# Patient Record
Sex: Male | Born: 1945
Health system: Southern US, Community
[De-identification: ages and names within clinical notes are randomized; demographics above are authoritative.]

## PROBLEM LIST (undated history)

## (undated) DIAGNOSIS — E785 Hyperlipidemia, unspecified: Secondary | ICD-10-CM

## (undated) DIAGNOSIS — E119 Type 2 diabetes mellitus without complications: Secondary | ICD-10-CM

## (undated) DIAGNOSIS — J432 Centrilobular emphysema: Secondary | ICD-10-CM

## (undated) DIAGNOSIS — H8110 Benign paroxysmal vertigo, unspecified ear: Secondary | ICD-10-CM

## (undated) DIAGNOSIS — Z794 Long term (current) use of insulin: Secondary | ICD-10-CM

## (undated) DIAGNOSIS — I10 Essential (primary) hypertension: Secondary | ICD-10-CM

## (undated) DIAGNOSIS — N451 Epididymitis: Secondary | ICD-10-CM

## (undated) DIAGNOSIS — I639 Cerebral infarction, unspecified: Secondary | ICD-10-CM

## (undated) DIAGNOSIS — F17200 Nicotine dependence, unspecified, uncomplicated: Secondary | ICD-10-CM

## (undated) HISTORY — PX: VASECTOMY: SHX75

## (undated) HISTORY — DX: Long term (current) use of insulin: Z79.4

## (undated) HISTORY — DX: Type 2 diabetes mellitus without complications: E11.9

## (undated) HISTORY — DX: Centrilobular emphysema: J43.2

## (undated) HISTORY — DX: Nicotine dependence, unspecified, uncomplicated: F17.200

## (undated) HISTORY — DX: Epididymitis: N45.1

## (undated) HISTORY — DX: Hyperlipidemia, unspecified: E78.5

## (undated) HISTORY — PX: PILONIDAL CYST EXCISION: SHX744

## (undated) HISTORY — DX: Benign paroxysmal vertigo, unspecified ear: H81.10

## (undated) HISTORY — DX: Cerebral infarction, unspecified: I63.9

---

## 2009-10-18 ENCOUNTER — Encounter: Payer: Self-pay | Admitting: Family Medicine

## 2009-11-09 DIAGNOSIS — I639 Cerebral infarction, unspecified: Secondary | ICD-10-CM

## 2009-11-09 HISTORY — DX: Cerebral infarction, unspecified: I63.9

## 2009-12-08 ENCOUNTER — Encounter: Payer: Self-pay | Admitting: Family Medicine

## 2010-01-31 ENCOUNTER — Ambulatory Visit: Payer: Self-pay | Admitting: Family Medicine

## 2010-01-31 DIAGNOSIS — E785 Hyperlipidemia, unspecified: Secondary | ICD-10-CM

## 2010-01-31 DIAGNOSIS — E119 Type 2 diabetes mellitus without complications: Secondary | ICD-10-CM | POA: Insufficient documentation

## 2010-01-31 DIAGNOSIS — E118 Type 2 diabetes mellitus with unspecified complications: Secondary | ICD-10-CM | POA: Insufficient documentation

## 2010-01-31 DIAGNOSIS — E1169 Type 2 diabetes mellitus with other specified complication: Secondary | ICD-10-CM | POA: Insufficient documentation

## 2010-01-31 DIAGNOSIS — I10 Essential (primary) hypertension: Secondary | ICD-10-CM | POA: Insufficient documentation

## 2010-01-31 DIAGNOSIS — F17200 Nicotine dependence, unspecified, uncomplicated: Secondary | ICD-10-CM | POA: Insufficient documentation

## 2010-01-31 LAB — CONVERTED CEMR LAB: Hgb A1c MFr Bld: 9.1 %

## 2010-02-01 ENCOUNTER — Telehealth: Payer: Self-pay | Admitting: Family Medicine

## 2010-02-28 ENCOUNTER — Ambulatory Visit: Payer: Self-pay | Admitting: Family Medicine

## 2010-02-28 DIAGNOSIS — Z8679 Personal history of other diseases of the circulatory system: Secondary | ICD-10-CM | POA: Insufficient documentation

## 2010-03-22 ENCOUNTER — Ambulatory Visit: Payer: Self-pay | Admitting: Family Medicine

## 2010-03-22 DIAGNOSIS — M67919 Unspecified disorder of synovium and tendon, unspecified shoulder: Secondary | ICD-10-CM | POA: Insufficient documentation

## 2010-03-22 DIAGNOSIS — M719 Bursopathy, unspecified: Secondary | ICD-10-CM

## 2010-04-15 ENCOUNTER — Telehealth: Payer: Self-pay | Admitting: Family Medicine

## 2010-04-18 ENCOUNTER — Encounter
Admission: RE | Admit: 2010-04-18 | Discharge: 2010-04-18 | Payer: Self-pay | Source: Home / Self Care | Admitting: Family Medicine

## 2010-04-18 ENCOUNTER — Ambulatory Visit: Payer: Self-pay | Admitting: Family Medicine

## 2010-04-18 DIAGNOSIS — R05 Cough: Secondary | ICD-10-CM

## 2010-05-20 ENCOUNTER — Ambulatory Visit
Admission: RE | Admit: 2010-05-20 | Discharge: 2010-05-20 | Payer: Self-pay | Source: Home / Self Care | Attending: Family Medicine | Admitting: Family Medicine

## 2010-05-20 DIAGNOSIS — H8110 Benign paroxysmal vertigo, unspecified ear: Secondary | ICD-10-CM | POA: Insufficient documentation

## 2010-05-20 HISTORY — DX: Benign paroxysmal vertigo, unspecified ear: H81.10

## 2010-05-20 LAB — HM DIABETES FOOT EXAM

## 2010-05-31 ENCOUNTER — Encounter: Payer: Self-pay | Admitting: Family Medicine

## 2010-06-04 ENCOUNTER — Ambulatory Visit
Admission: RE | Admit: 2010-06-04 | Discharge: 2010-06-04 | Payer: Self-pay | Source: Home / Self Care | Attending: Family Medicine | Admitting: Family Medicine

## 2010-06-05 ENCOUNTER — Telehealth: Payer: Self-pay | Admitting: Family Medicine

## 2010-06-13 NOTE — Progress Notes (Signed)
Summary: Med List Brought by Patient  Med List Brought by Patient   Imported By: Lanelle Bal 02/11/2010 09:57:27  _____________________________________________________________________  External Attachment:    Type:   Image     Comment:   External Document

## 2010-06-13 NOTE — Progress Notes (Signed)
Summary: Aggrenox  Phone Note Call from Patient Call back at Home Phone [phone]   Caller: Patient Call For: Nani Gasser MD Summary of Call: Aggrenox was not generic form so was gonna cost him 200 dollars. Wanted to know if there was anything else cheaper for a blood thinner. Is only on ASA 325 mg once a day. Please advise. [facility] in [place] Initial call taken by: Kathlene November LPN,  April 15, 2010 3:24 PM  Follow-up for Phone Call        Lets try runnign plavix through again ans see if any cheaper. If too expensive then will just have to stick with the ASA or go onto the drugs website adn see if qualify for help scine no insurance.  Follow-up by: Nani Gasser MD,  April 15, 2010 5:33 PM  Additional Follow-up for Phone Call Additional follow up Details #1::        Pt notified of instructions. Has tried the Plavix and it is too expensive as well. Will stick with the ASA and will check into the web site for assistance Additional Follow-up by: Kathlene November LPN,  April 16, 2010 11:32 AM    New/Updated Medications: PLAVIX 75 MG TABS (CLOPIDOGREL BISULFATE) Take 1 tablet by mouth once a day Prescriptions: PLAVIX 75 MG TABS (CLOPIDOGREL BISULFATE) Take 1 tablet by mouth once a day  #30 x 0   Entered and Authorized by:   Nani Gasser MD   Signed by:   Nani Gasser MD on 04/15/2010   Method used:   Electronically to        [facility] [phone]* (retail)       [address] [place], [place]  [address]       Ph: [phone]       Fax: [phone]   RxID:   [id number]

## 2010-06-13 NOTE — Assessment & Plan Note (Signed)
Summary: 1 MONTH HTN, DM, stroke   Vital Signs:  Patient profile:   65 year old male Height:      73 inches Weight:      220 pounds Pulse rate:   81 / minute BP sitting:   118 / 77  (right arm) Cuff size:   regular  Vitals Entered By: Avon Gully CMA, (AAMA) (February 28, 2010 11:18 AM) CC: f/u BP   CC:  f/u BP.  History of Present Illness: Here to f/u BP. ED has gotten worse in the last 6 months. Dec libido. Says wants to know what could be causing this. Says he is around less stimulating things and thinks that could be contributing.   FEels his affect is more flat since the stroke. More easily fatigued.  Not sure if depressed or not.  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   Taking his metformin two times a day and had added the glucotrol. His sugars are running 120-140 fasting in the AM.  Dong well on teh gipizide with no SE. Dnies any lows.  NO recent infections.   Saw ? Neurology for his stroke.  They had discusssion about reducing his risk of stroke.  Also he cannot afford the plavix so I had given him samples of Aggrenox at the last visit.  He is out of them.  He is hopig to find something generic in addition to his aspirin therapy.   Current Medications (verified): 1)  Metformin Hcl 1000 Mg Tabs (Metformin Hcl) .... One Tablet Two Times A Day 2)  Simvastatin 40 Mg Tabs (Simvastatin) .... One Tablet By Mouth Once A Day 3)  Lisinopril-Hydrochlorothiazide 20-12.5 Mg Tabs (Lisinopril-Hydrochlorothiazide) .... Take 1 Tablet By Mouth Once A Day 4)  Aspirin 325 Mg Tabs (Aspirin) .... Take 1 Tablet By Mouth Once A Day 5)  Glipizide 10 Mg Tabs (Glipizide) .... Take 1 Tablet By Mouth Once A Day  Allergies (verified): No Known Drug Allergies  Comments:  Nurse/Medical Assistant: The patient's medications and allergies were reviewed with the patient and were updated in the Medication and Allergy Lists. Avon Gully CMA, Duncan Dull) (February 28, 2010 11:18 AM)  Physical Exam  General:   Well-developed,well-nourished,in no acute distress; alert,appropriate and cooperative throughout examination   Impression & Recommendations:  Problem # 1:  HYPERTENSION, MILD (ICD-401.1) Much improved today. At goal.  His updated medication list for this problem includes:    Lisinopril-hydrochlorothiazide 20-12.5 Mg Tabs (Lisinopril-hydrochlorothiazide) .[name]... Take 1 tablet by mouth once a day  BP today: 118/77 Prior BP: 144/75 (01/31/2010)  Prior 10 Yr Risk Heart Disease: Not enough information (01/31/2010)  Problem # 2:  DIABETES MELLITUS (ICD-250.00) Dsicussed that his more recent numbers look gret. F/U in 2 months and hopefully A1C will look great. He says he has been much more consistant with taking his meds.  His updated medication list for this problem includes:    Metformin Hcl 1000 Mg Tabs (Metformin hcl) ..... One tablet two times a day    Lisinopril-hydrochlorothiazide 20-12.5 Mg Tabs (Lisinopril-hydrochlorothiazide) .[name]... Take 1 tablet by mouth once a day    Aspirin 325 Mg Tabs (Aspirin) .[name]... Take 1 tablet by mouth once a day    Glipizide 10 Mg Tabs (Glipizide) .[name]... Take 1 tablet by mouth once a day  Reviewed HgBA1c results: 9.1 (01/31/2010)  Problem # 3:  CEREBROVASCULAR ACCIDENT, HX OF (ICD-V12.50) Will send over generic aggrenox and see if affordable or not. Pt will let me now. Did give him more samples  for the next 2 weeks as well.   Complete Medication List: 1)  Metformin Hcl 1000 Mg Tabs (Metformin hcl) .... One tablet two times a day 2)  Simvastatin 40 Mg Tabs (Simvastatin) .... One tablet by mouth once a day 3)  Lisinopril-hydrochlorothiazide 20-12.5 Mg Tabs (Lisinopril-hydrochlorothiazide) .... Take 1 tablet by mouth once a day 4)  Aspirin 325 Mg Tabs (Aspirin) .... Take 1 tablet by mouth once a day 5)  Glipizide 10 Mg Tabs (Glipizide) .... Take 1 tablet by mouth once a day 6)  Aggrenox 25-200 Mg Xr12h-cap (Aspirin-dipyridamole) .... Take 1 tablet by mouth two  times a day  (generic please)  Patient Instructions: 1)  Will send over rx for the generic Aggrenox -blood thinner. Call if too expensive.  2)  Please schedule a follow-up appointment in 2-3 months for the diabetes.  Prescriptions: AGGRENOX 25-200 MG XR12H-CAP (ASPIRIN-DIPYRIDAMOLE) Take 1 tablet by mouth two times a day  (Generic please)  #60 x 3   Entered and Authorized by:   Nani Gasser MD   Signed by:   Nani Gasser MD on 02/28/2010   Method used:   Electronically to        [facility] [phone]* (retail)       [address] [place], [place]  [address]       Ph: [phone]       Fax: [phone]   RxID:   [id number]    Orders Added: 1)  Est. Patient Level III [24401]

## 2010-06-13 NOTE — Assessment & Plan Note (Signed)
Summary: Coughing   Vital Signs:  Patient profile:   65 year old male Height:      73 inches Weight:      229 pounds O2 Sat:      98 % Temp:     98.4 degrees F oral Pulse rate:   79 / minute BP sitting:   166 / 86  (right arm) Cuff size:   regular  Vitals Entered By: Avon Gully CMA, Duncan Dull) (April 18, 2010 1:56 PM) CC: cough x 3 weeks   CC:  cough x 3 weeks.  History of Present Illness: Cough x 3 weeks. Last night was the worst. No fever.  Has a tickle and productive.  No SOB.  Cough is very forceful and feels lightheaded. No ear pain. Mild sinus congestion. No cough or cold medication. +smoker. FElt SOB when layed down on his right side. NO alleivaitn sxs. Not really keeping him awake at night. Says gets bronchitis every 2-3 years. Has had PNA twice.   Current Medications (verified): 1)  Metformin Hcl 1000 Mg Tabs (Metformin Hcl) .... One Tablet Two Times A Day 2)  Simvastatin 40 Mg Tabs (Simvastatin) .... One Tablet By Mouth Once A Day 3)  Lisinopril-Hydrochlorothiazide 20-12.5 Mg Tabs (Lisinopril-Hydrochlorothiazide) .... Take 1 Tablet By Mouth Once A Day 4)  Aspirin 325 Mg Tabs (Aspirin) .... Take 1 Tablet By Mouth Once A Day 5)  Glipizide 10 Mg Tabs (Glipizide) .... Take 1 Tablet By Mouth Once A Day 6)  Plavix 75 Mg Tabs (Clopidogrel Bisulfate) .... Take 1 Tablet By Mouth Once A Day  Allergies (verified): No Known Drug Allergies  Comments:  Nurse/Medical Assistant: The patient's medications and allergies were reviewed with the patient and were updated in the Medication and Allergy Lists. Avon Gully CMA, Duncan Dull) (April 18, 2010 1:57 PM)  Social History: Reviewed history from 01/31/2010 and no changes required. Single.  Lives alone.  Current Smoker Alcohol use-no Drug use-no Regular exercise-no 1 cafffeinated drink per day.   Physical Exam  General:  Well-developed,well-nourished,in no acute distress; alert,appropriate and cooperative throughout  examination Head:  Normocephalic and atraumatic without obvious abnormalities. No apparent alopecia or balding. Eyes:  No corneal or conjunctival inflammation noted. EOMI. Perrla.  Ears:  External ear exam shows no significant lesions or deformities.  Right canal was blocked with cerumen.   Nose:  External nasal examination shows no deformity or inflammation. Nasal mucosa are pink and moist without lesions or exudates. Mouth:  Oral mucosa and oropharynx without lesions or exudates.  Teeth in good repair. Neck:  No deformities, masses, or tenderness noted. Chest Wall:  No deformities, masses, tenderness or gynecomastia noted. Lungs:  Normal respiratory effort, chest expands symmetrically. Left is clear. Right with rhonchi diffusely .No wheezes. Heart:  Normal rate and regular rhythm. S1 and S2 normal without gallop, murmur, click, rub or other extra sounds. Skin:  no rashes.   Cervical Nodes:  No lymphadenopathy noted Psych:  Cognition and judgment appear intact. Alert and cooperative with normal attention span and concentration. No apparent delusions, illusions, hallucinations   Impression & Recommendations:  Problem # 1:  COUGH (ICD-786.2) I am concerned about possible PNA. Will send for CXR and further evaluation. Pulse ox was normal today. Will call him back with the results. Recommend stop smoking.  Orders: T-Chest x-ray, 2 views (71020)  Complete Medication List: 1)  Metformin Hcl 1000 Mg Tabs (Metformin hcl) .... One tablet two times a day 2)  Simvastatin 40 Mg Tabs (  Simvastatin) .... One tablet by mouth once a day 3)  Lisinopril-hydrochlorothiazide 20-12.5 Mg Tabs (Lisinopril-hydrochlorothiazide) .... Take 1 tablet by mouth once a day 4)  Aspirin 325 Mg Tabs (Aspirin) .... Take 1 tablet by mouth once a day 5)  Glipizide 10 Mg Tabs (Glipizide) .... Take 1 tablet by mouth once a day 6)  Plavix 75 Mg Tabs (Clopidogrel bisulfate) .... Take 1 tablet by mouth once a day   Orders  Added: 1)  T-Chest x-ray, 2 views [71020] 2)  Est. Patient Level IV [84132]

## 2010-06-13 NOTE — Progress Notes (Signed)
Summary: meds  ---- Converted from flag ---- ---- 01/31/2010 9:08 PM, Nani Gasser MD wrote: Call pt: A1C was really high. Make sure taking metformin two times a day and sent over 2nd pill to help get sugars down to under 130 fasting.  It is on the $4 list at [facility]. ------------------------------  02/01/10 left message for pt to call back in re  above instructions.acm 9:389/23/11 pt notified.pt wants to know if you ment to give him #90 of the simvastatin and does he take this along with the aggernox? McCrimmon CMA, (AAMA), Sue Lush   September 23, 201112:30 PM Yes, the 90 tabs will last 3 months and saves him money over teh 3 month period.  Yes, take with the aggrenox Linford Arnold MD, Santina Evans.  McCrimmon CMA, Duncan Dull), Sue Lush February 01, 2010 pt notified.acm

## 2010-06-13 NOTE — Assessment & Plan Note (Signed)
Summary: 2 WK F/UP   Vital Signs:  Patient profile:   65 year old male Height:      73 inches Weight:      226 pounds Pulse rate:   92 / minute BP sitting:   119 / 76  (right arm) Cuff size:   regular  Vitals Entered By: Avon Gully CMA, Duncan Dull) (June 04, 2010 1:15 PM) CC: f/u vertigo,not much improvment   Primary Care Provider:  Nani Gasser MD  CC:  f/u vertigo and not much improvment.  History of Present Illness: Plans on restarting the plavix as is able to get it through the drug company. Wants to know if should take the ASA with  Plavix.   Went to the foot center in [place] to deteremine the degree nerve damage.  Waiting for the results.  IT will be about 3 weeks.    Says sugar this am in the 130s but admits still eating some sweets at night.   He would also like to discusse smoking cessation aain. He thinks he would like to start chantix.   still having vertigo moslty at night. Says he can re-created it when turns his head to a certain position.  Says thinks the meclzine may be making him feel worse.   Diabetes Management History:      The patient is a 64 years old male who comes in for evaluation of Type 2 Diabetes Mellitus.  He is checking home blood sugars.    Current Medications (verified): 1)  Metformin Hcl 1000 Mg Tabs (Metformin Hcl) .... One Tablet Two Times A Day 2)  Simvastatin 40 Mg Tabs (Simvastatin) .... One Tablet By Mouth Once A Day 3)  Lisinopril-Hydrochlorothiazide 20-12.5 Mg Tabs (Lisinopril-Hydrochlorothiazide) .... Take 1 Tablet By Mouth Once A Day 4)  Aspirin 325 Mg Tabs (Aspirin) .... Take 1 Tablet By Mouth Once A Day 5)  Glipizide Xl 10 Mg Xr24h-Tab (Glipizide) .... Take 1 Tablet By Mouth Once A Day 6)  Meclizine Hcl 25 Mg Tabs (Meclizine Hcl) .... Take 1 Tablet By Mouth Two Times A Day As Needed Vertigo  Allergies (verified): No Known Drug Allergies  Comments:  Nurse/Medical Assistant: The patient's medications and allergies were  reviewed with the patient and were updated in the Medication and Allergy Lists. Avon Gully CMA, Duncan Dull) (June 04, 2010 1:16 PM)  Social History: Single.  Lives alone. Exercises for 30 min daily.  Current Smoker Alcohol use-no Drug use-no Regular exercise-no 1 cafffeinated drink per day.   Physical Exam  General:  Well-developed,well-nourished,in no acute distress; alert,appropriate and cooperative throughout examination Lungs:  Normal respiratory effort, chest expands symmetrically. Lungs are clear to auscultation, no crackles or wheezes. Heart:  Normal rate and regular rhythm. S1 and S2 normal without gallop, murmur, click, rub or other extra sounds.   Impression & Recommendations:  Problem # 1:  CEREBROVASCULAR ACCIDENT, HX OF (ICD-V12.50) Wants to know if needs to take the ASA with the plavix. Will have to check with his neurologist.    Problem # 2:  TOBACCO ABUSE (ICD-305.1)  Discussed that he would like to start the chantix as well. Discussed SE. Call if any concerns. Recommend call the drug company to see if any help with the price of the medication.   His updated medication list for this problem includes:    Chantix Starting Month Pak 0.5 Mg X 11 & 1 Mg X 42 Tabs (Varenicline tartrate) .[name]... Take as directed with food and water.  Problem # 3:  DIABETES MELLITUS (ICD-250.00) Not due for next A1C but i snow following with podiatry.   His updated medication list for this problem includes:    Metformin Hcl 1000 Mg Tabs (Metformin hcl) ..... One tablet two times a day    Lisinopril-hydrochlorothiazide 20-12.5 Mg Tabs (Lisinopril-hydrochlorothiazide) .[name]... Take 1 tablet by mouth once a day    Aspirin 325 Mg Tabs (Aspirin) .[name]... Take 1 tablet by mouth once a day    Glipizide Xl 10 Mg Xr24h-tab (Glipizide) .[name]... Take 1 tablet by mouth once a day  Problem # 4:  BENIGN POSITIONAL VERTIGO (ICD-386.11) Not improving. Has been taking the meclizine adn feels it is not helping.  I recommeneded sending him for PT for further treatment.  He does not have insurance so can't do this for now. Thought I did recommend stop the meclizine since his sxs are primarly at nigth  His updated medication list for this problem includes:    Meclizine Hcl 25 Mg Tabs (Meclizine hcl) .[name]... Take 1 tablet by mouth two times a day as needed vertigo  Problem # 5:  ROTATOR CUFF SYNDROME, RIGHT (ICD-726.10)  Complete Medication List: 1)  Metformin Hcl 1000 Mg Tabs (Metformin hcl) .... One tablet two times a day 2)  Simvastatin 40 Mg Tabs (Simvastatin) .... One tablet by mouth once a day 3)  Lisinopril-hydrochlorothiazide 20-12.5 Mg Tabs (Lisinopril-hydrochlorothiazide) .... Take 1 tablet by mouth once a day 4)  Aspirin 325 Mg Tabs (Aspirin) .... Take 1 tablet by mouth once a day 5)  Glipizide Xl 10 Mg Xr24h-tab (Glipizide) .... Take 1 tablet by mouth once a day 6)  Meclizine Hcl 25 Mg Tabs (Meclizine hcl) .... Take 1 tablet by mouth two times a day as needed vertigo 7)  Chantix Starting Month Pak 0.5 Mg X 11 & 1 Mg X 42 Tabs (Varenicline tartrate) .... Take as directed with food and water.  Patient Instructions: 1)  Take the Chantix two times a day with food and water.  2)  [email or web address] 3)  Follow up around 08/19/2010 Prescriptions: CHANTIX STARTING MONTH PAK 0.5 MG X 11 & 1 MG X 42 TABS (VARENICLINE TARTRATE) Take as directed with food and water.  #1 pack x 0   Entered and Authorized by:   Nani Gasser MD   Signed by:   Nani Gasser MD on 06/04/2010   Method used:   Electronically to        [facility] [phone]* (retail)       [address] [place], [place]  [address]       Ph: [phone]       Fax: [phone]   RxID:   [id number]    Orders Added: 1)  Est. Patient Level III [27253]

## 2010-06-13 NOTE — Assessment & Plan Note (Signed)
Summary: Rt shoulder pain   Vital Signs:  Patient profile:   65 year old male Height:      73 inches Weight:      224 pounds Pulse rate:   66 / minute BP sitting:   138 / 84  (right arm) Cuff size:   regular  Vitals Entered By: Avon Gully CMA, Duncan Dull) (March 22, 2010 11:44 AM) CC: rt shoulder pain x 6-8 months   CC:  rt shoulder pain x 6-8 months.  History of Present Illness: rt shoulder pain x 6-8 months.  Unable to reach back and raise his arm.  Pain for 6-8 mo.  Has been using an OTC rub and helps some.  Pain is mostly over teh outer shoulder. Can reach behind him.  No pain at rest.  No oral meds for it.  No old injuries to that shoulder.  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  Had an plain Xray that was normal about two years ago.  At that time he was not having as much limited range of motion.  Current Medications (verified): 1)  Metformin Hcl 1000 Mg Tabs (Metformin Hcl) .... One Tablet Two Times A Day 2)  Simvastatin 40 Mg Tabs (Simvastatin) .... One Tablet By Mouth Once A Day 3)  Lisinopril-Hydrochlorothiazide 20-12.5 Mg Tabs (Lisinopril-Hydrochlorothiazide) .... Take 1 Tablet By Mouth Once A Day 4)  Aspirin 325 Mg Tabs (Aspirin) .... Take 1 Tablet By Mouth Once A Day 5)  Glipizide 10 Mg Tabs (Glipizide) .... Take 1 Tablet By Mouth Once A Day 6)  Aggrenox 25-200 Mg Xr12h-Cap (Aspirin-Dipyridamole) .... Take 1 Tablet By Mouth Two Times A Day  (Generic Please)  Allergies (verified): No Known Drug Allergies  Comments:  Nurse/Medical Assistant: The patient's medications and allergies were reviewed with the patient and were updated in the Medication and Allergy Lists. Avon Gully CMA, Duncan Dull) (March 22, 2010 11:47 AM)  Past History:  Past Medical History: Last updated: 01/31/2010 Stroke 11/2009- put on plavix but can't afford it.    Physical Exam  General:  Well-developed,well-nourished,in no acute distress; alert,appropriate and cooperative throughout  examination Msk:  right shoulder with limited range of motion and he is just able to touch his low back and has extension to about 90 degrees.  It is painful to for him to reach across the shoulder.  Left shoulder with completely normal range of motion and strength.  He does have decreased strength in the right shoulder.  He also has a positive empty can test on the right but I am unsure if this may be secondary to his chronic weakness status post stroke.  No significant swelling or erythema of the shoulder joint.  He is mildly tender over the lateral border of the  acromion   Impression & Recommendations:  Problem # 1:  ROTATOR CUFF SYNDROME, RIGHT (ICD-726.10) Assessment New likely rotator cuff syndrome based on his exam today.  Discussed conservative treatment with NSAIDs and home physical therapy.  He does not have insurance to formal physical therapy is not possible at this time.  Follow-up in 3 weeks.  If not improving consider ororthopedic referral for further evaluation. avoid heavy lifting with the shoulder.  Problem # 2:  DIABETES MELLITUS (ICD-250.00) he did have some questions today about his blood sugars.  His fastings in the morning have been running between the 130 71 40s which is still slightly above goal.  Stressed the importance of healthy diet and regular exercise and some weight loss.  He does feel he is getting low before lunch by about midmorning.  He can increase his carb intake in the morning or even a small piece of fruit. His updated medication list for this problem includes:    Metformin Hcl 1000 Mg Tabs (Metformin hcl) ..... One tablet two times a day    Lisinopril-hydrochlorothiazide 20-12.5 Mg Tabs (Lisinopril-hydrochlorothiazide) .[name]... Take 1 tablet by mouth once a day    Aspirin 325 Mg Tabs (Aspirin) .[name]... Take 1 tablet by mouth once a day    Glipizide 10 Mg Tabs (Glipizide) .[name]... Take 1 tablet by mouth once a day  Complete Medication List: 1)  Metformin Hcl 1000 Mg  Tabs (Metformin hcl) .... One tablet two times a day 2)  Simvastatin 40 Mg Tabs (Simvastatin) .... One tablet by mouth once a day 3)  Lisinopril-hydrochlorothiazide 20-12.5 Mg Tabs (Lisinopril-hydrochlorothiazide) .... Take 1 tablet by mouth once a day 4)  Aspirin 325 Mg Tabs (Aspirin) .... Take 1 tablet by mouth once a day 5)  Glipizide 10 Mg Tabs (Glipizide) .... Take 1 tablet by mouth once a day 6)  Aggrenox 25-200 Mg Xr12h-cap (Aspirin-dipyridamole) .... Take 1 tablet by mouth two times a day  (generic please)   Patient Instructions: 1)  Ibuprofen 600mg  by mouth three times a day for one week, then as needed. Make sure take with with foods and water.  Stop immediately if any stomach irritation 2)  Try the home physical therapy.  Follow up in about 3 weeks.    Orders Added: 1)  Est. Patient Level IV [16109]

## 2010-06-13 NOTE — Assessment & Plan Note (Signed)
Summary: DM, Vertigo   Vital Signs:  Patient profile:   65 year old male Height:      73 inches Weight:      227 pounds Pulse rate:   83 / minute BP sitting:   139 / 83  (right arm) Cuff size:   regular  Vitals Entered By: Avon Gully CMA, Duncan Dull) (May 20, 2010 9:25 AM) CC: f/u diabetes CBG Result 228   Primary Care Provider:  Nani Gasser MD  CC:  f/u diabetes.  History of Present Illness: Says he feels dizzy today. He says had 3 bites of a biscuit this AM.  Says ate 3 bites of a bisuits on the way here.  When woke up head was spinning. Worse than has every had it.  Would turn head and it would seem like he was still spinningl.  Slight feeling of nausea.  Yesterday was his birthday and did have ice cream cake. No URI in the last week. No ear pain or pressure.    Diabetes Management History:      The patient is a 65 years old male who comes in for evaluation of DM Type 2.  He states understanding of dietary principles but he is not following the appropriate diet.  Sensory loss is noted.  Self foot exams are being performed.  He is checking home blood sugars.        Frequency of hypoglycemic symptoms are reported to be occasionally.  No hyperglycemic symptoms are reported.  Other comments include: NO regular exericse. .        There are no symptoms to suggest diabetic complications.  Since his last visit, no infections have occurred.  No changes have been made to his treatment plan since last visit.    Current Medications (verified): 1)  Metformin Hcl 1000 Mg Tabs (Metformin Hcl) .... One Tablet Two Times A Day 2)  Simvastatin 40 Mg Tabs (Simvastatin) .... One Tablet By Mouth Once A Day 3)  Lisinopril-Hydrochlorothiazide 20-12.5 Mg Tabs (Lisinopril-Hydrochlorothiazide) .... Take 1 Tablet By Mouth Once A Day 4)  Aspirin 325 Mg Tabs (Aspirin) .... Take 1 Tablet By Mouth Once A Day 5)  Glipizide 10 Mg Tabs (Glipizide) .... Take 1 Tablet By Mouth Once A Day 6)  Proair Hfa  108 (90 Base) Mcg/act Aers (Albuterol Sulfate) .... 2 Puffs Inhaled Every 4-6 Hours As Needed Shortness of Breath.  Allergies (verified): No Known Drug Allergies  Comments:  Nurse/Medical Assistant: The patient's medications and allergies were reviewed with the patient and were updated in the Medication and Allergy Lists. Avon Gully CMA, Duncan Dull) (May 20, 2010 9:26 AM)  Social History: Reviewed history from 01/31/2010 and no changes required. Single.  Lives alone.  Current Smoker Alcohol use-no Drug use-no Regular exercise-no 1 cafffeinated drink per day.   Physical Exam  General:  Well-developed,well-nourished,in no acute distress; alert,appropriate and cooperative throughout examination Head:  Normocephalic and atraumatic without obvious abnormalities. No apparent alopecia or balding. Eyes:  No corneal or conjunctival inflammation noted. EOMI. Perrla.  Ears:  External ear exam shows no significant lesions or deformities.  Otoscopic examination reveals clear canals, tympanic membranes are intact bilaterally without bulging, retraction, inflammation or discharge. Hearing is grossly normal bilaterally. Nose:  External nasal examination shows no deformity or inflammation. Nasal mucosa are pink and moist without lesions or exudates. Mouth:  Oral mucosa and oropharynx without lesions or exudates.  Teeth in good repair. Neck:  No deformities, masses, or tenderness noted. Lungs:  Normal  respiratory effort, chest expands symmetrically. Lungs are clear to auscultation, no crackles or wheezes. Heart:  Normal rate and regular rhythm. S1 and S2 normal without gallop, murmur, click, rub or other extra sounds. Neurologic:  alert & oriented X3, cranial nerves II-XII intact, gait normal, and DTRs symmetrical and normal.  + Diks-Halpike to the left.    Diabetes Management Exam:    Foot Exam (with socks and/or shoes not present):       Sensory-Monofilament:          Left foot: normal           Right foot: normal   Impression & Recommendations:  Problem # 1:  DIABETES MELLITUS (ICD-250.00)  Improved but not yet at goal. We discussed starting insulin or adjusting his diet. He says he knows he could really do a much better job on his diet.  Also says when takes the glipizide will get a low in about 4 hours later so has been trying to change the time he takes the dose.   Pt agrees to really work on his eatting habits.  F/U in one month.. Will chagne the gliipizide to the ER so hopefully prvent lows. Needs to bring in meter with him. Due for microalbumin.  Due for eye exam but doesn't have insurance.  His updated medication list for this problem includes:    Metformin Hcl 1000 Mg Tabs (Metformin hcl) ..... One tablet two times a day    Lisinopril-hydrochlorothiazide 20-12.5 Mg Tabs (Lisinopril-hydrochlorothiazide) .[name]... Take 1 tablet by mouth once a day    Aspirin 325 Mg Tabs (Aspirin) .[name]... Take 1 tablet by mouth once a day    Glipizide Xl 10 Mg Xr24h-tab (Glipizide) .[name]... Take 1 tablet by mouth once a day  Orders: Fingerstick (36416) Hemoglobin A1C (83036) Glucose, (CBG) (19147)  Problem # 2:  BENIGN POSITIONAL VERTIGO (ICD-386.11) Assessment: New Discussed dx and given H.O. Reviewed exercises. Given rx for meclizine for sxs relief. If not improving in 2-3 weeks then needs f/u.  His updated medication list for this problem includes:    Meclizine Hcl 25 Mg Tabs (Meclizine hcl) .[name]... Take 1 tablet by mouth two times a day as needed vertigo  Complete Medication List: 1)  Metformin Hcl 1000 Mg Tabs (Metformin hcl) .... One tablet two times a day 2)  Simvastatin 40 Mg Tabs (Simvastatin) .... One tablet by mouth once a day 3)  Lisinopril-hydrochlorothiazide 20-12.5 Mg Tabs (Lisinopril-hydrochlorothiazide) .... Take 1 tablet by mouth once a day 4)  Aspirin 325 Mg Tabs (Aspirin) .... Take 1 tablet by mouth once a day 5)  Glipizide Xl 10 Mg Xr24h-tab (Glipizide) .... Take 1 tablet  by mouth once a day 6)  Proair Hfa 108 (90 Base) Mcg/act Aers (Albuterol sulfate) .... 2 puffs inhaled every 4-6 hours as needed shortness of breath. 7)  Meclizine Hcl 25 Mg Tabs (Meclizine hcl) .... Take 1 tablet by mouth two times a day as needed vertigo   Patient Instructions: 1)  Please schedule a follow-up appointment in 1 months with glucometer.  2)  I changed your glipizide to the extended release. 3)  Start your exercises today for your vertigo.  4)  Follow up in 2 weeks if you are not better.   Prescriptions: GLIPIZIDE XL 10 MG XR24H-TAB (GLIPIZIDE) Take 1 tablet by mouth once a day  #30 x 1   Entered and Authorized by:   Nani Gasser MD   Signed by:   Nani Gasser MD on 05/20/2010  Method used:   Electronically to        [facility] [phone]* (retail)       [address] [place], [place]  [address]       Ph: [phone]       Fax: [phone]   RxID:   [id number] MECLIZINE HCL 25 MG TABS (MECLIZINE HCL) Take 1 tablet by mouth two times a day as needed vertigo  #40 x 0   Entered and Authorized by:   Nani Gasser MD   Signed by:   Nani Gasser MD on 05/20/2010   Method used:   Electronically to        [facility] [phone]* (retail)       [address] [place], [place]  [address]       Ph: [phone]       Fax: [phone]   RxID:   [id number]    Orders Added: 1)  Fingerstick [36416] 2)  Hemoglobin A1C [83036] 3)  Glucose, (CBG) [82962] 4)  Est. Patient Level IV [10932]    Laboratory Results   Blood Tests   Date/Time Received: 05/20/09 Date/Time Reported: 05/20/09  HGBA1C: 7.5%   (Normal Range: Non-Diabetic - 3-6%   Control Diabetic - 6-8%) CBG Random:: 228mg /dL         Contraindications/Deferment of Procedures/Staging:    Test/Procedure: FLU VAX    Reason for deferment: patient declined

## 2010-06-13 NOTE — Assessment & Plan Note (Signed)
Summary: NOV: HTN, lipids, DM   Vital Signs:  Patient profile:   65 year old male Height:      73 inches Weight:      224 pounds BMI:     29.66 Pulse rate:   80 / minute BP sitting:   144 / 75  (right arm) Cuff size:   regular  Vitals Entered By: Avon Gully CMA, Duncan Dull) (January 31, 2010 2:03 PM) CC: NP est care--needs med refilled, Hypertension Management   CC:  NP est care--needs med refilled and Hypertension Management.  History of Present Illness: Dx with DM 8 years ago.  Then in the [employer] trial. Was able to get off his meds for about a year. Then about a year ago had elevated sugars. Also dx wiht HTN about a year ago. Had  a stroke in july.  Right leg is weaker by about 50%, right arm strength dec by 10-20%. Still gets some numbness in that right arm. Had been on daily ASA therapy when he had the stroke.  They added plavix but he is unable to afford it.  Last A1C in teh 7 range in July. Admits he almost never remembers his second dose of metformin and admits he does't eat well exercise.  Says he has never had an A1C below 7.0. No recent CP or SOB.   Right shoulder pain for months. Had a normal  xray. Painful to reach back or reach above his head. Pain is centered over the lateral shoulder.    Diabetes Management History:      The patient is a 65 years old male who comes in for evaluation of DM Type 2.  He states understanding of dietary principles and is following his diet appropriately.        Hypoglycemic symptoms are not occurring.  Frequency of hyperglycemic symptoms are reported to be occasionally.  Other comments include: Sugars 15-190s. Says often forgets his evening dose of teh metformin. .        There are no symptoms to suggest diabetic complications.  No changes have been made to his treatment plan since last visit.    Hypertension History:      He denies headache, chest pain, palpitations, dyspnea with exertion, orthopnea, PND, peripheral edema, visual symptoms,  neurologic problems, syncope, and side effects from treatment.  Monday Bp wasin the 170s. .        Positive major cardiovascular risk factors include male age 43 years old or older, diabetes, hyperlipidemia, hypertension, and current tobacco user.     Habits & Providers  Alcohol-Tobacco-Diet     Alcohol drinks/day: 0     Tobacco Status: current     Cigarette Packs/Day: 1.0     Pack years: 45 years  Exercise-Depression-Behavior     STD Risk: never     Drug Use: no  Current Medications (verified): 1)  Metformin Hcl 1000 Mg Tabs (Metformin Hcl) .... One Tablet Two Times A Day 2)  Simvastatin 40 Mg Tabs (Simvastatin) .... One Tablet By Mouth Once A Day 3)  Lisinopril 10 Mg Tabs (Lisinopril) .... One Tablet By Mouth Once Aday  Allergies (verified): No Known Drug Allergies  Comments:  Nurse/Medical Assistant: The patient's medications and allergies were reviewed with the patient and were updated in the Medication and Allergy Lists. Avon Gully CMA, Duncan Dull) (January 31, 2010 2:08 PM)  Past History:  Past Medical History: Stroke 11/2009- put on plavix but can't afford it.    Past Surgical History: Pilonidal cyst  [id number]  Family History: DM  Social History: Single.  Lives alone.  Current Smoker Alcohol use-no Drug use-no Regular exercise-no 1 cafffeinated drink per day.  Smoking Status:  current Packs/Day:  1.0 STD Risk:  never Drug Use:  no  Review of Systems       No fever/sweats/weakness, unexplained weight loss/gain.  No vison changes.  No difficulty hearing/ringing in ears, hay fever/allergies.  No chest pain/discomfort, palpitations.  No Br lump/nipple discharge.  No cough/wheeze.  No blood in BM, nausea/vomiting/diarrhea.  No nighttime urination, leaking urine, unusual vaginal bleeding, discharge (penis or vagina).  No muscle/joint pain. No rash, change in mole.  No HA, memory loss.  No anxiety, sleep d/o, depression.  No easy bruising/bleeding, unexplained  lump   Physical Exam  General:  Well-developed,well-nourished,in no acute distress; alert,appropriate and cooperative throughout examination Head:  Normocephalic and atraumatic without obvious abnormalities. No apparent alopecia or balding. Neck:  No deformities, masses, or tenderness noted. No TM.  Lungs:  Normal respiratory effort, chest expands symmetrically. Lungs are clear to auscultation, no crackles or wheezes. Heart:  Normal rate and regular rhythm. S1 and S2 normal without gallop, murmur, click, rub or other extra sounds. No carotid bruits .  Pulses:  Radial 2+  Skin:  color normal.   Cervical Nodes:  No lymphadenopathy noted Psych:  Cognition and judgment appear intact. Alert and cooperative with normal attention span and concentration. No apparent delusions, illusions, hallucinations   Impression & Recommendations:  Problem # 1:  HYPERTENSION, MILD (ICD-401.1) Change to lisinopril with HCT.  Recheck in 1 month. Call if any concerns.  His updated medication list for this problem includes:    Lisinopril-hydrochlorothiazide 20-12.5 Mg Tabs (Lisinopril-hydrochlorothiazide) .[name]... Take 1 tablet by mouth once a day  BP today: 144/75  10 Yr Risk Heart Disease: Not enough information  Problem # 2:  HYPERLIPIDEMIA (ICD-272.4) Says had labs done in June. Will check his papers.   His updated medication list for this problem includes:    Simvastatin 40 Mg Tabs (Simvastatin) ..... One tablet by mouth once a day  Problem # 3:  DIABETES MELLITUS (ICD-250.00)  Not well controlled clearly.  Rec change to metformin XL but he really wanted to try adn make an effort to take his two times a day dose. Will add glipizide becuase of cost issues.  Also strongly enc weight loss, exercise, and healthy consistant diet. Continue daily ASA Will get labs to see what he is due for.  Declined flu vac secondary to no insurance.  His updated medication list for this problem includes:    Metformin Hcl  1000 Mg Tabs (Metformin hcl) ..... One tablet two times a day    Lisinopril-hydrochlorothiazide 20-12.5 Mg Tabs (Lisinopril-hydrochlorothiazide) .[name]... Take 1 tablet by mouth once a day    Aspirin 325 Mg Tabs (Aspirin) .[name]... Take 1 tablet by mouth once a day    Glipizide 10 Mg Tabs (Glipizide) .[name]... Take 1 tablet by mouth once a day  Orders: Fingerstick (36416) Hgb A1C (47829FA)  Reviewed HgBA1c results: 9.1 (01/31/2010)  Problem # 4:  TOBACCO ABUSE (ICD-305.1) Contemplative stage.   Complete Medication List: 1)  Metformin Hcl 1000 Mg Tabs (Metformin hcl) .... One tablet two times a day 2)  Simvastatin 40 Mg Tabs (Simvastatin) .... One tablet by mouth once a day 3)  Lisinopril-hydrochlorothiazide 20-12.5 Mg Tabs (Lisinopril-hydrochlorothiazide) .... Take 1 tablet by mouth once a day 4)  Aspirin 325 Mg Tabs (Aspirin) .... Take 1 tablet by mouth once  a day 5)  Glipizide 10 Mg Tabs (Glipizide) .... Take 1 tablet by mouth once a day  Hypertension Assessment/Plan:      The patient's hypertensive risk group is category C: Target organ damage and/or diabetes.  Today's blood pressure is 144/75.    Patient Instructions: 1)  Take your metformin two times a day and make sure getting regular exercise daily. 2)  Followup in one month 3)  I encourage you to get the flu shot 4)  I changed your BP medication. See below.  5)  Take the Aggrenox samples. oNe a day. Hold your Aspirin as it already has aspirin in it.  Prescriptions: GLIPIZIDE 10 MG TABS (GLIPIZIDE) Take 1 tablet by mouth once a day  #30 x 2   Entered and Authorized by:   Nani Gasser MD   Signed by:   Nani Gasser MD on 02/01/2010   Method used:   Electronically to        [facility] [phone]* (retail)       [address] [place], [place]  [address]       Ph: [phone]       Fax: [phone]   RxID:   [id number] LISINOPRIL-HYDROCHLOROTHIAZIDE 20-12.5 MG TABS (LISINOPRIL-HYDROCHLOROTHIAZIDE) Take 1 tablet  by mouth once a day  #30 x 1   Entered and Authorized by:   Nani Gasser MD   Signed by:   Nani Gasser MD on 01/31/2010   Method used:   Electronically to        [facility] [phone]* (retail)       [address] [place], [place]  [address]       Ph: [phone]       Fax: [phone]   RxID:   [id number] SIMVASTATIN 40 MG TABS (SIMVASTATIN) one tablet by mouth once a day  #90 x 3   Entered and Authorized by:   Nani Gasser MD   Signed by:   Nani Gasser MD on 01/31/2010   Method used:   Electronically to        [facility] [phone]* (retail)       [address] [place], [place]  [address]       Ph: [phone]       Fax: [phone]   RxID:   [id number] METFORMIN HCL 1000 MG TABS (METFORMIN HCL) one tablet two times a day  #60 x 3   Entered and Authorized by:   Nani Gasser MD   Signed by:   Nani Gasser MD on 01/31/2010   Method used:   Electronically to        [facility] [phone]* (retail)       [address] [place], [place]  [address]       Ph: [phone]       Fax: [phone]   RxID:   [id number]    Laboratory Results   Blood Tests   Date/Time Received: 01/31/10 Date/Time Reported: 01/31/10  HGBA1C: 9.1%   (Normal Range: Non-Diabetic - 3-6%   Control Diabetic - 6-8%)         Immunization History:  Pneumovax Immunization History:    Pneumovax:  historical (03/15/2009)  Influenza Immunization History:    Influenza:  historical (04/14/2009)

## 2010-06-19 NOTE — Progress Notes (Signed)
Summary: Plavix  ---- Converted from flag ---- ---- 06/04/2010 3:48 PM, Nani Gasser MD wrote: Call his neurologis and see if needs ASA + Plavix. He is gong to be able to finally get the plavix throught the drug company ------------------------------  06/05/10 9:54 acm called pt to ask who his neuro was an d he will call me back with this info  10:36 AM June 07, 2010 McCrimmon CMA, Duncan Dull), Sue Lush called and left a message and asked that they call back  [phone] Brantley Persons neurologist that this pt sees  9:02 AM June 11, 2010 McCrimmon CMA, Duncan Dull), Sue Lush called and discharge physician was Hassan Buckler, MD, Marylu Lund the nurse will call me back  10:36 AM June 11, 2010 McCrimmon CMA, Duncan Dull), Sue Lush per Dr. Hassan Buckler pt does not need to be on asa, just plavix   January 31, 201212:36 PM [facility]. Call pt and let him know stop the ASA and just take the plavix. Linford Arnold MD, Keiandre Cygan  1:36 PM June 11, 2010 McCrimmon CMA, Duncan Dull), Sue Lush pt notified

## 2010-06-27 NOTE — Letter (Signed)
Summary: [facility]  [facility]   Imported By: Maryln Gottron 06/19/2010 14:40:47  _____________________________________________________________________  External Attachment:    Type:   Image     Comment:   External Document

## 2010-07-16 ENCOUNTER — Encounter: Payer: Self-pay | Admitting: Family Medicine

## 2010-07-30 ENCOUNTER — Telehealth: Payer: Self-pay | Admitting: Family Medicine

## 2010-08-05 ENCOUNTER — Other Ambulatory Visit: Payer: Self-pay | Admitting: Family Medicine

## 2010-08-08 ENCOUNTER — Other Ambulatory Visit: Payer: Self-pay | Admitting: *Deleted

## 2010-08-08 DIAGNOSIS — E119 Type 2 diabetes mellitus without complications: Secondary | ICD-10-CM

## 2010-08-08 MED ORDER — GLUCOSE BLOOD VI STRP: ORAL_STRIP | Status: DC

## 2010-08-19 ENCOUNTER — Ambulatory Visit: Payer: Self-pay | Admitting: Family Medicine

## 2010-08-20 ENCOUNTER — Encounter: Payer: Self-pay | Admitting: Family Medicine

## 2010-08-20 ENCOUNTER — Ambulatory Visit (INDEPENDENT_AMBULATORY_CARE_PROVIDER_SITE_OTHER): Payer: Self-pay | Admitting: Family Medicine

## 2010-08-20 ENCOUNTER — Telehealth: Payer: Self-pay | Admitting: Family Medicine

## 2010-08-20 DIAGNOSIS — E119 Type 2 diabetes mellitus without complications: Secondary | ICD-10-CM

## 2010-08-20 LAB — POCT UA - MICROALBUMIN

## 2010-08-20 NOTE — Assessment & Plan Note (Addendum)
Not much improvemen in A1Ct.  Discussed he really needs to start injection either insulin byetta, etc. He really doesn't want to do this.  Asked him to f/u in 3 months.  Microalbumin was neg today.   Lab Results  Component Value Date   HGBA1C 7.4 08/20/2010

## 2010-08-20 NOTE — Telephone Encounter (Signed)
Continue use work on weight loss and regular exercise and your diet.

## 2010-08-20 NOTE — Patient Instructions (Addendum)
  Work on weight loss, exercise, and diet Fasting sugar in the morning before breakfast.

## 2010-08-20 NOTE — Progress Notes (Signed)
  Subjective:    Patient ID: Jon [name], male    DOB: Sep 17, 1945, 65 y.o.   MRN: [id number]  HPI Says he still needs to work on his diet.  Still eating sweets. He has not been checking his sugars fasting. Since her last time.  Has numbness in both legs.  Saw podiatry who recommended a neurology referral.  He says he can't afford this at this time. He did bring a copy of the muscle bx report with him to scan in .   S/o residual weakness from his stroke.  He is still smoking.    Review of Systems     Objective:   Physical Exam  Constitutional: He appears well-developed and well-nourished.  HENT:  Head: Normocephalic.  Cardiovascular: Normal rate, regular rhythm and normal heart sounds.        No carotid bruits.   Pulmonary/Chest: Effort normal and breath sounds normal.  Musculoskeletal: He exhibits no edema.          Assessment & Plan:

## 2010-08-21 NOTE — Telephone Encounter (Signed)
Left message on vm

## 2010-09-04 ENCOUNTER — Telehealth: Payer: Self-pay | Admitting: Family Medicine

## 2010-09-04 LAB — COMPLETE METABOLIC PANEL WITH GFR
AST: 14 U/L (ref 0–37)
Albumin: 4.5 g/dL (ref 3.5–5.2)
BUN: 14 mg/dL (ref 6–23)
CO2: 27 mEq/L (ref 19–32)
Calcium: 8.6 mg/dL (ref 8.4–10.5)
Creat: 1.33 mg/dL (ref 0.40–1.50)
GFR, Est African American: >60 mL/min (ref >60)
GFR, Est Non African American: 54 mL/min — ABNORMAL LOW (ref >60)
Glucose, Bld: 128 mg/dL — ABNORMAL HIGH (ref 70–99)
Potassium: 4.7 mEq/L (ref 3.5–5.3)
Sodium: 139 mEq/L (ref 135–145)

## 2010-09-04 LAB — LIPID PANEL
HDL: 34 mg/dL — ABNORMAL LOW (ref >39)
Total CHOL/HDL Ratio: 4.3 Ratio
Triglycerides: 293 mg/dL — ABNORMAL HIGH (ref <150)

## 2010-09-04 NOTE — Telephone Encounter (Signed)
LDL looks great but the triglycerides are still elevated. If he can take fish oil like him to start taking 3 for official times a day if he cannot please let me know and we can add any other prescription medication to help lower this.

## 2010-09-04 NOTE — Telephone Encounter (Signed)
Pt.notified

## 2010-09-11 ENCOUNTER — Other Ambulatory Visit: Payer: Self-pay | Admitting: *Deleted

## 2010-09-11 ENCOUNTER — Telehealth: Payer: Self-pay | Admitting: Family Medicine

## 2010-09-11 MED ORDER — GLIPIZIDE 10 MG PO TABS: 10.0000 mg | ORAL_TABLET | ORAL | Status: DC

## 2010-09-11 NOTE — Telephone Encounter (Signed)
Pt called wanting to know what can he take OTC for pain?  Is Tylenol okay?  Needs for arthritis hurting him really bad.  Please advise. Plan:  Routed to provider. Jon Newcomer, LPN Domingo Dimes

## 2010-09-12 ENCOUNTER — Other Ambulatory Visit: Payer: Self-pay | Admitting: Family Medicine

## 2010-09-12 MED ORDER — GLIPIZIDE 10 MG PO TABS: 10.0000 mg | ORAL_TABLET | Freq: Every day | ORAL | Status: DC

## 2010-09-12 NOTE — Telephone Encounter (Signed)
Yes, can take Tyelnol arthritis for pain relief of joints.

## 2010-09-12 NOTE — Telephone Encounter (Signed)
Pt notified to try OTC Tylenol arthritis for his arthritic pain.  Wanted to know if glucosamine was okay, and I told him it is also for the joints but more on the vitamin side, but that it should be okay too. PLAN:  Pt informed, voiced understanding. Jon Newcomer, LPN Domingo Dimes

## 2010-11-16 ENCOUNTER — Other Ambulatory Visit: Payer: Self-pay | Admitting: Family Medicine

## 2010-11-17 NOTE — Telephone Encounter (Signed)
Pt needs an appt since due for 3 mth follow up diabetes NOW

## 2010-11-26 ENCOUNTER — Encounter: Payer: Self-pay | Admitting: Family Medicine

## 2010-11-26 ENCOUNTER — Ambulatory Visit (INDEPENDENT_AMBULATORY_CARE_PROVIDER_SITE_OTHER): Payer: Self-pay | Admitting: Family Medicine

## 2010-11-26 VITALS — BP 100/66 | HR 79 | Ht 74.0 in | Wt 221.0 lb

## 2010-11-26 DIAGNOSIS — I10 Essential (primary) hypertension: Secondary | ICD-10-CM

## 2010-11-26 DIAGNOSIS — E119 Type 2 diabetes mellitus without complications: Secondary | ICD-10-CM

## 2010-11-26 DIAGNOSIS — Z1211 Encounter for screening for malignant neoplasm of colon: Secondary | ICD-10-CM

## 2010-11-26 LAB — POCT UA - MICROALBUMIN
Albumin/Creatinine Ratio, Urine, POC: <30
Microalbumin Ur, POC: 10 mg/dL

## 2010-11-26 LAB — POCT GLYCOSYLATED HEMOGLOBIN (HGB A1C): Hemoglobin A1C: 6.5

## 2010-11-26 MED ORDER — LISINOPRIL 10 MG PO TABS: 10.0000 mg | ORAL_TABLET | Freq: Every day | ORAL | Status: DC

## 2010-11-26 NOTE — Progress Notes (Signed)
  Subjective:    Patient ID: Jon [name], male    DOB: 11-21-1945, 65 y.o.   MRN: [id number]  Hypertension This is a chronic problem. The current episode started more than 1 year ago. The problem is controlled. Pertinent negatives include no chest pain or shortness of breath. (Fatigue, dizzy ) There are no associated agents to hypertension. Risk factors for coronary artery disease include no known risk factors. Past treatments include ACE inhibitors and diuretics. The current treatment provides moderate improvement. There are no compliance problems.   Diabetes He presents for his follow-up diabetic visit. He has type 2 diabetes mellitus. His disease course has been improving. Associated symptoms include weakness. Pertinent negatives for diabetes include no chest pain. There are no hypoglycemic complications. Symptoms are stable. There are no diabetic complications. Current diabetic treatment includes oral agent (monotherapy). He is compliant with treatment all of the time. His weight is stable. His breakfast blood glucose range is generally 110-130 mg/dl.   he says he still occasionally eats too many sweets but says overall his pain control and is a little better. He has been too fatigued and has not got any real exercise lately.    Review of Systems  Respiratory: Negative for shortness of breath.   Cardiovascular: Negative for chest pain.  Neurological: Positive for weakness.       Objective:   Physical Exam  Constitutional: He is oriented to person, place, and time. He appears well-developed and well-nourished.  HENT:  Head: Normocephalic.  Cardiovascular: Normal rate, regular rhythm and normal heart sounds.   Pulmonary/Chest: Effort normal and breath sounds normal.  Neurological: He is alert and oriented to person, place, and time.  Skin: Skin is warm.  Psychiatric: He has a normal mood and affect. His behavior is normal.          Assessment & Plan:  Fatigue-I think this could  be coming from his low blood pressure. We will decrease his medications and I want him to followup in one month for repeat blood pressure check. If it is still at that point and we can check blood work to rule out other causes. Next  Colon cancer screening-discussed that he really needs a colonoscopy. He has never had one. He does have some assistance at [facility] with his finances so we will try to schedule at [facility].

## 2010-11-26 NOTE — Assessment & Plan Note (Addendum)
A1C looks fantastic today. Continue the metformin.  I think placed on a better job with his diet. I would still like to get him exercising more regularly. Followup in 3 months

## 2010-11-26 NOTE — Patient Instructions (Addendum)
We will get your scheduled for a colonoscopy at [facility].  Follow up in 1 month to repeat your blood pressure.

## 2010-11-26 NOTE — Assessment & Plan Note (Signed)
Blood pressure is actually low today so I'm going to decrease his medication. Will recheck in one month.

## 2010-12-02 ENCOUNTER — Ambulatory Visit (INDEPENDENT_AMBULATORY_CARE_PROVIDER_SITE_OTHER): Payer: Self-pay | Admitting: Family Medicine

## 2010-12-02 ENCOUNTER — Telehealth: Payer: Self-pay | Admitting: *Deleted

## 2010-12-02 VITALS — BP 110/69 | HR 67

## 2010-12-02 DIAGNOSIS — I10 Essential (primary) hypertension: Secondary | ICD-10-CM

## 2010-12-02 NOTE — Telephone Encounter (Signed)
Pt notified of Dr. Vernie Ammons

## 2010-12-02 NOTE — Progress Notes (Signed)
  Subjective:    Patient ID: Jon [name], male    DOB: [birth year], 65 y.o.   MRN: [id number]  HPI  Pt denies chest pain, SOB, dizziness, or heart palpitations.  Taking meds as directed w/o problems.  Denies medication side effects.  5 min spent with pt.    Review of Systems     Objective:   Physical Exam        Assessment & Plan:  HTN- BP looks great today. Continue current regimen. I think the decrease on the med is working well. Keep followup for diabetes

## 2010-12-27 ENCOUNTER — Encounter: Payer: Self-pay | Admitting: Family Medicine

## 2010-12-27 ENCOUNTER — Ambulatory Visit (INDEPENDENT_AMBULATORY_CARE_PROVIDER_SITE_OTHER): Payer: Self-pay | Admitting: Family Medicine

## 2010-12-27 VITALS — BP 123/71 | HR 71 | Wt 225.0 lb

## 2010-12-27 DIAGNOSIS — R21 Rash and other nonspecific skin eruption: Secondary | ICD-10-CM

## 2010-12-27 DIAGNOSIS — Z872 Personal history of diseases of the skin and subcutaneous tissue: Secondary | ICD-10-CM

## 2010-12-27 DIAGNOSIS — I10 Essential (primary) hypertension: Secondary | ICD-10-CM

## 2010-12-27 MED ORDER — NYSTATIN 100000 UNIT/GM EX OINT: TOPICAL_OINTMENT | Freq: Two times a day (BID) | CUTANEOUS | Status: DC

## 2010-12-27 NOTE — Progress Notes (Signed)
  Subjective:    Patient ID: Jon [name], male    DOB: December 31, 1945, 65 y.o.   MRN: [id number]  HPI He is having pain over the top part of the products along the crease. No drainage that he is noticed. It is very tender it has been getting worse this week. It started about a week ago. No fevers or chills. Hx of pilonidal cyst about 50 years ago.  In fact he had surgery for excision in 1960, 61, 62, 63.  He did well for 50 years until this past week. He has not been using any topical treatments.  No alleviating symptoms. Touching it or sitting back on something makes it worse  HTN- 05 and has hypertension. He has been taking his medications regularly. He brought his own machine and brought in to compare to ours. His blood pressures have been running him in the 150s at home. In fact here today is running in the 150s compared to a blood pressure check of 123/71.  Review of Systems     Objective:   Physical Exam  Constitutional: He appears well-developed and well-nourished.  HENT:  Head: Normocephalic and atraumatic.  Cardiovascular: Normal rate, regular rhythm and normal heart sounds.   Pulmonary/Chest: Effort normal and breath sounds normal.  Musculoskeletal: He exhibits no edema.  Skin:       Along the upper part of the buttock crease he has erythematous well-demarcated rash with some ulcerated areas towards the center. I am unable to identify any actual pleural or cyst opening. No active drainage. There is significant induration. It is tender to touch.          Assessment & Plan:  Rash- I recommend treating with topical nystatin ointment at this point in time. This also will work as a barrier ointment. I will make a referral to Gen. surgery for next weeks to examine the area to make sure it's not a recurrence of his pilonidal cyst. If the pain and rash is completely resolved by next week then we can cancel the appointment.

## 2010-12-27 NOTE — Assessment & Plan Note (Signed)
His blood pressure looks fantastic today. Unfortunately his new cuff that he bought is not working well. I recommend that since he still has received a ticket back to the pharmacy and get his money back and she is a different machine. He is welcome to come in any time and check his machine against others to make sure it is accurate. We will continue his current regimen and I will see him back in  6 months.

## 2010-12-27 NOTE — Patient Instructions (Signed)
Blood pressure looks great today Apply the nystatin cream and we will schedule you with the surgeon for next week.  If it completely resolves then we can cancel the appointment.   Can schedule nurse visit to bring in new BP cuff.  Your BP looks great today.

## 2011-01-01 ENCOUNTER — Other Ambulatory Visit: Payer: Self-pay | Admitting: Family Medicine

## 2011-01-07 ENCOUNTER — Other Ambulatory Visit: Payer: Self-pay | Admitting: Family Medicine

## 2011-01-07 ENCOUNTER — Ambulatory Visit (INDEPENDENT_AMBULATORY_CARE_PROVIDER_SITE_OTHER): Payer: Self-pay | Admitting: Surgery

## 2011-01-07 MED ORDER — CLOPIDOGREL BISULFATE 75 MG PO TABS: 75.0000 mg | ORAL_TABLET | Freq: Every day | ORAL | Status: DC

## 2011-01-07 NOTE — Telephone Encounter (Signed)
Pt called in and said we forgot to give him a script for his plavix 75 mg.  Plan:  Chart file reviewed.  The order states once.  Verified with Dr. Linford Arnold that this is suppose to read plavix 75 mg PO daily.  Verified and okay to send for daily use.  Pt informed that the script was sent. Jon Newcomer, LPN [facility]

## 2011-02-03 ENCOUNTER — Ambulatory Visit (INDEPENDENT_AMBULATORY_CARE_PROVIDER_SITE_OTHER): Payer: Self-pay | Admitting: Family Medicine

## 2011-02-03 DIAGNOSIS — I10 Essential (primary) hypertension: Secondary | ICD-10-CM

## 2011-02-03 NOTE — Progress Notes (Signed)
  Subjective:    Patient ID: Jon [name], male    DOB: 1945/11/28, 65 y.o.   MRN: [id number] Pt here for nurse BP check. No complaints of chest pain, dizziness. Pt taking new meds as directed.  HPI    Review of Systems     Objective:   Physical Exam        Assessment & Plan:  HTN- BP looks great. meds working well. Keep her regular followup appointment.

## 2011-02-25 NOTE — Telephone Encounter (Signed)
Error- jr

## 2011-03-06 ENCOUNTER — Ambulatory Visit (INDEPENDENT_AMBULATORY_CARE_PROVIDER_SITE_OTHER): Payer: Self-pay | Admitting: Family Medicine

## 2011-03-06 ENCOUNTER — Encounter: Payer: Self-pay | Admitting: Family Medicine

## 2011-03-06 DIAGNOSIS — R209 Unspecified disturbances of skin sensation: Secondary | ICD-10-CM

## 2011-03-06 DIAGNOSIS — E119 Type 2 diabetes mellitus without complications: Secondary | ICD-10-CM

## 2011-03-06 DIAGNOSIS — L821 Other seborrheic keratosis: Secondary | ICD-10-CM

## 2011-03-06 DIAGNOSIS — Z23 Encounter for immunization: Secondary | ICD-10-CM

## 2011-03-06 DIAGNOSIS — R200 Anesthesia of skin: Secondary | ICD-10-CM

## 2011-03-06 LAB — POCT GLYCOSYLATED HEMOGLOBIN (HGB A1C): Hemoglobin A1C: 6.8

## 2011-03-06 NOTE — Progress Notes (Signed)
  Subjective:    Patient ID: Jon [name], male    DOB: [birth year], 65 y.o.   MRN: [id number]  Diabetes He presents for his follow-up diabetic visit. He has type 2 diabetes mellitus. His disease course has been stable. (Says occ drops low if skips eating ) Associated symptoms include foot paresthesias. Pertinent negatives for diabetes include no chest pain, no foot ulcerations, no polydipsia, no polyphagia and no polyuria. Symptoms are stable. Current diabetic treatments: He has not been eating well.  He is compliant with treatment all of the time. His weight is stable. He has not had a previous visit with a dietician. (Hasn't checked his sugar in 2 weeks. ) An ACE inhibitor/angiotensin II receptor blocker is being taken. He does not see a podiatrist.  Mole on the upper back that burns occassionally.Has been there for years. Started burning bout 6 mo ago. He also has a lesion in his right facial cheek. It is Prindle and says he often makes it while shaving his beard.  Review of Systems  Cardiovascular: Negative for chest pain.  Genitourinary: Negative for polyuria.  Hematological: Negative for polydipsia and polyphagia.       Objective:   Physical Exam  Constitutional: He is oriented to person, place, and time. He appears well-developed and well-nourished.  HENT:  Head: Normocephalic and atraumatic.  Cardiovascular: Normal rate, regular rhythm, normal heart sounds and intact distal pulses.   Pulmonary/Chest: Effort normal and breath sounds normal.  Musculoskeletal:       Left great toe with normal range of motion strength is 5 over 5. Capillary refill is 3 seconds. There are no lesions or breaks in skin or rashes.  Neurological: He is alert and oriented to person, place, and time.  Skin: Skin is warm and dry.       He has a symmetrical benign-appearing flesh-colored mole on his upper back near his neck. It's approximately 0.3 cm in size. There is no evidence of irritation around the lesion.  He also has what appears to be in a regular Fragoso slightly raised seborrheic keratosis on the right facial cheek. No irritation or bleeding.  Psychiatric: He has a normal mood and affect. His behavior is normal.          Assessment & Plan:  DM - A1C was 6.8. He is at goal. I still encouraged him to work on his diet which he admits is not well controlled right now. We will continue his current regimen of medication. I did encourage him to at least check his sugar is not feeling well.   He plans to reschedule his colonscopy.   Left great toe numbness-I refer him to podiatry for further evaluation. This could be early neuropathy possibly related to diabetes, but I like to have him further evaluated to make sure that there is not something more mature skeletal or anatomical that is causing some nerve pain or pressure in the great toe.  Seborrheic keratosis-cryotherapy was performed. Patient tolerated well. The lesion recurs and consider further biopsy. I also gave them reassurance about the flesh-colored papular lesion on his upper back. I suspect that since his naris color it likely get irritated which causes the stinging type sensation. If this persists or gets worse or changes in size then I would recommend biopsy for further evaluation.

## 2011-03-06 NOTE — Patient Instructions (Signed)
Keep up the good work, but continue to really work on [employer].

## 2011-03-21 ENCOUNTER — Emergency Department
Admission: EM | Admit: 2011-03-21 | Discharge: 2011-03-21 | Disposition: A | Discharge status: Home / Self Care | Payer: Self-pay | Source: Home / Self Care | Attending: Family Medicine | Admitting: Family Medicine

## 2011-03-21 ENCOUNTER — Encounter: Payer: Self-pay | Admitting: *Deleted

## 2011-03-21 DIAGNOSIS — H9209 Otalgia, unspecified ear: Secondary | ICD-10-CM

## 2011-03-21 DIAGNOSIS — J029 Acute pharyngitis, unspecified: Secondary | ICD-10-CM

## 2011-03-21 DIAGNOSIS — H9202 Otalgia, left ear: Secondary | ICD-10-CM

## 2011-03-21 HISTORY — DX: Essential (primary) hypertension: I10

## 2011-03-21 LAB — POCT CBC W AUTO DIFF (K'VILLE URGENT CARE)

## 2011-03-21 LAB — POCT RAPID STREP A (OFFICE): Rapid Strep A Screen: NEGATIVE

## 2011-03-21 MED ORDER — AMOXICILLIN 875 MG PO TABS: 875.0000 mg | ORAL_TABLET | Freq: Two times a day (BID) | ORAL | Status: AC

## 2011-03-21 NOTE — ED Notes (Signed)
Patient c/o left ear pain since Monday. The pain has moved around to the left side of his neck/throat.

## 2011-03-21 NOTE — ED Provider Notes (Signed)
History     CSN: [id number] Arrival date & time: 03/21/2011  2:18 PM   First MD Initiated Contact with Patient 03/21/11 1418      Chief Complaint  Patient presents with  . Otalgia      HPI Comments: Patient notes that he developed soreness in his left nose, without swelling, that migrated to his left cheek and ear.  No nasal congestion or cough  Patient is a 65 y.o. male presenting with ear pain and pharyngitis. The history is provided by the patient.  Otalgia This is a new problem. The current episode started more than 2 days ago. There is pain in the left ear. The problem occurs constantly. The problem has been gradually worsening. There has been no fever. The pain is at a severity of 8/10. Associated symptoms include sore throat and neck pain. Pertinent negatives include no ear discharge, no hearing loss, no rhinorrhea, no cough and no rash.  Sore Throat This is a new problem. The current episode started 3 to 5 hours ago. The problem occurs constantly. The problem has been gradually worsening. The symptoms are aggravated by swallowing. The symptoms are relieved by nothing.    Past Medical History  Diagnosis Date  . Stroke 7/11    put on plavix but can't afford it  . Epididymitis   . Diabetes mellitus   . Hypertension     Past Surgical History  Procedure Date  . Pilonidal cyst excision 1960, 61, 62, 63  . Vasectomy     Family History  Problem Relation Age of Onset  . Diabetes Other     family hx of    History  Substance Use Topics  . Smoking status: Current Everyday Smoker    Types: Cigarettes  . Smokeless tobacco: Not on file  . Alcohol Use: No      Review of Systems  Constitutional: Negative.   HENT: Positive for ear pain, sore throat and neck pain. Negative for hearing loss, congestion, rhinorrhea, mouth sores, trouble swallowing, sinus pressure and ear discharge.   Eyes: Negative.   Respiratory: Negative.  Negative for cough.   Cardiovascular: Negative.    Gastrointestinal: Negative.   Genitourinary: Negative.   Musculoskeletal: Negative for arthralgias.  Skin: Negative for rash.  Neurological: Negative for dizziness.    Allergies  Review of patient's allergies indicates no known allergies.  Home Medications   Current Outpatient Rx  Name Route Sig Dispense Refill  . CLOPIDOGREL BISULFATE 75 MG PO TABS Oral Take 1 tablet (75 mg total) by mouth daily. 30 tablet 2  . GLIPIZIDE 10 MG PO TABS Oral Take 1 tablet (10 mg total) by mouth daily. 30 tablet 2  . GLUCOSE BLOOD VI STRP  Use as directed bid to check blood sugar 100 each 3  . LISINOPRIL 10 MG PO TABS Oral Take 1 tablet (10 mg total) by mouth daily. 30 tablet 3  . METFORMIN HCL 1000 MG PO TABS  TAKE ONE TABLET BY MOUTH TWICE DAILY 60 tablet 3  . NYSTATIN 100000 UNIT/GM EX OINT Topical Apply topically 2 (two) times daily. 30 g 0  . SIMVASTATIN 40 MG PO TABS Oral Take 40 mg by mouth daily.        BP 129/73  Pulse 84  Temp(Src) 98.6 F (37 C) (Oral)  Resp 16  Ht 6\' 2"  (1.88 m)  Wt 227 lb (102.967 kg)  BMI 29.15 kg/m2  SpO2 98%  Physical Exam  Constitutional: He appears well-developed and well-nourished. No  distress.  HENT:  Head: Normocephalic and atraumatic.  Right Ear: Tympanic membrane and external ear normal. No drainage or tenderness. No middle ear effusion.  Left Ear: Tympanic membrane normal. There is tenderness.  Mouth/Throat: Oropharynx is clear and moist.       Right canal has cerumen present, not completely blocking the canal. Left auditory meatus is tender with insertion of speculum, but no discharge present.  Cerumen is present, but able to visualize tympanic membrane   Eyes: Conjunctivae are normal. Pupils are equal, round, and reactive to light. Right eye exhibits no discharge. Left eye exhibits no discharge.  Neck: Neck supple.       Tender anterior cervical nodes  Cardiovascular: Normal heart sounds.   Pulmonary/Chest: Breath sounds normal.    Lymphadenopathy:    He has cervical adenopathy.  Skin: Skin is warm and dry.    ED Course  Procedures     Labs Reviewed  POCT RAPID STREP A (OFFICE):  Negative  POCT CBC W AUTO DIFF ([facility]):  CBC:  WBC 9.5 LY 24.7; MO 4.4; GR 70.9; Hgb 15.8       1. Acute pharyngitis   2. Otalgia of left ear       MDM  Suspect early viral URI.  Left ear pain may also represent TMJ inflammation Throat culture pending. Treat symptomatically for now:  Increase fluid intake, begin expectorant, topical decongestant.  Add cough suppressant at bedtime if cough develops. May take Tylenol for pain.  If not improving 5 days begin amoxicillin (given Rx to hold).  Followup with PCP if not improving 7 to 10 days.         Donna Christen, MD 03/21/11 [phone]

## 2011-03-31 ENCOUNTER — Other Ambulatory Visit: Payer: Self-pay | Admitting: Family Medicine

## 2011-05-19 ENCOUNTER — Other Ambulatory Visit: Payer: Self-pay | Admitting: Family Medicine

## 2011-06-03 ENCOUNTER — Other Ambulatory Visit: Payer: Self-pay | Admitting: *Deleted

## 2011-06-03 ENCOUNTER — Other Ambulatory Visit: Payer: Self-pay | Admitting: Family Medicine

## 2011-06-03 MED ORDER — SIMVASTATIN 40 MG PO TABS: 40.0000 mg | ORAL_TABLET | Freq: Every day | ORAL | Status: DC

## 2011-06-04 ENCOUNTER — Other Ambulatory Visit: Payer: Self-pay | Admitting: Family Medicine

## 2011-06-05 ENCOUNTER — Ambulatory Visit (INDEPENDENT_AMBULATORY_CARE_PROVIDER_SITE_OTHER): Payer: Medicare Other | Admitting: Family Medicine

## 2011-06-05 ENCOUNTER — Encounter: Payer: Self-pay | Admitting: Family Medicine

## 2011-06-05 DIAGNOSIS — E1142 Type 2 diabetes mellitus with diabetic polyneuropathy: Secondary | ICD-10-CM | POA: Insufficient documentation

## 2011-06-05 DIAGNOSIS — Z1211 Encounter for screening for malignant neoplasm of colon: Secondary | ICD-10-CM

## 2011-06-05 DIAGNOSIS — E1149 Type 2 diabetes mellitus with other diabetic neurological complication: Secondary | ICD-10-CM

## 2011-06-05 DIAGNOSIS — I10 Essential (primary) hypertension: Secondary | ICD-10-CM

## 2011-06-05 DIAGNOSIS — E119 Type 2 diabetes mellitus without complications: Secondary | ICD-10-CM

## 2011-06-05 DIAGNOSIS — G609 Hereditary and idiopathic neuropathy, unspecified: Secondary | ICD-10-CM

## 2011-06-05 LAB — POCT GLYCOSYLATED HEMOGLOBIN (HGB A1C): Hemoglobin A1C: 7.7

## 2011-06-05 MED ORDER — LISINOPRIL 20 MG PO TABS: 20.0000 mg | ORAL_TABLET | Freq: Every day | ORAL | Status: DC

## 2011-06-05 MED ORDER — AMBULATORY NON FORMULARY MEDICATION: Status: DC

## 2011-06-05 NOTE — Patient Instructions (Addendum)
Diabetes and Exercise Regular exercise is important and can help:    Control blood glucose (sugar).     Decrease blood pressure.     Control blood lipids (cholesterol, triglycerides).     Improve overall health.  BENEFITS FROM EXERCISE  Improved fitness.     Improved flexibility.     Improved endurance.     Increased bone density.     Weight control.     Increased muscle strength.     Decreased body fat.     Improvement of the body's use of insulin, a hormone.     Increased insulin sensitivity.     Reduction of insulin needs.     Reduced stress and tension.     Helps you feel better.  People with diabetes who add exercise to their lifestyle gain additional benefits, including:  Weight loss.     Reduced appetite.     Improvement of the body's use of blood glucose.     Decreased risk factors for heart disease:     Lowering of cholesterol and triglycerides.     Raising the level of good cholesterol (high-density lipoproteins, HDL).     Lowering blood sugar.     Decreased blood pressure.  TYPE 1 DIABETES AND EXERCISE  Exercise will usually lower your blood glucose.     If blood glucose is greater than 240 mg/dl, check urine ketones. If ketones are present, do not exercise.     Location of the insulin injection sites may need to be adjusted with exercise. Avoid injecting insulin into areas of the body that will be exercised. For example, avoid injecting insulin into:     The arms when playing tennis.     The legs when jogging. For more information, discuss this with your caregiver.     Keep a record of:     Food intake.     Type and amount of exercise.     Expected peak times of insulin action.     Blood glucose levels.  Do this before, during, and after exercise. Review your records with your caregiver. This will help you to develop guidelines for adjusting food intake and insulin amounts.   TYPE 2 DIABETES AND EXERCISE  Regular physical  activity can help control blood glucose.     Exercise is important because it may:     Increase the body's sensitivity to insulin.     Improve blood glucose control.     Exercise reduces the risk of heart disease. It decreases serum cholesterol and triglycerides. It also lowers blood pressure.     Those who take insulin or oral hypoglycemic agents should watch for signs of hypoglycemia. These signs include dizziness, shaking, sweating, chills, and confusion.     Body water is lost during exercise. It must be replaced. This will help to avoid loss of body fluids (dehydration) or heat stroke.  Be sure to talk to your caregiver before starting an exercise program to make sure it is safe for you. Remember, any activity is better than none.   Document Released: 07/19/2003 Document Revised: 01/08/2011 Document Reviewed: 11/02/2008 [employer] Patient Information 2012 [employer].1.5 Gram Low Sodium Diet A 1.5 gram sodium diet restricts the amount of sodium in the diet to no more than 1.5 g or 1500 mg daily. The American Heart Association recommends Americans over the age of 33 to consume no more than 1500 mg of sodium each day to reduce the risk of developing high blood pressure.  Research also shows that limiting sodium may reduce heart attack and stroke risk. Many foods contain sodium for flavor and sometimes as a preservative. When the amount of sodium in a diet needs to be low, it is important to know what to look for when choosing foods and drinks. The following includes some information and guidelines to help make it easier for you to adapt to a low sodium diet. QUICK TIPS  Do not add salt to food.     Avoid convenience items and fast food.     Choose unsalted snack foods.     Buy lower sodium products, often labeled as "lower sodium" or "no salt added."     Check food labels to learn how much sodium is in 1 serving.     When eating at a restaurant, ask that your food be prepared with  less salt or none, if possible.  READING FOOD LABELS FOR SODIUM INFORMATION The nutrition facts label is a good place to find how much sodium is in foods. Look for products with no more than 400 mg of sodium per serving. Remember that 1.5 g = 1500 mg. The food label may also list foods as:  Sodium-free: Less than 5 mg in a serving.     Very low sodium: 35 mg or less in a serving.     Low-sodium: 140 mg or less in a serving.     Light in sodium: 50% less sodium in a serving. For example, if a food that usually has 300 mg of sodium is changed to become light in sodium, it will have 150 mg of sodium.     Reduced sodium: 25% less sodium in a serving. For example, if a food that usually has 400 mg of sodium is changed to reduced sodium, it will have 300 mg of sodium.  CHOOSING FOODS Grains  Avoid: Salted crackers and snack items. Some cereals, including instant hot cereals. Bread stuffing and biscuit mixes. Seasoned rice or pasta mixes.     Choose: Unsalted snack items. Low-sodium cereals, oats, puffed wheat and rice, shredded wheat. English muffins and bread. Pasta.  Meats  Avoid: Salted, canned, smoked, spiced, pickled meats, including fish and poultry. Bacon, ham, sausage, cold cuts, hot dogs, anchovies.     Choose: Low-sodium canned tuna and salmon. Fresh or frozen meat, poultry, and fish.  Dairy  Avoid: Processed cheese and spreads. Cottage cheese. Buttermilk and condensed milk. Regular cheese.     Choose: Milk. Low-sodium cottage cheese. Yogurt. Sour cream. Low-sodium cheese.  Fruits and Vegetables  Avoid: Regular canned vegetables. Regular canned tomato sauce and paste. Frozen vegetables in sauces. Olives. Rosita Fire. Relishes. Sauerkraut.     Choose: Low-sodium canned vegetables. Low-sodium tomato sauce and paste. Frozen or fresh vegetables. Fresh and frozen fruit.  Condiments  Avoid: Canned and packaged gravies. Worcestershire sauce. Tartar sauce. Barbecue sauce. Soy sauce.  Steak sauce. Ketchup. Onion, garlic, and table salt. Meat flavorings and tenderizers.     Choose: Fresh and dried herbs and spices. Low-sodium varieties of mustard and ketchup. Lemon juice. Tabasco sauce. Horseradish.  SAMPLE 1.5 GRAM SODIUM MEAL PLAN Breakfast / Sodium (mg)  1 cup low-fat milk / 143 mg     1 whole-wheat English muffin / 240 mg     1 tbs heart-healthy margarine / 153 mg     1 hard-boiled egg / 139 mg     1 small orange / 0 mg  Lunch / Sodium (mg)  1 cup raw carrots /  76 mg     2 tbs no salt added peanut butter / 5 mg     2 slices whole-wheat bread / 270 mg     1 tbs jelly / 6 mg      cup red grapes / 2 mg  Dinner / Sodium (mg)  1 cup whole-wheat pasta / 2 mg     1 cup low-sodium tomato sauce / 73 mg     3 oz lean ground beef / 57 mg     1 small side salad (1 cup raw spinach leaves,  cup cucumber,  cup yellow bell pepper) with 1 tsp olive oil and 1 tsp red wine vinegar / 25 mg  Snack / Sodium (mg)  1 container low-fat vanilla yogurt / 107 mg     3 graham cracker squares / 127 mg  Nutrient Analysis  Calories: 1745     Protein: 75 g     Carbohydrate: 237 g     Fat: 57 g     Sodium: 1425 mg  Document Released: 04/28/2005 Document Revised: 01/08/2011 Document Reviewed: 07/30/2009 [facility] Patient Information 2012 [place], [place].

## 2011-06-05 NOTE — Progress Notes (Signed)
  Subjective:    Patient ID: Jon [name], male    DOB: 10/30/1945, 66 y.o.   MRN: [id number]  Diabetes He presents for his follow-up diabetic visit. He has type 2 diabetes mellitus. His disease course has been worsening. There are no hypoglycemic associated symptoms. Associated symptoms include foot paresthesias. Pertinent negatives for diabetes include no chest pain, no foot ulcerations, no visual change and no weight loss. Symptoms are stable. His weight is stable. He has not had a previous visit with a dietician. He rarely participates in exercise. His home blood glucose trend is decreasing steadily. An ACE inhibitor/angiotensin II receptor blocker is being taken. He sees a podiatrist.  HTN- no CP or SOB.   He also asked for handicap placard. He is also applying for Social Security disability.  Review of Systems  Constitutional: Negative for weight loss.  Cardiovascular: Negative for chest pain.       Objective:   Physical Exam  Constitutional: He is oriented to person, place, and time. He appears well-developed and well-nourished.  HENT:  Head: Normocephalic and atraumatic.  Cardiovascular: Normal rate, regular rhythm and normal heart sounds.   Pulmonary/Chest: Effort normal and breath sounds normal.  Neurological: He is alert and oriented to person, place, and time.  Skin: Skin is warm and dry.  Psychiatric: He has a normal mood and affect. His behavior is normal.          Assessment & Plan:  DM- Encourage more exercise.  A1C is up. He admits has not been eating well over the [place]. Would like to work on diet and exercise and keep his meds the same. F/u in 3 months.    Peripharal Neuropathy - declinded handicap placard. He needs more exercise.   Tob abuse. Still smoking.   HTN - Not at goal.  Inc lisinopril to 20mg  daily.  F/U in 6 weeks.   Discussed the need for colon screening colonoscopy. We'll schedule him previously but he never went. We will try again.  Rx  f\or shingles vaccine for  him to take to the pharmacy.

## 2011-06-06 ENCOUNTER — Ambulatory Visit: Payer: Self-pay | Admitting: Family Medicine

## 2011-06-10 ENCOUNTER — Telehealth: Payer: Self-pay | Admitting: Family Medicine

## 2011-06-10 NOTE — Telephone Encounter (Signed)
Please call the [facility] in [place] to get. He had I nerve biopsy done on one of his legs. Patient was asking for a copy which I do remember seeing but unfortunately I cannot find into the system. This would've probably been over the summer or even last spring.

## 2011-06-12 NOTE — Telephone Encounter (Signed)
Spoke with medical records and they are faxing copy of nerve biopsy and mailing a copy to pt's home address.

## 2011-06-17 ENCOUNTER — Telehealth: Payer: Self-pay | Admitting: Physician Assistant

## 2011-06-17 ENCOUNTER — Ambulatory Visit (INDEPENDENT_AMBULATORY_CARE_PROVIDER_SITE_OTHER): Payer: Medicare Other | Admitting: Physician Assistant

## 2011-06-17 DIAGNOSIS — I10 Essential (primary) hypertension: Secondary | ICD-10-CM

## 2011-06-17 MED ORDER — LISINOPRIL 20 MG PO TABS: 40.0000 mg | ORAL_TABLET | Freq: Every day | ORAL | Status: DC

## 2011-06-17 NOTE — Progress Notes (Signed)
  Subjective:    Patient ID: Jon [name], male    DOB: 01/21/1946, 66 y.o.   MRN: [id number] BP check HPI    Review of Systems     Objective:   Physical Exam        Assessment & Plan:  HTN- Patient still not at goal. Increase Lisinopril to 40mg . F/U with nurse visit in 4 weeks to recheck blood pressure.

## 2011-06-17 NOTE — Telephone Encounter (Signed)
Call patient and let him know to increase his lisinopril to 40mg  daily. I sent new prescription to pharmacy. His blood pressure is still high and we need to recheck blood pressure in 4 weeks with nurse visit.

## 2011-06-18 NOTE — Telephone Encounter (Signed)
Pt informed

## 2011-06-20 ENCOUNTER — Telehealth: Payer: Self-pay | Admitting: *Deleted

## 2011-06-20 ENCOUNTER — Encounter: Payer: Self-pay | Admitting: Gastroenterology

## 2011-06-20 MED ORDER — LISINOPRIL 40 MG PO TABS: 40.0000 mg | ORAL_TABLET | Freq: Every day | ORAL | Status: DC

## 2011-06-20 NOTE — Telephone Encounter (Signed)
I will send over new rx for 51m daily.

## 2011-06-20 NOTE — Telephone Encounter (Signed)
Pt came in the office and states he was on 10mg  of lisinopril and was supposed to increase dose back up to 20 mg qd ( which is what he was previously on). Pt wants to know if the last rx rx'ed was supposed to be for just 20 mg qd or 20 mg bid?

## 2011-06-24 LAB — BASIC METABOLIC PANEL
Chloride: 106 mEq/L (ref 96–112)
Potassium: 5 mEq/L (ref 3.5–5.3)
Sodium: 140 mEq/L (ref 135–145)

## 2011-06-24 LAB — TRIGLYCERIDES: Triglycerides: 224 mg/dL — ABNORMAL HIGH (ref <150)

## 2011-06-30 ENCOUNTER — Telehealth: Payer: Self-pay | Admitting: *Deleted

## 2011-07-14 ENCOUNTER — Other Ambulatory Visit: Payer: Self-pay | Admitting: Family Medicine

## 2011-07-14 ENCOUNTER — Telehealth: Payer: Self-pay | Admitting: *Deleted

## 2011-07-14 NOTE — Telephone Encounter (Signed)
Referral placed for ENT

## 2011-07-14 NOTE — Telephone Encounter (Signed)
Pt would like to know if you can refer him to an ENT for Left ear pain.

## 2011-07-22 ENCOUNTER — Other Ambulatory Visit: Payer: Self-pay | Admitting: Family Medicine

## 2011-07-22 ENCOUNTER — Encounter: Payer: Self-pay | Admitting: Gastroenterology

## 2011-07-22 ENCOUNTER — Ambulatory Visit (INDEPENDENT_AMBULATORY_CARE_PROVIDER_SITE_OTHER): Payer: Medicare Other | Admitting: Gastroenterology

## 2011-07-22 VITALS — BP 140/80 | HR 68 | Ht 74.0 in | Wt 230.0 lb

## 2011-07-22 DIAGNOSIS — Z1211 Encounter for screening for malignant neoplasm of colon: Secondary | ICD-10-CM

## 2011-07-22 DIAGNOSIS — Z125 Encounter for screening for malignant neoplasm of prostate: Secondary | ICD-10-CM | POA: Insufficient documentation

## 2011-07-22 MED ORDER — PEG-KCL-NACL-NASULF-NA ASC-C 100 G PO SOLR: 1.0000 | Freq: Once | ORAL | Status: DC

## 2011-07-22 NOTE — Progress Notes (Signed)
History of Present Illness:  Jon [name] is a pleasant 66 year old white male referred at the request of Dr. Glade Lloyd for colorectal cancer screening. He has no GI complaints except for mild constipation. He has no history of melena or hematochezia. He takes Plavix because of a history of a CVA.    Past Medical History  Diagnosis Date  . Stroke 7/11  . Epididymitis   . Diabetes mellitus   . Hypertension   . Hyperlipemia    Past Surgical History  Procedure Date  . Pilonidal cyst excision 1960, 61, 62, 63  . Vasectomy    family history includes Diabetes in his other.  There is no history of Colon cancer. Current Outpatient Prescriptions  Medication Sig Dispense Refill  . AMBULATORY NON FORMULARY MEDICATION Medication Name: zostavax IM x 1  1 vial  0  . clopidogrel (PLAVIX) 75 MG tablet Take 1 tablet (75 mg total) by mouth daily.  30 tablet  2  . fish oil-omega-3 fatty acids 1000 MG capsule Take 4 g by mouth daily.      [name] glipiZIDE (GLUCOTROL) 10 MG tablet TAKE ONE TABLET BY MOUTH EVERY DAY  90 tablet  0  . Glucosamine-Chondroitin (GLUCOSAMINE CHONDR COMPLEX PO) Take 1 tablet by mouth daily.      [name] glucose blood (ACCU-CHEK AVIVA) test strip Use as directed bid to check blood sugar  100 each  3  . lisinopril (PRINIVIL,ZESTRIL) 40 MG tablet Take 1 tablet (40 mg total) by mouth daily.  30 tablet  3  . metFORMIN (GLUCOPHAGE) 1000 MG tablet TAKE ONE TABLET BY MOUTH TWICE DAILY  60 tablet  1  . simvastatin (ZOCOR) 40 MG tablet Take 1 tablet (40 mg total) by mouth at bedtime.  90 tablet  0   Allergies as of 07/22/2011  . (No Known Allergies)    reports that he has been smoking Cigarettes.  He has never used smokeless tobacco. He reports that he does not drink alcohol or use illicit drugs.     Review of Systems: He has mild weakness in his right arm and leg related to his stroke Pertinent positive and negative review of systems were noted in the above HPI section. All other review of systems  were otherwise negative.  Vital signs were reviewed in today's medical record Physical Exam: General: Well developed , well nourished, no acute distress Head: Normocephalic and atraumatic Eyes:  sclerae anicteric, EOMI Ears: Normal auditory acuity Mouth: No deformity or lesions Neck: Supple, no masses or thyromegaly Lungs: Clear throughout to auscultation Heart: Regular rate and rhythm; no murmurs, rubs or bruits Abdomen: Soft, non tender and non distended. No masses, hepatosplenomegaly or hernias noted. Normal Bowel sounds Rectal:deferred Musculoskeletal: Symmetrical with no gross deformities  Skin: No lesions on visible extremities Pulses:  Normal pulses noted Extremities: No clubbing, cyanosis, edema or deformities noted Neurological: Alert oriented x 4, grossly nonfocal Cervical Nodes:  No significant cervical adenopathy Inguinal Nodes: No significant inguinal adenopathy Psychological:  Alert and cooperative. Normal mood and affect

## 2011-07-22 NOTE — Patient Instructions (Addendum)
You have been given a separate informational sheet regarding your tobacco use, the importance of quitting and local resources to help you quit. Colonoscopy A colonoscopy is an exam to evaluate your entire colon. In this exam, your colon is cleansed. A long fiberoptic tube is inserted through your rectum and into your colon. The fiberoptic scope (endoscope) is a long bundle of enclosed and very flexible fibers. These fibers transmit light to the area examined and send images from that area to your caregiver. Discomfort is usually minimal. You may be given a drug to help you sleep (sedative) during or prior to the procedure. This exam helps to detect lumps (tumors), polyps, inflammation, and areas of bleeding. Your caregiver may also take a small piece of tissue (biopsy) that will be examined under a microscope. LET YOUR CAREGIVER KNOW ABOUT:   Allergies to food or medicine.   Medicines taken, including vitamins, herbs, eyedrops, over-the-counter medicines, and creams.   Use of steroids (by mouth or creams).   Previous problems with anesthetics or numbing medicines.   History of bleeding problems or blood clots.   Previous surgery.   Other health problems, including diabetes and kidney problems.   Possibility of pregnancy, if this applies.  BEFORE THE PROCEDURE   A clear liquid diet may be required for 2 days before the exam.   Ask your caregiver about changing or stopping your regular medications.   Liquid injections (enemas) or laxatives may be required.   A large amount of electrolyte solution may be given to you to drink over a short period of time. This solution is used to clean out your colon.   You should be present 60 minutes prior to your procedure or as directed by your caregiver.  AFTER THE PROCEDURE   If you received a sedative or pain relieving medication, you will need to arrange for someone to drive you home.   Occasionally, there is a little blood passed with the  first bowel movement. Do not be concerned.  FINDING OUT THE RESULTS OF YOUR TEST Not all test results are available during your visit. If your test results are not back during the visit, make an appointment with your caregiver to find out the results. Do not assume everything is normal if you have not heard from your caregiver or the medical facility. It is important for you to follow up on all of your test results. HOME CARE INSTRUCTIONS   It is not unusual to pass moderate amounts of gas and experience mild abdominal cramping following the procedure. This is due to air being used to inflate your colon during the exam. Walking or a warm pack on your belly (abdomen) may help.   You may resume all normal meals and activities after sedatives and medicines have worn off.   Only take over-the-counter or prescription medicines for pain, discomfort, or fever as directed by your caregiver. Do not use aspirin or blood thinners if a biopsy was taken. Consult your caregiver for medicine usage if biopsies were taken.  SEEK IMMEDIATE MEDICAL CARE IF:   You have a fever.   You pass large blood clots or fill a toilet with blood following the procedure. This may also occur 10 to 14 days following the procedure. This is more likely if a biopsy was taken.   You develop abdominal pain that keeps getting worse and cannot be relieved with medicine.  Document Released: 04/25/2000 Document Revised: 04/17/2011 Document Reviewed: 12/09/2007 [facility] Patient Information 2012 [place], [place].

## 2011-07-22 NOTE — Assessment & Plan Note (Addendum)
Patient will be scheduled for colonoscopy. Plavix will be held in advance of the procedure (Plavix has previously been held by his dentist)

## 2011-07-28 ENCOUNTER — Other Ambulatory Visit: Payer: Self-pay | Admitting: Family Medicine

## 2011-07-28 ENCOUNTER — Encounter: Payer: Medicare Other | Admitting: Gastroenterology

## 2011-07-30 ENCOUNTER — Ambulatory Visit (AMBULATORY_SURGERY_CENTER): Payer: Medicare Other | Admitting: Gastroenterology

## 2011-07-30 ENCOUNTER — Encounter: Payer: Self-pay | Admitting: Gastroenterology

## 2011-07-30 VITALS — BP 155/89 | HR 78 | Temp 97.8°F | Resp 17 | Ht 74.0 in | Wt 230.0 lb

## 2011-07-30 DIAGNOSIS — D126 Benign neoplasm of colon, unspecified: Secondary | ICD-10-CM

## 2011-07-30 DIAGNOSIS — Z1211 Encounter for screening for malignant neoplasm of colon: Secondary | ICD-10-CM

## 2011-07-30 DIAGNOSIS — K648 Other hemorrhoids: Secondary | ICD-10-CM

## 2011-07-30 MED ORDER — SODIUM CHLORIDE 0.9 % IV SOLN: 500.0000 mL | INTRAVENOUS | Status: DC

## 2011-07-30 NOTE — Patient Instructions (Addendum)
Resume plavix in 3 days   YOU HAD AN ENDOSCOPIC PROCEDURE TODAY AT THE [facility]: Refer to the procedure report that was given to you for any specific questions about what was found during the examination.  If the procedure report does not answer your questions, please call your gastroenterologist to clarify.  If you requested that your care partner not be given the details of your procedure findings, then the procedure report has been included in a sealed envelope for you to review at your convenience later.  YOU SHOULD EXPECT: Some feelings of bloating in the abdomen. Passage of more gas than usual.  Walking can help get rid of the air that was put into your GI tract during the procedure and reduce the bloating. If you had a lower endoscopy (such as a colonoscopy or flexible sigmoidoscopy) you may notice spotting of blood in your stool or on the toilet paper. If you underwent a bowel prep for your procedure, then you may not have a normal bowel movement for a few days.  DIET: Your first meal following the procedure should be a light meal and then it is ok to progress to your normal diet.  A half-sandwich or bowl of soup is an example of a good first meal.  Heavy or fried foods are harder to digest and may make you feel nauseous or bloated.  Likewise meals heavy in dairy and vegetables can cause extra gas to form and this can also increase the bloating.  Drink plenty of fluids but you should avoid alcoholic beverages for 24 hours.  ACTIVITY: Your care partner should take you home directly after the procedure.  You should plan to take it easy, moving slowly for the rest of the day.  You can resume normal activity the day after the procedure however you should NOT DRIVE or use heavy machinery for 24 hours (because of the sedation medicines used during the test).    SYMPTOMS TO REPORT IMMEDIATELY: A gastroenterologist can be reached at any hour.  During normal business hours, 8:30 AM to 5:00  PM Monday through Friday, call [phone].  After hours and on weekends, please call the GI answering service at [phone] who will take a message and have the physician on call contact you.   Following lower endoscopy (colonoscopy or flexible sigmoidoscopy):  Excessive amounts of blood in the stool  Significant tenderness or worsening of abdominal pains  Swelling of the abdomen that is new, acute  Fever of 100F or higher    FOLLOW UP: If any biopsies were taken you will be contacted by phone or by letter within the next 1-3 weeks.  Call your gastroenterologist if you have not heard about the biopsies in 3 weeks.  Our staff will call the home number listed on your records the next business day following your procedure to check on you and address any questions or concerns that you may have at that time regarding the information given to you following your procedure. This is a courtesy call and so if there is no answer at the home number and we have not heard from you through the emergency physician on call, we will assume that you have returned to your regular daily activities without incident.  SIGNATURES/CONFIDENTIALITY: You and/or your care partner have signed paperwork which will be entered into your electronic medical record.  These signatures attest to the fact that that the information above on your After Visit Summary has been reviewed and is  understood.  Full responsibility of the confidentiality of this discharge information lies with you and/or your care-partner.    INFORMATION ON POLYPS , HEMORRHOIDS,& HIGH FIBER DIET GIVEN TO YOU TODAY

## 2011-07-30 NOTE — Progress Notes (Signed)
Patient did not experience any of the following events: a burn prior to discharge; a fall within the facility; wrong site/side/patient/procedure/implant event; or a hospital transfer or hospital admission upon discharge from the facility. (G8907) Patient did not have preoperative order for IV antibiotic SSI prophylaxis. (G8918)  

## 2011-07-30 NOTE — Op Note (Signed)
[facility] [address] [place], [place]  [address]  COLONOSCOPY PROCEDURE REPORT  PATIENT:  Jon [name], Jon [name]  MR#:  [id number] BIRTHDATE:  Sep 01, 1945, 66 yrs. old  GENDER:  male ENDOSCOPIST:  Barbette Hair. Arlyce Dice, MD REF. BY:  Nani Gasser, M.D. PROCEDURE DATE:  07/30/2011 PROCEDURE:  Colonoscopy with snare polypectomy ASA CLASS:  Class II INDICATIONS:  Routine Risk Screening MEDICATIONS:   MAC sedation, administered by CRNA propofol 300mg IV  DESCRIPTION OF PROCEDURE:   After the risks benefits and alternatives of the procedure were thoroughly explained, informed consent was obtained.  Digital rectal exam was performed and revealed no abnormalities.   The [id number] endoscope was introduced through the anus and advanced to the cecum, which was identified by both the appendix and ileocecal valve, without limitations.  The quality of the prep was good, using MoviPrep. The instrument was then slowly withdrawn as the colon was fully examined. <<PROCEDUREIMAGES>>  FINDINGS:  A sessile polyp was found in the ascending colon. It was 3 mm in size. Polyp was snared without cautery. Retrieval was successful (see image4). snare polyp  A sessile polyp was found in the descending colon. It was 3 mm in size. Polyp was snared without cautery. Retrieval was successful (see image5). snare polyp  Internal Hemorrhoids were found (see image6).  This was otherwise a normal examination of the colon (see image2). Retroflexed views in the rectum revealed no abnormalities.    The time to cecum =  1) 8.75  minutes. The scope was then withdrawn in 1) 9.50  minutes from the cecum and the procedure completed. COMPLICATIONS:  None ENDOSCOPIC IMPRESSION: 1) 3 mm sessile polyp in the ascending colon 2) 3 mm sessile polyp in the descending colon 3) Internal hemorrhoids 4) Otherwise normal examination RECOMMENDATIONS: 1) If the polyp(s) removed today are proven to be  adenomatous (pre-cancerous) polyps, you will need a repeat colonoscopy in 5 years. Otherwise you should continue to follow colorectal cancer screening guidelines for "routine risk" patients with colonoscopy in 10 years. You will receive a letter within 1-2 weeks with the results of your biopsy as well as final recommendations. Please call my office if you have not received a letter after 3 weeks. 2) resume plavix in 3 days REPEAT EXAM: You will receive a letter from Dr. Arlyce Dice in 1-2 weeks, after reviewing the final pathology, with followup recommendations.  ______________________________ Barbette Hair Arlyce Dice, MD  CC:  n. eSIGNED:   Barbette Hair. Bhavin Monjaraz at 07/30/2011 11:05 AM  Tressie Ellis, [phone]

## 2011-07-31 ENCOUNTER — Telehealth: Payer: Self-pay | Admitting: *Deleted

## 2011-07-31 NOTE — Telephone Encounter (Signed)
  Follow up Call-  Call back number 07/30/2011  Post procedure Call Back phone  # [phone]  Permission to leave phone message Yes     Patient questions:  Do you have a fever, pain , or abdominal swelling? no Pain Score  0 *  Have you tolerated food without any problems? yes  Have you been able to return to your normal activities? yes  Do you have any questions about your discharge instructions: Diet   no Medications  no Follow up visit  no  Do you have questions or concerns about your Care? no  Actions: * If pain score is 4 or above: No action needed, pain <4.

## 2011-08-04 ENCOUNTER — Encounter: Payer: Self-pay | Admitting: Gastroenterology

## 2011-09-02 ENCOUNTER — Encounter: Payer: Self-pay | Admitting: Family Medicine

## 2011-09-02 ENCOUNTER — Ambulatory Visit (INDEPENDENT_AMBULATORY_CARE_PROVIDER_SITE_OTHER): Payer: Medicare Other | Admitting: Family Medicine

## 2011-09-02 DIAGNOSIS — E119 Type 2 diabetes mellitus without complications: Secondary | ICD-10-CM

## 2011-09-02 DIAGNOSIS — Z1331 Encounter for screening for depression: Secondary | ICD-10-CM

## 2011-09-02 DIAGNOSIS — I10 Essential (primary) hypertension: Secondary | ICD-10-CM

## 2011-09-02 DIAGNOSIS — Z9181 History of falling: Secondary | ICD-10-CM

## 2011-09-02 LAB — POCT GLYCOSYLATED HEMOGLOBIN (HGB A1C): Hemoglobin A1C: 7.2

## 2011-09-02 MED ORDER — AMBULATORY NON FORMULARY MEDICATION: Status: DC

## 2011-09-02 NOTE — Patient Instructions (Addendum)
Please schedule biopsy of lesion on upper back.  Home Safety and Preventing Falls Falls are a leading cause of injury and while they affect all age groups, falls have greater short-term and long-term impact on older age groups. However, falls should not be a part of life or aging. It is possible for individuals and their families to use preventive measures to significantly decrease the likelihood that anyone, especially an older adult, will fall. There are many simple measures which can make your home safer with respect to preventing falls. The following actions can help reduce falls among all members of your family and are especially important as you age, when your balance, lower limb strength, coordination, and eyesight may be declining. The use of preventive measures will help to reduce you and your family's risk of falls and serious medical consequences. OUTDOORS  Repair cracks and edges of walkways and driveways.   Remove high doorway thresholds and trim shrubbery on the main path into your home.   Ensure there is good outside lighting at main entrances and along main walkways.   Clear walkways of tools, rocks, debris, and clutter.   Check that handrails are not broken and are securely fastened. Both sides of steps should have handrails.   In the garage, be attentive to and clean up grease or oil spills on the cement. This can make the surface extremely slippery.   In winter, have leaves, snow, and ice cleared regularly.   Use sand or salt on walkways during winter months.  BATHROOM  Install grab bars by the toilet and in the tub and shower.   Use non-skid mats or decals in the tub or shower.   If unable to easily stand unsupported while showering, place a plastic non slip stool in the shower to sit on when needed.   Install night lights.   Keep floors dry and clean up all water on the floor immediately.   Remove soap buildup in tub or shower on a regular basis.   Secure bath  mats with non-slip, double-sided rug tape.   Remove tripping hazards from the floors.  BEDROOMS  Install night lights.   Do not use oversized bedding.   Make sure a bedside light is easy to reach.   Keep a telephone by your bedside.   Make sure that you can get in and out of your bed easily.   Have a firm chair, with side arms, to use for getting dressed.   Remove clutter from around closets.   Store clothing, bed coverings, and other household items where you can reach them comfortably.   Remove tripping hazards from the floor.  LIVING AREAS AND STAIRWAYS  Turn on lights to avoid having to walk through dark areas.   Keep lighting uniform in each room. Place brighter lightbulbs in darker areas, including stairways.   Replace lightbulbs that burn out in stairways immediately.   Arrange furniture to provide for clear pathways.   Keep furniture in the same place.   Eliminate or tape down electrical cables in high traffic areas.   Place handrails on both sides of stairways. Use handrails when going up or down stairs.   Most falls occur on the top or bottom 3 steps.   Fix any loose handrails. Make sure handrails on both sides of the stairways are as long as the stairs.   Remove all walkway obstacles.   Coil or tape electrical cords off to the side of walking areas and out of the  way. If using many extension cords, have an electrician put in a new wall outlet to reduce or eliminate them.   Make sure spills are cleaned up quickly and allow time for drying before walking on freshly cleaned floors.   Firmly attach carpet with non-skid or two-sided tape.   Keep frequently used items within easy reach.   Remove tripping hazards such as throw rugs and clutter in walkways. Never leave objects on stairs.   Get rid of throw rugs elsewhere if possible.   Eliminate uneven floor surfaces.   Make sure couches and chairs are easy to get into and out of.   Check carpeting to  make sure it is firmly attached along stairs.   Make repairs to worn or loose carpet promptly.   Select a carpet pattern that does not visually hide the edge of steps.   Avoid placing throw rugs or scatter rugs at the top or bottom of stairways, or properly secure with carpet tape to prevent slippage.   Have an electrician put in a light switch at the top and bottom of the stairs.   Get light switches that glow.   Avoid the following practices: hurrying, inattention, obscured vision, carrying large loads, and wearing slip-on shoes.   Be aware of all pets.  KITCHEN  Place items that are used frequently, such as dishes and food, within easy reach.   Keep handles on pots and pans toward the center of the stove. Use back burners when possible.   Make sure spills are cleaned up quickly and allow time for drying.   Avoid walking on wet floors.   Avoid hot utensils and knives.   Position shelves so they are not too high or low.   Place commonly used objects within easy reach.   If necessary, use a sturdy step stool with a grab bar when reaching.   Make sure electrical cables are out of the way.   Do not use floor polish or wax that makes floors slippery.  OTHER HOME FALL PREVENTION STRATEGIES  Wear low heel or rubber sole shoes that are supportive and fit well.   Wear closed toe shoes.   Know and watch for side effects of medications. Have your caregiver or pharmacist look at all your medicines, even over-the-counter medicines. Some medicines can make you sleepy or dizzy.   Exercise regularly. Exercise makes you stronger and improves your balance and coordination.   Limit use of alcohol.   Use eyeglasses if necessary and keep them clean. Have your vision checked every year.   Organize your household in a manner that minimizes the need to walk distances when hurried, or go up and down stairs unnecessarily. For example, have a phone placed on at least each floor of your home.  If possible, have a phone beside each sitting or lying area where you spend the most time at home. Keep emergency numbers posted at all phones.   Use non-skid floor wax.   When using a ladder, make sure:   The base is firm.   All ladder feet are on level ground.   The ladder is angled against the wall properly.   When climbing a ladder, face the ladder and hold the ladder rungs firmly.   If reaching, always keep your hips and body weight centered between the rails.   When using a stepladder, make sure it is fully opened and both spreaders are firmly locked.   Do not climb a closed stepladder.  Avoid climbing beyond the second step from the top of a stepladder and the 4th rung from the top of an extension ladder.   Learn and use mobility aids as needed.   Change positions slowly. Arise slowly from sitting and lying positions. Sit on the edge of your bed before getting to your feet.   If you have a history of falls, ask someone to add color or contrast paint or tape to grab bars and handrails in your home.   If you have a history of falls, ask someone to place contrasting color strips on first and last steps.   Install an electrical emergency response system if you need one, and know how to use it.   If you have a medical or other condition that causes you to have limited physical strength, it is important that you reach out to family and friends for occasional help.  FOR CHILDREN:  If young children are in the home, use safety gates. At the top of stairs use screw-mounted gates; use pressure-mounted gates for the bottom of the stairs and doorways between rooms.   Young children should be taught to descend stairs on their stomachs, feet first, and later using the handrail.   Keep drawers fully closed to prevent them from being climbed on or pulled out entirely.   Move chairs, cribs, beds and other furniture away from windows.   Consider installing window guards on windows  ground floor and up, unless they are emergency fire exits. Make sure they have easy release mechanisms.   Consider installing special locks that only allow the window to be opened to a certain height.   Never rely on window screens to prevent falls.   Never leave babies alone on changing tables, beds or sofas. Use a changing table that has a restraining strap.   When a child can pull to a standing position, the crib mattress should be adjusted to its lowest position. There should be at least 26 inches between the top rails of the crib drop side and the mattress. Toys, bumper pads, and other objects that can be used as steps to climb out should be removed from the crib.   On bunk beds never allow a child under age 30 to sleep on the top bunk. For older children, if the upper bunk is not against a wall, use guard rails on both sides. No matter how old a child is, keep the guard rails in place on the top bunk since children roll during sleep. Do not permit horseplay on bunks.   Grass and soil surfaces beneath backyard playground equipment should be replaced with hardwood chips, shredded wood mulch, sand, pea gravel, rubber, crushed stone, or another safer material at depths of at least 9 to 12 inches.   When riding bikes or using skates, skateboards, skis, or snowboards, require children to wear helmets. Look for those that have stickers stating that they meet or exceed safety standards.   Vertical posts or pickets in deck, balcony, and stairway railings should be no more than 3 1/2 inches apart if a young baby will have access to the area. The space between horizontal rails or bars, and between the floor and the first horizontal rail or bar, should be no more than 3 1/2 inches.  Document Released: 04/18/2002 Document Revised: 04/17/2011 Document Reviewed: 02/15/2009 [facility] Patient Information 2012 [place], [place].

## 2011-09-02 NOTE — Progress Notes (Addendum)
  Subjective:    Patient ID: Jon [name], male    DOB: [birth year], 66 y.o.   MRN: [id number]  Diabetes He presents for his follow-up diabetic visit. He has type 2 diabetes mellitus. His disease course has been stable. There are no hypoglycemic associated symptoms. Pertinent negatives for diabetes include no blurred vision, no foot paresthesias, no foot ulcerations, no polydipsia, no polyphagia and no polyuria. There are no hypoglycemic complications. Symptoms are stable. He is compliant with treatment all of the time. He is following a generally healthy diet. He rarely participates in exercise. His home blood glucose trend is decreasing steadily. An ACE inhibitor/angiotensin II receptor blocker is being taken.  No CPr or SOB.     Review of Systems  Eyes: Negative for blurred vision.  Genitourinary: Negative for polyuria.  Hematological: Negative for polydipsia and polyphagia.       Objective:   Physical Exam  Constitutional: He is oriented to person, place, and time. He appears well-developed and well-nourished.  HENT:  Head: Normocephalic and atraumatic.  Cardiovascular: Normal rate, regular rhythm and normal heart sounds.   Pulmonary/Chest: Effort normal and breath sounds normal.  Musculoskeletal: He exhibits no edema.  Neurological: He is alert and oriented to person, place, and time.  Skin: Skin is warm and dry.  Psychiatric: He has a normal mood and affect. His behavior is normal.          Assessment & Plan:  DM- Uncontrolled but much improved form last time. He is now taking a B12 complex. F/U in 3 months. Continue current regimen. He would like 90 day supply for his prescriptions when they are due for refills. Foot Exam performed today. Lab Results  Component Value Date   HGBA1C 7.2 09/02/2011    HTN - Well controlled.  At goal. Continue current regimen. Continue work on diet and exercise.  He is still having pain in 2 moles on his upper back. I encouraged him to  schedule a biopsy of these 2 lesions.  Shingles vaccine due. He says he lost his prescription I gave him in January so preprinted today and encouraged him to try to get here locally.  Depression screening-PHQ non-score is 0. Negative for depression.  Fall assessment wall-score of 6 which is moderate. I will mail him a handout on how to reduce fall risk in the home.

## 2011-09-04 ENCOUNTER — Other Ambulatory Visit: Payer: Self-pay | Admitting: Family Medicine

## 2011-09-08 ENCOUNTER — Ambulatory Visit (INDEPENDENT_AMBULATORY_CARE_PROVIDER_SITE_OTHER): Payer: Medicare Other | Admitting: Family Medicine

## 2011-09-08 ENCOUNTER — Encounter: Payer: Self-pay | Admitting: Family Medicine

## 2011-09-08 ENCOUNTER — Other Ambulatory Visit: Payer: Self-pay | Admitting: Family Medicine

## 2011-09-08 VITALS — BP 157/84 | HR 80 | Ht 74.0 in | Wt 229.0 lb

## 2011-09-08 DIAGNOSIS — D229 Melanocytic nevi, unspecified: Secondary | ICD-10-CM

## 2011-09-08 DIAGNOSIS — D235 Other benign neoplasm of skin of trunk: Secondary | ICD-10-CM

## 2011-09-08 DIAGNOSIS — D225 Melanocytic nevi of trunk: Secondary | ICD-10-CM

## 2011-09-08 NOTE — Progress Notes (Signed)
  Subjective:    Patient ID: Jon [name], male    DOB: December 20, 1945, 66 y.o.   MRN: [id number]  HPI Here for removal of 2 skin lesions on his upper back. He says that they've been there for at least a year probably longer he has had significant pain and tenderness over this 2 lesions for the last year. He says to the point where it really bothers him and he will occasionally take something for it. He denies any bleeding or discharge a 50 really cannot see them very well because they're on his back. No prior history of skin cancer.   Review of Systems     Objective:   Physical Exam He has one lesion on his left upper back that appears to be a skin colored probably benign nevus. He does protrusion to the point where it certainly could easily become irritated. The lesion on his right upper back appears to be more consistent with a seborrheic keratosis but need to rule out a squamous cell.      Assessment & Plan:  Atypical lesion with pain - Bx performed of both painful lesion. Will call with bx report in about a week. He can remove the Band-Aids in the morning take a shower and then apply Vaseline and repeat Band-Aids for the next couple days to promote healing.  Shave Biopsy Procedure Note  Pre-operative Diagnosis: Suspicious lesion x 2.    Post-operative Diagnosis: same  Locations:right and left upper back  Indications: painful  Anesthesia: Lidocaine 1% with epinephrine without added sodium bicarbonate  Procedure Details  History of allergy to iodine: no  Patient informed of the risks (including bleeding and infection) and benefits of the  procedure and Verbal informed consent obtained.  The lesion and surrounding area were given a sterile prep using betadyne and draped in the usual sterile fashion. A scalpel was used to shave an area of skin approximately 0.6 cm by 0.7cm off the upper left back. Then shaved a 0.7 x 0.7 hyperkeratotic lesion off the right upper back.  Hemostasis  achieved with alumuninum chloride. Antibiotic ointment and a sterile dressing applied.  The specimens were sent for pathologic examination. The patient tolerated the procedure well.  EBL: 0.5 ml  Findings: Will await path report.   Condition: Stable  Complications: none.  Plan: 1. Instructed to keep the wound dry and covered for 24-48h and clean thereafter. 2. Warning signs of infection were reviewed.   3. Recommended that the patient use tylenol as needed for pain.  4. Return  prn.

## 2011-10-17 ENCOUNTER — Other Ambulatory Visit: Payer: Self-pay | Admitting: Family Medicine

## 2011-11-08 ENCOUNTER — Other Ambulatory Visit: Payer: Self-pay | Admitting: Family Medicine

## 2011-11-26 ENCOUNTER — Other Ambulatory Visit: Payer: Self-pay | Admitting: Family Medicine

## 2011-12-01 ENCOUNTER — Encounter: Payer: Self-pay | Admitting: Family Medicine

## 2011-12-01 ENCOUNTER — Ambulatory Visit (INDEPENDENT_AMBULATORY_CARE_PROVIDER_SITE_OTHER): Payer: Medicare Other | Admitting: Family Medicine

## 2011-12-01 VITALS — BP 106/64 | HR 80 | Ht 74.0 in | Wt 223.0 lb

## 2011-12-01 DIAGNOSIS — E781 Pure hyperglyceridemia: Secondary | ICD-10-CM

## 2011-12-01 DIAGNOSIS — F17200 Nicotine dependence, unspecified, uncomplicated: Secondary | ICD-10-CM

## 2011-12-01 DIAGNOSIS — Z720 Tobacco use: Secondary | ICD-10-CM

## 2011-12-01 DIAGNOSIS — E119 Type 2 diabetes mellitus without complications: Secondary | ICD-10-CM

## 2011-12-01 DIAGNOSIS — I10 Essential (primary) hypertension: Secondary | ICD-10-CM

## 2011-12-01 DIAGNOSIS — N200 Calculus of kidney: Secondary | ICD-10-CM

## 2011-12-01 LAB — POCT URINALYSIS DIPSTICK
Glucose, UA: NEGATIVE
Nitrite, UA: NEGATIVE
Protein, UA: NEGATIVE
Spec Grav, UA: 1.025
Urobilinogen, UA: 0.2

## 2011-12-01 LAB — POCT GLYCOSYLATED HEMOGLOBIN (HGB A1C): Hemoglobin A1C: 7.4

## 2011-12-01 LAB — POCT UA - MICROALBUMIN: Microalbumin Ur, POC: 10 mg/dL

## 2011-12-01 MED ORDER — LISINOPRIL 20 MG PO TABS: 20.0000 mg | ORAL_TABLET | Freq: Every day | ORAL | Status: DC

## 2011-12-01 NOTE — Patient Instructions (Signed)
Cut the lisinopril in half.   Sent a new rx to the pharmacy for the lisinopril 20mg  daily.

## 2011-12-01 NOTE — Progress Notes (Signed)
  Subjective:    Patient ID: Jon [name], male    DOB: 02/23/1946, 66 y.o.   MRN: [id number]  HPI  HAs been to ED twice since I last saw him 3 months ago for kidney stones. Given Phenergan, flomax, oxycodone.  Not sure if passed it but he is feeling much better.  He is no longer drinking Mtn Dew.  He brought in his discharge sheet with him  DM- Has been having low sugars to 104-124 in the last month.  Says feel he really hasn't done anything different but they are much better. He recently joined silver sneakers. No major dietary changes.  HTN- Says feels sluggish and tired and says he feels his BP is too low.  No chest pain or short of breath. He said he taking his medication consistently.  Review of Systems     Objective:   Physical Exam  Constitutional: He is oriented to person, place, and time. He appears well-developed and well-nourished.  HENT:  Head: Normocephalic and atraumatic.  Cardiovascular: Normal rate, regular rhythm and normal heart sounds.   Pulmonary/Chest: Effort normal and breath sounds normal.  Neurological: He is alert and oriented to person, place, and time.  Skin: Skin is warm and dry.  Psychiatric: He has a normal mood and affect. His behavior is normal.          Assessment & Plan:  DM - A1C is up from last time.  He really needs to be started on long acting insulin at bedtime. He Says he doesn't understand since his sugars have actually beeen much better. He wants to bring in his machine to see if off or not.  Normally I would add long-acting insulin at bedtime since he is Re: taking metformin and glipizide and says that he does take them consistently. He has recently joined silver sneakers and says he will try going daily and try to continue to work on his diet. Thus we will hold off on starting insulin at this time and see if over the next 3 months he can make an impact on his diet and exercise. If his A1c is over 7 at his followup then we will start Lantus  or Levemir. Lab Results  Component Value Date   HGBA1C 7.4 12/01/2011    HTN - Will dec lisinopril to 20mg  daliy since he is feeling symptomatic with his blood pressure.  Recheck BP at f/u in 3 months.   Kidney stones - doing well. No longer painful.  UA done today, negative for blood. Call if he has another episode. If he does then recommend referral to urology for further evaluation.  Tobacco abuse-he is still smoking and not ready to quit.  Hypertriglyceridemia-he is up to 4 facial daily and wants to go ahead and recheck his triglycerides.

## 2011-12-03 ENCOUNTER — Ambulatory Visit (INDEPENDENT_AMBULATORY_CARE_PROVIDER_SITE_OTHER): Payer: Medicare Other

## 2011-12-03 ENCOUNTER — Other Ambulatory Visit: Payer: Self-pay | Admitting: Family Medicine

## 2011-12-03 DIAGNOSIS — J438 Other emphysema: Secondary | ICD-10-CM

## 2011-12-03 DIAGNOSIS — F17200 Nicotine dependence, unspecified, uncomplicated: Secondary | ICD-10-CM

## 2011-12-03 DIAGNOSIS — Z720 Tobacco use: Secondary | ICD-10-CM

## 2011-12-04 LAB — COMPLETE METABOLIC PANEL WITH GFR
ALT: 21 U/L (ref 0–53)
AST: 19 U/L (ref 0–37)
Albumin: 4.5 g/dL (ref 3.5–5.2)
Alkaline Phosphatase: 57 U/L (ref 39–117)
Calcium: 9.4 mg/dL (ref 8.4–10.5)
Chloride: 107 mEq/L (ref 96–112)
Potassium: 5.3 mEq/L (ref 3.5–5.3)
Sodium: 141 mEq/L (ref 135–145)
Total Protein: 6.8 g/dL (ref 6.0–8.3)

## 2011-12-04 LAB — LIPID PANEL
Cholesterol: 128 mg/dL (ref 0–200)
LDL Cholesterol: 51 mg/dL (ref 0–99)
Total CHOL/HDL Ratio: 3.3 Ratio
VLDL: 38 mg/dL (ref 0–40)

## 2011-12-10 ENCOUNTER — Other Ambulatory Visit: Payer: Self-pay | Admitting: Family Medicine

## 2011-12-10 ENCOUNTER — Ambulatory Visit (INDEPENDENT_AMBULATORY_CARE_PROVIDER_SITE_OTHER): Payer: Medicare Other | Admitting: Family Medicine

## 2011-12-10 ENCOUNTER — Other Ambulatory Visit: Payer: Self-pay | Admitting: *Deleted

## 2011-12-10 VITALS — BP 118/72 | HR 82

## 2011-12-10 DIAGNOSIS — I10 Essential (primary) hypertension: Secondary | ICD-10-CM

## 2011-12-10 DIAGNOSIS — E119 Type 2 diabetes mellitus without complications: Secondary | ICD-10-CM

## 2011-12-10 LAB — GLUCOSE, POCT (MANUAL RESULT ENTRY): POC Glucose: 172 mg/dl — AB (ref 70–99)

## 2011-12-10 MED ORDER — METFORMIN HCL 1000 MG PO TABS: 1000.0000 mg | ORAL_TABLET | Freq: Two times a day (BID) | ORAL | Status: DC

## 2011-12-10 NOTE — Progress Notes (Signed)
  Subjective:    Patient ID: Jon [name], male    DOB: 09-19-1945, 66 y.o.   MRN: [id number]  HPI  Pt is here to compare his glucometer to our machine. His machine read 192,162 and then 168 here in the office. Review of Systems     Objective:   Physical Exam        Assessment & Plan:  HTN - Well controlled today. Continue current regimen.  Keep f/U appt with MD.    DM- REcommend put new battery in machine. If still not accurate then will be happy to give new rx for new machine.    Nani Gasser, MD

## 2011-12-16 ENCOUNTER — Other Ambulatory Visit: Payer: Self-pay | Admitting: Family Medicine

## 2011-12-17 ENCOUNTER — Other Ambulatory Visit: Payer: Self-pay | Admitting: *Deleted

## 2011-12-17 MED ORDER — SIMVASTATIN 40 MG PO TABS: 40.0000 mg | ORAL_TABLET | Freq: Every day | ORAL | Status: DC

## 2011-12-19 ENCOUNTER — Ambulatory Visit: Payer: Medicare Other | Admitting: Family Medicine

## 2011-12-23 ENCOUNTER — Ambulatory Visit (INDEPENDENT_AMBULATORY_CARE_PROVIDER_SITE_OTHER): Payer: Medicare Other | Admitting: Family Medicine

## 2011-12-23 ENCOUNTER — Encounter: Payer: Self-pay | Admitting: Family Medicine

## 2011-12-23 VITALS — BP 124/75 | HR 98 | Ht 74.0 in | Wt 222.0 lb

## 2011-12-23 DIAGNOSIS — F17200 Nicotine dependence, unspecified, uncomplicated: Secondary | ICD-10-CM

## 2011-12-23 DIAGNOSIS — R9389 Abnormal findings on diagnostic imaging of other specified body structures: Secondary | ICD-10-CM

## 2011-12-23 DIAGNOSIS — R05 Cough: Secondary | ICD-10-CM

## 2011-12-23 DIAGNOSIS — R918 Other nonspecific abnormal finding of lung field: Secondary | ICD-10-CM

## 2011-12-23 DIAGNOSIS — Z008 Encounter for other general examination: Secondary | ICD-10-CM

## 2011-12-23 DIAGNOSIS — Z0189 Encounter for other specified special examinations: Secondary | ICD-10-CM

## 2011-12-23 MED ORDER — ALBUTEROL SULFATE (2.5 MG/3ML) 0.083% IN NEBU
2.5000 mg | INHALATION_SOLUTION | Freq: Once | RESPIRATORY_TRACT | Status: AC
  Administered 2011-12-23: 2.5 mg via RESPIRATORY_TRACT

## 2011-12-23 NOTE — Patient Instructions (Signed)
Insomnia Insomnia is frequent trouble falling and/or staying asleep. Insomnia can be a long term problem or a short term problem. Both are common. Insomnia can be a short term problem when the wakefulness is related to a certain stress or worry. Long term insomnia is often related to ongoing stress during waking hours and/or poor sleeping habits. Overtime, sleep deprivation itself can make the problem worse. Every little thing feels more severe because you are overtired and your ability to cope is decreased. CAUSES    Stress, anxiety, and depression.   Poor sleeping habits.   Distractions such as TV in the bedroom.   Naps close to bedtime.   Engaging in emotionally charged conversations before bed.   Technical reading before sleep.   Alcohol and other sedatives. They may make the problem worse. They can hurt normal sleep patterns and normal dream activity.   Stimulants such as caffeine for several hours prior to bedtime.   Pain syndromes and shortness of breath can cause insomnia.   Exercise late at night.   Changing time zones may cause sleeping problems (jet lag).  It is sometimes helpful to have someone observe your sleeping patterns. They should look for periods of not breathing during the night (sleep apnea). They should also look to see how long those periods last. If you live alone or observers are uncertain, you can also be observed at a sleep clinic where your sleep patterns will be professionally monitored. Sleep apnea requires a checkup and treatment. Give your caregivers your medical history. Give your caregivers observations your family has made about your sleep.   SYMPTOMS    Not feeling rested in the morning.   Anxiety and restlessness at bedtime.   Difficulty falling and staying asleep.  TREATMENT    Your caregiver may prescribe treatment for an underlying medical disorders. Your caregiver can give advice or help if you are using alcohol or other drugs for  self-medication. Treatment of underlying problems will usually eliminate insomnia problems.   Medications can be prescribed for short time use. They are generally not recommended for lengthy use.   Over-the-counter sleep medicines are not recommended for lengthy use. They can be habit forming.   You can promote easier sleeping by making lifestyle changes such as:   Using relaxation techniques that help with breathing and reduce muscle tension.   Exercising earlier in the day.   Changing your diet and the time of your last meal. No night time snacks.   Establish a regular time to go to bed.   Counseling can help with stressful problems and worry.   Soothing music and white noise may be helpful if there are background noises you cannot remove.   Stop tedious detailed work at least one hour before bedtime.  HOME CARE INSTRUCTIONS    Keep a diary. Inform your caregiver about your progress. This includes any medication side effects. See your caregiver regularly. Take note of:   Times when you are asleep.   Times when you are awake during the night.   The quality of your sleep.   How you feel the next day.  This information will help your caregiver care for you.  Get out of bed if you are still awake after 15 minutes. Read or do some quiet activity. Keep the lights down. Wait until you feel sleepy and go back to bed.   Keep regular sleeping and waking hours. Avoid naps.   Exercise regularly.   Avoid distractions at bedtime. Distractions  include watching television or engaging in any intense or detailed activity like attempting to balance the household checkbook.   Develop a bedtime ritual. Keep a familiar routine of bathing, brushing your teeth, climbing into bed at the same time each night, listening to soothing music. Routines increase the success of falling to sleep faster.   Use relaxation techniques. This can be using breathing and muscle tension release routines. It can  also include visualizing peaceful scenes. You can also help control troubling or intruding thoughts by keeping your mind occupied with boring or repetitive thoughts like the old concept of counting sheep. You can make it more creative like imagining planting one beautiful flower after another in your backyard garden.   During your day, work to eliminate stress. When this is not possible use some of the previous suggestions to help reduce the anxiety that accompanies stressful situations.  MAKE SURE YOU:    Understand these instructions.   Will watch your condition.   Will get help right away if you are not doing well or get worse.  Document Released: 04/25/2000 Document Revised: 04/17/2011 Document Reviewed: 05/26/2007 [facility] Patient Information 2012 [place], [place].You Can Quit Smoking If you are ready to quit smoking or are thinking about it, congratulations! You have chosen to help yourself be healthier and live longer! There are lots of different ways to quit smoking. Nicotine gum, nicotine patches, a nicotine inhaler, or nicotine nasal spray can help with physical craving. Hypnosis, support groups, and medicines help break the habit of smoking. TIPS TO GET OFF AND STAY OFF CIGARETTES  Learn to predict your moods. Do not let a bad situation be your excuse to have a cigarette. Some situations in your life might tempt you to have a cigarette.   Ask friends and co-workers not to smoke around you.   Make your home smoke-free.   Never have "just one" cigarette. It leads to wanting another and another. Remind yourself of your decision to quit.   On a card, make a list of your reasons for not smoking. Read it at least the same number of times a day as you have a cigarette. Tell yourself everyday, "I do not want to smoke. I choose not to smoke."   Ask someone at home or work to help you with your plan to quit smoking.   Have something planned after you eat or have a cup of coffee. Take a  walk or get other exercise to perk you up. This will help to keep you from overeating.   Try a relaxation exercise to calm you down and decrease your stress. Remember, you may be tense and nervous the first two weeks after you quit. This will pass.   Find new activities to keep your hands busy. Play with a pen, coin, or rubber band. Doodle or draw things on paper.   Brush your teeth right after eating. This will help cut down the craving for the taste of tobacco after meals. You can try mouthwash too.   Try gum, breath mints, or diet candy to keep something in your mouth.  IF YOU SMOKE AND WANT TO QUIT:  Do not stock up on cigarettes. Never buy a carton. Wait until one pack is finished before you buy another.   Never carry cigarettes with you at work or at home.   Keep cigarettes as far away from you as possible. Leave them with someone else.   Never carry matches or a lighter with you.  Ask yourself, "Do I need this cigarette or is this just a reflex?"   Bet with someone that you can quit. Put cigarette money in a piggy bank every morning. If you smoke, you give up the money. If you do not smoke, by the end of the week, you keep the money.   Keep trying. It takes 21 days to change a habit!   Talk to your doctor about using medicines to help you quit. These include nicotine replacement gum, lozenges, or skin patches.  Document Released: 02/22/2009 Document Revised: 04/17/2011 Document Reviewed: 02/22/2009 [facility] Patient Information 2012 [place], [place].

## 2011-12-23 NOTE — Progress Notes (Signed)
  Subjective:    Patient ID: Jon [name], male    DOB: 27-Oct-1945, 66 y.o.   MRN: [id number]  HPI Smoking 1-2 ppd for about 50 years.  Here for spirometry today. Restarted the Chantix.  CXR in [place] that showed some emphatous changes.  He is not ready to quit. Will occ have a coughing fit when he reclines at night. Otherwise doesn't bother him. Denies any SOB.    Insomnia - have difficulty falling asleep bc the mind racing.  Tried some OTC benadryl.  No caffiene after lunch.    Review of Systems     Objective:   Physical Exam  Constitutional: He is oriented to person, place, and time. He appears well-developed and well-nourished.  Neurological: He is oriented to person, place, and time.  Skin: Skin is warm and dry.  Psychiatric: He has a normal mood and affect. His behavior is normal.          Assessment & Plan:  Tob Abuse - Discussed options. I reviewed with him his chest x-ray results. Reviewed spirometry results. He actually has good fucntion considering his heavy smoking history. We discussed the need for smoking cessation. He is clearly not ready to quit but he did restart small Chantix. He admits he doesn't feel sick or motivated to quit at this time because he knows he has good lung function. Encouraged him to think about it and handout given. Repeat spirometry in one year.  Insominia - he will continue with the Benadryl as needed. He wants to hold off on any prescription-type medications. We discussed working on good sleep hygiene. Handout given. Avoid caffeine after lunch time.

## 2012-01-20 ENCOUNTER — Other Ambulatory Visit: Payer: Self-pay | Admitting: Family Medicine

## 2012-03-01 ENCOUNTER — Ambulatory Visit: Payer: Medicare Other | Admitting: Family Medicine

## 2012-03-05 ENCOUNTER — Encounter: Payer: Self-pay | Admitting: Family Medicine

## 2012-03-05 ENCOUNTER — Ambulatory Visit (INDEPENDENT_AMBULATORY_CARE_PROVIDER_SITE_OTHER): Payer: Medicare Other | Admitting: Family Medicine

## 2012-03-05 VITALS — BP 128/74 | HR 74 | Ht 73.0 in | Wt 229.0 lb

## 2012-03-05 DIAGNOSIS — I10 Essential (primary) hypertension: Secondary | ICD-10-CM

## 2012-03-05 DIAGNOSIS — F4321 Adjustment disorder with depressed mood: Secondary | ICD-10-CM

## 2012-03-05 DIAGNOSIS — E119 Type 2 diabetes mellitus without complications: Secondary | ICD-10-CM

## 2012-03-05 DIAGNOSIS — Z23 Encounter for immunization: Secondary | ICD-10-CM

## 2012-03-05 LAB — POCT GLYCOSYLATED HEMOGLOBIN (HGB A1C): Hemoglobin A1C: 6.5

## 2012-03-05 MED ORDER — GLIPIZIDE 5 MG PO TABS: 5.0000 mg | ORAL_TABLET | Freq: Two times a day (BID) | ORAL | Status: DC

## 2012-03-05 NOTE — Progress Notes (Signed)
  Subjective:    Patient ID: Jon [name], male    DOB: 11/29/1945, 66 y.o.   MRN: [id number]  HPI DM- His mother died 8 weeks ago.  Thus, he has been eating donuts.  Says has been moving his meds aorund some.  Says waking up a few times at night with hypoglycemic events.  Has been as low as 48.  Has only happened in the last 30-60 days. Has been on metformin for 10 years. Has been on glipizide for 4 years.  No changes to his regimen. He did try taking his evening medications before his meal and after his evening meal to see if it made a difference but was unable to see if there is actual pattern to this. He says he's had about 5 or 6 hypoglycemic events in the last 2 months.  HTN - Well controllled. No chest pain or short of breath. Taking medications regularly without any side effects or problems.   Review of Systems     Objective:   Physical Exam  Constitutional: He is oriented to person, place, and time. He appears well-developed and well-nourished.  HENT:  Head: Normocephalic and atraumatic.  Cardiovascular: Normal rate, regular rhythm and normal heart sounds.   Pulmonary/Chest: Effort normal and breath sounds normal.  Neurological: He is alert and oriented to person, place, and time.  Skin: Skin is warm and dry.  Psychiatric: He has a normal mood and affect. His behavior is normal.          Assessment & Plan:  DM- Discussed getting all the sugary foods out of his diet and will cut his glipizide in half. I think a couple things to be having. He still does make insulin so he could be having a delayed and abnormal response to the higher sugar intake recently. This may be triggering some actual hypoglycemic events when her sugars are secured down. Also he has been on glipizide for several years but it is suspicious for causing his hypoglycemic events. We will back off on his dose to 5 mg twice a day. Call me if any problems in the interim.F/u in 3 month.    HTN - Well controlled.   Taking her regimen. Followup in 3 months.  Acute grief reaction-he feels overall he is doing well and is moving through the grief of losing his mother well.

## 2012-03-08 ENCOUNTER — Other Ambulatory Visit: Payer: Self-pay | Admitting: Family Medicine

## 2012-03-11 ENCOUNTER — Other Ambulatory Visit: Payer: Self-pay | Admitting: Family Medicine

## 2012-05-11 ENCOUNTER — Other Ambulatory Visit: Payer: Self-pay | Admitting: Family Medicine

## 2012-06-03 ENCOUNTER — Encounter: Payer: Self-pay | Admitting: Family Medicine

## 2012-06-03 ENCOUNTER — Ambulatory Visit (INDEPENDENT_AMBULATORY_CARE_PROVIDER_SITE_OTHER): Payer: Medicare Other | Admitting: Family Medicine

## 2012-06-03 VITALS — BP 118/70 | HR 81 | Resp 18 | Wt 229.0 lb

## 2012-06-03 DIAGNOSIS — E785 Hyperlipidemia, unspecified: Secondary | ICD-10-CM

## 2012-06-03 DIAGNOSIS — I10 Essential (primary) hypertension: Secondary | ICD-10-CM

## 2012-06-03 DIAGNOSIS — E119 Type 2 diabetes mellitus without complications: Secondary | ICD-10-CM

## 2012-06-03 DIAGNOSIS — F17200 Nicotine dependence, unspecified, uncomplicated: Secondary | ICD-10-CM

## 2012-06-03 MED ORDER — GLIPIZIDE 10 MG PO TABS: ORAL_TABLET | ORAL | Status: DC

## 2012-06-03 NOTE — Progress Notes (Signed)
  Subjective:    Patient ID: Jon [name], male    DOB: 06-02-1945, 67 y.o.   MRN: [id number]  HPI DM- no longer having lows at night.  Says has been eating as well as he should. He says over the holidays he several foods that he knows he should not have. No cuts or sores that aren't healing well. Fasting sugars are running 140-160s.  His last A1c was at goal but he says he expects it to be over 7 today. We recently cut back on his glipizide because of the hypoglycemic events overnight. He is now taking 5 mg twice a day. He is also on metformin 1000 mg twice a day. He is not on insulin.  HTN-  Pt denies chest pain, SOB, dizziness, or heart palpitations.  Taking meds as directed w/o problems.  Denies medication side effects.   Tobacco abuse-he continues to smoke. He says he is not interested in quitting. We did spirometry on him earlier this year and amazingly his numbers were fairly normal. The he did have some signs of COPD on his chest x-ray done earlier this year.   Review of Systems     Objective:   Physical Exam  Constitutional: He is oriented to person, place, and time. He appears well-developed and well-nourished.  HENT:  Head: Normocephalic and atraumatic.  Cardiovascular: Normal rate, regular rhythm and normal heart sounds.   Pulmonary/Chest: Effort normal and breath sounds normal.  Neurological: He is alert and oriented to person, place, and time.  Skin: Skin is warm and dry.  Psychiatric: He has a normal mood and affect. His behavior is normal.          Assessment & Plan:  DM- Uncontrolled.  Get back onto diet and exercise and program.  Discussed options of going back up on his glipizide to 10 mg twice a day. He was very hesitant to do this because he was actually having hypoglycemic events in the middle of the night when he was on that dose. We also discussed starting a different agent such as Onglyza or Januvia. This would help bring the sugars down, and allow him to stay  on a low dose of glipizide. He says he wants to try increasing his glipizide to 10 mg in the morning and keep it at 5 mg at night and really concentrate on diet and exercise. Though he admits he really doesn't exercise regularly. F/U in 3 months.  Consider Kombiglyze if his A1c is not calming down or if his fasting sugars in the morning are not coming under 130 with the slight increased dose of his glipizide.[name]   HTN- Well controlled. Continue current regimen.  Tob abuse - he says he is not interested in quitting smoking.

## 2012-06-03 NOTE — Patient Instructions (Addendum)
Please try to get your eye exam.  Increase glipizide to 10mg  in the AM and 5mg  in the PM.

## 2012-06-09 ENCOUNTER — Other Ambulatory Visit: Payer: Self-pay | Admitting: Family Medicine

## 2012-06-15 LAB — COMPLETE METABOLIC PANEL WITH GFR
ALT: 21 U/L (ref 0–53)
AST: 16 U/L (ref 0–37)
Albumin: 4.6 g/dL (ref 3.5–5.2)
Calcium: 9.2 mg/dL (ref 8.4–10.5)
Chloride: 104 mEq/L (ref 96–112)
Potassium: 4.5 mEq/L (ref 3.5–5.3)

## 2012-06-15 LAB — LIPID PANEL
LDL Cholesterol: 55 mg/dL (ref 0–99)
VLDL: 41 mg/dL — ABNORMAL HIGH (ref 0–40)

## 2012-07-08 ENCOUNTER — Other Ambulatory Visit: Payer: Self-pay | Admitting: Family Medicine

## 2012-08-20 ENCOUNTER — Other Ambulatory Visit: Payer: Self-pay | Admitting: Family Medicine

## 2012-09-03 ENCOUNTER — Encounter: Payer: Self-pay | Admitting: Family Medicine

## 2012-09-03 ENCOUNTER — Ambulatory Visit (INDEPENDENT_AMBULATORY_CARE_PROVIDER_SITE_OTHER): Payer: Medicare Other | Admitting: Family Medicine

## 2012-09-03 VITALS — BP 118/67 | HR 76 | Ht 73.0 in | Wt 227.0 lb

## 2012-09-03 DIAGNOSIS — E119 Type 2 diabetes mellitus without complications: Secondary | ICD-10-CM

## 2012-09-03 DIAGNOSIS — M720 Palmar fascial fibromatosis [Dupuytren]: Secondary | ICD-10-CM

## 2012-09-03 DIAGNOSIS — I10 Essential (primary) hypertension: Secondary | ICD-10-CM

## 2012-09-03 LAB — POCT GLYCOSYLATED HEMOGLOBIN (HGB A1C): Hemoglobin A1C: 7.3

## 2012-09-03 NOTE — Progress Notes (Signed)
  Subjective:    Patient ID: Jon [name], male    DOB: April 19, 1946, 67 y.o.   MRN: [id number]  HPI DM-Home sugars running 150s. No wounds that aren't healing well. Taking meds regularly. Not active.   HTN- Pt denies chest pain, SOB, dizziness, or heart palpitations.  Taking meds as directed w/o problems.  Denies medication side effects.     Has lumps on the palm of the hands.  Says not tender or painful. No triggering of fginers.   Review of Systems     Objective:   Physical Exam  Constitutional: He is oriented to person, place, and time. He appears well-developed and well-nourished.  HENT:  Head: Normocephalic and atraumatic.  Neck: Neck supple. No thyromegaly present.  Cardiovascular: Normal rate, regular rhythm and normal heart sounds.   No carotid bruits.  Pulmonary/Chest: Effort normal and breath sounds normal.  Musculoskeletal:  He does have some nodules on the flexor tendons in his home especially on his left hand. They're nontender. Non-erythematous. He has no contractures of his fingers. No triggering with hand motion. Good hand grip.  Lymphadenopathy:    He has no cervical adenopathy.  Neurological: He is alert and oriented to person, place, and time.  Skin: Skin is warm and dry.  Psychiatric: He has a normal mood and affect. His behavior is normal.          Assessment & Plan:  DM- Improved but not at goal.  Work on [employer] and diet.  Discussed starting insulin but he declined. Says will start on exercise. Neuropathy is about the same.  On statin, ACE, and plavis.  F/U in 3 months.   HTN - Well controlled. Continue current regimen. Followup in 3 months.  Dupteryns contracture-right now he mostly has the nodules does does not have any contracture yet. He has no triggering of the finger pain or discomfort. Given a handout about her diagnoses and reviewed this with him. If he gets to the point where he is unable to lay his hand flat on the counter then encouraged him  to let us know and we can send him for further evaluation by a hand surgeon for possible treatment options.

## 2012-09-03 NOTE — Patient Instructions (Signed)
Dupuytren's Contracture Dupuytren's contracture affects the fingers and the palm of the hand. This condition usually develops slowly. It may take many years to develop. The pinky finger and the ring finger are most often affected. These fingers start to curve inward, like a claw. At some point, the fingers cannot go straight anymore. This can make it hard to do things like:  Put on gloves.  Shake hands.  Grab something off a shelf. The condition usually does not cause pain and is not dangerous. The condition gets its name from the doctor who came up with an operation to fix the problem. His name was Baron Guillaume Dupuytren. Contracture means pulling inward. CAUSES  Dupuytren's contracture does not start with the fingers. It starts in the palm of the hand, under the skin. The tissue under the skin is called fascia. The fascia covers the cords (tendons) that control how the fingers move. In Dupuytren's contracture the fascia tissue becomes thick and then pulls on the cords. That causes the fingers to curl. The condition can affect both hands and any fingers, but it usually strikes one hand worse than the other. The fingers farthest from the thumb are most often the ones that curl. The cause is not clear. Some experts believe it results from an autoimmune reaction. That means the body's immune system (which fights off disease) attacks itself by mistake. What experts do know is that certain conditions and behaviors (called risk factors) make the chance of having this condition more likely. They include:  Age. Most people who have the condition are older than 50.  Sex. It affects men more often than women.  Family history. The condition tends to run in families from countries in [place].  Certain behaviors. People who smoke and drink alcohol are more apt to develop the problem.  Some other medical conditions. Having diabetes makes Dupuytren's contracture more likely. So does  having a condition that involves a seizure (when the brain's function is interrupted). SYMPTOMS  Signs of this condition take time to develop. Sometimes this takes weeks or months. More often, it takes several years.   Early symptoms:  Skin on the palm of the hand becomes thick. This is usually the first sign.  The skin may look dimpled or puckered.  Lumps (nodules) show up on the palm. There may be one or more lumps. They are not painful.  Later symptoms:  Thick cords of tissue form in the palm of the hand.  The pinky and ring fingers start to curl up into the palm.  The fingers cannot be straightened into their normal position. DIAGNOSIS  A physical examination is the main way that a healthcare provider can tell if you have Dupuytren's contracture. Other tests usually are not needed. The caregiver will probably:  Look at your hands. Feel your hands. This is to check for thickening and nodules.  Measure finger motion. This tells how much your fingers have contracted (pulled in).  Do a tabletop test. You will be asked to try to put your hand flat on a table, palm down. TREATMENT  There is no cure for Dupuytren's contracture. But there are ways to treat the symptoms. Options include:  Watching and waiting. The condition develops slowly. Often it does not create problems for a long time. Sometimes the skin gets thick and nodules form, but the fingers never curl. So, in some cases it is best to just watch the condition carefully and wait to see what happens.    Shots (injections). Different substances may be injected, including:  Steroids. These drugs block swelling. These shots should make the condition less uncomfortable. Steroids may also slow down the condition. Shots are given into the nodules. The effect only lasts awhile. More shots may have to be given.  Enzymes. These are proteins. They weaken the thick tissue. After an injection, the caregiver usually stretches the  fingers.  Needling. A needle is pushed through the skin and into the thick tissue. This is done in several spots. The goal is to break up the thickened tissue. Or to weaken it.  Surgery. This may be suggested if you cannot grasp objects. Or, if you can no longer put your hand in your pocket.  A cut (incision) is made in the palm of the hand. The thick tissue is removed.  Sometimes the thick tissue is attached to the skin. Then, the skin must be removed, too. It is replaced with a piece of skin from another place on your body. That is called a skin graft.  Occupational or hand therapy is almost always needed after surgery. This involves special exercises to get back the use of your hand and fingers. After a skin graft, several months of therapy may be needed.  Sometimes the condition comes back, even after surgery.  Other methods. You can do some things on your own. They include:  Stretching the fingers backwards. Do this often.  Warming the hand and massaging it. Again, do this often.  Using tools with padded grips. This should make things easier.  Wearing heavy gloves while working. This protects the hands. PROGNOSIS  Dupuytren's contracture usually develops slowly. There is no cure. But, the symptoms can be treated. Sometimes they come back after treatment, but not always. It is important to remember that this is a functional problem and not a life-threatening condition. Document Released: 02/23/2009 Document Revised: 07/21/2011 Document Reviewed: 02/23/2009 [facility] Patient Information 2013 [place], [place].

## 2012-09-08 ENCOUNTER — Other Ambulatory Visit: Payer: Self-pay | Admitting: Family Medicine

## 2012-09-11 ENCOUNTER — Other Ambulatory Visit: Payer: Self-pay | Admitting: Family Medicine

## 2012-10-09 ENCOUNTER — Other Ambulatory Visit: Payer: Self-pay | Admitting: Family Medicine

## 2012-10-11 ENCOUNTER — Ambulatory Visit (INDEPENDENT_AMBULATORY_CARE_PROVIDER_SITE_OTHER): Payer: Medicare Other | Admitting: Family Medicine

## 2012-10-11 ENCOUNTER — Encounter: Payer: Self-pay | Admitting: Family Medicine

## 2012-10-11 VITALS — BP 116/68 | HR 82

## 2012-10-11 DIAGNOSIS — D229 Melanocytic nevi, unspecified: Secondary | ICD-10-CM

## 2012-10-11 DIAGNOSIS — D239 Other benign neoplasm of skin, unspecified: Secondary | ICD-10-CM

## 2012-10-11 NOTE — Progress Notes (Signed)
  Subjective:    Patient ID: Jon [name], male    DOB: 09-24-1945, 67 y.o.   MRN: [id number]  HPI Lesion on the chest - he noticed initially a couple months ago. He says it slowly became more dark in color and was about the size of a pencil eraser. He says sometime about a week ago he noticed that it was gone and there was a scab in its place. He does not remember injuring it or traumatizing it. He says it has been somewhat itchy since then. He has a friend who was recently diagnosed with melanoma around the same age and wanted him to come and get checked out.   Review of Systems     Objective:   Physical Exam  Small approximately 2 x 3 mm dry crusted blood on the upper chest, central area. I did try to remove some of the dry blood with a alcohol swab. Unable to appreciate a distinct lesion underneath at this point in time. No surrounding erythema streaking or drainage from the wound.      Assessment & Plan:  Atypical lesion on the chest-right now I do see a scab on exam and so it's unclear if this is a worrisome lesion or not. I offered to do a shave biopsy today versus giving it 2 weeks to let the scab heal and see if the lesion recurs. If it recurs and he may need a biopsy. Or this could be a simple seborrheic keratosis which could be easily treated with cryotherapy. Gave reassurance. Call if any problems concerns or if the wound seems to become infected.

## 2012-10-21 ENCOUNTER — Other Ambulatory Visit: Payer: Self-pay | Admitting: *Deleted

## 2012-10-21 MED ORDER — GLUCOSE BLOOD VI STRP: ORAL_STRIP | Status: DC

## 2012-11-09 ENCOUNTER — Other Ambulatory Visit: Payer: Self-pay | Admitting: Family Medicine

## 2012-11-11 ENCOUNTER — Other Ambulatory Visit: Payer: Self-pay | Admitting: Family Medicine

## 2012-11-18 ENCOUNTER — Other Ambulatory Visit: Payer: Self-pay

## 2012-12-03 ENCOUNTER — Encounter: Payer: Self-pay | Admitting: Family Medicine

## 2012-12-03 ENCOUNTER — Ambulatory Visit (INDEPENDENT_AMBULATORY_CARE_PROVIDER_SITE_OTHER): Payer: Medicare Other | Admitting: Family Medicine

## 2012-12-03 VITALS — BP 120/68 | HR 77 | Ht 73.0 in | Wt 219.0 lb

## 2012-12-03 DIAGNOSIS — Z23 Encounter for immunization: Secondary | ICD-10-CM

## 2012-12-03 DIAGNOSIS — E119 Type 2 diabetes mellitus without complications: Secondary | ICD-10-CM

## 2012-12-03 DIAGNOSIS — I10 Essential (primary) hypertension: Secondary | ICD-10-CM

## 2012-12-03 LAB — BASIC METABOLIC PANEL WITH GFR
Calcium: 9.3 mg/dL (ref 8.4–10.5)
GFR, Est African American: 62 mL/min
Glucose, Bld: 85 mg/dL (ref 70–99)
Potassium: 5.1 mEq/L (ref 3.5–5.3)
Sodium: 138 mEq/L (ref 135–145)

## 2012-12-03 LAB — POCT GLYCOSYLATED HEMOGLOBIN (HGB A1C): Hemoglobin A1C: 7.3

## 2012-12-03 NOTE — Progress Notes (Signed)
  Subjective:    Patient ID: Jon [name], male    DOB: 09-04-1945, 67 y.o.   MRN: [id number]  HPI DM- sugar have been well controlled. No hypoglycemic events. No wounds that are not healing well.  Hypertension- Pt denies chest pain, SOB, dizziness, or heart palpitations.  Taking meds as directed w/o problems.  Denies medication side effects.     Review of Systems     Objective:   Physical Exam  Constitutional: He is oriented to person, place, and time. He appears well-developed and well-nourished.  HENT:  Head: Normocephalic and atraumatic.  Cardiovascular: Normal rate, regular rhythm and normal heart sounds.   Pulmonary/Chest: Effort normal and breath sounds normal.  Neurological: He is alert and oriented to person, place, and time.  Skin: Skin is warm and dry.  Psychiatric: He has a normal mood and affect. His behavior is normal.          Assessment & Plan:  DM- fair control.  Urine micro performed today. Foot exam performed today. On an ACE inhibitor, statin. Also on Plavix.Says he knows he need to work on his diet. Discussed possibly increasing his glipizide today but he wants to try to work on his diet over the next 3 months. He still over 7 when I see him back then we will adjust his glipizide. Lab Results  Component Value Date   HGBA1C 7.3 12/03/2012   HTN - well controlled. Continue current regimen. Followup in 3 months. Due for BMP today to check kidney function and creatinine. Next  Pneumonia vaccine updated today.

## 2012-12-04 LAB — MICROALBUMIN / CREATININE URINE RATIO
Microalb Creat Ratio: 3.5 mg/g (ref 0.0–30.0)
Microalb, Ur: 0.87 mg/dL (ref 0.00–1.89)

## 2012-12-10 ENCOUNTER — Other Ambulatory Visit: Payer: Self-pay | Admitting: Family Medicine

## 2012-12-17 ENCOUNTER — Other Ambulatory Visit: Payer: Self-pay | Admitting: Family Medicine

## 2013-01-13 ENCOUNTER — Other Ambulatory Visit: Payer: Self-pay | Admitting: Family Medicine

## 2013-02-11 ENCOUNTER — Other Ambulatory Visit: Payer: Self-pay | Admitting: Family Medicine

## 2013-03-07 ENCOUNTER — Ambulatory Visit: Payer: Medicare Other | Admitting: Family Medicine

## 2013-03-09 ENCOUNTER — Ambulatory Visit (INDEPENDENT_AMBULATORY_CARE_PROVIDER_SITE_OTHER): Payer: Medicare Other | Admitting: Family Medicine

## 2013-03-09 ENCOUNTER — Encounter: Payer: Self-pay | Admitting: Family Medicine

## 2013-03-09 VITALS — BP 122/72 | HR 72 | Wt 221.0 lb

## 2013-03-09 DIAGNOSIS — I10 Essential (primary) hypertension: Secondary | ICD-10-CM

## 2013-03-09 DIAGNOSIS — Z720 Tobacco use: Secondary | ICD-10-CM

## 2013-03-09 DIAGNOSIS — E119 Type 2 diabetes mellitus without complications: Secondary | ICD-10-CM

## 2013-03-09 DIAGNOSIS — F17200 Nicotine dependence, unspecified, uncomplicated: Secondary | ICD-10-CM

## 2013-03-09 DIAGNOSIS — Z8679 Personal history of other diseases of the circulatory system: Secondary | ICD-10-CM

## 2013-03-09 DIAGNOSIS — Z23 Encounter for immunization: Secondary | ICD-10-CM

## 2013-03-09 NOTE — Progress Notes (Signed)
Subjective:    Patient ID: Jon [name], male    DOB: [birth year], 67 y.o.   MRN: [id number]  HPI DM- On glipizide and metformin.  On satin and plavix. Has diabetic neuropathy as well. Home numbers sugars under 150.  Average is 115. 1-2 times a month gets to go to bathroom and has an episode of diarrhea and both times sugars were low in the 60s.    HTN-  Pt denies chest pain, SOB, dizziness, or heart palpitations.  Taking meds as directed w/o problems.  Denies medication side effects.    Hx of stroke - no new sxs.  Taking his plavix daily.   tob abuse - still smoking and not willing to quit.   Review of Systems     BP 122/72  Pulse 72  Wt 221 lb (100.245 kg)  BMI 29.16 kg/m2    No Known Allergies  Past Medical History  Diagnosis Date  . Stroke 7/11  . Epididymitis   . Diabetes mellitus   . Hypertension   . Hyperlipemia     Past Surgical History  Procedure Laterality Date  . Pilonidal cyst excision  1960, 61, 62, 63  . Vasectomy      History   Social History  . Marital Status: Divorced    Spouse Name: N/A    Number of Children: 2  . Years of Education: N/A   Occupational History  . Retired    Social History Main Topics  . Smoking status: Current Every Day Smoker    Types: Cigarettes  . Smokeless tobacco: Never Used     Comment: Counseling sheet given in exam room 07-2011   . Alcohol Use: No  . Drug Use: No  . Sexual Activity: Not on file   Other Topics Concern  . Not on file   Social History Narrative   Daily caffeine     Family History  Problem Relation Age of Onset  . Diabetes Other     family hx of  . Colon cancer Neg Hx     Outpatient Encounter Prescriptions as of 03/09/2013  Medication Sig Dispense Refill  . AMBULATORY NON FORMULARY MEDICATION Medication Name: zostavax IM x 1  1 vial  0  . clopidogrel (PLAVIX) 75 MG tablet TAKE ONE TABLET BY MOUTH ONCE DAILY  30 tablet  3  . fish oil-omega-3 fatty acids 1000 MG capsule Take 4 g by mouth  daily.      [name] glipiZIDE (GLUCOTROL) 5 MG tablet TAKE ONE TABLET BY MOUTH TWICE DAILY BEFORE A MEAL.  60 tablet  3  . Glucosamine-Chondroitin (GLUCOSAMINE CHONDR COMPLEX PO) Take 1 tablet by mouth daily.      [name] glucose blood (ACCU-CHEK AVIVA PLUS) test strip USE AS DIRECTED TWICE DAILY TO CHECK BLOOD SUGAR  100 each  2  . lisinopril (PRINIVIL,ZESTRIL) 20 MG tablet TAKE ONE TABLET BY MOUTH EVERY DAY  90 tablet  0  . metFORMIN (GLUCOPHAGE) 1000 MG tablet TAKE ONE TABLET BY MOUTH TWICE DAILY WITH MEALS  180 tablet  3  . simvastatin (ZOCOR) 40 MG tablet TAKE ONE TABLET BY MOUTH ONCE DAILY AT BEDTIME  90 tablet  1   No facility-administered encounter medications on file as of 03/09/2013.       Objective:   Physical Exam  Constitutional: He is oriented to person, place, and time. He appears well-developed and well-nourished.  HENT:  Head: Normocephalic and atraumatic.  Cardiovascular: Normal rate, regular rhythm and normal heart sounds.  No carotid bruits  Pulmonary/Chest: Effort normal and breath sounds normal.  Neurological: He is alert and oriented to person, place, and time.  Skin: Skin is warm and dry.  Psychiatric: He has a normal mood and affect. His behavior is normal.          Assessment & Plan:  DM- Well controlled.  A1C is 6.5.  F/U in 3 months.  On ACE, statin.    HTN- well controlled. F/U in 3 months.  Lab Results  Component Value Date   CHOL 130 06/15/2012   HDL 34* 06/15/2012   LDLCALC 55 06/15/2012   TRIG 207* 06/15/2012   CHOLHDL 3.8 06/15/2012    Tob abuse - encouraged cessation.  He is not ready to quit.   Hx of CVA  - doing well. No recurrent.  On plavix.

## 2013-05-02 ENCOUNTER — Other Ambulatory Visit: Payer: Self-pay | Admitting: Family Medicine

## 2013-05-13 ENCOUNTER — Telehealth: Payer: Self-pay | Admitting: *Deleted

## 2013-05-13 ENCOUNTER — Other Ambulatory Visit: Payer: Self-pay | Admitting: Family Medicine

## 2013-05-13 ENCOUNTER — Other Ambulatory Visit: Payer: Self-pay | Admitting: *Deleted

## 2013-05-13 DIAGNOSIS — E1149 Type 2 diabetes mellitus with other diabetic neurological complication: Secondary | ICD-10-CM

## 2013-05-13 MED ORDER — CLOPIDOGREL BISULFATE 75 MG PO TABS: ORAL_TABLET | ORAL | Status: DC

## 2013-05-13 NOTE — Telephone Encounter (Signed)
Pt called and asked that a referral be placed for him to see a podiatrist for neuropathy . Referral placed.Jon [name] [place]

## 2013-05-17 ENCOUNTER — Other Ambulatory Visit: Payer: Self-pay | Admitting: Family Medicine

## 2013-05-27 ENCOUNTER — Encounter: Payer: Self-pay | Admitting: Family Medicine

## 2013-05-27 ENCOUNTER — Ambulatory Visit (INDEPENDENT_AMBULATORY_CARE_PROVIDER_SITE_OTHER): Payer: Medicare Other | Admitting: Family Medicine

## 2013-05-27 VITALS — BP 110/66 | HR 83 | Temp 98.4°F | Ht 73.0 in | Wt 225.0 lb

## 2013-05-27 DIAGNOSIS — J218 Acute bronchiolitis due to other specified organisms: Secondary | ICD-10-CM

## 2013-05-27 MED ORDER — PREDNISONE 20 MG PO TABS: 40.0000 mg | ORAL_TABLET | Freq: Every day | ORAL | Status: DC

## 2013-05-27 MED ORDER — AZITHROMYCIN 250 MG PO TABS: ORAL_TABLET | ORAL | Status: DC

## 2013-05-27 NOTE — Progress Notes (Signed)
   Subjective:    Patient ID: Jon [name], male    DOB: [birth year], 68 y.o.   MRN: [id number]  HPI Dry cough x4 days. Worse at night.no nasal congestion. No fever, chills, or sweats. Says usually happens every 2-3 years. One year it went into PNA really quickly.  No bodyaches.  Some wheezing.  If lays on the right side hears the wheeze, better if lays on his left. No sore throat or ear pain. No GI symptoms. Heavy smoker.   Review of Systems     Objective:   Physical Exam  Constitutional: He is oriented to person, place, and time. He appears well-developed and well-nourished.  HENT:  Head: Normocephalic and atraumatic.  Right Ear: External ear normal.  Left Ear: External ear normal.  Nose: Nose normal.  Mouth/Throat: Oropharynx is clear and moist.  TMs and canals are clear.   Eyes: Conjunctivae and EOM are normal. Pupils are equal, round, and reactive to light.  Neck: Neck supple. No thyromegaly present.  Cardiovascular: Normal rate and normal heart sounds.   Pulmonary/Chest: Effort normal. He has wheezes.  Diffuse wheezing and rhonchi.  Lymphadenopathy:    He has no cervical adenopathy.  Neurological: He is alert and oriented to person, place, and time.  Skin: Skin is warm and dry.  Psychiatric: He has a normal mood and affect. His behavior is normal.          Assessment & Plan:  Bronchiolitis-he really does not have any mucus production or any other upper respiratory symptoms. He says he gets this every 3-4 years. He is a smoker. Twice it has turned into pneumonia and required hospitalization. He is very fearful of this. He does have significant rhonchi and wheezing diffusely on exam today. I will give him an albuterol treatment. He does not have known COPD as he has had fairly normal spirometry in the past but he is a very long-term heavy smoker. We will treat with oral steroids and albuterol. I did go ahead and give him prescription for azithromycin but asked him not to fill  it unless he spikes a fever over the weekend or suddenly gets worse. He was agreeable to that care plan.. Followup in one to 1- 2 weeks if not improving. His pulse ox is 95% today, as he normally runs around 98-99%. He actually felt significant relief after the albuterol neb treatment.

## 2013-06-03 ENCOUNTER — Encounter: Payer: Self-pay | Admitting: Physician Assistant

## 2013-06-03 ENCOUNTER — Ambulatory Visit (INDEPENDENT_AMBULATORY_CARE_PROVIDER_SITE_OTHER): Payer: Medicare Other | Admitting: Physician Assistant

## 2013-06-03 VITALS — BP 116/66 | HR 80 | Temp 97.8°F

## 2013-06-03 DIAGNOSIS — T25029A Burn of unspecified degree of unspecified foot, initial encounter: Secondary | ICD-10-CM

## 2013-06-03 DIAGNOSIS — T300 Burn of unspecified body region, unspecified degree: Secondary | ICD-10-CM

## 2013-06-03 NOTE — Progress Notes (Signed)
   Subjective:    Patient ID: Jon [name], male    DOB: 06/20/1945, 68 y.o.   MRN: [id number]  HPI Patient is a 68 year old white male who presents to the clinic with a burn on his right foot. Patient was taking hot water out of the microwave about 5 days ago and spilled a small around on his right foot. It did blister up and has now started to scab over. He did not place anything on the burn but didn't keep it clean. He has not noticed any worsening redness or purulent drainage. He is concerned because he is a diabetic and there is some minimal pain.no fever, chills.     Review of Systems     Objective:   Physical Exam  Skin:  Small 55mm by 22mm scabbed over dry wound that appears to be healing nicely. There is a 2cm by 3 cm area of erythema. Does not per pt appear to be enlarging. Is not warm to touch. Small blisters are still present around well healed scab.           Assessment & Plan:  Burn, diabetic, neuropathy-reassured patient that wound appears to be healing nicely. Discussed with patient signs of infection. There was some surrounding erythema that I suspect is a secondary burn to 2 tiny blisters in the center. There is no purulent drainage and I do not think infected today. Patient encouraged to watch daily until completely healed. Any signs of worsening symptoms occurred please call office. Continue to keep wound clean.

## 2013-06-06 ENCOUNTER — Other Ambulatory Visit: Payer: Self-pay | Admitting: Family Medicine

## 2013-06-09 ENCOUNTER — Ambulatory Visit (INDEPENDENT_AMBULATORY_CARE_PROVIDER_SITE_OTHER): Payer: Medicare Other | Admitting: Family Medicine

## 2013-06-09 ENCOUNTER — Encounter: Payer: Self-pay | Admitting: Family Medicine

## 2013-06-09 VITALS — BP 124/66 | HR 78 | Wt 225.0 lb

## 2013-06-09 DIAGNOSIS — I10 Essential (primary) hypertension: Secondary | ICD-10-CM

## 2013-06-09 DIAGNOSIS — E1142 Type 2 diabetes mellitus with diabetic polyneuropathy: Secondary | ICD-10-CM

## 2013-06-09 DIAGNOSIS — E119 Type 2 diabetes mellitus without complications: Secondary | ICD-10-CM

## 2013-06-09 DIAGNOSIS — E1149 Type 2 diabetes mellitus with other diabetic neurological complication: Secondary | ICD-10-CM

## 2013-06-09 DIAGNOSIS — F17200 Nicotine dependence, unspecified, uncomplicated: Secondary | ICD-10-CM

## 2013-06-09 LAB — POCT GLYCOSYLATED HEMOGLOBIN (HGB A1C): Hemoglobin A1C: 7.5

## 2013-06-09 NOTE — Progress Notes (Signed)
   Subjective:    Patient ID: Jon [name], male    DOB: January 19, 1946, 68 y.o.   MRN: [id number]  HPI Diabetes - no hypoglycemic events. No wounds or sores that are not healing well. No increased thirst or urination. Checking glucose at home. Taking medications as prescribed without any side effects.  Hypertension- Pt denies chest pain, SOB, dizziness, or heart palpitations.  Taking meds as directed w/o problems.  Denies medication side effects.    Peripheral neuropathy - doing well on gabapentin. Only had to take one tab at night to get to a therapeutic doe. No SE of medication.    Review of Systems     Objective:   Physical Exam  Constitutional: He is oriented to person, place, and time. He appears well-developed and well-nourished.  HENT:  Head: Normocephalic and atraumatic.  Cardiovascular: Normal rate, regular rhythm and normal heart sounds.   Pulmonary/Chest: Effort normal and breath sounds normal.  Neurological: He is alert and oriented to person, place, and time.  Skin: Skin is warm and dry.  Psychiatric: He has a normal mood and affect. His behavior is normal.          Assessment & Plan:  Diabetes with peripheral neuropathy-uncontrolled. His A1c is up over the holidays. He says he plans on getting back on track. Thus we will hold off on adjusting his regimen today and give him 3 months to work on getting his A1c down under 7.0. Eye exam etc. is up-to-date. He is due for CMP and lipid panel. Will go when he is fasting. Followup in 3 months. Lab Results  Component Value Date   HGBA1C 7.5 06/09/2013    Hypertension-well-controlled. Continue current regimen.  Peripheral neuropathy - doing well with single dose of gabapentin at bedtime. Fortunately a small dose has been therapeutic. Continue current regimen. He says he has plenty of refills. This was initially prescribed by the podiatrist I believe.

## 2013-06-09 NOTE — Patient Instructions (Signed)
Smoking Hazards  Smoking cigarettes is extremely bad for your health. Tobacco smoke has over 200 known poisons in it. It contains the poisonous gases nitrogen oxide and carbon monoxide. There are over 60 chemicals in tobacco smoke that cause cancer. Some of the chemicals found in cigarette smoke include:   · Cyanide.    · Benzene.    · Formaldehyde.    · Methanol (wood alcohol).    · Acetylene (fuel used in welding torches).    · Ammonia.    Even smoking lightly shortens your life expectancy by several years. You can greatly reduce the risk of medical problems for you and your family by stopping now. Smoking is the most preventable cause of death and disease in our society. Within days of quitting smoking, your circulation improves, you decrease the risk of having a heart attack, and your lung capacity improves. There may be some increased phlegm in the first few days after quitting, and it may take months for your lungs to clear up completely. Quitting for 10 years reduces your risk of developing lung cancer to almost that of a nonsmoker.   WHAT ARE THE RISKS OF SMOKING?  Cigarette smokers have an increased risk of many serious medical problems, including:  · Lung cancer.    · Lung disease (such as pneumonia, bronchitis, and emphysema).    · Heart attack and chest pain due to the heart not getting enough oxygen (angina).    · Heart disease and peripheral blood vessel disease.    · Hypertension.    · Stroke.    · Oral cancer (cancer of the lip, mouth, or voice box).    · Bladder cancer.    · Pancreatic cancer.    · Cervical cancer.    · Pregnancy complications, including premature birth.    · Stillbirths and smaller newborn babies, birth defects, and genetic damage to sperm.    · Early menopause.    · Lower estrogen level for women.    · Infertility.    · Facial wrinkles.    · Blindness.    · Increased risk of broken bones (fractures).    · Senile dementia.    · Stomach ulcers and internal bleeding.    · Delayed  wound healing and increased risk of complications during surgery.  Because of secondhand smoke exposure, children of smokers have an increased risk of the following:   · Sudden infant death syndrome (SIDS).    · Respiratory infections.    · Lung cancer.    · Heart disease.    · Ear infections.    WHY IS SMOKING ADDICTIVE?  Nicotine is the chemical agent in tobacco that is capable of causing addiction or dependence. When you smoke and inhale, nicotine is absorbed rapidly into the bloodstream through your lungs. Both inhaled and noninhaled nicotine may be addictive.   WHAT ARE THE BENEFITS OF QUITTING?   There are many health benefits to quitting smoking. Some are:   · The likelihood of developing cancer and heart disease decreases. Health improvements are seen almost immediately.    · Blood pressure, pulse rate, and breathing patterns start returning to normal soon after quitting.    · People who quit may see an improvement in their overall quality of life.    HOW DO YOU QUIT SMOKING?  Smoking is an addiction with both physical and psychological effects, and longtime habits can be hard to change. Your health care provider can recommend:  · Programs and community resources, which may include group support, education, or therapy.  · Replacement products, such as patches, gum, and nasal sprays. Use these products only as directed. Do not replace cigarette smoking with 

## 2013-06-16 LAB — LIPID PANEL
Cholesterol: 140 mg/dL (ref 0–200)
HDL: 38 mg/dL — ABNORMAL LOW (ref >39)
LDL CALC: 62 mg/dL (ref 0–99)
Total CHOL/HDL Ratio: 3.7 Ratio
Triglycerides: 199 mg/dL — ABNORMAL HIGH (ref <150)
VLDL: 40 mg/dL (ref 0–40)

## 2013-06-16 LAB — COMPLETE METABOLIC PANEL WITH GFR
ALBUMIN: 4.3 g/dL (ref 3.5–5.2)
ALT: 15 U/L (ref 0–53)
AST: 14 U/L (ref 0–37)
Alkaline Phosphatase: 60 U/L (ref 39–117)
BUN: 13 mg/dL (ref 6–23)
CALCIUM: 9.8 mg/dL (ref 8.4–10.5)
CHLORIDE: 104 meq/L (ref 96–112)
CO2: 27 meq/L (ref 19–32)
Creat: 1.14 mg/dL (ref 0.50–1.35)
GFR, EST AFRICAN AMERICAN: 77 mL/min
GFR, EST NON AFRICAN AMERICAN: 66 mL/min
GLUCOSE: 160 mg/dL — AB (ref 70–99)
POTASSIUM: 5.2 meq/L (ref 3.5–5.3)
SODIUM: 139 meq/L (ref 135–145)
TOTAL PROTEIN: 6.8 g/dL (ref 6.0–8.3)
Total Bilirubin: 0.4 mg/dL (ref 0.2–1.2)

## 2013-07-08 ENCOUNTER — Other Ambulatory Visit: Payer: Self-pay | Admitting: Family Medicine

## 2013-07-16 ENCOUNTER — Other Ambulatory Visit: Payer: Self-pay | Admitting: Family Medicine

## 2013-08-16 ENCOUNTER — Other Ambulatory Visit: Payer: Self-pay | Admitting: Family Medicine

## 2013-09-06 ENCOUNTER — Ambulatory Visit (INDEPENDENT_AMBULATORY_CARE_PROVIDER_SITE_OTHER): Payer: Medicare Other | Admitting: Family Medicine

## 2013-09-06 ENCOUNTER — Encounter: Payer: Self-pay | Admitting: Family Medicine

## 2013-09-06 VITALS — BP 134/78 | HR 76 | Wt 227.0 lb

## 2013-09-06 DIAGNOSIS — E119 Type 2 diabetes mellitus without complications: Secondary | ICD-10-CM

## 2013-09-06 DIAGNOSIS — R197 Diarrhea, unspecified: Secondary | ICD-10-CM

## 2013-09-06 DIAGNOSIS — I10 Essential (primary) hypertension: Secondary | ICD-10-CM

## 2013-09-06 LAB — POCT GLYCOSYLATED HEMOGLOBIN (HGB A1C): HEMOGLOBIN A1C: 7.4

## 2013-09-06 NOTE — Progress Notes (Signed)
   Subjective:    Patient ID: Jon [name], male    DOB: [birth year], 68 y.o.   MRN: [id number]  HPI DM- 6 months ago was waking up with bad dreams and was havig diarrhea in the middle of the night.  Also take super B complex and DHA. Moved around his medications and his dreams are better adn now having diarrhea during the daytime.  Stools are urgence and forceful.  Has been on metformin for 10 years but this only started 6 months ago.  Last colonoscopy was normal in 2013.  He is checking sugar occasionally. No CP, SOB, or palpitations.   Hyperlipidemia - tolerating statin well.  No side effects.   Review of Systems     Objective:   Physical Exam  Constitutional: He is oriented to person, place, and time. He appears well-developed and well-nourished.  HENT:  Head: Normocephalic and atraumatic.  Cardiovascular: Normal rate, regular rhythm and normal heart sounds.   Pulmonary/Chest: Effort normal and breath sounds normal.  Neurological: He is alert and oriented to person, place, and time.  Skin: Skin is warm and dry.  Psychiatric: He has a normal mood and affect. His behavior is normal.          Assessment & Plan:  DM- Uncontrolled.  Will hold metformin for now and see if causing diarrhea.  Will tx with Invokana 100mg  once daily samples for one week in its place.  On ACE, statin and plavix.    Hyperlipidemia - well controlled. Continue statin. Make sure taking dose at bedtime.    Diarrhea - could be from the metformin.  Will hold for 2 weeks and give samples of invokana.

## 2013-09-06 NOTE — Patient Instructions (Signed)
Start Invokana 100mg  daily for 2 weeks. Stop your metformin during this time. Let me know if the diarrhea is better.

## 2013-09-12 ENCOUNTER — Other Ambulatory Visit: Payer: Self-pay | Admitting: Family Medicine

## 2013-09-19 ENCOUNTER — Telehealth: Payer: Self-pay | Admitting: *Deleted

## 2013-09-19 MED ORDER — CANAGLIFLOZIN 100 MG PO TABS: 100.0000 mg | ORAL_TABLET | Freq: Every day | ORAL | Status: DC

## 2013-09-19 NOTE — Telephone Encounter (Signed)
Rx sent to pharmacy   

## 2013-09-19 NOTE — Telephone Encounter (Signed)
Pt called and lvm asking that rx be sent to his pharmacy for [place] he is pleased with the results.Jon [name]

## 2013-09-20 NOTE — Telephone Encounter (Signed)
Pt informed.  Zakaria Sedor, LPN  

## 2013-09-26 ENCOUNTER — Encounter: Payer: Self-pay | Admitting: Family Medicine

## 2013-09-26 ENCOUNTER — Ambulatory Visit (INDEPENDENT_AMBULATORY_CARE_PROVIDER_SITE_OTHER): Payer: Medicare Other | Admitting: Family Medicine

## 2013-09-26 VITALS — BP 119/67 | HR 77 | Wt 221.0 lb

## 2013-09-26 DIAGNOSIS — L570 Actinic keratosis: Secondary | ICD-10-CM

## 2013-09-26 DIAGNOSIS — E1165 Type 2 diabetes mellitus with hyperglycemia: Principal | ICD-10-CM

## 2013-09-26 DIAGNOSIS — IMO0001 Reserved for inherently not codable concepts without codable children: Secondary | ICD-10-CM

## 2013-09-26 NOTE — Progress Notes (Signed)
   Subjective:    Patient ID: Jon [name], male    DOB: 17-Jan-1946, 68 y.o.   MRN: [id number]  HPI Bump on forehead x 3 week. Iniitally clear x 2 week, but now it is red x 1 week. Has been rubbing it some. Not painful, no drainage. Also has a new lesion.    DM- Says sugars are running about 20 points higher then when on the metformin. He also is now expensing constipation and he is no longer on the metformin. When he was taking 1000 mg twice a day he was actually having stool incontinence and frequent diarrhea. Now on him though, he is actually having urinary frequency almost every hour. He started about a week ago.   Review of Systems     Objective:   Physical Exam  Constitutional: He appears well-developed and well-nourished.  Skin: Skin is warm and dry.  He has a pinpoint scab on his forehead was about 1 mm surrounding erythema. On the top of his scalp he has a thickened scaly lesion consistent with an actinic keratosis.  Psychiatric: He has a normal mood and affect. His behavior is normal.          Assessment & Plan:  Actinic keratosis - dicussed precancerous lesion. Recommended cryotherapy for treatment. The lesion on his scalp is most consistent with an actinic keratosis. The lesion on his forehead is unclear. He has picked at it and now has a scab is difficult to say this was also an actinic keratosis or something else. I recommended cryotherapy for both lesions.  DM- gave reassurance edema, can definitely cause urinary frequency. Encouraged him to give it about 2 weeks to settle out. He still having frequency at that time then consider evaluating for urinary tract infection. I would also like him to restart his metformin, half a tab, which is equivalent to 500 mg twice a day. This may help with his stool little bit more and help bring down his blood sugars but without causing stool incontinence.

## 2013-10-08 ENCOUNTER — Other Ambulatory Visit: Payer: Self-pay | Admitting: Family Medicine

## 2013-10-13 ENCOUNTER — Other Ambulatory Visit: Payer: Self-pay | Admitting: Family Medicine

## 2013-10-13 ENCOUNTER — Encounter: Payer: Self-pay | Admitting: Family Medicine

## 2013-11-14 ENCOUNTER — Ambulatory Visit: Payer: Medicare Other | Admitting: Family Medicine

## 2013-11-15 ENCOUNTER — Other Ambulatory Visit: Payer: Self-pay

## 2013-11-15 ENCOUNTER — Other Ambulatory Visit: Payer: Self-pay | Admitting: Family Medicine

## 2013-11-16 ENCOUNTER — Other Ambulatory Visit: Payer: Self-pay | Admitting: Family Medicine

## 2013-12-08 ENCOUNTER — Ambulatory Visit: Payer: Medicare Other | Admitting: Family Medicine

## 2013-12-17 ENCOUNTER — Other Ambulatory Visit: Payer: Self-pay | Admitting: Family Medicine

## 2013-12-27 ENCOUNTER — Encounter: Payer: Self-pay | Admitting: Family Medicine

## 2013-12-27 ENCOUNTER — Ambulatory Visit (INDEPENDENT_AMBULATORY_CARE_PROVIDER_SITE_OTHER): Payer: Medicare Other | Admitting: Family Medicine

## 2013-12-27 VITALS — BP 95/59 | HR 70 | Wt 215.0 lb

## 2013-12-27 DIAGNOSIS — Z23 Encounter for immunization: Secondary | ICD-10-CM

## 2013-12-27 DIAGNOSIS — S0300XA Dislocation of jaw, unspecified side, initial encounter: Secondary | ICD-10-CM

## 2013-12-27 DIAGNOSIS — E119 Type 2 diabetes mellitus without complications: Secondary | ICD-10-CM

## 2013-12-27 DIAGNOSIS — R070 Pain in throat: Secondary | ICD-10-CM

## 2013-12-27 DIAGNOSIS — I10 Essential (primary) hypertension: Secondary | ICD-10-CM

## 2013-12-27 DIAGNOSIS — R634 Abnormal weight loss: Secondary | ICD-10-CM

## 2013-12-27 LAB — POCT UA - MICROALBUMIN
Creatinine, POC: 200 mg/dL
Microalbumin Ur, POC: 10 mg/L

## 2013-12-27 LAB — POCT GLYCOSYLATED HEMOGLOBIN (HGB A1C): Hemoglobin A1C: 7.1

## 2013-12-27 MED ORDER — LISINOPRIL 10 MG PO TABS: ORAL_TABLET | ORAL | Status: DC

## 2013-12-27 NOTE — Progress Notes (Signed)
   Subjective:    Patient ID: Jon [name], male    DOB: Jun 11, 1945, 68 y.o.   MRN: [id number]  Diabetes   Diabetes - no hypoglycemic events. No wounds or sores that are not healing well. No increased thirst or urination. Checking glucose at home. Taking medications as prescribed without any side effects. Dropped a skid on his left great toe. Home sugars runnin 140-160s.  On glipizide.    Hypertension- Pt denies chest pain, SOB, dizziness, or heart palpitations.  Taking meds as directed w/o problems.  Denies medication side effects.  Has been feeling lightheaded when he stands up.   Loss of appetitie ove the last month or so and has lost about 6 lbs.    C/O of left ear pain on and off. Stays is intrmittant sharp stabbing pain and breif.  No pain with chewing. No popping, cracking of the jaw.  He actually saw an ENT at one time and they said that his ear itself was normal it was coming from somewhere else. Recently he's actually been having left-sided lower neck pain. It is actually very similar sharp stabbing sensation. He has been a heavy smoker for a long time. He denies any dysphasia or recent changes in voice. No fevers chills or sweats or swelling of the neck.   Review of Systems     Objective:   Physical Exam  Constitutional: He is oriented to person, place, and time. He appears well-developed and well-nourished.  HENT:  Head: Normocephalic and atraumatic.  Right Ear: External ear normal.  Left Ear: External ear normal.  Tender over the left TMJ. TMs and canals are clear bilat.   Eyes: Conjunctivae are normal. Pupils are equal, round, and reactive to light.  Neck: Neck supple. No thyromegaly present.  Cardiovascular: Normal rate, regular rhythm and normal heart sounds.   Pulmonary/Chest: Effort normal and breath sounds normal.  Lymphadenopathy:    He has no cervical adenopathy.  Neurological: He is alert and oriented to person, place, and time.  Skin: Skin is warm and dry.   Psychiatric: He has a normal mood and affect. His behavior is normal.          Assessment & Plan:  Abnormal weight loss- likely from the Invokana  Left ear pain-suspect most likely from TMJ. His symptoms are not exactly classic as is not getting pain and discomfort with chewing or jaw motion but he is tender over the TMJ joint. Recommend anti-inflammatories, heating pad and gentle stretches and avoid over chewing.  Left-sided neck pain-I. would like to refer him to ENT for further evaluation. Really think he needs to be evaluated and have his throat examined vocal cords examined because of his heavy smoking history.  Flu shot given.

## 2013-12-27 NOTE — Assessment & Plan Note (Signed)
Decrease lisinopril ot 10 mg. F/U in 3 mo

## 2013-12-27 NOTE — Assessment & Plan Note (Signed)
Improving. A1C is down to 7.1.  He has lost weight.  Foot exam performed today.  Has a soe on great toe with abrasion.  On ACE and statin. F/U in 3 mo.

## 2013-12-28 LAB — BASIC METABOLIC PANEL WITH GFR
BUN: 17 mg/dL (ref 6–23)
CO2: 26 mEq/L (ref 19–32)
Calcium: 9.4 mg/dL (ref 8.4–10.5)
Chloride: 103 mEq/L (ref 96–112)
Creat: 1.41 mg/dL — ABNORMAL HIGH (ref 0.50–1.35)
GFR, EST AFRICAN AMERICAN: 59 mL/min — AB
GFR, Est Non African American: 51 mL/min — ABNORMAL LOW
GLUCOSE: 93 mg/dL (ref 70–99)
POTASSIUM: 5 meq/L (ref 3.5–5.3)
SODIUM: 138 meq/L (ref 135–145)

## 2013-12-28 LAB — TSH: TSH: 3.953 u[IU]/mL (ref 0.350–4.500)

## 2014-01-11 ENCOUNTER — Telehealth: Payer: Self-pay | Admitting: *Deleted

## 2014-01-11 ENCOUNTER — Ambulatory Visit: Payer: Medicare Other

## 2014-01-11 NOTE — Telephone Encounter (Signed)
Ok to decrease lisinopril to 5mg  daily

## 2014-01-11 NOTE — Telephone Encounter (Signed)
Pt's BP is 106/62 p 73

## 2014-01-12 ENCOUNTER — Other Ambulatory Visit: Payer: Self-pay | Admitting: Family Medicine

## 2014-01-17 ENCOUNTER — Other Ambulatory Visit: Payer: Self-pay | Admitting: Family Medicine

## 2014-01-17 NOTE — Telephone Encounter (Signed)
Pt informed.Jon [name]  

## 2014-01-20 ENCOUNTER — Other Ambulatory Visit: Payer: Self-pay | Admitting: Family Medicine

## 2014-01-27 ENCOUNTER — Encounter: Payer: Self-pay | Admitting: Family Medicine

## 2014-01-27 ENCOUNTER — Ambulatory Visit (INDEPENDENT_AMBULATORY_CARE_PROVIDER_SITE_OTHER): Payer: Medicare Other | Admitting: Family Medicine

## 2014-01-27 ENCOUNTER — Other Ambulatory Visit: Payer: Self-pay | Admitting: Family Medicine

## 2014-01-27 VITALS — BP 108/68 | HR 73 | Temp 98.2°F | Wt 211.0 lb

## 2014-01-27 DIAGNOSIS — I10 Essential (primary) hypertension: Secondary | ICD-10-CM

## 2014-01-27 DIAGNOSIS — J438 Other emphysema: Secondary | ICD-10-CM

## 2014-01-27 DIAGNOSIS — E119 Type 2 diabetes mellitus without complications: Secondary | ICD-10-CM

## 2014-01-27 MED ORDER — LISINOPRIL 5 MG PO TABS: 5.0000 mg | ORAL_TABLET | Freq: Every day | ORAL | Status: DC

## 2014-01-27 MED ORDER — DOXYCYCLINE HYCLATE 100 MG PO TABS: ORAL_TABLET | ORAL | Status: AC

## 2014-01-27 MED ORDER — METHYLPREDNISOLONE ACETATE 80 MG/ML IJ SUSP
80.0000 mg | Freq: Once | INTRAMUSCULAR | Status: AC
  Administered 2014-01-27: 80 mg via INTRAMUSCULAR

## 2014-01-27 NOTE — Progress Notes (Signed)
CC: Jon [name] is a 68 y.o. male is here for cough x 1 week   Subjective: HPI:  Patient claims productive cough that has been present for the past week with subjective comfortable wheezing that is present all hours of the day but worse in the evening. Accompanied by shortness of breath with walking, green sputum but no blood in the sputum. Accompanied by subjective fevers without chills or night sweats.  Symptoms have been present for the last week and worsening on an almost daily basis. No interventions as of yet.  Denies chest pain, orthopnea, peripheral edema, sinus pressure, nasal congestion, ear pain, nor subjective postnasal drip.  During our encounter he got side tracked and wanted to know more about the physiology regarding high blood pressure and why individuals need for blood pressure medication decreases as an individual loses weight. Over the past month he did his decreasing blood pressures even that he continue to take 10 mg of lisinopril a daily basis. He began to get lightheaded when his systolic blood pressure would be below 110 however this has resolved over the past week ever since dropping his lisinopril to only 5 mg daily. Since then blood pressures have remained in the normotensive range without lightheadedness, chest pain, nor motor or sensory disturbances.  He would also like to know more about the physiology regarding weight loss and how this helps better control blood sugar.   Review Of Systems Outlined In HPI  Past Medical History  Diagnosis Date  . Stroke 7/11  . Epididymitis   . Diabetes mellitus   . Hypertension   . Hyperlipemia     Past Surgical History  Procedure Laterality Date  . Pilonidal cyst excision  1960, 61, 62, 63  . Vasectomy     Family History  Problem Relation Age of Onset  . Diabetes Other     family hx of  . Colon cancer Neg Hx     History   Social History  . Marital Status: Divorced    Spouse Name: N/A    Number of Children: 2   . Years of Education: N/A   Occupational History  . Retired    Social History Main Topics  . Smoking status: Current Every Day Smoker    Types: Cigarettes  . Smokeless tobacco: Never Used     Comment: Counseling sheet given in exam room 07-2011   . Alcohol Use: No  . Drug Use: No  . Sexual Activity: Not on file   Other Topics Concern  . Not on file   Social History Narrative   Daily caffeine      Objective: BP 108/68  Pulse 73  Temp(Src) 98.2 F (36.8 C) (Oral)  Wt 211 lb (95.709 kg)  SpO2 95%  General: Alert and Oriented, No Acute Distress HEENT: Pupils equal, round, reactive to light. Conjunctivae clear.  External ears unremarkable, canals clear with intact TMs with appropriate landmarks.  Middle ear appears open without effusion. Pink inferior turbinates.  Moist mucous membranes, pharynx without inflammation nor lesions.  Neck supple without palpable lymphadenopathy nor abnormal masses. Lungs: Comfortable work of breathing with moderate rhonchi and mild wheezing in all lung fields without signs of consolidation. Frequent coughing. Cardiac: Regular rate and rhythm. Normal S1/S2.  No murmurs, rubs, nor gallops.   Extremities: No peripheral edema.  Strong peripheral pulses.  Mental Status: No depression, anxiety, nor agitation. Skin: Warm and dry.  Assessment & Plan: Jon [name] was seen today for cough x 1 week.  Diagnoses and associated orders for this visit:  Other emphysema - doxycycline (VIBRA-TABS) 100 MG tablet; One by mouth twice a day for ten days. - methylPREDNISolone acetate (DEPO-MEDROL) injection 80 mg; Inject 1 mL (80 mg total) into the muscle once.  HYPERTENSION, MILD  DIABETES MELLITUS    He has an x-ray from 2013 showing emphysematous changes with a history and exam today quite consistent with what sounds a COPD exacerbation. There is no interested inhalers however he is agreeable to doxycycline and a Depo-Medrol injection since our hope is to minimize  blood sugar spikes that would more associated with prednisone taper/burst.  Time was taken to answer all his questions regarding the physiology of hypertension and blood sugar with respect to weight loss and why he loses weight he may be able to reduce both antihypertensives and anti-hyperglycemics.  40 minutes spent face-to-face during visit today of which at least 50% was counseling or coordinating care regarding: 1. Other emphysema   2. HYPERTENSION, MILD   3. DIABETES MELLITUS       Return if symptoms worsen or fail to improve.

## 2014-02-02 ENCOUNTER — Other Ambulatory Visit: Payer: Self-pay | Admitting: *Deleted

## 2014-02-02 MED ORDER — LISINOPRIL 5 MG PO TABS: 5.0000 mg | ORAL_TABLET | Freq: Every day | ORAL | Status: DC

## 2014-02-06 ENCOUNTER — Telehealth: Payer: Self-pay | Admitting: Family Medicine

## 2014-02-06 ENCOUNTER — Ambulatory Visit: Payer: Medicare Other | Admitting: Family Medicine

## 2014-02-06 DIAGNOSIS — Z0289 Encounter for other administrative examinations: Secondary | ICD-10-CM

## 2014-02-06 NOTE — Telephone Encounter (Signed)
Patient has been deemed a "no-show" for today's scheduled appointment.  Based on chief complaint and my chart review:  -Follow-up advised.  Contacting patient and urged to re-schedule visit.  If necessary this was communicated to the patient via phone or mail on:

## 2014-02-07 NOTE — Telephone Encounter (Signed)
I vm for Jon [name] to call to reschedule his appt.

## 2014-02-14 ENCOUNTER — Telehealth: Payer: Self-pay | Admitting: Family Medicine

## 2014-02-14 NOTE — Telephone Encounter (Signed)
Re: No show appt f/u weight loss & MRI I called Jon [name] to schedule missed appt and the need for him to reschedule based on Dr's chart review. He informed me that his weight has stabilized and his CT scan was negative. No issues at this time but is willing to come in if you still  need him to.

## 2014-02-14 NOTE — Telephone Encounter (Signed)
Re: No show on 9/28        F/u wt loss and MRI  I called Jon [name] about his missed appt on 9/28. He said his wieght has stabilized and his CT scan wan

## 2014-02-15 NOTE — Telephone Encounter (Signed)
We can hold off until his regular appointment time.

## 2014-02-21 ENCOUNTER — Other Ambulatory Visit: Payer: Self-pay | Admitting: Family Medicine

## 2014-03-21 ENCOUNTER — Other Ambulatory Visit: Payer: Self-pay | Admitting: *Deleted

## 2014-03-21 MED ORDER — CLOPIDOGREL BISULFATE 75 MG PO TABS: ORAL_TABLET | ORAL | Status: DC

## 2014-03-31 ENCOUNTER — Encounter: Payer: Self-pay | Admitting: Family Medicine

## 2014-03-31 ENCOUNTER — Ambulatory Visit (INDEPENDENT_AMBULATORY_CARE_PROVIDER_SITE_OTHER): Payer: Medicare Other | Admitting: Family Medicine

## 2014-03-31 VITALS — BP 103/62 | HR 83 | Temp 97.8°F | Ht 73.0 in | Wt 212.0 lb

## 2014-03-31 DIAGNOSIS — R630 Anorexia: Secondary | ICD-10-CM

## 2014-03-31 DIAGNOSIS — E119 Type 2 diabetes mellitus without complications: Secondary | ICD-10-CM

## 2014-03-31 MED ORDER — CANAGLIFLOZIN-METFORMIN HCL 50-500 MG PO TABS: 1.0000 | ORAL_TABLET | Freq: Two times a day (BID) | ORAL | Status: DC

## 2014-03-31 MED ORDER — LISINOPRIL 5 MG PO TABS: 5.0000 mg | ORAL_TABLET | Freq: Every day | ORAL | Status: DC

## 2014-03-31 NOTE — Progress Notes (Signed)
   Subjective:    Patient ID: Jon [name], male    DOB: [birth year], 69 y.o.   MRN: [id number]  HPI Hypertension- Pt denies chest pain, SOB, dizziness, or heart palpitations.  Taking meds as directed w/o problems.  Denies medication side effects.  His blood pressure was low at the last office visit. We had sent a new prescription for lisinopril 5 mg but he unfortunately did not get the prescription. It was sent to a different more marked than what he is currently using so he is still taking 10 mg daily. Has lost 15 lbs in the the last 7 months. Has had a dec appetite.  He says the had CT of neck wth ENT and ruled out throat cancer.    Diabetes - no hypoglycemic events. No wounds or sores that are not healing well. No increased thirst or urination. Checking glucose at home. Sugars running unde 120 fasting.   Taking medications as prescribed without any side effects. Note, he is also taking half of his metformin. He says when he started the Invokana with it he was having an increase in diarrhea so he cut his metformin in half and is taking half a tab twice a day.  Review of Systems     Objective:   Physical Exam  Constitutional: He is oriented to person, place, and time. He appears well-developed and well-nourished.  HENT:  Head: Normocephalic and atraumatic.  Cardiovascular: Normal rate, regular rhythm and normal heart sounds.   Pulmonary/Chest: Effort normal and breath sounds normal.  Neurological: He is alert and oriented to person, place, and time.  Skin: Skin is warm and dry.  Psychiatric: He has a normal mood and affect. His behavior is normal.          Assessment & Plan:  HTN - BP still on lower side. Likely secondary to weight loss. We'll decrease lisinopril down to 5 mg. Discussed with him that he will still need to stay on a low-dose ACE inhibitor for kidney protection just because of his diabetes even though he may or may not need it for blood pressure especially after his  weight loss.    DM-will combine the metfromin and invokana.  F/U in 3 months. A1C is down to 7.1%.  He is really making great progress and it sounds like his home blood sugars look fantastic. He may still have some elevated postmeal sugars that is causing his A1c stay elevated so we may need to consider another agent such as an injectable SGL-2  Decreased appetite-unclear etiology at this point. He is not really having any stomach pain discomfort, dysphagia or problems eating or nausea after eating. He says he just doesn't want to eat a lot of food. If anything this has helped him lose weight which is great.

## 2014-04-04 ENCOUNTER — Other Ambulatory Visit: Payer: Self-pay | Admitting: *Deleted

## 2014-04-04 MED ORDER — GLIPIZIDE 5 MG PO TABS: 5.0000 mg | ORAL_TABLET | Freq: Two times a day (BID) | ORAL | Status: DC

## 2014-04-20 ENCOUNTER — Other Ambulatory Visit: Payer: Self-pay | Admitting: *Deleted

## 2014-04-20 MED ORDER — SIMVASTATIN 40 MG PO TABS: 40.0000 mg | ORAL_TABLET | Freq: Every day | ORAL | Status: DC

## 2014-04-25 ENCOUNTER — Other Ambulatory Visit: Payer: Self-pay | Admitting: Family Medicine

## 2014-04-27 ENCOUNTER — Telehealth: Payer: Self-pay | Admitting: *Deleted

## 2014-04-27 NOTE — Telephone Encounter (Signed)
lvm informing pt of recommendations. .Dong Nimmons [place]  

## 2014-04-27 NOTE — Telephone Encounter (Signed)
Pt called and stated that he spoke with the pharmacist about some sxs that he has been experiencing over the past 6 mos he has developed and night terrors. The pharmacist looked over his meds and informed him that it could be caused by the gabapentin that he's taking (given by podiatrist) could this cause his night terrors seeing and thinking that things are in his room and it happens in the 1st hour to hour and one-half of sleep. He has no other social issues that he feels that may be causing this please advise.Audelia Hives [place]

## 2014-04-27 NOTE — Telephone Encounter (Signed)
Ok I would recommend weaning the dose off and see if the night terrors are better or not.

## 2014-07-04 ENCOUNTER — Encounter: Payer: Self-pay | Admitting: Family Medicine

## 2014-07-04 ENCOUNTER — Ambulatory Visit (INDEPENDENT_AMBULATORY_CARE_PROVIDER_SITE_OTHER): Payer: Medicare Other | Admitting: Family Medicine

## 2014-07-04 VITALS — BP 122/75 | HR 76 | Wt 222.0 lb

## 2014-07-04 DIAGNOSIS — I10 Essential (primary) hypertension: Secondary | ICD-10-CM | POA: Diagnosis not present

## 2014-07-04 DIAGNOSIS — G629 Polyneuropathy, unspecified: Secondary | ICD-10-CM

## 2014-07-04 DIAGNOSIS — E1342 Other specified diabetes mellitus with diabetic polyneuropathy: Secondary | ICD-10-CM | POA: Diagnosis not present

## 2014-07-04 DIAGNOSIS — F515 Nightmare disorder: Secondary | ICD-10-CM

## 2014-07-04 DIAGNOSIS — E119 Type 2 diabetes mellitus without complications: Secondary | ICD-10-CM

## 2014-07-04 DIAGNOSIS — E1142 Type 2 diabetes mellitus with diabetic polyneuropathy: Secondary | ICD-10-CM

## 2014-07-04 LAB — POCT GLYCOSYLATED HEMOGLOBIN (HGB A1C)

## 2014-07-04 NOTE — Progress Notes (Signed)
   Subjective:    Patient ID: Jon [name], male    DOB: 1945/09/23, 69 y.o.   MRN: [id number]  HPI Diabetes - no hypoglycemic events. No wounds or sores that are not healing well. No increased thirst or urination. Checking glucose at home. Taking medications as prescribed without any side effects. Sugar was 146 this AM.  Average is around the mid 120.   Hypertension- Pt denies chest pain, SOB, dizziness, or heart palpitations.  Taking meds as directed w/o problems.  Denies medication side effects.    Getting off the gabapentin did refuce his nightmares by about 80%.  Has been off for over a month now.  Says went onlone and did some research and now thinks about PTSD. He is not interested in any therapy.     Review of Systems     Objective:   Physical Exam  Constitutional: He is oriented to person, place, and time. He appears well-developed and well-nourished.  HENT:  Head: Normocephalic and atraumatic.  Cardiovascular: Normal rate, regular rhythm and normal heart sounds.   Pulmonary/Chest: Effort normal and breath sounds normal.  Neurological: He is alert and oriented to person, place, and time.  Skin: Skin is warm and dry.  Psychiatric: He has a normal mood and affect. His behavior is normal.          Assessment & Plan:  DM- well controlled. A1C is down to 6.8.   Continue current regimen. Due for CMP and lipids.  F/U in 3 months.   HTN - well controlled. Continue current regimen.    Peripheral neuropathy - he is opting not to start a new medication at this time.   Nightmares - improved off of gabapentin.

## 2014-07-14 DIAGNOSIS — E119 Type 2 diabetes mellitus without complications: Secondary | ICD-10-CM | POA: Diagnosis not present

## 2014-07-15 LAB — COMPLETE METABOLIC PANEL WITH GFR
ALT: 20 U/L (ref 0–53)
AST: 15 U/L (ref 0–37)
Albumin: 4.2 g/dL (ref 3.5–5.2)
Alkaline Phosphatase: 74 U/L (ref 39–117)
BUN: 9 mg/dL (ref 6–23)
CALCIUM: 8.9 mg/dL (ref 8.4–10.5)
CO2: 26 mEq/L (ref 19–32)
CREATININE: 1.25 mg/dL (ref 0.50–1.35)
Chloride: 108 mEq/L (ref 96–112)
GFR, EST AFRICAN AMERICAN: 68 mL/min
GFR, EST NON AFRICAN AMERICAN: 59 mL/min — AB
GLUCOSE: 156 mg/dL — AB (ref 70–99)
Potassium: 5 mEq/L (ref 3.5–5.3)
SODIUM: 143 meq/L (ref 135–145)
Total Bilirubin: 0.4 mg/dL (ref 0.2–1.2)
Total Protein: 6.7 g/dL (ref 6.0–8.3)

## 2014-07-15 LAB — LIPID PANEL
Cholesterol: 146 mg/dL (ref 0–200)
HDL: 39 mg/dL — AB (ref >=40)
LDL Cholesterol: 70 mg/dL (ref 0–99)
Total CHOL/HDL Ratio: 3.7 Ratio
Triglycerides: 184 mg/dL — ABNORMAL HIGH (ref <150)
VLDL: 37 mg/dL (ref 0–40)

## 2014-07-24 ENCOUNTER — Other Ambulatory Visit: Payer: Self-pay | Admitting: Family Medicine

## 2014-08-05 ENCOUNTER — Other Ambulatory Visit: Payer: Self-pay | Admitting: Family Medicine

## 2014-09-04 ENCOUNTER — Other Ambulatory Visit: Payer: Self-pay | Admitting: Family Medicine

## 2014-09-26 ENCOUNTER — Ambulatory Visit (INDEPENDENT_AMBULATORY_CARE_PROVIDER_SITE_OTHER): Payer: Medicare Other | Admitting: Family Medicine

## 2014-09-26 ENCOUNTER — Encounter: Payer: Self-pay | Admitting: Family Medicine

## 2014-09-26 VITALS — BP 125/74 | HR 82 | Ht 73.0 in | Wt 219.0 lb

## 2014-09-26 DIAGNOSIS — Z720 Tobacco use: Secondary | ICD-10-CM

## 2014-09-26 DIAGNOSIS — Z23 Encounter for immunization: Secondary | ICD-10-CM

## 2014-09-26 DIAGNOSIS — M545 Low back pain, unspecified: Secondary | ICD-10-CM

## 2014-09-26 DIAGNOSIS — Z8679 Personal history of other diseases of the circulatory system: Secondary | ICD-10-CM

## 2014-09-26 DIAGNOSIS — E119 Type 2 diabetes mellitus without complications: Secondary | ICD-10-CM | POA: Diagnosis not present

## 2014-09-26 DIAGNOSIS — I10 Essential (primary) hypertension: Secondary | ICD-10-CM | POA: Diagnosis not present

## 2014-09-26 DIAGNOSIS — F17200 Nicotine dependence, unspecified, uncomplicated: Secondary | ICD-10-CM

## 2014-09-26 DIAGNOSIS — Z8673 Personal history of transient ischemic attack (TIA), and cerebral infarction without residual deficits: Secondary | ICD-10-CM

## 2014-09-26 LAB — POCT GLYCOSYLATED HEMOGLOBIN (HGB A1C): HEMOGLOBIN A1C: 8.1

## 2014-09-26 NOTE — Progress Notes (Signed)
   Subjective:    Patient ID: Jon [name], male    DOB: 06-22-1945, 69 y.o.   MRN: [id number]  HPI Diabetes - no hypoglycemic events. No wounds or sores that are not healing well. No increased thirst or urination. Checking glucose at home. Home sugars are running between 130 06/30/1966. He admits he's been eating a lot more sweets than usual. He does not exercise. Des any hypoglycemic events. He is taking medications as prescribed without any side effects.  Tob abuse- not ready to quit.    Hypertension- Pt denies chest pain, SOB, dizziness, or heart palpitations.  Taking meds as directed w/o problems.  Denies medication side effects.    Fell playing softball when he started running. Always hurts over the right lateral low back.  Occ radiates to the side.  Happened a month ago and has been getting better gradually.    Review of Systems     Objective:   Physical Exam  Constitutional: He is oriented to person, place, and time. He appears well-developed and well-nourished.  HENT:  Head: Normocephalic and atraumatic.  Neck: Neck supple. No thyromegaly present.  Cardiovascular: Normal rate, regular rhythm and normal heart sounds.   Pulmonary/Chest: Effort normal and breath sounds normal.  Musculoskeletal:  Points to area near the right paraspinous muscle near lumbar spine.  Able to get on and off the exam table.   Lymphadenopathy:    He has no cervical adenopathy.  Neurological: He is alert and oriented to person, place, and time.  Skin: Skin is warm and dry.  Psychiatric: He has a normal mood and affect. His behavior is normal.          Assessment & Plan:  DM- Uncontrolled.  A1C is 8.1 today. he is on a statin and ACE inhibitor. Last A1C was 6.8.  Discussed adjusting his medication. He declined.  Says he really wants to work on his diet.    HTN- well controlled.  Continue current regimen.   Tob abuse - Declined to quit.   Low back pain - Given home stretches for low back pain.  He is already some better.    Hx of stroke - discussed the importance of being on a statin.   Prenvar 13

## 2014-09-27 ENCOUNTER — Other Ambulatory Visit: Payer: Self-pay | Admitting: Family Medicine

## 2014-10-04 ENCOUNTER — Other Ambulatory Visit: Payer: Self-pay | Admitting: Family Medicine

## 2014-10-31 ENCOUNTER — Other Ambulatory Visit: Payer: Self-pay | Admitting: Family Medicine

## 2014-12-19 ENCOUNTER — Encounter: Payer: Self-pay | Admitting: Family Medicine

## 2014-12-19 ENCOUNTER — Ambulatory Visit (INDEPENDENT_AMBULATORY_CARE_PROVIDER_SITE_OTHER): Payer: Medicare Other | Admitting: Family Medicine

## 2014-12-19 VITALS — BP 108/79 | HR 86 | Ht 73.0 in | Wt 216.0 lb

## 2014-12-19 DIAGNOSIS — I10 Essential (primary) hypertension: Secondary | ICD-10-CM | POA: Diagnosis not present

## 2014-12-19 DIAGNOSIS — I959 Hypotension, unspecified: Secondary | ICD-10-CM | POA: Diagnosis not present

## 2014-12-19 DIAGNOSIS — E119 Type 2 diabetes mellitus without complications: Secondary | ICD-10-CM | POA: Diagnosis not present

## 2014-12-19 LAB — POCT GLYCOSYLATED HEMOGLOBIN (HGB A1C)

## 2014-12-19 NOTE — Progress Notes (Signed)
Subjective:    Patient ID: Jon [name], male    DOB: August 02, 1945, 69 y.o.   MRN: [id number]  HPI Diabetes - no hypoglycemic events. No wounds or sores that are not healing well. No increased thirst or urination. Checking glucose at home. Taking medications as prescribed without any side effects. Last A1c was elevated at 8.1. He declined any changes to his medications that time and really wanted to work on his diet and exercise. No recent infections.  AM sugars in the 120-150s.   Hypertension- Pt denies chest pain, SOB, dizziness, or heart palpitations.  Taking meds as directed w/o problems.  Denies medication side effects.  He has lost about 3 lbs.   Went to play golf about 3 weeks ago when it was 97 degree.  In the game started to get really lightheaded and felt like when  He would stand up he was going to pass out. He also had a migraine with aura for the first time in years that was triggered while he was playing golf that day.  Review of Systems  BP 108/79 mmHg  Pulse 86  Ht 6\' 1"  (1.854 m)  Wt 216 lb (97.977 kg)  BMI 28.50 kg/m2  SpO2 98%    Allergies  Allergen Reactions  . Gabapentin Other (See Comments)    Nightmares.     Past Medical History  Diagnosis Date  . Stroke 7/11  . Epididymitis   . Diabetes mellitus   . Hypertension   . Hyperlipemia     Past Surgical History  Procedure Laterality Date  . Pilonidal cyst excision  1960, 61, 62, 63  . Vasectomy      History   Social History  . Marital Status: Divorced    Spouse Name: N/A  . Number of Children: 2  . Years of Education: N/A   Occupational History  . Retired    Social History Main Topics  . Smoking status: Current Every Day Smoker    Types: Cigarettes  . Smokeless tobacco: Never Used     Comment: Counseling sheet given in exam room 07-2011   . Alcohol Use: No  . Drug Use: No  . Sexual Activity: Not on file   Other Topics Concern  . Not on file   Social History Narrative   Daily caffeine      Family History  Problem Relation Age of Onset  . Diabetes Other     family hx of  . Colon cancer Neg Hx     Outpatient Encounter Prescriptions as of 12/19/2014  Medication Sig  . ACCU-CHEK AVIVA PLUS test strip USE AS DIRECTED TWICE DAILY TO  CHECK BLOOD  SUGAR  . clopidogrel (PLAVIX) 75 MG tablet TAKE ONE TABLET BY MOUTH ONCE DAILY  . fish oil-omega-3 fatty acids 1000 MG capsule Take 4 g by mouth daily.  [name] glipiZIDE (GLUCOTROL) 5 MG tablet TAKE ONE TABLET BY MOUTH TWICE DAILY WITH A MEAL  . INVOKAMET 50-500 MG TABS TAKE ONE TABLET BY MOUTH TWICE DAILY  . lisinopril (PRINIVIL,ZESTRIL) 5 MG tablet TAKE ONE TABLET BY MOUTH ONCE DAILY  . simvastatin (ZOCOR) 40 MG tablet TAKE ONE TABLET BY MOUTH AT BEDTIME   No facility-administered encounter medications on file as of 12/19/2014.          Objective:   Physical Exam  Constitutional: He is oriented to person, place, and time. He appears well-developed and well-nourished.  HENT:  Head: Normocephalic and atraumatic.  Cardiovascular: Normal rate, regular rhythm and  normal heart sounds.   Pulmonary/Chest: Effort normal and breath sounds normal.  Neurological: He is alert and oriented to person, place, and time.  Skin: Skin is warm and dry.  Psychiatric: He has a normal mood and affect. His behavior is normal.        Assessment & Plan:  DM - Uncontrolled. Unfortunately, our fingerstick machine would not read his hemoglobin A1c. We attempted to repeat. Will send to lab for venipuncture for confirmation. We'll call with results once available and will adjust medications at that time. On statin.    Reminded to due eye exam.   HTN - Bp low today and sounds like he may have had some episodes of hypotension.  Will stop the lisinopril for now. His eyes had a negative microalbumin. Will monitor.   Dizzy episodes.  Most likely due to low blood pressure. I'll go and have his stop his lisinopril for now and keep his follow-up appointment in 3  months. Also consider could be secondary to mild dehydration.

## 2014-12-20 LAB — HEMOGLOBIN A1C
Hgb A1c MFr Bld: 8.2 % — ABNORMAL HIGH (ref <5.7)
Mean Plasma Glucose: 189 mg/dL — ABNORMAL HIGH (ref <117)

## 2014-12-23 ENCOUNTER — Other Ambulatory Visit: Payer: Self-pay | Admitting: Family Medicine

## 2015-01-04 ENCOUNTER — Ambulatory Visit: Payer: Medicare Other | Admitting: Family Medicine

## 2015-01-05 ENCOUNTER — Other Ambulatory Visit: Payer: Self-pay | Admitting: Family Medicine

## 2015-01-17 DIAGNOSIS — E119 Type 2 diabetes mellitus without complications: Secondary | ICD-10-CM | POA: Diagnosis not present

## 2015-01-17 DIAGNOSIS — H524 Presbyopia: Secondary | ICD-10-CM | POA: Diagnosis not present

## 2015-01-17 LAB — HM DIABETES EYE EXAM

## 2015-01-19 ENCOUNTER — Encounter: Payer: Self-pay | Admitting: Osteopathic Medicine

## 2015-01-19 ENCOUNTER — Ambulatory Visit (INDEPENDENT_AMBULATORY_CARE_PROVIDER_SITE_OTHER): Payer: Medicare Other | Admitting: Osteopathic Medicine

## 2015-01-19 ENCOUNTER — Ambulatory Visit: Payer: Medicare Other | Admitting: Family Medicine

## 2015-01-19 ENCOUNTER — Ambulatory Visit (INDEPENDENT_AMBULATORY_CARE_PROVIDER_SITE_OTHER): Payer: Medicare Other

## 2015-01-19 VITALS — BP 126/77 | HR 72 | Ht 73.0 in | Wt 219.0 lb

## 2015-01-19 DIAGNOSIS — R0781 Pleurodynia: Secondary | ICD-10-CM

## 2015-01-19 DIAGNOSIS — S29011A Strain of muscle and tendon of front wall of thorax, initial encounter: Secondary | ICD-10-CM

## 2015-01-19 DIAGNOSIS — R079 Chest pain, unspecified: Secondary | ICD-10-CM

## 2015-01-19 NOTE — Progress Notes (Signed)
HPI: Jon [name] is a 69 y.o. male who presents to [facility]  today for chief complaint of: No chief complaint on file.  . Location: L side under ribs . Severity: moderate . Duration: 4 days ago, getting worse . Context: hx pleurisy in 1978 . Modifying factors: worse with twisting and turning, doesn't hurt unless he moves the wrong way . Assoc signs/symptoms: no fever/chills, no coughing   Past medical, social and family history reviewed: Past Medical History  Diagnosis Date  . Stroke 7/11  . Epididymitis   . Diabetes mellitus   . Hypertension   . Hyperlipemia    Past Surgical History  Procedure Laterality Date  . Pilonidal cyst excision  1960, 61, 62, 63  . Vasectomy     Social History  Substance Use Topics  . Smoking status: Current Every Day Smoker    Types: Cigarettes  . Smokeless tobacco: Never Used     Comment: Counseling sheet given in exam room 07-2011   . Alcohol Use: No   Family History  Problem Relation Age of Onset  . Diabetes Other     family hx of  . Colon cancer Neg Hx     Current Outpatient Prescriptions  Medication Sig Dispense Refill  . ACCU-CHEK AVIVA PLUS test strip USE AS DIRECTED TWICE DAILY TO  CHECK BLOOD  SUGAR 100 each 1  . clopidogrel (PLAVIX) 75 MG tablet TAKE ONE TABLET BY MOUTH ONCE DAILY 90 tablet 0  . fish oil-omega-3 fatty acids 1000 MG capsule Take 4 g by mouth daily.    [name] glipiZIDE (GLUCOTROL) 5 MG tablet TAKE ONE TABLET BY MOUTH TWICE DAILY WITH MEALS 60 tablet 2  . INVOKAMET 50-500 MG TABS TAKE ONE TABLET BY MOUTH TWICE DAILY 60 tablet 2  . simvastatin (ZOCOR) 40 MG tablet TAKE ONE TABLET BY MOUTH AT BEDTIME 90 tablet 0   No current facility-administered medications for this visit.   Allergies  Allergen Reactions  . Gabapentin Other (See Comments)    Nightmares.       Review of Systems: CONSTITUTIONAL: Neg fever/chills, no unintentional weight changesCARDIAC: No chest  pain/pressure/palpitations, no orthopnea RESPIRATORY: No cough/shortness of breath/wheeze, pain with deep breaths under L breast see HPI GASTROINTESTINAL: No nausea/vomiting/abdominal pain/blood in stool/diarrhea/constipation MUSCULOSKELETAL: (+)rib pain under L breast, see HPI SKIN: No rash/wounds/concerning lesions   Exam:  BP 126/77 mmHg  Pulse 72  Ht 6\' 1"  (1.854 m)  Wt 219 lb (99.338 kg)  BMI 28.90 kg/m2  SpO2 98% Constitutional: VSS, see above. General Appearance: alert, well-developed, well-nourished, NAD Respiratory: Normal respiratory effort. No dullness/hyper-resonance to percussion. Breath sounds normal, no wheeze/rhonchi/rales. Cardiovascular: S1/S2 normal, no murmur/rub/gallop auscultated. RRR. No carotid bruit or JVD. No abdominal aortic bruit. Pedal pulse II/IV bilaterally DP and PT. No lower extremity edema. Gastrointestinal: Nontender, no masses.  Msk: (+)TTP rib L approx Rib 7/8, just under and lateral to nipple, also hurts here with dep inspiration  No results found for this or any previous visit (from the past 72 hour(s)).    ASSESSMENT/PLAN:  Muscle strain of chest wall, initial encounter  Pleural pain - Plan: DG Chest 2 View    Chest x-ray personally reviewed with the patient, I see no concern for pneumonia, skeletal abnormality, obvious mass. Possibly some hilar lymph nodes, with vascular/airway markings, cardiac size normal, await radiology over read.  During patient's symptoms and physical exam I doubt that this is not pleuritic problem, more suspicious for musculoskeletal chest wall  pain between ribs 7 and 8. Unable to perform any OMT due to patient's extreme tenderness, educated can take low-dose NSAIDs due to diminished kidney function, can take Tylenol. Patient declines any other medical management for me, was more concerned making sure this wasn't a pneumonia or something else serious. RTC if no better 1 week. ER precuations reviewed re: chest pain/SOB.

## 2015-01-24 ENCOUNTER — Other Ambulatory Visit: Payer: Self-pay | Admitting: Family Medicine

## 2015-01-27 ENCOUNTER — Other Ambulatory Visit: Payer: Self-pay | Admitting: Family Medicine

## 2015-01-29 ENCOUNTER — Telehealth: Payer: Self-pay

## 2015-01-29 ENCOUNTER — Ambulatory Visit: Payer: Medicare Other | Admitting: Family Medicine

## 2015-01-29 NOTE — Telephone Encounter (Signed)
Pleurisy is inflammation of the lungs and not an infection. Antibiotic won't "cure" it. And it can take several weeks ot get better.  Usually treatment is an antiinflammatory and cough suppressant if needed.  But if feels chest pain is from something else then may need to be seen.

## 2015-01-29 NOTE — Telephone Encounter (Signed)
Jon [name] called and reports still having chest soreness. He believes it to be pleurisy and would like an antibiotic called in. He was seen by Dr Sheppard Coil more than a week ago and had a chest xray. I scheduled him for 1:30 today but he rather not keep this appointment.

## 2015-01-29 NOTE — Telephone Encounter (Signed)
Patient advised of recommendations.  

## 2015-03-01 ENCOUNTER — Ambulatory Visit (INDEPENDENT_AMBULATORY_CARE_PROVIDER_SITE_OTHER): Payer: Medicare Other | Admitting: Family Medicine

## 2015-03-01 VITALS — BP 148/91 | HR 68

## 2015-03-01 DIAGNOSIS — I10 Essential (primary) hypertension: Secondary | ICD-10-CM

## 2015-03-01 MED ORDER — LISINOPRIL 10 MG PO TABS: 10.0000 mg | ORAL_TABLET | Freq: Every day | ORAL | Status: DC

## 2015-03-01 NOTE — Progress Notes (Signed)
Patient walked into clinic today stating he took his blood pressure at home and it was "very high." I had patient added onto the nurse visit schedule for a blood pressure check. Pt brought him home monitor, a wrist blood pressure cuff, with him to show me some of the readings he has been getting. They are as follows:  170/90 pulse 68 157/88 pulse 64 155/86 pulse 62 143/84 pulse 60  We took his blood pressure in office today, his value was 148/91 pulse 69. To test a theory, we used his home cuff to see what reading we would get. His home cuff measured his blood pressure at 155/84 pulse 69. Patient reports he used to be on Lisinopril 5mg  and was taken off it over the summer because his blood pressure "got too low." He reports for the last three days he has been taking the Lisinopril 5mg  tablets in the am from the old Rx he had at home. Pt reports he took this Rx today prior to coming into the office, about 0700. Pt reports no headache, blurred vision, dizziness, or any other abnormal changes. Does have an upcoming appt with PCP in a couple weeks, advised this would need to be addressed in more detail at that visit. Will route to PCP to see if he should continue taking the Lisinopril 5mg  tablets, if so he will need a new Rx sent to [facility] on [address] in [place].

## 2015-03-01 NOTE — Progress Notes (Signed)
   Subjective:    Patient ID: Jon [name], male    DOB: 1946/01/12, 69 y.o.   MRN: [id number]  HPI    Review of Systems     Objective:   Physical Exam        Assessment & Plan:   uncontrolled hypertension- yes, continue the lisinopril. In fact I sent a new perception to the pharmacy for 10 mg dose. At the follow-up in one month with me for office visit for blood pressure. Beatrice Lecher, MD

## 2015-03-14 ENCOUNTER — Other Ambulatory Visit: Payer: Self-pay | Admitting: *Deleted

## 2015-03-14 MED ORDER — CLOPIDOGREL BISULFATE 75 MG PO TABS: 75.0000 mg | ORAL_TABLET | Freq: Every day | ORAL | Status: DC

## 2015-03-14 MED ORDER — SIMVASTATIN 40 MG PO TABS: 40.0000 mg | ORAL_TABLET | Freq: Every day | ORAL | Status: DC

## 2015-03-14 MED ORDER — CANAGLIFLOZIN-METFORMIN HCL 50-500 MG PO TABS: 1.0000 | ORAL_TABLET | Freq: Two times a day (BID) | ORAL | Status: DC

## 2015-03-14 MED ORDER — GLUCOSE BLOOD VI STRP: ORAL_STRIP | Status: DC

## 2015-03-14 MED ORDER — GLIPIZIDE 5 MG PO TABS: 5.0000 mg | ORAL_TABLET | Freq: Two times a day (BID) | ORAL | Status: DC

## 2015-03-21 ENCOUNTER — Ambulatory Visit (INDEPENDENT_AMBULATORY_CARE_PROVIDER_SITE_OTHER): Payer: Medicare Other | Admitting: Family Medicine

## 2015-03-21 ENCOUNTER — Encounter: Payer: Self-pay | Admitting: Family Medicine

## 2015-03-21 VITALS — BP 138/73 | HR 70 | Ht 73.0 in | Wt 223.5 lb

## 2015-03-21 DIAGNOSIS — E119 Type 2 diabetes mellitus without complications: Secondary | ICD-10-CM | POA: Diagnosis not present

## 2015-03-21 DIAGNOSIS — Z1159 Encounter for screening for other viral diseases: Secondary | ICD-10-CM | POA: Diagnosis not present

## 2015-03-21 DIAGNOSIS — I10 Essential (primary) hypertension: Secondary | ICD-10-CM | POA: Diagnosis not present

## 2015-03-21 LAB — POCT UA - MICROALBUMIN
Albumin/Creatinine Ratio, Urine, POC: <30
Creatinine, POC: 50 mg/dL
Microalbumin Ur, POC: 10 mg/L

## 2015-03-21 LAB — CBC WITH DIFFERENTIAL/PLATELET
Basophils Absolute: 0.1 10*3/uL (ref 0.0–0.1)
Basophils Relative: 1 % (ref 0–1)
EOS PCT: 3 % (ref 0–5)
Eosinophils Absolute: 0.2 10*3/uL (ref 0.0–0.7)
HEMATOCRIT: 46.5 % (ref 39.0–52.0)
Hemoglobin: 16.3 g/dL (ref 13.0–17.0)
LYMPHS ABS: 2.3 10*3/uL (ref 0.7–4.0)
LYMPHS PCT: 30 % (ref 12–46)
MCH: 33.1 pg (ref 26.0–34.0)
MCHC: 35.1 g/dL (ref 30.0–36.0)
MCV: 94.5 fL (ref 78.0–100.0)
MONO ABS: 0.5 10*3/uL (ref 0.1–1.0)
MONOS PCT: 6 % (ref 3–12)
MPV: 10.8 fL (ref 8.6–12.4)
Neutro Abs: 4.6 10*3/uL (ref 1.7–7.7)
Neutrophils Relative %: 60 % (ref 43–77)
Platelets: 182 10*3/uL (ref 150–400)
RBC: 4.92 MIL/uL (ref 4.22–5.81)
RDW: 12.6 % (ref 11.5–15.5)
WBC: 7.6 10*3/uL (ref 4.0–10.5)

## 2015-03-21 LAB — BASIC METABOLIC PANEL
BUN: 11 mg/dL (ref 7–25)
CHLORIDE: 105 mmol/L (ref 98–110)
CO2: 25 mmol/L (ref 20–31)
CREATININE: 1.26 mg/dL — AB (ref 0.70–1.25)
Calcium: 9.5 mg/dL (ref 8.6–10.3)
GLUCOSE: 166 mg/dL — AB (ref 65–99)
POTASSIUM: 4.7 mmol/L (ref 3.5–5.3)
Sodium: 139 mmol/L (ref 135–146)

## 2015-03-21 LAB — POCT GLYCOSYLATED HEMOGLOBIN (HGB A1C)

## 2015-03-21 MED ORDER — CANAGLIFLOZIN-METFORMIN HCL 150-500 MG PO TABS: 1.0000 | ORAL_TABLET | Freq: Two times a day (BID) | ORAL | Status: DC

## 2015-03-21 NOTE — Progress Notes (Signed)
   Subjective:    Patient ID: Jon [name], male    DOB: March 17, 1946, 69 y.o.   MRN: [id number]  HPI Diabetes - no hypoglycemic events. No wounds or sores that are not healing well. No increased thirst or urination. Checking glucose at home. Taking medications as prescribed without any side effects.  Admits   Hypertension- Pt denies chest pain, SOB, dizziness, or heart palpitations.  Taking meds as directed w/o problems.  Denies medication side effects. Brought in home cuff to compare.  He bougth a new one.    Review of Systems     Objective:   Physical Exam  Constitutional: He is oriented to person, place, and time. He appears well-developed and well-nourished.  HENT:  Head: Normocephalic and atraumatic.  Cardiovascular: Normal rate, regular rhythm and normal heart sounds.   Pulmonary/Chest: Effort normal and breath sounds normal.  Neurological: He is alert and oriented to person, place, and time.  Skin: Skin is warm and dry.  Psychiatric: He has a normal mood and affect. His behavior is normal.          Assessment & Plan:  DM-  Due for foot exam, urine micro today, flu vaccine given. Reminded to get his eye exam. Continue to work on diet and exercise.   HTN - well controlled.  Continue current regimen.

## 2015-03-22 ENCOUNTER — Encounter: Payer: Self-pay | Admitting: Family Medicine

## 2015-03-22 DIAGNOSIS — N183 Chronic kidney disease, stage 3 unspecified: Secondary | ICD-10-CM | POA: Insufficient documentation

## 2015-03-22 DIAGNOSIS — E1122 Type 2 diabetes mellitus with diabetic chronic kidney disease: Secondary | ICD-10-CM | POA: Insufficient documentation

## 2015-03-22 LAB — HEMOGLOBIN A1C
Hgb A1c MFr Bld: 8.3 % — ABNORMAL HIGH (ref <5.7)
Mean Plasma Glucose: 192 mg/dL — ABNORMAL HIGH (ref <117)

## 2015-03-22 MED ORDER — AMBULATORY NON FORMULARY MEDICATION: Status: DC

## 2015-03-22 NOTE — Addendum Note (Signed)
Addended by: Teddy Spike on: 03/22/2015 09:40 AM   Modules accepted: Orders

## 2015-03-29 ENCOUNTER — Other Ambulatory Visit: Payer: Self-pay | Admitting: *Deleted

## 2015-03-29 ENCOUNTER — Other Ambulatory Visit: Payer: Self-pay | Admitting: Family Medicine

## 2015-03-29 DIAGNOSIS — E119 Type 2 diabetes mellitus without complications: Secondary | ICD-10-CM

## 2015-03-29 MED ORDER — GLUCOSE BLOOD VI STRP: ORAL_STRIP | Status: DC

## 2015-04-04 ENCOUNTER — Other Ambulatory Visit: Payer: Self-pay

## 2015-04-04 DIAGNOSIS — E119 Type 2 diabetes mellitus without complications: Secondary | ICD-10-CM

## 2015-04-04 MED ORDER — GLUCOSE BLOOD VI STRP: ORAL_STRIP | Status: DC

## 2015-04-04 MED ORDER — AMBULATORY NON FORMULARY MEDICATION: Status: DC

## 2015-04-10 ENCOUNTER — Other Ambulatory Visit: Payer: Self-pay

## 2015-04-10 DIAGNOSIS — E119 Type 2 diabetes mellitus without complications: Secondary | ICD-10-CM

## 2015-04-10 MED ORDER — LISINOPRIL 10 MG PO TABS: 10.0000 mg | ORAL_TABLET | Freq: Every day | ORAL | Status: DC

## 2015-04-10 MED ORDER — AMBULATORY NON FORMULARY MEDICATION: Status: DC

## 2015-04-12 ENCOUNTER — Other Ambulatory Visit: Payer: Self-pay

## 2015-04-12 MED ORDER — ACCU-CHEK AVIVA PLUS W/DEVICE KIT: 1.0000 | PACK | Freq: Every day | Status: DC

## 2015-04-16 ENCOUNTER — Telehealth: Payer: Self-pay

## 2015-04-16 MED ORDER — CANAGLIFLOZIN-METFORMIN HCL 50-500 MG PO TABS: 1.0000 | ORAL_TABLET | Freq: Two times a day (BID) | ORAL | Status: DC

## 2015-04-16 MED ORDER — CLOPIDOGREL BISULFATE 75 MG PO TABS: 75.0000 mg | ORAL_TABLET | Freq: Every day | ORAL | Status: DC

## 2015-04-16 NOTE — Telephone Encounter (Signed)
Patient walked-in request to have all his medications changed to [facility] instead of [facility]. Patient request to have Clopidogrel Plavix and Invokamet both sent to mail order next time it is time for a refill-vew

## 2015-04-16 NOTE — Telephone Encounter (Signed)
Medication sent.

## 2015-04-17 ENCOUNTER — Other Ambulatory Visit: Payer: Self-pay | Admitting: Family Medicine

## 2015-06-27 ENCOUNTER — Encounter: Payer: Self-pay | Admitting: Family Medicine

## 2015-06-27 ENCOUNTER — Ambulatory Visit (INDEPENDENT_AMBULATORY_CARE_PROVIDER_SITE_OTHER): Payer: Medicare Other | Admitting: Family Medicine

## 2015-06-27 VITALS — BP 133/81 | HR 75 | Ht 73.0 in | Wt 222.0 lb

## 2015-06-27 DIAGNOSIS — E119 Type 2 diabetes mellitus without complications: Secondary | ICD-10-CM

## 2015-06-27 DIAGNOSIS — Z125 Encounter for screening for malignant neoplasm of prostate: Secondary | ICD-10-CM

## 2015-06-27 DIAGNOSIS — F17200 Nicotine dependence, unspecified, uncomplicated: Secondary | ICD-10-CM

## 2015-06-27 DIAGNOSIS — R05 Cough: Secondary | ICD-10-CM | POA: Diagnosis not present

## 2015-06-27 DIAGNOSIS — Z1159 Encounter for screening for other viral diseases: Secondary | ICD-10-CM

## 2015-06-27 DIAGNOSIS — I10 Essential (primary) hypertension: Secondary | ICD-10-CM | POA: Diagnosis not present

## 2015-06-27 DIAGNOSIS — R059 Cough, unspecified: Secondary | ICD-10-CM

## 2015-06-27 LAB — POCT GLYCOSYLATED HEMOGLOBIN (HGB A1C): HEMOGLOBIN A1C: 7.7

## 2015-06-27 MED ORDER — CANAGLIFLOZIN-METFORMIN HCL 150-500 MG PO TABS: 1.0000 | ORAL_TABLET | Freq: Two times a day (BID) | ORAL | Status: DC

## 2015-06-27 NOTE — Progress Notes (Signed)
   Subjective:    Patient ID: Jon [name], male    DOB: November 16, 1945, 70 y.o.   MRN: [id number]  HPI Diabetes - no hypoglycemic events. No wounds or sores that are not healing well. No increased thirst or urination. Checking glucose at home. Taking medications as prescribed without any side effects.  Cough and inc SOB x  3 months.  Feels like his lungs are more "sensitive" .  Feels like he is coughing moe.    Hypertension- Pt denies chest pain, SOB, dizziness, or heart palpitations.  Taking meds as directed w/o problems.  Denies medication side effects.     Review of Systems     Objective:   Physical Exam  Constitutional: He is oriented to person, place, and time. He appears well-developed and well-nourished.  HENT:  Head: Normocephalic and atraumatic.  Cardiovascular: Normal rate, regular rhythm and normal heart sounds.   Pulmonary/Chest: Effort normal and breath sounds normal.  Neurological: He is alert and oriented to person, place, and time.  Skin: Skin is warm and dry.  Psychiatric: He has a normal mood and affect. His behavior is normal.          Assessment & Plan:  DM- Improved with A1C is 7.7.  Will increase the invokamet to the 150/500.    Cough - chest is clear. CXR is sept showed some pleural thickening. He may have some restriction.  Recommend spirometry     HTN - well controlled.  Continue current regimen.    tob abuse - encouraged smoking cessation.

## 2015-07-23 ENCOUNTER — Other Ambulatory Visit: Payer: Medicare Other

## 2015-08-09 ENCOUNTER — Other Ambulatory Visit: Payer: Medicare Other

## 2015-09-05 ENCOUNTER — Other Ambulatory Visit: Payer: Self-pay | Admitting: Family Medicine

## 2015-09-17 ENCOUNTER — Other Ambulatory Visit: Payer: Self-pay | Admitting: Family Medicine

## 2015-10-31 ENCOUNTER — Ambulatory Visit (INDEPENDENT_AMBULATORY_CARE_PROVIDER_SITE_OTHER): Payer: Medicare Other | Admitting: Family Medicine

## 2015-10-31 ENCOUNTER — Ambulatory Visit (INDEPENDENT_AMBULATORY_CARE_PROVIDER_SITE_OTHER): Payer: Medicare Other

## 2015-10-31 ENCOUNTER — Encounter: Payer: Self-pay | Admitting: Family Medicine

## 2015-10-31 VITALS — BP 130/73 | HR 78 | Wt 219.0 lb

## 2015-10-31 DIAGNOSIS — M4184 Other forms of scoliosis, thoracic region: Secondary | ICD-10-CM | POA: Diagnosis not present

## 2015-10-31 DIAGNOSIS — J209 Acute bronchitis, unspecified: Secondary | ICD-10-CM

## 2015-10-31 DIAGNOSIS — R05 Cough: Secondary | ICD-10-CM | POA: Diagnosis not present

## 2015-10-31 MED ORDER — PREDNISONE 10 MG PO TABS
30.0000 mg | ORAL_TABLET | Freq: Every day | ORAL | Status: DC
Start: 2015-10-31 — End: 2015-12-19

## 2015-10-31 MED ORDER — ALBUTEROL SULFATE HFA 108 (90 BASE) MCG/ACT IN AERS: 2.0000 | INHALATION_SPRAY | Freq: Four times a day (QID) | RESPIRATORY_TRACT | Status: DC | PRN

## 2015-10-31 MED ORDER — BENZONATATE 200 MG PO CAPS: 200.0000 mg | ORAL_CAPSULE | Freq: Three times a day (TID) | ORAL | Status: DC | PRN

## 2015-10-31 MED ORDER — AZITHROMYCIN 250 MG PO TABS: 250.0000 mg | ORAL_TABLET | Freq: Every day | ORAL | Status: DC

## 2015-10-31 NOTE — Progress Notes (Signed)
Quick Note:  Xray does not show pneumonia. It does show some evidence of bronchitis. ______

## 2015-10-31 NOTE — Progress Notes (Signed)
     Jon [name] is a 70 y.o. male who presents to [facility] today for cough congestion runny nose. Patient does note wheezing and notes the cough is productive. He notes occasional shortness of breath. He has not really tried any medications yet. No fevers or chills vomiting diarrhea or chest pain. Symptoms are consistent with previous episodes of bronchitis. Patient is a current smoker.   Past Medical History  Diagnosis Date  . Stroke (HCC) 7/11  . Epididymitis   . Diabetes mellitus   . Hypertension   . Hyperlipemia    Past Surgical History  Procedure Laterality Date  . Pilonidal cyst excision  1960, 61, 62, 63  . Vasectomy     Social History  Substance Use Topics  . Smoking status: Current Every Day Smoker    Types: Cigarettes  . Smokeless tobacco: Never Used     Comment: Counseling sheet given in exam room 07-2011   . Alcohol Use: No   family history includes Diabetes in his other. There is no history of Colon cancer.  ROS as above:  Medications: Current Outpatient Prescriptions  Medication Sig Dispense Refill  . Blood Glucose Monitoring Suppl (ACCU-CHEK AVIVA PLUS) w/Device KIT Use daily 1 kit 0  . Canagliflozin-Metformin HCl (INVOKAMET) 150-500 MG TABS Take 1 tablet by mouth 2 (two) times daily. 180 tablet 1  . clopidogrel (PLAVIX) 75 MG tablet Take 1 tablet by mouth  daily 90 tablet 2  . fish oil-omega-3 fatty acids 1000 MG capsule Take 4 g by mouth daily.    . glipiZIDE (GLUCOTROL) 5 MG tablet TAKE ONE TABLET BY MOUTH TWICE DAILY WITH MEALS 60 tablet 0  . glucose blood (ACCU-CHEK AVIVA PLUS) test strip Test blood sugar twice daily Dx code: E11.9 Dx: DM type 2 100 each 11  . lisinopril (PRINIVIL,ZESTRIL) 10 MG tablet Take 1 tablet (10 mg total) by mouth daily. 90 tablet 3  . simvastatin (ZOCOR) 40 MG tablet Take 1 tablet (40 mg total) by mouth  at bedtime. 90 tablet 2  . albuterol (PROVENTIL HFA;VENTOLIN HFA) 108 (90 Base) MCG/ACT inhaler Inhale 2 puffs into the lungs every 6 (six) hours as needed for wheezing or shortness of breath. 1 Inhaler 0  . azithromycin (ZITHROMAX) 250 MG tablet Take 1 tablet (250 mg total) by mouth daily. Take first 2 tablets together, then 1 every day until finished. 6 tablet 0  . benzonatate (TESSALON) 200 MG capsule Take 1 capsule (200 mg total) by mouth 3 (three) times daily as needed for cough. 45 capsule 0  . predniSONE (DELTASONE) 10 MG tablet Take 3 tablets (30 mg total) by mouth daily with breakfast. 15 tablet 0   No current facility-administered medications for this visit.   Allergies  Allergen Reactions  . Gabapentin Other (See Comments)    Nightmares.      Exam:  BP 130/73 mmHg  Pulse 78  Wt 219 lb (99.338 kg)  SpO2 99% Gen: Well NAD Nontoxic appearing HEENT: EOMI,  MMM normal posterior pharynx Lungs: Normal work of breathing. Slight wheezing present bilaterally Heart: RRR no MRG Abd: NABS, Soft. Nondistended, Nontender Exts: Brisk capillary refill, warm and well perfused.   No results found for this or any previous visit (from the past 24 hour(s)). No results found.    Assessment and Plan: 70 y.o. male with bronchitis possible early COPD exacerbation. Treat with prednisone albuterol Tessalon Perles and azithromycin antibiotics. Chest x-ray pending. Follow-up with   PCP recommended smoking cessation.  Discussed warning signs or symptoms. Please see discharge instructions. Patient expresses understanding.    

## 2015-10-31 NOTE — Patient Instructions (Signed)
Thank you for coming in today. Get xray today.  Take the medicine.  Follow up with Dr Jerilynn Mages soon.  Call or go to the emergency room if you get worse, have trouble breathing, have chest pains, or palpitations.   Acute Bronchitis Bronchitis is inflammation of the airways that extend from the windpipe into the lungs (bronchi). The inflammation often causes mucus to develop. This leads to a cough, which is the most common symptom of bronchitis.  In acute bronchitis, the condition usually develops suddenly and goes away over time, usually in a couple weeks. Smoking, allergies, and asthma can make bronchitis worse. Repeated episodes of bronchitis may cause further lung problems.  CAUSES Acute bronchitis is most often caused by the same virus that causes a cold. The virus can spread from person to person (contagious) through coughing, sneezing, and touching contaminated objects. SIGNS AND SYMPTOMS   Cough.   Fever.   Coughing up mucus.   Body aches.   Chest congestion.   Chills.   Shortness of breath.   Sore throat.  DIAGNOSIS  Acute bronchitis is usually diagnosed through a physical exam. Your health care provider will also ask you questions about your medical history. Tests, such as chest X-rays, are sometimes done to rule out other conditions.  TREATMENT  Acute bronchitis usually goes away in a couple weeks. Oftentimes, no medical treatment is necessary. Medicines are sometimes given for relief of fever or cough. Antibiotic medicines are usually not needed but may be prescribed in certain situations. In some cases, an inhaler may be recommended to help reduce shortness of breath and control the cough. A cool mist vaporizer may also be used to help thin bronchial secretions and make it easier to clear the chest.  HOME CARE INSTRUCTIONS  Get plenty of rest.   Drink enough fluids to keep your urine clear or pale yellow (unless you have a medical condition that requires fluid  restriction). Increasing fluids may help thin your respiratory secretions (sputum) and reduce chest congestion, and it will prevent dehydration.   Take medicines only as directed by your health care provider.  If you were prescribed an antibiotic medicine, finish it all even if you start to feel better.  Avoid smoking and secondhand smoke. Exposure to cigarette smoke or irritating chemicals will make bronchitis worse. If you are a smoker, consider using nicotine gum or skin patches to help control withdrawal symptoms. Quitting smoking will help your lungs heal faster.   Reduce the chances of another bout of acute bronchitis by washing your hands frequently, avoiding people with cold symptoms, and trying not to touch your hands to your mouth, nose, or eyes.   Keep all follow-up visits as directed by your health care provider.  SEEK MEDICAL CARE IF: Your symptoms do not improve after 1 week of treatment.  SEEK IMMEDIATE MEDICAL CARE IF:  You develop an increased fever or chills.   You have chest pain.   You have severe shortness of breath.  You have bloody sputum.   You develop dehydration.  You faint or repeatedly feel like you are going to pass out.  You develop repeated vomiting.  You develop a severe headache. MAKE SURE YOU:   Understand these instructions.  Will watch your condition.  Will get help right away if you are not doing well or get worse.   This information is not intended to replace advice given to you by your health care provider. Make sure you discuss any questions you have  with your health care provider.   Document Released: 06/05/2004 Document Revised: 05/19/2014 Document Reviewed: 10/19/2012 Elsevier Interactive Patient Education [employer].

## 2015-10-31 NOTE — Addendum Note (Signed)
Addended by: Teddy Spike on: 10/31/2015 12:34 PM   Modules accepted: Orders

## 2015-11-12 ENCOUNTER — Ambulatory Visit: Payer: Medicare Other | Admitting: Family Medicine

## 2015-11-26 ENCOUNTER — Ambulatory Visit: Payer: Medicare Other | Admitting: Family Medicine

## 2015-11-28 ENCOUNTER — Other Ambulatory Visit: Payer: Self-pay

## 2015-11-28 MED ORDER — GLIPIZIDE 5 MG PO TABS: 5.0000 mg | ORAL_TABLET | Freq: Two times a day (BID) | ORAL | Status: DC

## 2015-11-28 MED ORDER — CANAGLIFLOZIN-METFORMIN HCL 150-500 MG PO TABS: 1.0000 | ORAL_TABLET | Freq: Two times a day (BID) | ORAL | Status: DC

## 2015-12-12 ENCOUNTER — Other Ambulatory Visit: Payer: Self-pay | Admitting: Family Medicine

## 2015-12-19 ENCOUNTER — Encounter: Payer: Self-pay | Admitting: Family Medicine

## 2015-12-19 ENCOUNTER — Ambulatory Visit (INDEPENDENT_AMBULATORY_CARE_PROVIDER_SITE_OTHER): Payer: Medicare Other | Admitting: Family Medicine

## 2015-12-19 VITALS — BP 95/65 | HR 78 | Resp 13 | Ht 73.0 in | Wt 215.0 lb

## 2015-12-19 DIAGNOSIS — I10 Essential (primary) hypertension: Secondary | ICD-10-CM

## 2015-12-19 DIAGNOSIS — E1122 Type 2 diabetes mellitus with diabetic chronic kidney disease: Secondary | ICD-10-CM | POA: Diagnosis not present

## 2015-12-19 DIAGNOSIS — E119 Type 2 diabetes mellitus without complications: Secondary | ICD-10-CM | POA: Diagnosis not present

## 2015-12-19 DIAGNOSIS — E1142 Type 2 diabetes mellitus with diabetic polyneuropathy: Secondary | ICD-10-CM

## 2015-12-19 DIAGNOSIS — E785 Hyperlipidemia, unspecified: Secondary | ICD-10-CM

## 2015-12-19 DIAGNOSIS — Z125 Encounter for screening for malignant neoplasm of prostate: Secondary | ICD-10-CM

## 2015-12-19 DIAGNOSIS — N183 Chronic kidney disease, stage 3 (moderate): Secondary | ICD-10-CM

## 2015-12-19 DIAGNOSIS — Z1159 Encounter for screening for other viral diseases: Secondary | ICD-10-CM

## 2015-12-19 LAB — POCT GLYCOSYLATED HEMOGLOBIN (HGB A1C): HEMOGLOBIN A1C: 7.7

## 2015-12-19 MED ORDER — LISINOPRIL 2.5 MG PO TABS
2.5000 mg | ORAL_TABLET | Freq: Every day | ORAL | 1 refills | Status: DC
Start: 2015-12-19 — End: 2016-04-22

## 2015-12-19 NOTE — Progress Notes (Signed)
Subjective:    CC: DM, HTN  HPI: Diabetes - no hypoglycemic events. No wounds or sores that are not healing well. No increased thirst or urination. Checking glucose at home.Sugars in the morning have been running 110 235. He has not seen any highs or lows. Taking medications as prescribed without any side effects.  Hypertension- Pt denies chest pain, SOB, dizziness, or heart palpitations.  Taking meds as directed w/o problems.  Denies medication side effects.  He reports BP has been running low so taknig his lisinopril every other day.   CKD 3-  Do to recheck renal function. He didn't go for labs last time. He urinates frequently but says this concerning the,.   Past medical history, Surgical history, Family history not pertinant except as noted below, Social history, Allergies, and medications have been entered into the medical record, reviewed, and corrections made.   Review of Systems: No fevers, chills, night sweats, weight loss, chest pain, or shortness of breath.   Objective:    General: Well Developed, well nourished, and in no acute distress.  Neuro: Alert and oriented x3, extra-ocular muscles intact, sensation grossly intact.  HEENT: Normocephalic, atraumatic  Skin: Warm and dry, no rashes. Cardiac: Regular rate and rhythm, no murmurs rubs or gallops, no lower extremity edema.  Respiratory: Clear to auscultation bilaterally. Not using accessory muscles, speaking in full sentences.   Impression and Recommendations:    HTN- Well controlled. Continue current regimen. Follow up in 6 mo.    DM- Uncontrolled. A1C is stable at 7.7. He is actually really shocked by this. He really thought his numbers were given a be much better. He says his fastings in the morning have been running between 110 135. He says he hasn't seen any numbers in the 200s like he was previously. He's been tolerating the increase on the Invokamet well without any side effects or problems. Taking his glipizide  regularly. He has [place] but hasn't been going to [employer] like he was before. Urged him to continue to work on diet and exercise to get this back under control. Due to recheck lipids.  CKD 3- Due to recheck renal function   Hyperlipidemia-due to recheck lipids.  Check PSA.  Due Hep C screening.

## 2015-12-25 DIAGNOSIS — Z125 Encounter for screening for malignant neoplasm of prostate: Secondary | ICD-10-CM | POA: Diagnosis not present

## 2015-12-25 DIAGNOSIS — I10 Essential (primary) hypertension: Secondary | ICD-10-CM | POA: Diagnosis not present

## 2015-12-25 DIAGNOSIS — N183 Chronic kidney disease, stage 3 (moderate): Secondary | ICD-10-CM | POA: Diagnosis not present

## 2015-12-25 DIAGNOSIS — E119 Type 2 diabetes mellitus without complications: Secondary | ICD-10-CM | POA: Diagnosis not present

## 2015-12-25 DIAGNOSIS — E1122 Type 2 diabetes mellitus with diabetic chronic kidney disease: Secondary | ICD-10-CM | POA: Diagnosis not present

## 2015-12-25 DIAGNOSIS — E1142 Type 2 diabetes mellitus with diabetic polyneuropathy: Secondary | ICD-10-CM | POA: Diagnosis not present

## 2015-12-26 LAB — COMPLETE METABOLIC PANEL WITH GFR
ALT: 17 U/L (ref 9–46)
AST: 17 U/L (ref 10–35)
Albumin: 4.4 g/dL (ref 3.6–5.1)
Alkaline Phosphatase: 63 U/L (ref 40–115)
BILIRUBIN TOTAL: 0.4 mg/dL (ref 0.2–1.2)
BUN: 14 mg/dL (ref 7–25)
CALCIUM: 9.2 mg/dL (ref 8.6–10.3)
CHLORIDE: 105 mmol/L (ref 98–110)
CO2: 22 mmol/L (ref 20–31)
CREATININE: 1.34 mg/dL — AB (ref 0.70–1.25)
GFR, EST AFRICAN AMERICAN: 62 mL/min (ref >=60)
GFR, Est Non African American: 54 mL/min — ABNORMAL LOW (ref >=60)
Glucose, Bld: 133 mg/dL — ABNORMAL HIGH (ref 65–99)
Potassium: 5 mmol/L (ref 3.5–5.3)
Sodium: 139 mmol/L (ref 135–146)
TOTAL PROTEIN: 6.9 g/dL (ref 6.1–8.1)

## 2015-12-26 LAB — LIPID PANEL
CHOLESTEROL: 145 mg/dL (ref 125–200)
HDL: 41 mg/dL (ref >=40)
LDL CALC: 65 mg/dL (ref <130)
TRIGLYCERIDES: 197 mg/dL — AB (ref <150)
Total CHOL/HDL Ratio: 3.5 Ratio (ref <=5.0)
VLDL: 39 mg/dL — AB (ref <30)

## 2015-12-26 LAB — HEMOGLOBIN A1C
Hgb A1c MFr Bld: 7.4 % — ABNORMAL HIGH (ref <5.7)
Mean Plasma Glucose: 166 mg/dL

## 2015-12-26 LAB — PSA: PSA: 0.2 ng/mL (ref <=4.0)

## 2015-12-26 LAB — HEPATITIS C ANTIBODY: HCV Ab: NEGATIVE

## 2015-12-26 MED ORDER — CANAGLIFLOZIN-METFORMIN HCL 150-1000 MG PO TABS: 1.0000 | ORAL_TABLET | Freq: Two times a day (BID) | ORAL | 6 refills | Status: DC

## 2015-12-26 NOTE — Addendum Note (Signed)
Addended by: Teddy Spike on: 12/26/2015 08:03 AM   Modules accepted: Orders

## 2016-01-12 ENCOUNTER — Other Ambulatory Visit: Payer: Self-pay | Admitting: Family Medicine

## 2016-01-16 ENCOUNTER — Other Ambulatory Visit: Payer: Self-pay | Admitting: *Deleted

## 2016-03-20 ENCOUNTER — Ambulatory Visit: Payer: Medicare Other | Admitting: Family Medicine

## 2016-03-26 ENCOUNTER — Ambulatory Visit (INDEPENDENT_AMBULATORY_CARE_PROVIDER_SITE_OTHER): Payer: Medicare Other | Admitting: Family Medicine

## 2016-03-26 ENCOUNTER — Encounter: Payer: Self-pay | Admitting: Family Medicine

## 2016-03-26 VITALS — BP 117/82 | HR 78 | Ht 73.0 in | Wt 219.0 lb

## 2016-03-26 DIAGNOSIS — E119 Type 2 diabetes mellitus without complications: Secondary | ICD-10-CM | POA: Diagnosis not present

## 2016-03-26 DIAGNOSIS — I10 Essential (primary) hypertension: Secondary | ICD-10-CM | POA: Diagnosis not present

## 2016-03-26 DIAGNOSIS — Z23 Encounter for immunization: Secondary | ICD-10-CM

## 2016-03-26 LAB — POCT UA - MICROALBUMIN
Albumin/Creatinine Ratio, Urine, POC: <30
CREATININE, POC: 50 mg/dL
Microalbumin Ur, POC: 10 mg/L

## 2016-03-26 LAB — POCT GLYCOSYLATED HEMOGLOBIN (HGB A1C): Hemoglobin A1C: 8

## 2016-03-26 MED ORDER — DAPAGLIFLOZIN PRO-METFORMIN ER 10-1000 MG PO TB24: 1.0000 | ORAL_TABLET | Freq: Every day | ORAL | 0 refills | Status: DC

## 2016-03-26 NOTE — Patient Instructions (Signed)
When you run out of your Invokamet, you can start Xigduo XR.  Call us in January.

## 2016-03-26 NOTE — Progress Notes (Signed)
Subjective:    CC: DM  HPI: Diabetes - no hypoglycemic events. No wounds or sores that are not healing well. No increased thirst or urination. Checking glucose at home. Taking medications as prescribed without any side effects. Unfortunately, he is in the donut hole and is not going to be able to purchase his Invega met. He says he has about 10 days worth of his old lower dose of in [place] met. He also has some questions up a TV commercials that he is seen about the wrist of Invokana.  Hypertension- Pt denies chest pain, SOB, dizziness, or heart palpitations.  Taking meds as directed w/o problems.  Denies medication side effects.      Past medical history, Surgical history, Family history not pertinant except as noted below, Social history, Allergies, and medications have been entered into the medical record, reviewed, and corrections made.   Review of Systems: No fevers, chills, night sweats, weight loss, chest pain, or shortness of breath.   Objective:    General: Well Developed, well nourished, and in no acute distress.  Neuro: Alert and oriented x3, extra-ocular muscles intact, sensation grossly intact.  HEENT: Normocephalic, atraumatic  Skin: Warm and dry, no rashes. Cardiac: Regular rate and rhythm, no murmurs rubs or gallops, no lower extremity edema.  Respiratory: Clear to auscultation bilaterally. Not using accessory muscles, speaking in full sentences.   Impression and Recommendations:   DM- Uncontrolled. His A1c is up from previous. It's 8.0 today. We discussed starting insulin. He said absolutely not. He says he will work on his diet and exercise which is what he said when I saw him last time. I did give him a coupon to get Xigduo for 30 day free trial which should hopefully get him close to January and then we can see what's on his formulary. We did discuss the increased risk for amputation with Invokana. Certainly that may affect the drug twice in January. He is at high risk for  peripheral vascular disease as he is a current smoker and poorly controlled diabetic.  On statin and ACE.   HTN - Well controlled. Continue current regimen. Follow up in  3-4 months.

## 2016-04-22 ENCOUNTER — Other Ambulatory Visit: Payer: Self-pay | Admitting: Family Medicine

## 2016-04-28 ENCOUNTER — Telehealth: Payer: Self-pay | Admitting: Family Medicine

## 2016-04-28 NOTE — Telephone Encounter (Signed)
We should be able to get samples in in 2 days.

## 2016-04-28 NOTE — Telephone Encounter (Signed)
Pt states he was changed from the Invokamet to Xigduo XR due to being in the donut hole. He has used up the free month Rx for Merleen Nicely and questions what he should do until January. Questions if he should be on Metformin until then. Will route.

## 2016-04-28 NOTE — Telephone Encounter (Signed)
Samples have to be mailed and Pt only has 1 tablet left. Pt questions if he should get an Rx for Metfomin to tie him over in the meantime. Will route.   Local pharmacy: [facility] on [address].

## 2016-04-28 NOTE — Telephone Encounter (Signed)
Spoke with Xigduo Rep, she will come by office today for PCP signature to order samples.

## 2016-04-28 NOTE — Telephone Encounter (Signed)
Lets call and see if there is any way we can get him a couple weeks worth to get him through until January 1 of the Xigduo XR.

## 2016-04-28 NOTE — Telephone Encounter (Signed)
Pt advised,verbalized understanding. 

## 2016-05-06 NOTE — Telephone Encounter (Signed)
I called to see if the Rep knows when we should receive the Xigduo samples. I left a message for a return call.

## 2016-05-07 MED ORDER — METFORMIN HCL 1000 MG PO TABS: 1000.0000 mg | ORAL_TABLET | Freq: Two times a day (BID) | ORAL | 0 refills | Status: DC

## 2016-05-07 NOTE — Telephone Encounter (Signed)
Samples arrived. Patient advised. Samples left up front for pick up.

## 2016-05-07 NOTE — Addendum Note (Signed)
Addended by: Narda Rutherford on: 05/07/2016 01:04 PM   Modules accepted: Orders

## 2016-05-07 NOTE — Telephone Encounter (Signed)
Jon [name] called the Rep again this morning and left a message. There has been no return call. Can patient go back on Metformin until we can get him the Xigduo? His blood sugars have been around 200. Please advise.

## 2016-05-07 NOTE — Telephone Encounter (Signed)
Medication sent.  Patient aware  

## 2016-05-07 NOTE — Telephone Encounter (Signed)
Go back to 1000 mg twice a day

## 2016-05-12 ENCOUNTER — Other Ambulatory Visit: Payer: Self-pay | Admitting: Family Medicine

## 2016-05-20 ENCOUNTER — Telehealth: Payer: Self-pay | Admitting: *Deleted

## 2016-05-20 DIAGNOSIS — E1142 Type 2 diabetes mellitus with diabetic polyneuropathy: Secondary | ICD-10-CM

## 2016-05-20 NOTE — Telephone Encounter (Signed)
Pt lvm asking for podiatry referral. Would like to be seen in [place].Jon [name]

## 2016-06-03 ENCOUNTER — Ambulatory Visit (INDEPENDENT_AMBULATORY_CARE_PROVIDER_SITE_OTHER): Payer: Medicare Other | Admitting: Sports Medicine

## 2016-06-03 ENCOUNTER — Encounter: Payer: Self-pay | Admitting: Gastroenterology

## 2016-06-03 ENCOUNTER — Encounter: Payer: Self-pay | Admitting: Sports Medicine

## 2016-06-03 ENCOUNTER — Telehealth: Payer: Self-pay | Admitting: *Deleted

## 2016-06-03 DIAGNOSIS — E1142 Type 2 diabetes mellitus with diabetic polyneuropathy: Secondary | ICD-10-CM

## 2016-06-03 DIAGNOSIS — M79671 Pain in right foot: Secondary | ICD-10-CM

## 2016-06-03 DIAGNOSIS — M79672 Pain in left foot: Secondary | ICD-10-CM

## 2016-06-03 DIAGNOSIS — G629 Polyneuropathy, unspecified: Secondary | ICD-10-CM

## 2016-06-03 NOTE — Telephone Encounter (Addendum)
-----   Message from Landis Martins, [place] sent at 06/03/2016  8:55 AM EST ----- Regarding: Neurology consult: Diabetic neuropathy Consult to neurology patient has been on Gabapentin and had epidermal nerve fiber testing in 2012 with clinical worsening symptoms Thanks Dr Cannon Kettle. Faxed referral, clinicals and demographics to [facility].

## 2016-06-03 NOTE — Progress Notes (Signed)
Subjective: Jon [name] is a 71 y.o. male patient with history of diabetes who presents to office today complaining of worsening L>R burning and sharp pain that wakes him up at night. Patient denies any new changes in medication or new problems. Reports in 2012 had nerve biopsy with Dr. Geroge Baseman and was started on Gabapentin but could not tolerate it because of excessive nightmares. Reports that symptoms have been worsening over the last 10 years and was diagnosed with diabetes 15 years ago. Patient denies any other symptoms.  Patient Active Problem List   Diagnosis Date Noted  . CKD stage 3 due to type 2 diabetes mellitus ([place]) 03/22/2015  . Screening for prostate cancer 07/22/2011  . Diabetic peripheral neuropathy ([place]) 06/05/2011  . BENIGN POSITIONAL VERTIGO 05/20/2010  . ROTATOR CUFF SYNDROME, RIGHT 03/22/2010  . History of cardiovascular disorder 02/28/2010  . DM (diabetes mellitus) ([place]) 01/31/2010  . Hyperlipidemia 01/31/2010  . TOBACCO ABUSE 01/31/2010  . HYPERTENSION, MILD 01/31/2010   Current Outpatient Prescriptions on File Prior to Visit  Medication Sig Dispense Refill  . Blood Glucose Monitoring Suppl (ACCU-CHEK AVIVA PLUS) w/Device KIT Use daily 1 kit 0  . Canagliflozin-Metformin HCl [id number] MG TABS Take 1 tablet by mouth 2 (two) times daily. 60 tablet 6  . clopidogrel (PLAVIX) 75 MG tablet Take 1 tablet by mouth  daily 90 tablet 2  . Dapagliflozin-Metformin HCl ER (XIGDUO XR) 02-999 MG TB24 Take 1 tablet by mouth daily. 30 tablet 0  . fish oil-omega-3 fatty acids 1000 MG capsule Take 4 g by mouth daily.    [name] glipiZIDE (GLUCOTROL) 5 MG tablet TAKE 1 TABLET BY MOUTH TWO  TIMES DAILY WITH MEALS 180 tablet 2  . glucose blood (ACCU-CHEK AVIVA PLUS) test strip Test blood sugar twice daily Dx code: E11.9 Dx: DM type 2 100 each 11  . lisinopril (PRINIVIL,ZESTRIL) 2.5 MG tablet TAKE 1 TABLET BY MOUTH  DAILY 90 tablet 1  . metFORMIN (GLUCOPHAGE) 1000 MG tablet Take 1 tablet  (1,000 mg total) by mouth 2 (two) times daily with a meal. 60 tablet 0  . simvastatin (ZOCOR) 40 MG tablet Take 1 tablet by mouth at  bedtime 90 tablet 1   No current facility-administered medications on file prior to visit.    Allergies  Allergen Reactions  . Gabapentin Other (See Comments)    Nightmares.     Recent Results (from the past 2160 hour(s))  POCT glycosylated hemoglobin (Hb A1C)     Status: None   Collection Time: 03/26/16  8:56 AM  Result Value Ref Range   Hemoglobin A1C 8.0   POCT UA - Microalbumin     Status: None   Collection Time: 03/26/16  9:18 AM  Result Value Ref Range   Microalbumin Ur, POC 10 mg/L   Creatinine, POC 50 mg/dL   Albumin/Creatinine Ratio, Urine, POC <30     Comment: normal    Objective: General: Patient is awake, alert, and oriented x 3 and in no acute distress.  Integument: Skin is warm, dry and supple bilateral. Nails are short, thick, mildly discolored, consistent with onychomycosis, 1-5 bilateral. No signs of infection. No open lesions or preulcerative lesions present bilateral. Remaining integument unremarkable.  Vasculature:  Dorsalis Pedis pulse 1/4 bilateral. Posterior Tibial pulse  1/4 bilateral. Capillary fill time <3 sec 1-5 bilateral. Positive hair growth to the level of the digits.Temperature gradient within normal limits. + varicosities present bilateral. No edema present bilateral.   Neurology: The patient has INCRREASED sensation  measured with a 5.07/10g Semmes Weinstein Monofilament at all pedal sites bilateral . Vibratory sensation diminished bilateral with tuning fork. No Babinski sign present bilateral.   Musculoskeletal:  Asymptomatic hammertoe pedal deformities noted bilateral. Muscular strength 5/5 in all lower extremity muscular groups bilateral without pain on range of motion . No tenderness with calf compression bilateral. Pes cavus foot type and prominent metatarsals bilateral.  Assessment and Plan: Problem List Items  Addressed This Visit    None    Visit Diagnoses    Diabetic polyneuropathy associated with type 2 diabetes mellitus (HCC)    -  Primary   Foot pain, bilateral         -Examined patient. -Discussed and educated patient on diabetic foot care, especially with  regards to the vascular, neurological and musculoskeletal systems.  -Stressed the importance of good glycemic control and the detriment of not  controlling glucose levels in relation to the foot. -Neurology consult placed for worsening neuropathy with medication intolerance  -Answered all patient questions -Patient to return after neurology consult -Patient advised to call the office if any problems or questions arise in the meantime.  Landis Martins, DPM

## 2016-06-03 NOTE — Patient Instructions (Signed)

## 2016-06-09 ENCOUNTER — Telehealth: Payer: Self-pay

## 2016-06-09 NOTE — Telephone Encounter (Signed)
Kai called and is wanting a prescription for DM and a prescription to help quit smoking. A copy of the formulary for Medicare [employer] is in your basket.

## 2016-06-10 MED ORDER — EMPAGLIFLOZIN-METFORMIN HCL 12.5-1000 MG PO TABS: 1.0000 | ORAL_TABLET | Freq: Two times a day (BID) | ORAL | 3 refills | Status: DC

## 2016-06-10 NOTE — Telephone Encounter (Signed)
Call patient: I reviewed the formulary. They will pay for Synjardy which is similar to the Invokamet that that he was on previously. And they will pay for the glipizide. So she he should just be taking the glipizide and the Synjardy. The Synjardy will Artie have the metformin in it.  Beatrice Lecher, MD

## 2016-06-11 MED ORDER — EMPAGLIFLOZIN-METFORMIN HCL 12.5-1000 MG PO TABS: 1.0000 | ORAL_TABLET | Freq: Two times a day (BID) | ORAL | 1 refills | Status: DC

## 2016-06-11 NOTE — Telephone Encounter (Signed)
synjardy was $300. Total cost is over $1000. (The medication is a tier 3 med.) Patient cannot afford med

## 2016-06-11 NOTE — Telephone Encounter (Signed)
Patient advised.

## 2016-06-11 NOTE — Telephone Encounter (Signed)
Then he will have to go on insulin.

## 2016-06-12 NOTE — Telephone Encounter (Signed)
Pt informed. He stated that he will NOT do insulin.will discuss medication options at next appt.Audelia Hives [place]

## 2016-06-15 ENCOUNTER — Other Ambulatory Visit: Payer: Self-pay | Admitting: Family Medicine

## 2016-06-16 NOTE — Addendum Note (Signed)
Addended by: Narda Rutherford on: 06/16/2016 04:24 PM   Modules accepted: Orders

## 2016-06-17 ENCOUNTER — Ambulatory Visit (INDEPENDENT_AMBULATORY_CARE_PROVIDER_SITE_OTHER): Payer: Medicare Other | Admitting: Family Medicine

## 2016-06-17 ENCOUNTER — Other Ambulatory Visit: Payer: Self-pay | Admitting: Family Medicine

## 2016-06-17 VITALS — BP 115/61 | HR 86 | Wt 221.0 lb

## 2016-06-17 DIAGNOSIS — E118 Type 2 diabetes mellitus with unspecified complications: Secondary | ICD-10-CM | POA: Diagnosis not present

## 2016-06-17 MED ORDER — INSULIN DETEMIR 100 UNIT/ML FLEXPEN: 10.0000 [IU] | PEN_INJECTOR | Freq: Every day | SUBCUTANEOUS | 5 refills | Status: DC

## 2016-06-17 NOTE — Progress Notes (Signed)
Agree with below. It sounds like he has a deductible.  Beatrice Lecher, MD

## 2016-06-17 NOTE — Progress Notes (Signed)
   Subjective:    Patient ID: Jon [name], male    DOB: 1945-06-30, 71 y.o.   MRN: [id number]  Patient here for instructions on insulin pen. Due to cost of last medication he has been switched to insulin.    Diabetes       Review of Systems  Constitutional: Negative.   HENT: Negative.   Respiratory: Negative.   Cardiovascular: Negative.        Objective:   Physical Exam        Assessment & Plan:  Diabetes, patient instructed on how to use insulin pin. He verbalized understanding. The cost of the Levamir is $300 for 75 day supply. I advise patient to call insurance company to find out if he has a deductible oh his prescription insurance. This could be the cause of the high cost. Otherwise patient wants to switch back to Metformin.

## 2016-06-18 ENCOUNTER — Other Ambulatory Visit: Payer: Self-pay

## 2016-06-18 MED ORDER — INSULIN DETEMIR 100 UNIT/ML FLEXPEN: 10.0000 [IU] | PEN_INJECTOR | Freq: Every day | SUBCUTANEOUS | 2 refills | Status: DC

## 2016-06-24 ENCOUNTER — Ambulatory Visit: Payer: Medicare Other | Admitting: Family Medicine

## 2016-06-30 ENCOUNTER — Encounter: Payer: Self-pay | Admitting: Family Medicine

## 2016-06-30 ENCOUNTER — Ambulatory Visit (INDEPENDENT_AMBULATORY_CARE_PROVIDER_SITE_OTHER): Payer: Medicare Other | Admitting: Family Medicine

## 2016-06-30 VITALS — BP 126/70 | HR 70 | Ht 73.0 in | Wt 223.0 lb

## 2016-06-30 DIAGNOSIS — I10 Essential (primary) hypertension: Secondary | ICD-10-CM | POA: Diagnosis not present

## 2016-06-30 DIAGNOSIS — E119 Type 2 diabetes mellitus without complications: Secondary | ICD-10-CM | POA: Diagnosis not present

## 2016-06-30 LAB — POCT GLYCOSYLATED HEMOGLOBIN (HGB A1C): HEMOGLOBIN A1C: 8.9

## 2016-06-30 MED ORDER — METFORMIN HCL ER 500 MG PO TB24: 500.0000 mg | ORAL_TABLET | Freq: Every day | ORAL | 1 refills | Status: DC

## 2016-06-30 NOTE — Patient Instructions (Addendum)
Increase  Toujeo to 24 units for 3 days.  Ok to increase to 26 units if still need to go up. Call me with your sugars in one week.   Check sugar in the morning fasting and then 2 hours after your lunch meal.

## 2016-06-30 NOTE — Progress Notes (Signed)
Subjective:    CC: DM, HTN  HPI:  Diabetes - no hypoglycemic events. No wounds or sores that are not healing well. No increased thirst or urination. Checking glucose at home. Says his sugars are all over the "place".   Taking medications as prescribed without any side effects.He is up to 20 units. He currently has Toujeo because we have given him 2 sample pens that his prescription is for Levemir. He just frustrated that his sugars are running anywhere from about the 150s up to 400. His insurance is too expensive for the newer drugs.  Lab Results  Component Value Date   HGBA1C 8.0 03/26/2016     Hypertension- Pt denies chest pain, SOB, dizziness, or heart palpitations.  Taking meds as directed w/o problems.  Denies medication side effects.     Past medical history, Surgical history, Family history not pertinant except as noted below, Social history, Allergies, and medications have been entered into the medical record, reviewed, and corrections made.   Review of Systems: No fevers, chills, night sweats, weight loss, chest pain, or shortness of breath.   Objective:    General: Well Developed, well nourished, and in no acute distress.  Neuro: Alert and oriented x3, extra-ocular muscles intact, sensation grossly intact.  HEENT: Normocephalic, atraumatic  Skin: Warm and dry, no rashes. Cardiac: Regular rate and rhythm, no murmurs rubs or gallops, no lower extremity edema.  Respiratory: Clear to auscultation bilaterally. Not using accessory muscles, speaking in full sentences.   Impression and Recommendations:    DM- Uncontrolled. A1C up to 8.9. Increase Toujeo to 24 units. After 3 days can go up to 26 units if needed. I encouraged him check his blood sugar first thing in the morning fasting and then 2 hours after his lunch meal of the day to just get an idea where he's running in the middle of the day. As explained to him that when her sugar hits 400 that completely dietary. That's not a  lack of the medication working. Since he was unable to get the [place] I'm going to go ahead and put him back on 500 XR of metformin. He tends to get diarrhea on higher strength. Continue the glipizide. And continue the Toujeo.  HTN - Well controlled. Continue current regimen. Follow up in  3-4 months.

## 2016-07-09 ENCOUNTER — Telehealth: Payer: Self-pay

## 2016-07-09 NOTE — Telephone Encounter (Signed)
Jon [name] states the Levemir may be $280 for a 90 day supply. He is waiting for a call back from [facility]. He wanted to know if there is something cheaper if needed. I'm not sure what it would cost with his insurance for [employer]. I tried to call [facility] but was left on hold for 15 + minutes. Please advise.

## 2016-07-10 NOTE — Telephone Encounter (Signed)
OK. Can send refill if needed for Levemir.

## 2016-07-10 NOTE — Telephone Encounter (Signed)
[name] called back and states the problem was that he has to pay out a deductible first. After the first payment the cost will drop to $160 a month. He will stay with the Levemir.

## 2016-07-14 ENCOUNTER — Other Ambulatory Visit: Payer: Self-pay | Admitting: Family Medicine

## 2016-07-14 ENCOUNTER — Ambulatory Visit: Payer: Medicare Other | Admitting: Family Medicine

## 2016-07-18 ENCOUNTER — Other Ambulatory Visit: Payer: Self-pay | Admitting: Family Medicine

## 2016-07-18 DIAGNOSIS — N183 Chronic kidney disease, stage 3 (moderate): Principal | ICD-10-CM

## 2016-07-18 DIAGNOSIS — E1122 Type 2 diabetes mellitus with diabetic chronic kidney disease: Secondary | ICD-10-CM

## 2016-07-23 ENCOUNTER — Telehealth: Payer: Self-pay

## 2016-07-23 ENCOUNTER — Other Ambulatory Visit: Payer: Self-pay | Admitting: *Deleted

## 2016-07-23 DIAGNOSIS — E119 Type 2 diabetes mellitus without complications: Secondary | ICD-10-CM

## 2016-07-23 MED ORDER — INSULIN PEN NEEDLE 31G X 8 MM MISC: 99 refills | Status: DC

## 2016-07-23 MED ORDER — PEN NEEDLES 32G X 4 MM MISC: 1.0000 | Freq: Every day | 6 refills | Status: DC

## 2016-07-23 NOTE — Telephone Encounter (Signed)
Sent ultra-fine pen needles to [facility].

## 2016-07-23 NOTE — Progress Notes (Signed)
Pen n

## 2016-07-29 ENCOUNTER — Telehealth: Payer: Self-pay

## 2016-07-29 NOTE — Telephone Encounter (Signed)
[facility] called for clarification on pen needles.  Spoke with Val Riles, CMA and she clarified that 32g 44mm was ordered.  Pharmacy notified.

## 2016-07-30 ENCOUNTER — Encounter: Payer: Self-pay | Admitting: Neurology

## 2016-07-30 ENCOUNTER — Ambulatory Visit (INDEPENDENT_AMBULATORY_CARE_PROVIDER_SITE_OTHER): Payer: Medicare Other | Admitting: Neurology

## 2016-07-30 VITALS — BP 112/82 | HR 77 | Ht 73.0 in | Wt 227.7 lb

## 2016-07-30 DIAGNOSIS — E1142 Type 2 diabetes mellitus with diabetic polyneuropathy: Secondary | ICD-10-CM | POA: Diagnosis not present

## 2016-07-30 DIAGNOSIS — I69341 Monoplegia of lower limb following cerebral infarction affecting right dominant side: Secondary | ICD-10-CM | POA: Insufficient documentation

## 2016-07-30 MED ORDER — LIDOCAINE 5 % EX OINT: 1.0000 "application " | TOPICAL_OINTMENT | CUTANEOUS | 0 refills | Status: DC | PRN

## 2016-07-30 NOTE — Patient Instructions (Addendum)
Start lidocaine ointment to your left foot, if it is too expense you can try over-the-counter lidocaine ointment (such as Aspercream or Salonpas)

## 2016-07-30 NOTE — Progress Notes (Signed)
[facility] Clinic Note - Initial Visit   Date: 07/30/16  Jon [name] MRN: [id number] DOB: 12/12/1945   Dear Dr. Stover:  Thank you for your kind referral of Jon [name] for consultation of diabetic neuropathy. Although his history is well known to you, please allow us to reiterate it for the purpose of our medical record. The patient was accompanied to the clinic by self.    History of Present Illness: Jon [name] is a 71 y.o. right-handed Caucasian male with diabetes mellitus, hypertension, hyperlipidemia, tobacco use, and stroke (left putamen 2011 with residual left leg weakness) presenting for evaluation of neuropathy.    Starting around 2008, he began having numbness of the feet.  Symptoms have been slowly worsening over the years such as now he has episodic and sporadic stabbing pain in the feet and left lateral foot burning and soreness.  His stabbing pain can occur once every 1-2 months and last only a few seconds, but can be severe.  There are no exacerbating or alleviating factors.  He has tried gabapentin in the past, but stopped this due to nightmares.  He endorses imbalance and leg weakness from his old stroke.  He was diabetic 5 years prior to onset of neuropathy.  He had a stroke in 2011 manifesting with right leg and foot weakness. MRI brain showed left posterior putamen, centrum semiovale.  HbA1c 9.1,  Echo did not show PFO.   He has residual weakness of the right leg.  He did not go for physical therapy which was recommended.  He was startedon plavix 75mg which he continues.  He has been a long time smoker and does not wish to quit.    Out-side paper records, electronic medical record, and images have been reviewed where available and summarized as:  Skin biopsy 05/31/2010:  Severely decreased intra-epidermal fiber density.  MRI brain 12/08/2009: 1. Small acute infarct in the deep white matter of the left posterior putamen,  centrum semiovale/periventricular white matter, which corresponds to hypoattenuation on yesterday's CT head. 2. Scattered T2 signal abnormality consistent with gliosis secondary to small vessel ischemia in the periventricular white matter..  Past Medical History:  Diagnosis Date  . Diabetes mellitus   . Epididymitis   . Hyperlipemia   . Hypertension   . Stroke (HCC) 7/11    Past Surgical History:  Procedure Laterality Date  . PILONIDAL CYST EXCISION  1960, 61, 62, 63  . VASECTOMY       Medications:  Outpatient Encounter Prescriptions as of 07/30/2016  Medication Sig  . Blood Glucose Monitoring Suppl (ACCU-CHEK AVIVA PLUS) w/Device KIT Check fasting blood sugar daily and before meals. DM Diagnosis  . clopidogrel (PLAVIX) 75 MG tablet TAKE 1 TABLET BY MOUTH  DAILY  . fish oil-omega-3 fatty acids 1000 MG capsule Take 4 g by mouth daily.  . glipiZIDE (GLUCOTROL) 5 MG tablet TAKE 1 TABLET BY MOUTH TWO  TIMES DAILY WITH MEALS  . glucose blood (ACCU-CHEK AVIVA PLUS) test strip Test blood sugar twice daily Dx code: E11.9 Dx: DM type 2  . Insulin Detemir (LEVEMIR FLEXTOUCH) 100 UNIT/ML Pen Inject 10-20 Units into the skin daily at 10 pm.  . Insulin Pen Needle (PEN NEEDLES) 32G X 4 MM MISC Inject 1 each into the skin daily. DX:E11.9  . lisinopril (PRINIVIL,ZESTRIL) 2.5 MG tablet TAKE 1 TABLET BY MOUTH  DAILY  . metFORMIN (GLUCOPHAGE XR) 500 MG 24 hr tablet Take 1 tablet (500 mg total) by mouth daily   with breakfast.  . simvastatin (ZOCOR) 40 MG tablet TAKE 1 TABLET BY MOUTH AT  BEDTIME   No facility-administered encounter medications on file as of 07/30/2016.      Allergies:  Allergies  Allergen Reactions  . Gabapentin Other (See Comments)    Nightmares.     Family History: Family History  Problem Relation Age of Onset  . Diabetes Other     family hx of  . Colon cancer Neg Hx     Social History: Social History  Substance Use Topics  . Smoking status: Current Every Day Smoker     Types: Cigarettes  . Smokeless tobacco: Never Used     Comment: Counseling sheet given in exam room 07-2011   . Alcohol use No   Social History   Social History Narrative   Daily caffeine     Review of Systems:  CONSTITUTIONAL: No fevers, chills, night sweats, or weight loss.   EYES: No visual changes or eye pain ENT: No hearing changes.  No history of nose bleeds.   RESPIRATORY: No cough, wheezing and shortness of breath.   CARDIOVASCULAR: Negative for chest pain, and palpitations.   GI: Negative for abdominal discomfort, blood in stools or black stools.  No recent change in bowel habits.   GU:  No history of incontinence.   MUSCLOSKELETAL: +history of joint pain or swelling.  No myalgias.   SKIN: Negative for lesions, rash, and itching.   HEMATOLOGY/ONCOLOGY: Negative for prolonged bleeding, bruising easily, and swollen nodes.  No history of cancer.   ENDOCRINE: Negative for cold or heat intolerance, polydipsia or goiter.   PSYCH:  No depression or anxiety symptoms.   NEURO: As Above.   Vital Signs:  BP 112/82 (BP Location: Left Arm, Patient Position: Sitting, Cuff Size: Large)   Pulse 77   Ht 6' 1" (1.854 m)   Wt 227 lb 11.2 oz (103.3 kg)   SpO2 98%   BMI 30.04 kg/m    General Medical Exam:   General:  Well appearing, comfortable.   Eyes/ENT: see cranial nerve examination.   Neck: No masses appreciated.  Full range of motion without tenderness.  No carotid bruits. Respiratory:  Clear to auscultation, good air entry bilaterally.   Cardiac:  Regular rate and rhythm, no murmur.   Extremities:  No deformities, edema, or skin discoloration.  Skin:  No rashes or lesions.  Neurological Exam: MENTAL STATUS including orientation to time, place, person, recent and remote memory, attention span and concentration, language, and fund of knowledge is normal.  Speech is not dysarthric.  CRANIAL NERVES: II:  No visual field defects.  Unremarkable fundi.   III-IV-VI: Pupils  equal round and reactive to light.  Normal conjugate, extra-ocular eye movements in all directions of gaze.  No nystagmus.  No ptosis.   V:  Normal facial sensation.     VII:  Normal facial symmetry and movements.  VIII:  Normal hearing and vestibular function.   IX-X:  Normal palatal movement.   XI:  Normal shoulder shrug and head rotation.   XII:  Normal tongue strength and range of motion, no deviation or fasciculation.  MOTOR:  No atrophy, fasciculations or abnormal movements.  No pronator drift.  Tone is normal.    Right Upper Extremity:    Left Upper Extremity:    Deltoid  5/5   Deltoid  5/5   Biceps  5/5   Biceps  5/5   Triceps  5/5   Triceps  5/5     Wrist extensors  5/5   Wrist extensors  5/5   Wrist flexors  5/5   Wrist flexors  5/5   Finger extensors  5/5   Finger extensors  5/5   Finger flexors  5/5   Finger flexors  5/5   Dorsal interossei  5/5   Dorsal interossei  5/5   Abductor pollicis  5/5   Abductor pollicis  5/5   Tone (Ashworth scale)  0  Tone (Ashworth scale)  0   Right Lower Extremity:    Left Lower Extremity:    Hip flexors  4+/5   Hip flexors  5/5   Hip extensors  5/5   Hip extensors  5/5   Knee flexors  5/5   Knee flexors  5/5   Knee extensors  5/5   Knee extensors  5/5   Dorsiflexors  5/5   Dorsiflexors  5/5   Plantarflexors  5/5   Plantarflexors  5/5   Toe extensors  5/5   Toe extensors  5/5   Toe flexors  5/5   Toe flexors  5/5   Tone (Ashworth scale)  0  Tone (Ashworth scale)  0   MSRs:  Right                                                                 Left brachioradialis 2+  brachioradialis 2+  biceps 2+  biceps 2+  triceps 2+  triceps 2+  patellar 3+  Patellar 2+  ankle jerk 0  ankle jerk 0  Hoffman no  Hoffman no  plantar response down  plantar response down   SENSORY:  Pin prick, temperature, and light touch reduced over the dorsum of the feet and worse distally.  Vibration is absent at the great toe bilaterally. Romberg's sign is  positive.   COORDINATION/GAIT: Normal finger-to- nose-finger.  Intact rapid alternating movements bilaterally. Gait narrow based and stable.  He is unable to perform tandem gait.  Stressed gait intact.   IMPRESSION: 1.  Diabetic polyneuropathy affecting the feet.  His neurological examination shows a distal predominant small and large fiber peripheral neuropathy. I had extensive discussion with the patient regarding the pathogenesis, etiology, management, and natural course of neuropathy. Neuropathy tends to be slowly progressive, especially if the underlying etiology, in this case, diabetes, is not well-managed.  He admits that he could be better about managing his diabetes, which I stressed.  Fortunately at this time, he has mostly numbness involving the feet which cannot be improved by pain medication.  For his sporadic shooting pain over the lateral left foot, he can try lidocaine ointment.  If his painful paresthesias become more frequent, we can start him on a daily oral medication.  He had many questions which I answered to the best of my ability.   2.  Right leg weakness due to old left putamen stroke (2011).  He is on plavix 32m, statin therapy, and BP management which is well-controlled.  I advised him to stop smoking, but he does not seem interested at this time.  Return to clinic as needed  The duration of this appointment visit was 60 minutes of face-to-face time with the patient.  Greater than 50% of this time was spent in counseling, explanation of diagnosis, planning of  further management, and coordination of care.   Thank you for allowing me to participate in patient's care.  If I can answer any additional questions, I would be pleased to do so.    Sincerely,    Juno Bozard K. Posey Pronto, DO

## 2016-09-05 ENCOUNTER — Other Ambulatory Visit: Payer: Self-pay | Admitting: Family Medicine

## 2016-09-10 ENCOUNTER — Other Ambulatory Visit: Payer: Self-pay | Admitting: Family Medicine

## 2016-10-16 ENCOUNTER — Encounter: Payer: Self-pay | Admitting: Family Medicine

## 2016-10-16 ENCOUNTER — Ambulatory Visit (INDEPENDENT_AMBULATORY_CARE_PROVIDER_SITE_OTHER): Payer: Medicare Other | Admitting: Family Medicine

## 2016-10-16 VITALS — BP 125/76 | HR 76 | Ht 73.0 in | Wt 226.0 lb

## 2016-10-16 DIAGNOSIS — E119 Type 2 diabetes mellitus without complications: Secondary | ICD-10-CM

## 2016-10-16 DIAGNOSIS — R21 Rash and other nonspecific skin eruption: Secondary | ICD-10-CM

## 2016-10-16 DIAGNOSIS — K130 Diseases of lips: Secondary | ICD-10-CM | POA: Diagnosis not present

## 2016-10-16 DIAGNOSIS — L821 Other seborrheic keratosis: Secondary | ICD-10-CM

## 2016-10-16 DIAGNOSIS — I10 Essential (primary) hypertension: Secondary | ICD-10-CM

## 2016-10-16 LAB — POCT GLYCOSYLATED HEMOGLOBIN (HGB A1C): HEMOGLOBIN A1C: 8.3

## 2016-10-16 MED ORDER — INSULIN DETEMIR 100 UNIT/ML FLEXPEN: 35.0000 [IU] | PEN_INJECTOR | Freq: Every day | SUBCUTANEOUS | 3 refills | Status: DC

## 2016-10-16 MED ORDER — NYSTATIN 100000 UNIT/GM EX CREA: 1.0000 "application " | TOPICAL_CREAM | Freq: Two times a day (BID) | CUTANEOUS | 1 refills | Status: DC

## 2016-10-16 NOTE — Progress Notes (Signed)
Subjective:    CC: DM  HPI: Diabetes - no hypoglycemic events. No wounds or sores that are not healing well. No increased thirst or urination. Checking glucose at home. Taking medications as prescribed without any side effects.He admits he is still really struggling with his diet. He says when he does a good job and watching what he eats and cut out sweets his blood sugars are usually under 130 fasting and when he doesn't they typically run anywhere between 150s to 180s.  RAsh - She comes in today complaining of a rash under both axilla and in the groin area. He says sometimes it actually feels moist to touch and it's burns at times. He's concerned that it could be a medication reaction. He also thought it could be as deodorant so he quit wearing deodorant about 2 weeks ago as well just to see if that would help but it did not.He also has some cracking on the sides of his mouth with a little bit of a rash there as well.  Hypertension- Pt denies chest pain, SOB, dizziness, or heart palpitations.  Taking meds as directed w/o problems.  Denies medication side effects.    He also has a couple lesions on his scalp he wants me to look at today. We have frozen some actinic keratoses previously and he just wanted to make sure they were not a case.  BP 125/76   Pulse 76   Ht '6\' 1"'$  (1.854 m)   Wt 226 lb (102.5 kg)   BMI 29.82 kg/m     Allergies  Allergen Reactions  . Gabapentin Other (See Comments)    Nightmares.     Past Medical History:  Diagnosis Date  . Diabetes mellitus   . Epididymitis   . Hyperlipemia   . Hypertension   . Stroke [facility]) 7/11    Past Surgical History:  Procedure Laterality Date  . PILONIDAL CYST EXCISION  1960, 61, 62, 63  . VASECTOMY      Social History   Social History  . Marital status: Divorced    Spouse name: N/A  . Number of children: 2  . Years of education: N/A   Occupational History  . Retired    Social History Main Topics  . Smoking status:  Current Every Day Smoker    Types: Cigarettes  . Smokeless tobacco: Never Used     Comment: Counseling sheet given in exam room 07-2011   . Alcohol use No  . Drug use: No  . Sexual activity: Not on file   Other Topics Concern  . Not on file   Social History Narrative   Daily caffeine     Family History  Problem Relation Age of Onset  . Diabetes Other        family hx of  . Colon cancer Neg Hx     Outpatient Encounter Prescriptions as of 10/16/2016  Medication Sig  . ACCU-CHEK AVIVA PLUS test strip USE ONE STRIP TO CHECK  BLOOD SUGAR TWICE DAILY AS  DIRECTED  . Blood Glucose Monitoring Suppl (ACCU-CHEK AVIVA PLUS) w/Device KIT Check fasting blood sugar daily and before meals. DM Diagnosis  . clopidogrel (PLAVIX) 75 MG tablet TAKE 1 TABLET BY MOUTH  DAILY  . fish oil-omega-3 fatty acids 1000 MG capsule Take 4 g by mouth daily.  [name] glipiZIDE (GLUCOTROL) 5 MG tablet TAKE 1 TABLET BY MOUTH TWO  TIMES DAILY WITH MEALS  . glucose blood (ACCU-CHEK AVIVA PLUS) test strip Test blood sugar twice  daily Dx code: E11.9 Dx: DM type 2  . Insulin Pen Needle (PEN NEEDLES) 32G X 4 MM MISC Inject 1 each into the skin daily. DX:E11.9  . LEVEMIR FLEXTOUCH 100 UNIT/ML Pen INJECT 10-20 UNITS INTO THE SKIN DAILY AT 10 PM.  . lidocaine (XYLOCAINE) 5 % ointment Apply 1 application topically as needed. Apply to foot twice daily as needed.  [name] lisinopril (PRINIVIL,ZESTRIL) 2.5 MG tablet TAKE 1 TABLET BY MOUTH  DAILY  . metFORMIN (GLUCOPHAGE XR) 500 MG 24 hr tablet Take 1 tablet (500 mg total) by mouth daily with breakfast.  . simvastatin (ZOCOR) 40 MG tablet TAKE 1 TABLET BY MOUTH AT  BEDTIME  . nystatin cream (MYCOSTATIN) Apply 1 application topically 2 (two) times daily.   No facility-administered encounter medications on file as of 10/16/2016.         Objective:    General: Well Developed, well nourished, and in no acute distress.  Neuro: Alert and oriented x3, extra-ocular muscles intact, sensation  grossly intact.  HEENT: Normocephalic, atraumatic  Skin: Warm and dry, no rashes.On his scalp he has a couple of Cockerill papular seborrheic keratoses. Under the axillae he has a small area extending right along the under arm crease of erythema. No break in the skin or bleeding. Similar lesions in the groin creases .  He also has some erythema cracking and scaling at the corners of his lips bilaterally. Cardiac: Regular rate and rhythm, no murmurs rubs or gallops, no lower extremity edema.  Respiratory: Clear to auscultation bilaterally. Not using accessory muscles, speaking in full sentences.   Impression and Recommendations:   DM- Uncontrolled. 11 A1c of 8.3 today but that is an improvement from 8.9 previously. He is now up to 35 units on his Lantus. He has been on that dose for about 2 days. Continue with that current regimen and continue to watch diet. He can certainly continue to increase the insulin a couple of units every few days if needed. We'll need to send a new prescription to update this and he doesn't run out of his insulin. Follow-up in 3 months.  Rash - tinea cruris versus erythrasma versus yeast infection. Will treat with topical nystatin. He is to call me back after 2 weeks if the does not have resolution of his symptoms.  HTN - Well controlled. Continue current regimen. Follow up in  6 months.   Seborrheic keratoses.- They've reassurance that these are benign. They do not require treatment but certainly we could do some cryotherapy if he prefers.  Angular cheilitis = treat with topical nystatin.

## 2016-10-21 ENCOUNTER — Other Ambulatory Visit: Payer: Self-pay | Admitting: Family Medicine

## 2016-11-26 ENCOUNTER — Other Ambulatory Visit: Payer: Self-pay | Admitting: Family Medicine

## 2016-11-27 ENCOUNTER — Telehealth: Payer: Self-pay

## 2016-11-27 NOTE — Telephone Encounter (Signed)
Pt states that the cream you recently gave him for rash got rid of all areas but 3 days after he stopped treatment rashes are back. Please advise.

## 2016-11-28 MED ORDER — FLUCONAZOLE 150 MG PO TABS: 150.0000 mg | ORAL_TABLET | ORAL | 0 refills | Status: DC

## 2016-11-28 MED ORDER — NYSTATIN 100000 UNIT/GM EX CREA: 1.0000 "application " | TOPICAL_CREAM | Freq: Two times a day (BID) | CUTANEOUS | 1 refills | Status: DC

## 2016-11-28 NOTE — Telephone Encounter (Signed)
Pt states he did use medication consistently for 2 weeks and it still came back. Please advise.

## 2016-11-28 NOTE — Telephone Encounter (Signed)
I apologize. I think that was a miscommunication on my part. He needed to use the cream for a full 2 weeks. That helps get rid of any extra spores on the skin in because if we do not completely clear that up then it will come back pretty rapidly. So use consistently for 2 full weeks before stopping.

## 2016-11-28 NOTE — Telephone Encounter (Signed)
Pt.notified

## 2016-11-28 NOTE — Telephone Encounter (Signed)
Okay, will send of her oral Diflucan. I'll take 1 tablet every other day for a total of 3 tabs. He is to skip his simvastatin while taking this medication. Then he can restart the simvastatin safely. I want him to restart the cream as well and use for 3 weeks on top of the oral medication to see if we can get this cleared up more long-term.

## 2017-01-18 ENCOUNTER — Other Ambulatory Visit: Payer: Self-pay | Admitting: Family Medicine

## 2017-01-26 NOTE — Progress Notes (Signed)
Subjective:    CC: DM, Rash   HPI:  Hypertension- Pt denies chest pain, SOB, dizziness, or heart palpitations.  Taking meds as directed w/o problems.  Denies medication side effects.    Diabetes - no hypoglycemic events. No wounds or sores that are not healing well. No increased thirst or urination. Checking glucose at home. Taking medications as prescribed without any side effects. Metformin caused diarrhea after he restarted it.   Groin Rash - I saw him for a groin rash about 3 months ago.  We tried treatment with nystatin.  He did get improvement but the rash came back immediately.  I then sent over diflucan, oral in July.  He is doing better.   ED - He heard an add on the radio for generic ildenafil. He said he tried Viagra several yers ago and it worked well. He is interested in retrying it and wanow if it would be safe wis current meons.  Past medical history, Surgical history, Family history not pertinant except as noted below, Social history, Allergies, and medications have been entered into the medical record, reviewed, and corrections made.   Review of Systems: No fevers, chills, night sweats, weight loss, chest pain, or shortness of breath.   Objective:    General: Well Developed, well nourished, and in no acute distress.  Neuro: Alert and oriented x3, extra-ocular muscles intact, sensation grossly intact.  HEENT: Normocephalic, atraumatic  Skin: Warm and dry, no rashes. Cardiac: Regular rate and rhythm, no murmurs rubs or gallops, no lower extremity edema.  Respiratory: Clear to auscultation bilaterally. Not using accessory muscles, speaking in full sentences.   Impression and Recommendations:    HTN - Well controlled. Continue current regimen. Follow up in  4 months.    DM - Uncontrolled. Discussed options. Will start by increasingLevemir to 50 units. He will also work on trying to cut back on sugary foods such as cakes and candies. He will can 3 days and let mew what his  bdoing and we can adjust the insulin again from there. He would also be a good candidate for something like Trulicity if his insurance will cover it.. Follow up in  4 months.   Rash, groin -  Improved.    CKD 3 - due to recheck renal function  ED - will try the generic sildenafil.  Warned about S.E. Of drug.

## 2017-01-27 ENCOUNTER — Encounter: Payer: Self-pay | Admitting: Family Medicine

## 2017-01-27 ENCOUNTER — Ambulatory Visit (INDEPENDENT_AMBULATORY_CARE_PROVIDER_SITE_OTHER): Payer: Medicare Other | Admitting: Family Medicine

## 2017-01-27 VITALS — BP 113/68 | HR 72 | Ht 73.0 in | Wt 223.0 lb

## 2017-01-27 DIAGNOSIS — N183 Chronic kidney disease, stage 3 unspecified: Secondary | ICD-10-CM

## 2017-01-27 DIAGNOSIS — E1122 Type 2 diabetes mellitus with diabetic chronic kidney disease: Secondary | ICD-10-CM

## 2017-01-27 DIAGNOSIS — I10 Essential (primary) hypertension: Secondary | ICD-10-CM | POA: Diagnosis not present

## 2017-01-27 DIAGNOSIS — N529 Male erectile dysfunction, unspecified: Secondary | ICD-10-CM | POA: Diagnosis not present

## 2017-01-27 DIAGNOSIS — E119 Type 2 diabetes mellitus without complications: Secondary | ICD-10-CM | POA: Diagnosis not present

## 2017-01-27 DIAGNOSIS — R21 Rash and other nonspecific skin eruption: Secondary | ICD-10-CM | POA: Diagnosis not present

## 2017-01-27 DIAGNOSIS — Z23 Encounter for immunization: Secondary | ICD-10-CM

## 2017-01-27 LAB — POCT UA - MICROALBUMIN
Albumin/Creatinine Ratio, Urine, POC: <30
Creatinine, POC: 200 mg/dL
MICROALBUMIN (UR) POC: 30 mg/L

## 2017-01-27 LAB — POCT GLYCOSYLATED HEMOGLOBIN (HGB A1C): HEMOGLOBIN A1C: 9.7

## 2017-01-27 MED ORDER — SILDENAFIL CITRATE 20 MG PO TABS: 40.0000 mg | ORAL_TABLET | Freq: Three times a day (TID) | ORAL | 3 refills | Status: DC

## 2017-01-27 NOTE — Patient Instructions (Signed)
Increase to 50 units and check for 3 days. Call back with those numbers and let me know.

## 2017-01-30 DIAGNOSIS — I10 Essential (primary) hypertension: Secondary | ICD-10-CM | POA: Diagnosis not present

## 2017-01-30 DIAGNOSIS — E119 Type 2 diabetes mellitus without complications: Secondary | ICD-10-CM | POA: Diagnosis not present

## 2017-01-30 LAB — COMPLETE METABOLIC PANEL WITH GFR
AG Ratio: 1.5 (calc) (ref 1.0–2.5)
ALBUMIN MSPROF: 4 g/dL (ref 3.6–5.1)
ALKALINE PHOSPHATASE (APISO): 75 U/L (ref 40–115)
ALT: 19 U/L (ref 9–46)
AST: 14 U/L (ref 10–35)
BUN / CREAT RATIO: 8 (calc) (ref 6–22)
BUN: 11 mg/dL (ref 7–25)
CALCIUM: 8.8 mg/dL (ref 8.6–10.3)
CO2: 26 mmol/L (ref 20–32)
CREATININE: 1.31 mg/dL — AB (ref 0.70–1.18)
Chloride: 108 mmol/L (ref 98–110)
GFR, EST NON AFRICAN AMERICAN: 55 mL/min/{1.73_m2} — AB (ref >=60)
GFR, Est African American: 63 mL/min/{1.73_m2} (ref >=60)
GLOBULIN: 2.6 g/dL (ref 1.9–3.7)
GLUCOSE: 161 mg/dL — AB (ref 65–99)
Potassium: 4.7 mmol/L (ref 3.5–5.3)
Sodium: 140 mmol/L (ref 135–146)
TOTAL PROTEIN: 6.6 g/dL (ref 6.1–8.1)
Total Bilirubin: 0.5 mg/dL (ref 0.2–1.2)

## 2017-01-30 LAB — LIPID PANEL W/REFLEX DIRECT LDL
CHOL/HDL RATIO: 4.2 (calc) (ref <5.0)
CHOLESTEROL: 154 mg/dL (ref <200)
HDL: 37 mg/dL — ABNORMAL LOW (ref >40)
LDL CHOLESTEROL (CALC): 92 mg/dL
NON-HDL CHOLESTEROL (CALC): 117 mg/dL (ref <130)
Triglycerides: 150 mg/dL — ABNORMAL HIGH (ref <150)

## 2017-02-01 NOTE — Progress Notes (Signed)
All labs are normal. 

## 2017-02-03 ENCOUNTER — Other Ambulatory Visit: Payer: Self-pay | Admitting: Family Medicine

## 2017-02-24 ENCOUNTER — Telehealth: Payer: Self-pay | Admitting: Family Medicine

## 2017-02-24 DIAGNOSIS — R21 Rash and other nonspecific skin eruption: Secondary | ICD-10-CM

## 2017-02-24 NOTE — Telephone Encounter (Signed)
I would recommend we refer him to Dermatology at this point.  See what he thinks about that

## 2017-02-24 NOTE — Telephone Encounter (Signed)
Pt states his groin rash has returned. He has used nystatin cream twice (once for 2 weeks, then again for 3 weeks), he also had taken oral diflucan. Questions if he should get another Rx for a topical cream, or if he should try something new. Will route.

## 2017-02-24 NOTE — Telephone Encounter (Signed)
Pt would like to go ahead with referral, he prefers [place] location. Will place.

## 2017-03-03 ENCOUNTER — Other Ambulatory Visit: Payer: Self-pay | Admitting: Family Medicine

## 2017-03-19 DIAGNOSIS — L249 Irritant contact dermatitis, unspecified cause: Secondary | ICD-10-CM | POA: Diagnosis not present

## 2017-03-19 DIAGNOSIS — L304 Erythema intertrigo: Secondary | ICD-10-CM | POA: Diagnosis not present

## 2017-04-20 ENCOUNTER — Other Ambulatory Visit: Payer: Self-pay | Admitting: Family Medicine

## 2017-04-20 ENCOUNTER — Other Ambulatory Visit: Payer: Self-pay | Admitting: *Deleted

## 2017-04-30 ENCOUNTER — Telehealth: Payer: Self-pay | Admitting: Family Medicine

## 2017-04-30 ENCOUNTER — Encounter: Payer: Self-pay | Admitting: Family Medicine

## 2017-04-30 ENCOUNTER — Other Ambulatory Visit: Payer: Self-pay | Admitting: Family Medicine

## 2017-04-30 ENCOUNTER — Ambulatory Visit (INDEPENDENT_AMBULATORY_CARE_PROVIDER_SITE_OTHER): Payer: Medicare Other | Admitting: Family Medicine

## 2017-04-30 VITALS — BP 113/76 | HR 76 | Wt 228.0 lb

## 2017-04-30 DIAGNOSIS — L84 Corns and callosities: Secondary | ICD-10-CM | POA: Diagnosis not present

## 2017-04-30 DIAGNOSIS — I10 Essential (primary) hypertension: Secondary | ICD-10-CM | POA: Diagnosis not present

## 2017-04-30 DIAGNOSIS — D119 Benign neoplasm of major salivary gland, unspecified: Secondary | ICD-10-CM

## 2017-04-30 DIAGNOSIS — R221 Localized swelling, mass and lump, neck: Secondary | ICD-10-CM | POA: Diagnosis not present

## 2017-04-30 DIAGNOSIS — E119 Type 2 diabetes mellitus without complications: Secondary | ICD-10-CM

## 2017-04-30 LAB — POCT GLYCOSYLATED HEMOGLOBIN (HGB A1C): Hemoglobin A1C: 8.8

## 2017-04-30 MED ORDER — DAPAGLIFLOZIN PRO-METFORMIN ER 10-500 MG PO TB24: 1.0000 | ORAL_TABLET | Freq: Every day | ORAL | 3 refills | Status: DC

## 2017-04-30 MED ORDER — LORAZEPAM 0.5 MG PO TABS: 0.5000 mg | ORAL_TABLET | Freq: Two times a day (BID) | ORAL | 1 refills | Status: DC | PRN

## 2017-04-30 MED ORDER — EMPAGLIFLOZIN-METFORMIN HCL 12.5-500 MG PO TABS: 1.0000 | ORAL_TABLET | Freq: Two times a day (BID) | ORAL | 3 refills | Status: DC

## 2017-04-30 NOTE — Progress Notes (Signed)
Labs ordered for CT

## 2017-04-30 NOTE — Progress Notes (Signed)
Subjective:    CC: DM HPI:  Hypertension- Pt denies chest pain, SOB, dizziness, or heart palpitations.  Taking meds as directed w/o problems.  Denies medication side effects.    Diabetes - no hypoglycemic events. No wounds or sores that are not healing well. No increased thirst or urination. Checking glucose at home. Taking medications as prescribed without any side effects.  He also on me to look at a spot on the bottom of his left foot on the ball of his foot near his great toe.  He says is been there for a while but says it has been getting sore a little lately.  He also wanted to find out when he had his Warthin's tumor last evaluated.  He has a large tumor on the right side of his neck which was evaluated back in 2015 by ENT.  They did a CT and then a needle biopsy for confirmation.  He is a heavy smoker.  He is concerned because it has actually gotten larger over time.  Not expensing any significant soreness or tenderness or irritation but says a friend of his was diagnosed with a similar type tumor and recently found out that his was malignant.  Past medical history, Surgical history, Family history not pertinant except as noted below, Social history, Allergies, and medications have been entered into the medical record, reviewed, and corrections made.   Review of Systems: No fevers, chills, night sweats, weight loss, chest pain, or shortness of breath.   Objective:    General: Well Developed, well nourished, and in no acute distress.  Neuro: Alert and oriented x3, extra-ocular muscles intact, sensation grossly intact.  HEENT: Normocephalic, atraumatic  Skin: Warm and dry, no rashes. Cardiac: Regular rate and rhythm, no murmurs rubs or gallops, no lower extremity edema.  Respiratory: Clear to auscultation bilaterally. Not using accessory muscles, speaking in full sentences.   Impression and Recommendations:    HTN - Well controlled. Continue current regimen. Follow up in  3-4  months.    DM - will try changing to [facility] Jan 1 as seem it is covered on his plan.  This will be in place of the glipizide since I feel like the glipizide is no longer working.  His needs for insulin are continuing to increase.  We also had a long discussion about carbohydrates and how they can increase blood sugar just as much as sugary foods.  He says his blood sugar at home this morning was around 199 but he also ate a large plate of spaghetti for dinner last night.  Left foot callous - explained cuased by friction.  We discussed the importance of making sure that those shoes are fitting well and that it is not too tight or too loose with a food foot is moving and causing friction.  Also encouraged him to use a pumice stone to help remove the dead skin while in the shower.  warthins tumor - gland getting largers -plan for CT of the neck for further evaluation.  After that we should have will be removed forward with a needle guided biopsy.

## 2017-04-30 NOTE — Telephone Encounter (Signed)
Please call pharmacy, I accidnelty sent 30 lorazepam instead of 3. Please see if they can correct that.

## 2017-04-30 NOTE — Patient Instructions (Addendum)
We will order a CT and then a needle aspiration on the gland on your neck.   Finish up your glipizide and after January 1 please try to fill the prescription for this Synjardy.  This will take its place. Continue to watch how much pasta, rice and bread products you are eating.  In general watch her quantity on the carbs.  Increase protein and vegetables.

## 2017-04-30 NOTE — Telephone Encounter (Signed)
Pt needs anxiety meds to take prior to CT scan due to claustrophobia.

## 2017-05-01 DIAGNOSIS — I10 Essential (primary) hypertension: Secondary | ICD-10-CM | POA: Diagnosis not present

## 2017-05-01 LAB — COMPLETE METABOLIC PANEL WITH GFR
AG RATIO: 1.4 (calc) (ref 1.0–2.5)
ALBUMIN MSPROF: 4 g/dL (ref 3.6–5.1)
ALKALINE PHOSPHATASE (APISO): 73 U/L (ref 40–115)
ALT: 18 U/L (ref 9–46)
AST: 14 U/L (ref 10–35)
BUN / CREAT RATIO: 9 (calc) (ref 6–22)
BUN: 11 mg/dL (ref 7–25)
CALCIUM: 8.7 mg/dL (ref 8.6–10.3)
CO2: 23 mmol/L (ref 20–32)
CREATININE: 1.27 mg/dL — AB (ref 0.70–1.18)
Chloride: 103 mmol/L (ref 98–110)
GFR, EST NON AFRICAN AMERICAN: 57 mL/min/{1.73_m2} — AB (ref >=60)
GFR, Est African American: 66 mL/min/{1.73_m2} (ref >=60)
GLOBULIN: 2.8 g/dL (ref 1.9–3.7)
Glucose, Bld: 283 mg/dL — ABNORMAL HIGH (ref 65–99)
Potassium: 4.6 mmol/L (ref 3.5–5.3)
SODIUM: 137 mmol/L (ref 135–146)
Total Bilirubin: 0.3 mg/dL (ref 0.2–1.2)
Total Protein: 6.8 g/dL (ref 6.1–8.1)

## 2017-05-01 MED ORDER — LORAZEPAM 0.5 MG PO TABS: 0.5000 mg | ORAL_TABLET | Freq: Once | ORAL | 0 refills | Status: AC

## 2017-05-01 NOTE — Telephone Encounter (Signed)
Pharmacy deleted the Rx for Lorazepam. Requesting new Rx with directions for imaging protocol. Will route.

## 2017-05-01 NOTE — Telephone Encounter (Signed)
New rx sent

## 2017-05-04 ENCOUNTER — Ambulatory Visit (HOSPITAL_BASED_OUTPATIENT_CLINIC_OR_DEPARTMENT_OTHER)
Admission: RE | Admit: 2017-05-04 | Discharge: 2017-05-04 | Disposition: A | Discharge status: Home / Self Care | Payer: Medicare Other | Source: Ambulatory Visit | Attending: Family Medicine | Admitting: Family Medicine

## 2017-05-04 DIAGNOSIS — R221 Localized swelling, mass and lump, neck: Secondary | ICD-10-CM | POA: Diagnosis not present

## 2017-05-04 DIAGNOSIS — I708 Atherosclerosis of other arteries: Secondary | ICD-10-CM | POA: Insufficient documentation

## 2017-05-04 DIAGNOSIS — D119 Benign neoplasm of major salivary gland, unspecified: Secondary | ICD-10-CM | POA: Diagnosis not present

## 2017-05-04 MED ORDER — IOPAMIDOL (ISOVUE-300) INJECTION 61%
100.0000 mL | Freq: Once | INTRAVENOUS | Status: AC | PRN
  Administered 2017-05-04: 80 mL via INTRAVENOUS

## 2017-05-06 ENCOUNTER — Telehealth: Payer: Self-pay | Admitting: *Deleted

## 2017-05-06 DIAGNOSIS — D119 Benign neoplasm of major salivary gland, unspecified: Secondary | ICD-10-CM

## 2017-05-06 NOTE — Telephone Encounter (Signed)
ENT referral placed per pt request.

## 2017-05-07 DIAGNOSIS — I10 Essential (primary) hypertension: Secondary | ICD-10-CM | POA: Diagnosis not present

## 2017-05-07 DIAGNOSIS — Z7901 Long term (current) use of anticoagulants: Secondary | ICD-10-CM | POA: Diagnosis not present

## 2017-05-07 DIAGNOSIS — K119 Disease of salivary gland, unspecified: Secondary | ICD-10-CM | POA: Diagnosis not present

## 2017-05-07 DIAGNOSIS — K118 Other diseases of salivary glands: Secondary | ICD-10-CM | POA: Insufficient documentation

## 2017-05-07 DIAGNOSIS — E119 Type 2 diabetes mellitus without complications: Secondary | ICD-10-CM | POA: Diagnosis not present

## 2017-05-07 DIAGNOSIS — D110 Benign neoplasm of parotid gland: Secondary | ICD-10-CM | POA: Diagnosis not present

## 2017-05-21 ENCOUNTER — Ambulatory Visit (INDEPENDENT_AMBULATORY_CARE_PROVIDER_SITE_OTHER): Payer: Medicare Other | Admitting: Family Medicine

## 2017-05-21 ENCOUNTER — Encounter: Payer: Self-pay | Admitting: Family Medicine

## 2017-05-21 VITALS — BP 106/66 | HR 74 | Ht 73.0 in | Wt 224.0 lb

## 2017-05-21 DIAGNOSIS — D119 Benign neoplasm of major salivary gland, unspecified: Secondary | ICD-10-CM | POA: Diagnosis not present

## 2017-05-21 DIAGNOSIS — E119 Type 2 diabetes mellitus without complications: Secondary | ICD-10-CM

## 2017-05-21 DIAGNOSIS — Z01818 Encounter for other preprocedural examination: Secondary | ICD-10-CM

## 2017-05-21 NOTE — Patient Instructions (Signed)
Cut your insulin in half the night before surgery.   Hold your Synjardy the day of surgery.  Hold your Plavix for 5 days prior to surgery.   Hold any fish oil products or Aleve or Ibuprofen 7 days prior to surgery.

## 2017-05-21 NOTE — Progress Notes (Signed)
   Subjective:    Patient ID: Jon [name], male    DOB: 23-Oct-1945, 72 y.o.   MRN: [id number]  HPI 72 year old male is here today to discuss that he is having a parotidectomy performed on January 22 at [facility].  He has a very large Warthin's tumor which is getting larger.  In fact he says is continue to grow over the last 3 weeks even after his biopsy.  The biopsy was negative for any type of tumor cells.  He had questions about his Plavix as well as his insulin and medications.  He is currently on Synjardy for diabetes.  Unfortunately when he first started it caused some watery yellow diarrhea that seems to be intermittent.    DM- up to 60 units on his insulin.  Sugar in the last couple of days running under 140 which is improved  See note about the Synjardy above   Review of Systems     Objective:   Physical Exam  Constitutional: He is oriented to person, place, and time. He appears well-developed and well-nourished.  HENT:  Head: Normocephalic and atraumatic.  Large firm palpable mass right anterior cervical region  Smaller lesion but similar on the left anterior cervical region   Eyes: Conjunctivae and EOM are normal.  Cardiovascular: Normal rate.  Pulmonary/Chest: Effort normal.  Neurological: He is alert and oriented to person, place, and time.  Skin: Skin is dry. No pallor.  Psychiatric: He has a normal mood and affect. His behavior is normal.  Vitals reviewed.         Assessment & Plan:  Warthin's tumor - recommend he hold his Plavix for 5 days versus 7 days.  He will give half his dose of insulin the night before surgery and he will skip his Synjardy completely the day of surgery. Hold all NSAIDs for 7 days.  Pre-op scheduled next week    DM- cut insulin in half the night before surgery Hold Synjardy day of surgery

## 2017-05-27 DIAGNOSIS — Q667 Congenital pes cavus: Secondary | ICD-10-CM | POA: Diagnosis not present

## 2017-05-27 DIAGNOSIS — D37030 Neoplasm of uncertain behavior of the parotid salivary glands: Secondary | ICD-10-CM | POA: Diagnosis not present

## 2017-05-27 DIAGNOSIS — E1140 Type 2 diabetes mellitus with diabetic neuropathy, unspecified: Secondary | ICD-10-CM | POA: Diagnosis not present

## 2017-05-27 DIAGNOSIS — L84 Corns and callosities: Secondary | ICD-10-CM | POA: Diagnosis not present

## 2017-05-27 DIAGNOSIS — M7732 Calcaneal spur, left foot: Secondary | ICD-10-CM | POA: Diagnosis not present

## 2017-05-28 ENCOUNTER — Other Ambulatory Visit: Payer: Self-pay | Admitting: Family Medicine

## 2017-05-28 DIAGNOSIS — K119 Disease of salivary gland, unspecified: Secondary | ICD-10-CM | POA: Diagnosis not present

## 2017-05-28 DIAGNOSIS — Z01818 Encounter for other preprocedural examination: Secondary | ICD-10-CM | POA: Diagnosis not present

## 2017-05-28 DIAGNOSIS — E1140 Type 2 diabetes mellitus with diabetic neuropathy, unspecified: Secondary | ICD-10-CM | POA: Diagnosis not present

## 2017-05-28 DIAGNOSIS — M79672 Pain in left foot: Secondary | ICD-10-CM | POA: Diagnosis not present

## 2017-06-02 DIAGNOSIS — K119 Disease of salivary gland, unspecified: Secondary | ICD-10-CM | POA: Diagnosis not present

## 2017-06-02 DIAGNOSIS — E1122 Type 2 diabetes mellitus with diabetic chronic kidney disease: Secondary | ICD-10-CM | POA: Diagnosis not present

## 2017-06-02 DIAGNOSIS — D119 Benign neoplasm of major salivary gland, unspecified: Secondary | ICD-10-CM | POA: Diagnosis not present

## 2017-06-02 DIAGNOSIS — Z7901 Long term (current) use of anticoagulants: Secondary | ICD-10-CM | POA: Diagnosis not present

## 2017-06-02 DIAGNOSIS — E1140 Type 2 diabetes mellitus with diabetic neuropathy, unspecified: Secondary | ICD-10-CM | POA: Diagnosis not present

## 2017-06-02 DIAGNOSIS — I129 Hypertensive chronic kidney disease with stage 1 through stage 4 chronic kidney disease, or unspecified chronic kidney disease: Secondary | ICD-10-CM | POA: Diagnosis not present

## 2017-06-02 DIAGNOSIS — E1142 Type 2 diabetes mellitus with diabetic polyneuropathy: Secondary | ICD-10-CM | POA: Diagnosis not present

## 2017-06-02 DIAGNOSIS — Z9049 Acquired absence of other specified parts of digestive tract: Secondary | ICD-10-CM | POA: Insufficient documentation

## 2017-06-02 DIAGNOSIS — Z79899 Other long term (current) drug therapy: Secondary | ICD-10-CM | POA: Diagnosis not present

## 2017-06-02 DIAGNOSIS — D110 Benign neoplasm of parotid gland: Secondary | ICD-10-CM | POA: Diagnosis not present

## 2017-06-02 DIAGNOSIS — N189 Chronic kidney disease, unspecified: Secondary | ICD-10-CM | POA: Diagnosis not present

## 2017-06-02 DIAGNOSIS — I69951 Hemiplegia and hemiparesis following unspecified cerebrovascular disease affecting right dominant side: Secondary | ICD-10-CM | POA: Diagnosis not present

## 2017-06-02 DIAGNOSIS — Z794 Long term (current) use of insulin: Secondary | ICD-10-CM | POA: Diagnosis not present

## 2017-06-02 DIAGNOSIS — E785 Hyperlipidemia, unspecified: Secondary | ICD-10-CM | POA: Diagnosis not present

## 2017-06-02 DIAGNOSIS — I69351 Hemiplegia and hemiparesis following cerebral infarction affecting right dominant side: Secondary | ICD-10-CM | POA: Diagnosis not present

## 2017-06-02 DIAGNOSIS — N179 Acute kidney failure, unspecified: Secondary | ICD-10-CM | POA: Diagnosis not present

## 2017-06-03 DIAGNOSIS — N189 Chronic kidney disease, unspecified: Secondary | ICD-10-CM | POA: Diagnosis not present

## 2017-06-03 DIAGNOSIS — I129 Hypertensive chronic kidney disease with stage 1 through stage 4 chronic kidney disease, or unspecified chronic kidney disease: Secondary | ICD-10-CM | POA: Diagnosis not present

## 2017-06-03 DIAGNOSIS — E1122 Type 2 diabetes mellitus with diabetic chronic kidney disease: Secondary | ICD-10-CM | POA: Diagnosis not present

## 2017-06-03 DIAGNOSIS — Z7901 Long term (current) use of anticoagulants: Secondary | ICD-10-CM | POA: Diagnosis not present

## 2017-06-03 DIAGNOSIS — I69951 Hemiplegia and hemiparesis following unspecified cerebrovascular disease affecting right dominant side: Secondary | ICD-10-CM | POA: Diagnosis not present

## 2017-06-03 DIAGNOSIS — E1142 Type 2 diabetes mellitus with diabetic polyneuropathy: Secondary | ICD-10-CM | POA: Diagnosis not present

## 2017-06-03 DIAGNOSIS — D110 Benign neoplasm of parotid gland: Secondary | ICD-10-CM | POA: Diagnosis not present

## 2017-06-03 DIAGNOSIS — Z79899 Other long term (current) drug therapy: Secondary | ICD-10-CM | POA: Diagnosis not present

## 2017-06-03 DIAGNOSIS — E785 Hyperlipidemia, unspecified: Secondary | ICD-10-CM | POA: Diagnosis not present

## 2017-06-03 DIAGNOSIS — I69341 Monoplegia of lower limb following cerebral infarction affecting right dominant side: Secondary | ICD-10-CM | POA: Diagnosis not present

## 2017-06-03 DIAGNOSIS — Z794 Long term (current) use of insulin: Secondary | ICD-10-CM | POA: Diagnosis not present

## 2017-06-16 ENCOUNTER — Other Ambulatory Visit: Payer: Self-pay

## 2017-06-16 MED ORDER — EMPAGLIFLOZIN-METFORMIN HCL 12.5-500 MG PO TABS: 1.0000 | ORAL_TABLET | Freq: Two times a day (BID) | ORAL | 1 refills | Status: DC

## 2017-06-17 DIAGNOSIS — G8929 Other chronic pain: Secondary | ICD-10-CM | POA: Diagnosis not present

## 2017-06-17 DIAGNOSIS — M79672 Pain in left foot: Secondary | ICD-10-CM | POA: Diagnosis not present

## 2017-06-17 DIAGNOSIS — E1140 Type 2 diabetes mellitus with diabetic neuropathy, unspecified: Secondary | ICD-10-CM | POA: Diagnosis not present

## 2017-06-17 DIAGNOSIS — Z794 Long term (current) use of insulin: Secondary | ICD-10-CM | POA: Diagnosis not present

## 2017-07-16 ENCOUNTER — Other Ambulatory Visit: Payer: Self-pay | Admitting: Family Medicine

## 2017-07-30 ENCOUNTER — Ambulatory Visit: Payer: Medicare Other | Admitting: Family Medicine

## 2017-08-03 ENCOUNTER — Ambulatory Visit (INDEPENDENT_AMBULATORY_CARE_PROVIDER_SITE_OTHER): Payer: Medicare Other | Admitting: Family Medicine

## 2017-08-03 ENCOUNTER — Encounter: Payer: Self-pay | Admitting: Family Medicine

## 2017-08-03 VITALS — BP 110/71 | HR 81 | Ht 73.0 in | Wt 213.0 lb

## 2017-08-03 DIAGNOSIS — R0602 Shortness of breath: Secondary | ICD-10-CM

## 2017-08-03 DIAGNOSIS — R918 Other nonspecific abnormal finding of lung field: Secondary | ICD-10-CM | POA: Diagnosis not present

## 2017-08-03 DIAGNOSIS — I10 Essential (primary) hypertension: Secondary | ICD-10-CM | POA: Diagnosis not present

## 2017-08-03 DIAGNOSIS — E119 Type 2 diabetes mellitus without complications: Secondary | ICD-10-CM

## 2017-08-03 LAB — POCT GLYCOSYLATED HEMOGLOBIN (HGB A1C): HEMOGLOBIN A1C: 7.4

## 2017-08-03 NOTE — Progress Notes (Signed)
Subjective:    CC: DM, HTN  HPI:  Diabetes - no hypoglycemic events. No wounds or sores that are not healing well. No increased thirst or urination. Checking glucose at home. Taking medications as prescribed without any side effects.   Is been trying to eat better overall but admits he still "cheats".  He did end up decreasing his Levemir from 50 units down to 30 units and has done well with that.  He says most of his blood sugars have been running between 120 -135.  Hypertension- Pt denies chest pain, SOB, dizziness, or heart palpitations.  Taking meds as directed w/o problems.  Denies medication side effects.     He did end up having his parotidectomy in January by Dr. Meredith Leeds.  He says it was a 7-hour surgery.  He still has some residual numbness on the right side of his face but otherwise the surgery went well.  He says the recovery was extremely tough.  He had a Warthin's tumor removed.  Also reports that he occasionally feels short of breath.  He says he notices it more when he stands up or when he is urinating but has not really noticed it with activity or exercise such as golfing.  No change in cough or increased expectoration.  Past medical history, Surgical history, Family history not pertinant except as noted below, Social history, Allergies, and medications have been entered into the medical record, reviewed, and corrections made.   Review of Systems: No fevers, chills, night sweats, weight loss, chest pain, or shortness of breath.   Objective:    General: Well Developed, well nourished, and in no acute distress.  Neuro: Alert and oriented x3, extra-ocular muscles intact, sensation grossly intact.  HEENT: Normocephalic, atraumatic  Skin: Warm and dry, no rashes. Cardiac: Regular rate and rhythm, no murmurs rubs or gallops, no lower extremity edema.  Respiratory: Clear to auscultation bilaterally. Not using accessory muscles, speaking in full sentences.   Impression and  Recommendations:    DM -much improved.  Hemoglobin A1c down significantly from previous.  Now we just need to get the A1c into the 6 range.  We will have him increase his Lantus to 34 units for 2 weeks and if at that point his blood glucoses are not consistently running between 101 125 and increase by 4 more units.  His Iva Boop has been doing really well and he is been happy with this regimen.  HTN - Well controlled. Continue current regimen. Follow up in  6 months.   Status post removal of parotid tumor-he is doing much better overall but still has some persistent numbness.  He is just monitoring the tumor on the left side of his neck for now as it has not changed size recently.  Short of breath-the description of the shortness of breath and when it occurs is very unusual.  It does not sound typical for COPD exacerbation.  Based on his age and risk factors and tobacco use I do think he will be a great candidate for CT cancer lung screening.  Will place referral order.

## 2017-08-03 NOTE — Patient Instructions (Signed)
Increase Levemir to 34 units for 2 weeks. If sugars still not between 100-125 then increase by 4 more units.

## 2017-08-19 ENCOUNTER — Telehealth: Payer: Self-pay | Admitting: Acute Care

## 2017-08-19 DIAGNOSIS — F17210 Nicotine dependence, cigarettes, uncomplicated: Secondary | ICD-10-CM

## 2017-08-19 DIAGNOSIS — Z122 Encounter for screening for malignant neoplasm of respiratory organs: Secondary | ICD-10-CM

## 2017-08-21 NOTE — Telephone Encounter (Signed)
Spoke with pt and scheduled SDMV 09/04/17 2:00 CT ordered Nothing further needed

## 2017-09-04 ENCOUNTER — Telehealth: Payer: Self-pay | Admitting: Acute Care

## 2017-09-04 ENCOUNTER — Inpatient Hospital Stay: Admission: RE | Admit: 2017-09-04 | Payer: Medicare Other | Source: Ambulatory Visit

## 2017-09-04 ENCOUNTER — Encounter: Payer: Medicare Other | Admitting: Acute Care

## 2017-09-04 NOTE — Telephone Encounter (Signed)
Will Route to Judson Roch so she is aware.

## 2017-09-04 NOTE — Telephone Encounter (Signed)
Thanks so much. We will reschedule

## 2017-09-18 ENCOUNTER — Ambulatory Visit (INDEPENDENT_AMBULATORY_CARE_PROVIDER_SITE_OTHER): Payer: Medicare Other | Admitting: Physician Assistant

## 2017-09-18 ENCOUNTER — Encounter: Payer: Self-pay | Admitting: Physician Assistant

## 2017-09-18 VITALS — BP 100/68 | HR 73 | Temp 98.1°F | Wt 210.0 lb

## 2017-09-18 DIAGNOSIS — R0982 Postnasal drip: Secondary | ICD-10-CM | POA: Diagnosis not present

## 2017-09-18 MED ORDER — CETIRIZINE HCL 10 MG PO TABS: 10.0000 mg | ORAL_TABLET | Freq: Every day | ORAL | 3 refills | Status: DC

## 2017-09-18 MED ORDER — FLUTICASONE PROPIONATE 50 MCG/ACT NA SUSP: 1.0000 | Freq: Every day | NASAL | 3 refills | Status: DC

## 2017-09-18 NOTE — Progress Notes (Signed)
HPI:                                                                Jon [name] is a 72 y.o. male who presents to [place] today for sore throat  Sore Throat   This is a new problem. The current episode started in the past 7 days. The problem has been unchanged. Neither (worse at night) side of throat is experiencing more pain than the other. There has been no fever. The pain is moderate ("sore"). Associated symptoms include coughing (PND) and trouble swallowing. Pertinent negatives include no congestion or vomiting. Associated symptoms comments: +rhinorrhea. He has tried nothing for the symptoms.  Pain worse at night/lying flat, improves during the day.    No flowsheet data found.    Past Medical History:  Diagnosis Date  . Diabetes mellitus   . Epididymitis   . Hyperlipemia   . Hypertension   . Stroke [facility]) 7/11   Past Surgical History:  Procedure Laterality Date  . PILONIDAL CYST EXCISION  1960, 61, 62, 63  . VASECTOMY     Social History   Tobacco Use  . Smoking status: Current Every Day Smoker    Types: Cigarettes  . Smokeless tobacco: Never Used  . Tobacco comment: Counseling sheet given in exam room 07-2011   Substance Use Topics  . Alcohol use: No   family history includes Diabetes in his other.    ROS: negative except as noted in the HPI  Medications: Current Outpatient Medications  Medication Sig Dispense Refill  . Blood Glucose Monitoring Suppl (ACCU-CHEK AVIVA PLUS) w/Device KIT Check fasting blood sugar daily and before meals. DM Diagnosis 1 kit prn  . clopidogrel (PLAVIX) 75 MG tablet TAKE 1 TABLET BY MOUTH  DAILY 90 tablet 1  . Empagliflozin-Metformin HCl (SYNJARDY) 12.5-500 MG TABS Take 1 tablet by mouth 2 (two) times daily. Ok to fill after January 1st. 180 tablet 1  . fish oil-omega-3 fatty acids 1000 MG capsule Take 4 g by mouth daily.    [name] glucose blood (ACCU-CHEK AVIVA PLUS) test strip  USE ONE STRIP TO CHECK BLOOD SUGAR TWICE DAILY AS DIRECTED. Dx: E11.9 200 each 6  . Insulin Pen Needle (PEN NEEDLES) 32G X 4 MM MISC Inject 1 each into the skin daily. DX:E11.9 200 each 6  . LEVEMIR FLEXTOUCH 100 UNIT/ML Pen INJECT 10-20 UNITS INTO THE SKIN DAILY AT 10 PM. 30 mL 3  . lisinopril (PRINIVIL,ZESTRIL) 2.5 MG tablet TAKE 1 TABLET BY MOUTH  DAILY 90 tablet 1  . sildenafil (REVATIO) 20 MG tablet Take 2-5 tablets (40-100 mg total) by mouth 3 (three) times daily. 80 tablet 3  . simvastatin (ZOCOR) 40 MG tablet TAKE 1 TABLET BY MOUTH AT  BEDTIME 90 tablet 3   No current facility-administered medications for this visit.    Allergies  Allergen Reactions  . Gabapentin Other (See Comments)    Nightmares.   . Metformin And Related Diarrhea       Objective:  BP 100/68   Pulse 73   Temp 98.1 F (36.7 C) (Oral)   Wt 210 lb (95.3 kg)   SpO2 99%   BMI 27.71 kg/m  Gen:  alert, not ill-appearing, no distress,  appropriate for age 45: head normocephalic without obvious abnormality, conjunctiva and cornea clear, left TM obstructed by cerumen, right TM partially visible and clear, nasal mucosa edematous, no sinus tenderness, oropharynx with clear post-nasal secretions and mild erythema, no edema or exudates, uvula midline, neck supple, no cervical adenopathy, trachea midline Pulm: Normal work of breathing, normal phonation, diminished expiratory phase CV: Normal rate, regular rhythm, s1 and s2 distinct, no murmurs, clicks or rubs  Neuro: alert and oriented x 3, no tremor MSK: extremities atraumatic, normal gait and station Skin: intact, no rashes on exposed skin, no jaundice, no cyanosis Psych: well-groomed, cooperative, good eye contact, euthymic mood, affect mood-congruent, speech is articulate, and thought processes clear and goal-directed    No results found for this or any previous visit (from the past 72 hour(s)). No results found.    Assessment and Plan: 72 y.o. male with    Post-nasal drip - Plan: fluticasone (FLONASE) 50 MCG/ACT nasal spray, cetirizine (ZYRTEC) 10 MG tablet - counseled on daily nasal toileting, especially before bedtime - antihistamine and intranasal steroid   Patient education and anticipatory guidance given Patient agrees with treatment plan Follow-up as needed if symptoms worsen or fail to improve  Darlyne Russian PA-C

## 2017-09-18 NOTE — Patient Instructions (Addendum)
For sore throat: - most likely due to post-nasal secretions - start antihistamine at bedtime (generic Allegra or Zyrtec) - irrigate your nasal passages with a netti pot or nasal saline rinse ([employer] makes one). Make sure you are using sterile/bottled water rather than tap water - after irrigating, use your nasal steroid spray (fluticasone)  Other measures that may help with throat pain: - Tylenol 1000mg  every 8 hours as needed for throat pain. Alternate with Ibuprofen 600mg  every 6 hours - Cepacol throat lozenges - Warm salt water gargles   Postnasal Drip Postnasal drip is the feeling of mucus going down the back of your throat. Mucus is a slimy substance that moistens and cleans your nose and throat, as well as the air pockets in face bones near your forehead and cheeks (sinuses). Small amounts of mucus pass from your nose and sinuses down the back of your throat all the time. This is normal. When you produce too much mucus or the mucus gets too thick, you can feel it. Some common causes of postnasal drip include:  Having more mucus because of: ? A cold or the flu. ? Allergies. ? Cold air. ? Certain medicines.  Having more mucus that is thicker because of: ? A sinus or nasal infection. ? Dry air. ? A food allergy.  Follow these instructions at home: Relieving discomfort  Gargle with a salt-water mixture 3-4 times a day or as needed. To make a salt-water mixture, completely dissolve -1 tsp of salt in 1 cup of warm water.  If the air in your home is dry, use a humidifier to add moisture to the air.  Use a saline spray or container (neti pot) to flush out the nose (nasal irrigation). These methods can help clear away mucus and keep the nasal passages moist. General instructions  Take over-the-counter and prescription medicines only as told by your health care provider.  Follow instructions from your health care provider about eating or drinking restrictions. You may need to avoid  caffeine.  Avoid things that you know you are allergic to (allergens), like dust, mold, pollen, pets, or certain foods.  Drink enough fluid to keep your urine pale yellow.  Keep all follow-up visits as told by your health care provider. This is important. Contact a health care provider if:  You have a fever.  You have a sore throat.  You have difficulty swallowing.  You have headache.  You have sinus pain.  You have a cough that does not go away.  The mucus from your nose becomes thick and is green or yellow in color.  You have cold or flu symptoms that last more than 10 days. Summary  Postnasal drip is the feeling of mucus going down the back of your throat.  If your health care provider approves, use nasal irrigation or a nasal spray 2?4 times a day.  Avoid things that you know you are allergic to (allergens), like dust, mold, pollen, pets, or certain foods. This information is not intended to replace advice given to you by your health care provider. Make sure you discuss any questions you have with your health care provider. Document Released: 08/11/2016 Document Revised: 08/11/2016 Document Reviewed: 08/11/2016 Elsevier Interactive Patient Education  [employer].

## 2017-09-21 ENCOUNTER — Ambulatory Visit (INDEPENDENT_AMBULATORY_CARE_PROVIDER_SITE_OTHER): Payer: Medicare Other | Admitting: Acute Care

## 2017-09-21 ENCOUNTER — Encounter: Payer: Self-pay | Admitting: Acute Care

## 2017-09-21 ENCOUNTER — Ambulatory Visit (INDEPENDENT_AMBULATORY_CARE_PROVIDER_SITE_OTHER)
Admission: RE | Admit: 2017-09-21 | Discharge: 2017-09-21 | Disposition: A | Discharge status: Home / Self Care | Payer: Medicare Other | Source: Ambulatory Visit | Attending: Acute Care | Admitting: Acute Care

## 2017-09-21 DIAGNOSIS — F17210 Nicotine dependence, cigarettes, uncomplicated: Secondary | ICD-10-CM

## 2017-09-21 DIAGNOSIS — Z122 Encounter for screening for malignant neoplasm of respiratory organs: Secondary | ICD-10-CM

## 2017-09-21 NOTE — Progress Notes (Signed)
Shared Decision Making Visit Lung Cancer Screening Program [phone])   Eligibility:  Age 72 y.o.  Pack Years Smoking History Calculation 114 pack year smoking history (# packs/per year x # years smoked)  Recent History of coughing up blood  no  Unexplained weight loss? no ( >Than 15 pounds within the last 6 months )  Prior History Lung / other cancer no (Diagnosis within the last 5 years already requiring surveillance chest CT Scans).  Smoking Status Current Smoker  Former Smokers: Years since quit:NA  Quit Date: NA  Visit Components:  Discussion included one or more decision making aids. yes  Discussion included risk/benefits of screening. yes  Discussion included potential follow up diagnostic testing for abnormal scans. yes  Discussion included meaning and risk of over diagnosis. yes  Discussion included meaning and risk of False Positives. yes  Discussion included meaning of total radiation exposure. yes  Counseling Included:  Importance of adherence to annual lung cancer LDCT screening. yes  Impact of comorbidities on ability to participate in the program. yes  Ability and willingness to under diagnostic treatment. yes  Smoking Cessation Counseling:  Current Smokers:   Discussed importance of smoking cessation. yes  Information about tobacco cessation classes and interventions provided to patient. yes  Patient provided with "ticket" for LDCT Scan. yes  Symptomatic Patient. no  Counseling  Diagnosis Code: Tobacco Use Z72.0  Asymptomatic Patient yes  Counseling (Intermediate counseling: > three minutes counseling) U2353  Former Smokers:   Discussed the importance of maintaining cigarette abstinence. yes  Diagnosis Code: Personal History of Nicotine Dependence. I14.431  Information about tobacco cessation classes and interventions provided to patient. Yes  Patient provided with "ticket" for LDCT Scan. yes  Written Order for Lung Cancer  Screening with LDCT placed in Epic. Yes (CT Chest Lung Cancer Screening Low Dose W/O CM) [id number] Z12.2-Screening of respiratory organs Z87.891-Personal history of nicotine dependence\  I have spent 25 minutes of face to face time with Jon [name] discussing the risks and benefits of lung cancer screening. We viewed a power point together that explained in detail the above noted topics. We paused at intervals to allow for questions to be asked and answered to ensure understanding.We discussed that the single most powerful action that he can take to decrease his risk of developing lung cancer is to quit smoking. We discussed whether or not he is ready to commit to setting a quit date. He is currently not ready to set a quit date.We discussed options for tools to aid in quitting smoking including nicotine replacement therapy, non-nicotine medications, support groups, Quit Smart classes, and behavior modification. We discussed that often times setting smaller, more achievable goals, such as eliminating 1 cigarette a day for a week and then 2 cigarettes a day for a week can be helpful in slowly decreasing the number of cigarettes smoked. This allows for a sense of accomplishment as well as providing a clinical benefit. I gave him the " Be Stronger Than Your Excuses" card with contact information for community resources, classes, free nicotine replacement therapy, and access to mobile apps, text messaging, and on-line smoking cessation help. I have also given him my card and contact information in the event he needs to contact me. We discussed the time and location of the scan, and that either Doroteo Glassman RN or I will call with the results within 24-48 hours of receiving them. I have offered him  a copy of the power point we viewed  as a  resource in the event they need reinforcement of the concepts we discussed today in the office. The patient verbalized understanding of all of  the above and had no further questions  upon leaving the office. They have my contact information in the event they have any further questions.  I spent 4 minutes counseling on smoking cessation and the health risks of continued tobacco abuse.  I explained to the patient that there has been a high incidence of coronary artery disease noted on these exams. I explained that this is a non-gated exam therefore degree or severity cannot be determined. This patient is currently on statin therapy. I have asked the patient to follow-up with their PCP regarding any incidental finding of coronary artery disease and management with diet or medication as their PCP  feels is clinically indicated. The patient verbalized understanding of the above and had no further questions upon completion of the visit.      Jon Spatz, NP 09/21/2017 3:28 PM

## 2017-09-28 ENCOUNTER — Telehealth: Payer: Self-pay | Admitting: Acute Care

## 2017-09-28 DIAGNOSIS — F17210 Nicotine dependence, cigarettes, uncomplicated: Principal | ICD-10-CM

## 2017-09-28 DIAGNOSIS — Z122 Encounter for screening for malignant neoplasm of respiratory organs: Secondary | ICD-10-CM

## 2017-09-30 NOTE — Telephone Encounter (Signed)
Pt informed of CT results per Sarah Groce, NP.  PT verbalized understanding.  Copy sent to PCP.  Order placed for 1 yr f/u CT.  

## 2017-10-21 ENCOUNTER — Other Ambulatory Visit: Payer: Self-pay | Admitting: Family Medicine

## 2017-10-21 DIAGNOSIS — E119 Type 2 diabetes mellitus without complications: Secondary | ICD-10-CM

## 2017-11-03 ENCOUNTER — Encounter: Payer: Self-pay | Admitting: Family Medicine

## 2017-11-03 ENCOUNTER — Ambulatory Visit (INDEPENDENT_AMBULATORY_CARE_PROVIDER_SITE_OTHER): Payer: Medicare Other | Admitting: Family Medicine

## 2017-11-03 VITALS — BP 130/76 | HR 77 | Ht 73.0 in | Wt 216.0 lb

## 2017-11-03 DIAGNOSIS — E1122 Type 2 diabetes mellitus with diabetic chronic kidney disease: Secondary | ICD-10-CM

## 2017-11-03 DIAGNOSIS — N183 Chronic kidney disease, stage 3 (moderate): Secondary | ICD-10-CM | POA: Diagnosis not present

## 2017-11-03 DIAGNOSIS — R0789 Other chest pain: Secondary | ICD-10-CM | POA: Diagnosis not present

## 2017-11-03 DIAGNOSIS — E1142 Type 2 diabetes mellitus with diabetic polyneuropathy: Secondary | ICD-10-CM | POA: Diagnosis not present

## 2017-11-03 DIAGNOSIS — E119 Type 2 diabetes mellitus without complications: Secondary | ICD-10-CM | POA: Diagnosis not present

## 2017-11-03 DIAGNOSIS — J432 Centrilobular emphysema: Secondary | ICD-10-CM | POA: Diagnosis not present

## 2017-11-03 DIAGNOSIS — I10 Essential (primary) hypertension: Secondary | ICD-10-CM

## 2017-11-03 DIAGNOSIS — R002 Palpitations: Secondary | ICD-10-CM | POA: Diagnosis not present

## 2017-11-03 LAB — POCT GLYCOSYLATED HEMOGLOBIN (HGB A1C): HEMOGLOBIN A1C: 8.3 % — AB (ref 4.0–5.6)

## 2017-11-03 LAB — POCT UA - MICROALBUMIN
Albumin/Creatinine Ratio, Urine, POC: <30
CREATININE, POC: 100 mg/dL
Microalbumin Ur, POC: 10 mg/L

## 2017-11-03 LAB — BASIC METABOLIC PANEL WITH GFR
BUN/Creatinine Ratio: 9 (calc) (ref 6–22)
BUN: 13 mg/dL (ref 7–25)
CALCIUM: 9.2 mg/dL (ref 8.6–10.3)
CO2: 26 mmol/L (ref 20–32)
CREATININE: 1.47 mg/dL — AB (ref 0.70–1.18)
Chloride: 102 mmol/L (ref 98–110)
GFR, EST NON AFRICAN AMERICAN: 47 mL/min/{1.73_m2} — AB (ref >=60)
GFR, Est African American: 55 mL/min/{1.73_m2} — ABNORMAL LOW (ref >=60)
Glucose, Bld: 224 mg/dL — ABNORMAL HIGH (ref 65–99)
POTASSIUM: 4.7 mmol/L (ref 3.5–5.3)
Sodium: 136 mmol/L (ref 135–146)

## 2017-11-03 MED ORDER — GLIPIZIDE 5 MG PO TABS: 5.0000 mg | ORAL_TABLET | Freq: Two times a day (BID) | ORAL | 3 refills | Status: DC

## 2017-11-03 NOTE — Patient Instructions (Signed)
Increase Levemir to 36 units daily.

## 2017-11-03 NOTE — Progress Notes (Signed)
Subjective:    CC:   HPI:  Diabetes - no hypoglycemic events. No wounds or sores that are not healing well. No increased thirst or urination. Checking glucose at home. Taking medications as prescribed without any side effects. We upped Levemir to 34 untis, but he is only taking 30 units.  pt reports that he has not been taking the synjardy x1 mo this is not covered by is insurance and costs him $400, also he has been taking 30 units of Levemir and he stated that this is also expensive and costs him $300. he takes 1,000 mg of metformin BID  Hypertension- Pt denies chest pain, SOB, dizziness, or heart palpitations.  Taking meds as directed w/o problems.  Denies medication side effects.    F/U neuropathy - Stable. No recent changes.   CKD 3 - No recent changes.      COPD-he did want to go over his results that he had for the CT lung cancer screening.  It did note some mild centrilobular emphysematous changes.   He is also concerned today because he is been having some intermittent chest discomfort.  He says at night often times if he is watching TV or trying to rest he will feel like his heart just suddenly flutters and then he will feel a little bit of a discomfort with she says it is not a pain.  The whole episode just last a second or 2.  But it seems to be happening frequently.  It really does not bother him during the day.  He is able to walk or go up a flight of stairs without feeling any chest discomfort.  He does consume caffeine.  Past medical history, Surgical history, Family history not pertinant except as noted below, Social history, Allergies, and medications have been entered into the medical record, reviewed, and corrections made.   Review of Systems: No fevers, chills, night sweats, weight loss, chest pain, or shortness of breath.   Objective:    General: Well Developed, well nourished, and in no acute distress.  Neuro: Alert and oriented x3, extra-ocular muscles intact,  sensation grossly intact.  HEENT: Normocephalic, atraumatic  Skin: Warm and dry, no rashes. Cardiac: Regular rate and rhythm, no murmurs rubs or gallops, no lower extremity edema.  Respiratory: Clear to auscultation bilaterally. Not using accessory muscles, speaking in full sentences.   Impression and Recommendations:    DM - Uncontrolled. Continue current regimen. Follow up in  3 months. Continue statin.  Increase Levemir to 36 units daily.   HTN -Well controlled. Continue current regimen. Follow up in  4 months.    Periph neuropathy - Stable.    CKD 3 - due to recheck renal function.    Atypical chest pain with flutters-sensation is extremely brief and usually happens at rest which is less concerning for the possibility of cardiac disease but he has been a longtime smoker as well as a diabetic and was noted to have some coronary artery atherosclerosis on his CT lung cancer screening.  We will schedule for exercise treadmill.  He said he lasted 1 about 30 years ago.-  COPD-I went over the CT lung cancer screening with him line by line.  It did indicate some mild centrilobular emphysematous changes so we will update his chart.

## 2017-11-04 ENCOUNTER — Other Ambulatory Visit: Payer: Self-pay | Admitting: *Deleted

## 2017-11-04 DIAGNOSIS — R7989 Other specified abnormal findings of blood chemistry: Secondary | ICD-10-CM

## 2017-11-17 ENCOUNTER — Telehealth (HOSPITAL_COMMUNITY): Payer: Self-pay

## 2017-11-17 NOTE — Telephone Encounter (Signed)
Encounter complete. 

## 2017-11-18 DIAGNOSIS — I2510 Atherosclerotic heart disease of native coronary artery without angina pectoris: Secondary | ICD-10-CM | POA: Insufficient documentation

## 2017-11-18 DIAGNOSIS — I25119 Atherosclerotic heart disease of native coronary artery with unspecified angina pectoris: Secondary | ICD-10-CM | POA: Insufficient documentation

## 2017-11-18 HISTORY — DX: Atherosclerotic heart disease of native coronary artery with unspecified angina pectoris: I25.119

## 2017-11-19 ENCOUNTER — Ambulatory Visit (HOSPITAL_COMMUNITY)
Admission: RE | Admit: 2017-11-19 | Discharge: 2017-11-19 | Disposition: A | Discharge status: Home / Self Care | Payer: Medicare Other | Source: Ambulatory Visit | Attending: Cardiology | Admitting: Cardiology

## 2017-11-19 DIAGNOSIS — R002 Palpitations: Secondary | ICD-10-CM | POA: Insufficient documentation

## 2017-11-19 DIAGNOSIS — R0789 Other chest pain: Secondary | ICD-10-CM | POA: Diagnosis not present

## 2017-11-19 LAB — EXERCISE TOLERANCE TEST
CHL CUP MPHR: 149 {beats}/min
CHL CUP RESTING HR STRESS: 71 {beats}/min
CSEPED: 6 min
Estimated workload: 7 METS
Exercise duration (sec): 1 s
Peak HR: 116 {beats}/min
Percent HR: 77 %
RPE: 19

## 2017-11-23 ENCOUNTER — Telehealth: Payer: Self-pay

## 2017-11-23 MED ORDER — EMPAGLIFLOZIN-METFORMIN HCL 12.5-500 MG PO TABS: 1.0000 | ORAL_TABLET | Freq: Two times a day (BID) | ORAL | 1 refills | Status: DC

## 2017-11-23 NOTE — Telephone Encounter (Signed)
New prescription sent to mail order.

## 2017-11-23 NOTE — Telephone Encounter (Signed)
Pt called stating that he had recently gone in the donut hole and had to change to metformin from [place].  Pt states he now has the couple hundred dollars for [facility] and wants to restart that and get a 90 day supply from [employer] of taking the plain Metformin.  Iva Boop has been discontinued from pt's med list.   If you are OK with pt switching back, can you please send RX to mail order?  Thanks!!

## 2017-11-23 NOTE — Addendum Note (Signed)
Addended by: Beatrice Lecher D on: 11/23/2017 12:21 PM   Modules accepted: Orders

## 2017-11-25 ENCOUNTER — Telehealth: Payer: Self-pay

## 2017-11-25 NOTE — Telephone Encounter (Signed)
OK, gotcha.  OK to hold the glipizide.

## 2017-11-25 NOTE — Telephone Encounter (Signed)
Yes, please stay on both.  If he is having any lows or hypoglycemic events and please let me know.

## 2017-11-25 NOTE — Telephone Encounter (Signed)
Jon [name] called and left a message asking should he be on both the Synjardy and glipizide. Please advise.

## 2017-11-25 NOTE — Telephone Encounter (Signed)
Pt advised to hold glipizide. No further questions.Jon [name], Jon [name], CMA

## 2017-11-25 NOTE — Telephone Encounter (Signed)
Pt states the last time he was on [place], he had to be taken off the glipizide due to low sugars. He was put back on glipizide when he went into the donut hole and had to get the metformin. Now that he is getting the synjardy again, he worries to take them together. Routing for clarification.

## 2017-12-02 ENCOUNTER — Encounter: Payer: Self-pay | Admitting: *Deleted

## 2017-12-02 ENCOUNTER — Encounter: Payer: Self-pay | Admitting: Cardiology

## 2017-12-02 ENCOUNTER — Ambulatory Visit (INDEPENDENT_AMBULATORY_CARE_PROVIDER_SITE_OTHER): Payer: Medicare Other | Admitting: Cardiology

## 2017-12-02 VITALS — BP 98/70 | HR 86 | Ht 73.0 in | Wt 210.0 lb

## 2017-12-02 DIAGNOSIS — R0609 Other forms of dyspnea: Secondary | ICD-10-CM | POA: Insufficient documentation

## 2017-12-02 DIAGNOSIS — F17210 Nicotine dependence, cigarettes, uncomplicated: Secondary | ICD-10-CM

## 2017-12-02 DIAGNOSIS — R0602 Shortness of breath: Secondary | ICD-10-CM

## 2017-12-02 DIAGNOSIS — Z8679 Personal history of other diseases of the circulatory system: Secondary | ICD-10-CM

## 2017-12-02 DIAGNOSIS — Z8673 Personal history of transient ischemic attack (TIA), and cerebral infarction without residual deficits: Secondary | ICD-10-CM | POA: Insufficient documentation

## 2017-12-02 DIAGNOSIS — E088 Diabetes mellitus due to underlying condition with unspecified complications: Secondary | ICD-10-CM | POA: Diagnosis not present

## 2017-12-02 DIAGNOSIS — I7090 Unspecified atherosclerosis: Secondary | ICD-10-CM | POA: Diagnosis not present

## 2017-12-02 DIAGNOSIS — R079 Chest pain, unspecified: Secondary | ICD-10-CM | POA: Diagnosis not present

## 2017-12-02 DIAGNOSIS — E785 Hyperlipidemia, unspecified: Secondary | ICD-10-CM

## 2017-12-02 NOTE — Progress Notes (Signed)
Cardiology Office Note:    Date:  12/02/2017   ID:  [name], DOB 1945/10/09, MRN [id number]  PCP:  [name], MD  Cardiologist:  [name], MD   Referring MD: [name], *    ASSESSMENT:    1. Atherosclerotic vascular disease   2. History of hypertension   3. Diabetes mellitus due to underlying condition with complication, without long-term current use of insulin ([place])   4. Continuous dependence on cigarette smoking   5. History of stroke   6. Shortness of breath on exertion    PLAN:    In order of problems listed above:  1. Secondary prevention stressed with the patient.  Importance of compliance with diet and medications stressed and he vocalized understanding. 2. I spent 5 minutes with the patient discussing solely about smoking. Smoking cessation was counseled. I suggested to the patient also different medications and pharmacological interventions. Patient is keen to try stopping on its own at this time. He will get back to me if he needs any further assistance in this matter. 3. His blood pressure is low and therefore in spite of the fact that he is a diabetic I will stop his lisinopril.  He is having issues with hypotension especially when he changes posture and I fear that he may have a syncope from these episodes.  Benefits risks explained and he vocalized understanding. 4. I will assess his lipids for any more aggressive risk stratification for coronary artery disease. 5. In view of his inconclusive stress test he will undergo Lexiscan sestamibi. 6. Patient will be seen in follow-up appointment in 6 months or earlier if the patient has any concerns    Medication Adjustments/Labs and Tests Ordered: Current medicines are reviewed at length with the patient today.  Concerns regarding medicines are outlined above.  No orders of the defined types were placed in this encounter.  No orders of the defined types were placed in this  encounter.    History of Present Illness:    Jon [name] is a 72 y.o. male who is being seen today for the evaluation of abnormal plain treadmill stress test at the request of [name], *.  Patient is a pleasant 72 year old male.  He has past medical history of atherosclerotic vascular disease.  He has atherosclerosis in his aorta based on CT scan report.  He has history of essential hypertension but his blood pressure runs low.  History of diabetes mellitus, dyslipidemia and is an active smoker.  The patient mentions to me that he has been noticing lightheadedness especially when he changes posture.  He notices these issues more in the summer months.  He leads a fairly sedentary lifestyle.  He does not exercise on a regular basis.  His plain treadmill stress test was inconclusive partly because he has weakness in his lower extremity from a stroke and therefore he sent for an evaluation for a more precise evaluation of his coronary artery status.  At the time of my evaluation, the patient is alert awake oriented and in no distress.  Patient denies any chest pain but has some shortness of breath on exertion.  Past Medical History:  Diagnosis Date  . Diabetes mellitus   . Epididymitis   . Hyperlipemia   . Hypertension   . Stroke [facility]) 7/11    Past Surgical History:  Procedure Laterality Date  . PILONIDAL CYST EXCISION  1960, 61, 62, 63  . VASECTOMY  Current Medications: Current Meds  Medication Sig  . Blood Glucose Monitoring Suppl (ACCU-CHEK AVIVA PLUS) w/Device KIT Check fasting blood sugar daily and before meals. DM Diagnosis  . clopidogrel (PLAVIX) 75 MG tablet TAKE 1 TABLET BY MOUTH  DAILY  . fish oil-omega-3 fatty acids 1000 MG capsule Take 4 g by mouth daily.  [name] glipiZIDE (GLUCOTROL) 5 MG tablet Take 1 tablet (5 mg total) by mouth 2 (two) times daily before a meal.  . glucose blood (ACCU-CHEK AVIVA PLUS) test strip USE ONE STRIP TO CHECK BLOOD SUGAR TWICE  DAILY AS DIRECTED. Dx: E11.9  . Insulin Pen Needle (B-D ULTRAFINE III SHORT PEN) 31G X 8 MM MISC INJECT INTO SKIN DAILY.  [name] LEVEMIR FLEXTOUCH 100 UNIT/ML Pen INJECT 10-20 UNITS INTO THE SKIN DAILY AT 10 PM.  . lisinopril (PRINIVIL,ZESTRIL) 2.5 MG tablet TAKE 1 TABLET BY MOUTH  DAILY  . metFORMIN (GLUCOPHAGE-XR) 500 MG 24 hr tablet Take 1,000 mg by mouth 2 (two) times daily with a meal.  . sildenafil (REVATIO) 20 MG tablet Take 2-5 tablets (40-100 mg total) by mouth 3 (three) times daily.  . simvastatin (ZOCOR) 40 MG tablet TAKE 1 TABLET BY MOUTH AT  BEDTIME     Allergies:   Gabapentin and Metformin and related   Social History   Socioeconomic History  . Marital status: Divorced    Spouse name: Not on file  . Number of children: 2  . Years of education: Not on file  . Highest education level: Not on file  Occupational History  . Occupation: Retired  Scientific laboratory technician  . Financial resource strain: Not on file  . Food insecurity:    Worry: Not on file    Inability: Not on file  . Transportation needs:    Medical: Not on file    Non-medical: Not on file  Tobacco Use  . Smoking status: Current Every Day Smoker    Packs/day: 2.00    Years: 58.00    Pack years: 116.00    Types: Cigarettes  . Smokeless tobacco: Never Used  . Tobacco comment: Counseling sheet given in exam room 07-2011   Substance and Sexual Activity  . Alcohol use: No  . Drug use: No  . Sexual activity: Not on file  Lifestyle  . Physical activity:    Days per week: Not on file    Minutes per session: Not on file  . Stress: Not on file  Relationships  . Social connections:    Talks on phone: Not on file    Gets together: Not on file    Attends religious service: Not on file    Active member of club or organization: Not on file    Attends meetings of clubs or organizations: Not on file    Relationship status: Not on file  Other Topics Concern  . Not on file  Social History Narrative   Daily caffeine       Family History: The patient's family history includes Diabetes in his other. There is no history of Colon cancer.  ROS:   Please see the history of present illness.    All other systems reviewed and are negative.  EKGs/Labs/Other Studies Reviewed:    The following studies were reviewed today: EKG reveals sinus rhythm and nonspecific ST-T changes.   Recent Labs: 05/01/2017: ALT 18 11/03/2017: BUN 13; Creat 1.47; Potassium 4.7; Sodium 136  Recent Lipid Panel    Component Value Date/Time   CHOL 154 01/30/2017 0843   TRIG  150 (H) 01/30/2017 0843   HDL 37 (L) 01/30/2017 0843   CHOLHDL 4.2 01/30/2017 0843   VLDL 39 (H) 12/25/2015 1119   LDLCALC 92 01/30/2017 0843    Physical Exam:    VS:  BP 98/70 (BP Location: Left Arm, Patient Position: Sitting, Cuff Size: Normal)   Pulse 86   Ht '6\' 1"'$  (1.854 m)   Wt 210 lb (95.3 kg)   SpO2 97%   BMI 27.71 kg/m     Wt Readings from Last 3 Encounters:  12/02/17 210 lb (95.3 kg)  11/03/17 216 lb (98 kg)  09/18/17 210 lb (95.3 kg)     GEN: Patient is in no acute distress HEENT: Normal NECK: No JVD; No carotid bruits LYMPHATICS: No lymphadenopathy CARDIAC: S1 S2 regular, 2/6 systolic murmur at the apex. RESPIRATORY:  Clear to auscultation without rales, wheezing or rhonchi  ABDOMEN: Soft, non-tender, non-distended MUSCULOSKELETAL:  No edema; No deformity  SKIN: Warm and dry NEUROLOGIC:  Alert and oriented x 3 PSYCHIATRIC:  Normal affect    Signed, [name], MD  12/02/2017 10:57 AM    [place]

## 2017-12-02 NOTE — Patient Instructions (Signed)
Medication Instructions:  Your physician has recommended you make the following change in your medication:   STOP: Lisinopril   Labwork: Your physician recommends that you return for lab work when you have lexiscan: BMP, hepatic function panel, Lipid panel. Please fast before lab work.  Testing/Procedures: You had an ekg today.  Your physician has requested that you have a lexiscan myoview. For further information please visit [email or web address]. Please follow instruction sheet, as given.    Follow-Up: Your physician wants you to follow-up in: 6 months. You will receive a reminder letter in the mail two months in advance. If you don't receive a letter, please call our office to schedule the follow-up appointment.   Any Other Special Instructions Will Be Listed Below (If Applicable).     If you need a refill on your cardiac medications before your next appointment, please call your pharmacy.   Cardiac Nuclear Scan A cardiac nuclear scan is a test that measures blood flow to the heart when a person is resting and when he or she is exercising. The test looks for problems such as:  Not enough blood reaching a portion of the heart.  The heart muscle not working normally.  You may need this test if:  You have heart disease.  You have had abnormal lab results.  You have had heart surgery or angioplasty.  You have chest pain.  You have shortness of breath.  In this test, a radioactive dye (tracer) is injected into your bloodstream. After the tracer has traveled to your heart, an imaging device is used to measure how much of the tracer is absorbed by or distributed to various areas of your heart. This procedure is usually done at a hospital and takes 2-4 hours. Tell a health care provider about:  Any allergies you have.  All medicines you are taking, including vitamins, herbs, eye drops, creams, and over-the-counter medicines.  Any problems you or family members have had  with the use of anesthetic medicines.  Any blood disorders you have.  Any surgeries you have had.  Any medical conditions you have.  Whether you are pregnant or may be pregnant. What are the risks? Generally, this is a safe procedure. However, problems may occur, including:  Serious chest pain and heart attack. This is only a risk if the stress portion of the test is done.  Rapid heartbeat.  Sensation of warmth in your chest. This usually passes quickly.  What happens before the procedure?  Ask your health care provider about changing or stopping your regular medicines. This is especially important if you are taking diabetes medicines or blood thinners.  Remove your jewelry on the day of the procedure. What happens during the procedure?  An IV tube will be inserted into one of your veins.  Your health care provider will inject a small amount of radioactive tracer through the tube.  You will wait for 20-40 minutes while the tracer travels through your bloodstream.  Your heart activity will be monitored with an electrocardiogram (ECG).  You will lie down on an exam table.  Images of your heart will be taken for about 15-20 minutes.  You may be asked to exercise on a treadmill or stationary bike. While you exercise, your heart's activity will be monitored with an ECG, and your blood pressure will be checked. If you are unable to exercise, you may be given a medicine to increase blood flow to parts of your heart.  When blood flow to your heart has  peaked, a tracer will again be injected through the IV tube.  After 20-40 minutes, you will get back on the exam table and have more images taken of your heart.  When the procedure is over, your IV tube will be removed. The procedure may vary among health care providers and hospitals. Depending on the type of tracer used, scans may need to be repeated 3-4 hours later. What happens after the procedure?  Unless your health care  provider tells you otherwise, you may return to your normal schedule, including diet, activities, and medicines.  Unless your health care provider tells you otherwise, you may increase your fluid intake. This will help flush the contrast dye from your body. Drink enough fluid to keep your urine clear or pale yellow.  It is up to you to get your test results. Ask your health care provider, or the department that is doing the test, when your results will be ready. Summary  A cardiac nuclear scan measures the blood flow to the heart when a person is resting and when he or she is exercising.  You may need this test if you are at risk for heart disease.  Tell your health care provider if you are pregnant.  Unless your health care provider tells you otherwise, increase your fluid intake. This will help flush the contrast dye from your body. Drink enough fluid to keep your urine clear or pale yellow. This information is not intended to replace advice given to you by your health care provider. Make sure you discuss any questions you have with your health care provider. Document Released: 05/23/2004 Document Revised: 04/30/2016 Document Reviewed: 04/06/2013 Elsevier Interactive Patient Education  2017 [employer].

## 2017-12-03 ENCOUNTER — Telehealth (HOSPITAL_COMMUNITY): Payer: Self-pay | Admitting: *Deleted

## 2017-12-03 NOTE — Telephone Encounter (Signed)
Patient given detailed instructions per Myocardial Perfusion Study Information Sheet for the test on 12/08/17. Patient notified to arrive 15 minutes early and that it is imperative to arrive on time for appointment to keep from having the test rescheduled.  If you need to cancel or reschedule your appointment, please call the office within 24 hours of your appointment. . Patient verbalized understanding. Jon [name]

## 2017-12-08 ENCOUNTER — Ambulatory Visit (HOSPITAL_COMMUNITY): Payer: Medicare Other | Attending: Cardiology

## 2017-12-08 VITALS — Ht 73.0 in | Wt 210.0 lb

## 2017-12-08 DIAGNOSIS — R0602 Shortness of breath: Secondary | ICD-10-CM

## 2017-12-08 DIAGNOSIS — R079 Chest pain, unspecified: Secondary | ICD-10-CM | POA: Insufficient documentation

## 2017-12-08 LAB — MYOCARDIAL PERFUSION IMAGING
LHR: 0.3
LV dias vol: 72 mL (ref 62–150)
LVSYSVOL: 20 mL
NUC STRESS TID: 1.03
Peak HR: 125 {beats}/min
Rest HR: 81 {beats}/min
SDS: 2
SRS: 5
SSS: 7

## 2017-12-08 MED ORDER — TECHNETIUM TC 99M TETROFOSMIN IV KIT
10.4000 | PACK | Freq: Once | INTRAVENOUS | Status: AC | PRN
  Administered 2017-12-08: 10.4 via INTRAVENOUS
  Filled 2017-12-08: qty 11

## 2017-12-08 MED ORDER — TECHNETIUM TC 99M TETROFOSMIN IV KIT
32.4000 | PACK | Freq: Once | INTRAVENOUS | Status: AC | PRN
  Administered 2017-12-08: 32.4 via INTRAVENOUS
  Filled 2017-12-08: qty 33

## 2017-12-08 MED ORDER — REGADENOSON 0.4 MG/5ML IV SOLN
0.4000 mg | Freq: Once | INTRAVENOUS | Status: AC
  Administered 2017-12-08: 0.4 mg via INTRAVENOUS

## 2017-12-09 ENCOUNTER — Encounter: Payer: Self-pay | Admitting: *Deleted

## 2017-12-29 ENCOUNTER — Telehealth: Payer: Self-pay | Admitting: *Deleted

## 2017-12-29 NOTE — Telephone Encounter (Signed)
Left message per DPR reminding patient of overdue lab work that needs to be drawn. Advised him to fast beforehand and to come to our [facility] office one day this week to get this done. Informed him to contact our office with further questions or concerns.

## 2017-12-29 NOTE — Telephone Encounter (Signed)
Pt is aware needs to come in this week for fasting blood work. Pt stated would come in one day this week and verified address.

## 2018-01-07 ENCOUNTER — Other Ambulatory Visit: Payer: Self-pay | Admitting: Family Medicine

## 2018-01-07 MED ORDER — METFORMIN HCL ER 500 MG PO TB24: 1000.0000 mg | ORAL_TABLET | Freq: Two times a day (BID) | ORAL | 1 refills | Status: DC

## 2018-01-13 ENCOUNTER — Telehealth: Payer: Self-pay

## 2018-01-13 MED ORDER — METFORMIN HCL 1000 MG PO TABS: 1000.0000 mg | ORAL_TABLET | Freq: Two times a day (BID) | ORAL | 1 refills | Status: DC

## 2018-01-13 NOTE — Telephone Encounter (Signed)
Patient states he switches from [place] to metformin altercating because he can't afford the Synjardy through out the year. He will take Synjardy 12.5mg  / 1000 mg BID for 90 day then Metformin 1000 mg bid for 90 days.

## 2018-01-14 NOTE — Telephone Encounter (Signed)
Okay, that is perfectly fine.  I sent a notification back to the pharmacy to fill both medications.  I think they were confused as well so I asked them to fill both medicines.  He can let us know if he has any problems getting the medication.

## 2018-01-14 NOTE — Telephone Encounter (Signed)
[facility] advised. 

## 2018-02-04 ENCOUNTER — Other Ambulatory Visit: Payer: Self-pay | Admitting: Family Medicine

## 2018-02-04 ENCOUNTER — Ambulatory Visit: Payer: Medicare Other | Admitting: Family Medicine

## 2018-02-08 NOTE — Progress Notes (Deleted)
Subjective:   Columbus Ice is a 72 y.o. male who presents for an Initial Medicare Annual Wellness Visit.  Review of Systems  No ROS.  Medicare Wellness Visit. Additional risk factors are reflected in the social history.     Sleep patterns  Home Safety/Smoke Alarms: Feels safe in home. Smoke alarms in place.  Living environment;   Male:   CCS-    PSA-  Lab Results  Component Value Date   PSA 0.2 12/25/2015      Objective:    There were no vitals filed for this visit. There is no height or weight on file to calculate BMI.  Advanced Directives 09/26/2013 09/06/2013  Does Patient Have a Medical Advance Directive? Patient would not like information;Patient does not have advance directive Patient does not have advance directive    Current Medications (verified) Outpatient Encounter Medications as of 02/16/2018  Medication Sig  . Blood Glucose Monitoring Suppl (ACCU-CHEK AVIVA PLUS) w/Device KIT Check fasting blood sugar daily and before meals. DM Diagnosis  . clopidogrel (PLAVIX) 75 MG tablet TAKE 1 TABLET BY MOUTH  DAILY  . fish oil-omega-3 fatty acids 1000 MG capsule Take 4 g by mouth daily.  [name] glipiZIDE (GLUCOTROL) 5 MG tablet Take 1 tablet (5 mg total) by mouth 2 (two) times daily before a meal.  . glucose blood (ACCU-CHEK AVIVA PLUS) test strip USE ONE STRIP TO CHECK BLOOD SUGAR TWICE DAILY AS DIRECTED. Dx: E11.9  . Insulin Pen Needle (B-D ULTRAFINE III SHORT PEN) 31G X 8 MM MISC INJECT INTO SKIN DAILY.  [name] LEVEMIR FLEXTOUCH 100 UNIT/ML Pen INJECT 10-20 UNITS INTO THE SKIN DAILY AT 10 PM.  . lisinopril (PRINIVIL,ZESTRIL) 2.5 MG tablet TAKE 1 TABLET BY MOUTH  DAILY  . metFORMIN (GLUCOPHAGE) 1000 MG tablet Take 1 tablet (1,000 mg total) by mouth 2 (two) times daily with a meal.  . sildenafil (REVATIO) 20 MG tablet Take 2-5 tablets (40-100 mg total) by mouth 3 (three) times daily.  . simvastatin (ZOCOR) 40 MG tablet TAKE 1 TABLET BY MOUTH AT  BEDTIME   No  facility-administered encounter medications on file as of 02/16/2018.     Allergies (verified) Gabapentin and Metformin and related   History: Past Medical History:  Diagnosis Date  . Diabetes mellitus   . Epididymitis   . Hyperlipemia   . Hypertension   . Stroke [facility]) 7/11   Past Surgical History:  Procedure Laterality Date  . PILONIDAL CYST EXCISION  1960, 61, 62, 64  . VASECTOMY     Family History  Problem Relation Age of Onset  . Diabetes Other        family hx of  . Colon cancer Neg Hx    Social History   Socioeconomic History  . Marital status: Divorced    Spouse name: Not on file  . Number of children: 2  . Years of education: Not on file  . Highest education level: Not on file  Occupational History  . Occupation: Retired  Scientific laboratory technician  . Financial resource strain: Not on file  . Food insecurity:    Worry: Not on file    Inability: Not on file  . Transportation needs:    Medical: Not on file    Non-medical: Not on file  Tobacco Use  . Smoking status: Current Every Day Smoker    Packs/day: 2.00    Years: 58.00    Pack years: 116.00    Types: Cigarettes  . Smokeless tobacco: Never Used  .  Tobacco comment: Counseling sheet given in exam room 07-2011   Substance and Sexual Activity  . Alcohol use: No  . Drug use: No  . Sexual activity: Not on file  Lifestyle  . Physical activity:    Days per week: Not on file    Minutes per session: Not on file  . Stress: Not on file  Relationships  . Social connections:    Talks on phone: Not on file    Gets together: Not on file    Attends religious service: Not on file    Active member of club or organization: Not on file    Attends meetings of clubs or organizations: Not on file    Relationship status: Not on file  Other Topics Concern  . Not on file  Social History Narrative   Daily caffeine    Tobacco Counseling Ready to quit: Not Answered Counseling given: Not Answered Comment: Counseling sheet  given in exam room 07-2011    Clinical Intake:                       Activities of Daily Living No flowsheet data found.   Immunizations and Health Maintenance Immunization History  Administered Date(s) Administered  . Influenza Split 03/06/2011, 03/05/2012  . Influenza Whole 04/14/2009  . Influenza, High Dose Seasonal PF 03/26/2016, 01/27/2017  . Influenza,inj,Quad PF,6+ Mos 03/09/2013, 12/27/2013, 03/21/2015  . Pneumococcal Conjugate-13 09/26/2014  . Pneumococcal Polysaccharide-23 03/15/2009, 12/03/2012  . Tdap 05/12/2008   Health Maintenance Due  Topic Date Due  . OPHTHALMOLOGY EXAM  01/17/2016  . COLONOSCOPY  07/29/2016  . INFLUENZA VACCINE  12/10/2017    Patient Care Team: [name], MD as PCP - General  Indicate any recent Medical Services you may have received from other than [facility] providers in the past year (date may be approximate).    Assessment:   This is a routine wellness examination for [facility]. Physical assessment deferred to PCP.   Hearing/Vision screen No exam data present  Dietary issues and exercise activities discussed:   Diet Breakfast: Lunch:  Dinner:       Goals   None    Depression Screen PHQ 2/9 Scores 01/27/2017 06/30/2016 12/19/2015 03/31/2014  PHQ - 2 Score 0 0 0 0    Fall Risk Fall Risk  01/27/2017 06/30/2016 12/19/2015 03/31/2014 03/09/2013  Falls in the past year? _0     Is the patient's home free of loose throw rugs in walkways, pet beds, electrical cords, etc?   {Blank single:19197::"yes","no"}      Grab bars in the bathroom? {Blank single:19197::"yes","no"}      Handrails on the stairs?   {Blank single:19197::"yes","no"}      Adequate lighting?   {Blank single:19197::"yes","no"}  Cognitive Function:        Screening Tests Health Maintenance  Topic Date Due  . OPHTHALMOLOGY EXAM  01/17/2016  . COLONOSCOPY  07/29/2016  . INFLUENZA VACCINE  12/10/2017  . HEMOGLOBIN A1C  05/05/2018  .  TETANUS/TDAP  05/12/2018  . FOOT EXAM  08/04/2018  . Hepatitis C Screening  Completed  . PNA vac Low Risk Adult  Completed      Plan:   ***  I have personally reviewed and noted the following in the patient's chart:   . Medical and social history . Use of alcohol, tobacco or illicit drugs  . Current medications and supplements . Functional ability and status . Nutritional status . Physical activity . Advanced directives .  List of other physicians . Hospitalizations, surgeries, and ER visits in previous 12 months . Vitals . Screenings to include cognitive, depression, and falls . Referrals and appointments  In addition, I have reviewed and discussed with patient certain preventive protocols, quality metrics, and best practice recommendations. A written personalized care plan for preventive services as well as general preventive health recommendations were provided to patient.     Joanne Chars, LPN   3/38/3291

## 2018-02-09 ENCOUNTER — Encounter: Payer: Self-pay | Admitting: Family Medicine

## 2018-02-09 ENCOUNTER — Ambulatory Visit (INDEPENDENT_AMBULATORY_CARE_PROVIDER_SITE_OTHER): Payer: Medicare Other | Admitting: Family Medicine

## 2018-02-09 VITALS — BP 130/79 | HR 72 | Ht 73.0 in | Wt 212.0 lb

## 2018-02-09 DIAGNOSIS — E1122 Type 2 diabetes mellitus with diabetic chronic kidney disease: Secondary | ICD-10-CM

## 2018-02-09 DIAGNOSIS — Z125 Encounter for screening for malignant neoplasm of prostate: Secondary | ICD-10-CM | POA: Diagnosis not present

## 2018-02-09 DIAGNOSIS — Z23 Encounter for immunization: Secondary | ICD-10-CM

## 2018-02-09 DIAGNOSIS — N183 Chronic kidney disease, stage 3 (moderate): Secondary | ICD-10-CM

## 2018-02-09 DIAGNOSIS — E119 Type 2 diabetes mellitus without complications: Secondary | ICD-10-CM | POA: Diagnosis not present

## 2018-02-09 DIAGNOSIS — I10 Essential (primary) hypertension: Secondary | ICD-10-CM | POA: Diagnosis not present

## 2018-02-09 DIAGNOSIS — R0602 Shortness of breath: Secondary | ICD-10-CM

## 2018-02-09 DIAGNOSIS — J432 Centrilobular emphysema: Secondary | ICD-10-CM

## 2018-02-09 LAB — POCT GLYCOSYLATED HEMOGLOBIN (HGB A1C): Hemoglobin A1C: 6.9 % — AB (ref 4.0–5.6)

## 2018-02-09 NOTE — Progress Notes (Addendum)
Subjective:    CC: DM, BP  HPI:  Diabetes - no hypoglycemic events. No wounds or sores that are not healing well. No increased thirst or urination. Checking glucose at home. Taking medications as prescribed without any side effects.  He has been alternating the Synjardy and the metformin.  He will take the metformin for 90 days and then take the Synjardy for 90 days because of insurance he has been doing this.  He is also been slowly weaning his insulin down and he is now down to 20 units.  Hypertension- Pt denies chest pain, SOB, dizziness, or heart palpitations.  Taking meds as directed w/o problems.  Denies medication side effects.  Cardiology actually stopped his ACE inhibitor because he was having some hypotensive episodes.  Says he is actually felt much better since being off the lisinopril.  He was having multiple episodes while out golfing where he would get lightheaded and almost feel like he would pass out.  That seems to have resolved.  CKD 3-no recent changes.  Due to recheck check renal function.  Off of ACE inhibitor now.  Centrilobular emphysema-no recent flares or exacerbations.  Is not currently on any maintenance medication.  Not have an up-to-date spirometry test on him.  He did have a negative annual CT lung cancer screening in May of this year. He has noticed he is more SOB walking up hills and when he is carrying something heavy.    He has a couple fo lesion on his scalp.    Past medical history, Surgical history, Family history not pertinant except as noted below, Social history, Allergies, and medications have been entered into the medical record, reviewed, and corrections made.   Review of Systems: No fevers, chills, night sweats, weight loss, chest pain, or shortness of breath.   Objective:    General: Well Developed, well nourished, and in no acute distress.  Neuro: Alert and oriented x3, extra-ocular muscles intact, sensation grossly intact.  HEENT: Normocephalic,  atraumatic  Skin: Warm and dry, no rashes. He has a few thickened light Maney superficial lesion on the scalp.  Cardiac: Regular rate and rhythm, no murmurs rubs or gallops, no lower extremity edema.  Respiratory: Clear to auscultation bilaterally. Not using accessory muscles, speaking in full sentences.   Impression and Recommendations:    HTN -controlled.  Continue current regimen.  DM -6.9 which is much improved from previous.  We discussed alternating the Synjardy and metformin every other day versus doing one for 90 days and then switching.  Continue with 20 units of Levemir.  He is on a statin.  CKD 3 -recheck renal function.  Currently off of ACE inhibitor due to hypotension.  Centrilobular emphysema/SOB-we will schedule for spirometry for further evaluation and to better quantify what stage of COPD he is actually in.  He will try to schedule that in the next couple of weeks..  SEborrheic keratoses - gave reassurance. Can be frozen.  Due for PSA.

## 2018-02-16 ENCOUNTER — Ambulatory Visit: Payer: Medicare Other

## 2018-02-17 NOTE — Progress Notes (Deleted)
Subjective:   Mukund Weinreb is a 72 y.o. male who presents for an Initial Medicare Annual Wellness Visit.  Review of Systems  No ROS.  Medicare Wellness Visit. Additional risk factors are reflected in the social history.    Sleep patterns: .   Home Safety/Smoke Alarms: Feels safe in home. Smoke alarms in place.  Living environment;[name]    Male:   CCS-     PSA-  Lab Results  Component Value Date   PSA 0.2 12/25/2015      Objective:    There were no vitals filed for this visit. There is no height or weight on file to calculate BMI.  Advanced Directives 09/26/2013 09/06/2013  Does Patient Have a Medical Advance Directive? Patient would not like information;Patient does not have advance directive Patient does not have advance directive    Current Medications (verified) Outpatient Encounter Medications as of 02/23/2018  Medication Sig  . Blood Glucose Monitoring Suppl (ACCU-CHEK AVIVA PLUS) w/Device KIT Check fasting blood sugar daily and before meals. DM Diagnosis  . clopidogrel (PLAVIX) 75 MG tablet TAKE 1 TABLET BY MOUTH  DAILY  . Empagliflozin-metFORMIN HCl 12.5-500 MG TABS Take 1 tablet by mouth 2 (two) times daily.  . fish oil-omega-3 fatty acids 1000 MG capsule Take 4 g by mouth daily.  [name] glucose blood (ACCU-CHEK AVIVA PLUS) test strip USE ONE STRIP TO CHECK BLOOD SUGAR TWICE DAILY AS DIRECTED. Dx: E11.9  . Insulin Pen Needle (B-D ULTRAFINE III SHORT PEN) 31G X 8 MM MISC INJECT INTO SKIN DAILY.  [name] ketoconazole (NIZORAL) 2 % cream APPLY CREAM TOPICALLY TO AFFECTED AREA ONCE DAILY FOR 30 DAYS  . LEVEMIR FLEXTOUCH 100 UNIT/ML Pen INJECT 10-20 UNITS INTO THE SKIN DAILY AT 10 PM.  . metFORMIN (GLUCOPHAGE) 1000 MG tablet Take 1 tablet (1,000 mg total) by mouth 2 (two) times daily with a meal.  . sildenafil (REVATIO) 20 MG tablet Take 2-5 tablets (40-100 mg total) by mouth 3 (three) times daily.  . simvastatin (ZOCOR) 40 MG tablet TAKE 1 TABLET BY MOUTH AT  BEDTIME   No  facility-administered encounter medications on file as of 02/23/2018.     Allergies (verified) Gabapentin and Metformin and related   History: Past Medical History:  Diagnosis Date  . Diabetes mellitus   . Epididymitis   . Hyperlipemia   . Hypertension   . Stroke [facility]) 7/11   Past Surgical History:  Procedure Laterality Date  . PILONIDAL CYST EXCISION  1960, 61, 62, 63  . VASECTOMY     Family History  Problem Relation Age of Onset  . Diabetes Other        family hx of  . Colon cancer Neg Hx    Social History   Socioeconomic History  . Marital status: Divorced    Spouse name: Not on file  . Number of children: 2  . Years of education: Not on file  . Highest education level: Not on file  Occupational History  . Occupation: Retired  Scientific laboratory technician  . Financial resource strain: Not on file  . Food insecurity:    Worry: Not on file    Inability: Not on file  . Transportation needs:    Medical: Not on file    Non-medical: Not on file  Tobacco Use  . Smoking status: Current Every Day Smoker    Packs/day: 2.00    Years: 58.00    Pack years: 116.00    Types: Cigarettes  . Smokeless tobacco: Never  Used  . Tobacco comment: Counseling sheet given in exam room 07-2011   Substance and Sexual Activity  . Alcohol use: No  . Drug use: No  . Sexual activity: Not on file  Lifestyle  . Physical activity:    Days per week: Not on file    Minutes per session: Not on file  . Stress: Not on file  Relationships  . Social connections:    Talks on phone: Not on file    Gets together: Not on file    Attends religious service: Not on file    Active member of club or organization: Not on file    Attends meetings of clubs or organizations: Not on file    Relationship status: Not on file  Other Topics Concern  . Not on file  Social History Narrative   Daily caffeine    Tobacco Counseling Ready to quit: Not Answered Counseling given: Not Answered Comment: Counseling sheet  given in exam room 07-2011    Clinical Intake:                       Activities of Daily Living No flowsheet data found.   Immunizations and Health Maintenance Immunization History  Administered Date(s) Administered  . Influenza Split 03/06/2011, 03/05/2012  . Influenza Whole 04/14/2009  . Influenza, High Dose Seasonal PF 03/26/2016, 01/27/2017, 02/09/2018  . Influenza,inj,Quad PF,6+ Mos 03/09/2013, 12/27/2013, 03/21/2015  . Pneumococcal Conjugate-13 09/26/2014  . Pneumococcal Polysaccharide-23 03/15/2009, 12/03/2012  . Tdap 05/12/2008   There are no preventive care reminders to display for this patient.  Patient Care Team: [name], MD as PCP - General  Indicate any recent Medical Services you may have received from other than [facility] providers in the past year (date may be approximate).    Assessment:   This is a routine wellness examination for [facility]. Physical assessment deferred to PCP.   Hearing/Vision screen No exam data present  Dietary issues and exercise activities discussed:   Diet  Breakfast: Lunch:  Dinner:           Goals   None    Depression Screen PHQ 2/9 Scores 02/09/2018 01/27/2017 06/30/2016 12/19/2015  PHQ - 2 Score 0 0 0 0    Fall Risk Fall Risk  02/09/2018 01/27/2017 06/30/2016 12/19/2015 03/31/2014  Falls in the past year? No No No No No    Is the patient's home free of loose throw rugs in walkways, pet beds, electrical cords, etc?   {Blank single:19197::"yes","no"}      Grab bars in the bathroom? {Blank single:19197::"yes","no"}      Handrails on the stairs?   {Blank single:19197::"yes","no"}      Adequate lighting?   {Blank single:19197::"yes","no"}    Cognitive Function:        Screening Tests Health Maintenance  Topic Date Due  . OPHTHALMOLOGY EXAM  08/11/2018 (Originally 01/17/2016)  . COLONOSCOPY  02/10/2019 (Originally 07/29/2016)  . TETANUS/TDAP  05/12/2018  . FOOT EXAM  08/04/2018  . HEMOGLOBIN A1C   08/11/2018  . URINE MICROALBUMIN  11/04/2018  . INFLUENZA VACCINE  Completed  . Hepatitis C Screening  Completed  . PNA vac Low Risk Adult  Completed       Plan:   ***  I have personally reviewed and noted the following in the patient's chart:   . Medical and social history . Use of alcohol, tobacco or illicit drugs  . Current medications and supplements . Functional ability and status . Nutritional  status . Physical activity . Advanced directives . List of other physicians . Hospitalizations, surgeries, and ER visits in previous 12 months . Vitals . Screenings to include cognitive, depression, and falls . Referrals and appointments  In addition, I have reviewed and discussed with patient certain preventive protocols, quality metrics, and best practice recommendations. A written personalized care plan for preventive services as well as general preventive health recommendations were provided to patient.     Joanne Chars, LPN   19/09/9745

## 2018-02-18 ENCOUNTER — Other Ambulatory Visit: Payer: Medicare Other

## 2018-02-23 ENCOUNTER — Ambulatory Visit: Payer: Medicare Other

## 2018-04-12 ENCOUNTER — Other Ambulatory Visit: Payer: Self-pay

## 2018-04-12 NOTE — Patient Outreach (Signed)
[facility]) Care Management  04/12/2018  Jon [name] [birth year] [id number]   Medication Adherence call to [name] spoke with patient he is due on Lisinopril 2.5 mg, [name] explain  he is no longer taking this medication the doctor took him off three month ago. [name] is showing past due under [place].   [facility] Management Direct Dial [phone]  Fax [phone] [email or web address]

## 2018-05-18 ENCOUNTER — Ambulatory Visit (INDEPENDENT_AMBULATORY_CARE_PROVIDER_SITE_OTHER): Payer: Medicare Other | Admitting: Family Medicine

## 2018-05-18 ENCOUNTER — Encounter: Payer: Self-pay | Admitting: Family Medicine

## 2018-05-18 VITALS — BP 135/76 | HR 78 | Ht 73.0 in | Wt 213.1 lb

## 2018-05-18 DIAGNOSIS — N183 Chronic kidney disease, stage 3 unspecified: Secondary | ICD-10-CM

## 2018-05-18 DIAGNOSIS — J432 Centrilobular emphysema: Secondary | ICD-10-CM

## 2018-05-18 DIAGNOSIS — Z125 Encounter for screening for malignant neoplasm of prostate: Secondary | ICD-10-CM

## 2018-05-18 DIAGNOSIS — E119 Type 2 diabetes mellitus without complications: Secondary | ICD-10-CM

## 2018-05-18 DIAGNOSIS — E1122 Type 2 diabetes mellitus with diabetic chronic kidney disease: Secondary | ICD-10-CM | POA: Diagnosis not present

## 2018-05-18 DIAGNOSIS — L84 Corns and callosities: Secondary | ICD-10-CM | POA: Diagnosis not present

## 2018-05-18 DIAGNOSIS — Z23 Encounter for immunization: Secondary | ICD-10-CM

## 2018-05-18 LAB — POCT GLYCOSYLATED HEMOGLOBIN (HGB A1C): Hemoglobin A1C: 7.6 % — AB (ref 4.0–5.6)

## 2018-05-18 LAB — POCT UA - MICROALBUMIN
Albumin/Creatinine Ratio, Urine, POC: <30
Creatinine, POC: 100 mg/dL
MICROALBUMIN (UR) POC: 10 mg/L

## 2018-05-18 MED ORDER — AMBULATORY NON FORMULARY MEDICATION: 0 refills | Status: DC

## 2018-05-18 NOTE — Progress Notes (Signed)
Subjective:    CC:   HPI:  Diabetes - no hypoglycemic events. No wounds or sores that are not healing well. No increased thirst or urination. Checking glucose at home. Taking medications as prescribed without any side effects.  F/U COPD -stable.  No recent flares or exacerbations.  He was seen back in June for acute bronchitis.  F/U CKD 3 -no recent changes.  Due to recheck renal function.  He has been having some pain over the left foot.  On the outer ball of the foot he has a chronic callused area.  In fact he actually saw podiatry for once.  But says it continues to bother him.  He does try to moisturize it.  Past medical history, Surgical history, Family history not pertinant except as noted below, Social history, Allergies, and medications have been entered into the medical record, reviewed, and corrections made.   Review of Systems: No fevers, chills, night sweats, weight loss, chest pain, or shortness of breath.   Objective:    General: Well Developed, well nourished, and in no acute distress.  Neuro: Alert and oriented x3, extra-ocular muscles intact, sensation grossly intact.  HEENT: Normocephalic, atraumatic  Skin: Warm and dry, no rashes. Cardiac: Regular rate and rhythm, no murmurs rubs or gallops, no lower extremity edema.  Respiratory: Clear to auscultation bilaterally. Not using accessory muscles, speaking in full sentences.   Impression and Recommendations:    DM -A1c uncontrolled today up to 7.6 but he is also been off of his insulin and Synjardy for well over a month at this point so not surprising.  Now that it is a new year he will start back his medications but unfortunately this will put him into his Medicare gap very quickly.  Follow-up in 3 to 4 months.  CKD 3 -stable.  Due to recheck renal function.  Emphysema -stable.  No recent flares.  Callous on the left foot.  -We discussed using some type of foot file or pumice stone in addition to moisturizing.   Recommend a trial of Cereve lotion which is a keratolytic.  In addition we discussed using metatarsal pads to try to change the mechanics of the foot to take pressure off of the area.  Explained to him that without taking the friction away from that area to of his foot when he walks it will never completely go away.  If this is not helpful then consider return back to podiatry for further treatment and possible customized insoles.  Given metatarsal pads, size large and showed him how to place them in his shoes properly.

## 2018-05-18 NOTE — Patient Instructions (Signed)
Can try Cerave lotion for you callous on your foot.

## 2018-05-19 ENCOUNTER — Telehealth: Payer: Self-pay

## 2018-05-19 LAB — LIPID PANEL
Cholesterol: 159 mg/dL (ref <200)
HDL: 45 mg/dL (ref >40)
LDL Cholesterol (Calc): 85 mg/dL (calc)
Non-HDL Cholesterol (Calc): 114 mg/dL (calc) (ref <130)
Total CHOL/HDL Ratio: 3.5 (calc) (ref <5.0)
Triglycerides: 191 mg/dL — ABNORMAL HIGH (ref <150)

## 2018-05-19 LAB — COMPLETE METABOLIC PANEL WITH GFR
AG Ratio: 2 (calc) (ref 1.0–2.5)
ALT: 18 U/L (ref 9–46)
AST: 13 U/L (ref 10–35)
Albumin: 4.5 g/dL (ref 3.6–5.1)
Alkaline phosphatase (APISO): 70 U/L (ref 40–115)
BUN/Creatinine Ratio: 9 (calc) (ref 6–22)
BUN: 11 mg/dL (ref 7–25)
CO2: 26 mmol/L (ref 20–32)
Calcium: 9.4 mg/dL (ref 8.6–10.3)
Chloride: 105 mmol/L (ref 98–110)
Creat: 1.19 mg/dL — ABNORMAL HIGH (ref 0.70–1.18)
GFR, Est African American: 70 mL/min/{1.73_m2} (ref >=60)
GFR, Est Non African American: 61 mL/min/{1.73_m2} (ref >=60)
Globulin: 2.2 g/dL (calc) (ref 1.9–3.7)
Glucose, Bld: 164 mg/dL — ABNORMAL HIGH (ref 65–99)
Potassium: 5 mmol/L (ref 3.5–5.3)
Sodium: 138 mmol/L (ref 135–146)
Total Bilirubin: 0.4 mg/dL (ref 0.2–1.2)
Total Protein: 6.7 g/dL (ref 6.1–8.1)

## 2018-05-19 LAB — PSA: PSA: 0.2 ng/mL (ref <=4.0)

## 2018-05-19 NOTE — Telephone Encounter (Signed)
Jon [name] called and states he has been having a lot of nightmares and would like to take Trazosine to help with the nightmares. Please advise.

## 2018-05-20 MED ORDER — TRAZODONE HCL 50 MG PO TABS: 25.0000 mg | ORAL_TABLET | Freq: Every evening | ORAL | 3 refills | Status: DC | PRN

## 2018-05-20 NOTE — Telephone Encounter (Signed)
Patient advised.

## 2018-05-20 NOTE — Telephone Encounter (Signed)
Okay, new prescription sent to [facility] so that he can try it.  Start with half a tab and then if needs to go up to a whole tab that is fine.  Okay to increase to 2 but do not take more than that without calling us.  If he decides it is helpful and needs it sent to his mail order than he can let us know.

## 2018-05-28 ENCOUNTER — Other Ambulatory Visit: Payer: Self-pay

## 2018-05-28 MED ORDER — METFORMIN HCL 1000 MG PO TABS: 1000.0000 mg | ORAL_TABLET | Freq: Two times a day (BID) | ORAL | 1 refills | Status: DC

## 2018-06-14 DIAGNOSIS — E119 Type 2 diabetes mellitus without complications: Secondary | ICD-10-CM | POA: Diagnosis not present

## 2018-06-14 DIAGNOSIS — H5203 Hypermetropia, bilateral: Secondary | ICD-10-CM | POA: Diagnosis not present

## 2018-06-19 ENCOUNTER — Other Ambulatory Visit: Payer: Self-pay | Admitting: Family Medicine

## 2018-06-24 DIAGNOSIS — E119 Type 2 diabetes mellitus without complications: Secondary | ICD-10-CM | POA: Diagnosis not present

## 2018-06-24 DIAGNOSIS — M779 Enthesopathy, unspecified: Secondary | ICD-10-CM | POA: Diagnosis not present

## 2018-06-24 DIAGNOSIS — Q6689 Other  specified congenital deformities of feet: Secondary | ICD-10-CM | POA: Diagnosis not present

## 2018-06-30 ENCOUNTER — Ambulatory Visit (INDEPENDENT_AMBULATORY_CARE_PROVIDER_SITE_OTHER): Payer: Medicare Other | Admitting: Family Medicine

## 2018-06-30 ENCOUNTER — Encounter: Payer: Self-pay | Admitting: Family Medicine

## 2018-06-30 VITALS — BP 137/80 | HR 77 | Ht 73.0 in | Wt 217.0 lb

## 2018-06-30 DIAGNOSIS — N644 Mastodynia: Secondary | ICD-10-CM

## 2018-06-30 DIAGNOSIS — E1142 Type 2 diabetes mellitus with diabetic polyneuropathy: Secondary | ICD-10-CM

## 2018-06-30 NOTE — Progress Notes (Signed)
Acute Office Visit  Subjective:    Patient ID: Jon [name], male    DOB: 26-Mar-1946, 73 y.o.   MRN: [id number]  Chief Complaint  Patient presents with  . sore nipple    R nipple he reports that this has been this way for about 6 mos denies pain, discharge, or swelling, he said that the area is just sore to touch.     HPI Patient is in today for R nipple he reports that this has been this way for about 6 - 12 mos denies pain, discharge, or swelling, he said that the area is just sore to touch.  No mass or lesion.   O/W not bothersome. No heat. No fever.  No med changes recently. No major change in acitivity level.    Past Medical History:  Diagnosis Date  . BENIGN POSITIONAL VERTIGO 05/20/2010   Qualifier: Diagnosis of  By: Madilyn Fireman MD, Barnetta Chapel    . Diabetes mellitus   . Epididymitis   . Hyperlipemia   . Hypertension   . Stroke [facility]) 7/11    Past Surgical History:  Procedure Laterality Date  . PILONIDAL CYST EXCISION  1960, 61, 62, 63  . VASECTOMY      Family History  Problem Relation Age of Onset  . Diabetes Other        family hx of  . Colon cancer Neg Hx     Social History   Socioeconomic History  . Marital status: Divorced    Spouse name: Not on file  . Number of children: 2  . Years of education: Not on file  . Highest education level: Not on file  Occupational History  . Occupation: Retired  Scientific laboratory technician  . Financial resource strain: Not on file  . Food insecurity:    Worry: Not on file    Inability: Not on file  . Transportation needs:    Medical: Not on file    Non-medical: Not on file  Tobacco Use  . Smoking status: Current Every Day Smoker    Packs/day: 2.00    Years: 58.00    Pack years: 116.00    Types: Cigarettes  . Smokeless tobacco: Never Used  . Tobacco comment: Counseling sheet given in exam room 07-2011   Substance and Sexual Activity  . Alcohol use: No  . Drug use: No  . Sexual activity: Not on file  Lifestyle  . Physical  activity:    Days per week: Not on file    Minutes per session: Not on file  . Stress: Not on file  Relationships  . Social connections:    Talks on phone: Not on file    Gets together: Not on file    Attends religious service: Not on file    Active member of club or organization: Not on file    Attends meetings of clubs or organizations: Not on file    Relationship status: Not on file  . Intimate partner violence:    Fear of current or ex partner: Not on file    Emotionally abused: Not on file    Physically abused: Not on file    Forced sexual activity: Not on file  Other Topics Concern  . Not on file  Social History Narrative   Daily caffeine     Outpatient Medications Prior to Visit  Medication Sig Dispense Refill  . AMBULATORY NON FORMULARY MEDICATION Medication Name: Tdap x 1IM 1 vial 0  . Blood Glucose Monitoring Suppl (ACCU-CHEK  AVIVA PLUS) w/Device KIT Check fasting blood sugar daily and before meals. DM Diagnosis 1 kit prn  . clopidogrel (PLAVIX) 75 MG tablet TAKE 1 TABLET BY MOUTH  DAILY 90 tablet 1  . Empagliflozin-metFORMIN HCl 12.5-500 MG TABS Take 1 tablet by mouth 2 (two) times daily.    . fish oil-omega-3 fatty acids 1000 MG capsule Take 4 g by mouth daily.    [name] glucose blood (ACCU-CHEK AVIVA PLUS) test strip USE ONE STRIP TO CHECK BLOOD SUGAR TWICE DAILY AS DIRECTED. Dx: E11.9 200 each 6  . Insulin Pen Needle (B-D ULTRAFINE III SHORT PEN) 31G X 8 MM MISC INJECT INTO SKIN DAILY. 100 each 1  . ketoconazole (NIZORAL) 2 % cream APPLY CREAM TOPICALLY TO AFFECTED AREA ONCE DAILY FOR 30 DAYS  2  . LEVEMIR FLEXTOUCH 100 UNIT/ML Pen INJECT 10-20 UNITS INTO THE SKIN DAILY AT 10 PM. 30 mL 3  . lisinopril (PRINIVIL,ZESTRIL) 2.5 MG tablet Take 1 tablet by mouth daily.    . metFORMIN (GLUCOPHAGE) 1000 MG tablet Take 1 tablet (1,000 mg total) by mouth 2 (two) times daily with a meal. 180 tablet 1  . simvastatin (ZOCOR) 40 MG tablet TAKE 1 TABLET BY MOUTH AT  BEDTIME 90 tablet 3   . traZODone (DESYREL) 50 MG tablet Take 0.5-1 tablets (25-50 mg total) by mouth at bedtime as needed for sleep. 30 tablet 3   No facility-administered medications prior to visit.     Allergies  Allergen Reactions  . Gabapentin Other (See Comments)    Nightmares.   . Metformin And Related Diarrhea    ROS     Objective:    Physical Exam  Constitutional: He is oriented to person, place, and time. He appears well-developed and well-nourished.  HENT:  Head: Normocephalic and atraumatic.  Eyes: Conjunctivae and EOM are normal.  Cardiovascular: Normal rate.  Pulmonary/Chest: Effort normal.  Neurological: He is alert and oriented to person, place, and time.  Skin: Skin is dry. No pallor.  Nontender chest wall. No mas or lesion under the niplle or in the breast area.  No axillary lymphadenopathy.  No redness, rash or scaling of the nipple. No discharge.    Psychiatric: He has a normal mood and affect. His behavior is normal.  Vitals reviewed.   BP 137/80   Pulse 77   Ht '6\' 1"'$  (1.854 m)   Wt 217 lb (98.4 kg)   SpO2 99%   BMI 28.63 kg/m  Wt Readings from Last 3 Encounters:  06/30/18 217 lb (98.4 kg)  05/18/18 213 lb 1.6 oz (96.7 kg)  02/09/18 212 lb (96.2 kg)    There are no preventive care reminders to display for this patient.  There are no preventive care reminders to display for this patient.   Lab Results  Component Value Date   TSH 3.953 12/27/2013   Lab Results  Component Value Date   WBC 7.6 03/21/2015   HGB 16.3 03/21/2015   HCT 46.5 03/21/2015   MCV 94.5 03/21/2015   PLT 182 03/21/2015   Lab Results  Component Value Date   NA 138 05/18/2018   K 5.0 05/18/2018   CO2 26 05/18/2018   GLUCOSE 164 (H) 05/18/2018   BUN 11 05/18/2018   CREATININE 1.19 (H) 05/18/2018   BILITOT 0.4 05/18/2018   ALKPHOS 63 12/25/2015   AST 13 05/18/2018   ALT 18 05/18/2018   PROT 6.7 05/18/2018   ALBUMIN 4.4 12/25/2015   CALCIUM 9.4 05/18/2018   Lab Results  Component Value Date   CHOL 159 05/18/2018   Lab Results  Component Value Date   HDL 45 05/18/2018   Lab Results  Component Value Date   LDLCALC 85 05/18/2018   Lab Results  Component Value Date   TRIG 191 (H) 05/18/2018   Lab Results  Component Value Date   CHOLHDL 3.5 05/18/2018   Lab Results  Component Value Date   HGBA1C 7.6 (A) 05/18/2018       Assessment & Plan:   Problem List Items Addressed This Visit      Endocrine   Diabetic peripheral neuropathy (HCC)   Relevant Medications   lisinopril (PRINIVIL,ZESTRIL) 2.5 MG tablet   Other Relevant Orders   TSH   CBC    Other Visit Diagnoses    Nipple pain    -  Primary   Relevant Orders   TSH   CBC   US BREAST COMPLETE UNI RIGHT INC AXILLA   MM Digital Diagnostic Unilat R     Nipple pain-unclear etiology it is just the right nipple.  It is been persistent daily though for months which is a little unusual.  I do not palpate any mass or discrete lesions but I do think further work-up with ultrasound and imaging is warranted because it has been persistent.  There is no sign of redness irritation rash or chafing.  No sign of infection such as mastitis.  Will check a CBC though.  We will also check a TSH.   No orders of the defined types were placed in this encounter.    Beatrice Lecher, MD

## 2018-07-01 LAB — CBC
HCT: 43.2 % (ref 38.5–50.0)
Hemoglobin: 15.2 g/dL (ref 13.2–17.1)
MCH: 33.3 pg — ABNORMAL HIGH (ref 27.0–33.0)
MCHC: 35.2 g/dL (ref 32.0–36.0)
MCV: 94.7 fL (ref 80.0–100.0)
MPV: 11.3 fL (ref 7.5–12.5)
Platelets: 183 10*3/uL (ref 140–400)
RBC: 4.56 10*6/uL (ref 4.20–5.80)
RDW: 11.7 % (ref 11.0–15.0)
WBC: 5.9 10*3/uL (ref 3.8–10.8)

## 2018-07-01 LAB — TSH: TSH: 4.28 mIU/L (ref 0.40–4.50)

## 2018-07-05 ENCOUNTER — Telehealth: Payer: Self-pay

## 2018-07-05 DIAGNOSIS — N644 Mastodynia: Secondary | ICD-10-CM

## 2018-07-05 NOTE — Telephone Encounter (Signed)
Jon [name] called and asked about the mammogram. I called [place] and they directed me to scheduling. The scheduling department stated the order has to be changed. Changed the order. Gave patient the appointment time for [facility] Thursday the 27 th at 10:45.

## 2018-07-08 ENCOUNTER — Ambulatory Visit
Admission: RE | Admit: 2018-07-08 | Discharge: 2018-07-08 | Disposition: A | Discharge status: Home / Self Care | Payer: Medicare Other | Source: Ambulatory Visit | Attending: Family Medicine | Admitting: Family Medicine

## 2018-07-08 DIAGNOSIS — N644 Mastodynia: Secondary | ICD-10-CM | POA: Insufficient documentation

## 2018-07-13 ENCOUNTER — Ambulatory Visit
Admission: RE | Admit: 2018-07-13 | Discharge: 2018-07-13 | Disposition: A | Discharge status: Home / Self Care | Payer: Medicare Other | Source: Ambulatory Visit | Attending: Family Medicine | Admitting: Family Medicine

## 2018-07-13 ENCOUNTER — Ambulatory Visit: Admission: RE | Admit: 2018-07-13 | Payer: Medicare Other | Source: Ambulatory Visit

## 2018-07-13 DIAGNOSIS — N644 Mastodynia: Secondary | ICD-10-CM | POA: Diagnosis not present

## 2018-07-13 DIAGNOSIS — R928 Other abnormal and inconclusive findings on diagnostic imaging of breast: Secondary | ICD-10-CM | POA: Diagnosis not present

## 2018-07-15 ENCOUNTER — Encounter: Payer: Self-pay | Admitting: Family Medicine

## 2018-07-15 ENCOUNTER — Ambulatory Visit (INDEPENDENT_AMBULATORY_CARE_PROVIDER_SITE_OTHER): Payer: Medicare Other | Admitting: Family Medicine

## 2018-07-15 VITALS — BP 118/72 | HR 64 | Ht 73.0 in | Wt 215.0 lb

## 2018-07-15 DIAGNOSIS — Z7689 Persons encountering health services in other specified circumstances: Secondary | ICD-10-CM

## 2018-07-15 DIAGNOSIS — I679 Cerebrovascular disease, unspecified: Secondary | ICD-10-CM

## 2018-07-15 DIAGNOSIS — I7090 Unspecified atherosclerosis: Secondary | ICD-10-CM | POA: Diagnosis not present

## 2018-07-15 DIAGNOSIS — Z8679 Personal history of other diseases of the circulatory system: Secondary | ICD-10-CM

## 2018-07-15 DIAGNOSIS — E119 Type 2 diabetes mellitus without complications: Secondary | ICD-10-CM | POA: Diagnosis not present

## 2018-07-15 DIAGNOSIS — J432 Centrilobular emphysema: Secondary | ICD-10-CM | POA: Diagnosis not present

## 2018-07-15 DIAGNOSIS — F17200 Nicotine dependence, unspecified, uncomplicated: Secondary | ICD-10-CM

## 2018-07-15 DIAGNOSIS — E782 Mixed hyperlipidemia: Secondary | ICD-10-CM

## 2018-07-15 DIAGNOSIS — R911 Solitary pulmonary nodule: Secondary | ICD-10-CM

## 2018-07-15 DIAGNOSIS — I10 Essential (primary) hypertension: Secondary | ICD-10-CM

## 2018-07-15 HISTORY — DX: Cerebrovascular disease, unspecified: I67.9

## 2018-07-15 MED ORDER — INSULIN PEN NEEDLE 31G X 8 MM MISC: 3 refills | Status: DC

## 2018-07-15 MED ORDER — CLOPIDOGREL BISULFATE 75 MG PO TABS: 75.0000 mg | ORAL_TABLET | Freq: Every day | ORAL | 1 refills | Status: DC

## 2018-07-15 NOTE — Progress Notes (Signed)
Date:  07/15/2018   Name:  Jon [name]   DOB:  [birth year]   MRN:  [id number]   Chief Complaint: Establish Care  Patient is a 73 year old male who presents for a comprehensive physical exam. The patient reports the following problems: diabetes.htn,hyperlipid/cerevasc . Health maintenance has been reviewed up to date.   Review of Systems  Constitutional: Negative for chills and fever.  HENT: Negative for drooling, ear discharge, ear pain and sore throat.   Respiratory: Negative for cough, shortness of breath and wheezing.   Cardiovascular: Negative for chest pain, palpitations and leg swelling.  Gastrointestinal: Negative for abdominal pain, blood in stool, constipation, diarrhea and nausea.  Endocrine: Negative for polydipsia.  Genitourinary: Negative for dysuria, frequency, hematuria and urgency.  Musculoskeletal: Negative for back pain, myalgias and neck pain.  Skin: Negative for rash.  Allergic/Immunologic: Negative for environmental allergies.  Neurological: Negative for dizziness and headaches.  Hematological: Does not bruise/bleed easily.  Psychiatric/Behavioral: Negative for suicidal ideas. The patient is not nervous/anxious.     Patient Active Problem List   Diagnosis Date Noted  . History of hypertension 12/02/2017  . Atherosclerotic vascular disease 12/02/2017  . Continuous dependence on cigarette smoking 12/02/2017  . History of stroke 12/02/2017  . Shortness of breath on exertion 12/02/2017  . Centrilobular emphysema ([place]) 11/03/2017  . 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 ([place]) 07/30/2016  . CKD stage 3 due to type 2 diabetes mellitus ([place]) 03/22/2015  . Screening for prostate cancer 07/22/2011  . Diabetic peripheral neuropathy ([place]) 06/05/2011  . ROTATOR CUFF SYNDROME, RIGHT 03/22/2010  . History of cardiovascular disorder 02/28/2010  . DM (diabetes mellitus) ([place]) 01/31/2010  . Hyperlipidemia  01/31/2010  . TOBACCO ABUSE 01/31/2010    Allergies  Allergen Reactions  . Gabapentin Other (See Comments)    Nightmares.     Past Surgical History:  Procedure Laterality Date  . PILONIDAL CYST EXCISION  1960, 61, 62, 63  . VASECTOMY      Social History   Tobacco Use  . Smoking status: Current Every Day Smoker    Packs/day: 2.00    Years: 58.00    Pack years: 116.00    Types: Cigarettes  . Smokeless tobacco: Never Used  . Tobacco comment: Counseling sheet given in exam room 07-2011   Substance Use Topics  . Alcohol use: No  . Drug use: No     Medication list has been reviewed and updated.  Current Meds  Medication Sig  . Blood Glucose Monitoring Suppl (ACCU-CHEK AVIVA PLUS) w/Device KIT Check fasting blood sugar daily and before meals. DM Diagnosis  . clopidogrel (PLAVIX) 75 MG tablet TAKE 1 TABLET BY MOUTH  DAILY (Patient taking differently: cardiology)  . fish oil-omega-3 fatty acids 1000 MG capsule Take 4 g by mouth daily.  [name] glucose blood (ACCU-CHEK AVIVA PLUS) test strip USE ONE STRIP TO CHECK BLOOD SUGAR TWICE DAILY AS DIRECTED. Dx: E11.9  . Insulin Pen Needle (B-D ULTRAFINE III SHORT PEN) 31G X 8 MM MISC INJECT INTO SKIN DAILY.  [name] ketoconazole (NIZORAL) 2 % cream APPLY CREAM TOPICALLY TO AFFECTED AREA ONCE DAILY FOR 30 DAYS  . LEVEMIR FLEXTOUCH 100 UNIT/ML Pen INJECT 10-20 UNITS INTO THE SKIN DAILY AT 10 PM. (Patient taking differently: Inject 26 Units into the skin at bedtime. )  . lisinopril (PRINIVIL,ZESTRIL) 2.5 MG tablet Take 1 tablet by mouth daily.  . metFORMIN (GLUCOPHAGE) 1000 MG tablet Take 1 tablet (1,000 mg  total) by mouth 2 (two) times daily with a meal. (Patient taking differently: Take 1,000 mg by mouth daily with breakfast. )  . simvastatin (ZOCOR) 40 MG tablet TAKE 1 TABLET BY MOUTH AT  BEDTIME  . traZODone (DESYREL) 50 MG tablet Take 0.5-1 tablets (25-50 mg total) by mouth at bedtime as needed for sleep.    PHQ 2/9 Scores 07/15/2018 05/18/2018  02/09/2018 01/27/2017  PHQ - 2 Score 0 0 0 0  PHQ- 9 Score 0 - - -    Physical Exam Vitals signs and nursing note reviewed.  HENT:     Head: Normocephalic.     Right Ear: External ear normal.     Left Ear: External ear normal.     Nose: Nose normal.  Eyes:     General: No scleral icterus.       Right eye: No discharge.        Left eye: No discharge.     Conjunctiva/sclera: Conjunctivae normal.     Pupils: Pupils are equal, round, and reactive to light.  Neck:     Musculoskeletal: Normal range of motion and neck supple.     Thyroid: No thyromegaly.     Vascular: No JVD.     Trachea: No tracheal deviation.  Cardiovascular:     Rate and Rhythm: Normal rate and regular rhythm.     Heart sounds: Normal heart sounds. No murmur. No friction rub. No gallop.   Pulmonary:     Effort: No respiratory distress.     Breath sounds: Normal breath sounds. No wheezing or rales.  Abdominal:     General: Bowel sounds are normal.     Palpations: Abdomen is soft. There is no mass.     Tenderness: There is no abdominal tenderness. There is no guarding or rebound.  Musculoskeletal: Normal range of motion.        General: No tenderness.  Lymphadenopathy:     Cervical: No cervical adenopathy.  Skin:    General: Skin is warm.     Findings: No rash.  Neurological:     Mental Status: He is alert and oriented to person, place, and time.     Cranial Nerves: No cranial nerve deficit.     Deep Tendon Reflexes: Reflexes are normal and symmetric.     BP 118/72   Pulse 64   Ht _0  (1.854 m)   Wt 215 lb (97.5 kg)   BMI 28.37 kg/m   Assessment and Plan: 1. Establishing care with new doctor, encounter for Patient is a interesting new patient with multiple medical concerns.  Patient has had Dr. Charise Carwin in the past and notes are well documented on review as well as recent imaging and labs as well.  Also reviewed his last DEXA scan which was on remarkable except for some three-vessel coronary artery  disease that was noted on CT scan of the chest.  2. Diabetes mellitus without complication ([place]) Patient has a history of diabetes for which he takes Levemir and metformin.  We will refill his pen needles at this time - Insulin Pen Needle (B-D ULTRAFINE III SHORT PEN) 31G X 8 MM MISC; INJECT INTO SKIN DAILY.  Dispense: 100 each; Refill: 3  3. Cerebrovascular disease Patient has had a history of a CVA for which he was put on Plavix in the past by neurologist.  4. Atherosclerotic vascular disease Incidental atherosclerotic vascular disease was noted on CT of the chest for which he takes Plavix 75 mg once a  day.  5. Centrilobular emphysema ([place]) Noted on CT scan of the chest that patient has centrilobular emphysema.  Caution smoking is encouraged patient is unlikely to do so.  6. Mixed hyperlipidemia Patient has a history of mixed hyperlipidemia for which he takes omega-3 and simvastatin 40 mg once a day.  7. TOBACCO ABUSE Patient has been advised of the health risks of smoking and counseled concerning cessation of tobacco products. I spent over 3 minutes for discussion and to answer questions.  8. HYPERTENSION, MILD On review of the notes patient was noted to have resolution of his high blood pressure for which he is currently in his sodium intake.  He is on lisinopril for action of kidneys from diabetic nephropathy which I assume is probably helping his blood pressure as well  9. Pulmonary nodule Incidentally was noted a pulmonary nodule on CT of the chest this was done in 5/19 with a repeat of this to be scheduled in May of this year.  10. History of cardiovascular disorder Three-vessel coronary artery disease was noted on CT patient is currently taking Plavix 75 mg daily.

## 2018-08-11 ENCOUNTER — Other Ambulatory Visit: Payer: Self-pay | Admitting: *Deleted

## 2018-08-11 MED ORDER — TRAZODONE HCL 50 MG PO TABS: 25.0000 mg | ORAL_TABLET | Freq: Every evening | ORAL | 1 refills | Status: DC | PRN

## 2018-08-17 ENCOUNTER — Ambulatory Visit: Payer: Medicare Other | Admitting: Family Medicine

## 2018-08-24 ENCOUNTER — Other Ambulatory Visit: Payer: Self-pay

## 2018-08-24 MED ORDER — LISINOPRIL 2.5 MG PO TABS: 2.5000 mg | ORAL_TABLET | Freq: Every day | ORAL | 1 refills | Status: DC

## 2018-10-01 ENCOUNTER — Encounter: Payer: Self-pay | Admitting: Family Medicine

## 2018-10-01 ENCOUNTER — Other Ambulatory Visit: Payer: Medicare Other

## 2018-10-01 ENCOUNTER — Ambulatory Visit (INDEPENDENT_AMBULATORY_CARE_PROVIDER_SITE_OTHER): Payer: Medicare Other | Admitting: Family Medicine

## 2018-10-01 VITALS — BP 95/48 | HR 47 | Ht 73.0 in | Wt 216.0 lb

## 2018-10-01 DIAGNOSIS — J432 Centrilobular emphysema: Secondary | ICD-10-CM

## 2018-10-01 DIAGNOSIS — I952 Hypotension due to drugs: Secondary | ICD-10-CM | POA: Diagnosis not present

## 2018-10-01 DIAGNOSIS — G4700 Insomnia, unspecified: Secondary | ICD-10-CM

## 2018-10-01 DIAGNOSIS — N183 Chronic kidney disease, stage 3 (moderate): Secondary | ICD-10-CM

## 2018-10-01 DIAGNOSIS — E1122 Type 2 diabetes mellitus with diabetic chronic kidney disease: Secondary | ICD-10-CM

## 2018-10-01 DIAGNOSIS — E119 Type 2 diabetes mellitus without complications: Secondary | ICD-10-CM | POA: Diagnosis not present

## 2018-10-01 LAB — POCT GLYCOSYLATED HEMOGLOBIN (HGB A1C): Hemoglobin A1C: 7.2 % — AB (ref 4.0–5.6)

## 2018-10-01 LAB — GLUCOSE, POCT (MANUAL RESULT ENTRY): POC Glucose: 145 mg/dl — AB (ref 70–99)

## 2018-10-01 NOTE — Progress Notes (Signed)
Established Patient Office Visit  Subjective:  Patient ID: Jon [name], male    DOB: 1946-02-21  Age: 73 y.o. MRN: [id number]  CC:  Chief Complaint  Patient presents with  . Diabetes    HPI Jon [name] presents for   Diabetes - no hypoglycemic events. No wounds or sores that are not healing well. No increased thirst or urination. Checking glucose at home. Taking medications as prescribed without any side effects. 26 units of Levemir.  2075m of metformin though gives him diarrhea.    Also wanted to talk about his blood pressure today.  A year or 2 ago we actually put him on a blood pressure medication and his blood pressure actually dropped a little too low.  He was seen the cardiologist at the time of systolic pressure was in the 90s and he was told to discontinue it.  About 6 to 8 months ago we decided to put him on a real low dose of 2.5 mg of lisinopril more for renal protection because of his diabetes.  He says he was doing well until about a few weeks ago.  He said he started to notice that he was feeling lightheaded at different times.  In particular last weekend without coughing he says he got to about the eighth hole and with bending over to pick up the golf ball he felt like he was actually going to pass out.  Everything looked black on the periphery and colors looked slightly brighter.  He said it lasted for about 3 seconds and then he felt better.  So on Monday he says he actually stopped his blood pressure pill.  He denies any recent stressors but says over the last several months he has been extremely sedentary.  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  He says he only drinks about 36 ounces of fluid a day.  1 to give me an update and let me know that since starting the trazodone for sleep his nightmares have completely resolved so he is been very  happy with that and denies any excess sedation on the medication.  COPD-he has been stable with no recent flares or exacerbations.  He wanted to know if it would be safe to start walking for exercise.   Past Medical History:  Diagnosis Date  . BENIGN POSITIONAL VERTIGO 05/20/2010   Qualifier: Diagnosis of  By: MMadilyn FiremanMD, CBarnetta Chapel   . Diabetes mellitus   . Epididymitis   . Hyperlipemia   . Hypertension   . Stroke ([facility] 7/11    Past Surgical History:  Procedure Laterality Date  . PILONIDAL CYST EXCISION  1960, 61, 62, 63  . VASECTOMY      Family History  Problem Relation Age of Onset  . Diabetes Other        family hx of  . Colon cancer Neg Hx     Social History   Socioeconomic History  . Marital status: Divorced    Spouse name: Not on file  . Number of children: 2  . Years of education: Not on file  . Highest education level: Not on file  Occupational History  . Occupation: Retired  SScientific laboratory technician . Financial resource strain: Not on file  . Food insecurity:    Worry: Not on file    Inability: Not on file  .  Transportation needs:    Medical: Not on file    Non-medical: Not on file  Tobacco Use  . Smoking status: Current Every Day Smoker    Packs/day: 2.00    Years: 58.00    Pack years: 116.00    Types: Cigarettes  . Smokeless tobacco: Never Used  . Tobacco comment: Counseling sheet given in exam room 07-2011   Substance and Sexual Activity  . Alcohol use: No  . Drug use: No  . Sexual activity: Not on file  Lifestyle  . Physical activity:    Days per week: Not on file    Minutes per session: Not on file  . Stress: Not on file  Relationships  . Social connections:    Talks on phone: Not on file    Gets together: Not on file    Attends religious service: Not on file    Active member of club or organization: Not on file    Attends meetings of clubs or organizations: Not on file    Relationship status: Not on file  . Intimate partner violence:    Fear  of current or ex partner: Not on file    Emotionally abused: Not on file    Physically abused: Not on file    Forced sexual activity: Not on file  Other Topics Concern  . Not on file  Social History Narrative   Daily caffeine     Outpatient Medications Prior to Visit  Medication Sig Dispense Refill  . Blood Glucose Monitoring Suppl (ACCU-CHEK AVIVA PLUS) w/Device KIT Check fasting blood sugar daily and before meals. DM Diagnosis 1 kit prn  . clopidogrel (PLAVIX) 75 MG tablet Take 1 tablet (75 mg total) by mouth daily. 90 tablet 1  . fish oil-omega-3 fatty acids 1000 MG capsule Take 4 g by mouth daily.    [name] glucose blood (ACCU-CHEK AVIVA PLUS) test strip USE ONE STRIP TO CHECK BLOOD SUGAR TWICE DAILY AS DIRECTED. Dx: E11.9 200 each 6  . Insulin Pen Needle (B-D ULTRAFINE III SHORT PEN) 31G X 8 MM MISC INJECT INTO SKIN DAILY. 100 each 3  . ketoconazole (NIZORAL) 2 % cream APPLY CREAM TOPICALLY TO AFFECTED AREA ONCE DAILY FOR 30 DAYS  2  . LEVEMIR FLEXTOUCH 100 UNIT/ML Pen INJECT 10-20 UNITS INTO THE SKIN DAILY AT 10 PM. (Patient taking differently: Inject 26 Units into the skin at bedtime. ) 30 mL 3  . metFORMIN (GLUCOPHAGE) 1000 MG tablet Take 1 tablet (1,000 mg total) by mouth 2 (two) times daily with a meal. 180 tablet 1  . simvastatin (ZOCOR) 40 MG tablet TAKE 1 TABLET BY MOUTH AT  BEDTIME 90 tablet 3  . traZODone (DESYREL) 50 MG tablet Take 0.5-1 tablets (25-50 mg total) by mouth at bedtime as needed for sleep. 90 tablet 1  . lisinopril (PRINIVIL,ZESTRIL) 2.5 MG tablet Take 1 tablet (2.5 mg total) by mouth daily. 90 tablet 1   No facility-administered medications prior to visit.     Allergies  Allergen Reactions  . Gabapentin Other (See Comments)    Nightmares.   . Lisinopril Other (See Comments)    Hypotension    ROS Review of Systems    Objective:    Physical Exam  Constitutional: He is oriented to person, place, and time. He appears well-developed and well-nourished.   HENT:  Head: Normocephalic and atraumatic.  Neck: Neck supple. No thyromegaly present.  Cardiovascular: Normal rate, regular rhythm and normal heart sounds.  Carotid bruits.  Pulmonary/Chest: Effort  normal and breath sounds normal.  Lymphadenopathy:    He has no cervical adenopathy.  Neurological: He is alert and oriented to person, place, and time.  Skin: Skin is warm and dry.  Psychiatric: He has a normal mood and affect. His behavior is normal.    BP (!) 95/48   Pulse (!) 47   Ht _0  (1.854 m)   Wt 216 lb (98 kg)   SpO2 97%   BMI 28.50 kg/m  Wt Readings from Last 3 Encounters:  10/01/18 216 lb (98 kg)  07/15/18 215 lb (97.5 kg)  06/30/18 217 lb (98.4 kg)     There are no preventive care reminders to display for this patient.  There are no preventive care reminders to display for this patient.  Lab Results  Component Value Date   TSH 4.28 06/30/2018   Lab Results  Component Value Date   WBC 5.9 06/30/2018   HGB 15.2 06/30/2018   HCT 43.2 06/30/2018   MCV 94.7 06/30/2018   PLT 183 06/30/2018   Lab Results  Component Value Date   NA 138 05/18/2018   K 5.0 05/18/2018   CO2 26 05/18/2018   GLUCOSE 164 (H) 05/18/2018   BUN 11 05/18/2018   CREATININE 1.19 (H) 05/18/2018   BILITOT 0.4 05/18/2018   ALKPHOS 63 12/25/2015   AST 13 05/18/2018   ALT 18 05/18/2018   PROT 6.7 05/18/2018   ALBUMIN 4.4 12/25/2015   CALCIUM 9.4 05/18/2018   Lab Results  Component Value Date   CHOL 159 05/18/2018   Lab Results  Component Value Date   HDL 45 05/18/2018   Lab Results  Component Value Date   LDLCALC 85 05/18/2018   Lab Results  Component Value Date   TRIG 191 (H) 05/18/2018   Lab Results  Component Value Date   CHOLHDL 3.5 05/18/2018   Lab Results  Component Value Date   HGBA1C 7.2 (A) 10/01/2018      Assessment & Plan:   Problem List Items Addressed This Visit      Respiratory   Centrilobular emphysema ([place])    I encouraged him to get out  and start walking daily and gradually work on increasing the distance that he is able to walk.  He just wanted to make sure that it was safe to do so.        Endocrine   DM (diabetes mellitus) ([place]) - Primary    Foot exam performed today. He has been sedentary.  A1c has actually improved even though he is been very sedentary and admits his diet has not been great.  It is down to 7.2 which is fantastic.  I think if he were to start exercising again which it sounds like he has been at least trying to get out golf over the last week and gets moving then it should improve significantly we can hopefully get that A1c back under 7.0.  Encouraged him to get out and walk daily and work on gradually increasing his stamina.      Relevant Orders   POCT glycosylated hemoglobin (Hb A1C) (Completed)   POCT glucose (manual entry) (Completed)   CKD stage 3 due to type 2 diabetes mellitus ([place])    Recheck renal function.  He will go to the lab when he is able.        Other   Insomnia    Other Visit Diagnoses    Hypotension due to drugs  Hypertension secondary to lisinopril.  Discontinue medication.  We discussed that the cons outweigh the benefits at this point and so it is better for him to just be off of it.  Encouraged him to increase his fluid intake to about 60 ounces per day.  Insomnia-continue with trazodone for now.  It did improve his nightmares.  No orders of the defined types were placed in this encounter.   Follow-up: Return in about 3 months (around 01/01/2019) for spirometry and diabetes .    Beatrice Lecher, MD

## 2018-10-01 NOTE — Assessment & Plan Note (Addendum)
Foot exam performed today. He has been sedentary.  A1c has actually improved even though he is been very sedentary and admits his diet has not been great.  It is down to 7.2 which is fantastic.  I think if he were to start exercising again which it sounds like he has been at least trying to get out golf over the last week and gets moving then it should improve significantly we can hopefully get that A1c back under 7.0.  Encouraged him to get out and walk daily and work on gradually increasing his stamina.

## 2018-10-01 NOTE — Assessment & Plan Note (Signed)
I encouraged him to get out and start walking daily and gradually work on increasing the distance that he is able to walk.  He just wanted to make sure that it was safe to do so.

## 2018-10-01 NOTE — Assessment & Plan Note (Signed)
Recheck renal function.  He will go to the lab when he is able.

## 2018-10-14 ENCOUNTER — Telehealth: Payer: Self-pay

## 2018-10-14 NOTE — Telephone Encounter (Signed)
Jon [name] called and states he has had two episodes of dizziness while playing golf. His blood pressure during this time was 72/52. After he gets home his blood pressure goes back to normal. 128/88, 125/87. He states he hasn't really increased his fluid intake by much. The only change in medications was the recent prescription for trazodone at bedtime. I have scheduled him an appointment for next Tuesday to see Dr Madilyn Fireman.

## 2018-10-14 NOTE — Telephone Encounter (Signed)
Okay.  Having really push his fluids over the next week.  We will also have him stop the trazodone to make sure that that is not causing it.  If he needs me to call in something different for sleep then just let me know.

## 2018-10-15 NOTE — Telephone Encounter (Signed)
Patient advised.

## 2018-10-19 ENCOUNTER — Ambulatory Visit: Payer: Medicare Other | Admitting: Family Medicine

## 2018-10-22 ENCOUNTER — Ambulatory Visit: Payer: Medicare Other | Admitting: Family Medicine

## 2018-10-22 NOTE — Progress Notes (Deleted)
Established Patient Office Visit  Subjective:  Patient ID: Jon [name], male    DOB: 1945/07/31  Age: 73 y.o. MRN: [id number]  CC: No chief complaint on file.   HPI Jon [name] presents for pressure fluctuations. Last time I saw him we stopped his lisinopril completely bc of hypotension.    Past Medical History:  Diagnosis Date  . BENIGN POSITIONAL VERTIGO 05/20/2010   Qualifier: Diagnosis of  By: Madilyn Fireman MD, Barnetta Chapel    . Diabetes mellitus   . Epididymitis   . Hyperlipemia   . Hypertension   . Stroke [facility]) 7/11    Past Surgical History:  Procedure Laterality Date  . PILONIDAL CYST EXCISION  1960, 61, 62, 63  . VASECTOMY      Family History  Problem Relation Age of Onset  . Diabetes Other        family hx of  . Colon cancer Neg Hx     Social History   Socioeconomic History  . Marital status: Divorced    Spouse name: Not on file  . Number of children: 2  . Years of education: Not on file  . Highest education level: Not on file  Occupational History  . Occupation: Retired  Scientific laboratory technician  . Financial resource strain: Not on file  . Food insecurity    Worry: Not on file    Inability: Not on file  . Transportation needs    Medical: Not on file    Non-medical: Not on file  Tobacco Use  . Smoking status: Current Every Day Smoker    Packs/day: 2.00    Years: 58.00    Pack years: 116.00    Types: Cigarettes  . Smokeless tobacco: Never Used  . Tobacco comment: Counseling sheet given in exam room 07-2011   Substance and Sexual Activity  . Alcohol use: No  . Drug use: No  . Sexual activity: Not on file  Lifestyle  . Physical activity    Days per week: Not on file    Minutes per session: Not on file  . Stress: Not on file  Relationships  . Social Herbalist on phone: Not on file    Gets together: Not on file    Attends religious service: Not on file    Active member of club or organization: Not on file    Attends meetings of  clubs or organizations: Not on file    Relationship status: Not on file  . Intimate partner violence    Fear of current or ex partner: Not on file    Emotionally abused: Not on file    Physically abused: Not on file    Forced sexual activity: Not on file  Other Topics Concern  . Not on file  Social History Narrative   Daily caffeine     Outpatient Medications Prior to Visit  Medication Sig Dispense Refill  . Blood Glucose Monitoring Suppl (ACCU-CHEK AVIVA PLUS) w/Device KIT Check fasting blood sugar daily and before meals. DM Diagnosis 1 kit prn  . clopidogrel (PLAVIX) 75 MG tablet Take 1 tablet (75 mg total) by mouth daily. 90 tablet 1  . fish oil-omega-3 fatty acids 1000 MG capsule Take 4 g by mouth daily.    [name] glucose blood (ACCU-CHEK AVIVA PLUS) test strip USE ONE STRIP TO CHECK BLOOD SUGAR TWICE DAILY AS DIRECTED. Dx: E11.9 200 each 6  . Insulin Pen Needle (B-D ULTRAFINE III SHORT PEN) 31G X 8 MM MISC INJECT  INTO SKIN DAILY. 100 each 3  . ketoconazole (NIZORAL) 2 % cream APPLY CREAM TOPICALLY TO AFFECTED AREA ONCE DAILY FOR 30 DAYS  2  . LEVEMIR FLEXTOUCH 100 UNIT/ML Pen INJECT 10-20 UNITS INTO THE SKIN DAILY AT 10 PM. (Patient taking differently: Inject 26 Units into the skin at bedtime. ) 30 mL 3  . metFORMIN (GLUCOPHAGE) 1000 MG tablet Take 1 tablet (1,000 mg total) by mouth 2 (two) times daily with a meal. 180 tablet 1  . simvastatin (ZOCOR) 40 MG tablet TAKE 1 TABLET BY MOUTH AT  BEDTIME 90 tablet 3  . traZODone (DESYREL) 50 MG tablet Take 0.5-1 tablets (25-50 mg total) by mouth at bedtime as needed for sleep. 90 tablet 1   No facility-administered medications prior to visit.     Allergies  Allergen Reactions  . Gabapentin Other (See Comments)    Nightmares.   . Lisinopril Other (See Comments)    Hypotension    ROS Review of Systems    Objective:    Physical Exam  There were no vitals taken for this visit. Wt Readings from Last 3 Encounters:  10/01/18 216 lb  (98 kg)  07/15/18 215 lb (97.5 kg)  06/30/18 217 lb (98.4 kg)     There are no preventive care reminders to display for this patient.  There are no preventive care reminders to display for this patient.  Lab Results  Component Value Date   TSH 4.28 06/30/2018   Lab Results  Component Value Date   WBC 5.9 06/30/2018   HGB 15.2 06/30/2018   HCT 43.2 06/30/2018   MCV 94.7 06/30/2018   PLT 183 06/30/2018   Lab Results  Component Value Date   NA 138 05/18/2018   K 5.0 05/18/2018   CO2 26 05/18/2018   GLUCOSE 164 (H) 05/18/2018   BUN 11 05/18/2018   CREATININE 1.19 (H) 05/18/2018   BILITOT 0.4 05/18/2018   ALKPHOS 63 12/25/2015   AST 13 05/18/2018   ALT 18 05/18/2018   PROT 6.7 05/18/2018   ALBUMIN 4.4 12/25/2015   CALCIUM 9.4 05/18/2018   Lab Results  Component Value Date   CHOL 159 05/18/2018   Lab Results  Component Value Date   HDL 45 05/18/2018   Lab Results  Component Value Date   LDLCALC 85 05/18/2018   Lab Results  Component Value Date   TRIG 191 (H) 05/18/2018   Lab Results  Component Value Date   CHOLHDL 3.5 05/18/2018   Lab Results  Component Value Date   HGBA1C 7.2 (A) 10/01/2018      Assessment & Plan:   Problem List Items Addressed This Visit    None      No orders of the defined types were placed in this encounter.   Follow-up: No follow-ups on file.    Beatrice Lecher, MD

## 2018-10-27 ENCOUNTER — Other Ambulatory Visit: Payer: Self-pay | Admitting: *Deleted

## 2018-10-27 MED ORDER — ACCU-CHEK AVIVA PLUS VI STRP: ORAL_STRIP | 6 refills | Status: DC

## 2018-10-27 MED ORDER — LEVEMIR FLEXTOUCH 100 UNIT/ML ~~LOC~~ SOPN: PEN_INJECTOR | SUBCUTANEOUS | 3 refills | Status: DC

## 2018-11-02 ENCOUNTER — Other Ambulatory Visit: Payer: Self-pay | Admitting: *Deleted

## 2018-11-02 DIAGNOSIS — F17210 Nicotine dependence, cigarettes, uncomplicated: Secondary | ICD-10-CM

## 2018-11-02 DIAGNOSIS — Z122 Encounter for screening for malignant neoplasm of respiratory organs: Secondary | ICD-10-CM

## 2018-11-02 DIAGNOSIS — Z87891 Personal history of nicotine dependence: Secondary | ICD-10-CM

## 2018-11-10 ENCOUNTER — Ambulatory Visit: Payer: Medicare Other | Admitting: Family Medicine

## 2018-11-13 IMAGING — CT CT NECK W/ CM
3 of 4 series · 13 of 33 positions shown, 16 images · IV contrast (iopamidol)
Comparison: Report from neck CT at [place] 01/30/2014

CLINICAL DATA: History of Warthin's tumore in the left salivary
glands. Swelling now on the right for 3-6 months.

EXAM:
CT NECK WITH CONTRAST
TECHNIQUE: Multidetector CT imaging of the neck was performed using the
standard protocol following the bolus administration of intravenous
contrast.
CONTRAST:  80mL GAXUUK-0CC IOPAMIDOL (GAXUUK-0CC) INJECTION 61%

[Series 5: sag neck · sagittal · 0.57mm/px · 5 of 110 slices shown, 6 images]
[im 37/110  bone]
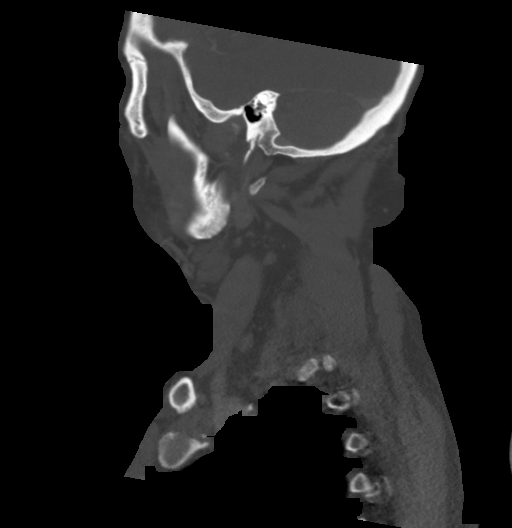
[im 46/110  bone]
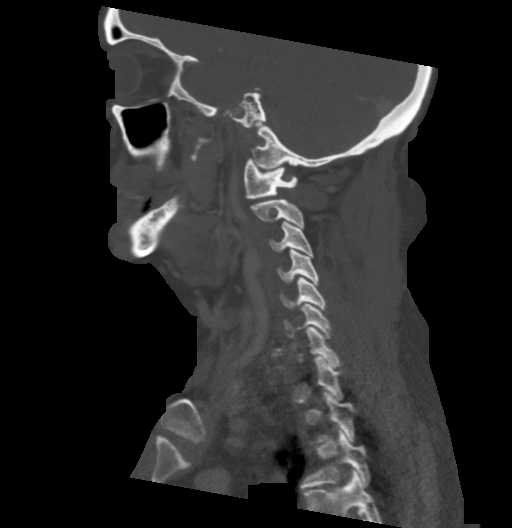
[im 55/110  soft-tissue]
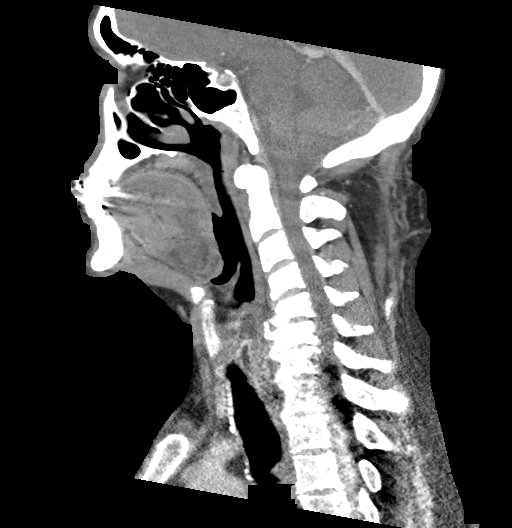
[im 55/110  bone]
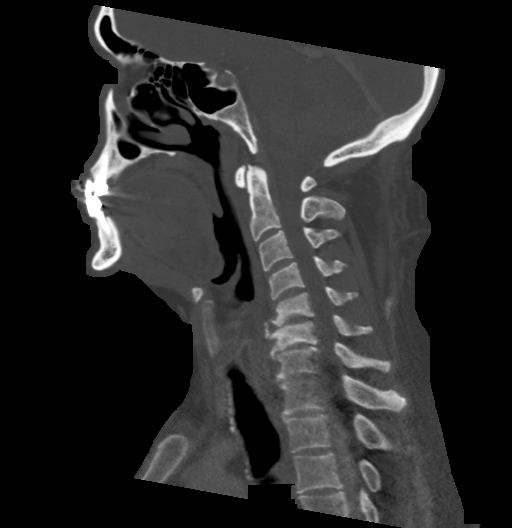
[im 64/110  bone]
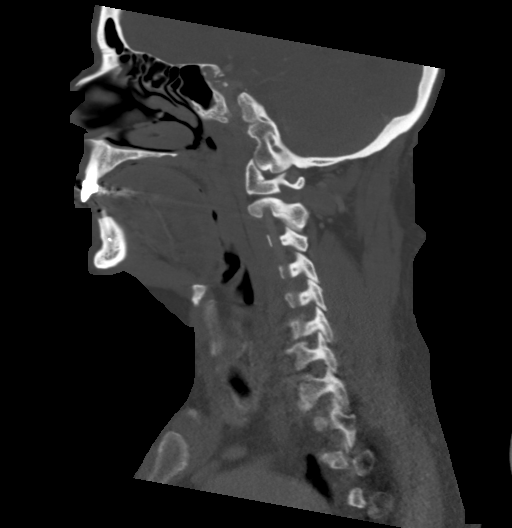
[im 73/110  bone]
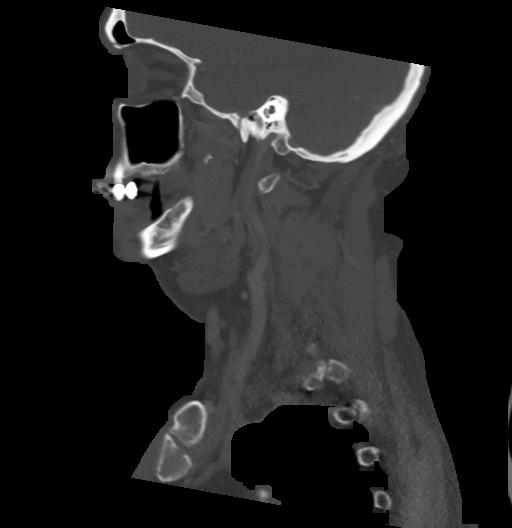

[Series 6: cor neck · coronal · 0.43mm/px · 3 of 153 slices shown]
[im 41/153  bone]
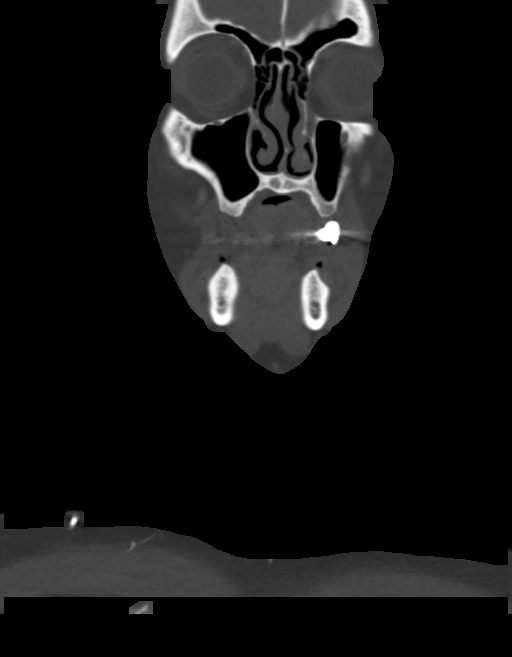
[im 65/153  bone]
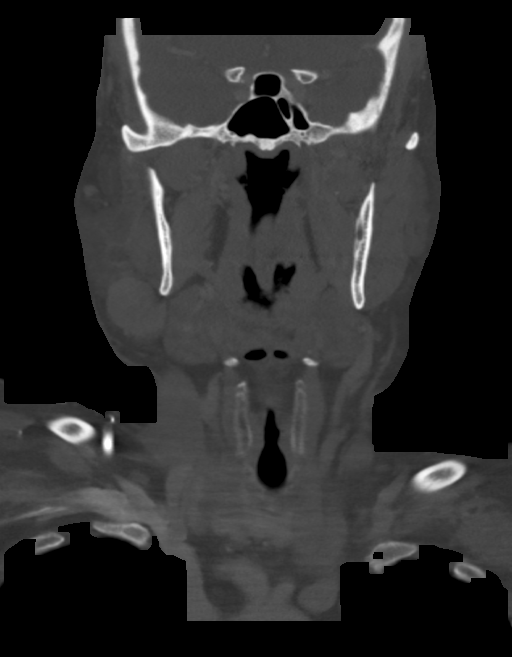
[im 88/153  bone]
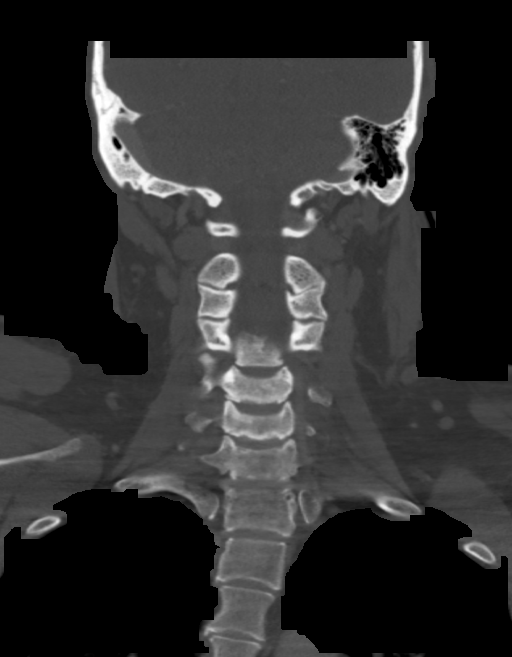

[Series 7: orthogonal ax · axial · 0.43mm/px · z∈[-265,-80]mm · 5 of 142 slices shown, 7 images]
[im 24/142  soft-tissue]
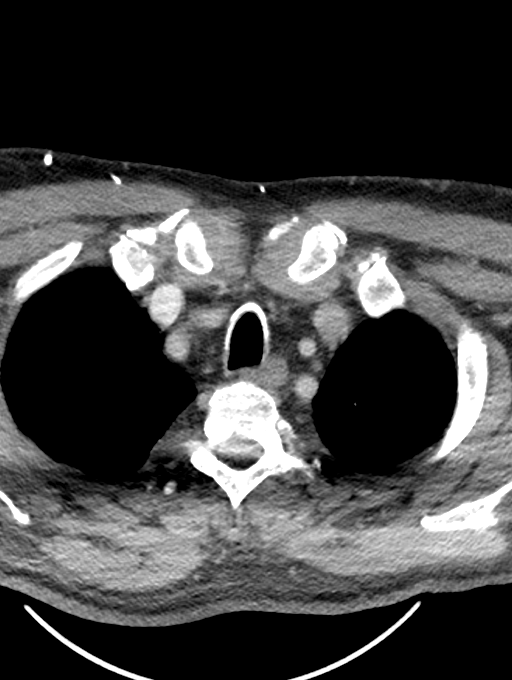
[im 24/142  bone]
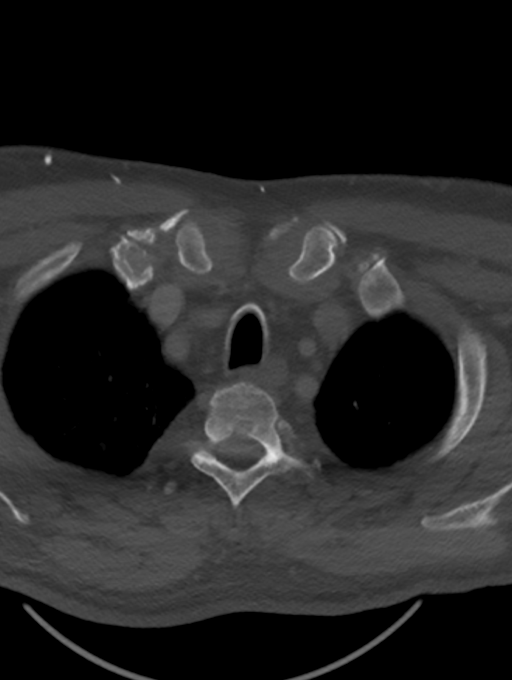
[im 48/142  bone]
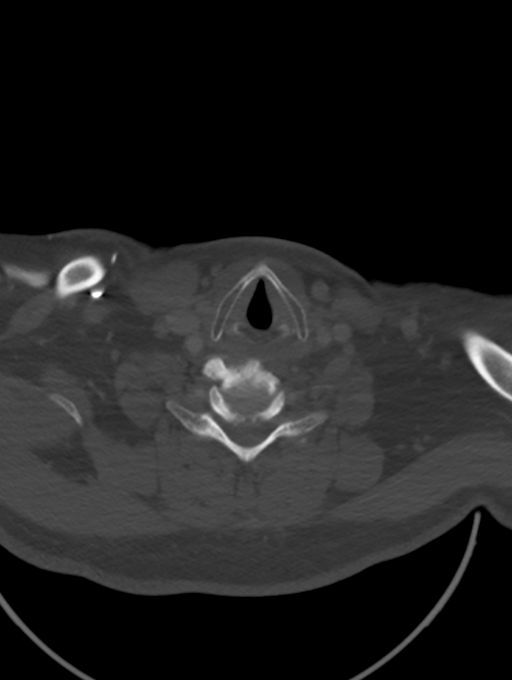
[im 71/142  bone]
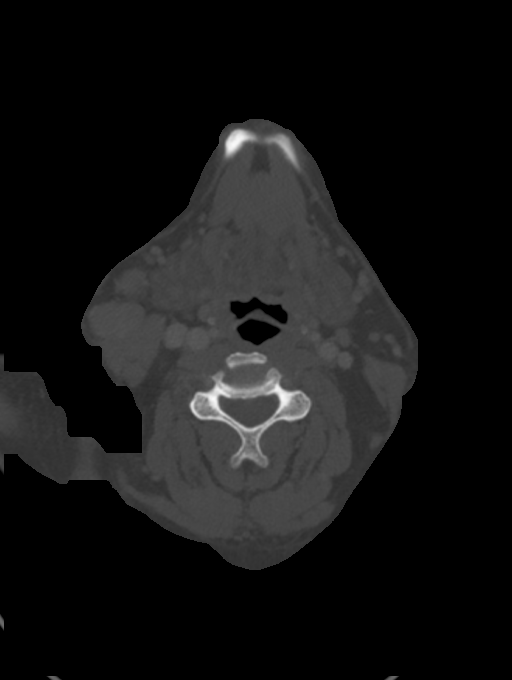
[im 95/142  bone]
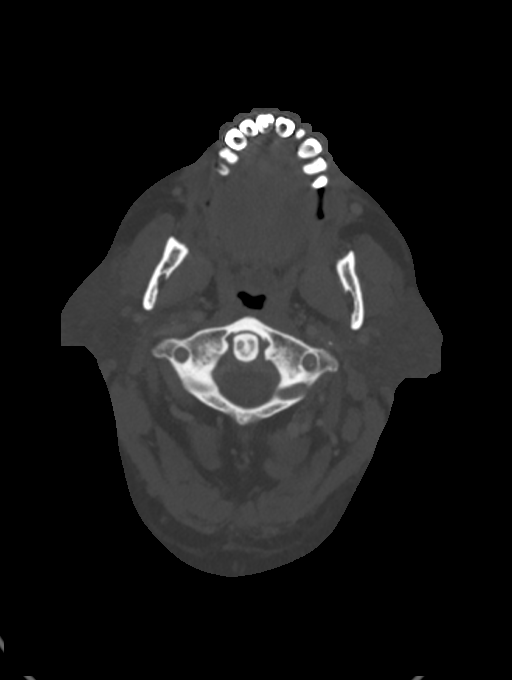
[im 118/142  soft-tissue]
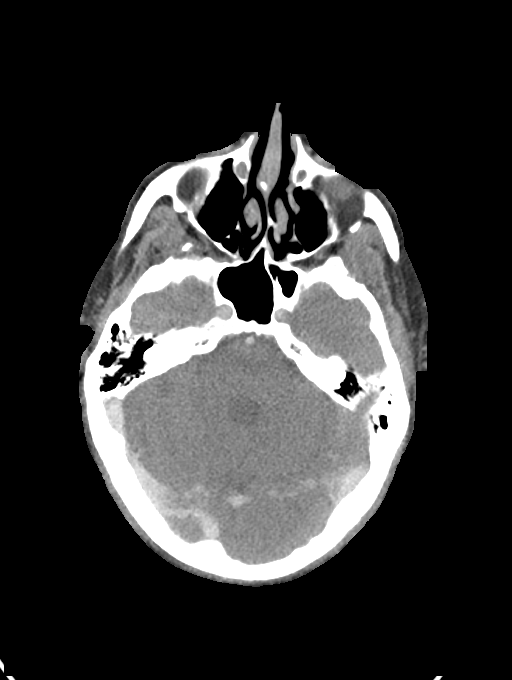
[im 118/142  bone]
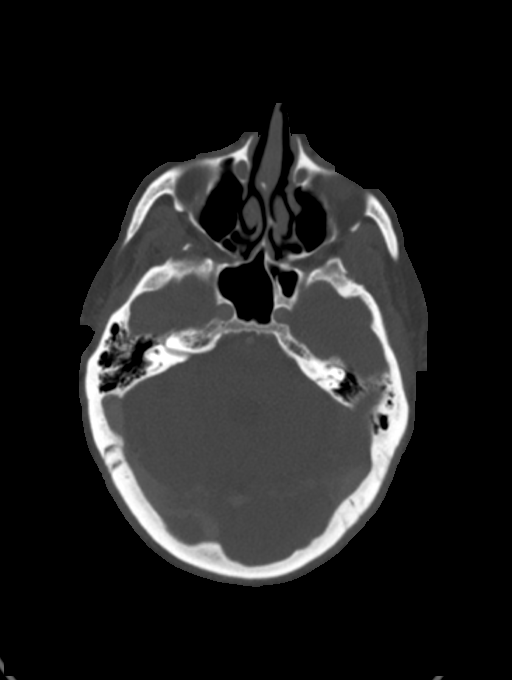

[13 of 33 positions shown; findings below may reference images not displayed]

FINDINGS: Pharynx and larynx: No evidence of mass or inflammation.

Salivary glands: 2 solid and fairly homogeneously enhancing masses
in the right parotid tail. The more anterior measures up to 2.7 cm
(transverse) and the more posterior measures up to 3.8 cm
(craniocaudal). These both measure larger than previously reported.
There is a similar morphology mass in the left parotid tail
measuring up to 2.7 cm in length and 2.1 cm in transverse span, also
larger. All of these masses project beyond the margins of the
parotid tails, but reportedly their epicenter was intraparotid when
smaller.

Thyroid: Negative

Lymph nodes: Negative

Vascular: Atherosclerotic calcification. Notable noncalcified plaque
in the proximal left subclavian with 
50% stenosis.

Limited intracranial: Negative

Visualized orbits: Negative

Mastoids and visualized paranasal sinuses: Clear

Skeleton: No acute or aggressive finding. Cervical spine
degeneration.

Upper chest: No acute finding. Mucous incidentally noted in the
trachea.
IMPRESSION: 1. Neoplasms at the bilateral parotid tail, 2 on the right and 1 on
the left. These have enlarged when correlated with neck CT report
01/30/2014 at [REDACTED], the largest measuring 3.8 cm on the
right. Reportedly there has been prior biopsy confirming Warthin's
tumor in one of these. All 3 masses have a similar appearance.
2. Atherosclerosis with 
50% stenosis of the proximal left
subclavian.

## 2018-12-03 ENCOUNTER — Telehealth: Payer: Self-pay | Admitting: Acute Care

## 2018-12-03 NOTE — Telephone Encounter (Signed)
Called [facility] and spoke with [place]. Per Glenard Haring, due to pt having a lung screen CT performed a year ago, the order needs to be for a follow up lung cancer screen CT.  Glenard Haring also stated that when it is signed by SG, it cannot say verbal cosign as it has to be signed by SG instead of saying the verbal cosign.   Pt is scheduled to have the CT performed. Sarah, please advise exactly how the order needs to be done so it can be taken care of. Thanks!

## 2018-12-06 ENCOUNTER — Ambulatory Visit: Admission: RE | Admit: 2018-12-06 | Payer: Medicare Other | Source: Ambulatory Visit

## 2018-12-06 NOTE — Telephone Encounter (Signed)
Glenard Haring called this morning and said to disreguard former messages the order was correctt and was signed.Jon [name]

## 2018-12-06 NOTE — Telephone Encounter (Signed)
Called and spoke with Glenard Haring from [place] at [facility] to see if all had been taken care of with the order. Per Glenard Haring, the order was actually correct so nothing needed to be changed with it. Nothing further needed.

## 2018-12-06 NOTE — Telephone Encounter (Signed)
Jon [name] is calling back about order. Pt appointment is @ 9 am. CB [phone]

## 2018-12-28 ENCOUNTER — Other Ambulatory Visit: Payer: Self-pay | Admitting: *Deleted

## 2018-12-28 MED ORDER — CLOPIDOGREL BISULFATE 75 MG PO TABS: 75.0000 mg | ORAL_TABLET | Freq: Every day | ORAL | 1 refills | Status: DC

## 2019-01-03 ENCOUNTER — Other Ambulatory Visit: Payer: Self-pay | Admitting: Family Medicine

## 2019-01-04 ENCOUNTER — Other Ambulatory Visit: Payer: Self-pay

## 2019-01-04 NOTE — Patient Outreach (Signed)
[facility]) Care Management  01/04/2019  Jon [name] [birth year] [id number]   Medication Adherence call to Jon [name] HIPPA Compliant Voice message left with a call back number. Jon [name] is showing past due on Lisinopril 2.5 mg under [place].   [place] Management Direct Dial [phone]  Fax [phone] [name]

## 2019-02-03 ENCOUNTER — Ambulatory Visit: Payer: Medicare Other | Admitting: Family Medicine

## 2019-02-09 ENCOUNTER — Ambulatory Visit (INDEPENDENT_AMBULATORY_CARE_PROVIDER_SITE_OTHER): Payer: Medicare Other | Admitting: Family Medicine

## 2019-02-09 ENCOUNTER — Encounter: Payer: Self-pay | Admitting: Family Medicine

## 2019-02-09 ENCOUNTER — Other Ambulatory Visit: Payer: Self-pay

## 2019-02-09 VITALS — BP 120/70 | HR 73 | Temp 98.3°F | Ht 73.0 in | Wt 215.0 lb

## 2019-02-09 DIAGNOSIS — Z23 Encounter for immunization: Secondary | ICD-10-CM | POA: Diagnosis not present

## 2019-02-09 DIAGNOSIS — E119 Type 2 diabetes mellitus without complications: Secondary | ICD-10-CM | POA: Diagnosis not present

## 2019-02-09 DIAGNOSIS — L820 Inflamed seborrheic keratosis: Secondary | ICD-10-CM | POA: Diagnosis not present

## 2019-02-09 DIAGNOSIS — N183 Chronic kidney disease, stage 3 unspecified: Secondary | ICD-10-CM

## 2019-02-09 DIAGNOSIS — F17210 Nicotine dependence, cigarettes, uncomplicated: Secondary | ICD-10-CM

## 2019-02-09 DIAGNOSIS — E1122 Type 2 diabetes mellitus with diabetic chronic kidney disease: Secondary | ICD-10-CM | POA: Diagnosis not present

## 2019-02-09 LAB — POCT GLYCOSYLATED HEMOGLOBIN (HGB A1C): Hemoglobin A1C: 7.5 % — AB (ref 4.0–5.6)

## 2019-02-09 NOTE — Assessment & Plan Note (Signed)
A1c is up a little bit from previous it is now 7.5 today but still at goal based on his age she is not having any low blood sugars and is over all doing well.  We did discuss the possibility of decreasing his metformin to see if that might help with the intermittent loose stools but he declined and says he will just stick with what he is doing follow-up in 4 months.

## 2019-02-09 NOTE — Progress Notes (Signed)
Pt is taking 30 U of levemir.  Maryruth Eve, Lahoma Crocker, CMA

## 2019-02-09 NOTE — Progress Notes (Signed)
Established Patient Office Visit  Subjective:  Patient ID: Jon [name], male    DOB: 01/12/1946  Age: 72 y.o. MRN: [id number]  CC:  Chief Complaint  Patient presents with  . Diabetes    HPI Jon [name] presents for   Diabetes - no hypoglycemic events. No wounds or sores that are not healing well. No increased thirst or urination. Checking glucose at home. Taking medications as prescribed without any side effects.  He says about once every 3 or 4 weeks he will have urgent diarrhea he is pretty sure it is from the metformin.  He had called back in the summer probably in about June he was having low blood pressures.  At that time we only had him on about 2.5 mg of lisinopril for renal protection as he has no prior history of hypertension though he does have a history of coronary artery disease.  He nearly passed out at the golf [place] a couple times he was out multiple days a week and 90+ degree heat.  We discussed hydrating better and holding his lisinopril which he did for about a total of 4 to 5 months.  He says his blood pressures have looked great over the last month and so he decided to restart his lisinopril today.  He will let us know if he starts having any problems.  He also has several skin lesions on the top of his head where he has hair loss that are bothering him.  He says sometimes they rub on his And actually get irritated and bleed.  Also has what he thinks might be a scab on the inner part of his right ear that he would like me to look at today.   Past Medical History:  Diagnosis Date  . BENIGN POSITIONAL VERTIGO 05/20/2010   Qualifier: Diagnosis of  By: Madilyn Fireman MD, Barnetta Chapel    . Diabetes mellitus   . Epididymitis   . Hyperlipemia   . Hypertension   . Stroke [facility]) 7/11    Past Surgical History:  Procedure Laterality Date  . PILONIDAL CYST EXCISION  1960, 61, 62, 63  . VASECTOMY      Family History  Problem Relation Age of Onset  . Diabetes Other         family hx of  . Colon cancer Neg Hx     Social History   Socioeconomic History  . Marital status: Divorced    Spouse name: Not on file  . Number of children: 2  . Years of education: Not on file  . Highest education level: Not on file  Occupational History  . Occupation: Retired  Scientific laboratory technician  . Financial resource strain: Not on file  . Food insecurity    Worry: Not on file    Inability: Not on file  . Transportation needs    Medical: Not on file    Non-medical: Not on file  Tobacco Use  . Smoking status: Current Every Day Smoker    Packs/day: 2.00    Years: 58.00    Pack years: 116.00    Types: Cigarettes  . Smokeless tobacco: Never Used  . Tobacco comment: Counseling sheet given in exam room 07-2011   Substance and Sexual Activity  . Alcohol use: No  . Drug use: No  . Sexual activity: Not on file  Lifestyle  . Physical activity    Days per week: Not on file    Minutes per session: Not on file  . Stress:  Not on file  Relationships  . Social Herbalist on phone: Not on file    Gets together: Not on file    Attends religious service: Not on file    Active member of club or organization: Not on file    Attends meetings of clubs or organizations: Not on file    Relationship status: Not on file  . Intimate partner violence    Fear of current or ex partner: Not on file    Emotionally abused: Not on file    Physically abused: Not on file    Forced sexual activity: Not on file  Other Topics Concern  . Not on file  Social History Narrative   Daily caffeine     Outpatient Medications Prior to Visit  Medication Sig Dispense Refill  . Blood Glucose Monitoring Suppl (ACCU-CHEK AVIVA PLUS) w/Device KIT Check fasting blood sugar daily and before meals. DM Diagnosis 1 kit prn  . clopidogrel (PLAVIX) 75 MG tablet Take 1 tablet (75 mg total) by mouth daily. 90 tablet 1  . fish oil-omega-3 fatty acids 1000 MG capsule Take 4 g by mouth daily.    [name] glucose  blood (ACCU-CHEK AVIVA PLUS) test strip USE ONE STRIP TO CHECK BLOOD SUGAR TWICE DAILY AS DIRECTED. Dx: E11.9 200 each 6  . Insulin Detemir (LEVEMIR FLEXTOUCH) 100 UNIT/ML Pen INJECT 10-20 UNITS INTO THE SKIN DAILY AT 10 PM. 30 mL 3  . Insulin Pen Needle (B-D ULTRAFINE III SHORT PEN) 31G X 8 MM MISC INJECT INTO SKIN DAILY. 100 each 3  . ketoconazole (NIZORAL) 2 % cream APPLY CREAM TOPICALLY TO AFFECTED AREA ONCE DAILY FOR 30 DAYS  2  . lisinopril (ZESTRIL) 2.5 MG tablet Take 1 tablet by mouth daily.    [name] lisinopril (ZESTRIL) 2.5 MG tablet Take 2.5 mg by mouth daily.    . metFORMIN (GLUCOPHAGE) 1000 MG tablet TAKE 1 TABLET BY MOUTH TWICE DAILY WITH MEALS 180 tablet 0  . simvastatin (ZOCOR) 40 MG tablet TAKE 1 TABLET BY MOUTH AT  BEDTIME 90 tablet 3  . traZODone (DESYREL) 50 MG tablet Take 0.5-1 tablets (25-50 mg total) by mouth at bedtime as needed for sleep. 90 tablet 1   No facility-administered medications prior to visit.     Allergies  Allergen Reactions  . Gabapentin Other (See Comments)    Nightmares.   . Lisinopril Other (See Comments)    Hypotension    ROS Review of Systems    Objective:    Physical Exam  Constitutional: He is oriented to person, place, and time. He appears well-developed and well-nourished.  HENT:  Head: Normocephalic and atraumatic.  Cardiovascular: Normal rate, regular rhythm and normal heart sounds.  Pulmonary/Chest: Effort normal and breath sounds normal.  Neurological: He is alert and oriented to person, place, and time.  Skin: Skin is warm and dry.  On his scalp he has several seborrheic keratoses.  None are actively bleeding today.  Though he did show me the inside of his ball Where it had bled previously.  He also has a slightly thickened keratotic lesion in the right ear that also looks most consistent with a seborrheic keratosis.  Psychiatric: He has a normal mood and affect. His behavior is normal.    BP 120/70   Pulse 73   Temp 98.3 F  (36.8 C)   Ht '6\' 1"'$  (1.854 m)   Wt 215 lb (97.5 kg)   SpO2 100%   BMI 28.37 kg/m  Wt Readings from Last 3 Encounters:  02/09/19 215 lb (97.5 kg)  10/01/18 216 lb (98 kg)  07/15/18 215 lb (97.5 kg)     Health Maintenance Due  Topic Date Due  . INFLUENZA VACCINE  12/11/2018    There are no preventive care reminders to display for this patient.  Lab Results  Component Value Date   TSH 4.28 06/30/2018   Lab Results  Component Value Date   WBC 5.9 06/30/2018   HGB 15.2 06/30/2018   HCT 43.2 06/30/2018   MCV 94.7 06/30/2018   PLT 183 06/30/2018   Lab Results  Component Value Date   NA 138 05/18/2018   K 5.0 05/18/2018   CO2 26 05/18/2018   GLUCOSE 164 (H) 05/18/2018   BUN 11 05/18/2018   CREATININE 1.19 (H) 05/18/2018   BILITOT 0.4 05/18/2018   ALKPHOS 63 12/25/2015   AST 13 05/18/2018   ALT 18 05/18/2018   PROT 6.7 05/18/2018   ALBUMIN 4.4 12/25/2015   CALCIUM 9.4 05/18/2018   Lab Results  Component Value Date   CHOL 159 05/18/2018   Lab Results  Component Value Date   HDL 45 05/18/2018   Lab Results  Component Value Date   LDLCALC 85 05/18/2018   Lab Results  Component Value Date   TRIG 191 (H) 05/18/2018   Lab Results  Component Value Date   CHOLHDL 3.5 05/18/2018   Lab Results  Component Value Date   HGBA1C 7.5 (A) 02/09/2019      Assessment & Plan:   Problem List Items Addressed This Visit      Endocrine   DM (diabetes mellitus) ([place]) - Primary    A1c is up a little bit from previous it is now 7.5 today but still at goal based on his age she is not having any low blood sugars and is over all doing well.  We did discuss the possibility of decreasing his metformin to see if that might help with the intermittent loose stools but he declined and says he will just stick with what he is doing follow-up in 4 months.      Relevant Medications   lisinopril (ZESTRIL) 2.5 MG tablet   lisinopril (ZESTRIL) 2.5 MG tablet   Other Relevant Orders    POCT glycosylated hemoglobin (Hb A1C) (Completed)   BASIC METABOLIC PANEL WITH GFR   CKD stage 3 due to type 2 diabetes mellitus ([place])    Due to recheck renal function today.  I try to keep track of it every 6 months.  He is restarting his 2.5 mg of lisinopril daily for renal protection.      Relevant Medications   lisinopril (ZESTRIL) 2.5 MG tablet   lisinopril (ZESTRIL) 2.5 MG tablet     Other   Continuous dependence on cigarette smoking    Continues to smoke and is not ready to quit.       Other Visit Diagnoses    Seborrheic keratoses, inflamed       Need for immunization against influenza       Relevant Orders   Flu Vaccine QUAD High Dose(Fluad) (Completed)     Seborrheic keratoses-discussed treatment with cryotherapy.  He has had lesions frozen before and was okay with moving forward with more definitive treatment.  Cryotherapy Procedure Note  Pre-operative Diagnosis: Keratoses, inflamed  Post-operative Diagnosis: same  Locations: top of head and right ear.   Indications: inflammation/bleeding  Anesthesia: not required    Procedure Details  Patient informed of  risks (permanent scarring, infection, light or dark discoloration, bleeding, infection, weakness, numbness and recurrence of the lesion) and benefits of the procedure and verbal informed consent obtained.  The areas are treated with liquid nitrogen therapy, frozen until ice ball extended 1-2 mm beyond lesion, allowed to thaw, and treated again. The patient tolerated procedure well.  The patient was instructed on post-op care, warned that there may be blister formation, redness and pain. Recommend OTC analgesia as needed for pain.  Condition: Stable  Complications: none.  Plan: 1. Instructed to keep the area dry and covered for 24-48h and clean thereafter. 2. Warning signs of infection were reviewed.   3. Recommended that the patient use OTC acetaminophen as needed for pain.  4. Return PRN.    No  orders of the defined types were placed in this encounter.   Follow-up: Return in about 3 months (around 05/11/2019) for Diabetes follow-up.    Beatrice Lecher, MD

## 2019-02-09 NOTE — Assessment & Plan Note (Signed)
Continues to smoke and is not ready to quit.

## 2019-02-09 NOTE — Assessment & Plan Note (Addendum)
Due to recheck renal function today.  I try to keep track of it every 6 months.  He is restarting his 2.5 mg of lisinopril daily for renal protection.

## 2019-02-10 LAB — BASIC METABOLIC PANEL WITH GFR
BUN/Creatinine Ratio: 10 (calc) (ref 6–22)
BUN: 14 mg/dL (ref 7–25)
CO2: 27 mmol/L (ref 20–32)
Calcium: 9.8 mg/dL (ref 8.6–10.3)
Chloride: 105 mmol/L (ref 98–110)
Creat: 1.35 mg/dL — ABNORMAL HIGH (ref 0.70–1.18)
GFR, Est African American: 60 mL/min/{1.73_m2} (ref >=60)
GFR, Est Non African American: 52 mL/min/{1.73_m2} — ABNORMAL LOW (ref >=60)
Glucose, Bld: 197 mg/dL — ABNORMAL HIGH (ref 65–99)
Potassium: 5 mmol/L (ref 3.5–5.3)
Sodium: 140 mmol/L (ref 135–146)

## 2019-02-28 ENCOUNTER — Other Ambulatory Visit: Payer: Self-pay | Admitting: Family Medicine

## 2019-03-24 ENCOUNTER — Encounter: Payer: Self-pay | Admitting: Family Medicine

## 2019-03-24 NOTE — Progress Notes (Signed)
error 

## 2019-04-20 MED ORDER — KETOCONAZOLE 2 % EX CREA: TOPICAL_CREAM | CUTANEOUS | 2 refills | Status: DC

## 2019-04-20 NOTE — Telephone Encounter (Signed)
Patient reports he has seen the PCP recently and is in need of a refill of his Ketoconazole cream. It has been sent to the pharmacy. No other questions at this time.

## 2019-04-20 NOTE — Addendum Note (Signed)
Addended by: Dessie Coma on: 04/20/2019 01:53 PM   Modules accepted: Orders

## 2019-04-29 ENCOUNTER — Other Ambulatory Visit: Payer: Self-pay | Admitting: Family Medicine

## 2019-05-03 ENCOUNTER — Other Ambulatory Visit: Payer: Self-pay | Admitting: Family Medicine

## 2019-05-18 ENCOUNTER — Other Ambulatory Visit: Payer: Self-pay | Admitting: Family Medicine

## 2019-06-13 ENCOUNTER — Ambulatory Visit: Payer: Medicare Other | Admitting: Family Medicine

## 2019-06-26 ENCOUNTER — Other Ambulatory Visit: Payer: Self-pay | Admitting: Family Medicine

## 2019-06-27 NOTE — Telephone Encounter (Signed)
Must make appointment 

## 2019-07-27 ENCOUNTER — Other Ambulatory Visit: Payer: Self-pay | Admitting: *Deleted

## 2019-07-27 ENCOUNTER — Other Ambulatory Visit: Payer: Self-pay | Admitting: Family Medicine

## 2019-07-27 DIAGNOSIS — E119 Type 2 diabetes mellitus without complications: Secondary | ICD-10-CM

## 2019-07-27 MED ORDER — BD PEN NEEDLE SHORT U/F 31G X 8 MM MISC: 3 refills | Status: DC

## 2019-07-28 ENCOUNTER — Other Ambulatory Visit: Payer: Self-pay | Admitting: *Deleted

## 2019-07-28 MED ORDER — METFORMIN HCL 1000 MG PO TABS: 1000.0000 mg | ORAL_TABLET | Freq: Two times a day (BID) | ORAL | 1 refills | Status: DC

## 2019-08-19 ENCOUNTER — Other Ambulatory Visit: Payer: Self-pay

## 2019-08-19 MED ORDER — SIMVASTATIN 40 MG PO TABS: 40.0000 mg | ORAL_TABLET | Freq: Every day | ORAL | 0 refills | Status: DC

## 2019-09-02 ENCOUNTER — Ambulatory Visit: Payer: Medicare Other | Attending: Internal Medicine

## 2019-09-02 DIAGNOSIS — Z23 Encounter for immunization: Secondary | ICD-10-CM

## 2019-09-02 NOTE — Progress Notes (Signed)
   Covid-19 Vaccination Clinic  Name:  Jon [name]    MRN: [id number] DOB: 11-13-1945  09/02/2019  [name] was observed post Covid-19 immunization for 15 minutes without incident. He was provided with Vaccine Information Sheet and instruction to access the V-Safe system.   [name] was instructed to call 911 with any severe reactions post vaccine: [name] Difficulty breathing  . Swelling of face and throat  . A fast heartbeat  . A bad rash all over body  . Dizziness and weakness   Immunizations Administered    Name Date Dose VIS Date Route   Pfizer COVID-19 Vaccine 09/02/2019 11:02 AM 0.3 mL 07/06/2018 Intramuscular   Manufacturer: [employer], [employer]   Lot: [id number]   Reeder: [id number]

## 2019-09-07 ENCOUNTER — Ambulatory Visit (INDEPENDENT_AMBULATORY_CARE_PROVIDER_SITE_OTHER): Payer: Medicare Other | Admitting: Family Medicine

## 2019-09-07 ENCOUNTER — Encounter: Payer: Self-pay | Admitting: Family Medicine

## 2019-09-07 ENCOUNTER — Telehealth: Payer: Self-pay | Admitting: Family Medicine

## 2019-09-07 ENCOUNTER — Other Ambulatory Visit: Payer: Self-pay

## 2019-09-07 VITALS — BP 92/51 | HR 42 | Ht 73.0 in | Wt 215.0 lb

## 2019-09-07 DIAGNOSIS — E785 Hyperlipidemia, unspecified: Secondary | ICD-10-CM

## 2019-09-07 DIAGNOSIS — E1122 Type 2 diabetes mellitus with diabetic chronic kidney disease: Secondary | ICD-10-CM | POA: Diagnosis not present

## 2019-09-07 DIAGNOSIS — N183 Chronic kidney disease, stage 3 unspecified: Secondary | ICD-10-CM | POA: Diagnosis not present

## 2019-09-07 DIAGNOSIS — R0602 Shortness of breath: Secondary | ICD-10-CM

## 2019-09-07 DIAGNOSIS — R55 Syncope and collapse: Secondary | ICD-10-CM

## 2019-09-07 DIAGNOSIS — Z125 Encounter for screening for malignant neoplasm of prostate: Secondary | ICD-10-CM | POA: Diagnosis not present

## 2019-09-07 DIAGNOSIS — E119 Type 2 diabetes mellitus without complications: Secondary | ICD-10-CM

## 2019-09-07 DIAGNOSIS — R42 Dizziness and giddiness: Secondary | ICD-10-CM

## 2019-09-07 DIAGNOSIS — G4700 Insomnia, unspecified: Secondary | ICD-10-CM

## 2019-09-07 DIAGNOSIS — E1142 Type 2 diabetes mellitus with diabetic polyneuropathy: Secondary | ICD-10-CM

## 2019-09-07 LAB — POCT GLYCOSYLATED HEMOGLOBIN (HGB A1C): Hemoglobin A1C: 7.7 % — AB (ref 4.0–5.6)

## 2019-09-07 MED ORDER — SYNJARDY 12.5-500 MG PO TABS: 1.0000 | ORAL_TABLET | Freq: Two times a day (BID) | ORAL | 1 refills | Status: DC

## 2019-09-07 MED ORDER — SIMVASTATIN 40 MG PO TABS: 40.0000 mg | ORAL_TABLET | Freq: Every day | ORAL | 3 refills | Status: DC

## 2019-09-07 NOTE — Telephone Encounter (Signed)
Please call patient and let let him know that I looked over his cardiac work-up from 2019.  The only thing that I did not see that I think we should address is an echocardiogram which is an ultrasound of the heart to make sure that the aortic valve is not stiffer narrow which could be affecting blood flow particularly when he changes position.  If he is okay with this I like to get it set up at our [facility] location at his convenience.

## 2019-09-07 NOTE — Assessment & Plan Note (Signed)
He reports that he is actually doing really well with his trazodone he would like to continue it in fact he says he gets about 10 hours of sleep on it which seems a little bit excessive but he says he goes to bed and is able to fall asleep without difficulty and he wakes up really well rested.  He says this made a huge difference in his chronic nightmares that he was experiencing.  He also feels like it keeps him from dwelling on the past

## 2019-09-07 NOTE — Progress Notes (Signed)
Established Patient Office Visit  Subjective:  Patient ID: Jon [name], male    DOB: 1946-01-23  Age: 74 y.o. MRN: [id number]  CC:  Chief Complaint  Patient presents with  . Follow-up  . Hypertension  . Diabetes    HPI Jon [name] presents for f/u - has been 8 month since last OV.   Diabetes - no hypoglycemic events. No wounds or sores that are not healing well. No increased thirst or urination. Checking glucose at home. Taking medications as prescribed without any side effects.  Reports that he recently cut his long-acting insulin down because he noticed that his blood sugars were running in the 1 10-1 20 range.  He is now only giving 20 units of Levemir  BPs have been low.  See previous note from September.  He been having intermittent spells of feeling like he was getting blackout and when he would check his blood pressure afterwards it would be quite low.  In fact he says it happened again when he went golfing yesterday.  It usually only happens when he is out golfing for several hours and has not had anything to eat though he usually says he does drink at least a 12-16 out diet soda while he is golfing.  And usually only if it is really sunny outside he says if it is cloudier shady it does not seem to happen it never happens during the day when he is just at home or running errands.  He does note that it happens more when he is bending over to put the golf ball down and then when he tries to stand up he will have to go really slowly or he will feel like his got a black out or pass out.  We had significantly decreased his blood pressure medication down to lisinopril 2.5 which she says he actually discontinued since I last saw him we have tried to keep just a small dose for renal protection because of his diabetes  Past Medical History:  Diagnosis Date  . BENIGN POSITIONAL VERTIGO 05/20/2010   Qualifier: Diagnosis of  By: Madilyn Fireman MD, Barnetta Chapel    . Diabetes mellitus   .  Epididymitis   . Hyperlipemia   . Hypertension   . Stroke [facility]) 7/11    Past Surgical History:  Procedure Laterality Date  . PILONIDAL CYST EXCISION  1960, 61, 62, 63  . VASECTOMY      Family History  Problem Relation Age of Onset  . Diabetes Other        family hx of  . Colon cancer Neg Hx     Social History   Socioeconomic History  . Marital status: Divorced    Spouse name: Not on file  . Number of children: 2  . Years of education: Not on file  . Highest education level: Not on file  Occupational History  . Occupation: Retired  Tobacco Use  . Smoking status: Current Every Day Smoker    Packs/day: 2.00    Years: 58.00    Pack years: 116.00    Types: Cigarettes  . Smokeless tobacco: Never Used  . Tobacco comment: Counseling sheet given in exam room 07-2011   Substance and Sexual Activity  . Alcohol use: No  . Drug use: No  . Sexual activity: Not on file  Other Topics Concern  . Not on file  Social History Narrative   Daily caffeine    Social Determinants of Health   Financial Resource Strain:   .  Difficulty of Paying Living Expenses:   Food Insecurity:   . Worried About Charity fundraiser in the Last Year:   . Arboriculturist in the Last Year:   Transportation Needs:   . Film/video editor (Medical):   [name] Lack of Transportation (Non-Medical):   Physical Activity:   . Days of Exercise per Week:   . Minutes of Exercise per Session:   Stress:   . Feeling of Stress :   Social Connections:   . Frequency of Communication with Friends and Family:   . Frequency of Social Gatherings with Friends and Family:   . Attends Religious Services:   . Active Member of Clubs or Organizations:   . Attends Archivist Meetings:   [name] Marital Status:   Intimate Partner Violence:   . Fear of Current or Ex-Partner:   . Emotionally Abused:   [name] Physically Abused:   . Sexually Abused:     Outpatient Medications Prior to Visit  Medication Sig Dispense Refill   . Blood Glucose Monitoring Suppl (ACCU-CHEK AVIVA PLUS) w/Device KIT Check fasting blood sugar daily and before meals. DM Diagnosis 1 kit prn  . clopidogrel (PLAVIX) 75 MG tablet TAKE 1 TABLET BY MOUTH  DAILY 90 tablet 3  . fish oil-omega-3 fatty acids 1000 MG capsule Take 4 g by mouth daily.    [name] glucose blood (ACCU-CHEK AVIVA PLUS) test strip USE ONE STRIP TO CHECK BLOOD SUGAR TWICE DAILY AS DIRECTED. Dx: E11.9 200 each 6  . Insulin Detemir (LEVEMIR FLEXTOUCH) 100 UNIT/ML Pen INJECT 10-20 UNITS INTO THE SKIN DAILY AT 10 PM. 30 mL 3  . Insulin Pen Needle (B-D ULTRAFINE III SHORT PEN) 31G X 8 MM MISC INJECT INTO SKIN DAILY. 100 each 3  . traZODone (DESYREL) 50 MG tablet TAKE 1/2 TO 1 TABLET BY  MOUTH AT BEDTIME AS NEEDED  FOR SLEEP 90 tablet 3  . ketoconazole (NIZORAL) 2 % cream APPLY CREAM TOPICALLY TO AFFECTED AREA ONCE DAILY FOR 30 DAYS 15 g 2  . lisinopril (ZESTRIL) 2.5 MG tablet TAKE 1 TABLET BY MOUTH  DAILY 90 tablet 3  . metFORMIN (GLUCOPHAGE) 1000 MG tablet Take 1 tablet (1,000 mg total) by mouth 2 (two) times daily with a meal. 180 tablet 1  . simvastatin (ZOCOR) 40 MG tablet Take 1 tablet (40 mg total) by mouth at bedtime. MUST MAKE APPOINTMENT 30 tablet 0   No facility-administered medications prior to visit.    Allergies  Allergen Reactions  . Gabapentin Other (See Comments)    Nightmares.   . Lisinopril Other (See Comments)    Hypotension    ROS Review of Systems    Objective:    Physical Exam  Constitutional: He is oriented to person, place, and time. He appears well-developed and well-nourished.  HENT:  Head: Normocephalic and atraumatic.  Eyes: Conjunctivae are normal.  Cardiovascular: Normal rate, regular rhythm and normal heart sounds.  Pulmonary/Chest: Effort normal and breath sounds normal.  Neurological: He is alert and oriented to person, place, and time.  Skin: Skin is warm and dry.  Psychiatric: He has a normal mood and affect. His behavior is normal.     BP (!) 92/51   Pulse (!) 42   Ht _0  (1.854 m)   Wt 215 lb (97.5 kg)   SpO2 97%   BMI 28.37 kg/m  Wt Readings from Last 3 Encounters:  09/07/19 215 lb (97.5 kg)  02/09/19 215 lb (97.5 kg)  10/01/18  216 lb (98 kg)     There are no preventive care reminders to display for this patient.  There are no preventive care reminders to display for this patient.  Lab Results  Component Value Date   TSH 4.28 06/30/2018   Lab Results  Component Value Date   WBC 5.9 06/30/2018   HGB 15.2 06/30/2018   HCT 43.2 06/30/2018   MCV 94.7 06/30/2018   PLT 183 06/30/2018   Lab Results  Component Value Date   NA 140 02/09/2019   K 5.0 02/09/2019   CO2 27 02/09/2019   GLUCOSE 197 (H) 02/09/2019   BUN 14 02/09/2019   CREATININE 1.35 (H) 02/09/2019   BILITOT 0.4 05/18/2018   ALKPHOS 63 12/25/2015   AST 13 05/18/2018   ALT 18 05/18/2018   PROT 6.7 05/18/2018   ALBUMIN 4.4 12/25/2015   CALCIUM 9.8 02/09/2019   Lab Results  Component Value Date   CHOL 159 05/18/2018   Lab Results  Component Value Date   HDL 45 05/18/2018   Lab Results  Component Value Date   LDLCALC 85 05/18/2018   Lab Results  Component Value Date   TRIG 191 (H) 05/18/2018   Lab Results  Component Value Date   CHOLHDL 3.5 05/18/2018   Lab Results  Component Value Date   HGBA1C 7.7 (A) 09/07/2019      Assessment & Plan:   Problem List Items Addressed This Visit      Endocrine   DM (diabetes mellitus) ([place]) - Primary    He would really like to get back on Synjardy he was on it for about a year and a half and eventually we stopped it because of insurance.  We will go ahead and restart Synjardy and sent to his mail order.  Hopefully we can decrease his insulin.  We also discussed with him checking his blood glucose level when he is out golfing it sounds like he normally eats breakfast around 9 and then goes golfing around noon to about 4:00 in the afternoon.  He does drink a diet soda but does not  actually eat during that time and does not eat again until dinnertime.  I do wonder if he could be having an element of hypoglycemia though it does seem to be more related to low blood pressure though he has completely discontinued his lisinopril at this point.      Relevant Medications   simvastatin (ZOCOR) 40 MG tablet   Empagliflozin-metFORMIN HCl (SYNJARDY) 12.5-500 MG TABS   Other Relevant Orders   POCT glycosylated hemoglobin (Hb A1C) (Completed)   COMPLETE METABOLIC PANEL WITH GFR   Lipid panel   TSH   PSA   CBC   Diabetic peripheral neuropathy (HCC)   Relevant Medications   simvastatin (ZOCOR) 40 MG tablet   Empagliflozin-metFORMIN HCl (SYNJARDY) 12.5-500 MG TABS   Other Relevant Orders   COMPLETE METABOLIC PANEL WITH GFR   Lipid panel   TSH   PSA   CBC   CKD stage 3 due to type 2 diabetes mellitus (HCC)   Relevant Medications   simvastatin (ZOCOR) 40 MG tablet   Empagliflozin-metFORMIN HCl (SYNJARDY) 12.5-500 MG TABS   Other Relevant Orders   COMPLETE METABOLIC PANEL WITH GFR   Lipid panel   TSH   PSA   CBC     Other   Shortness of breath on exertion    He continues to smoke 2 packs/day and is not planning on quitting.  He does not  require oxygen.  And feels like he is able to do the activities that he is interested in doing daily.      Screening for prostate cancer   Relevant Orders   COMPLETE METABOLIC PANEL WITH GFR   Lipid panel   TSH   PSA   CBC   Insomnia    He reports that he is actually doing really well with his trazodone he would like to continue it in fact he says he gets about 10 hours of sleep on it which seems a little bit excessive but he says he goes to bed and is able to fall asleep without difficulty and he wakes up really well rested.  He says this made a huge difference in his chronic nightmares that he was experiencing.  He also feels like it keeps him from dwelling on the past      Hyperlipidemia   Relevant Medications   simvastatin  (ZOCOR) 40 MG tablet   Other Relevant Orders   COMPLETE METABOLIC PANEL WITH GFR   Lipid panel   TSH   PSA   CBC    Other Visit Diagnoses    Postural dizziness with near syncope       Relevant Medications   simvastatin (ZOCOR) 40 MG tablet   Other Relevant Orders   ECHOCARDIOGRAM COMPLETE      Hypotension-see note above about checking blood sugar when this happens again avoid excessive bending and standing quickly.  Making sure you are staying hydrated.  And now off of the ACE inhibitor.  Consider referral to cardiology for further evaluation.  Consider echocardiogram to rule out significant aortic stenosis.  Meds ordered this encounter  Medications  . simvastatin (ZOCOR) 40 MG tablet    Sig: Take 1 tablet (40 mg total) by mouth at bedtime.    Dispense:  90 tablet    Refill:  3  . Empagliflozin-metFORMIN HCl (SYNJARDY) 12.5-500 MG TABS    Sig: Take 1 tablet by mouth 2 (two) times daily.    Dispense:  180 tablet    Refill:  1    Follow-up: Return in about 3 months (around 12/07/2019) for Diabetes follow-up.    Beatrice Lecher, MD

## 2019-09-07 NOTE — Assessment & Plan Note (Signed)
He would really like to get back on Synjardy he was on it for about a year and a half and eventually we stopped it because of insurance.  We will go ahead and restart Synjardy and sent to his mail order.  Hopefully we can decrease his insulin.  We also discussed with him checking his blood glucose level when he is out golfing it sounds like he normally eats breakfast around 9 and then goes golfing around noon to about 4:00 in the afternoon.  He does drink a diet soda but does not actually eat during that time and does not eat again until dinnertime.  I do wonder if he could be having an element of hypoglycemia though it does seem to be more related to low blood pressure though he has completely discontinued his lisinopril at this point.

## 2019-09-07 NOTE — Assessment & Plan Note (Signed)
He continues to smoke 2 packs/day and is not planning on quitting.  He does not require oxygen.  And feels like he is able to do the activities that he is interested in doing daily.

## 2019-09-07 NOTE — Telephone Encounter (Signed)
Pt advised and is ok w/having the Echo of his heart done.

## 2019-09-10 LAB — COMPLETE METABOLIC PANEL WITH GFR
AG Ratio: 1.7 (calc) (ref 1.0–2.5)
ALT: 22 U/L (ref 9–46)
AST: 16 U/L (ref 10–35)
Albumin: 4.5 g/dL (ref 3.6–5.1)
Alkaline phosphatase (APISO): 52 U/L (ref 35–144)
BUN/Creatinine Ratio: 12 (calc) (ref 6–22)
BUN: 17 mg/dL (ref 7–25)
CO2: 28 mmol/L (ref 20–32)
Calcium: 9.9 mg/dL (ref 8.6–10.3)
Chloride: 106 mmol/L (ref 98–110)
Creat: 1.37 mg/dL — ABNORMAL HIGH (ref 0.70–1.18)
GFR, Est African American: 59 mL/min/{1.73_m2} — ABNORMAL LOW (ref >=60)
GFR, Est Non African American: 51 mL/min/{1.73_m2} — ABNORMAL LOW (ref >=60)
Globulin: 2.6 g/dL (calc) (ref 1.9–3.7)
Glucose, Bld: 154 mg/dL — ABNORMAL HIGH (ref 65–99)
Potassium: 4.9 mmol/L (ref 3.5–5.3)
Sodium: 140 mmol/L (ref 135–146)
Total Bilirubin: 0.3 mg/dL (ref 0.2–1.2)
Total Protein: 7.1 g/dL (ref 6.1–8.1)

## 2019-09-10 LAB — CBC
HCT: 41.8 % (ref 38.5–50.0)
Hemoglobin: 14.5 g/dL (ref 13.2–17.1)
MCH: 33.2 pg — ABNORMAL HIGH (ref 27.0–33.0)
MCHC: 34.7 g/dL (ref 32.0–36.0)
MCV: 95.7 fL (ref 80.0–100.0)
MPV: 11.3 fL (ref 7.5–12.5)
Platelets: 168 10*3/uL (ref 140–400)
RBC: 4.37 10*6/uL (ref 4.20–5.80)
RDW: 11.4 % (ref 11.0–15.0)
WBC: 6.7 10*3/uL (ref 3.8–10.8)

## 2019-09-10 LAB — LIPID PANEL
Cholesterol: 166 mg/dL (ref <200)
HDL: 46 mg/dL (ref >=40)
LDL Cholesterol (Calc): 93 mg/dL (calc)
Non-HDL Cholesterol (Calc): 120 mg/dL (calc) (ref <130)
Total CHOL/HDL Ratio: 3.6 (calc) (ref <5.0)
Triglycerides: 175 mg/dL — ABNORMAL HIGH (ref <150)

## 2019-09-10 LAB — PSA: PSA: 0.2 ng/mL (ref <=4.0)

## 2019-09-10 LAB — T4, FREE: Free T4: 1.1 ng/dL (ref 0.8–1.8)

## 2019-09-10 LAB — TSH: TSH: 7.07 mIU/L — ABNORMAL HIGH (ref 0.40–4.50)

## 2019-09-14 ENCOUNTER — Other Ambulatory Visit: Payer: Self-pay

## 2019-09-14 ENCOUNTER — Ambulatory Visit (HOSPITAL_BASED_OUTPATIENT_CLINIC_OR_DEPARTMENT_OTHER)
Admission: RE | Admit: 2019-09-14 | Discharge: 2019-09-14 | Disposition: A | Discharge status: Home / Self Care | Payer: Medicare Other | Source: Ambulatory Visit | Attending: Family Medicine | Admitting: Family Medicine

## 2019-09-14 DIAGNOSIS — R55 Syncope and collapse: Secondary | ICD-10-CM | POA: Diagnosis not present

## 2019-09-14 DIAGNOSIS — R42 Dizziness and giddiness: Secondary | ICD-10-CM | POA: Diagnosis not present

## 2019-09-14 NOTE — Progress Notes (Signed)
  Echocardiogram 2D Echocardiogram has been performed.  Jon [name] 09/14/2019, 9:47 AM

## 2019-09-20 ENCOUNTER — Other Ambulatory Visit: Payer: Self-pay | Admitting: *Deleted

## 2019-09-20 MED ORDER — KETOCONAZOLE 2 % EX CREA: TOPICAL_CREAM | CUTANEOUS | 2 refills | Status: DC

## 2019-09-20 MED ORDER — SIMVASTATIN 40 MG PO TABS: 40.0000 mg | ORAL_TABLET | Freq: Every day | ORAL | 3 refills | Status: DC

## 2019-09-27 ENCOUNTER — Ambulatory Visit: Payer: Medicare Other | Attending: Internal Medicine

## 2019-09-27 DIAGNOSIS — Z23 Encounter for immunization: Secondary | ICD-10-CM

## 2019-09-27 NOTE — Progress Notes (Signed)
   Covid-19 Vaccination Clinic  Name:  Jon [name]    MRN: [id number] DOB: Sep 18, 1945  09/27/2019  [name] was observed post Covid-19 immunization for 15 minutes without incident. He was provided with Vaccine Information Sheet and instruction to access the V-Safe system.   [name] was instructed to call 911 with any severe reactions post vaccine: [name] Difficulty breathing  . Swelling of face and throat  . A fast heartbeat  . A bad rash all over body  . Dizziness and weakness   Immunizations Administered    Name Date Dose VIS Date Route   Pfizer COVID-19 Vaccine 09/27/2019  8:31 AM 0.3 mL 07/06/2018 Intramuscular   Manufacturer: [place]   Lot: [id number]   [place]: [id number]

## 2019-10-14 DIAGNOSIS — R42 Dizziness and giddiness: Secondary | ICD-10-CM | POA: Diagnosis not present

## 2019-10-17 ENCOUNTER — Other Ambulatory Visit: Payer: Self-pay | Admitting: Otolaryngology

## 2019-10-17 DIAGNOSIS — R42 Dizziness and giddiness: Secondary | ICD-10-CM

## 2019-10-20 ENCOUNTER — Telehealth: Payer: Self-pay | Admitting: Family Medicine

## 2019-10-20 NOTE — Telephone Encounter (Signed)
Patient wanted to know if he could be worked in sooner than the first available 40 min appointment. Please advise.

## 2019-10-20 NOTE — Telephone Encounter (Signed)
Ok to put in  At 11:30 appt. Just move the same day acute.

## 2019-10-21 NOTE — Telephone Encounter (Signed)
Appointment has been made. No further questions at this time.  

## 2019-10-24 ENCOUNTER — Ambulatory Visit (INDEPENDENT_AMBULATORY_CARE_PROVIDER_SITE_OTHER): Payer: Medicare Other | Admitting: Family Medicine

## 2019-10-24 ENCOUNTER — Encounter: Payer: Self-pay | Admitting: Family Medicine

## 2019-10-24 ENCOUNTER — Ambulatory Visit
Admission: RE | Admit: 2019-10-24 | Discharge: 2019-10-24 | Disposition: A | Discharge status: Home / Self Care | Payer: Medicare Other | Source: Ambulatory Visit | Attending: Otolaryngology | Admitting: Otolaryngology

## 2019-10-24 ENCOUNTER — Other Ambulatory Visit: Payer: Self-pay

## 2019-10-24 VITALS — BP 115/60 | HR 86 | Ht 73.0 in | Wt 215.0 lb

## 2019-10-24 DIAGNOSIS — I6523 Occlusion and stenosis of bilateral carotid arteries: Secondary | ICD-10-CM | POA: Diagnosis not present

## 2019-10-24 DIAGNOSIS — L0501 Pilonidal cyst with abscess: Secondary | ICD-10-CM

## 2019-10-24 DIAGNOSIS — L723 Sebaceous cyst: Secondary | ICD-10-CM

## 2019-10-24 DIAGNOSIS — R42 Dizziness and giddiness: Secondary | ICD-10-CM | POA: Diagnosis not present

## 2019-10-24 DIAGNOSIS — E118 Type 2 diabetes mellitus with unspecified complications: Secondary | ICD-10-CM | POA: Diagnosis not present

## 2019-10-24 MED ORDER — DOXYCYCLINE HYCLATE 100 MG PO TABS: 100.0000 mg | ORAL_TABLET | Freq: Two times a day (BID) | ORAL | 0 refills | Status: DC

## 2019-10-24 NOTE — Progress Notes (Signed)
Acute Office Visit  Subjective:    Patient ID: Jon [name], male    DOB: 08-Jun-1945, 74 y.o.   MRN: [id number]  Chief Complaint  Patient presents with  . Cyst    HPI Patient is in today for lesion on his lower abdomen and one at the top of buttock crease.  Has been flared for about 3 weeks.    Says he was diagnosed with a pilonidal cyst when he was about 74 years old.  It required surgical excision with allowing it to heal by secondary intention with packing.  2 years later he said he had a repeat surgery because of recurrence.  He says he has not had any issues until recently when along the buttock crack a little bit to the right side he noticed a little bit of swelling and tenderness.  He says it actually has gotten a little bit better but again has been going on for about 3 weeks.  Says also around that time he was diagnosed with a carbuncle in the right lower abdomen over the pubis symphysis area.  He says it was about the size of the end of his thumb and had a large whitehead on it.  He is been doing some hot compresses and since then it actually has gone down he thinks it is about half the size that it was previously he says it never drained.  He also wanted let me know that he did stop his Synjardy since stopping the [place] he feels like some of the dizziness has actually improved.  He still feels like he is urinating frequently but maybe not quite as often.  He has been taking just the plain Metformin.  He says his blood sugars have actually been running consistently under 120 off of the Synjardy.  Past Medical History:  Diagnosis Date  . BENIGN POSITIONAL VERTIGO 05/20/2010   Qualifier: Diagnosis of  By: Madilyn Fireman MD, Barnetta Chapel    . Diabetes mellitus   . Epididymitis   . Hyperlipemia   . Hypertension   . Stroke [facility]) 7/11    Past Surgical History:  Procedure Laterality Date  . PILONIDAL CYST EXCISION  1960, 61, 62, 63  . VASECTOMY      Family History  Problem  Relation Age of Onset  . Diabetes Other        family hx of  . Colon cancer Neg Hx     Social History   Socioeconomic History  . Marital status: Divorced    Spouse name: Not on file  . Number of children: 2  . Years of education: Not on file  . Highest education level: Not on file  Occupational History  . Occupation: Retired  Tobacco Use  . Smoking status: Current Every Day Smoker    Packs/day: 2.00    Years: 58.00    Pack years: 116.00    Types: Cigarettes  . Smokeless tobacco: Never Used  . Tobacco comment: Counseling sheet given in exam room 07-2011   Substance and Sexual Activity  . Alcohol use: No  . Drug use: No  . Sexual activity: Not on file  Other Topics Concern  . Not on file  Social History Narrative   Daily caffeine    Social Determinants of Health   Financial Resource Strain:   . Difficulty of Paying Living Expenses:   Food Insecurity:   . Worried About Charity fundraiser in the Last Year:   . Arboriculturist in  the Last Year:   Transportation Needs:   . Film/video editor (Medical):   [name] Lack of Transportation (Non-Medical):   Physical Activity:   . Days of Exercise per Week:   . Minutes of Exercise per Session:   Stress:   . Feeling of Stress :   Social Connections:   . Frequency of Communication with Friends and Family:   . Frequency of Social Gatherings with Friends and Family:   . Attends Religious Services:   . Active Member of Clubs or Organizations:   . Attends Archivist Meetings:   [name] Marital Status:   Intimate Partner Violence:   . Fear of Current or Ex-Partner:   . Emotionally Abused:   [name] Physically Abused:   . Sexually Abused:     Outpatient Medications Prior to Visit  Medication Sig Dispense Refill  . Blood Glucose Monitoring Suppl (ACCU-CHEK AVIVA PLUS) w/Device KIT Check fasting blood sugar daily and before meals. DM Diagnosis 1 kit prn  . clopidogrel (PLAVIX) 75 MG tablet TAKE 1 TABLET BY MOUTH  DAILY 90 tablet  3  . fish oil-omega-3 fatty acids 1000 MG capsule Take 4 g by mouth daily.    [name] glucose blood (ACCU-CHEK AVIVA PLUS) test strip USE ONE STRIP TO CHECK BLOOD SUGAR TWICE DAILY AS DIRECTED. Dx: E11.9 200 each 6  . Insulin Detemir (LEVEMIR FLEXTOUCH) 100 UNIT/ML Pen INJECT 10-20 UNITS INTO THE SKIN DAILY AT 10 PM. 30 mL 3  . Insulin Pen Needle (B-D ULTRAFINE III SHORT PEN) 31G X 8 MM MISC INJECT INTO SKIN DAILY. 100 each 3  . ketoconazole (NIZORAL) 2 % cream APPLY CREAM TOPICALLY TO AFFECTED AREA ONCE DAILY FOR 30 DAYS 15 g 2  . metFORMIN (GLUMETZA) 1000 MG (MOD) 24 hr tablet Take 1,000 mg by mouth daily with breakfast.    . simvastatin (ZOCOR) 40 MG tablet Take 1 tablet (40 mg total) by mouth at bedtime. 90 tablet 3  . traZODone (DESYREL) 50 MG tablet TAKE 1/2 TO 1 TABLET BY  MOUTH AT BEDTIME AS NEEDED  FOR SLEEP 90 tablet 3  . Empagliflozin-metFORMIN HCl (SYNJARDY) 12.5-500 MG TABS Take 1 tablet by mouth 2 (two) times daily. 180 tablet 1   No facility-administered medications prior to visit.    Allergies  Allergen Reactions  . Gabapentin Other (See Comments)    Nightmares.   . Lisinopril Other (See Comments)    Hypotension    Review of Systems     Objective:    Physical Exam Vitals reviewed.  Constitutional:      Appearance: He is well-developed.  HENT:     Head: Normocephalic and atraumatic.  Eyes:     Conjunctiva/sclera: Conjunctivae normal.  Cardiovascular:     Rate and Rhythm: Normal rate.  Pulmonary:     Effort: Pulmonary effort is normal.  Skin:    General: Skin is dry.     Coloration: Skin is not pale.     Comments:  On the right lower groin near the pubis symphysis there is an approximately 1-1/2 to 2 cm lesion underneath the skin there is a little bit of skin peeling on the surface but no open wound or drainage no significant erythremia.  It is tender on exam.   Lesion on the right buttock crease with some mild erythema and induration. No pustule or active  drainage.   Scar well healed.    Neurological:     Mental Status: He is alert and oriented to person, place,  and time.  Psychiatric:        Behavior: Behavior normal.     BP 115/60   Pulse 86   Ht '6\' 1"'$  (1.854 m)   Wt 215 lb (97.5 kg)   SpO2 96%   BMI 28.37 kg/m  Wt Readings from Last 3 Encounters:  10/24/19 215 lb (97.5 kg)  09/07/19 215 lb (97.5 kg)  02/09/19 215 lb (97.5 kg)    Health Maintenance Due  Topic Date Due  . URINE MICROALBUMIN  05/19/2019  . FOOT EXAM  10/01/2019    There are no preventive care reminders to display for this patient.   Lab Results  Component Value Date   TSH 7.07 (H) 09/07/2019   Lab Results  Component Value Date   WBC 6.7 09/07/2019   HGB 14.5 09/07/2019   HCT 41.8 09/07/2019   MCV 95.7 09/07/2019   PLT 168 09/07/2019   Lab Results  Component Value Date   NA 140 09/07/2019   K 4.9 09/07/2019   CO2 28 09/07/2019   GLUCOSE 154 (H) 09/07/2019   BUN 17 09/07/2019   CREATININE 1.37 (H) 09/07/2019   BILITOT 0.3 09/07/2019   ALKPHOS 63 12/25/2015   AST 16 09/07/2019   ALT 22 09/07/2019   PROT 7.1 09/07/2019   ALBUMIN 4.4 12/25/2015   CALCIUM 9.9 09/07/2019   Lab Results  Component Value Date   CHOL 166 09/07/2019   Lab Results  Component Value Date   HDL 46 09/07/2019   Lab Results  Component Value Date   LDLCALC 93 09/07/2019   Lab Results  Component Value Date   TRIG 175 (H) 09/07/2019   Lab Results  Component Value Date   CHOLHDL 3.6 09/07/2019   Lab Results  Component Value Date   HGBA1C 7.7 (A) 09/07/2019       Assessment & Plan:   Problem List Items Addressed This Visit      Endocrine   Controlled diabetes mellitus type 2 with complications ([place])    So far doing well on just plain Metformin.  Discontinued Synjardy I do think that the flows in could have been dropping his blood pressure and contributing it does work as an eye diuretic and we discussed that today.      Relevant Medications    metFORMIN (GLUMETZA) 1000 MG (MOD) 24 hr tablet    Other Visit Diagnoses    Inflamed sebaceous cyst    -  Primary   Pilonidal cyst with abscess         Inflamed sebaceous cyst-it sounds like it is actually starting to resolve on its own.  We will go ahead and put him on some doxycycline if it does not resolve completely we discussed that it would need to be excised to get completely rid of the lesion.  Pilonidal cyst with abscess-he has had 2 surgeries as a teenager.  Certainly this could be recurrence.  Again we will go ahead and place him on doxycycline if it does not completely resolve then will need consultation with a general surgeon for further evaluation and treatment.  Meds ordered this encounter  Medications  . doxycycline (VIBRA-TABS) 100 MG tablet    Sig: Take 1 tablet (100 mg total) by mouth 2 (two) times daily.    Dispense:  20 tablet    Refill:  0     Beatrice Lecher, MD

## 2019-10-24 NOTE — Assessment & Plan Note (Signed)
So far doing well on just plain Metformin.  Discontinued Synjardy I do think that the flows in could have been dropping his blood pressure and contributing it does work as an eye diuretic and we discussed that today.

## 2019-10-26 ENCOUNTER — Ambulatory Visit: Payer: Medicare Other | Admitting: Family Medicine

## 2019-10-26 ENCOUNTER — Telehealth: Payer: Self-pay | Admitting: Family Medicine

## 2019-10-26 NOTE — Telephone Encounter (Signed)
Please call pt: [place] results of the carotid arteries show an abnormality of the left vertebral artery.  It may indicate a narrowing of a artery in your upper chest called the subclavian artery. They recommend a CT angio of the chest to evaluate this further.

## 2019-10-27 ENCOUNTER — Other Ambulatory Visit: Payer: Self-pay | Admitting: Family Medicine

## 2019-10-27 NOTE — Telephone Encounter (Signed)
Jon [name] states he is going to go to the Neurologist on July 12 th. He states he will follow back up with Dr Madilyn Fireman after this appointment.

## 2019-10-28 DIAGNOSIS — E119 Type 2 diabetes mellitus without complications: Secondary | ICD-10-CM | POA: Diagnosis not present

## 2019-10-28 NOTE — Telephone Encounter (Signed)
OK 

## 2019-10-31 LAB — HM DIABETES EYE EXAM

## 2019-11-03 ENCOUNTER — Other Ambulatory Visit: Payer: Self-pay | Admitting: Family Medicine

## 2019-11-09 ENCOUNTER — Encounter: Payer: Self-pay | Admitting: Family Medicine

## 2019-11-16 ENCOUNTER — Telehealth: Payer: Self-pay

## 2019-11-16 NOTE — Telephone Encounter (Signed)
Jon [name] states he has been fighting low blood pressure (70/50) and dizziness for a while. He read that Trazodone could cause low blood pressure. Friday he stopped taking the Trazodone 50 mg for 3 days. The following days his blood pressure was 120/80. His daughter's friend said he should not stop taking the Trazodone without stepping down. So he took 2 Trazodone 25 mg for two nights. His blood pressure following was 90/60.  1st - He wanted to know if he can stop taking the Trazodone without stepping down.  2nd - Can he replace it with OTC Unisom?

## 2019-11-17 NOTE — Telephone Encounter (Signed)
Patient advised.

## 2019-11-17 NOTE — Telephone Encounter (Signed)
Okay to go ahead and just stop the trazodone since he is taking a fairly low dose.  And yes okay to use over-the-counter Unisom.

## 2019-11-21 ENCOUNTER — Ambulatory Visit (INDEPENDENT_AMBULATORY_CARE_PROVIDER_SITE_OTHER): Payer: Medicare Other | Admitting: Vascular Surgery

## 2019-11-21 ENCOUNTER — Encounter (INDEPENDENT_AMBULATORY_CARE_PROVIDER_SITE_OTHER): Payer: Self-pay | Admitting: Vascular Surgery

## 2019-11-21 ENCOUNTER — Other Ambulatory Visit: Payer: Self-pay

## 2019-11-21 VITALS — BP 92/63 | HR 86 | Resp 16 | Ht 73.0 in | Wt 212.0 lb

## 2019-11-21 DIAGNOSIS — E118 Type 2 diabetes mellitus with unspecified complications: Secondary | ICD-10-CM | POA: Diagnosis not present

## 2019-11-21 DIAGNOSIS — E782 Mixed hyperlipidemia: Secondary | ICD-10-CM | POA: Diagnosis not present

## 2019-11-21 DIAGNOSIS — J432 Centrilobular emphysema: Secondary | ICD-10-CM

## 2019-11-21 DIAGNOSIS — I7090 Unspecified atherosclerosis: Secondary | ICD-10-CM | POA: Diagnosis not present

## 2019-11-21 NOTE — Progress Notes (Signed)
MRN : [id number]  Jon [name] is a 74 y.o. (Oct 05, 1945) male who presents with chief complaint of  Chief Complaint  Patient presents with  . New Patient (Initial Visit)    ref [place] left subclavian artery stenosis  .  History of Present Illness:   The patient is seen for evaluation of carotid stenosis in association with >20 mmHg difference in SBP of the arms.   He denies vertebral basilar symptoms and he denies left arm pain or left arm claudication symptoms.  The patient denies amaurosis fugax. There is no recent history of TIA symptoms or focal motor deficits. There is no prior documented CVA.  There is no history of migraine headaches. There is no history of seizures.  The patient is taking enteric-coated aspirin 81 mg daily.  The patient has a history of coronary artery disease, no recent episodes of angina or shortness of breath. The patient denies PAD or claudication symptoms. There is a history of hyperlipidemia which is being treated with a statin.   Duplex ultrasound of the carotid arteries on 10/24/2019 shows mild to moderate carotid plaque bilaterally in association with reversal of flow in the left vertebral that is consistent with a hemodynamically significant stenosis of the left subclavian.   Current Meds  Medication Sig  . Blood Glucose Monitoring Suppl (ACCU-CHEK AVIVA PLUS) w/Device KIT Check fasting blood sugar daily and before meals. DM Diagnosis  . clopidogrel (PLAVIX) 75 MG tablet TAKE 1 TABLET BY MOUTH  DAILY  . fish oil-omega-3 fatty acids 1000 MG capsule Take 4 g by mouth daily.  [name] glucose blood (ACCU-CHEK AVIVA PLUS) test strip USE ONE STRIP TO CHECK  BLOOD SUGAR TWICE DAILY AS  DIRECTED  . Insulin Pen Needle (B-D ULTRAFINE III SHORT PEN) 31G X 8 MM MISC INJECT INTO SKIN DAILY.  [name] ketoconazole (NIZORAL) 2 % cream APPLY CREAM TOPICALLY TO AFFECTED AREA ONCE DAILY FOR 30 DAYS  . LEVEMIR FLEXTOUCH 100 UNIT/ML FlexPen INJECT 10-20 UNITS INTO THE  SKIN DAILY AT 10 PM.  . metFORMIN (GLUMETZA) 1000 MG (MOD) 24 hr tablet Take 1,000 mg by mouth daily with breakfast.  . simvastatin (ZOCOR) 40 MG tablet Take 1 tablet (40 mg total) by mouth at bedtime.  . traZODone (DESYREL) 50 MG tablet TAKE 1/2 TO 1 TABLET BY  MOUTH AT BEDTIME AS NEEDED  FOR SLEEP    Past Medical History:  Diagnosis Date  . BENIGN POSITIONAL VERTIGO 05/20/2010   Qualifier: Diagnosis of  By: Madilyn Fireman MD, Barnetta Chapel    . Diabetes mellitus   . Epididymitis   . Hyperlipemia   . Hypertension   . Stroke [facility]) 7/11    Past Surgical History:  Procedure Laterality Date  . PILONIDAL CYST EXCISION  1960, 61, 62, 63  . VASECTOMY      Social History Social History   Tobacco Use  . Smoking status: Current Every Day Smoker    Packs/day: 2.00    Years: 58.00    Pack years: 116.00    Types: Cigarettes  . Smokeless tobacco: Never Used  . Tobacco comment: Counseling sheet given in exam room 07-2011   Substance Use Topics  . Alcohol use: No  . Drug use: No    Family History Family History  Problem Relation Age of Onset  . Diabetes Other        family hx of  . Colon cancer Neg Hx   No family history of bleeding/clotting disorders, porphyria or autoimmune disease  Allergies  Allergen Reactions  . Gabapentin Other (See Comments)    Nightmares.   . Lisinopril Other (See Comments)    Hypotension     REVIEW OF SYSTEMS (Negative unless checked)  Constitutional: _0 Weight loss  _1 Fever  _2 Chills Cardiac: _3 Chest pain   _4 Chest pressure   _5 Palpitations   _6 Shortness of breath when laying flat   _7 Shortness of breath with exertion. Vascular:  _8 Pain in legs with walking   _9 Pain in legs at rest  _10 History of DVT   _11 Phlebitis   _12 Swelling in legs   _13 Varicose veins   _14 Non-healing ulcers Pulmonary:   _15 Uses home oxygen   _16 Productive cough   _17 Hemoptysis   _18 Wheeze  _19 COPD   _20 Asthma Neurologic:  _21 Dizziness   _22 Seizures   _23 History of stroke   _24 History of TIA   _25 Aphasia   _26 Vissual changes   _27 Weakness or numbness in arm   _28 Weakness or numbness in leg Musculoskeletal:   _29 Joint swelling   _30 Joint pain   _31 Low back pain Hematologic:  _32 Easy bruising  _33 Easy bleeding   _34 Hypercoagulable state   _35 Anemic Gastrointestinal:  _36 Diarrhea   _37 Vomiting  _38 Gastroesophageal reflux/heartburn   _39 Difficulty swallowing. Genitourinary:  _40 Chronic kidney disease   _41 Difficult urination  _42 Frequent urination   _43 Blood in urine Skin:  _44 Rashes   _45 Ulcers  Psychological:  _46 History of anxiety   _47  History of major depression.  Physical Examination  Vitals:   11/21/19 1312 11/21/19 1313  BP: (!) 145/80 92/63  Pulse: 86   Resp: 16   Weight: 212 lb (96.2 kg)   Height: _48  (1.854 m)    Body mass index is 27.97 kg/m. Gen: WD/WN, NAD Head: [place]/AT, No temporalis wasting.  Ear/Nose/Throat: Hearing grossly intact, nares w/o erythema or drainage, poor dentition Eyes: PER, EOMI, sclera nonicteric.  Neck: Supple, no masses.  No bruit or JVD.  Pulmonary:  Good air movement, clear to auscultation bilaterally, no use of accessory muscles.  Cardiac: RRR, normal S1, S2, no Murmurs. Vascular: left carotid bruit noted Vessel Right Left  Radial Palpable Not Palpable  Ulnar Palpable Not Palpable  Brachial Palpable Trace Palpable  Carotid Palpable Palpable  Gastrointestinal: soft, non-distended. No guarding/no peritoneal signs.  Musculoskeletal: M/S 5/5 throughout.  No deformity or atrophy.  Neurologic: CN 2-12 intact. Pain and light touch intact in extremities.  Symmetrical.  Speech is fluent. Motor exam as listed above. Psychiatric: Judgment intact, Mood & affect appropriate for pt's clinical situation. Dermatologic: No rashes or ulcers noted.  No changes consistent with cellulitis.  CBC Lab Results  Component Value Date   WBC 6.7 09/07/2019   HGB 14.5 09/07/2019   HCT 41.8 09/07/2019   MCV 95.7 09/07/2019   PLT 168 09/07/2019    BMET    Component Value  Date/Time   NA 140 09/07/2019 0033   K 4.9 09/07/2019 0033   CL 106 09/07/2019 0033   CO2 28 09/07/2019 0033   GLUCOSE 154 (H) 09/07/2019 0033   BUN 17 09/07/2019 0033   CREATININE 1.37 (H) 09/07/2019 0033   CALCIUM 9.9 09/07/2019 0033   GFRNONAA 51 (L) 09/07/2019 0033   GFRAA 59 (L) 09/07/2019 0033   CrCl cannot be calculated (Patient's most recent lab result is older than the maximum 21 days allowed.).  COAG No results found for: INR, PROTIME  Radiology US Carotid Bilateral  Result Date: 10/24/2019 CLINICAL DATA:  74 year old male with a history of dizziness EXAM: BILATERAL CAROTID DUPLEX ULTRASOUND TECHNIQUE: Pearline Cables scale imaging, color Doppler and duplex ultrasound  were performed of bilateral carotid and vertebral arteries in the neck. COMPARISON:  None. FINDINGS: Criteria: Quantification of carotid stenosis is based on velocity parameters that correlate the residual internal carotid diameter with NASCET-based stenosis levels, using the diameter of the distal internal carotid lumen as the denominator for stenosis measurement. The following velocity measurements were obtained: RIGHT ICA:  Systolic 76 cm/sec, Diastolic 23 cm/sec CCA:  77 cm/sec SYSTOLIC ICA/CCA RATIO:  1.0 ECA:  63 cm/sec LEFT ICA:  Systolic 63 cm/sec, Diastolic 15 cm/sec CCA:  99 cm/sec SYSTOLIC ICA/CCA RATIO:  0.6 ECA:  124 cm/sec Right Brachial SBP: 121 Left Brachial SBP: 109 RIGHT CAROTID ARTERY: No significant calcified disease of the right common carotid artery. Intermediate waveform maintained. Heterogeneous plaque without significant calcifications at the right carotid bifurcation. Low resistance waveform of the right ICA. No significant tortuosity. RIGHT VERTEBRAL ARTERY: Antegrade flow with low resistance waveform. LEFT CAROTID ARTERY: No significant calcified disease of the left common carotid artery. Intermediate waveform maintained. Heterogeneous plaque at the left carotid bifurcation without significant  calcifications. Low resistance waveform of the left ICA. LEFT VERTEBRAL ARTERY: Reversal of the left vertebral waveform. IMPRESSION: Color duplex indicates minimal heterogeneous plaque, with no hemodynamically significant stenosis by duplex criteria in the extracranial cerebrovascular circulation. Reversal of the left vertebral artery, suggesting a proximal left subclavian artery stenosis. Office based repeat bilateral upper extremity blood pressure assessment may be useful, as there is a borderline significant differential measured on today's study. Alternatively, CT angiogram of the chest to include the aortic arch may be considered if further workup is indicated. Signed, Dulcy Fanny. Dellia Nims, RPVI [facility] Radiology Electronically Signed   By: Corrie Mckusick D.O.   On: 10/24/2019 12:43     Assessment/Plan 1. Atherosclerotic vascular disease Recommend:  I do not find evidence of life style limiting vascular disease. The patient specifically denies life style limitation of the left arm.  Previous noninvasive studies including duplex ultrasound of the carotid arteries do not identify critical vascular problems.  The patient should continue walking and begin a more formal exercise program. The patient should continue his antiplatelet therapy and aggressive treatment of the lipid abnormalities.  Patient will follow-up with me on a PRN basis   2. Controlled type 2 diabetes mellitus with complication, without long-term current use of insulin (HCC) Continue hypoglycemic medications as already ordered, these medications have been reviewed and there are no changes at this time.  Hgb A1C to be monitored as already arranged by primary service   3. Mixed hyperlipidemia Continue statin as ordered and reviewed, no changes at this time   4. Centrilobular emphysema (HCC) Continue pulmonary medications and aerosols as already ordered, these medications  have been reviewed and there are no changes at this time.     [name], MD  11/21/2019 1:22 PM

## 2019-11-22 ENCOUNTER — Other Ambulatory Visit: Payer: Self-pay | Admitting: *Deleted

## 2019-11-23 ENCOUNTER — Ambulatory Visit: Payer: Medicare Other | Admitting: Podiatry

## 2019-11-23 ENCOUNTER — Other Ambulatory Visit: Payer: Self-pay

## 2019-11-23 MED ORDER — METFORMIN HCL 1000 MG PO TABS
1000.0000 mg | ORAL_TABLET | Freq: Two times a day (BID) | ORAL | 1 refills | Status: DC
Start: 2019-11-23 — End: 2020-03-07

## 2019-11-27 ENCOUNTER — Encounter (INDEPENDENT_AMBULATORY_CARE_PROVIDER_SITE_OTHER): Payer: Self-pay | Admitting: Vascular Surgery

## 2019-12-07 ENCOUNTER — Ambulatory Visit (INDEPENDENT_AMBULATORY_CARE_PROVIDER_SITE_OTHER): Payer: Medicare Other | Admitting: Family Medicine

## 2019-12-07 ENCOUNTER — Other Ambulatory Visit: Payer: Self-pay

## 2019-12-07 VITALS — BP 95/48 | HR 73 | Ht 73.0 in | Wt 212.0 lb

## 2019-12-07 DIAGNOSIS — R7989 Other specified abnormal findings of blood chemistry: Secondary | ICD-10-CM | POA: Diagnosis not present

## 2019-12-07 DIAGNOSIS — I771 Stricture of artery: Secondary | ICD-10-CM | POA: Insufficient documentation

## 2019-12-07 DIAGNOSIS — E782 Mixed hyperlipidemia: Secondary | ICD-10-CM | POA: Diagnosis not present

## 2019-12-07 DIAGNOSIS — E118 Type 2 diabetes mellitus with unspecified complications: Secondary | ICD-10-CM | POA: Diagnosis not present

## 2019-12-07 LAB — POCT GLYCOSYLATED HEMOGLOBIN (HGB A1C): Hemoglobin A1C: 7.6 % — AB (ref 4.0–5.6)

## 2019-12-07 LAB — POCT UA - MICROALBUMIN
Albumin/Creatinine Ratio, Urine, POC: <30
Creatinine, POC: 50 mg/dL
Microalbumin Ur, POC: 10 mg/L

## 2019-12-07 MED ORDER — LEVEMIR FLEXTOUCH 100 UNIT/ML ~~LOC~~ SOPN: 40.0000 [IU] | PEN_INJECTOR | Freq: Every day | SUBCUTANEOUS | 1 refills | Status: DC

## 2019-12-07 MED ORDER — ATORVASTATIN CALCIUM 40 MG PO TABS: 40.0000 mg | ORAL_TABLET | Freq: Every day | ORAL | 3 refills | Status: DC

## 2019-12-07 NOTE — Assessment & Plan Note (Signed)
Will maximize  Control of his LDL. Goal < 70. Will change from simvastatin to atorvastation.  rEcheck lipids and liver in 6-8 weeks.

## 2019-12-07 NOTE — Assessment & Plan Note (Signed)
A1c similar to what it was in April no significant improvement.  7.6.  He actually did just go up on his Levemir to 40 units about 4 days ago.  So far he says blood sugars are doing a little better.  We will send over new prescription to pharmacy will stick with that dose for now.  Continue to work on healthy choices and regular exercise.

## 2019-12-07 NOTE — Progress Notes (Signed)
Established Patient Office Visit  Subjective:  Patient ID: Jon [name], male    DOB: [birth year]  Age: 74 y.o. MRN: [id number]  CC:  Chief Complaint  Patient presents with   Diabetes   Hypertension    HPI Jon [name] presents for diabetes.  Since stopping his Iva Boop he had felt like his dizziness had improved.  There was some concern that the diuretic effect may actually have been causing some hypotension and dehydration.  He is just been taking plain Metformin for his diabetes.  We ordered an echocardiogram on him in May with no worrisome findings.  EF was 60 to 65%.  He then went to see his ENT who recommended that he get a carotid Doppler as the ENT had ruled out positional vertigo.  Carotid Doppler performed on June 14 showed minimal heterogeneous plaque with no significant stenosis.  There was also noted reversal of the left vertebral artery suggesting proximal left subclavian artery stenosis.  They recommended bilateral upper extremity blood pressures and/or CT angio.  He was then referred to vascular surgery, Dr. Payton Mccallum at [facility] vein [facility] vascular.  He was seen on July 12.  Per the note did not recommend any specific intervention at this time.  Diagnosis consistent with subclavian artery stenosis.  They recommended more regular exercise as well as taking 81 mg aspirin and Plavix.  They also recommended maximizing control of his lipids.  He is not having claudication symptoms in his arms but continues to have recurrent dizzy spells.  Recent labs showed an abnormal TSH.  I had wanted to recheck those again this time.  Also recently saw him in mid June for an inflamed sebaceous cyst and pilonidal cyst with possible early abscess.  We treated with doxycycline and discussed referral to a general surgeon if it did not improve.  He did not want to have surgery again if it could be avoided at all.  US Carotid Bilateral (Accession [id number]) (Order [id number]) Imaging Date:  10/24/2019 Department: [employer] Released By: Jodene Nam D Authorizing: Carloyn Manner, MD  Exam Status  Status  Final [99]  PACS Intelerad Image Link  Show images for US Carotid Bilateral Study Result  Narrative & Impression  CLINICAL DATA:  74 year old male with a history of dizziness  EXAM: BILATERAL CAROTID DUPLEX ULTRASOUND  TECHNIQUE: Pearline Cables scale imaging, color Doppler and duplex ultrasound were performed of bilateral carotid and vertebral arteries in the neck.  COMPARISON:  None.  FINDINGS: Criteria: Quantification of carotid stenosis is based on velocity parameters that correlate the residual internal carotid diameter with NASCET-based stenosis levels, using the diameter of the distal internal carotid lumen as the denominator for stenosis measurement.  The following velocity measurements were obtained:  RIGHT  ICA:  Systolic 76 cm/sec, Diastolic 23 cm/sec  CCA:  77 cm/sec  SYSTOLIC ICA/CCA RATIO:  1.0  ECA:  63 cm/sec  LEFT  ICA:  Systolic 63 cm/sec, Diastolic 15 cm/sec  CCA:  99 cm/sec  SYSTOLIC ICA/CCA RATIO:  0.6  ECA:  124 cm/sec  Right Brachial SBP: 121  Left Brachial SBP: 109  RIGHT CAROTID ARTERY: No significant calcified disease of the right common carotid artery. Intermediate waveform maintained. Heterogeneous plaque without significant calcifications at the right carotid bifurcation. Low resistance waveform of the right ICA. No significant tortuosity.  RIGHT VERTEBRAL ARTERY: Antegrade flow with low resistance waveform.  LEFT CAROTID ARTERY: No significant calcified disease of the left common carotid artery. Intermediate waveform maintained.  Heterogeneous plaque at the left carotid bifurcation without significant calcifications. Low resistance waveform of the left ICA.  LEFT VERTEBRAL ARTERY: Reversal of the left vertebral waveform.  IMPRESSION: Color duplex  indicates minimal heterogeneous plaque, with no hemodynamically significant stenosis by duplex criteria in the extracranial cerebrovascular circulation.  Reversal of the left vertebral artery, suggesting a proximal left subclavian artery stenosis. Office based repeat bilateral upper extremity blood pressure assessment may be useful, as there is a borderline significant differential measured on today's study. Alternatively, CT angiogram of the chest to include the aortic arch may be considered if further workup is indicated.  Signed,  Dulcy Fanny. Dellia Nims, RPVI  [facility]  [facility]   Electronically Signed   By: Corrie Mckusick D.O.   On: 10/24/2019 12:43     Past Medical History:  Diagnosis Date   BENIGN POSITIONAL VERTIGO 05/20/2010   Qualifier: Diagnosis of  By: Madilyn Fireman MD, Teah Votaw     Diabetes mellitus    Epididymitis    Hyperlipemia    Hypertension    Stroke [facility]) 7/11    Past Surgical History:  Procedure Laterality Date   PILONIDAL CYST EXCISION  1960, 61, 62, 85   VASECTOMY      Family History  Problem Relation Age of Onset   Diabetes Other        family hx of   Colon cancer Neg Hx     Social History   Socioeconomic History   Marital status: Divorced    Spouse name: Not on file   Number of children: 2   Years of education: Not on file   Highest education level: Not on file  Occupational History   Occupation: Retired  Tobacco Use   Smoking status: Current Every Day Smoker    Packs/day: 2.00    Years: 58.00    Pack years: 116.00    Types: Cigarettes   Smokeless tobacco: Never Used   Tobacco comment: Counseling sheet given in exam room 07-2011   Substance and Sexual Activity   Alcohol use: No   Drug use: No   Sexual activity: Not on file  Other Topics Concern   Not on file  Social History Narrative   Daily caffeine    Social Determinants of Health   Financial  Resource Strain:    Difficulty of Paying Living Expenses:   Food Insecurity:    Worried About Charity fundraiser in the Last Year:    Arboriculturist in the Last Year:   Transportation Needs:    Film/video editor (Medical):    Lack of Transportation (Non-Medical):   Physical Activity:    Days of Exercise per Week:    Minutes of Exercise per Session:   Stress:    Feeling of Stress :   Social Connections:    Frequency of Communication with Friends and Family:    Frequency of Social Gatherings with Friends and Family:    Attends Religious Services:    Active Member of Clubs or Organizations:    Attends Music therapist:    Marital Status:   Intimate Partner Violence:    Fear of Current or Ex-Partner:    Emotionally Abused:    Physically Abused:    Sexually Abused:     Outpatient Medications Prior to Visit  Medication Sig Dispense Refill   Blood Glucose Monitoring Suppl (ACCU-CHEK AVIVA PLUS) w/Device KIT Check fasting blood sugar daily and before meals. DM Diagnosis 1 kit  prn   clopidogrel (PLAVIX) 75 MG tablet TAKE 1 TABLET BY MOUTH  DAILY 90 tablet 3   fish oil-omega-3 fatty acids 1000 MG capsule Take 4 g by mouth daily.     glucose blood (ACCU-CHEK AVIVA PLUS) test strip USE ONE STRIP TO CHECK  BLOOD SUGAR TWICE DAILY AS  DIRECTED 200 strip 3   Insulin Pen Needle (B-D ULTRAFINE III SHORT PEN) 31G X 8 MM MISC INJECT INTO SKIN DAILY. 100 each 3   ketoconazole (NIZORAL) 2 % cream APPLY CREAM TOPICALLY TO AFFECTED AREA ONCE DAILY FOR 30 DAYS 15 g 2   metFORMIN (GLUCOPHAGE) 1000 MG tablet Take 1 tablet (1,000 mg total) by mouth 2 (two) times daily with a meal. 180 tablet 1   simvastatin (ZOCOR) 40 MG tablet Take 1 tablet (40 mg total) by mouth at bedtime. 90 tablet 3   traZODone (DESYREL) 50 MG tablet TAKE 1/2 TO 1 TABLET BY  MOUTH AT BEDTIME AS NEEDED  FOR SLEEP 90 tablet 3   LEVEMIR FLEXTOUCH 100 UNIT/ML FlexPen INJECT 10-20 UNITS INTO  THE SKIN DAILY AT 10 PM. 30 mL 3   No facility-administered medications prior to visit.    Allergies  Allergen Reactions   Gabapentin Other (See Comments)    Nightmares.    Lisinopril Other (See Comments)    Hypotension    ROS Review of Systems    Objective:    Physical Exam Vitals reviewed.  Constitutional:      Appearance: He is well-developed.  HENT:     Head: Normocephalic and atraumatic.  Eyes:     Conjunctiva/sclera: Conjunctivae normal.  Cardiovascular:     Rate and Rhythm: Normal rate.  Pulmonary:     Effort: Pulmonary effort is normal.  Skin:    General: Skin is dry.     Coloration: Skin is not pale.  Neurological:     Mental Status: He is alert and oriented to person, place, and time.  Psychiatric:        Behavior: Behavior normal.     BP (!) 95/48 Comment: left arm   Pulse 73    Ht _0  (1.854 m)    Wt (!) 212 lb (96.2 kg)    SpO2 100%    BMI 27.97 kg/m  Wt Readings from Last 3 Encounters:  12/07/19 (!) 212 lb (96.2 kg)  11/21/19 212 lb (96.2 kg)  10/24/19 215 lb (97.5 kg)     Health Maintenance Due  Topic Date Due   INFLUENZA VACCINE  12/11/2019    There are no preventive care reminders to display for this patient.  Lab Results  Component Value Date   TSH 7.07 (H) 09/07/2019   Lab Results  Component Value Date   WBC 6.1 12/11/2019   HGB 14.6 12/11/2019   HCT 42.9 12/11/2019   MCV 97.5 12/11/2019   PLT 164 12/11/2019   Lab Results  Component Value Date   NA 138 12/11/2019   K 4.7 12/11/2019   CO2 25 12/11/2019   GLUCOSE 244 (H) 12/11/2019   BUN 17 12/11/2019   CREATININE 1.44 (H) 12/11/2019   BILITOT 0.5 12/11/2019   ALKPHOS 54 12/11/2019   AST 19 12/11/2019   ALT 23 12/11/2019   PROT 7.6 12/11/2019   ALBUMIN 4.5 12/11/2019   CALCIUM 9.3 12/11/2019   ANIONGAP 10 12/11/2019   Lab Results  Component Value Date   CHOL 166 09/07/2019   Lab Results  Component Value Date   HDL 46 09/07/2019  Lab Results   Component Value Date   LDLCALC 93 09/07/2019   Lab Results  Component Value Date   TRIG 175 (H) 09/07/2019   Lab Results  Component Value Date   CHOLHDL 3.6 09/07/2019   Lab Results  Component Value Date   HGBA1C 7.6 (A) 12/07/2019      Assessment & Plan:   Problem List Items Addressed This Visit      Cardiovascular and Mediastinum   Subclavian arterial stenosis ([place])    I am concerned that he still having some significant dizziness that is inhibiting his ability to do things like play golf and at times even drive.  Even though he is not having pain, weakness, or cramping in his left arm.  No claudication symptoms.  Continue Plavix.  They did recommend aspirin though he is not currently taking that.      Relevant Medications   atorvastatin (LIPITOR) 40 MG tablet     Endocrine   Controlled diabetes mellitus type 2 with complications ([place]) - Primary    A1c similar to what it was in April no significant improvement.  7.6.  He actually did just go up on his Levemir to 40 units about 4 days ago.  So far he says blood sugars are doing a little better.  We will send over new prescription to pharmacy will stick with that dose for now.  Continue to work on healthy choices and regular exercise.      Relevant Medications   atorvastatin (LIPITOR) 40 MG tablet   insulin detemir (LEVEMIR FLEXTOUCH) 100 UNIT/ML FlexPen   Other Relevant Orders   POCT glycosylated hemoglobin (Hb A1C) (Completed)   POCT UA - Microalbumin (Completed)     Other   Hyperlipidemia    Will maximize  Control of his LDL. Goal < 70. Will change from simvastatin to atorvastation.  rEcheck lipids and liver in 6-8 weeks.       Relevant Medications   atorvastatin (LIPITOR) 40 MG tablet   Abnormal TSH    Last TSH was elevated with a normal free to T4.  We will repeat labs today if it persists then most consistent with subclinical hypothyroidism will need to be monitored periodically.      Relevant Orders   TSH  + free T4      Meds ordered this encounter  Medications   atorvastatin (LIPITOR) 40 MG tablet    Sig: Take 1 tablet (40 mg total) by mouth daily.    Dispense:  90 tablet    Refill:  3   insulin detemir (LEVEMIR FLEXTOUCH) 100 UNIT/ML FlexPen    Sig: Inject 40-45 Units into the skin daily.    Dispense:  12 pen    Refill:  1    Requesting 1 year supply    Follow-up: No follow-ups on file.    Beatrice Lecher, MD

## 2019-12-07 NOTE — Assessment & Plan Note (Signed)
Last TSH was elevated with a normal free to T4.  We will repeat labs today if it persists then most consistent with subclinical hypothyroidism will need to be monitored periodically.

## 2019-12-07 NOTE — Assessment & Plan Note (Addendum)
I am concerned that he still having some significant dizziness that is inhibiting his ability to do things like play golf and at times even drive.  Even though he is not having pain, weakness, or cramping in his left arm.  No claudication symptoms.  Continue Plavix.  They did recommend aspirin though he is not currently taking that.

## 2019-12-09 ENCOUNTER — Telehealth (INDEPENDENT_AMBULATORY_CARE_PROVIDER_SITE_OTHER): Payer: Self-pay | Admitting: Vascular Surgery

## 2019-12-09 NOTE — Telephone Encounter (Signed)
Called to inform us he had a consult with his PCP after visit with GS 11-21-19 as a new patient. He is wanting to come back to be seen and continue his plan of care that they discussed. This note is for documentation only.

## 2019-12-11 ENCOUNTER — Encounter: Payer: Self-pay | Admitting: Emergency Medicine

## 2019-12-11 ENCOUNTER — Other Ambulatory Visit: Payer: Self-pay

## 2019-12-11 ENCOUNTER — Emergency Department
Admission: EM | Admit: 2019-12-11 | Discharge: 2019-12-11 | Disposition: A | Discharge status: Home / Self Care | Payer: Medicare Other | Attending: Emergency Medicine | Admitting: Emergency Medicine

## 2019-12-11 DIAGNOSIS — E1165 Type 2 diabetes mellitus with hyperglycemia: Secondary | ICD-10-CM | POA: Diagnosis not present

## 2019-12-11 DIAGNOSIS — Z5321 Procedure and treatment not carried out due to patient leaving prior to being seen by health care provider: Secondary | ICD-10-CM | POA: Diagnosis not present

## 2019-12-11 LAB — COMPREHENSIVE METABOLIC PANEL
ALT: 23 U/L (ref 0–44)
AST: 19 U/L (ref 15–41)
Albumin: 4.5 g/dL (ref 3.5–5.0)
Alkaline Phosphatase: 54 U/L (ref 38–126)
Anion gap: 10 (ref 5–15)
BUN: 17 mg/dL (ref 8–23)
CO2: 25 mmol/L (ref 22–32)
Calcium: 9.3 mg/dL (ref 8.9–10.3)
Chloride: 103 mmol/L (ref 98–111)
Creatinine, Ser: 1.44 mg/dL — ABNORMAL HIGH (ref 0.61–1.24)
GFR calc Af Amer: 55 mL/min — ABNORMAL LOW (ref >60)
GFR calc non Af Amer: 48 mL/min — ABNORMAL LOW (ref >60)
Glucose, Bld: 244 mg/dL — ABNORMAL HIGH (ref 70–99)
Potassium: 4.7 mmol/L (ref 3.5–5.1)
Sodium: 138 mmol/L (ref 135–145)
Total Bilirubin: 0.5 mg/dL (ref 0.3–1.2)
Total Protein: 7.6 g/dL (ref 6.5–8.1)

## 2019-12-11 LAB — CBC WITH DIFFERENTIAL/PLATELET
Abs Immature Granulocytes: 0.01 10*3/uL (ref 0.00–0.07)
Basophils Absolute: 0 10*3/uL (ref 0.0–0.1)
Basophils Relative: 1 %
Eosinophils Absolute: 0.2 10*3/uL (ref 0.0–0.5)
Eosinophils Relative: 3 %
HCT: 42.9 % (ref 39.0–52.0)
Hemoglobin: 14.6 g/dL (ref 13.0–17.0)
Immature Granulocytes: 0 %
Lymphocytes Relative: 35 %
Lymphs Abs: 2.1 10*3/uL (ref 0.7–4.0)
MCH: 33.2 pg (ref 26.0–34.0)
MCHC: 34 g/dL (ref 30.0–36.0)
MCV: 97.5 fL (ref 80.0–100.0)
Monocytes Absolute: 0.4 10*3/uL (ref 0.1–1.0)
Monocytes Relative: 7 %
Neutro Abs: 3.3 10*3/uL (ref 1.7–7.7)
Neutrophils Relative %: 54 %
Platelets: 164 10*3/uL (ref 150–400)
RBC: 4.4 MIL/uL (ref 4.22–5.81)
RDW: 11.9 % (ref 11.5–15.5)
WBC: 6.1 10*3/uL (ref 4.0–10.5)
nRBC: 0 % (ref 0.0–0.2)

## 2019-12-11 LAB — MAGNESIUM: Magnesium: 1.8 mg/dL (ref 1.7–2.4)

## 2019-12-11 LAB — GLUCOSE, CAPILLARY
Glucose-Capillary: 223 mg/dL — ABNORMAL HIGH (ref 70–99)
Glucose-Capillary: 167 mg/dL — ABNORMAL HIGH (ref 70–99)

## 2019-12-11 NOTE — ED Notes (Signed)
CBG recheck 167

## 2019-12-11 NOTE — ED Triage Notes (Signed)
Pt presents to ED via POV with c/o hyperglycemia. Pt states is a diabetic and has been for 15 yrs. Pt states this morning his glucometer is reading 314 and was referred to ED by [facility].   In triage pt noted to have intermittent bradycardia with HR ranging from 27-82 while.   Pt states after over 300 CBG reading pt went to [employer] to exercise. Pt states gave himself an extra 60 units, normally takes 40 units of levemir at 1800 everyday.

## 2019-12-13 ENCOUNTER — Telehealth: Payer: Self-pay | Admitting: Family Medicine

## 2019-12-13 NOTE — Telephone Encounter (Signed)
Internal note sent to Dr. Pryor Ochoa.

## 2019-12-16 ENCOUNTER — Other Ambulatory Visit: Payer: Self-pay | Admitting: Family Medicine

## 2019-12-20 ENCOUNTER — Telehealth: Payer: Self-pay

## 2019-12-20 NOTE — Telephone Encounter (Signed)
Okay for patient to increase lisinopril to 5 mg/day. Please asked patient to continue to monitor his blood pressure and report blood pressures greater than 182 systolic or 80 diastolic to the office. If his blood pressures begin to drop below 120/60 please ask him to let us know. If he begins to develop symptoms of dizziness, lightheadedness, or feeling like he is going to pass out please tell him to report the symptoms immediately.

## 2019-12-20 NOTE — Telephone Encounter (Signed)
Jon [name] has had an elevated blood pressure for a few days. He is a 40 minute patient. He is taking Lisinopril 2.5 mg. He wanted to know if he could increase the Lisinopril to 5 mg daily. Denies chest pain or shortness of breath. 160/85. Please advise.

## 2019-12-21 NOTE — Telephone Encounter (Signed)
Patient advised of recommendations.  

## 2019-12-21 NOTE — Telephone Encounter (Signed)
Left message for a return call

## 2019-12-22 DIAGNOSIS — R7989 Other specified abnormal findings of blood chemistry: Secondary | ICD-10-CM | POA: Diagnosis not present

## 2019-12-22 DIAGNOSIS — E039 Hypothyroidism, unspecified: Secondary | ICD-10-CM | POA: Diagnosis not present

## 2019-12-23 ENCOUNTER — Other Ambulatory Visit: Payer: Self-pay | Admitting: *Deleted

## 2019-12-23 DIAGNOSIS — R7989 Other specified abnormal findings of blood chemistry: Secondary | ICD-10-CM

## 2019-12-23 LAB — T4, FREE: Free T4: 1.1 ng/dL (ref 0.8–1.8)

## 2019-12-23 LAB — TSH+FREE T4: TSH W/REFLEX TO FT4: 8.18 mIU/L — ABNORMAL HIGH (ref 0.40–4.50)

## 2019-12-26 ENCOUNTER — Ambulatory Visit (INDEPENDENT_AMBULATORY_CARE_PROVIDER_SITE_OTHER): Payer: Medicare Other | Admitting: Vascular Surgery

## 2020-02-10 ENCOUNTER — Telehealth: Payer: Self-pay

## 2020-02-10 NOTE — Telephone Encounter (Signed)
Patient advised of recommendations.  

## 2020-02-10 NOTE — Telephone Encounter (Signed)
I think would be okay to increase lisinopril to 5 mg daily.  I think he has been taking 2.5.  Suspect his real blood pressure is somewhere in between the 126 in the 163 which would mean about in the 140s.  So I think it be reasonable to increase the lisinopril to 5 mg.

## 2020-02-10 NOTE — Telephone Encounter (Signed)
Donald called and reports elevated blood pressure. Denies chest pains or headaches. Declined a visit today with Emeterio Reeve.   Left arm 126/75 reports blockage and that is why it is always lower the the right.  Right arm 163/83

## 2020-02-12 ENCOUNTER — Other Ambulatory Visit: Payer: Self-pay | Admitting: Family Medicine

## 2020-02-13 ENCOUNTER — Ambulatory Visit: Payer: Medicare Other | Admitting: Family Medicine

## 2020-02-23 ENCOUNTER — Other Ambulatory Visit: Payer: Self-pay | Admitting: Family Medicine

## 2020-02-23 NOTE — Telephone Encounter (Signed)
PT wants to be seen for A1C check. Wanted something sooner then November.   Please advise.

## 2020-02-24 NOTE — Telephone Encounter (Signed)
It has been 90 days for insurance to cover the A1c which would put him right around October 28.  We could certainly put him right at the end of the month and that would be fine but just let him know that.  If he is having problems with his blood sugars, for example that are going too high or too low that I am more than happy to do a virtual visit if we need to troubleshoot that ahead of time and then still keep the appointment for the regular hemoglobin A1c check.

## 2020-02-28 NOTE — Telephone Encounter (Signed)
Called patient about appointment, he states he was not wanting to move appt up for sugars, but more about blood pressures.   States over the last 90 days, he has noticed increase in his blood pressure readings. Recently he has been getting readings of 150s-170s over 52s.   He was advised early October to increase Lisinopril to 5 mg "I think would be okay to increase lisinopril to 5 mg daily.  I think he has been taking 2.5.  Suspect his real blood pressure is somewhere in between the 126 in the 163 which would mean about in the 140s.  So I think it be reasonable to increase the lisinopril to 5 mg."   Verified with patient, he is taking three tablets daily and still having elevated readings.    Acute appointment needed to address?

## 2020-02-28 NOTE — Telephone Encounter (Signed)
Left pt msg to call back to discuss

## 2020-02-28 NOTE — Telephone Encounter (Signed)
It sounds like he is taking a total of 7.5 mg.  Lets go ahead and bump him to the 10 mg dose.  We can send it locally if he would like.  And then lets get him scheduled early next week on my schedule since I am not here half of Thursday and on Friday.  But we can certainly get him in early next week for blood pressure check.

## 2020-02-28 NOTE — Addendum Note (Signed)
Addended by: Beatrice Lecher D on: 02/28/2020 01:01 PM   Modules accepted: Orders

## 2020-02-29 MED ORDER — LISINOPRIL 10 MG PO TABS: 10.0000 mg | ORAL_TABLET | Freq: Every day | ORAL | 0 refills | Status: DC

## 2020-02-29 NOTE — Telephone Encounter (Signed)
Spoke with patient about recommendations.   He is starting the 10 mg dose and will also tract pressures at home. The only day he could make an appointment next week was Wednesday or Friday. Your Wednesday is completely booked- and I didn't schedule him Friday since he already has diabetic appointment with you Monday November 1st.   Is he OK to start new dose and recheck pressures in office with you at his appt on Monday November 1st?

## 2020-02-29 NOTE — Addendum Note (Signed)
Addended by: Towana Badger on: 02/29/2020 10:04 AM   Modules accepted: Orders

## 2020-03-01 NOTE — Telephone Encounter (Signed)
I can see him around 2:00 next Wednesday if he would like or he can come in early around 745.  Either would be fine with me.  Or if he feels comfortable just adjusting the medication and waiting until November 1 and that is perfectly fine.  Either is okay with me.

## 2020-03-01 NOTE — Telephone Encounter (Signed)
Scheduled Wednesday at 2:00

## 2020-03-07 ENCOUNTER — Other Ambulatory Visit: Payer: Self-pay

## 2020-03-07 ENCOUNTER — Other Ambulatory Visit: Payer: Self-pay | Admitting: Family Medicine

## 2020-03-07 ENCOUNTER — Ambulatory Visit (INDEPENDENT_AMBULATORY_CARE_PROVIDER_SITE_OTHER): Payer: Medicare Other | Admitting: Family Medicine

## 2020-03-07 VITALS — BP 151/59 | HR 42 | Ht 73.0 in | Wt 206.5 lb

## 2020-03-07 DIAGNOSIS — Z23 Encounter for immunization: Secondary | ICD-10-CM

## 2020-03-07 DIAGNOSIS — R42 Dizziness and giddiness: Secondary | ICD-10-CM | POA: Diagnosis not present

## 2020-03-07 DIAGNOSIS — I771 Stricture of artery: Secondary | ICD-10-CM | POA: Diagnosis not present

## 2020-03-07 DIAGNOSIS — R55 Syncope and collapse: Secondary | ICD-10-CM

## 2020-03-07 DIAGNOSIS — E118 Type 2 diabetes mellitus with unspecified complications: Secondary | ICD-10-CM

## 2020-03-07 DIAGNOSIS — I10 Essential (primary) hypertension: Secondary | ICD-10-CM

## 2020-03-07 MED ORDER — LEVEMIR FLEXTOUCH 100 UNIT/ML ~~LOC~~ SOPN: 50.0000 [IU] | PEN_INJECTOR | Freq: Every day | SUBCUTANEOUS | 3 refills | Status: DC

## 2020-03-07 MED ORDER — SYNJARDY 12.5-500 MG PO TABS: 1.0000 | ORAL_TABLET | Freq: Two times a day (BID) | ORAL | 1 refills | Status: DC

## 2020-03-07 NOTE — Progress Notes (Signed)
Established Patient Office Visit  Subjective:  Patient ID: Jon [name], male    DOB: [birth year]  Age: 74 y.o. MRN: [id number]  CC:  Chief Complaint  Patient presents with  . Hypertension    HPI Jon [name] presents for a couple of concerns today.  Follow-up dizziness-he did decide to stop his trazodone to see if that could be triggering some of his dizziness he says he is been off of it for about 2 months now and noticed that it seems to be much better.  Though it is also not as hot when [place] is out [place] and so wonders if that could even be contributing as well he wants to know how to tell if it is really the medication or not.  Instead of the trazodone for sleep he is actually been using Benadryl, 50 mg, and says that actually is been working well without any significant sedation or side effects.  Follow-up diabetes-he did stop his Metformin about 5 days ago and restarted his Synjardy.  At one point we had stopped the [place] because of low blood pressures and because we felt like it could be contributing to some of his dizziness as the medication can work somewhat as a diuretic.  Though his GI tract seems to tolerate the [place] much better.  He really does not want to go back on plain Metformin because of that.  He has noticed a pattern that when his blood pressure shoots up his glucose always seems to shoot up at the same time he wonders what could actually be causing that.  Blood pressures were well controlled in fact even low over most of this past year and now all of a sudden they are going up into the 150s 160s and he is even seen blood pressures in the 180s.  He is now taking a total of 10 mg of the lisinopril.  He had the 2.5's at home and was taking a total of 4.  Past Medical History:  Diagnosis Date  . BENIGN POSITIONAL VERTIGO 05/20/2010   Qualifier: Diagnosis of  By: Madilyn Fireman MD, Barnetta Chapel    . Diabetes mellitus   . Epididymitis   . Hyperlipemia    . Hypertension   . Stroke [facility]) 7/11    Past Surgical History:  Procedure Laterality Date  . PILONIDAL CYST EXCISION  1960, 61, 62, 63  . VASECTOMY      Family History  Problem Relation Age of Onset  . Diabetes Other        family hx of  . Colon cancer Neg Hx     Social History   Socioeconomic History  . Marital status: Divorced    Spouse name: Not on file  . Number of children: 2  . Years of education: Not on file  . Highest education level: Not on file  Occupational History  . Occupation: Retired  Tobacco Use  . Smoking status: Current Every Day Smoker    Packs/day: 2.00    Years: 58.00    Pack years: 116.00    Types: Cigarettes  . Smokeless tobacco: Never Used  . Tobacco comment: Counseling sheet given in exam room 07-2011   Substance and Sexual Activity  . Alcohol use: No  . Drug use: No  . Sexual activity: Not on file  Other Topics Concern  . Not on file  Social History Narrative   Daily caffeine    Social Determinants of Health   Financial Resource Strain:   .  Difficulty of Paying Living Expenses: Not on file  Food Insecurity:   . Worried About Charity fundraiser in the Last Year: Not on file  . Ran Out of Food in the Last Year: Not on file  Transportation Needs:   . Lack of Transportation (Medical): Not on file  . Lack of Transportation (Non-Medical): Not on file  Physical Activity:   . Days of Exercise per Week: Not on file  . Minutes of Exercise per Session: Not on file  Stress:   . Feeling of Stress : Not on file  Social Connections:   . Frequency of Communication with Friends and Family: Not on file  . Frequency of Social Gatherings with Friends and Family: Not on file  . Attends Religious Services: Not on file  . Active Member of Clubs or Organizations: Not on file  . Attends Archivist Meetings: Not on file  . Marital Status: Not on file  Intimate Partner Violence:   . Fear of Current or Ex-Partner: Not on file  .  Emotionally Abused: Not on file  . Physically Abused: Not on file  . Sexually Abused: Not on file    Outpatient Medications Prior to Visit  Medication Sig Dispense Refill  . Blood Glucose Monitoring Suppl (ACCU-CHEK AVIVA PLUS) w/Device KIT Check fasting blood sugar daily and before meals. DM Diagnosis 1 kit prn  . fish oil-omega-3 fatty acids 1000 MG capsule Take 4 g by mouth daily.    [name] glucose blood (ACCU-CHEK AVIVA PLUS) test strip USE ONE STRIP TO CHECK  BLOOD SUGAR TWICE DAILY AS  DIRECTED 200 strip 3  . Insulin Pen Needle (B-D ULTRAFINE III SHORT PEN) 31G X 8 MM MISC INJECT INTO SKIN DAILY. 100 each 3  . ketoconazole (NIZORAL) 2 % cream APPLY TO AFFECTED AREA(S)  TOPICALLY ONCE DAILY 45 g 3  . lisinopril (ZESTRIL) 10 MG tablet Take 1 tablet (10 mg total) by mouth daily. 30 tablet 0  . simvastatin (ZOCOR) 40 MG tablet Take 1 tablet (40 mg total) by mouth at bedtime. 90 tablet 3  . atorvastatin (LIPITOR) 40 MG tablet Take 1 tablet (40 mg total) by mouth daily. 90 tablet 3  . clopidogrel (PLAVIX) 75 MG tablet TAKE 1 TABLET BY MOUTH  DAILY 90 tablet 3  . insulin detemir (LEVEMIR FLEXTOUCH) 100 UNIT/ML FlexPen Inject 40-45 Units into the skin daily. 12 pen 1  . metFORMIN (GLUCOPHAGE) 1000 MG tablet Take 1 tablet (1,000 mg total) by mouth 2 (two) times daily with a meal. 180 tablet 1  . traZODone (DESYREL) 50 MG tablet TAKE 1/2 TO 1 TABLET BY  MOUTH AT BEDTIME AS NEEDED  FOR SLEEP 90 tablet 3   No facility-administered medications prior to visit.    Allergies  Allergen Reactions  . Gabapentin Other (See Comments)    Nightmares.   . Lisinopril Other (See Comments)    Hypotension  . Metformin And Related Diarrhea  . Trazodone And Nefazodone Other (See Comments)    dizziness    ROS Review of Systems    Objective:    Physical Exam Constitutional:      Appearance: He is well-developed.  HENT:     Head: Normocephalic and atraumatic.  Cardiovascular:     Rate and Rhythm:  Normal rate and regular rhythm.     Heart sounds: Normal heart sounds.  Pulmonary:     Effort: Pulmonary effort is normal.     Breath sounds: Normal breath sounds.  Skin:  General: Skin is warm and dry.  Neurological:     Mental Status: He is alert and oriented to person, place, and time.  Psychiatric:        Behavior: Behavior normal.     BP (!) 151/59   Pulse (!) 42   Ht $R'6\' 1"'gs$  (1.854 m)   Wt 206 lb 8 oz (93.7 kg)   SpO2 100%   BMI 27.24 kg/m  Wt Readings from Last 3 Encounters:  03/07/20 206 lb 8 oz (93.7 kg)  12/07/19 (!) 212 lb (96.2 kg)  11/21/19 212 lb (96.2 kg)     There are no preventive care reminders to display for this patient.  There are no preventive care reminders to display for this patient.  Lab Results  Component Value Date   TSH 7.07 (H) 09/07/2019   Lab Results  Component Value Date   WBC 6.1 12/11/2019   HGB 14.6 12/11/2019   HCT 42.9 12/11/2019   MCV 97.5 12/11/2019   PLT 164 12/11/2019   Lab Results  Component Value Date   NA 138 12/11/2019   K 4.7 12/11/2019   CO2 25 12/11/2019   GLUCOSE 244 (H) 12/11/2019   BUN 17 12/11/2019   CREATININE 1.44 (H) 12/11/2019   BILITOT 0.5 12/11/2019   ALKPHOS 54 12/11/2019   AST 19 12/11/2019   ALT 23 12/11/2019   PROT 7.6 12/11/2019   ALBUMIN 4.5 12/11/2019   CALCIUM 9.3 12/11/2019   ANIONGAP 10 12/11/2019   Lab Results  Component Value Date   CHOL 166 09/07/2019   Lab Results  Component Value Date   HDL 46 09/07/2019   Lab Results  Component Value Date   LDLCALC 93 09/07/2019   Lab Results  Component Value Date   TRIG 175 (H) 09/07/2019   Lab Results  Component Value Date   CHOLHDL 3.6 09/07/2019   Lab Results  Component Value Date   HGBA1C 7.6 (A) 12/07/2019      Assessment & Plan:   Problem List Items Addressed This Visit      Cardiovascular and Mediastinum   Subclavian arterial stenosis ([place])    In some ways it is difficult to assess and manage his blood  pressure so if we take the average of the right arm in the left arm that his blood pressures would be mostly in the 120s and 130s in which case I think is perfectly fine to continue with just the 10 mg of lisinopril.      Relevant Medications   AMBULATORY NON FORMULARY MEDICATION   Other Relevant Orders   TSH + free T4   COMPLETE METABOLIC PANEL WITH GFR   CBC with Differential/Platelet   Catecholamines, fractionated, Urine, 24 hour   Metanephrines, Urine, 24 hour   Catecholamines, Fractionated, Plasma   Cortisol-am, blood   Metanephrines, plasma   Postural dizziness with near syncope    He does feel better since stopping his trazodone as well as the fall weather hitting and being cooler on the golf course.      Relevant Medications   AMBULATORY NON FORMULARY MEDICATION   Other Relevant Orders   TSH + free T4   COMPLETE METABOLIC PANEL WITH GFR   CBC with Differential/Platelet   Catecholamines, fractionated, Urine, 24 hour   Metanephrines, Urine, 24 hour   Catecholamines, Fractionated, Plasma   Cortisol-am, blood   Metanephrines, plasma   Essential hypertension    Pressures have been more significantly elevated recently so we will do some additional  labs to rule out other potential causes consider renal artery stenosis as well as pheochromocytoma and abnormal cortisol levels.  He also notes that his blood pressure significantly higher when he first wakes up in the morning would also like to do an overnight pulse oximetry for further evaluation since he has been a longtime heavy smoker and does have emphysema.  No known or prior history of sleep apnea.      Relevant Medications   AMBULATORY NON FORMULARY MEDICATION   Other Relevant Orders   TSH + free T4   COMPLETE METABOLIC PANEL WITH GFR   CBC with Differential/Platelet   Catecholamines, fractionated, Urine, 24 hour   Metanephrines, Urine, 24 hour   Catecholamines, Fractionated, Plasma   Cortisol-am, blood   Metanephrines,  plasma     Endocrine   Controlled diabetes mellitus type 2 with complications (HCC)    Off of Metformin as it causes significant GI upset and diarrhea that is to the point of incontinence.  But he does do well on Synjardy so he is back on that for now.  Plan to follow-up in 3 to 4 months.      Relevant Medications   insulin detemir (LEVEMIR FLEXTOUCH) 100 UNIT/ML FlexPen   Empagliflozin-metFORMIN HCl (SYNJARDY) 12.5-500 MG TABS    Other Visit Diagnoses    Need for immunization against influenza    -  Primary   Relevant Orders   Flu Vaccine QUAD High Dose(Fluad) (Completed)      Meds ordered this encounter  Medications  . insulin detemir (LEVEMIR FLEXTOUCH) 100 UNIT/ML FlexPen    Sig: Inject 50 Units into the skin daily.    Dispense:  45 mL    Refill:  3  . Empagliflozin-metFORMIN HCl (SYNJARDY) 12.5-500 MG TABS    Sig: Take 1 tablet by mouth 2 (two) times daily.    Dispense:  180 tablet    Refill:  1  . AMBULATORY NON FORMULARY MEDICATION    Sig: Medication Name: overnight pulse ox. Dx emphysema, Hypertension    Dispense:  1 Units    Refill:  0    Follow-up: No follow-ups on file.    Beatrice Lecher, MD

## 2020-03-07 NOTE — Progress Notes (Signed)
Pt brought in his home BP cuff these were the readings from today:  R Arm: 133/87 p:87 L Arm:  125/83 p:75 His average= 129/82 p:81  Our readings were  R arm:  151/59 p:42 L arm:   100/59 p:44 Average= 125/59  He has been taking Lisinopril 10 mg x 1 week. Prior to this he was on 2.5 mg.   His home BS today was 163 7 day=   218 14 day= 195 30 day= 168 90 day= 158  POC glucose=166 A1c= 7.2%  He stopped taking metformin 1000 BID five (5) days ago. He stated it caused him to have extreme diarrhea. He stated that he had some Synjardy left over and restarted this 5 days ago. He also has increased the Levemir to 50 U since his BS have been running high   He also stopped taking Trazadone due to it causing him to be dizzy. He started taking two (2) of the Benadryl 25 mg and stated that this helps him to sleep.   He asked for refills on the Synjardy and Levemir. Both sent to pt's mail order pharmacy.

## 2020-03-08 ENCOUNTER — Encounter: Payer: Self-pay | Admitting: Family Medicine

## 2020-03-08 DIAGNOSIS — R42 Dizziness and giddiness: Secondary | ICD-10-CM | POA: Insufficient documentation

## 2020-03-08 DIAGNOSIS — R55 Syncope and collapse: Secondary | ICD-10-CM | POA: Insufficient documentation

## 2020-03-08 MED ORDER — AMBULATORY NON FORMULARY MEDICATION: 0 refills | Status: DC

## 2020-03-08 NOTE — Assessment & Plan Note (Signed)
Pressures have been more significantly elevated recently so we will do some additional labs to rule out other potential causes consider renal artery stenosis as well as pheochromocytoma and abnormal cortisol levels.  He also notes that his blood pressure significantly higher when he first wakes up in the morning would also like to do an overnight pulse oximetry for further evaluation since he has been a longtime heavy smoker and does have emphysema.  No known or prior history of sleep apnea.

## 2020-03-08 NOTE — Assessment & Plan Note (Signed)
In some ways it is difficult to assess and manage his blood pressure so if we take the average of the right arm in the left arm that his blood pressures would be mostly in the 120s and 130s in which case I think is perfectly fine to continue with just the 10 mg of lisinopril.

## 2020-03-08 NOTE — Assessment & Plan Note (Signed)
He does feel better since stopping his trazodone as well as the fall weather hitting and being cooler on the golf course.

## 2020-03-08 NOTE — Assessment & Plan Note (Signed)
Off of Metformin as it causes significant GI upset and diarrhea that is to the point of incontinence.  But he does do well on Synjardy so he is back on that for now.  Plan to follow-up in 3 to 4 months.

## 2020-03-12 ENCOUNTER — Ambulatory Visit: Payer: Medicare Other | Admitting: Family Medicine

## 2020-04-11 ENCOUNTER — Other Ambulatory Visit: Payer: Self-pay | Admitting: Family Medicine

## 2020-04-23 ENCOUNTER — Other Ambulatory Visit: Payer: Self-pay

## 2020-04-23 MED ORDER — LISINOPRIL 10 MG PO TABS: 10.0000 mg | ORAL_TABLET | Freq: Every day | ORAL | 0 refills | Status: DC

## 2020-04-24 ENCOUNTER — Other Ambulatory Visit: Payer: Self-pay | Admitting: *Deleted

## 2020-04-27 ENCOUNTER — Telehealth: Payer: Self-pay | Admitting: *Deleted

## 2020-04-27 NOTE — Telephone Encounter (Signed)
Spoke w/Jon [name] with [facility] about the change in the Lisinopril. Advised that pt is now taking the 10 mg and to d/c the 2.5 mg.   Called pt to inform him that this has been taken care of.

## 2020-06-04 ENCOUNTER — Other Ambulatory Visit: Payer: Self-pay

## 2020-06-04 ENCOUNTER — Encounter: Payer: Self-pay | Admitting: Family Medicine

## 2020-06-04 ENCOUNTER — Ambulatory Visit (INDEPENDENT_AMBULATORY_CARE_PROVIDER_SITE_OTHER): Payer: Medicare Other | Admitting: Family Medicine

## 2020-06-04 VITALS — BP 115/63 | HR 72 | Ht 73.0 in | Wt 217.0 lb

## 2020-06-04 DIAGNOSIS — R55 Syncope and collapse: Secondary | ICD-10-CM | POA: Diagnosis not present

## 2020-06-04 DIAGNOSIS — K118 Other diseases of salivary glands: Secondary | ICD-10-CM

## 2020-06-04 DIAGNOSIS — F17210 Nicotine dependence, cigarettes, uncomplicated: Secondary | ICD-10-CM | POA: Diagnosis not present

## 2020-06-04 DIAGNOSIS — I10 Essential (primary) hypertension: Secondary | ICD-10-CM | POA: Diagnosis not present

## 2020-06-04 DIAGNOSIS — E118 Type 2 diabetes mellitus with unspecified complications: Secondary | ICD-10-CM | POA: Diagnosis not present

## 2020-06-04 DIAGNOSIS — R42 Dizziness and giddiness: Secondary | ICD-10-CM | POA: Diagnosis not present

## 2020-06-04 DIAGNOSIS — R350 Frequency of micturition: Secondary | ICD-10-CM | POA: Diagnosis not present

## 2020-06-04 DIAGNOSIS — I771 Stricture of artery: Secondary | ICD-10-CM | POA: Diagnosis not present

## 2020-06-04 LAB — POCT GLYCOSYLATED HEMOGLOBIN (HGB A1C): Hemoglobin A1C: 7.9 % — AB (ref 4.0–5.6)

## 2020-06-04 NOTE — Assessment & Plan Note (Signed)
Really has had resolution of the symptoms since getting good control of his blood pressure not having hypotensive events and getting out of the summertime.  So as we move back into the spring and summer we will have to see if any of his symptoms recur.

## 2020-06-04 NOTE — Assessment & Plan Note (Signed)
Continue to monitor blood pressure carefully.

## 2020-06-04 NOTE — Assessment & Plan Note (Signed)
Not interested in quitting at this point.

## 2020-06-04 NOTE — Progress Notes (Signed)
Established Patient Office Visit  Subjective:  Patient ID: Jon [name], male    DOB: 07/05/1945  Age: 75 y.o. MRN: [id number]  CC:  Chief Complaint  Patient presents with  . Diabetes    HPI Jon [name] presents for 3 month f/u.    Hypertension- Pt denies chest pain, SOB, dizziness, or heart palpitations.  Taking meds as directed w/o problems.  Denies medication side effects.    Diabetes - no hypoglycemic events. No wounds or sores that are not healing well. No increased thirst or urination. Checking glucose at home. Taking medications as prescribed without any side effects. He is currently on Synjardy as well as Levemir 40 units daily.  He is also on an ACE inhibitor and a statin.  Does have a couple of concerns and that he is urinating frequently throughout the day.  Is also been getting some hard lump tissue in his abdomen where he has been giving insulin for quite some time.  He says sometimes the needle actually bend or he cannot seem to inject he will hit the button and it seems like the pressure is too hard and it will not actually inject that he recently switched to the upper outer arm area and says that his blood sugars have actually been much better in fact they are running about 20+ points better than when he was giving it in the abdomen.  Past Medical History:  Diagnosis Date  . BENIGN POSITIONAL VERTIGO 05/20/2010   Qualifier: Diagnosis of  By: Madilyn Fireman MD, Barnetta Chapel    . Diabetes mellitus   . Epididymitis   . Hyperlipemia   . Hypertension   . Stroke [facility]) 7/11    Past Surgical History:  Procedure Laterality Date  . PILONIDAL CYST EXCISION  1960, 61, 62, 63  . VASECTOMY      Family History  Problem Relation Age of Onset  . Diabetes Other        family hx of  . Colon cancer Neg Hx     Social History   Socioeconomic History  . Marital status: Divorced    Spouse name: Not on file  . Number of children: 2  . Years of education: Not on file  .  Highest education level: Not on file  Occupational History  . Occupation: Retired  Tobacco Use  . Smoking status: Current Every Day Smoker    Packs/day: 2.00    Years: 58.00    Pack years: 116.00    Types: Cigarettes  . Smokeless tobacco: Never Used  . Tobacco comment: Counseling sheet given in exam room 07-2011   Substance and Sexual Activity  . Alcohol use: No  . Drug use: No  . Sexual activity: Not on file  Other Topics Concern  . Not on file  Social History Narrative   Daily caffeine    Social Determinants of Health   Financial Resource Strain: Not on file  Food Insecurity: Not on file  Transportation Needs: Not on file  Physical Activity: Not on file  Stress: Not on file  Social Connections: Not on file  Intimate Partner Violence: Not on file    Outpatient Medications Prior to Visit  Medication Sig Dispense Refill  . AMBULATORY NON FORMULARY MEDICATION Medication Name: overnight pulse ox. Dx emphysema, Hypertension 1 Units 0  . atorvastatin (LIPITOR) 40 MG tablet Take 40 mg by mouth daily.    . Blood Glucose Monitoring Suppl (ACCU-CHEK AVIVA PLUS) w/Device KIT Check fasting blood sugar daily and  before meals. DM Diagnosis 1 kit prn  . clopidogrel (PLAVIX) 75 MG tablet TAKE 1 TABLET BY MOUTH  DAILY 90 tablet 3  . Empagliflozin-metFORMIN HCl (SYNJARDY) 12.5-500 MG TABS Take 1 tablet by mouth 2 (two) times daily. 180 tablet 1  . fish oil-omega-3 fatty acids 1000 MG capsule Take 4 g by mouth daily.    [name] glucose blood (ACCU-CHEK AVIVA PLUS) test strip USE ONE STRIP TO CHECK  BLOOD SUGAR TWICE DAILY AS  DIRECTED 200 strip 3  . insulin detemir (LEVEMIR FLEXTOUCH) 100 UNIT/ML FlexPen Inject 50 Units into the skin daily. 45 mL 3  . Insulin Pen Needle (B-D ULTRAFINE III SHORT PEN) 31G X 8 MM MISC INJECT INTO SKIN DAILY. 100 each 3  . ketoconazole (NIZORAL) 2 % cream APPLY TO AFFECTED AREA(S)  TOPICALLY ONCE DAILY 45 g 3  . lisinopril (ZESTRIL) 10 MG tablet Take 1 tablet (10 mg  total) by mouth daily. 90 tablet 0  . simvastatin (ZOCOR) 40 MG tablet Take 1 tablet (40 mg total) by mouth at bedtime. 90 tablet 3  . traZODone (DESYREL) 50 MG tablet      No facility-administered medications prior to visit.    Allergies  Allergen Reactions  . Gabapentin Other (See Comments)    Nightmares.   . Lisinopril Other (See Comments)    Hypotension  . Metformin And Related Diarrhea  . Trazodone And Nefazodone Other (See Comments)    dizziness    ROS Review of Systems    Objective:    Physical Exam  BP 115/63   Pulse 72   Ht 6\' 1"  (1.854 m)   Wt 217 lb (98.4 kg)   SpO2 100%   BMI 28.63 kg/m  Wt Readings from Last 3 Encounters:  06/04/20 217 lb (98.4 kg)  03/07/20 206 lb 8 oz (93.7 kg)  12/07/19 (!) 212 lb (96.2 kg)     Health Maintenance Due  Topic Date Due  . COVID-19 Vaccine (3 - Pfizer risk 4-dose series) 10/25/2019    There are no preventive care reminders to display for this patient.  Lab Results  Component Value Date   TSH 7.07 (H) 09/07/2019   Lab Results  Component Value Date   WBC 6.1 12/11/2019   HGB 14.6 12/11/2019   HCT 42.9 12/11/2019   MCV 97.5 12/11/2019   PLT 164 12/11/2019   Lab Results  Component Value Date   NA 138 12/11/2019   K 4.7 12/11/2019   CO2 25 12/11/2019   GLUCOSE 244 (H) 12/11/2019   BUN 17 12/11/2019   CREATININE 1.44 (H) 12/11/2019   BILITOT 0.5 12/11/2019   ALKPHOS 54 12/11/2019   AST 19 12/11/2019   ALT 23 12/11/2019   PROT 7.6 12/11/2019   ALBUMIN 4.5 12/11/2019   CALCIUM 9.3 12/11/2019   ANIONGAP 10 12/11/2019   Lab Results  Component Value Date   CHOL 166 09/07/2019   Lab Results  Component Value Date   HDL 46 09/07/2019   Lab Results  Component Value Date   LDLCALC 93 09/07/2019   Lab Results  Component Value Date   TRIG 175 (H) 09/07/2019   Lab Results  Component Value Date   CHOLHDL 3.6 09/07/2019   Lab Results  Component Value Date   HGBA1C 7.9 (A) 06/04/2020       Assessment & Plan:   Problem List Items Addressed This Visit      Cardiovascular and Mediastinum   Subclavian arterial stenosis (HCC)    Continue  to monitor blood pressure carefully.      Postural dizziness with near syncope    Really has had resolution of the symptoms since getting good control of his blood pressure not having hypotensive events and getting out of the summertime.  So as we move back into the spring and summer we will have to see if any of his symptoms recur.      Essential hypertension    Pressure looks perfect today.  Continue current regimen.  He has subclavian stenosis so we usually check blood pressures on both sides and take the mean which would look fantastic today.        Endocrine   Controlled diabetes mellitus type 2 with complications ([place]) - Primary    Doing okay overall.  A1c went up slightly to 7.9.  But he is already made some changes he has been giving him insulin in the upper arm area and his blood sugars have actually looked better by almost about 20 points on average.  He also in the last week or 2 was decided to really cut back on some of his sugar intake he still buys cakes and cookies.  But is planning on really cutting back on some of that to see if that makes a difference.      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  He has a small lump on the left side but he does not want to do anything about it at this time.      Continuous dependence on cigarette smoking    Not interested in quitting at this point.       Other Visit Diagnoses    Frequent urination          Frequent urination-I think this is probably secondary to the Synjardy discussed how this medication works and that if he eats more sugary high carb foods and he will urinate more if he eats less of than the urination actually should improve.  He is not having any  dysuria or change in color or odor to the urine so he really does not think it is a UTI.  No orders of the defined types were placed in this encounter.   Follow-up: Return in about 3 months (around 09/02/2020) for Diabetes follow-up.    Beatrice Lecher, MD

## 2020-06-04 NOTE — Progress Notes (Signed)
bp R arm: 135/68 P: 71  He is taking  40 U of Lev[place]

## 2020-06-04 NOTE — Assessment & Plan Note (Signed)
Pressure looks perfect today.  Continue current regimen.  He has subclavian stenosis so we usually check blood pressures on both sides and take the mean which would look fantastic today.

## 2020-06-04 NOTE — Assessment & Plan Note (Signed)
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  He has a small lump on the left side but he does not want to do anything about it at this time.

## 2020-06-04 NOTE — Assessment & Plan Note (Signed)
Doing okay overall.  A1c went up slightly to 7.9.  But he is already made some changes he has been giving him insulin in the upper arm area and his blood sugars have actually looked better by almost about 20 points on average.  He also in the last week or 2 was decided to really cut back on some of his sugar intake he still buys cakes and cookies.  But is planning on really cutting back on some of that to see if that makes a difference.

## 2020-06-17 ENCOUNTER — Other Ambulatory Visit: Payer: Self-pay | Admitting: Family Medicine

## 2020-07-01 ENCOUNTER — Other Ambulatory Visit: Payer: Self-pay | Admitting: Family Medicine

## 2020-07-24 ENCOUNTER — Other Ambulatory Visit: Payer: Self-pay | Admitting: Family Medicine

## 2020-07-25 ENCOUNTER — Ambulatory Visit (INDEPENDENT_AMBULATORY_CARE_PROVIDER_SITE_OTHER): Payer: Medicare Other | Admitting: Family Medicine

## 2020-07-25 DIAGNOSIS — Z0000 Encounter for general adult medical examination without abnormal findings: Secondary | ICD-10-CM | POA: Diagnosis not present

## 2020-07-25 NOTE — Patient Instructions (Addendum)
Colonoscopy, Adult A colonoscopy is a procedure to look at the entire large intestine. This procedure is done using a long, thin, flexible tube that has a camera on the end. You may have a colonoscopy:  As a part of normal colorectal screening.  If you have certain symptoms, such as: ? A low number of red blood cells in your blood (anemia). ? Diarrhea that does not go away. ? Pain in your abdomen. ? Blood in your stool. A colonoscopy can help screen for and diagnose medical problems, including:  Tumors.  Extra tissue that grows where mucus forms (polyps).  Inflammation.  Areas of bleeding. Tell your health care provider about:  Any allergies you have.  All medicines you are taking, including vitamins, herbs, eye drops, creams, and over-the-counter medicines.  Any problems you or family members have had with anesthetic medicines.  Any blood disorders you have.  Any surgeries you have had.  Any medical conditions you have.  Any problems you have had with having bowel movements.  Whether you are pregnant or may be pregnant. What are the risks? Generally, this is a safe procedure. However, problems may occur, including:  Bleeding.  Damage to your intestine.  Allergic reactions to medicines given during the procedure.  Infection. This is rare. What happens before the procedure? Eating and drinking restrictions Follow instructions from your health care provider about eating or drinking restrictions, which may include:  A few days before the procedure: ? Follow a low-fiber diet. ? Avoid nuts, seeds, dried fruit, raw fruits, and vegetables.  1-3 days before the procedure: ? Eat only gelatin dessert or ice pops. ? Drink only clear liquids, such as water, clear juice, clear broth or bouillon, black coffee or tea, or clear soft drinks or sports drinks. ? Avoid liquids that contain red or purple dye.  The day of the procedure: ? Do not eat solid foods. You may  continue to drink clear liquids until up to 2 hours before the procedure. ? Do not eat or drink anything starting 2 hours before the procedure, or within the time period that your health care provider recommends. Bowel prep If you were prescribed a bowel prep to take by mouth (orally) to clean out your colon:  Take it as told by your health care provider. Starting the day before your procedure, you will need to drink a large amount of liquid medicine. The liquid will cause you to have many bowel movements of loose stool until your stool becomes almost clear or light green.  If your skin or the opening between the buttocks (anus) gets irritated from diarrhea, you may relieve the irritation using: ? Wipes with medicine in them, such as adult wet wipes with aloe and vitamin E. ? A product to soothe skin, such as petroleum jelly.  If you vomit while drinking the bowel prep: ? Take a break for up to 60 minutes. ? Begin the bowel prep again. ? Call your health care provider if you keep vomiting or you cannot take the bowel prep without vomiting.  To clean out your colon, you may also be given: ? Laxative medicines. These help you have a bowel movement. ? Instructions for enema use. An enema is liquid medicine injected into your rectum. Medicines Ask your health care provider about:  Changing or stopping your regular medicines or supplements. This is especially important if you are taking iron supplements, diabetes medicines, or blood thinners.  Taking medicines such as aspirin and ibuprofen. These  medicines can thin your blood. Do not take these medicines unless your health care provider tells you to take them.  Taking over-the-counter medicines, vitamins, herbs, and supplements. General instructions  Ask your health care provider what steps will be taken to help prevent infection. These may include washing skin with a germ-killing soap.  Plan to have someone take you home from the hospital  or clinic. What happens during the procedure?  An IV will be inserted into one of your veins.  You may be given one or more of the following: ? A medicine to help you relax (sedative). ? A medicine to numb the area (local anesthetic). ? A medicine to make you fall asleep (general anesthetic). This is rarely needed.  You will lie on your side with your knees bent.  The tube will: ? Have oil or gel put on it (be lubricated). ? Be inserted into your anus. ? Be gently eased through all parts of your large intestine.  Air will be sent into your colon to keep it open. This may cause some pressure or cramping.  Images will be taken with the camera and will appear on a screen.  A small tissue sample may be removed to be looked at under a microscope (biopsy). The tissue may be sent to a lab for testing if any signs of problems are found.  If small polyps are found, they may be removed and checked for cancer cells.  When the procedure is finished, the tube will be removed. The procedure may vary among health care providers and hospitals.   What happens after the procedure?  Your blood pressure, heart rate, breathing rate, and blood oxygen level will be monitored until you leave the hospital or clinic.  You may have a small amount of blood in your stool.  You may pass gas and have mild cramping or bloating in your abdomen. This is caused by the air that was used to open your colon during the exam.  Do not drive for 24 hours after the procedure.  It is up to you to get the results of your procedure. Ask your health care provider, or the department that is doing the procedure, when your results will be ready. Summary  A colonoscopy is a procedure to look at the entire large intestine.  Follow instructions from your health care provider about eating and drinking before the procedure.  If you were prescribed an oral bowel prep to clean out your colon, take it as told by your health care  provider.  During the colonoscopy, a flexible tube with a camera on its end is inserted into the anus and then passed into the other parts of the large intestine. This information is not intended to replace advice given to you by your health care provider. Make sure you discuss any questions you have with your health care provider. Document Revised: 11/19/2018 Document Reviewed: 11/19/2018 Elsevier Patient Education  [place].   Diabetes Mellitus and [place] care is an important part of your health, especially when you have diabetes. Diabetes may cause you to have problems because of poor blood flow (circulation) to your feet and legs, which can cause your skin to:  Become thinner and drier.  Break more easily.  Heal more slowly.  Peel and crack. You may also have nerve damage (neuropathy) in your legs and feet, causing decreased feeling in them. This means that you may not notice minor injuries to your feet that could  lead to more serious problems. Noticing and addressing any potential problems early is the best way to prevent future foot problems. How to care for your feet Foot hygiene  Wash your feet daily with warm water and mild soap. Do not use hot water. Then, pat your feet and the areas between your toes until they are completely dry. Do not soak your feet as this can dry your skin.  Trim your toenails straight across. Do not dig under them or around the cuticle. File the edges of your nails with an emery board or nail file.  Apply a moisturizing lotion or petroleum jelly to the skin on your feet and to dry, brittle toenails. Use lotion that does not contain alcohol and is unscented. Do not apply lotion between your toes.   Shoes and socks  Wear clean socks or stockings every day. Make sure they are not too tight. Do not wear knee-high stockings since they may decrease blood flow to your legs.  Wear shoes that fit properly and have enough cushioning. Always  look in your shoes before you put them on to be sure there are no objects inside.  To break in new shoes, wear them for just a few hours a day. This prevents injuries on your feet. Wounds, scrapes, corns, and calluses  Check your feet daily for blisters, cuts, bruises, sores, and redness. If you cannot see the bottom of your feet, use a mirror or ask someone for help.  Do not cut corns or calluses or try to remove them with medicine.  If you find a minor scrape, cut, or break in the skin on your feet, keep it and the skin around it clean and dry. You may clean these areas with mild soap and water. Do not clean the area with peroxide, alcohol, or iodine.  If you have a wound, scrape, corn, or callus on your foot, look at it several times a day to make sure it is healing and not infected. Check for: ? Redness, swelling, or pain. ? Fluid or blood. ? Warmth. ? Pus or a bad smell.   General tips  Do not cross your legs. This may decrease blood flow to your feet.  Do not use heating pads or hot water bottles on your feet. They may burn your skin. If you have lost feeling in your feet or legs, you may not know this is happening until it is too late.  Protect your feet from hot and cold by wearing shoes, such as at the beach or on hot pavement.  Schedule a complete foot exam at least once a year (annually) or more often if you have foot problems. Report any cuts, sores, or bruises to your health care provider immediately. Where to find more information  American Diabetes Association: www.diabetes.org  Association of Diabetes Care & Education Specialists: www.diabeteseducator.org Contact a health care provider if:  You have a medical condition that increases your risk of infection and you have any cuts, sores, or bruises on your feet.  You have an injury that is not healing.  You have redness on your legs or feet.  You feel burning or tingling in your legs or feet.  You have pain or  cramps in your legs and feet.  Your legs or feet are numb.  Your feet always feel cold.  You have pain around any toenails. Get help right away if:  You have a wound, scrape, corn, or callus on your foot and: ? You  have pain, swelling, or redness that gets worse. ? You have fluid or blood coming from the wound, scrape, corn, or callus. ? Your wound, scrape, corn, or callus feels warm to the touch. ? You have pus or a bad smell coming from the wound, scrape, corn, or callus. ? You have a fever. ? You have a red line going up your leg. Summary  Check your feet every day for blisters, cuts, bruises, sores, and redness.  Apply a moisturizing lotion or petroleum jelly to the skin on your feet and to dry, brittle toenails.  Wear shoes that fit properly and have enough cushioning.  If you have foot problems, report any cuts, sores, or bruises to your health care provider immediately.  Schedule a complete foot exam at least once a year (annually) or more often if you have foot problems. This information is not intended to replace advice given to you by your health care provider. Make sure you discuss any questions you have with your health care provider. Document Revised: 11/17/2019 Document Reviewed: 11/17/2019 Elsevier Patient Education  [place] Maintenance, Male Adopting a healthy lifestyle and getting preventive care are important in promoting health and wellness. Ask your health care provider about:  The right schedule for you to have regular tests and exams.  Things you can do on your own to prevent diseases and keep yourself healthy. What should I know about diet, weight, and exercise? Eat a healthy diet  Eat a diet that includes plenty of vegetables, fruits, low-fat dairy products, and lean protein.  Do not eat a lot of foods that are high in solid fats, added sugars, or sodium.   Maintain a healthy weight Body mass index (BMI) is a measurement that  can be used to identify possible weight problems. It estimates body fat based on height and weight. Your health care provider can help determine your BMI and help you achieve or maintain a healthy weight. Get regular exercise Get regular exercise. This is one of the most important things you can do for your health. Most adults should:  Exercise for at least 150 minutes each week. The exercise should increase your heart rate and make you sweat (moderate-intensity exercise).  Do strengthening exercises at least twice a week. This is in addition to the moderate-intensity exercise.  Spend less time sitting. Even light physical activity can be beneficial. Watch cholesterol and blood lipids Have your blood tested for lipids and cholesterol at 74 years of age, then have this test every 5 years. You may need to have your cholesterol levels checked more often if:  Your lipid or cholesterol levels are high.  You are older than 74 years of age.  You are at high risk for heart disease. What should I know about cancer screening? Many types of cancers can be detected early and may often be prevented. Depending on your health history and family history, you may need to have cancer screening at various ages. This may include screening for:  Colorectal cancer.  Prostate cancer.  Skin cancer.  Lung cancer. What should I know about heart disease, diabetes, and high blood pressure? Blood pressure and heart disease  High blood pressure causes heart disease and increases the risk of stroke. This is more likely to develop in people who have high blood pressure readings, are of African descent, or are overweight.  Talk with your health care provider about your target blood pressure readings.  Have your blood pressure checked: ?  Every 3-5 years if you are 16-51 years of age. ? Every year if you are 83 years old or older.  If you are between the ages of 65 and 46 and are a current or former smoker, ask  your health care provider if you should have a one-time screening for abdominal aortic aneurysm (AAA). Diabetes Have regular diabetes screenings. This checks your fasting blood sugar level. Have the screening done:  Once every three years after age 39 if you are at a normal weight and have a low risk for diabetes.  More often and at a younger age if you are overweight or have a high risk for diabetes. What should I know about preventing infection? Hepatitis B If you have a higher risk for hepatitis B, you should be screened for this virus. Talk with your health care provider to find out if you are at risk for hepatitis B infection. Hepatitis C Blood testing is recommended for:  Everyone born from [birth year] through 1964.  Anyone with known risk factors for hepatitis C. Sexually transmitted infections (STIs)  You should be screened each year for STIs, including gonorrhea and chlamydia, if: ? You are sexually active and are younger than 74 years of age. ? You are older than 74 years of age and your health care provider tells you that you are at risk for this type of infection. ? Your sexual activity has changed since you were last screened, and you are at increased risk for chlamydia or gonorrhea. Ask your health care provider if you are at risk.  Ask your health care provider about whether you are at high risk for HIV. Your health care provider may recommend a prescription medicine to help prevent HIV infection. If you choose to take medicine to prevent HIV, you should first get tested for HIV. You should then be tested every 3 months for as long as you are taking the medicine. Follow these instructions at home: Lifestyle  Do not use any products that contain nicotine or tobacco, such as cigarettes, e-cigarettes, and chewing tobacco. If you need help quitting, ask your health care provider.  Do not use street drugs.  Do not share needles.  Ask your health care provider for help if you need  support or information about quitting drugs. Alcohol use  Do not drink alcohol if your health care provider tells you not to drink.  If you drink alcohol: ? Limit how much you have to 0-2 drinks a day. ? Be aware of how much alcohol is in your drink. In the [place] one drink equals one 12 oz bottle of beer (355 mL), one 5 oz glass of wine (148 mL), or one 1 oz glass of hard liquor (44 mL). General instructions  Schedule regular health, dental, and eye exams.  Stay current with your vaccines.  Tell your health care provider if: ? You often feel depressed. ? You have ever been abused or do not feel safe at home. Summary  Adopting a healthy lifestyle and getting preventive care are important in promoting health and wellness.  Follow your health care provider's instructions about healthy diet, exercising, and getting tested or screened for diseases.  Follow your health care provider's instructions on monitoring your cholesterol and blood pressure. This information is not intended to replace advice given to you by your health care provider. Make sure you discuss any questions you have with your health care provider. Document Revised: 04/21/2018 Document Reviewed: 04/21/2018 Elsevier Patient Education  2021 Elsevier  Inc.   Steps to Quit Smoking Smoking tobacco is the leading cause of preventable death. It can affect almost every organ in the body. Smoking puts you and people around you at risk for many serious, long-lasting (chronic) diseases. Quitting smoking can be hard, but it is one of the best things that you can do for your health. It is never too late to quit. How do I get ready to quit? When you decide to quit smoking, make a plan to help you succeed. Before you quit:  Pick a date to quit. Set a date within the next 2 weeks to give you time to prepare.  Write down the reasons why you are quitting. Keep this list in places where you will see it often.  Tell your family,  friends, and co-workers that you are quitting. Their support is important.  Talk with your doctor about the choices that may help you quit.  Find out if your health insurance will pay for these treatments.  Know the people, places, things, and activities that make you want to smoke (triggers). Avoid them. What first steps can I take to quit smoking?  Throw away all cigarettes at home, at work, and in your car.  Throw away the things that you use when you smoke, such as ashtrays and lighters.  Clean your car. Make sure to empty the ashtray.  Clean your home, including curtains and carpets. What can I do to help me quit smoking? Talk with your doctor about taking medicines and seeing a counselor at the same time. You are more likely to succeed when you do both.  If you are pregnant or breastfeeding, talk with your doctor about counseling or other ways to quit smoking. Do not take medicine to help you quit smoking unless your doctor tells you to do so. To quit smoking: Quit right away  Quit smoking totally, instead of slowly cutting back on how much you smoke over a period of time.  Go to counseling. You are more likely to quit if you go to counseling sessions regularly. Take medicine You may take medicines to help you quit. Some medicines need a prescription, and some you can buy over-the-counter. Some medicines may contain a drug called nicotine to replace the nicotine in cigarettes. Medicines may:  Help you to stop having the desire to smoke (cravings).  Help to stop the problems that come when you stop smoking (withdrawal symptoms). Your doctor may ask you to use:  Nicotine patches, gum, or lozenges.  Nicotine inhalers or sprays.  Non-nicotine medicine that is taken by mouth. Find resources Find resources and other ways to help you quit smoking and remain smoke-free after you quit. These resources are most helpful when you use them often. They include:  Online chats with a  Social worker.  Phone quitlines.  Printed Furniture conservator/restorer.  Support groups or group counseling.  Text messaging programs.  Mobile phone apps. Use apps on your mobile phone or tablet that can help you stick to your quit plan. There are many free apps for mobile phones and tablets as well as websites. Examples include Quit Guide from the [employer] and smokefree.gov   What things can I do to make it easier to quit?  Talk to your family and friends. Ask them to support and encourage you.  Call a phone quitline (1-800-QUIT-NOW), reach out to support groups, or work with a Social worker.  Ask people who smoke to not smoke around you.  Avoid places that make  you want to smoke, such as: ? Bars. ? Parties. ? Smoke-break areas at work.  Spend time with people who do not smoke.  Lower the stress in your life. Stress can make you want to smoke. Try these things to help your stress: ? Getting regular exercise. ? Doing deep-breathing exercises. ? Doing yoga. ? Meditating. ? Doing a body scan. To do this, close your eyes, focus on one area of your body at a time from head to toe. Notice which parts of your body are tense. Try to relax the muscles in those areas.   How will I feel when I quit smoking? Day 1 to 3 weeks Within the first 24 hours, you may start to have some problems that come from quitting tobacco. These problems are very bad 2-3 days after you quit, but they do not often last for more than 2-3 weeks. You may get these symptoms:  Mood swings.  Feeling restless, nervous, angry, or annoyed.  Trouble concentrating.  Dizziness.  Strong desire for high-sugar foods and nicotine.  Weight gain.  Trouble pooping (constipation).  Feeling like you may vomit (nausea).  Coughing or a sore throat.  Changes in how the medicines that you take for other issues work in your body.  Depression.  Trouble sleeping (insomnia). Week 3 and afterward After the first 2-3 weeks of quitting, you  may start to notice more positive results, such as:  Better sense of smell and taste.  Less coughing and sore throat.  Slower heart rate.  Lower blood pressure.  Clearer skin.  Better breathing.  Fewer sick days. Quitting smoking can be hard. Do not give up if you fail the first time. Some people need to try a few times before they succeed. Do your best to stick to your quit plan, and talk with your doctor if you have any questions or concerns. Summary  Smoking tobacco is the leading cause of preventable death. Quitting smoking can be hard, but it is one of the best things that you can do for your health.  When you decide to quit smoking, make a plan to help you succeed.  Quit smoking right away, not slowly over a period of time.  When you start quitting, seek help from your doctor, family, or friends. This information is not intended to replace advice given to you by your health care provider. Make sure you discuss any questions you have with your health care provider. Document Revised: 01/21/2019 Document Reviewed: 07/17/2018 Elsevier Patient Education  2021 [place] Maintenance Summary and Written Plan of Care  Jon [name] ,  Thank you for allowing me to perform your Medicare Annual Wellness Visit and for your ongoing commitment to your health.   Health Maintenance & Immunization History Health Maintenance  Topic Date Due  . COLONOSCOPY (Pts 45-63yrs Insurance coverage will need to be confirmed)  09/06/2020 (Originally 07/29/2016)  . COVID-19 Vaccine (4 - Booster for [place] series) 10/08/2020  . OPHTHALMOLOGY EXAM  10/30/2020  . HEMOGLOBIN A1C  12/02/2020  . FOOT EXAM  12/06/2020  . TETANUS/TDAP  05/21/2028  . INFLUENZA VACCINE  Completed  . Hepatitis C Screening  Completed  . PNA vac Low Risk Adult  Completed  . HPV VACCINES  Aged Out   Immunization History  Administered Date(s) Administered  . Fluad Quad(high Dose  65+) 02/09/2019, 03/07/2020  . Influenza Split 03/06/2011, 03/05/2012  . Influenza Whole 04/14/2009  . Influenza, High Dose Seasonal PF  03/26/2016, 01/27/2017, 02/09/2018  . Influenza,inj,Quad PF,6+ Mos 03/09/2013, 12/27/2013, 03/21/2015  . PFIZER(Purple Top)SARS-COV-2 Vaccination 09/02/2019, 09/27/2019, 04/10/2020  . Pneumococcal Conjugate-13 09/26/2014  . Pneumococcal Polysaccharide-23 03/15/2009, 12/03/2012  . Tdap 05/21/2018    These are the patient goals that we discussed: Goals Addressed              This Visit's Progress   .  Patient Stated (pt-stated)        07/25/2020 AWV Goal: Diabetes Management  . Patient will maintain an A1C level below 7 . Patient will not develop any diabetic foot complications . Patient will not experience any hypoglycemic episodes over the next 3 months . Patient will notify our office of any CBG readings outside of the provider recommended range by calling [phone] . Patient will adhere to provider recommendations for diabetes management  Patient Self Management Activities . take all medications as prescribed and report any negative side effects . monitor and record blood sugar readings as directed . adhere to a low carbohydrate diet that incorporates lean proteins, vegetables, whole grains, low glycemic fruits . check feet daily noting any sores, cracks, injuries, or callous formations . see PCP or podiatrist if he notices any changes in his legs, feet, or toenails . Patient will visit PCP and have an A1C level checked every 3 to 6 months as directed  . have a yearly eye exam to monitor for vascular changes associated with diabetes and will request that the report be sent to his pcp.  . consult with his PCP regarding any changes in his health or new or worsening symptoms         This is a list of Health Maintenance Items that are overdue or due now: Colorectal cancer screening  Shingrix  Orders/Referrals Placed Today: No orders of  the defined types were placed in this encounter.  (Contact our referral department at [phone] if you have not spoken with someone about your referral appointment within the next 5 days)    Follow-up Plan . Follow-up with [name], MD as planned . Please let us know if you change your mind about the colonoscopy and we would happy to send in a referral. . Medicare wellness exam in one year.

## 2020-07-25 NOTE — Progress Notes (Addendum)
MEDICARE ANNUAL WELLNESS VISIT  07/25/2020  Telephone Visit Disclaimer This Medicare AWV was conducted by telephone due to national recommendations for restrictions regarding the COVID-19 Pandemic (e.g. social distancing).  I verified, using two identifiers, that I am speaking with Jon [name] or their authorized healthcare agent. I discussed the limitations, risks, security, and privacy concerns of performing an evaluation and management service by telephone and the potential availability of an in-person appointment in the future. The patient expressed understanding and agreed to proceed.  Location of Patient: Home Location of Provider (nurse):  In the office.  Subjective:    Jon [name] is a 75 y.o. male patient of Metheney, Rene Kocher, MD who had a Medicare Annual Wellness Visit today via telephone. Jon [name] is Retired and lives alone. he has 2 children. he reports that he is socially active and does interact with friends/family regularly. he is minimally physically active and enjoys likes to play golf.  Patient Care Team: [name], MD as PCP - General (Family Medicine)  Advanced Directives 07/25/2020 12/11/2019 09/26/2013 09/06/2013  Does Patient Have a Medical Advance Directive? Yes No Patient would not like information;Patient does not have advance directive Patient does not have advance directive  Type of Advance Directive Living will - - -  Does patient want to make changes to medical advance directive? No - Patient declined - - -  Would patient like information on creating a medical advance directive? - No - Patient declined - -    Hospital Utilization Over the Past 12 Months: # of hospitalizations or ER visits: 0 # of surgeries: 0  Review of Systems    Patient reports that his overall health is unchanged compared to last year.  History obtained from chart review and the patient  Patient Reported Readings (BP, Pulse, CBG, Weight,  etc) CBG-117  Pain Assessment Pain : No/denies pain     Current Medications & Allergies (verified) Allergies as of 07/25/2020      Reactions   Gabapentin Other (See Comments)   Nightmares.    Lisinopril Other (See Comments)   Hypotension   Metformin And Related Diarrhea   Trazodone And Nefazodone Other (See Comments)   dizziness      Medication List       Accurate as of July 25, 2020  9:27 AM. If you have any questions, ask your nurse or doctor.        Accu-Chek Aviva Plus test strip Generic drug: glucose blood USE ONE STRIP TO CHECK  BLOOD SUGAR TWICE DAILY AS  DIRECTED   Accu-Chek Aviva Plus w/Device Kit Check fasting blood sugar daily and before meals. DM Diagnosis   AMBULATORY NON FORMULARY MEDICATION Medication Name: overnight pulse ox. Dx emphysema, Hypertension   atorvastatin 40 MG tablet Commonly known as: LIPITOR Take 40 mg by mouth daily.   B-D ULTRAFINE III SHORT PEN 31G X 8 MM Misc Generic drug: Insulin Pen Needle INJECT INTO SKIN DAILY.   clopidogrel 75 MG tablet Commonly known as: PLAVIX TAKE 1 TABLET BY MOUTH  DAILY   fish oil-omega-3 fatty acids 1000 MG capsule Take 4 g by mouth daily. Takes it once in a while   ketoconazole 2 % cream Commonly known as: NIZORAL APPLY TO AFFECTED AREA(S)  TOPICALLY ONCE DAILY   Levemir FlexTouch 100 UNIT/ML FlexPen Generic drug: insulin detemir Inject 50 Units into the skin daily. What changed: additional instructions   lisinopril 10 MG tablet Commonly known as: ZESTRIL TAKE 1 TABLET BY  MOUTH  DAILY   simvastatin 40 MG tablet Commonly known as: ZOCOR Take 1 tablet (40 mg total) by mouth at bedtime.   Synjardy 12.5-500 MG Tabs Generic drug: Empagliflozin-metFORMIN HCl TAKE 1 TABLET BY MOUTH  TWICE DAILY What changed:   how much to take  how to take this  when to take this  additional instructions   traZODone 50 MG tablet Commonly known as: DESYREL       History (reviewed): Past  Medical History:  Diagnosis Date  . BENIGN POSITIONAL VERTIGO 05/20/2010   Qualifier: Diagnosis of  By: Madilyn Fireman MD, Barnetta Chapel    . Diabetes mellitus   . Epididymitis   . Hyperlipemia   . Hypertension   . Stroke [facility]) 7/11   Past Surgical History:  Procedure Laterality Date  . PILONIDAL CYST EXCISION  1960, 62, 62, 64  . VASECTOMY     Family History  Problem Relation Age of Onset  . Diabetes Other        family hx of  . Colon cancer Neg Hx    Social History   Socioeconomic History  . Marital status: Divorced    Spouse name: Not on file  . Number of children: 2  . Years of education: 12th grade  . Highest education level: High school graduate  Occupational History  . Occupation: Retired  Tobacco Use  . Smoking status: Current Every Day Smoker    Packs/day: 1.00    Years: 60.00    Pack years: 60.00    Types: Cigarettes  . Smokeless tobacco: Never Used  . Tobacco comment: Counseling sheet given in exam room 07-2011   Substance and Sexual Activity  . Alcohol use: No  . Drug use: No  . Sexual activity: Not on file  Other Topics Concern  . Not on file  Social History Narrative   Lives alone and Likes to play golf 2 x 3 times a week.   Social Determinants of Health   Financial Resource Strain: Low Risk   . Difficulty of Paying Living Expenses: Not hard at all  Food Insecurity: No Food Insecurity  . Worried About Charity fundraiser in the Last Year: Never true  . Ran Out of Food in the Last Year: Never true  Transportation Needs: No Transportation Needs  . Lack of Transportation (Medical): No  . Lack of Transportation (Non-Medical): No  Physical Activity: Insufficiently Active  . Days of Exercise per Week: 3 days  . Minutes of Exercise per Session: 30 min  Stress: No Stress Concern Present  . Feeling of Stress : Not at all  Social Connections: Moderately Isolated  . Frequency of Communication with Friends and Family: More than three times a week  . Frequency of  Social Gatherings with Friends and Family: More than three times a week  . Attends Religious Services: Never  . Active Member of Clubs or Organizations: No  . Attends Archivist Meetings: Never  . Marital Status: Married    Activities of Daily Living In your present state of health, do you have any difficulty performing the following activities: 07/25/2020  Hearing? N  Vision? N  Difficulty concentrating or making decisions? Y  Comment little bit  Walking or climbing stairs? Y  Comment little bit  Dressing or bathing? N  Doing errands, shopping? N  Preparing Food and eating ? N  Using the Toilet? N  In the past six months, have you accidently leaked urine? N  Do you have problems  with loss of bowel control? N  Managing your Medications? N  Managing your Finances? N  Housekeeping or managing your Housekeeping? N  Some recent data might be hidden    Patient Education/ Literacy How often do you need to have someone help you when you read instructions, pamphlets, or other written materials from your doctor or pharmacy?: 1 - Never What is the last grade level you completed in school?: 12th grade  Exercise Current Exercise Habits: Home exercise routine, Type of exercise: walking, Time (Minutes): 30, Frequency (Times/Week): 3, Weekly Exercise (Minutes/Week): 90, Intensity: Moderate, Exercise limited by: None identified  Diet Patient reports consuming 2 meals a day and 2 snack(s) a day Patient reports that his primary diet is: Regular Patient reports that she does have regular access to food.   Depression Screen PHQ 2/9 Scores 07/25/2020 12/07/2019 09/07/2019 07/15/2018 05/18/2018 02/09/2018 01/27/2017  PHQ - 2 Score 0 0 0 0 0 0 0  PHQ- 9 Score 0 0 - 0 - - -     Fall Risk Fall Risk  07/25/2020 12/07/2019 09/07/2019 07/15/2018 05/18/2018  Falls in the past year? 0 0 0 0 0  Number falls in past yr: 0 0 - - 0  Injury with Fall? 0 0 0 - -  Risk for fall due to : No Fall Risks - No Fall  Risks - -  Follow up Falls evaluation completed Falls evaluation completed - Falls evaluation completed -     Objective:  Jon [name] seemed alert and oriented and he participated appropriately during our telephone visit.  Blood Pressure Weight BMI  BP Readings from Last 3 Encounters:  06/04/20 115/63  03/07/20 (!) 151/59  12/07/19 (!) 95/48   Wt Readings from Last 3 Encounters:  06/04/20 217 lb (98.4 kg)  03/07/20 206 lb 8 oz (93.7 kg)  12/07/19 (!) 212 lb (96.2 kg)   BMI Readings from Last 1 Encounters:  06/04/20 28.63 kg/m    *Unable to obtain current vital signs, weight, and BMI due to telephone visit type  Hearing/Vision  . San did not seem to have difficulty with hearing/understanding during the telephone conversation . Reports that he has had a formal eye exam by an eye care professional within the past year . Reports that he has had a formal hearing evaluation within the past year *Unable to fully assess hearing and vision during telephone visit type  Cognitive Function: 6CIT Screen 07/25/2020  What Year? 0 points  What month? 0 points  What time? 0 points  Count back from 20 0 points  Months in reverse 0 points  Repeat phrase 0 points  Total Score 0   (Normal:0-7, Significant for Dysfunction: >8)  Normal Cognitive Function Screening: Yes   Immunization & Health Maintenance Record Immunization History  Administered Date(s) Administered  . Fluad Quad(high Dose 65+) 02/09/2019, 03/07/2020  . Influenza Split 03/06/2011, 03/05/2012  . Influenza Whole 04/14/2009  . Influenza, High Dose Seasonal PF 03/26/2016, 01/27/2017, 02/09/2018  . Influenza,inj,Quad PF,6+ Mos 03/09/2013, 12/27/2013, 03/21/2015  . PFIZER(Purple Top)SARS-COV-2 Vaccination 09/02/2019, 09/27/2019, 04/10/2020  . Pneumococcal Conjugate-13 09/26/2014  . Pneumococcal Polysaccharide-23 03/15/2009, 12/03/2012  . Tdap 05/21/2018    Health Maintenance  Topic Date Due  . COLONOSCOPY (Pts  45-76yr Insurance coverage will need to be confirmed)  09/06/2020 (Originally 07/29/2016)  . COVID-19 Vaccine (4 - Booster for [place]) 10/08/2020  . OPHTHALMOLOGY EXAM  10/30/2020  . HEMOGLOBIN A1C  12/02/2020  . FOOT EXAM  12/06/2020  . TETANUS/TDAP  05/21/2028  . INFLUENZA VACCINE  Completed  . Hepatitis C Screening  Completed  . PNA vac Low Risk Adult  Completed  . HPV VACCINES  Aged Out       Assessment  This is a routine wellness examination for Jon [name].  Health Maintenance: Due or Overdue There are no preventive care reminders to display for this patient.  Jon [name] does not need a referral for Community Assistance: Care Management:   no Social Work:    no Prescription Assistance:  no Nutrition/Diabetes Education:  no   Plan:  Personalized Goals Goals Addressed              This Visit's Progress   .  Patient Stated (pt-stated)        07/25/2020 AWV Goal: Diabetes Management  . Patient will maintain an A1C level below 7 . Patient will not develop any diabetic foot complications . Patient will not experience any hypoglycemic episodes over the next 3 months . Patient will notify our office of any CBG readings outside of the provider recommended range by calling [phone] . Patient will adhere to provider recommendations for diabetes management  Patient Self Management Activities . take all medications as prescribed and report any negative side effects . monitor and record blood sugar readings as directed . adhere to a low carbohydrate diet that incorporates lean proteins, vegetables, whole grains, low glycemic fruits . check feet daily noting any sores, cracks, injuries, or callous formations . see PCP or podiatrist if he notices any changes in his legs, feet, or toenails . Patient will visit PCP and have an A1C level checked every 3 to 6 months as directed  . have a yearly eye exam to monitor for vascular changes associated with  diabetes and will request that the report be sent to his pcp.  . consult with his PCP regarding any changes in his health or new or worsening symptoms       Personalized Health Maintenance & Screening Recommendations  Colorectal cancer screening  Shingrix  Lung Cancer Screening Recommended: yes, patient stated he does not want one at this time. (Low Dose CT Chest recommended if Age 61-80 years, 30 pack-year currently smoking OR have quit w/in past 15 years) Hepatitis C Screening recommended: no HIV Screening recommended: no  Advanced Directives: Written information was not prepared per patient's request.  Referrals & Orders No orders of the defined types were placed in this encounter.   Follow-up Plan . Follow-up with [name], MD as planned . Please let us know if you change your mind about the colonoscopy and we would happy to send in a referral. . Medicare wellness exam in one year.   I have personally reviewed and noted the following in the patient's chart:   . Medical and social history . Use of alcohol, tobacco or illicit drugs  . Current medications and supplements . Functional ability and status . Nutritional status . Physical activity . Advanced directives . List of other physicians . Hospitalizations, surgeries, and ER visits in previous 12 months . Vitals . Screenings to include cognitive, depression, and falls . Referrals and appointments  In addition, I have reviewed and discussed with Jon [name] certain preventive protocols, quality metrics, and best practice recommendations. A written personalized care plan for preventive services as well as general preventive health recommendations is available and can be mailed to the patient at his request.      Tinnie Gens, RN  07/25/2020

## 2020-08-08 ENCOUNTER — Ambulatory Visit: Payer: Medicare Other | Admitting: Podiatry

## 2020-08-15 ENCOUNTER — Telehealth: Payer: Self-pay | Admitting: *Deleted

## 2020-08-15 NOTE — Telephone Encounter (Signed)
OK to hold lisinopril and see if feels better. We can always restart at a lower dose like 2.5 or 5mg  if needed to protect the kidneys

## 2020-08-15 NOTE — Telephone Encounter (Signed)
Called pt back he informed me that he has been experiencing drops in his bp.  He told me that he had started working and that his job is an Immunologist at [employer]. He has noticed that when he stands for 1-2 hours he gets weak and a little faint.  He has been monitoring his bp and his systolic on the R arm range between 72-139, and his L arm is between 52-109.  Pt read up on this from the [facility] clinic website and it said that he may have Orthostatic hypertension.   Pt stated that he was going to STOP the Lisinopril 10 mg for a few days. He has already quit his job (the job was just something for him to get out of the house).  He will call back and let us know how his bp is going and if needed he will schedule an appt.

## 2020-08-16 NOTE — Telephone Encounter (Signed)
Called pt and gave him Dr. Gardiner Ramus recommendations.

## 2020-09-03 ENCOUNTER — Ambulatory Visit: Payer: Medicare Other | Admitting: Family Medicine

## 2020-09-07 ENCOUNTER — Other Ambulatory Visit: Payer: Self-pay | Admitting: Family Medicine

## 2020-09-07 DIAGNOSIS — E119 Type 2 diabetes mellitus without complications: Secondary | ICD-10-CM

## 2020-09-24 ENCOUNTER — Telehealth: Payer: Self-pay | Admitting: *Deleted

## 2020-09-24 ENCOUNTER — Telehealth: Payer: Self-pay | Admitting: Family Medicine

## 2020-09-24 DIAGNOSIS — E118 Type 2 diabetes mellitus with unspecified complications: Secondary | ICD-10-CM

## 2020-09-24 MED ORDER — CAREFINE PEN NEEDLES 31G X 8 MM MISC: 3 refills | Status: DC

## 2020-09-24 NOTE — Telephone Encounter (Signed)
Pt stated that his pen needles are held up in [place] and will need a new prescription sent for another brand other than BD.

## 2020-09-24 NOTE — Progress Notes (Signed)
  Chronic Care Management   Outreach Note  09/24/2020 Name: Jon [name] MRN: [id number] DOB: [birth year]  Referred by: [name], MD Reason for referral : No chief complaint on file.   An unsuccessful telephone outreach was attempted today. The patient was referred to the pharmacist for assistance with care management and care coordination.   Follow Up Plan:   Lauretta Grill Upstream Scheduler

## 2020-09-28 ENCOUNTER — Other Ambulatory Visit: Payer: Self-pay | Admitting: Family Medicine

## 2020-10-10 ENCOUNTER — Telehealth: Payer: Self-pay | Admitting: Family Medicine

## 2020-10-10 NOTE — Progress Notes (Signed)
  Chronic Care Management   Outreach Note  10/10/2020 Name: Jon [name] MRN: [id number] DOB: [birth year]  Referred by: [name], MD Reason for referral : No chief complaint on file.   A second unsuccessful telephone outreach was attempted today. The patient was referred to pharmacist for assistance with care management and care coordination.  Follow Up Plan:   Lauretta Grill Upstream Scheduler

## 2020-11-01 ENCOUNTER — Other Ambulatory Visit: Payer: Self-pay | Admitting: Family Medicine

## 2020-11-05 ENCOUNTER — Telehealth: Payer: Self-pay | Admitting: Family Medicine

## 2020-11-05 NOTE — Chronic Care Management (AMB) (Signed)
  Chronic Care Management   Note  11/05/2020 Name: Jon [name] MRN: [id number] DOB: 11-21-1945  Jon [name] is a 75 y.o. year old male who is a primary care patient of Madilyn Fireman, Rene Kocher, MD. I reached out to [name] by phone today in response to a referral sent by Jon [name] PCP, [name], MD.   Jon [name] was given information about Chronic Care Management services today including:  CCM service includes personalized support from designated clinical staff supervised by his physician, including individualized plan of care and coordination with other care providers 24/7 contact phone numbers for assistance for urgent and routine care needs. Service will only be billed when office clinical staff spend 20 minutes or more in a month to coordinate care. Only one practitioner may furnish and bill the service in a calendar month. The patient may stop CCM services at any time (effective at the end of the month) by phone call to the office staff.   Patient agreed to services and verbal consent obtained.   Follow up plan:   Jon [name] Upstream Scheduler

## 2020-11-05 NOTE — Progress Notes (Signed)
  Chronic Care Management   Outreach Note  11/05/2020 Name: Jon [name] MRN: [id number] DOB: Nov 06, 1945  Referred by: [name], MD Reason for referral : No chief complaint on file.   Third unsuccessful telephone outreach was attempted today. The patient was referred to the pharmacist for assistance with care management and care coordination.   Follow Up Plan:   Lauretta Grill Upstream Scheduler

## 2020-11-19 ENCOUNTER — Other Ambulatory Visit: Payer: Self-pay | Admitting: Family Medicine

## 2020-11-20 ENCOUNTER — Telehealth: Payer: Self-pay | Admitting: Pharmacist

## 2020-11-20 NOTE — Chronic Care Management (AMB) (Signed)
    Chronic Care Management Pharmacy Assistant   Name: Jon [name]  MRN: [id number] DOB: 1946/04/13  [name] is an 75 y.o. year old male who presents for his initial CCM visit with the clinical pharmacist.  Reason for Encounter: Initial CCM Visit   Recent office visits:  08/15/2020 (TE) Beatrice Lecher, MD - Patient reported drops in blood pressure. Patient advised to hold Lisinopril.  07/25/2020 Beatrice Lecher, MD - Medicare Wellness visit completed.  06/04/2020 Beatrice Lecher, MD - Seen for general follow up. Lab ordered. Follow up in 3 months.  Recent consult visits:  No visits noted.  Hospital visits:  None in previous 6 months  Medications: Outpatient Encounter Medications as of 11/20/2020  Medication Sig   AMBULATORY NON FORMULARY MEDICATION Medication Name: overnight pulse ox. Dx emphysema, Hypertension (Patient not taking: Reported on 07/25/2020)   atorvastatin (LIPITOR) 40 MG tablet Take 1 tablet (40 mg total) by mouth daily. Needs lab work.   Blood Glucose Monitoring Suppl (ACCU-CHEK AVIVA PLUS) w/Device KIT Check fasting blood sugar daily and before meals. DM Diagnosis   clopidogrel (PLAVIX) 75 MG tablet TAKE 1 TABLET BY MOUTH  DAILY   fish oil-omega-3 fatty acids 1000 MG capsule Take 4 g by mouth daily. Takes it once in a while   glucose blood (ACCU-CHEK AVIVA PLUS) test strip USE 1 STRIP TO CHECK BLOOD  SUGAR TWICE DAILY AS  DIRECTED   insulin detemir (LEVEMIR FLEXTOUCH) 100 UNIT/ML FlexPen Inject 50 Units into the skin daily. (Patient taking differently: Inject 50 Units into the skin daily. Takes 30 units once a day)   Insulin Pen Needle (CAREFINE PEN NEEDLES) 31G X 8 MM MISC For use with injecting insulin daily   ketoconazole (NIZORAL) 2 % cream APPLY TO AFFECTED AREA(S)  TOPICALLY ONCE DAILY   lisinopril (ZESTRIL) 10 MG tablet TAKE 1 TABLET BY MOUTH  DAILY   SYNJARDY 12.5-500 MG TABS TAKE 1 TABLET BY MOUTH  TWICE DAILY (Patient taking  differently: $RemoveBefor'500mg'sewXogiymyWP$ )   traZODone (DESYREL) 50 MG tablet TAKE 1/2 TO 1 TABLET BY  MOUTH AT BEDTIME AS NEEDED  FOR SLEEP   No facility-administered encounter medications on file as of 11/20/2020.  Medications atorvastatin (LIPITOR) 40 MG tablet last filled 08/31/2020 90 DS clopidogrel (PLAVIX) 75 MG tablet last filled 09/20/2020 90 DS fish oil-omega-3 fatty acids 1000 MG capsule insulin detemir (LEVEMIR FLEXTOUCH) 100 UNIT/ML FlexPen last filled 11/15/2020 90 DS ketoconazole (NIZORAL) 2 % cream last filled 05/04/2020 90 DS lisinopril (ZESTRIL) 10 MG tablet last filled 09/20/2020 90 DS SYNJARDY 12.5-500 MG TABS last filled 08/04/2020 90 DS trazodone (DESYREL) 50 MG tablet last filled 09/24/2020 90 DS   Star Rating Drugs: atorvastatin (LIPITOR) 40 MG tablet last filled 08/31/2020 90 DS isinopril (ZESTRIL) 10 MG tablet last filled 09/20/2020 90 DS SYNJARDY 12.5-500 MG TABS last filled 08/04/2020 90 DS  [place] Pharmacist Assistant [phone]

## 2020-11-22 ENCOUNTER — Encounter: Payer: Self-pay | Admitting: Family Medicine

## 2020-11-22 ENCOUNTER — Other Ambulatory Visit: Payer: Self-pay

## 2020-11-22 ENCOUNTER — Other Ambulatory Visit: Payer: Self-pay | Admitting: *Deleted

## 2020-11-22 ENCOUNTER — Ambulatory Visit (INDEPENDENT_AMBULATORY_CARE_PROVIDER_SITE_OTHER): Payer: Medicare Other | Admitting: Family Medicine

## 2020-11-22 VITALS — BP 88/54 | HR 44 | Ht 73.0 in | Wt 208.0 lb

## 2020-11-22 DIAGNOSIS — E782 Mixed hyperlipidemia: Secondary | ICD-10-CM | POA: Diagnosis not present

## 2020-11-22 DIAGNOSIS — I771 Stricture of artery: Secondary | ICD-10-CM

## 2020-11-22 DIAGNOSIS — E118 Type 2 diabetes mellitus with unspecified complications: Secondary | ICD-10-CM | POA: Diagnosis not present

## 2020-11-22 DIAGNOSIS — R7989 Other specified abnormal findings of blood chemistry: Secondary | ICD-10-CM

## 2020-11-22 DIAGNOSIS — I10 Essential (primary) hypertension: Secondary | ICD-10-CM

## 2020-11-22 DIAGNOSIS — N183 Chronic kidney disease, stage 3 unspecified: Secondary | ICD-10-CM

## 2020-11-22 DIAGNOSIS — E1122 Type 2 diabetes mellitus with diabetic chronic kidney disease: Secondary | ICD-10-CM | POA: Diagnosis not present

## 2020-11-22 DIAGNOSIS — Z125 Encounter for screening for malignant neoplasm of prostate: Secondary | ICD-10-CM

## 2020-11-22 LAB — POCT GLYCOSYLATED HEMOGLOBIN (HGB A1C): Hemoglobin A1C: 7.4 % — AB (ref 4.0–5.6)

## 2020-11-22 NOTE — Assessment & Plan Note (Signed)
We did discuss taking at least a low-dose of lisinopril I think he would still benefit from at least a 2.5 mg even though his blood pressure is low though it always is lower in his left arm.  He does have CKD 3 with diabetes.  He says he thinks he might still have some 2.5's at home so he will look and if not he will call us back and we can send in a prescription.

## 2020-11-22 NOTE — Progress Notes (Signed)
Pt reports that he is taking 40 units of levemir.  He stopped taking lisinopril 10 mg 2 mos ago.

## 2020-11-22 NOTE — Assessment & Plan Note (Signed)
A1c is much better at 7.4, it was previously 7.9 I do think the bump in the Levemir has been helpful.  For now continue with 45 units of a still really encourage him to work on diet he does eat a lot of sweets in the afternoon and I feel like if he could just cut back on that somewhat we could get the A1c hopefully under 7 if not then we can make adjustments when I see him back.  Continue statin and low-dose ACE inhibitor.

## 2020-11-22 NOTE — Assessment & Plan Note (Signed)
Has noticed that he has a little bit more bruising on the left arm.

## 2020-11-22 NOTE — Progress Notes (Signed)
Established Patient Office Visit  Subjective:  Patient ID: Jon [name], male    DOB: April 13, 1946  Age: 75 y.o. MRN: [id number]  CC:  Chief Complaint  Patient presents with   Diabetes    HPI [name] presents for   Diabetes - no hypoglycemic events. No wounds or sores that are not healing well. No increased thirst or urination. Checking glucose at home. Taking medications as prescribed without any side effects.  About 2 months ago he did go up to about 45 units on his Levemir and says has been doing that pretty consistently fasting blood sugars in the morning have been running between 1 20-1 40 about 90% of the time.  He does have a couple of concerns that he would like me to address today.  1 is that he is noticed a little bit of a ridge on the outside of his right knee is not painful or bothersome he says he just noticed it maybe a month or 2 ago he is not sure how long its actually been there.  He also reports that he wakes up feeling like his neck is somewhat painful and stiff it is hard to completely rotate to the right and left and after couple hours he gets improved range of motion once in a while he will actually get a spasm in the posterior neck as well and they are quite painful when it happens but he can usually try to relax and get it to go away. He does report that for years he has had reflux symptoms especially at night to the point where it would wake him up feeling short of breath and gasping so he started sleeping on 2 pillows a couple years ago.  He wonders if that could even be contributing to some of his neck pain.  He also reports that once in a while he will get pain over that left iliac crest usually it is when he is turning or twisting his low back and will just get a sudden sharp pain and then it eases off it does not happen very often but he just wanted to mention it today.  Past Medical History:  Diagnosis Date   BENIGN POSITIONAL VERTIGO 05/20/2010    Qualifier: Diagnosis of  By: Linford Arnold MD, Josephmichael Lisenbee     Diabetes mellitus    Epididymitis    Hyperlipemia    Hypertension    Stroke [facility]) 7/11    Past Surgical History:  Procedure Laterality Date   PILONIDAL CYST EXCISION  1960, 61, 62, 14   VASECTOMY      Family History  Problem Relation Age of Onset   Diabetes Other        family hx of   Colon cancer Neg Hx     Social History   Socioeconomic History   Marital status: Divorced    Spouse name: Not on file   Number of children: 2   Years of education: 12th grade   Highest education level: High school graduate  Occupational History   Occupation: Retired  Tobacco Use   Smoking status: Every Day    Packs/day: 1.00    Years: 60.00    Pack years: 60.00    Types: Cigarettes   Smokeless tobacco: Never   Tobacco comments:    Counseling sheet given in exam room 07-2011   Substance and Sexual Activity   Alcohol use: No   Drug use: No   Sexual activity: Not on file  Other  Topics Concern   Not on file  Social History Narrative   Lives alone and Likes to play golf 2 x 3 times a week.   Social Determinants of Health   Financial Resource Strain: Low Risk    Difficulty of Paying Living Expenses: Not hard at all  Food Insecurity: No Food Insecurity   Worried About Charity fundraiser in the Last Year: Never true   [place] in the Last Year: Never true  Transportation Needs: No Transportation Needs   Lack of Transportation (Medical): No   Lack of Transportation (Non-Medical): No  Physical Activity: Insufficiently Active   Days of Exercise per Week: 3 days   Minutes of Exercise per Session: 30 min  Stress: No Stress Concern Present   Feeling of Stress : Not at all  Social Connections: Moderately Isolated   Frequency of Communication with Friends and Family: More than three times a week   Frequency of Social Gatherings with Friends and Family: More than three times a week   Attends Religious Services: Never    Marine scientist or Organizations: No   Attends Music therapist: Never   Marital Status: Married  Human resources officer Violence: Not At Risk   Fear of Current or Ex-Partner: No   Emotionally Abused: No   Physically Abused: No   Sexually Abused: No    Outpatient Medications Prior to Visit  Medication Sig Dispense Refill   atorvastatin (LIPITOR) 40 MG tablet Take 1 tablet (40 mg total) by mouth daily. Needs lab work. 90 tablet 0   Blood Glucose Monitoring Suppl (ACCU-CHEK AVIVA PLUS) w/Device KIT Check fasting blood sugar daily and before meals. DM Diagnosis 1 kit prn   clopidogrel (PLAVIX) 75 MG tablet TAKE 1 TABLET BY MOUTH  DAILY 90 tablet 3   fish oil-omega-3 fatty acids 1000 MG capsule Take 4 g by mouth daily. Takes it once in a while     glucose blood (ACCU-CHEK AVIVA PLUS) test strip USE 1 STRIP TO CHECK BLOOD  SUGAR TWICE DAILY AS  DIRECTED 200 strip 3   insulin detemir (LEVEMIR FLEXTOUCH) 100 UNIT/ML FlexPen Inject 50 Units into the skin daily. (Patient taking differently: Inject 50 Units into the skin daily. Takes 30 units once a day) 45 mL 3   Insulin Pen Needle (CAREFINE PEN NEEDLES) 31G X 8 MM MISC For use with injecting insulin daily 100 each 3   ketoconazole (NIZORAL) 2 % cream APPLY TO AFFECTED AREA(S)  TOPICALLY ONCE DAILY 45 g 3   SYNJARDY 12.5-500 MG TABS TAKE 1 TABLET BY MOUTH  TWICE DAILY (Patient taking differently: 500mg ) 180 tablet 3   traZODone (DESYREL) 50 MG tablet TAKE 1/2 TO 1 TABLET BY  MOUTH AT BEDTIME AS NEEDED  FOR SLEEP 90 tablet 3   lisinopril (ZESTRIL) 10 MG tablet TAKE 1 TABLET BY MOUTH  DAILY (Patient not taking: Reported on 11/22/2020) 90 tablet 3   AMBULATORY NON FORMULARY MEDICATION Medication Name: overnight pulse ox. Dx emphysema, Hypertension (Patient not taking: No sig reported) 1 Units 0   No facility-administered medications prior to visit.    Allergies  Allergen Reactions   Gabapentin Other (See Comments)    Nightmares.     Lisinopril Other (See Comments)    Hypotension   Metformin And Related Diarrhea   Trazodone And Nefazodone Other (See Comments)    dizziness    ROS Review of Systems    Objective:    Physical Exam Constitutional:  Appearance: Normal appearance. He is well-developed.  HENT:     Head: Normocephalic and atraumatic.  Cardiovascular:     Rate and Rhythm: Normal rate and regular rhythm.     Heart sounds: Normal heart sounds.  Pulmonary:     Effort: Pulmonary effort is normal.     Breath sounds: Normal breath sounds.  Musculoskeletal:     Comments: He does have a palpable bony ridge at the joint line on the lateral right knee.  It is nontender it does not feel cystic or soft.  It really just looks like he has excess calcification at the edge of the bone.  Normal flexion and extension.  Nontender along the medial joint line or around the patella.  Neck with NROM  Skin:    General: Skin is warm and dry.  Neurological:     Mental Status: He is alert and oriented to person, place, and time. Mental status is at baseline.  Psychiatric:        Behavior: Behavior normal.    BP (!) 88/54 (BP Location: Left Arm, Cuff Size: Normal)   Pulse (!) 44   Ht $R'6\' 1"'Iy$  (1.854 m)   Wt 208 lb (94.3 kg)   SpO2 99%   BMI 27.44 kg/m  Wt Readings from Last 3 Encounters:  11/22/20 208 lb (94.3 kg)  06/04/20 217 lb (98.4 kg)  03/07/20 206 lb 8 oz (93.7 kg)     Health Maintenance Due  Topic Date Due   Zoster Vaccines- Shingrix (1 of 2) Never done   COLONOSCOPY (Pts 45-27yrs Insurance coverage will need to be confirmed)  07/29/2016   COVID-19 Vaccine (4 - Booster for [place] series) 07/09/2020   OPHTHALMOLOGY EXAM  10/30/2020    There are no preventive care reminders to display for this patient.  Lab Results  Component Value Date   TSH 7.07 (H) 09/07/2019   Lab Results  Component Value Date   WBC 6.1 12/11/2019   HGB 14.6 12/11/2019   HCT 42.9 12/11/2019   MCV 97.5 12/11/2019   PLT  164 12/11/2019   Lab Results  Component Value Date   NA 138 12/11/2019   K 4.7 12/11/2019   CO2 25 12/11/2019   GLUCOSE 244 (H) 12/11/2019   BUN 17 12/11/2019   CREATININE 1.44 (H) 12/11/2019   BILITOT 0.5 12/11/2019   ALKPHOS 54 12/11/2019   AST 19 12/11/2019   ALT 23 12/11/2019   PROT 7.6 12/11/2019   ALBUMIN 4.5 12/11/2019   CALCIUM 9.3 12/11/2019   ANIONGAP 10 12/11/2019   Lab Results  Component Value Date   CHOL 166 09/07/2019   Lab Results  Component Value Date   HDL 46 09/07/2019   Lab Results  Component Value Date   LDLCALC 93 09/07/2019   Lab Results  Component Value Date   TRIG 175 (H) 09/07/2019   Lab Results  Component Value Date   CHOLHDL 3.6 09/07/2019   Lab Results  Component Value Date   HGBA1C 7.4 (A) 11/22/2020      Assessment & Plan:   Problem List Items Addressed This Visit       Cardiovascular and Mediastinum   Subclavian arterial stenosis ([place])    Has noticed that he has a little bit more bruising on the left arm.       RESOLVED: Essential hypertension   Relevant Orders   TSH   PSA   CBC   Lipid panel   COMPLETE METABOLIC PANEL WITH GFR  Endocrine   Controlled diabetes mellitus type 2 with complications (HCC) - Primary    A1c is much better at 7.4, it was previously 7.9 I do think the bump in the Levemir has been helpful.  For now continue with 45 units of a still really encourage him to work on diet he does eat a lot of sweets in the afternoon and I feel like if he could just cut back on that somewhat we could get the A1c hopefully under 7 if not then we can make adjustments when I see him back.  Continue statin and low-dose ACE inhibitor.       Relevant Orders   TSH   PSA   CBC   Lipid panel   COMPLETE METABOLIC PANEL WITH GFR   POCT glycosylated hemoglobin (Hb A1C) (Completed)   CKD stage 3 due to type 2 diabetes mellitus ([place])    We did discuss taking at least a low-dose of lisinopril I think he would still  benefit from at least a 2.5 mg even though his blood pressure is low though it always is lower in his left arm.  He does have CKD 3 with diabetes.  He says he thinks he might still have some 2.5's at home so he will look and if not he will call us back and we can send in a prescription.         Other   Screening for prostate cancer   Relevant Orders   TSH   PSA   CBC   Lipid panel   COMPLETE METABOLIC PANEL WITH GFR   Hyperlipidemia   Relevant Orders   TSH   PSA   CBC   Lipid panel   COMPLETE METABOLIC PANEL WITH GFR   Abnormal TSH   Relevant Orders   TSH   PSA   CBC   Lipid panel   COMPLETE METABOLIC PANEL WITH GFR    In regards to that left lateral hip pain I suspect it is related to radiculopathy in the lumbar spine.  It is very intermittent and I think at this point does not need any specific intervention or treatment.  Cervical pain-we discussed that it is probably somewhat positional as he is sleeping he is probably overextending the spine and that is causing a lot of stiffness with rotation in the morning.  No orders of the defined types were placed in this encounter.   Follow-up: Return in about 3 months (around 02/22/2021) for Diabetes follow-up.    Beatrice Lecher, MD

## 2020-11-23 ENCOUNTER — Ambulatory Visit (INDEPENDENT_AMBULATORY_CARE_PROVIDER_SITE_OTHER): Payer: Medicare Other | Admitting: Pharmacist

## 2020-11-23 DIAGNOSIS — I10 Essential (primary) hypertension: Secondary | ICD-10-CM

## 2020-11-23 DIAGNOSIS — E118 Type 2 diabetes mellitus with unspecified complications: Secondary | ICD-10-CM | POA: Diagnosis not present

## 2020-11-23 DIAGNOSIS — E782 Mixed hyperlipidemia: Secondary | ICD-10-CM | POA: Diagnosis not present

## 2020-11-23 LAB — COMPLETE METABOLIC PANEL WITH GFR
AG Ratio: 1.9 (calc) (ref 1.0–2.5)
ALT: 22 U/L (ref 9–46)
AST: 16 U/L (ref 10–35)
Albumin: 4.6 g/dL (ref 3.6–5.1)
Alkaline phosphatase (APISO): 62 U/L (ref 35–144)
BUN/Creatinine Ratio: 10 (calc) (ref 6–22)
BUN: 17 mg/dL (ref 7–25)
CO2: 24 mmol/L (ref 20–32)
Calcium: 9.6 mg/dL (ref 8.6–10.3)
Chloride: 110 mmol/L (ref 98–110)
Creat: 1.74 mg/dL — ABNORMAL HIGH (ref 0.70–1.28)
Globulin: 2.4 g/dL (calc) (ref 1.9–3.7)
Glucose, Bld: 114 mg/dL — ABNORMAL HIGH (ref 65–99)
Potassium: 4.9 mmol/L (ref 3.5–5.3)
Sodium: 142 mmol/L (ref 135–146)
Total Bilirubin: 0.5 mg/dL (ref 0.2–1.2)
Total Protein: 7 g/dL (ref 6.1–8.1)
eGFR: 41 mL/min/{1.73_m2} — ABNORMAL LOW (ref >=60)

## 2020-11-23 LAB — CBC
HCT: 46.1 % (ref 38.5–50.0)
Hemoglobin: 15.4 g/dL (ref 13.2–17.1)
MCH: 32.7 pg (ref 27.0–33.0)
MCHC: 33.4 g/dL (ref 32.0–36.0)
MCV: 97.9 fL (ref 80.0–100.0)
MPV: 11.8 fL (ref 7.5–12.5)
Platelets: 169 10*3/uL (ref 140–400)
RBC: 4.71 10*6/uL (ref 4.20–5.80)
RDW: 11.8 % (ref 11.0–15.0)
WBC: 7.3 10*3/uL (ref 3.8–10.8)

## 2020-11-23 LAB — LIPID PANEL
Cholesterol: 145 mg/dL (ref <200)
HDL: 42 mg/dL (ref >=40)
LDL Cholesterol (Calc): 78 mg/dL (calc)
Non-HDL Cholesterol (Calc): 103 mg/dL (calc) (ref <130)
Total CHOL/HDL Ratio: 3.5 (calc) (ref <5.0)
Triglycerides: 157 mg/dL — ABNORMAL HIGH (ref <150)

## 2020-11-23 LAB — TSH: TSH: 5.14 mIU/L — ABNORMAL HIGH (ref 0.40–4.50)

## 2020-11-23 LAB — PSA: PSA: 0.12 ng/mL (ref <=4.00)

## 2020-11-23 NOTE — Progress Notes (Signed)
Chronic Care Management Pharmacy Note  11/23/2020 Name:  Jon [name] MRN:  [id number] DOB:  February 04, 1946  Summary: addressed DM, HTN, HLD, tobacco use  Recommendations/Changes made from today's visit: consider lisinopril 2.5mg  daily for renal protection  Plan: f/u with pharmacist in 6 months  Subjective: Jon [name] is an 75 y.o. year old male who is a primary patient of Metheney, Rene Kocher, MD.  The CCM team was consulted for assistance with disease management and care coordination needs.    Engaged with patient by telephone for initial visit in response to provider referral for pharmacy case management and/or care coordination services.   Consent to Services:  The patient was given information about Chronic Care Management services, agreed to services, and gave verbal consent prior to initiation of services.  Please see initial visit note for detailed documentation.   Patient Care Team: [name], MD as PCP - General (Family Medicine) Darius Bump, [facility] as Pharmacist (Pharmacist)  Recent office visits:  08/15/2020 Orinda Kenner) Beatrice Lecher, MD - Patient reported drops in blood pressure. Patient advised to hold Lisinopril.   07/25/2020 Beatrice Lecher, MD - Medicare Wellness visit completed.   06/04/2020 Beatrice Lecher, MD - Seen for general follow up. Lab ordered. Follow up in 3 months.   Recent consult visits:  No visits noted.   Hospital visits:  None in previous 6 months  Objective:  Lab Results  Component Value Date   CREATININE 1.74 (H) 11/22/2020   CREATININE 1.44 (H) 12/11/2019   CREATININE 1.37 (H) 09/07/2019    Lab Results  Component Value Date   HGBA1C 7.4 (A) 11/22/2020   Last diabetic Eye exam:  Lab Results  Component Value Date/Time   HMDIABEYEEXA No Retinopathy 10/31/2019 12:00 AM    Last diabetic Foot exam:  Lab Results  Component Value Date/Time   HMDIABFOOTEX yes 05/20/2010 12:00 AM        Component  Value Date/Time   CHOL 145 11/22/2020 1420   TRIG 157 (H) 11/22/2020 1420   HDL 42 11/22/2020 1420   CHOLHDL 3.5 11/22/2020 1420   VLDL 39 (H) 12/25/2015 1119   LDLCALC 78 11/22/2020 1420    Hepatic Function Latest Ref Rng & Units 11/22/2020 12/11/2019 09/07/2019  Total Protein 6.1 - 8.1 g/dL 7.0 7.6 7.1  Albumin 3.5 - 5.0 g/dL - 4.5 -  AST 10 - 35 U/L 16 19 16   ALT 9 - 46 U/L 22 23 22   Alk Phosphatase 38 - 126 U/L - 54 -  Total Bilirubin 0.2 - 1.2 mg/dL 0.5 0.5 0.3    Lab Results  Component Value Date/Time   TSH 5.14 (H) 11/22/2020 02:20 PM   TSH 7.07 (H) 09/07/2019 12:33 AM   FREET4 1.1 12/22/2019 10:39 AM   FREET4 1.1 09/07/2019 12:33 AM    CBC Latest Ref Rng & Units 11/22/2020 12/11/2019 09/07/2019  WBC 3.8 - 10.8 Thousand/uL 7.3 6.1 6.7  Hemoglobin 13.2 - 17.1 g/dL 15.4 14.6 14.5  Hematocrit 38.5 - 50.0 % 46.1 42.9 41.8  Platelets 140 - 400 Thousand/uL 169 164 168    Social History   Tobacco Use  Smoking Status Every Day   Packs/day: 1.00   Years: 60.00   Pack years: 60.00   Types: Cigarettes  Smokeless Tobacco Never  Tobacco Comments   Counseling sheet given in exam room 07-2011    BP Readings from Last 3 Encounters:  11/22/20 (!) 88/54  06/04/20 115/63  03/07/20 (!) 151/59   Pulse  Readings from Last 3 Encounters:  11/22/20 (!) 44  06/04/20 72  03/07/20 (!) 42   Wt Readings from Last 3 Encounters:  11/22/20 208 lb (94.3 kg)  06/04/20 217 lb (98.4 kg)  03/07/20 206 lb 8 oz (93.7 kg)    Assessment: Review of patient past medical history, allergies, medications, health status, including review of consultants reports, laboratory and other test data, was performed as part of comprehensive evaluation and provision of chronic care management services.   SDOH:  (Social Determinants of Health) assessments and interventions performed:    CCM Care Plan  Allergies  Allergen Reactions   Gabapentin Other (See Comments)    Nightmares.    Lisinopril Other (See  Comments)    Hypotension   Metformin And Related Diarrhea   Trazodone And Nefazodone Other (See Comments)    dizziness    Medications Reviewed Today     Reviewed by [name], MD (Physician) on 11/22/20 at [place] List Status: <None>   Medication Order Taking? Sig Documenting Provider Last Dose Status Informant  atorvastatin (LIPITOR) 40 MG tablet [id number] Yes Take 1 tablet (40 mg total) by mouth daily. Needs lab work. [name], MD Taking Active   Blood Glucose Monitoring Suppl (ACCU-CHEK AVIVA PLUS) w/Device KIT [id number] Yes Check fasting blood sugar daily and before meals. DM Diagnosis [name], MD Taking Active   clopidogrel (PLAVIX) 75 MG tablet [id number] Yes TAKE 1 TABLET BY MOUTH  DAILY [name], MD Taking Active   fish oil-omega-3 fatty acids 1000 MG capsule [id number] Yes Take 4 g by mouth daily. Takes it once in a while [provider] Taking Active   glucose blood (ACCU-CHEK AVIVA PLUS) test strip [id number] Yes USE 1 STRIP TO CHECK BLOOD  SUGAR TWICE DAILY AS  DIRECTED [name], MD Taking Active   insulin detemir (LEVEMIR FLEXTOUCH) 100 UNIT/ML FlexPen [id number] Yes Inject 50 Units into the skin daily.  Patient taking differently: Inject 50 Units into the skin daily. Takes 30 units once a day   [name], MD Taking Active   Insulin Pen Needle (CAREFINE PEN NEEDLES) 31G X 8 MM MISC [id number] Yes For use with injecting insulin daily [name], MD Taking Active   ketoconazole (NIZORAL) 2 % cream [id number] Yes APPLY TO AFFECTED AREA(S)  TOPICALLY ONCE DAILY [name], MD Taking Active   lisinopril (ZESTRIL) 10 MG tablet [id number] No TAKE 1 TABLET BY MOUTH  DAILY  Patient not taking: Reported on 11/22/2020   [name], MD Not Taking Active   SYNJARDY 12.5-500 MG TABS [id number] Yes TAKE 1 TABLET BY MOUTH  TWICE DAILY  Patient taking differently: 500mg    [name], MD Taking Active   traZODone (DESYREL) 50 MG tablet [id number] Yes TAKE 1/2 TO 1 TABLET BY  MOUTH AT BEDTIME AS NEEDED  FOR SLEEP [name], MD Taking Active             Patient Active Problem List   Diagnosis Date Noted   Postural dizziness with near syncope 03/08/2020   Subclavian arterial stenosis ([place]) 12/07/2019   Abnormal TSH 12/07/2019   Insomnia 10/01/2018   Pulmonary nodule 07/15/2018   Cerebrovascular disease 07/15/2018   History of hypertension 12/02/2017   Atherosclerotic vascular disease 12/02/2017   Continuous dependence on cigarette smoking 12/02/2017   History of stroke 12/02/2017   Shortness of breath on exertion 12/02/2017   Centrilobular emphysema ([place]) 11/03/2017  H/O parotidectomy 06/02/2017   Parotid mass 05/07/2017   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 ([place]) 07/30/2016   CKD stage 3 due to type 2 diabetes mellitus ([place]) 03/22/2015   Screening for prostate cancer 07/22/2011   Diabetic peripheral neuropathy ([place]) 06/05/2011   ROTATOR CUFF SYNDROME, RIGHT 03/22/2010   History of cardiovascular disorder 02/28/2010   Controlled diabetes mellitus type 2 with complications ([place]) 94/58/5929   Hyperlipidemia 01/31/2010   TOBACCO ABUSE 01/31/2010    Immunization History  Administered Date(s) Administered   Fluad Quad(high Dose 65+) 02/09/2019, 03/07/2020   Influenza Split 03/06/2011, 03/05/2012   Influenza Whole 04/14/2009   Influenza, High Dose Seasonal PF 03/26/2016, 01/27/2017, 02/09/2018   Influenza,inj,Quad PF,6+ Mos 03/09/2013, 12/27/2013, 03/21/2015   PFIZER(Purple Top)SARS-COV-2 Vaccination 09/02/2019, 09/27/2019, 04/10/2020   Pneumococcal Conjugate-13 09/26/2014   Pneumococcal Polysaccharide-23 03/15/2009, 12/03/2012   Tdap 05/21/2018    Conditions to be addressed/monitored: HTN, HLD, DMII, and tobacco use  There are no care plans that you recently modified to  display for this patient.   Medication Assistance: None required.  Patient affirms current coverage meets needs.  Patient's preferred pharmacy is:  [employer]  [facility]) - [place], [place] [address] Phone: [phone] Fax: [place] [address] ([address]), [place] - [place]) [place] [address] Phone: [phone] Fax: [phone]  Uses pill box? No - leaves in bottles and doing fine at this time Pt endorses 100% compliance  Follow Up:  Patient agrees to Care Plan and Follow-up.  Plan: Telephone follow up appointment with care management team member scheduled for:  6 months  Darius Bump

## 2020-11-23 NOTE — Patient Instructions (Signed)
Visit Information   PATIENT GOALS:   Goals Addressed             This Visit's Progress    Medication Management       Patient Goals/Self-Care Activities Over the next 180 days, patient will:  take medications as prescribed  Follow Up Plan: Telephone follow up appointment with care management team member scheduled for:  6 months         Consent to CCM Services: Mr. Baquero was given information about Chronic Care Management services today including:  CCM service includes personalized support from designated clinical staff supervised by his physician, including individualized plan of care and coordination with other care providers 24/7 contact phone numbers for assistance for urgent and routine care needs. Service will only be billed when office clinical staff spend 20 minutes or more in a month to coordinate care. Only one practitioner may furnish and bill the service in a calendar month. The patient may stop CCM services at any time (effective at the end of the month) by phone call to the office staff. The patient will be responsible for cost sharing (co-pay) of up to 20% of the service fee (after annual deductible is met).  Patient agreed to services and verbal consent obtained.   The patient verbalized understanding of instructions, educational materials, and care plan provided today and agreed to receive a mailed copy of patient instructions, educational materials, and care plan.   Telephone follow up appointment with care management team member scheduled for: 6 months [place]: Patient Care Plan: Medication Management     Problem Identified: DM, HTN, HLD, tobacco use      Long-Range Goal: Disease Progression Prevention   Start Date: 11/23/2020  This Visit's Progress: On track  Priority: High  Note:   Current Barriers:  None at present  Pharmacist Clinical Goal(s):  Over the next 180 days, patient will maintain control of chronic  conditions as evidenced by medication fill history, lab values, and vital signs  through collaboration with PharmD and provider.   Interventions: 1:1 collaboration with [name], MD regarding development and update of comprehensive plan of care as evidenced by provider attestation and co-signature Inter-disciplinary care team collaboration (see longitudinal plan of care) Comprehensive medication review performed; medication list updated in electronic medical record  Diabetes:  Uncontrolled; current treatment:synjardy 12.5-500mg, levemir 45 units at bedtime (patient recently self-adjusted and increased units);   Current glucose readings: 120s  Denies hypoglycemic/hyperglycemic symptoms  Current meal patterns: to be discussed at future visits  Current exercise: to be discussed at future visits  Recommended continue current regimen,  Hypertension:  Controlled; current treatment:lisinopril 6m on hold d/t hypotension;   Current home readings: to be discussed at future visits  Denies hypotensive/hypertensive symptoms  Recommended lisinopril 2.569mdaily for renal protection,  Hyperlipidemia:  Controlled; current treatment:atorvastatin 4021m   Recommended continue current regimen Tobacco Abuse:  1 packs per day; 61 years of use; does/does not smoke within 30 minutes of waking up  Previous quit attempts: none  Triggers to smoke: not discussed  Motivation to quit smoking: none, says will never quit  On a scale of 1-10, how IMPORTANT is it for you to quit smoking: 0  On a scale of 1-10, how CONFIDENT are you that you can quit smoking: 0  Counseled on goal of minimizing usage to LESS per day, since patient admits will never quit and has no desire to stop at this  time.  Patient Goals/Self-Care Activities Over the next 180 days, patient will:  take medications as prescribed  Follow Up Plan: Telephone follow up appointment with care management team member scheduled for:  6 months

## 2020-11-27 ENCOUNTER — Other Ambulatory Visit: Payer: Self-pay | Admitting: *Deleted

## 2020-11-27 DIAGNOSIS — R7989 Other specified abnormal findings of blood chemistry: Secondary | ICD-10-CM

## 2020-12-05 ENCOUNTER — Ambulatory Visit: Payer: Medicare Other | Admitting: Family Medicine

## 2020-12-07 ENCOUNTER — Ambulatory Visit (INDEPENDENT_AMBULATORY_CARE_PROVIDER_SITE_OTHER): Payer: Medicare Other | Admitting: Family Medicine

## 2020-12-07 ENCOUNTER — Other Ambulatory Visit: Payer: Self-pay

## 2020-12-07 ENCOUNTER — Encounter: Payer: Self-pay | Admitting: Family Medicine

## 2020-12-07 VITALS — BP 107/68 | HR 82 | Temp 98.6°F | Ht 73.0 in | Wt 210.0 lb

## 2020-12-07 DIAGNOSIS — M25512 Pain in left shoulder: Secondary | ICD-10-CM | POA: Diagnosis not present

## 2020-12-07 MED ORDER — MELOXICAM 15 MG PO TABS: 15.0000 mg | ORAL_TABLET | Freq: Every day | ORAL | 0 refills | Status: DC

## 2020-12-07 NOTE — Progress Notes (Signed)
Established Patient Office Visit  Subjective:  Patient ID: Jon [name], male    DOB: November 14, 1945  Age: 75 y.o. MRN: [id number]  CC:  Chief Complaint  Patient presents with   Shoulder Pain    HPI [name] presents for Left shoulder pain.  He reports that he woke up about a month ago with a little twinge in both shoulders which she describes as almost a soreness.  It was very brief at the time.  About 2 weeks later he started to some more persistent pain particularly in the left shoulder which was bothering him on and off all day.  In just a little bit in the right shoulder from time to time.  Over the last 6 days he took Aleve and says he would get temporary relief but not complete relief.  He has been able to continue to golf without any pain or difficulty.  He says when he lays directly on that shoulder he actually gets a little bit of relief but if he lays on the opposite shoulder so that his arm is moving across his chest he will get pain that almost throbs in that left shoulder.  Past Medical History:  Diagnosis Date   BENIGN POSITIONAL VERTIGO 05/20/2010   Qualifier: Diagnosis of  By: Madilyn Fireman MD, Rekha Hobbins     Diabetes mellitus    Epididymitis    Hyperlipemia    Hypertension    Stroke [facility]) 7/11    Past Surgical History:  Procedure Laterality Date   PILONIDAL CYST EXCISION  1960, 61, 62, 54   VASECTOMY      Family History  Problem Relation Age of Onset   Diabetes Other        family hx of   Colon cancer Neg Hx     Social History   Socioeconomic History   Marital status: Divorced    Spouse name: Not on file   Number of children: 2   Years of education: 12th grade   Highest education level: High school graduate  Occupational History   Occupation: Retired  Tobacco Use   Smoking status: Every Day    Packs/day: 1.00    Years: 60.00    Pack years: 60.00    Types: Cigarettes   Smokeless tobacco: Never   Tobacco comments:    Counseling sheet  given in exam room 07-2011   Substance and Sexual Activity   Alcohol use: No   Drug use: No   Sexual activity: Not on file  Other Topics Concern   Not on file  Social History Narrative   Lives alone and Likes to play golf 2 x 3 times a week.   Social Determinants of Health   Financial Resource Strain: Low Risk    Difficulty of Paying Living Expenses: Not hard at all  Food Insecurity: No Food Insecurity   Worried About Charity fundraiser in the Last Year: Never true   [place] in the Last Year: Never true  Transportation Needs: No Transportation Needs   Lack of Transportation (Medical): No   Lack of Transportation (Non-Medical): No  Physical Activity: Insufficiently Active   Days of Exercise per Week: 3 days   Minutes of Exercise per Session: 30 min  Stress: No Stress Concern Present   Feeling of Stress : Not at all  Social Connections: Moderately Isolated   Frequency of Communication with Friends and Family: More than three times a week   Frequency of Social Gatherings with  Friends and Family: More than three times a week   Attends Religious Services: Never   Marine scientist or Organizations: No   Attends Music therapist: Never   Marital Status: Married  Human resources officer Violence: Not At Risk   Fear of Current or Ex-Partner: No   Emotionally Abused: No   Physically Abused: No   Sexually Abused: No    Outpatient Medications Prior to Visit  Medication Sig Dispense Refill   atorvastatin (LIPITOR) 40 MG tablet Take 1 tablet (40 mg total) by mouth daily. Needs lab work. 90 tablet 0   Blood Glucose Monitoring Suppl (ACCU-CHEK AVIVA PLUS) w/Device KIT Check fasting blood sugar daily and before meals. DM Diagnosis 1 kit prn   clopidogrel (PLAVIX) 75 MG tablet TAKE 1 TABLET BY MOUTH  DAILY 90 tablet 3   fish oil-omega-3 fatty acids 1000 MG capsule Take 4 g by mouth daily. Takes it once in a while     glucose blood (ACCU-CHEK AVIVA PLUS) test strip USE 1  STRIP TO CHECK BLOOD  SUGAR TWICE DAILY AS  DIRECTED 200 strip 3   insulin detemir (LEVEMIR FLEXTOUCH) 100 UNIT/ML FlexPen Inject 50 Units into the skin daily. (Patient taking differently: Inject 50 Units into the skin daily. Takes 30 units once a day) 45 mL 3   Insulin Pen Needle (CAREFINE PEN NEEDLES) 31G X 8 MM MISC For use with injecting insulin daily 100 each 3   ketoconazole (NIZORAL) 2 % cream APPLY TO AFFECTED AREA(S)  TOPICALLY ONCE DAILY 45 g 3   lisinopril (ZESTRIL) 10 MG tablet TAKE 1 TABLET BY MOUTH  DAILY 90 tablet 3   SYNJARDY 12.5-500 MG TABS TAKE 1 TABLET BY MOUTH  TWICE DAILY (Patient taking differently: 529m) 180 tablet 3   traZODone (DESYREL) 50 MG tablet TAKE 1/2 TO 1 TABLET BY  MOUTH AT BEDTIME AS NEEDED  FOR SLEEP 90 tablet 3   No facility-administered medications prior to visit.    Allergies  Allergen Reactions   Gabapentin Other (See Comments)    Nightmares.    Lisinopril Other (See Comments)    Hypotension   Metformin And Related Diarrhea   Trazodone And Nefazodone Other (See Comments)    dizziness    ROS Review of Systems    Objective:    Physical Exam Vitals reviewed.  Constitutional:      Appearance: He is well-developed.  HENT:     Head: Normocephalic and atraumatic.  Eyes:     Conjunctiva/sclera: Conjunctivae normal.  Cardiovascular:     Rate and Rhythm: Normal rate.  Pulmonary:     Effort: Pulmonary effort is normal.  Musculoskeletal:     Comments: Left shoulder with normal range of motion.  Pain directly over the outer edge of the acromion.  Nontender over the bicep tendon.  Strength is 5 out of 5.  Some pain with external rotation but normal range of motion bilaterally.  Nontender over the [facility].  Skin:    General: Skin is dry.     Coloration: Skin is not pale.  Neurological:     Mental Status: He is alert and oriented to person, place, and time.  Psychiatric:        Behavior: Behavior normal.    BP 107/68 (BP Location: Left  Arm, Patient Position: Sitting, Cuff Size: Large)   Pulse 82   Temp 98.6 F (37 C) (Oral)   Ht _0  (1.854 m)   Wt 210 lb (95.3 kg)  SpO2 99% Comment: on RA  BMI 27.71 kg/m  Wt Readings from Last 3 Encounters:  12/07/20 210 lb (95.3 kg)  11/22/20 208 lb (94.3 kg)  06/04/20 217 lb (98.4 kg)     Health Maintenance Due  Topic Date Due   Zoster Vaccines- Shingrix (1 of 2) Never done   COLONOSCOPY (Pts 45-82yr Insurance coverage will need to be confirmed)  07/29/2016   COVID-19 Vaccine (4 - Booster for [place] [place]) 07/09/2020   OPHTHALMOLOGY EXAM  10/30/2020   FOOT EXAM  12/06/2020    There are no preventive care reminders to display for this patient.  Lab Results  Component Value Date   TSH 5.14 (H) 11/22/2020   Lab Results  Component Value Date   WBC 7.3 11/22/2020   HGB 15.4 11/22/2020   HCT 46.1 11/22/2020   MCV 97.9 11/22/2020   PLT 169 11/22/2020   Lab Results  Component Value Date   NA 142 11/22/2020   K 4.9 11/22/2020   CO2 24 11/22/2020   GLUCOSE 114 (H) 11/22/2020   BUN 17 11/22/2020   CREATININE 1.74 (H) 11/22/2020   BILITOT 0.5 11/22/2020   ALKPHOS 54 12/11/2019   AST 16 11/22/2020   ALT 22 11/22/2020   PROT 7.0 11/22/2020   ALBUMIN 4.5 12/11/2019   CALCIUM 9.6 11/22/2020   ANIONGAP 10 12/11/2019   EGFR 41 (L) 11/22/2020   Lab Results  Component Value Date   CHOL 145 11/22/2020   Lab Results  Component Value Date   HDL 42 11/22/2020   Lab Results  Component Value Date   LDLCALC 78 11/22/2020   Lab Results  Component Value Date   TRIG 157 (H) 11/22/2020   Lab Results  Component Value Date   CHOLHDL 3.5 11/22/2020   Lab Results  Component Value Date   HGBA1C 7.4 (A) 11/22/2020      Assessment & Plan:   Problem List Items Addressed This Visit   None Visit Diagnoses     Acute pain of left shoulder    -  Primary      Left shoulder pain-based on his description and normal exam suspect bursitis.  Though it is a little  unusual that putting direct pressure on the shoulder does not cause pain but I was able to press directly on the shoulder and trigger pain.  Will switch to meloxicam so he has a more 24-hour acting anti-inflammatory since he did get some relief with Aleve.  Showed him a handout of exercises online to do daily for the next 2 to 3 weeks to see if he is improving and if not then please let uKoreaknow at that point we would consider an x-ray and referral possibly to sports medicine.  Meds ordered this encounter  Medications   meloxicam (MOBIC) 15 MG tablet    Sig: Take 1 tablet (15 mg total) by mouth daily.    Dispense:  30 tablet    Refill:  0    Follow-up: Return if symptoms worsen or fail to improve.    CBeatrice Lecher MD

## 2020-12-28 ENCOUNTER — Other Ambulatory Visit: Payer: Self-pay

## 2020-12-28 MED ORDER — LISINOPRIL 5 MG PO TABS: ORAL_TABLET | ORAL | 3 refills | Status: DC

## 2021-01-01 ENCOUNTER — Other Ambulatory Visit: Payer: Self-pay | Admitting: Family Medicine

## 2021-01-01 DIAGNOSIS — E118 Type 2 diabetes mellitus with unspecified complications: Secondary | ICD-10-CM

## 2021-01-16 ENCOUNTER — Other Ambulatory Visit: Payer: Self-pay | Admitting: *Deleted

## 2021-01-16 MED ORDER — LISINOPRIL 2.5 MG PO TABS: 2.5000 mg | ORAL_TABLET | Freq: Every day | ORAL | 1 refills | Status: DC

## 2021-01-28 ENCOUNTER — Other Ambulatory Visit: Payer: Self-pay | Admitting: *Deleted

## 2021-01-28 MED ORDER — KETOCONAZOLE 2 % EX CREA
TOPICAL_CREAM | Freq: Every day | CUTANEOUS | 3 refills | Status: DC
Start: 2021-01-28 — End: 2022-01-20

## 2021-03-02 ENCOUNTER — Other Ambulatory Visit: Payer: Self-pay | Admitting: Family Medicine

## 2021-03-11 ENCOUNTER — Ambulatory Visit: Payer: Medicare Other | Admitting: Sports Medicine

## 2021-03-19 DIAGNOSIS — Z794 Long term (current) use of insulin: Secondary | ICD-10-CM | POA: Diagnosis not present

## 2021-03-19 DIAGNOSIS — H40053 Ocular hypertension, bilateral: Secondary | ICD-10-CM | POA: Diagnosis not present

## 2021-03-19 DIAGNOSIS — H2513 Age-related nuclear cataract, bilateral: Secondary | ICD-10-CM | POA: Diagnosis not present

## 2021-03-19 DIAGNOSIS — E119 Type 2 diabetes mellitus without complications: Secondary | ICD-10-CM | POA: Diagnosis not present

## 2021-03-19 LAB — HM DIABETES EYE EXAM

## 2021-03-20 ENCOUNTER — Ambulatory Visit: Payer: Medicare Other | Admitting: Podiatry

## 2021-03-20 ENCOUNTER — Encounter: Payer: Self-pay | Admitting: Family Medicine

## 2021-03-20 ENCOUNTER — Other Ambulatory Visit: Payer: Self-pay

## 2021-03-20 ENCOUNTER — Ambulatory Visit (INDEPENDENT_AMBULATORY_CARE_PROVIDER_SITE_OTHER): Payer: Medicare Other | Admitting: Family Medicine

## 2021-03-20 VITALS — BP 104/63 | HR 78 | Ht 73.0 in | Wt 210.0 lb

## 2021-03-20 DIAGNOSIS — J432 Centrilobular emphysema: Secondary | ICD-10-CM | POA: Diagnosis not present

## 2021-03-20 DIAGNOSIS — E118 Type 2 diabetes mellitus with unspecified complications: Secondary | ICD-10-CM | POA: Diagnosis not present

## 2021-03-20 DIAGNOSIS — E1122 Type 2 diabetes mellitus with diabetic chronic kidney disease: Secondary | ICD-10-CM | POA: Diagnosis not present

## 2021-03-20 DIAGNOSIS — N183 Chronic kidney disease, stage 3 unspecified: Secondary | ICD-10-CM

## 2021-03-20 DIAGNOSIS — Z23 Encounter for immunization: Secondary | ICD-10-CM | POA: Diagnosis not present

## 2021-03-20 DIAGNOSIS — R7989 Other specified abnormal findings of blood chemistry: Secondary | ICD-10-CM | POA: Diagnosis not present

## 2021-03-20 LAB — POCT GLYCOSYLATED HEMOGLOBIN (HGB A1C): Hemoglobin A1C: 7 % — AB (ref 4.0–5.6)

## 2021-03-20 NOTE — Progress Notes (Signed)
Established Patient Office Visit  Subjective:  Patient ID: Jon [name], male    DOB: [birth year]  Age: 75 y.o. MRN: [id number]  CC:  Chief Complaint  Patient presents with   Diabetes    HPI Jon [name] presents for   Diabetes - no hypoglycemic events. No wounds or sores that are not healing well. No increased thirst or urination. Checking glucose at home. Taking medications as prescribed without any side effects.  He is currently using 44 units of his Levemir and feels like that is been working well he has not had any affordability issues with his insurance and has not been having any low blood sugars.  He still gets out and plays golf 2 days/week.  Unfortunately his best friend of 50 years that he normally plays golf with passed away recently from T-cell lymphoma.  He does have a lump on the back of his wrist that he would like to take a look at today he is pretty sure it is a cyst he has not had any pain or discomfort.  He also wanted to let me know that occasionally he gets a sensation in his chest which she describes as a "toothache".  He says its not actually "painful".  It usually last about 1 second and is brief it can happen at any time at rest or activity.  When he feels it he says he will feel a little lightheaded or dizzy just for a second and then it goes away.  He did have a full cardiac work-up about 2 years ago with cardiology and was told that everything looked fantastic at that time.  Just had his [place] and was told he did not have any evidence of retinopathy from his diabetes.  He also wants to know when his last CBC was since his friend just passed away from lymphoma.   Past Medical History:  Diagnosis Date   BENIGN POSITIONAL VERTIGO 05/20/2010   Qualifier: Diagnosis of  By: Linford Arnold MD, Gershon Shorten     Diabetes mellitus    Epididymitis    Hyperlipemia    Hypertension    Stroke [facility]) 7/11    Past Surgical  History:  Procedure Laterality Date   PILONIDAL CYST EXCISION  1960, 61, 62, 44   VASECTOMY      Family History  Problem Relation Age of Onset   Diabetes Other        family hx of   Colon cancer Neg Hx     Social History   Socioeconomic History   Marital status: Divorced    Spouse name: Not on file   Number of children: 2   Years of education: 12th grade   Highest education level: High school graduate  Occupational History   Occupation: Retired  Tobacco Use   Smoking status: Every Day    Packs/day: 1.00    Years: 60.00    Pack years: 60.00    Types: Cigarettes   Smokeless tobacco: Never  Substance and Sexual Activity   Alcohol use: No   Drug use: No   Sexual activity: Not on file  Other Topics Concern   Not on file  Social History Narrative   Lives alone and Likes to play golf 2 x 3 times a week.   Social Determinants of Health   Financial Resource Strain: Low Risk    Difficulty of Paying Living Expenses: Not hard at all  Food Insecurity: No Food Insecurity  Worried About Charity fundraiser in the Last Year: Never true   [place] in the Last Year: Never true  Transportation Needs: No Transportation Needs   Lack of Transportation (Medical): No   Lack of Transportation (Non-Medical): No  Physical Activity: Insufficiently Active   Days of Exercise per Week: 3 days   Minutes of Exercise per Session: 30 min  Stress: No Stress Concern Present   Feeling of Stress : Not at all  Social Connections: Moderately Isolated   Frequency of Communication with Friends and Family: More than three times a week   Frequency of Social Gatherings with Friends and Family: More than three times a week   Attends Religious Services: Never   Marine scientist or Organizations: No   Attends Music therapist: Never   Marital Status: Married  Human resources officer Violence: Not At Risk   Fear of Current or Ex-Partner: No   Emotionally Abused: No   Physically  Abused: No   Sexually Abused: No    Outpatient Medications Prior to Visit  Medication Sig Dispense Refill   atorvastatin (LIPITOR) 40 MG tablet TAKE 1 TABLET BY MOUTH  DAILY 90 tablet 3   Blood Glucose Monitoring Suppl (ACCU-CHEK AVIVA PLUS) w/Device KIT Check fasting blood sugar daily and before meals. DM Diagnosis 1 kit prn   clopidogrel (PLAVIX) 75 MG tablet Take 1 tablet (75 mg total) by mouth daily. **PATIENT NEEDS OFFICE VISIT FOR ADDITIONAL REFILLS** 90 tablet 0   fish oil-omega-3 fatty acids 1000 MG capsule Take 4 g by mouth daily. Takes it once in a while     glucose blood (ACCU-CHEK AVIVA PLUS) test strip USE 1 STRIP TO CHECK BLOOD  SUGAR TWICE DAILY AS  DIRECTED 200 strip 3   insulin detemir (LEVEMIR FLEXTOUCH) 100 UNIT/ML FlexPen Inject 50 Units into the skin daily. **PATIENT NEEDS OFFICE VISIT FOR ADDITIONAL REFILLS** 45 mL 0   Insulin Pen Needle (CAREFINE PEN NEEDLES) 31G X 8 MM MISC For use with injecting insulin daily 100 each 3   ketoconazole (NIZORAL) 2 % cream Apply topically daily. 60 g 3   lisinopril (ZESTRIL) 2.5 MG tablet Take 1 tablet (2.5 mg total) by mouth daily. 90 tablet 1   meloxicam (MOBIC) 15 MG tablet Take 1 tablet (15 mg total) by mouth daily. 30 tablet 0   SYNJARDY 12.5-500 MG TABS TAKE 1 TABLET BY MOUTH  TWICE DAILY (Patient taking differently: 500mg ) 180 tablet 3   traZODone (DESYREL) 50 MG tablet TAKE 1/2 TO 1 TABLET BY  MOUTH AT BEDTIME AS NEEDED  FOR SLEEP 90 tablet 3   No facility-administered medications prior to visit.    Allergies  Allergen Reactions   Gabapentin Other (See Comments)    Nightmares.    Lisinopril Other (See Comments)    Hypotension   Metformin And Related Diarrhea   Trazodone And Nefazodone Other (See Comments)    dizziness    ROS Review of Systems    Objective:    Physical Exam Constitutional:      Appearance: Normal appearance. He is well-developed.  HENT:     Head: Normocephalic and atraumatic.  Cardiovascular:      Rate and Rhythm: Normal rate and regular rhythm.     Heart sounds: Normal heart sounds.  Pulmonary:     Effort: Pulmonary effort is normal.     Breath sounds: Normal breath sounds.  Skin:    General: Skin is warm and dry.  Neurological:  Mental Status: He is alert and oriented to person, place, and time. Mental status is at baseline.  Psychiatric:        Behavior: Behavior normal.    BP 104/63 (BP Location: Left Arm, Cuff Size: Normal)   Pulse 78   Ht $R'6\' 1"'sn$  (1.854 m)   Wt 210 lb (95.3 kg)   SpO2 100%   BMI 27.71 kg/m  Wt Readings from Last 3 Encounters:  03/20/21 210 lb (95.3 kg)  12/07/20 210 lb (95.3 kg)  11/22/20 208 lb (94.3 kg)     Health Maintenance Due  Topic Date Due   Zoster Vaccines- Shingrix (1 of 2) Never done   COLONOSCOPY (Pts 45-14yrs Insurance coverage will need to be confirmed)  07/29/2016   COVID-19 Vaccine (4 - Booster for [place] series) 06/05/2020   OPHTHALMOLOGY EXAM  10/30/2020    There are no preventive care reminders to display for this patient.  Lab Results  Component Value Date   TSH 5.14 (H) 11/22/2020   Lab Results  Component Value Date   WBC 7.3 11/22/2020   HGB 15.4 11/22/2020   HCT 46.1 11/22/2020   MCV 97.9 11/22/2020   PLT 169 11/22/2020   Lab Results  Component Value Date   NA 142 11/22/2020   K 4.9 11/22/2020   CO2 24 11/22/2020   GLUCOSE 114 (H) 11/22/2020   BUN 17 11/22/2020   CREATININE 1.74 (H) 11/22/2020   BILITOT 0.5 11/22/2020   ALKPHOS 54 12/11/2019   AST 16 11/22/2020   ALT 22 11/22/2020   PROT 7.0 11/22/2020   ALBUMIN 4.5 12/11/2019   CALCIUM 9.6 11/22/2020   ANIONGAP 10 12/11/2019   EGFR 41 (L) 11/22/2020   Lab Results  Component Value Date   CHOL 145 11/22/2020   Lab Results  Component Value Date   HDL 42 11/22/2020   Lab Results  Component Value Date   LDLCALC 78 11/22/2020   Lab Results  Component Value Date   TRIG 157 (H) 11/22/2020   Lab Results  Component Value Date    CHOLHDL 3.5 11/22/2020   Lab Results  Component Value Date   HGBA1C 7.0 (A) 03/20/2021      Assessment & Plan:   Problem List Items Addressed This Visit       Respiratory   Centrilobular emphysema ([place])    Continues to smoke and is not interested in quitting at this time.  No recent significant symptoms.        Endocrine   Controlled diabetes mellitus type 2 with complications ([place]) - Primary    Really happy with his A1c at 7.0 today we have been working towards this consistently he is not willing to make any other major dietary changes he is happy with what he is doing and I think that is perfectly reasonable and we will continue with his current regimen.  If he has any problems let me know he did ask if we could go 4 months since he does court drive [place] to come here and I think that would be perfectly fine he can always come in sooner if he has any specific current concerns.  Fully we will get his eye exam report in the next couple of days and we can get that abstracted.      Relevant Orders   POCT glycosylated hemoglobin (Hb A1C) (Completed)   CKD stage 3 due to type 2 diabetes mellitus (HCC)    F/U renal function Q 6 mo. It  was up last time so due to recheck today so that we can make sure that it goes back down to baseline if it otherwise looks good then we will plan to do his routine labs again in January or February..   Lab Results  Component Value Date   CREATININE 1.74 (H) 11/22/2020         Other Visit Diagnoses     Need for immunization against influenza       Relevant Orders   Flu Vaccine QUAD High Dose(Fluad) (Completed)       No orders of the defined types were placed in this encounter.   Follow-up: Return in about 4 months (around 07/18/2021) for Diabetes follow-up, Hypertension.    Beatrice Lecher, MD

## 2021-03-20 NOTE — Assessment & Plan Note (Signed)
Really happy with his A1c at 7.0 today we have been working towards this consistently he is not willing to make any other major dietary changes he is happy with what he is doing and I think that is perfectly reasonable and we will continue with his current regimen.  If he has any problems let me know he did ask if we could go 4 months since he does court drive quite a distance to come here and I think that would be perfectly fine he can always come in sooner if he has any specific current concerns.  Fully we will get his eye exam report in the next couple of days and we can get that abstracted.

## 2021-03-20 NOTE — Progress Notes (Signed)
He reports taking 44U of Levemir.   His home BS over the past 90 days have ran around 100-125

## 2021-03-20 NOTE — Assessment & Plan Note (Addendum)
F/U renal function Q 6 mo. It was up last time so due to recheck today so that we can make sure that it goes back down to baseline if it otherwise looks good then we will plan to do his routine labs again in January or February..   Lab Results  Component Value Date   CREATININE 1.74 (H) 11/22/2020

## 2021-03-20 NOTE — Assessment & Plan Note (Signed)
Continues to smoke and is not interested in quitting at this time.  No recent significant symptoms.

## 2021-03-21 LAB — BASIC METABOLIC PANEL WITH GFR
BUN/Creatinine Ratio: 14 (calc) (ref 6–22)
BUN: 20 mg/dL (ref 7–25)
CO2: 20 mmol/L (ref 20–32)
Calcium: 9.5 mg/dL (ref 8.6–10.3)
Chloride: 108 mmol/L (ref 98–110)
Creat: 1.38 mg/dL — ABNORMAL HIGH (ref 0.70–1.28)
Glucose, Bld: 206 mg/dL — ABNORMAL HIGH (ref 65–99)
Potassium: 5.4 mmol/L — ABNORMAL HIGH (ref 3.5–5.3)
Sodium: 142 mmol/L (ref 135–146)
eGFR: 54 mL/min/{1.73_m2} — ABNORMAL LOW (ref >=60)

## 2021-03-21 NOTE — Progress Notes (Signed)
Hi  Jon [name] news!  Your kidney function is back to your baseline which is around 1.3.  So much better than 3 months ago which is very reassuring.  The only thing is that your potassium was up just a little bit just make sure you are hydrating well because you also look a little bit dry on your lab work.  Then we will plan to recheck blood work in 6 months

## 2021-03-22 ENCOUNTER — Encounter: Payer: Self-pay | Admitting: Family Medicine

## 2021-03-26 ENCOUNTER — Other Ambulatory Visit: Payer: Self-pay | Admitting: Family Medicine

## 2021-03-26 DIAGNOSIS — E118 Type 2 diabetes mellitus with unspecified complications: Secondary | ICD-10-CM

## 2021-04-11 ENCOUNTER — Other Ambulatory Visit: Payer: Self-pay | Admitting: Family Medicine

## 2021-05-02 ENCOUNTER — Encounter: Payer: Self-pay | Admitting: Family Medicine

## 2021-05-02 ENCOUNTER — Other Ambulatory Visit: Payer: Self-pay

## 2021-05-02 ENCOUNTER — Ambulatory Visit (INDEPENDENT_AMBULATORY_CARE_PROVIDER_SITE_OTHER): Payer: Medicare Other | Admitting: Family Medicine

## 2021-05-02 VITALS — BP 110/61 | HR 74 | Temp 97.8°F | Ht 73.0 in | Wt 216.0 lb

## 2021-05-02 DIAGNOSIS — R051 Acute cough: Secondary | ICD-10-CM | POA: Diagnosis not present

## 2021-05-02 DIAGNOSIS — J441 Chronic obstructive pulmonary disease with (acute) exacerbation: Secondary | ICD-10-CM | POA: Diagnosis not present

## 2021-05-02 LAB — POCT INFLUENZA A/B
Influenza A, POC: NEGATIVE
Influenza B, POC: NEGATIVE

## 2021-05-02 MED ORDER — PREDNISONE 20 MG PO TABS
40.0000 mg | ORAL_TABLET | Freq: Every day | ORAL | 0 refills | Status: DC
Start: 2021-05-02 — End: 2021-10-15

## 2021-05-02 MED ORDER — AZITHROMYCIN 250 MG PO TABS: ORAL_TABLET | ORAL | 0 refills | Status: AC

## 2021-05-02 NOTE — Progress Notes (Signed)
Acute Office Visit  Subjective:    Patient ID: Jon [name], male    DOB: 1945/10/24, 75 y.o.   MRN: [id number]  Chief Complaint  Patient presents with   Cough    HPI Patient is in today for Cough.  He says it started about 6 days ago woke up with a mildly scratchy sore throat.  The next day started getting a runny wet nose.  And then the next day started developed a deep deep cough in his chest.  He says it sounds like "marbles rolling around in a tin can".  He has been noticing some dark-colored sputum.  No fevers.  The sore throat has completely resolved.  No ear pain.  No GI symptoms.  He does have a history of COPD and is a smoker.  He says every 10 years or so he will get a very similar set of symptoms that usually last no more than 8 days tops. + mild nasal congestion.   Past Medical History:  Diagnosis Date   BENIGN POSITIONAL VERTIGO 05/20/2010   Qualifier: Diagnosis of  By: Madilyn Fireman MD,      Diabetes mellitus    Epididymitis    Hyperlipemia    Hypertension    Stroke [facility]) 7/11    Past Surgical History:  Procedure Laterality Date   PILONIDAL CYST EXCISION  1960, 61, 62, 88   VASECTOMY      Family History  Problem Relation Age of Onset   Diabetes Other        family hx of   Colon cancer Neg Hx     Social History   Socioeconomic History   Marital status: Divorced    Spouse name: Not on file   Number of children: 2   Years of education: 12th grade   Highest education level: High school graduate  Occupational History   Occupation: Retired  Tobacco Use   Smoking status: Every Day    Packs/day: 1.00    Years: 60.00    Pack years: 60.00    Types: Cigarettes   Smokeless tobacco: Never  Substance and Sexual Activity   Alcohol use: No   Drug use: No   Sexual activity: Not on file  Other Topics Concern   Not on file  Social History Narrative   Lives alone and Likes to play golf 2 x 3 times a week.   Social Determinants of Health    Financial Resource Strain: Low Risk    Difficulty of Paying Living Expenses: Not hard at all  Food Insecurity: No Food Insecurity   Worried About Charity fundraiser in the Last Year: Never true   [place] in the Last Year: Never true  Transportation Needs: No Transportation Needs   Lack of Transportation (Medical): No   Lack of Transportation (Non-Medical): No  Physical Activity: Insufficiently Active   Days of Exercise per Week: 3 days   Minutes of Exercise per Session: 30 min  Stress: No Stress Concern Present   Feeling of Stress : Not at all  Social Connections: Moderately Isolated   Frequency of Communication with Friends and Family: More than three times a week   Frequency of Social Gatherings with Friends and Family: More than three times a week   Attends Religious Services: Never   Marine scientist or Organizations: No   Attends Archivist Meetings: Never   Marital Status: Married  Human resources officer Violence: Not At Risk   Fear  of Current or Ex-Partner: No   Emotionally Abused: No   Physically Abused: No   Sexually Abused: No    Outpatient Medications Prior to Visit  Medication Sig Dispense Refill   atorvastatin (LIPITOR) 40 MG tablet TAKE 1 TABLET BY MOUTH  DAILY 90 tablet 3   Blood Glucose Monitoring Suppl (ACCU-CHEK AVIVA PLUS) w/Device KIT Check fasting blood sugar daily and before meals. DM Diagnosis 1 kit prn   clopidogrel (PLAVIX) 75 MG tablet TAKE 1 TABLET BY MOUTH  DAILY 90 tablet 3   fish oil-omega-3 fatty acids 1000 MG capsule Take 4 g by mouth daily. Takes it once in a while     glucose blood (ACCU-CHEK AVIVA PLUS) test strip USE 1 STRIP TO CHECK BLOOD  SUGAR TWICE DAILY AS  DIRECTED 200 strip 3   Insulin Pen Needle (CAREFINE PEN NEEDLES) 31G X 8 MM MISC For use with injecting insulin daily 100 each 3   ketoconazole (NIZORAL) 2 % cream Apply topically daily. 60 g 3   LEVEMIR FLEXTOUCH 100 UNIT/ML FlexTouch Pen INJECT SUBCUTANEOUSLY 50   UNITS DAILY 45 mL 1   lisinopril (ZESTRIL) 2.5 MG tablet Take 1 tablet (2.5 mg total) by mouth daily. 90 tablet 1   meloxicam (MOBIC) 15 MG tablet Take 1 tablet (15 mg total) by mouth daily. 30 tablet 0   SYNJARDY 12.5-500 MG TABS TAKE 1 TABLET BY MOUTH  TWICE DAILY (Patient taking differently: 549m) 180 tablet 3   traZODone (DESYREL) 50 MG tablet TAKE 1/2 TO 1 TABLET BY  MOUTH AT BEDTIME AS NEEDED  FOR SLEEP 90 tablet 3   No facility-administered medications prior to visit.    Allergies  Allergen Reactions   Gabapentin Other (See Comments)    Nightmares.    Lisinopril Other (See Comments)    Hypotension   Metformin And Related Diarrhea   Trazodone And Nefazodone Other (See Comments)    dizziness    Review of Systems     Objective:    Physical Exam Constitutional:      Appearance: He is well-developed.  HENT:     Head: Normocephalic and atraumatic.     Right Ear: External ear normal.     Left Ear: External ear normal.     Nose: Nose normal.  Eyes:     Conjunctiva/sclera: Conjunctivae normal.     Pupils: Pupils are equal, round, and reactive to light.  Neck:     Thyroid: No thyromegaly.  Cardiovascular:     Rate and Rhythm: Normal rate.     Heart sounds: Normal heart sounds.  Pulmonary:     Effort: Pulmonary effort is normal.     Breath sounds: Wheezing and rhonchi present.     Comments: Diffuse wheezing and rhonchi. Musculoskeletal:     Cervical back: Neck supple.  Lymphadenopathy:     Cervical: No cervical adenopathy.  Skin:    General: Skin is warm and dry.  Neurological:     Mental Status: He is alert and oriented to person, place, and time.    BP 110/61    Pulse 74    Temp 97.8 F (36.6 C)    Ht 6' 1" (1.854 m)    Wt 216 lb (98 kg)    SpO2 99%    BMI 28.50 kg/m  Wt Readings from Last 3 Encounters:  05/02/21 216 lb (98 kg)  03/20/21 210 lb (95.3 kg)  12/07/20 210 lb (95.3 kg)    Health Maintenance Due  Topic Date Due  Zoster Vaccines- Shingrix (1  of 2) Never done   COLONOSCOPY (Pts 45-64yr Insurance coverage will need to be confirmed)  07/29/2016   COVID-19 Vaccine (4 - Booster for Pfizer series) 06/05/2020    There are no preventive care reminders to display for this patient.   Lab Results  Component Value Date   TSH 5.14 (H) 11/22/2020   Lab Results  Component Value Date   WBC 7.3 11/22/2020   HGB 15.4 11/22/2020   HCT 46.1 11/22/2020   MCV 97.9 11/22/2020   PLT 169 11/22/2020   Lab Results  Component Value Date   NA 142 03/20/2021   K 5.4 (H) 03/20/2021   CO2 20 03/20/2021   GLUCOSE 206 (H) 03/20/2021   BUN 20 03/20/2021   CREATININE 1.38 (H) 03/20/2021   BILITOT 0.5 11/22/2020   ALKPHOS 54 12/11/2019   AST 16 11/22/2020   ALT 22 11/22/2020   PROT 7.0 11/22/2020   ALBUMIN 4.5 12/11/2019   CALCIUM 9.5 03/20/2021   ANIONGAP 10 12/11/2019   EGFR 54 (L) 03/20/2021   Lab Results  Component Value Date   CHOL 145 11/22/2020   Lab Results  Component Value Date   HDL 42 11/22/2020   Lab Results  Component Value Date   LDLCALC 78 11/22/2020   Lab Results  Component Value Date   TRIG 157 (H) 11/22/2020   Lab Results  Component Value Date   CHOLHDL 3.5 11/22/2020   Lab Results  Component Value Date   HGBA1C 7.0 (A) 03/20/2021       Assessment & Plan:   Problem List Items Addressed This Visit   None Visit Diagnoses     Acute cough    -  Primary   Relevant Medications   azithromycin (ZITHROMAX) 250 MG tablet   predniSONE (DELTASONE) 20 MG tablet   Other Relevant Orders   POCT Influenza A/B (Completed)   Novel Coronavirus, NAA ([facility])   COPD exacerbation (HCC)       Relevant Medications   azithromycin (ZITHROMAX) 250 MG tablet   predniSONE (DELTASONE) 20 MG tablet      COPD exacerbation - flu neg. Did send COVID PCR.  He feels some better today.  Wheezing on ronchi on exam today.  Will tx with zpack and prednisone.  Call if not better in one week.    Meds ordered this encounter   Medications   azithromycin (ZITHROMAX) 250 MG tablet    Sig: 2 Ttabs PO on Day 1, then one a day x 4 days.    Dispense:  6 tablet    Refill:  0   predniSONE (DELTASONE) 20 MG tablet    Sig: Take 2 tablets (40 mg total) by mouth daily with breakfast.    Dispense:  10 tablet    Refill:  0     CBeatrice Lecher MD

## 2021-05-03 LAB — NOVEL CORONAVIRUS, NAA: SARS-CoV-2, NAA: NOT DETECTED

## 2021-05-03 LAB — SARS-COV-2, NAA 2 DAY TAT

## 2021-05-03 NOTE — Progress Notes (Signed)
Negative for COVID

## 2021-05-15 ENCOUNTER — Other Ambulatory Visit: Payer: Self-pay | Admitting: Family Medicine

## 2021-05-15 NOTE — Telephone Encounter (Signed)
[facility] m/o is requesting med refill for lisinopril. Rx is listed in patient's allergy chart (hypotension/intolerance). Rx is still active on med list. Pls advise, thanks.

## 2021-06-27 ENCOUNTER — Ambulatory Visit: Payer: Medicare Other | Admitting: Podiatry

## 2021-07-19 ENCOUNTER — Other Ambulatory Visit: Payer: Self-pay | Admitting: Family Medicine

## 2021-07-24 ENCOUNTER — Ambulatory Visit: Payer: Medicare Other | Admitting: Family Medicine

## 2021-07-26 ENCOUNTER — Ambulatory Visit (INDEPENDENT_AMBULATORY_CARE_PROVIDER_SITE_OTHER): Payer: Medicare Other | Admitting: Family Medicine

## 2021-07-26 DIAGNOSIS — Z0000 Encounter for general adult medical examination without abnormal findings: Secondary | ICD-10-CM | POA: Diagnosis not present

## 2021-07-26 DIAGNOSIS — Z122 Encounter for screening for malignant neoplasm of respiratory organs: Secondary | ICD-10-CM

## 2021-07-26 DIAGNOSIS — F17200 Nicotine dependence, unspecified, uncomplicated: Secondary | ICD-10-CM | POA: Diagnosis not present

## 2021-07-26 NOTE — Progress Notes (Signed)
? ? ?MEDICARE ANNUAL WELLNESS VISIT ? ?07/26/2021 ? ?Telephone Visit Disclaimer ?This Medicare AWV was conducted by telephone due to national recommendations for restrictions regarding the COVID-19 Pandemic (e.g. social distancing).  I verified, using two identifiers, that I am speaking with Jon [name] or their authorized healthcare agent. I discussed the limitations, risks, security, and privacy concerns of performing an evaluation and management service by telephone and the potential availability of an in-person appointment in the future. The patient expressed understanding and agreed to proceed.  ?Location of Patient: Home ?Location of Provider (nurse):  Provider home ? ?Subjective:  ? ? ?Jon [name] is a 76 y.o. male patient of Metheney, Rene Kocher, MD who had a Medicare Annual Wellness Visit today via telephone. Jon [name] is Retired and lives alone. he has 2 children. he reports that he is socially active and does interact with friends/family regularly. he is minimally physically active and enjoys playing golf. ? ?Patient Care Team: ?[name], MD as PCP - General (Family Medicine) ?Darius Bump, [facility] as Pharmacist (Pharmacist) ?Talbert Nan, OD as Referring Physician (Optometry) ? ?Advanced Directives 07/26/2021 07/25/2020 12/11/2019 09/26/2013 09/06/2013  ?Does Patient Have a Medical Advance Directive? Yes Yes No Patient would not like information;Patient does not have advance directive Patient does not have advance directive  ?Type of Advance Directive Living will Living will - - -  ?Does patient want to make changes to medical advance directive? No - Patient declined No - Patient declined - - -  ?Would patient like information on creating a medical advance directive? - - No - Patient declined - -  ? ? ?Hospital Utilization Over the Past 12 Months: ?# of hospitalizations or ER visits: 0 ?# of surgeries: 0 ? ?Review of Systems    ?Patient reports that his overall health is unchanged  compared to last year. ? ?History obtained from chart review and the patient ? ?Patient Reported Readings (BP, Pulse, CBG, Weight, etc) ?none ? ?Pain Assessment ?Pain : No/denies pain ? ?  ? ?Current Medications & Allergies (verified) ?Allergies as of 07/26/2021   ? ?   Reactions  ? Gabapentin Other (See Comments)  ? Nightmares.   ? Lisinopril Other (See Comments)  ? Hypotension  ? Metformin And Related Diarrhea  ? ?  ? ?  ?Medication List  ?  ? ?  ? Accurate as of July 26, 2021 11:05 AM. If you have any questions, ask your nurse or doctor.  ?  ?  ? ?  ? ?Accu-Chek Aviva Plus test strip ?Generic drug: glucose blood ?USE 1 STRIP TO CHECK BLOOD  SUGAR TWICE DAILY AS  DIRECTED ?  ?Accu-Chek Aviva Plus w/Device Kit ?Check fasting blood sugar daily and before meals. DM Diagnosis ?  ?atorvastatin 40 MG tablet ?Commonly known as: LIPITOR ?TAKE 1 TABLET BY MOUTH  DAILY ?  ?CareFine Pen Needles 31G X 8 MM Misc ?Generic drug: Insulin Pen Needle ?For use with injecting insulin daily ?  ?clopidogrel 75 MG tablet ?Commonly known as: PLAVIX ?TAKE 1 TABLET BY MOUTH  DAILY ?  ?fish oil-omega-3 fatty acids 1000 MG capsule ?Take 4 g by mouth daily. Takes it once in a while ?  ?ketoconazole 2 % cream ?Commonly known as: NIZORAL ?Apply topically daily. ?  ?Levemir FlexTouch 100 UNIT/ML FlexPen ?Generic drug: insulin detemir ?INJECT SUBCUTANEOUSLY 50  UNITS DAILY ?  ?lisinopril 2.5 MG tablet ?Commonly known as: ZESTRIL ?TAKE 1 TABLET BY MOUTH  DAILY ?  ?meloxicam 15 MG  tablet ?Commonly known as: MOBIC ?Take 1 tablet (15 mg total) by mouth daily. ?  ?predniSONE 20 MG tablet ?Commonly known as: DELTASONE ?Take 2 tablets (40 mg total) by mouth daily with breakfast. ?  ?Synjardy 12.5-500 MG Tabs ?Generic drug: Empagliflozin-metFORMIN HCl ?TAKE 1 TABLET BY MOUTH  TWICE DAILY ?What changed:  ?how much to take ?how to take this ?when to take this ?additional instructions ?  ?traZODone 50 MG tablet ?Commonly known as: DESYREL ?TAKE 1/2 TO 1  TABLET BY  MOUTH AT BEDTIME AS NEEDED  FOR SLEEP ?  ? ?  ? ? ?History (reviewed): ?Past Medical History:  ?Diagnosis Date  ? BENIGN POSITIONAL VERTIGO 05/20/2010  ? Qualifier: Diagnosis of  By: Madilyn Fireman MD, Barnetta Chapel    ? Diabetes mellitus   ? Epididymitis   ? Hyperlipemia   ? Hypertension   ? Stroke [facility]) 7/11  ? ?Past Surgical History:  ?Procedure Laterality Date  ? PILONIDAL CYST EXCISION  1960, 61, 62, 64  ? VASECTOMY    ? ?Family History  ?Problem Relation Age of Onset  ? Diabetes Other   ?     family hx of  ? Colon cancer Neg Hx   ? ?Social History  ? ?Socioeconomic History  ? Marital status: Divorced  ?  Spouse name: Not on file  ? Number of children: 2  ? Years of education: 12th grade  ? Highest education level: High school graduate  ?Occupational History  ? Occupation: Retired  ?Tobacco Use  ? Smoking status: Every Day  ?  Packs/day: 1.00  ?  Years: 62.00  ?  Pack years: 62.00  ?  Types: Cigarettes  ? Smokeless tobacco: Never  ?Substance and Sexual Activity  ? Alcohol use: No  ? Drug use: No  ? Sexual activity: Not on file  ?Other Topics Concern  ? Not on file  ?Social History Narrative  ? Lives alone and Likes to play golf 2 x 3 times a week.  ? ?Social Determinants of Health  ? ?Financial Resource Strain: Low Risk   ? Difficulty of Paying Living Expenses: Not hard at all  ?Food Insecurity: No Food Insecurity  ? Worried About Charity fundraiser in the Last Year: Never true  ? Ran Out of Food in the Last Year: Never true  ?Transportation Needs: No Transportation Needs  ? Lack of Transportation (Medical): No  ? Lack of Transportation (Non-Medical): No  ?Physical Activity: Insufficiently Active  ? Days of Exercise per Week: 2 days  ? Minutes of Exercise per Session: 20 min  ?Stress: No Stress Concern Present  ? Feeling of Stress : Not at all  ?Social Connections: Socially Isolated  ? Frequency of Communication with Friends and Family: More than three times a week  ? Frequency of Social Gatherings with Friends  and Family: Once a week  ? Attends Religious Services: Never  ? Active Member of Clubs or Organizations: No  ? Attends Archivist Meetings: Never  ? Marital Status: Divorced  ? ? ?Activities of Daily Living ?In your present state of health, do you have any difficulty performing the following activities: 07/26/2021  ?Hearing? N  ?Vision? N  ?Difficulty concentrating or making decisions? N  ?Walking or climbing stairs? N  ?Dressing or bathing? N  ?Doing errands, shopping? N  ?Preparing Food and eating ? N  ?Using the Toilet? N  ?In the past six months, have you accidently leaked urine? N  ?Do you have problems with loss  of bowel control? N  ?Managing your Medications? N  ?Managing your Finances? N  ?Housekeeping or managing your Housekeeping? N  ?Some recent data might be hidden  ? ? ?Patient Education/ Literacy ?How often do you need to have someone help you when you read instructions, pamphlets, or other written materials from your doctor or pharmacy?: 1 - Never ?What is the last grade level you completed in school?: 12th grade ? ?Exercise ?Current Exercise Habits: Structured exercise class, Type of exercise: strength training/weights, Time (Minutes): 15, Frequency (Times/Week): 2, Weekly Exercise (Minutes/Week): 30, Intensity: Moderate, Exercise limited by: None identified ? ?Diet ?Patient reports consuming 2 meals a day and 3 snack(s) a day ?Patient reports that his primary diet is: Regular ?Patient reports that she does have regular access to food.  ? ?Depression Screen ?PHQ 2/9 Scores 07/26/2021 07/25/2020 12/07/2019 09/07/2019 07/15/2018 05/18/2018 02/09/2018  ?PHQ - 2 Score 0 0 0 0 0 0 0  ?PHQ- 9 Score - 0 0 - 0 - -  ?  ? ?Fall Risk ?Fall Risk  07/26/2021 07/25/2020 12/07/2019 09/07/2019 07/15/2018  ?Falls in the past year? 0 0 0 0 0  ?Number falls in past yr: 0 0 0 - -  ?Injury with Fall? 0 0 0 0 -  ?Risk for fall due to : No Fall Risks No Fall Risks - No Fall Risks -  ?Follow up Falls evaluation completed Falls  evaluation completed Falls evaluation completed - Falls evaluation completed  ? ?  ?Objective:  ?Jon [name] seemed alert and oriented and he participated appropriately during our telephone visit

## 2021-07-26 NOTE — Patient Instructions (Addendum)
?MEDICARE ANNUAL WELLNESS VISIT ?Health Maintenance Summary and Written Plan of Care ? ?[name] , ? ?Thank you for allowing me to perform your Medicare Annual Wellness Visit and for your ongoing commitment to your health.  ? ?Health Maintenance & Immunization History ?Health Maintenance  ?Topic Date Due  ? COVID-19 Vaccine (4 - Booster for [place] series) 08/11/2021 (Originally 06/05/2020)  ? Zoster Vaccines- Shingrix (1 of 2) 10/26/2021 (Originally 05/19/1964)  ? COLONOSCOPY (Pts 45-44yr Insurance coverage will need to be confirmed)  07/27/2022 (Originally 07/29/2016)  ? HEMOGLOBIN A1C  09/17/2021  ? OPHTHALMOLOGY EXAM  03/19/2022  ? FOOT EXAM  03/20/2022  ? TETANUS/TDAP  05/21/2028  ? Pneumonia Vaccine 75 Years old  Completed  ? INFLUENZA VACCINE  Completed  ? Hepatitis C Screening  Completed  ? HPV VACCINES  Aged Out  ? ?Immunization History  ?Administered Date(s) Administered  ? Fluad Quad(high Dose 65+) 02/09/2019, 03/07/2020, 03/20/2021  ? Influenza Split 03/06/2011, 03/05/2012  ? Influenza Whole 04/14/2009  ? Influenza, High Dose Seasonal PF 03/26/2016, 01/27/2017, 02/09/2018  ? Influenza,inj,Quad PF,6+ Mos 03/09/2013, 12/27/2013, 03/21/2015  ? PFIZER(Purple Top)SARS-COV-2 Vaccination 09/02/2019, 09/27/2019, 04/10/2020  ? Pneumococcal Conjugate-13 09/26/2014  ? Pneumococcal Polysaccharide-23 03/15/2009, 12/03/2012  ? Tdap 05/21/2018  ? ? ?These are the patient goals that we discussed: ? Goals Addressed   ? ?  ?  ?  ?  ?  ? This Visit's Progress  ?   Patient Stated (pt-stated)     ?   07/26/2021 ?AWV Goal: Diabetes Management ? ?Patient will maintain an A1C level below 7.0 ?Patient will not develop any diabetic foot complications ?Patient will not experience any hypoglycemic episodes over the next 3 months ?Patient will notify our office of any CBG readings outside of the provider recommended range by calling [phone]?Patient will adhere to provider recommendations for diabetes management ? ?Patient Self  Management Activities ?take all medications as prescribed and report any negative side effects ?monitor and record blood sugar readings as directed ?adhere to a low carbohydrate diet that incorporates lean proteins, vegetables, whole grains, low glycemic fruits ?check feet daily noting any sores, cracks, injuries, or callous formations ?see PCP or podiatrist if he notices any changes in his legs, feet, or toenails ?Patient will visit PCP and have an A1C level checked every 3 to 6 months as directed  ?have a yearly eye exam to monitor for vascular changes associated with diabetes and will request that the report be sent to his pcp.  ?consult with his PCP regarding any changes in his health or new or worsening symptoms  ?  ? ?  ?  ? ?This is a list of Health Maintenance Items that are overdue or due now: ?Colorectal cancer screening ?Shingrix ? ?Orders/Referrals Placed Today: ?Orders Placed This Encounter  ?Procedures  ? Ambulatory Referral Lung Cancer Screening [facility]  ?  Referral Priority:   Routine  ?  Referral Type:   Consultation  ?  Referral Reason:   Specialty Services Required  ?  Number of Visits Requested:   1  ? ? ?(Contact our referral department at [phone] you have not spoken with someone about your referral appointment within the next 5 days)  ? ? ?Follow-up Plan ?Follow-up with MHali Marry MD as planned ?Schedule your Shingrix vaccine at your pharmacy.  ?Please let [facility] if you would like for [facility] send the colorectal cancer screening referral.  ?Medicare wellness visit in one year.  ?AVS printed and mailed to  the patient. ? ? ? ?  ?Health Maintenance, Male ?Adopting a healthy lifestyle and getting preventive care are important in promoting health and wellness. Ask your health care provider about: ?The right schedule for you to have regular tests and exams. ?Things you can do on your own to prevent diseases and keep yourself healthy. ?What should I know about diet,  weight, and exercise? ?Eat a healthy diet ? ?Eat a diet that includes plenty of vegetables, fruits, low-fat dairy products, and lean protein. ?Do not eat a lot of foods that are high in solid fats, added sugars, or sodium. ?Maintain a healthy weight ?Body mass index (BMI) is a measurement that can be used to identify possible weight problems. It estimates body fat based on height and weight. Your health care provider can help determine your BMI and help you achieve or maintain a healthy weight. ?Get regular exercise ?Get regular exercise. This is one of the most important things you can do for your health. Most adults should: ?Exercise for at least 150 minutes each week. The exercise should increase your heart rate and make you sweat (moderate-intensity exercise). ?Do strengthening exercises at least twice a week. This is in addition to the moderate-intensity exercise. ?Spend less time sitting. Even light physical activity can be beneficial. ?Watch cholesterol and blood lipids ?Have your blood tested for lipids and cholesterol at 75 years of age, then have this test every 5 years. ?You may need to have your cholesterol levels checked more often if: ?Your lipid or cholesterol levels are high. ?You are older than 75 years of age. ?You are at high risk for heart disease. ?What should I know about cancer screening? ?Many types of cancers can be detected early and may often be prevented. Depending on your health history and family history, you may need to have cancer screening at various ages. This may include screening for: ?Colorectal cancer. ?Prostate cancer. ?Skin cancer. ?Lung cancer. ?What should I know about heart disease, diabetes, and high blood pressure? ?Blood pressure and heart disease ?High blood pressure causes heart disease and increases the risk of stroke. This is more likely to develop in people who have high blood pressure readings or are overweight. ?Talk with your health care provider about your  target blood pressure readings. ?Have your blood pressure checked: ?Every 3-5 years if you are 6-65 years of age. ?Every year if you are 78 years old or older. ?If you are between the ages of 7 and 10 and are a current or former smoker, ask your health care provider if you should have a one-time screening for abdominal aortic aneurysm (AAA). ?Diabetes ?Have regular diabetes screenings. This checks your fasting blood sugar level. Have the screening done: ?Once every three years after age 1 if you are at a normal weight and have a low risk for diabetes. ?More often and at a younger age if you are overweight or have a high risk for diabetes. ?What should I know about preventing infection? ?Hepatitis B ?If you have a higher risk for hepatitis B, you should be screened for this virus. Talk with your health care provider to find out if you are at risk for hepatitis B infection. ?Hepatitis C ?Blood testing is recommended for: ?Everyone born from [birth year] through 1964. ?Anyone with known risk factors for hepatitis C. ?Sexually transmitted infections (STIs) ?You should be screened each year for STIs, including gonorrhea and chlamydia, if: ?You are sexually active and are younger than 75 years of age. ?You are  older than 75 years of age and your health care provider tells you that you are at risk for this type of infection. ?Your sexual activity has changed since you were last screened, and you are at increased risk for chlamydia or gonorrhea. Ask your health care provider if you are at risk. ?Ask your health care provider about whether you are at high risk for HIV. Your health care provider may recommend a prescription medicine to help prevent HIV infection. If you choose to take medicine to prevent HIV, you should first get tested for HIV. You should then be tested every 3 months for as long as you are taking the medicine. ?Follow these instructions at home: ?Alcohol use ?Do not drink alcohol if your health care provider  tells you not to drink. ?If you drink alcohol: ?Limit how much you have to 0-2 drinks a day. ?Know how much alcohol is in your drink. In the [place] one drink equals one 12 oz bottle of beer (355 mL), one 5 oz glass o

## 2021-09-01 ENCOUNTER — Other Ambulatory Visit: Payer: Self-pay | Admitting: Family Medicine

## 2021-09-09 ENCOUNTER — Telehealth: Payer: Self-pay

## 2021-09-09 NOTE — Telephone Encounter (Signed)
Left voicemail to call to schedule annual LDCT ?

## 2021-09-11 ENCOUNTER — Other Ambulatory Visit: Payer: Self-pay | Admitting: Family Medicine

## 2021-09-20 ENCOUNTER — Other Ambulatory Visit: Payer: Self-pay | Admitting: Family Medicine

## 2021-09-24 ENCOUNTER — Ambulatory Visit: Payer: Medicare Other | Admitting: Family Medicine

## 2021-10-08 ENCOUNTER — Telehealth: Payer: Self-pay

## 2021-10-08 DIAGNOSIS — E118 Type 2 diabetes mellitus with unspecified complications: Secondary | ICD-10-CM

## 2021-10-08 MED ORDER — ACCU-CHEK AVIVA PLUS VI STRP: ORAL_STRIP | 99 refills | Status: DC

## 2021-10-08 MED ORDER — ACCU-CHEK AVIVA PLUS W/DEVICE KIT: PACK | 99 refills | Status: DC

## 2021-10-08 NOTE — Telephone Encounter (Signed)
Needs new meter. Glucometer sent to pharmacy.

## 2021-10-09 ENCOUNTER — Other Ambulatory Visit: Payer: Self-pay

## 2021-10-09 DIAGNOSIS — E118 Type 2 diabetes mellitus with unspecified complications: Secondary | ICD-10-CM

## 2021-10-09 MED ORDER — ACCU-CHEK SOFTCLIX LANCETS MISC: 3 refills | Status: DC

## 2021-10-11 MED ORDER — ACCU-CHEK GUIDE VI STRP: ORAL_STRIP | 99 refills | Status: DC

## 2021-10-11 NOTE — Addendum Note (Signed)
Addended by: Narda Rutherford on: 10/11/2021 01:38 PM   Modules accepted: Orders

## 2021-10-11 NOTE — Addendum Note (Signed)
Addended by: Narda Rutherford on: 10/11/2021 01:32 PM   Modules accepted: Orders

## 2021-10-15 ENCOUNTER — Encounter: Payer: Self-pay | Admitting: Family Medicine

## 2021-10-15 ENCOUNTER — Ambulatory Visit (INDEPENDENT_AMBULATORY_CARE_PROVIDER_SITE_OTHER): Payer: Medicare Other

## 2021-10-15 ENCOUNTER — Ambulatory Visit (INDEPENDENT_AMBULATORY_CARE_PROVIDER_SITE_OTHER): Payer: Medicare Other | Admitting: Family Medicine

## 2021-10-15 VITALS — BP 130/68 | HR 76 | Resp 18 | Ht 73.0 in | Wt 203.0 lb

## 2021-10-15 DIAGNOSIS — E118 Type 2 diabetes mellitus with unspecified complications: Secondary | ICD-10-CM

## 2021-10-15 DIAGNOSIS — J441 Chronic obstructive pulmonary disease with (acute) exacerbation: Secondary | ICD-10-CM

## 2021-10-15 DIAGNOSIS — M19011 Primary osteoarthritis, right shoulder: Secondary | ICD-10-CM | POA: Diagnosis not present

## 2021-10-15 DIAGNOSIS — M19012 Primary osteoarthritis, left shoulder: Secondary | ICD-10-CM | POA: Diagnosis not present

## 2021-10-15 DIAGNOSIS — I69341 Monoplegia of lower limb following cerebral infarction affecting right dominant side: Secondary | ICD-10-CM | POA: Diagnosis not present

## 2021-10-15 DIAGNOSIS — J432 Centrilobular emphysema: Secondary | ICD-10-CM | POA: Diagnosis not present

## 2021-10-15 DIAGNOSIS — M419 Scoliosis, unspecified: Secondary | ICD-10-CM | POA: Diagnosis not present

## 2021-10-15 DIAGNOSIS — R051 Acute cough: Secondary | ICD-10-CM

## 2021-10-15 DIAGNOSIS — R059 Cough, unspecified: Secondary | ICD-10-CM | POA: Diagnosis not present

## 2021-10-15 LAB — POCT GLYCOSYLATED HEMOGLOBIN (HGB A1C): Hemoglobin A1C: 6.8 % — AB (ref 4.0–5.6)

## 2021-10-15 MED ORDER — ACCU-CHEK GUIDE ME W/DEVICE KIT: PACK | 3 refills | Status: DC

## 2021-10-15 NOTE — Assessment & Plan Note (Signed)
Notices just all occasional gait abnormality because of that right lower leg.  But now having episode of bilateral lower extremity weakness.

## 2021-10-15 NOTE — Assessment & Plan Note (Signed)
He has increased cough and sputum production over the last 3 months so I do think he is in a flare but I am concerned about the 13 pound weight loss.  Some to start with a plain film chest x-ray just to rule out underlying mass etc.will tx with abx and prednisone

## 2021-10-15 NOTE — Assessment & Plan Note (Signed)
A1c looks great today at 6.8 he is doing well in fact he is actually been able to reduce his insulin from 50 units down to 30 units.  Has not had any hypoglycemic events.  I am concerned about the lower extremity weakness that he experienced about 4 days ago.  Not sure if it may have been heat or dehydration related.  Did encourage him to check his blood sugars if he has an opportunity if it happens again.

## 2021-10-15 NOTE — Progress Notes (Signed)
 Established Patient Office Visit  Subjective   Patient ID: Jon [name], male    DOB: 01/21/1946  Age: 76 y.o. MRN: [id number]  Chief Complaint  Patient presents with   Cough    3 weeks   Diabetes    Follow up     HPI  Cough x 3 weeks.  Has lost aobut 13 lbs. last lung cancer CT screening was 4 years ago.  He says he has not been intentionally trying to lose weight but has been try to be more conscious eater in regards to his blood sugars and so thinks he is definitely eating a little less and less sugary foods.  He denies any sore throat fever or sinus symptoms.  He feels like the cough is constant through the day and at night but usually worse at night.  Sputum is clear.  Tried using some Robitussin but it was not helpful.  He has noticed a little bit more breathlessness with activity but its been mild.  Diabetes - no hypoglycemic events. No wounds or sores that are not healing well. No increased thirst or urination. Checking glucose at home. Taking medications as prescribed without any side effects. Down to 30 units.    Also wanted to let me know about an episode that occurred on Saturday [place] 4 [place].  After couple of hours of being on the golf course he suddenly felt like his legs were extremely weak and most like they would not support his body.  He has had this happen a couple times before.  It did get to between 85 and 90 degrees on Saturday.  He then went golfing the next day on Sunday and it was much cooler and never really got above 65 degrees and said he never had any problems with his legs are feeling weak.  He says on Saturday when he felt the weakness he did go home and check his blood pressure and says it looked great.  He had eaten breakfast that morning and he had also been drinking caffeine free and diet Mountain Dew.  He says more recently he has been trying to go to the gym and do some weights on his legs just because he feels like his legs are  more weak.  He does occasionally experience a little bit of weakness in his right neck leg which is residual from an old stroke.     ROS    Objective:     BP 130/68 (BP Location: Right Arm)   Pulse 76   Resp 18   Ht 6' 1" (1.854 m)   Wt 203 lb (92.1 kg)   SpO2 98%   BMI 26.78 kg/m     Physical Exam Constitutional:      Appearance: He is well-developed.  HENT:     Head: Normocephalic and atraumatic.     Right Ear: Ear canal and external ear normal.     Left Ear: Ear canal and external ear normal.     Ears:     Comments: Both TMs blocked by cerumen.    Nose: Nose normal.  Eyes:     Conjunctiva/sclera: Conjunctivae normal.     Pupils: Pupils are equal, round, and reactive to light.  Neck:     Thyroid: No thyromegaly.  Cardiovascular:     Rate and Rhythm: Normal rate.     Heart sounds: Normal heart sounds.  Pulmonary:     Effort: Pulmonary effort is normal.       Breath sounds: Normal breath sounds.  Musculoskeletal:     Cervical back: Neck supple.  Lymphadenopathy:     Cervical: No cervical adenopathy.  Skin:    General: Skin is warm and dry.  Neurological:     Mental Status: He is alert and oriented to person, place, and time.     Results for orders placed or performed in visit on 10/15/21  POCT HgB A1C  Result Value Ref Range   Hemoglobin A1C 6.8 (A) 4.0 - 5.6 %   HbA1c POC (<> result, manual entry)     HbA1c, POC (prediabetic range)     HbA1c, POC (controlled diabetic range)      Last hemoglobin A1c Lab Results  Component Value Date   HGBA1C 6.8 (A) 10/15/2021      The 10-year ASCVD risk score (Arnett DK, et al., 2019) is: 49.7%    Assessment & Plan:   Problem List Items Addressed This Visit       Respiratory   Centrilobular emphysema (HCC)    He has increased cough and sputum production over the last 3 months so I do think he is in a flare but I am concerned about the 13 pound weight loss.  Some to start with a plain film chest x-ray just  to rule out underlying mass etc.will tx with abx and prednisone         Endocrine   Controlled diabetes mellitus type 2 with complications (HCC) - Primary    A1c looks great today at 6.8 he is doing well in fact he is actually been able to reduce his insulin from 50 units down to 30 units.  Has not had any hypoglycemic events.  I am concerned about the lower extremity weakness that he experienced about 4 days ago.  Not sure if it may have been heat or dehydration related.  Did encourage him to check his blood sugars if he has an opportunity if it happens again.       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  But now having episode of bilateral lower extremity weakness.       Other Visit Diagnoses     Acute cough       Relevant Medications   Blood Glucose Monitoring Suppl (ACCU-CHEK GUIDE ME) w/Device KIT   Other Relevant Orders   DG Chest 2 View   COPD exacerbation (HCC)       Relevant Medications   Blood Glucose Monitoring Suppl (ACCU-CHEK GUIDE ME) w/Device KIT   Other Relevant Orders   DG Chest 2 View       No follow-ups on file.   I spent 40 minutes on the day of the encounter to include pre-visit record review, face-to-face time with the patient and post visit ordering of test.    , MD  

## 2021-10-16 MED ORDER — PREDNISONE 20 MG PO TABS: 40.0000 mg | ORAL_TABLET | Freq: Every day | ORAL | 0 refills | Status: DC

## 2021-10-16 MED ORDER — AZITHROMYCIN 250 MG PO TABS: ORAL_TABLET | ORAL | 0 refills | Status: AC

## 2021-10-16 NOTE — Progress Notes (Signed)
Hi Jon [name], chest x-ray looks good which is reassuring.  Still would recommend that she consider getting the yearly CT scan for lung cancer screening.  If you are interested in that we can get you scheduled.  Antibiotic and prednisone have been sent to his pharmacy.

## 2021-10-26 ENCOUNTER — Other Ambulatory Visit: Payer: Self-pay | Admitting: Family Medicine

## 2021-11-05 ENCOUNTER — Telehealth: Payer: Self-pay

## 2021-11-05 ENCOUNTER — Other Ambulatory Visit: Payer: Self-pay | Admitting: Family Medicine

## 2021-11-05 DIAGNOSIS — I771 Stricture of artery: Secondary | ICD-10-CM

## 2021-11-05 DIAGNOSIS — E118 Type 2 diabetes mellitus with unspecified complications: Secondary | ICD-10-CM

## 2021-11-05 DIAGNOSIS — R42 Dizziness and giddiness: Secondary | ICD-10-CM

## 2021-11-06 NOTE — Telephone Encounter (Signed)
My recommendation would be for him to get back in with his vascular specialist.  I think its been about 2 years since he was seen he probably needs some repeat evaluation and imaging to see if some of the narrowing and atherosclerosis is actually worsening.  Dr. [name], Belenda Cruise, vascular

## 2021-11-06 NOTE — Telephone Encounter (Signed)
LVM advising patient of Dr. Gardiner Ramus recommendations. Provided vasular surgeon's name and location. Callback information provided.

## 2021-11-07 NOTE — Addendum Note (Signed)
Addended by: Narda Rutherford on: 11/07/2021 03:28 PM   Modules accepted: Orders

## 2021-11-08 NOTE — Addendum Note (Signed)
Addended by: Beatrice Lecher D on: 11/08/2021 08:42 AM   Modules accepted: Orders

## 2021-11-08 NOTE — Telephone Encounter (Signed)
Orders Placed This Encounter  Procedures   Ambulatory referral to Vascular Surgery    Referral Priority:   Routine    Referral Type:   Surgical    Referral Reason:   Specialty Services Required    Referred to Provider:   Katha Cabal, MD    Requested Specialty:   Vascular Surgery    Number of Visits Requested:   1

## 2021-11-09 HISTORY — PX: OTHER SURGICAL HISTORY: SHX169

## 2021-11-11 ENCOUNTER — Other Ambulatory Visit: Payer: Self-pay

## 2021-11-11 ENCOUNTER — Emergency Department
Admission: EM | Admit: 2021-11-11 | Discharge: 2021-11-12 | Disposition: A | Discharge status: Home / Self Care | Payer: Medicare Other | Attending: Emergency Medicine | Admitting: Emergency Medicine

## 2021-11-11 ENCOUNTER — Emergency Department: Payer: Medicare Other

## 2021-11-11 DIAGNOSIS — R7989 Other specified abnormal findings of blood chemistry: Secondary | ICD-10-CM | POA: Diagnosis not present

## 2021-11-11 DIAGNOSIS — G459 Transient cerebral ischemic attack, unspecified: Secondary | ICD-10-CM | POA: Diagnosis not present

## 2021-11-11 DIAGNOSIS — I739 Peripheral vascular disease, unspecified: Secondary | ICD-10-CM | POA: Diagnosis not present

## 2021-11-11 DIAGNOSIS — R531 Weakness: Secondary | ICD-10-CM | POA: Diagnosis not present

## 2021-11-11 DIAGNOSIS — R4781 Slurred speech: Secondary | ICD-10-CM | POA: Diagnosis not present

## 2021-11-11 DIAGNOSIS — I69351 Hemiplegia and hemiparesis following cerebral infarction affecting right dominant side: Secondary | ICD-10-CM | POA: Insufficient documentation

## 2021-11-11 DIAGNOSIS — F17290 Nicotine dependence, other tobacco product, uncomplicated: Secondary | ICD-10-CM | POA: Insufficient documentation

## 2021-11-11 DIAGNOSIS — I619 Nontraumatic intracerebral hemorrhage, unspecified: Secondary | ICD-10-CM | POA: Diagnosis not present

## 2021-11-11 DIAGNOSIS — R0600 Dyspnea, unspecified: Secondary | ICD-10-CM | POA: Diagnosis not present

## 2021-11-11 DIAGNOSIS — Z7902 Long term (current) use of antithrombotics/antiplatelets: Secondary | ICD-10-CM | POA: Diagnosis not present

## 2021-11-11 DIAGNOSIS — Z743 Need for continuous supervision: Secondary | ICD-10-CM | POA: Diagnosis not present

## 2021-11-11 DIAGNOSIS — R0609 Other forms of dyspnea: Secondary | ICD-10-CM

## 2021-11-11 DIAGNOSIS — R739 Hyperglycemia, unspecified: Secondary | ICD-10-CM | POA: Diagnosis not present

## 2021-11-11 DIAGNOSIS — R0602 Shortness of breath: Secondary | ICD-10-CM | POA: Diagnosis not present

## 2021-11-11 DIAGNOSIS — R42 Dizziness and giddiness: Secondary | ICD-10-CM | POA: Diagnosis not present

## 2021-11-11 DIAGNOSIS — I10 Essential (primary) hypertension: Secondary | ICD-10-CM | POA: Diagnosis not present

## 2021-11-11 DIAGNOSIS — Z8673 Personal history of transient ischemic attack (TIA), and cerebral infarction without residual deficits: Secondary | ICD-10-CM | POA: Diagnosis not present

## 2021-11-11 DIAGNOSIS — H579 Unspecified disorder of eye and adnexa: Secondary | ICD-10-CM | POA: Diagnosis not present

## 2021-11-11 HISTORY — DX: Transient cerebral ischemic attack, unspecified: G45.9

## 2021-11-11 MED ORDER — MIDAZOLAM HCL 2 MG/2ML IJ SOLN
1.0000 mg | Freq: Once | INTRAMUSCULAR | Status: AC
  Administered 2021-11-11: 1 mg via INTRAVENOUS
  Filled 2021-11-11: qty 2

## 2021-11-11 NOTE — ED Notes (Signed)
Introduced self to pt.  Pt taken to Ct at this time.

## 2021-11-11 NOTE — ED Provider Notes (Signed)
Emergency Medicine Provider Triage Evaluation Note  Jon [name] , a 76 y.o. male  was evaluated in triage.  Pt complains of slurred speech, 2-3 sentences or 10-21 sec per pateint. Starting at 9:48.  Hx CVA 15 years ago.   Review of Systems  Positive: Slurred speech, dizziness x2-3 weeks Negative:  HA, blurred vision, focal numbness/weakness  Physical Exam  There were no vitals taken for this visit. Gen:   Awake, no distress   Resp:  Normal effort  MSK:   Moves extremities without difficulty  Other:    Medical Decision Making  Medically screening exam initiated at 11:19 PM.  Appropriate orders placed.  Jon [name] was informed that the remainder of the evaluation will be completed by another provider, this initial triage assessment does not replace that evaluation, and the importance of remaining in the ED until their evaluation is complete.    Rada Hay, MD 11/11/21 2325

## 2021-11-11 NOTE — Telephone Encounter (Signed)
Patient advised.

## 2021-11-11 NOTE — ED Triage Notes (Signed)
Pt presents to ER via [facility] from home d/t stroke like symptoms.  Per ems, pt was on phone with wife and had sudden onset of slurred speech that started at 2148 tonight.  Pt has hx of stroke appx 15 years ago and takes plavix at home.  Pt does not currently have any slurred speech.  NIH 0 at this time.  Pt is A&O x4 in NAD in triage.    139/75, cbg 226, 20 gauge LAC.  Pt given 200 cc NS en route.

## 2021-11-12 ENCOUNTER — Emergency Department: Payer: Medicare Other

## 2021-11-12 DIAGNOSIS — Z8673 Personal history of transient ischemic attack (TIA), and cerebral infarction without residual deficits: Secondary | ICD-10-CM | POA: Diagnosis not present

## 2021-11-12 DIAGNOSIS — I619 Nontraumatic intracerebral hemorrhage, unspecified: Secondary | ICD-10-CM | POA: Diagnosis not present

## 2021-11-12 DIAGNOSIS — I739 Peripheral vascular disease, unspecified: Secondary | ICD-10-CM | POA: Diagnosis not present

## 2021-11-12 LAB — CBC WITH DIFFERENTIAL/PLATELET
Abs Immature Granulocytes: 0.01 10*3/uL (ref 0.00–0.07)
Basophils Absolute: 0 10*3/uL (ref 0.0–0.1)
Basophils Relative: 1 %
Eosinophils Absolute: 0.2 10*3/uL (ref 0.0–0.5)
Eosinophils Relative: 3 %
HCT: 44.3 % (ref 39.0–52.0)
Hemoglobin: 14.8 g/dL (ref 13.0–17.0)
Immature Granulocytes: 0 %
Lymphocytes Relative: 29 %
Lymphs Abs: 1.5 10*3/uL (ref 0.7–4.0)
MCH: 32 pg (ref 26.0–34.0)
MCHC: 33.4 g/dL (ref 30.0–36.0)
MCV: 95.7 fL (ref 80.0–100.0)
Monocytes Absolute: 0.5 10*3/uL (ref 0.1–1.0)
Monocytes Relative: 9 %
Neutro Abs: 2.9 10*3/uL (ref 1.7–7.7)
Neutrophils Relative %: 58 %
Platelets: 206 10*3/uL (ref 150–400)
RBC: 4.63 MIL/uL (ref 4.22–5.81)
RDW: 12.1 % (ref 11.5–15.5)
WBC: 5 10*3/uL (ref 4.0–10.5)
nRBC: 0 % (ref 0.0–0.2)

## 2021-11-12 LAB — URINALYSIS, ROUTINE W REFLEX MICROSCOPIC
Bacteria, UA: NONE SEEN
Bilirubin Urine: NEGATIVE
Glucose, UA: >=500 mg/dL — AB
Hgb urine dipstick: NEGATIVE
Ketones, ur: NEGATIVE mg/dL
Leukocytes,Ua: NEGATIVE
Nitrite: NEGATIVE
Protein, ur: NEGATIVE mg/dL
Specific Gravity, Urine: 1.024 (ref 1.005–1.030)
Squamous Epithelial / HPF: NONE SEEN (ref 0–5)
WBC, UA: NONE SEEN WBC/hpf (ref 0–5)
pH: 5 (ref 5.0–8.0)

## 2021-11-12 LAB — COMPREHENSIVE METABOLIC PANEL
ALT: 18 U/L (ref 0–44)
AST: 16 U/L (ref 15–41)
Albumin: 3.7 g/dL (ref 3.5–5.0)
Alkaline Phosphatase: 95 U/L (ref 38–126)
Anion gap: 7 (ref 5–15)
BUN: 22 mg/dL (ref 8–23)
CO2: 24 mmol/L (ref 22–32)
Calcium: 8.8 mg/dL — ABNORMAL LOW (ref 8.9–10.3)
Chloride: 104 mmol/L (ref 98–111)
Creatinine, Ser: 1.66 mg/dL — ABNORMAL HIGH (ref 0.61–1.24)
GFR, Estimated: 43 mL/min — ABNORMAL LOW (ref >60)
Glucose, Bld: 261 mg/dL — ABNORMAL HIGH (ref 70–99)
Potassium: 4.5 mmol/L (ref 3.5–5.1)
Sodium: 135 mmol/L (ref 135–145)
Total Bilirubin: 0.2 mg/dL — ABNORMAL LOW (ref 0.3–1.2)
Total Protein: 7.2 g/dL (ref 6.5–8.1)

## 2021-11-12 LAB — URINE DRUG SCREEN, QUALITATIVE (ARMC ONLY)
Amphetamines, Ur Screen: NOT DETECTED
Barbiturates, Ur Screen: NOT DETECTED
Benzodiazepine, Ur Scrn: POSITIVE — AB
Cannabinoid 50 Ng, Ur ~~LOC~~: NOT DETECTED
Cocaine Metabolite,Ur ~~LOC~~: NOT DETECTED
MDMA (Ecstasy)Ur Screen: NOT DETECTED
Methadone Scn, Ur: NOT DETECTED
Opiate, Ur Screen: NOT DETECTED
Phencyclidine (PCP) Ur S: NOT DETECTED
Tricyclic, Ur Screen: NOT DETECTED

## 2021-11-12 LAB — ETHANOL: Alcohol, Ethyl (B): <10 mg/dL (ref <10)

## 2021-11-12 MED ORDER — MIDAZOLAM HCL 2 MG/2ML IJ SOLN
2.0000 mg | Freq: Once | INTRAMUSCULAR | Status: AC | PRN
  Administered 2021-11-12: 2 mg via INTRAVENOUS
  Filled 2021-11-12: qty 2

## 2021-11-12 NOTE — ED Notes (Signed)
Pt taken to MRI at this time

## 2021-11-12 NOTE — ED Notes (Signed)
Pt brought back from MRI at this time.  

## 2021-11-12 NOTE — ED Provider Notes (Signed)
[facility] Provider Note    Event Date/Time   First MD Initiated Contact with Patient 11/11/21 2335     (approximate)   History   No chief complaint on file.   HPI  Jon [name] is a 76 y.o. male whose prior medical history includes but is not limited to previous CVA with minor residual right-sided arm and leg weakness, diabetes, peripheral vascular disease, and some exertional dyspnea.  He presents tonight by EMS for strokelike symptoms, specifically slurred speech.  He reports that he was on the phone with his wife just before 10:00 PM when he started having slurred speech.  He was scared because of his prior stroke.  He states that he waited about 30 minutes and then called EMS.  However, his symptoms completely resolved by the time he got to the emergency department.  He said that he feels fine now and has no symptoms and feels his usual self.  He has had no new weakness in his arms or his legs, he is ambulatory, not dizzy, no headache, no numbness nor tingling that is new, no new word finding difficulties.  The patient reports that for the last few weeks or perhaps months, he has been having episodes of exertional dyspnea, where "about half of the time" when he gets up and goes to the bathroom at night, for example, he gets short of breath and feels like he has bilateral leg weakness.  He has spoken with his primary care doctor about it and they have an outpatient plan in terms of working it up.  He states he has never had heart problems of which she is aware and does not have a cardiologist.  He also does not have a neurologist and states that his primary care doctor is the one that manages his prior CVA.  He has been taking Plavix for the last 15 years since his previous stroke.  He reports that he still smokes tobacco but does not use drugs or drink alcohol.     Physical Exam   Triage Vital Signs: ED Triage Vitals  Enc Vitals Group     BP 11/11/21  2322 125/82     Pulse Rate 11/11/21 2322 81     Resp 11/11/21 2322 16     Temp 11/11/21 2322 98.7 F (37.1 C)     Temp Source 11/11/21 2322 Oral     SpO2 11/11/21 2322 99 %     Weight --      Height --      Head Circumference --      Peak Flow --      Pain Score 11/11/21 2324 0     Pain Loc --      Pain Edu? --      Excl. in [place]? --     Most recent vital signs: Vitals:   11/12/21 0207 11/12/21 0239  BP: 115/73 122/82  Pulse: 64 75  Resp: 18 14  Temp:    SpO2: 97% 100%     General: Awake, no distress.  CV:  Good peripheral perfusion.  Normal heart sounds. Resp:  Normal effort.  Lungs are clear to auscultation bilaterally. Abd:  No distention.  No tenderness to palpation. Neuro:  Good grip strength in major muscle groups strength in bilateral upper and lower extremities; I cannot appreciate a difference between the right side and left side in spite of his report that he has some chronic weakness after his prior CVA.  He had a little bit of word finding difficulty just during the course of our conversation, and he could not tell me for sure if that is a new thing.  However it was only briefly, specifically he was saying "spurred" instead of "slurred" speech.  However that resolved on its own.  He is alert and oriented to person, place, date, and situation.  NIH stroke scale was performed and his score is 0.   ED Results / Procedures / Treatments   Labs (all labs ordered are listed, but only abnormal results are displayed) Labs Reviewed  COMPREHENSIVE METABOLIC PANEL - Abnormal; Notable for the following components:      Result Value   Glucose, Bld 261 (*)    Creatinine, Ser 1.66 (*)    Calcium 8.8 (*)    Total Bilirubin 0.2 (*)    GFR, Estimated 43 (*)    All other components within normal limits  URINALYSIS, ROUTINE W REFLEX MICROSCOPIC - Abnormal; Notable for the following components:   Color, Urine YELLOW (*)    APPearance CLEAR (*)    Glucose, UA >=500 (*)    All  other components within normal limits  URINE DRUG SCREEN, QUALITATIVE ([facility] ONLY) - Abnormal; Notable for the following components:   Benzodiazepine, Ur Scrn POSITIVE (*)    All other components within normal limits  CBC WITH DIFFERENTIAL/PLATELET  ETHANOL     EKG  ED ECG REPORT I, Hinda Kehr, the attending physician, personally viewed and interpreted this ECG.  Date: 11/11/2021 EKG Time: 23:35 Rate: 76 Rhythm: normal sinus rhythm QRS Axis: normal Intervals: normal ST/T Wave abnormalities: Non-specific ST segment / T-wave changes, but no clear evidence of acute ischemia. Narrative Interpretation: no definitive evidence of acute ischemia; does not meet STEMI criteria.    RADIOLOGY I viewed and interpreted the patient's CT head, and I see no evidence of acute CVA nor acute intracranial bleeding.  Radiology report agrees that there are no acute findings.  I viewed and interpreted the patient's brain MRI.  I see no abnormality suggestive of acute infarction.  The radiology report agrees that there are no acute CVA, just an old CVA and chronic ischemic changes.    PROCEDURES:  Critical Care performed: Yes, see critical care procedure note(s)  .1-3 Lead EKG Interpretation  Performed by: Hinda Kehr, MD Authorized by: Hinda Kehr, MD     Interpretation: normal     ECG rate:  75   ECG rate assessment: normal     Rhythm: sinus rhythm     Ectopy: none     Conduction: normal   .Critical Care  Performed by: Hinda Kehr, MD Authorized by: Hinda Kehr, MD   Critical care provider statement:    Critical care time (minutes):  30   Critical care time was exclusive of:  Separately billable procedures and treating other patients   Critical care was necessary to treat or prevent imminent or life-threatening deterioration of the following conditions:  CNS failure or compromise   Critical care was time spent personally by me on the following activities:  Development of  treatment plan with patient or surrogate, evaluation of patient's response to treatment, examination of patient, obtaining history from patient or surrogate, ordering and performing treatments and interventions, ordering and review of laboratory studies, ordering and review of radiographic studies, pulse oximetry, re-evaluation of patient's condition and review of old charts    MEDICATIONS ORDERED IN ED: Medications  midazolam (VERSED) injection 1 mg (1 mg Intravenous Given 11/11/21  2340)  midazolam (VERSED) injection 2 mg (2 mg Intravenous Given 11/12/21 0104)     IMPRESSION / MDM / ASSESSMENT AND PLAN / ED COURSE  I reviewed the triage vital signs and the nursing notes.                              Differential diagnosis includes, but is not limited to, CVA versus TIA, acute intracranial bleed, electrolyte or metabolic abnormality, acute infectious process leading to acute encephalopathy, ACS.  Patient's presentation is most consistent with acute presentation with potential threat to life or bodily function.  Given that the patient's symptoms completely resolved by the time he arrived in the ED, he is not a candidate for tPA, and he has an NIH stroke scale of 0.  He got a CT head immediately after triage and as documented above, there were no acute findings.  Stroke order set utilized, ordered CMP, CBC with differential, ethanol level, stroke swallow screen, urine drug screen, urinalysis.  I spent an extended amount of time speaking with him, and he has claustrophobia and substantial reservations about MRI.  He was extremely anxious and resistant to the CT scan, and I ordered Versed 1 mg IV prior to the scan.  He seems completely at baseline in spite of the Versed but was able to tolerate the scan.  CBC is normal.  Ethanol is negative.  Comprehensive metabolic panel is essentially normal; he has a slightly elevated creatinine but it is within the realm of his values in the past and likely  represents chronic kidney disease rather than acute renal failure or AKI.  I gave the patient the option of admission for full TIA/stroke work-up, MR brain for further restratification, or discharge with the understanding that I cannot tell him for sure if he had a TIA or stroke.  He is willing to try getting the MRI tonight.  I ordered Versed 2 mg IV to be administered immediately prior to his MRI.  This will also give [place] an additional period of observation.  I think TIA is the most likely diagnosis, but he also has the capacity to make his own decision and may choose to be discharged for outpatient follow-up, which is certainly his right to do so.  I explained to him that it is always possible he may go home and have a much larger and potentially dangerous/deadly stroke with long-term consequences, but he has been living with that possibility since his original stroke 15 years ago and he is inclined to go home.  We will reassess after the MRI, however.    Clinical Course as of 11/12/21 0357  Tue Nov 12, 2021  0046 No evidence of ischemia on EKG. [CF]  [id number] Urinalysis negative, urine drug screen notable for benzodiazepines and that is after I gave him Versed. [CF]  2831 MR BRAIN WO CONTRAST MRI reassuring with no evidence of acute CVA [CF]  0350 I talked over the results with the patient extensively.  He has already taken off his cardiac leads and is ready to go.  I spent a fair amount of time with him explaining that I do think he had a TIA and that I could admit him for that, as well as the episodic exertional dyspnea he has been having, because I think he needs a cardiology work-up.  I explained he is at risk of having additional TIAs or even CVAs.  He says he understands, but  he wants to go home and follow-up as an outpatient.  I told him that I could order a cardiology referral and he said that would be fine.  He will also follow-up with primary care and may follow-up with vascular as he was  previously recommended.  He is adamant and I think this is a very reasonable plan given that he is already on antiplatelet therapy and his symptoms have completely resolved and he has had no recurrence over the last 4.5+ hours.  He is ambulatory without difficulty and eager to go.  I gave strict return precautions. [CF]    Clinical Course User Index [CF] Hinda Kehr, MD      FINAL CLINICAL IMPRESSION(S) / ED DIAGNOSES   Final diagnoses:  TIA (transient ischemic attack)  Dyspnea on exertion     Rx / DC Orders   ED Discharge Orders          Ordered    Ambulatory referral to Cardiology       Comments: If you have not heard from the Cardiology office within the next 72 hours please call [phone].   11/12/21 0354             Note:  This document was prepared using Dragon voice recognition software and may include unintentional dictation errors.   Hinda Kehr, MD 11/12/21 [phone]

## 2021-11-12 NOTE — Discharge Instructions (Signed)
As we discussed, your evaluation tonight in the emergency department was reassuring.  You had labs that were essentially normal or unchanged from before, an EKG that shows no sign of a heart attack, and a CT scan of your head as well as an MRI of your brain that shows that you did not have a recent stroke.  However, you could have had a mini stroke (transient ischemic attack, or "TIA") which is when you have strokelike symptoms that resolved and do not show up on MRI.  This increases your risk of having additional similar episodes or even a "real" stroke.  We talked about bringing you into the hospital for further evaluation by neurology, as well as for a cardiology consult for the shortness of breath with exertion you have been experiencing, but your preference is to go home and follow-up as an outpatient.  That is your decision, but please understand you should follow-up as recommended and return immediately to the emergency department if you develop new or worsening symptoms.

## 2021-11-13 NOTE — Progress Notes (Addendum)
Primary Care Provider: [name], MD [facility] Cardiologist: None Electrophysiologist: None  Clinic Note: Chief Complaint  Patient presents with   Dyspnea on Exertion   Chest Pain   Transient Ischemic Attack   New Patient (Initial Visit)    Follow up from the hospital, TIA, patient states this arm went numb and weakness. Patient states that he has been dizzy and lightheaded. Patient experiences dizziness. Meds reviewed with patient.    ===================================  ASSESSMENT/PLAN   Problem List Items Addressed This Visit       Cardiology Problems   Hyperlipidemia due to type 2 diabetes mellitus ([place]) (Chronic)    Hyperlipidemia with PAD and CVA/TIA-LDL goal less than 70 =>  His last labs were in July of last year and LDL was 78.  As part of this evaluation we will make sure that he gets a recheck of his lipids.  Low threshold to become more aggressive which would probably mean switching to rosuvastatin      Cerebrovascular disease (Chronic)    Prior CVA with now recent TIA.  He is on Plavix, statin as well as diabetes medications. Due to lowish blood pressures and subclavian disease, we are stopping lisinopril to allow for mild permissive hypertension especially since he is dizzy lightheaded feeling generalized weakness..  Needs referral to neurology as well as vascular surgery.      Relevant Orders   ECHOCARDIOGRAM COMPLETE BUBBLE STUDY   Ambulatory referral to Neurology   Atherosclerotic vascular disease (Chronic)    Subclavian artery disease and other vascular disease.  I suspect that he will need vascular evaluation of all beds.  We are to go ahead and do CTA of the chest arteries as well as Coronary CTA.  I suspect that he would probably need lower extremity Dopplers in the future as well. With his known disease, we will refer him back to vascular surgery.  He should reschedule that visit.      Relevant Orders   EKG 12-Lead (Completed)    TIA (transient ischemic attack)    Recent TIA.  Not necessarily explained by vasculature.  We are evaluating his his carotids but also need to evaluate for arrhythmia.  Plan: Zio patch monitor, 2D echo with bubble and carotid flow subclavian CTA      Essential hypertension (Chronic)    Actually hypotensive today clearly more left on the right with 30 point gradient, but with him having symptoms of the left arm, we need to allow some mild permissive hypertension especially with recent TIA and CVA in the past.  Plan: DC lisinopril for now.      Postural dizziness with near syncope (Chronic)    Is hard to tell whether this is due to hypotension or other neurologic issues.  He also has the carotid and subclavian stenosis.  I do think he needs to work on adequate hydration.  We will discontinue his lisinopril to avoid orthostatic hypotension.  Would let rather have mild permissive hypertension especially post TIA/stroke with him having ongoing symptoms.  Encourage adequate hydration.  He is also not eating well.  Needs to ensure that he is eating appropriately.  We discussed appropriate diet.  Needs to be really careful during the summer months with heat.  If he is can be outside he needs to be aggressive with hydration.      Relevant Orders   ECHOCARDIOGRAM COMPLETE BUBBLE STUDY   LONG TERM MONITOR (3-14 DAYS)   Subclavian arterial stenosis ([place]) - Primary (  Chronic)    Previous Dopplers that showed left subclavian stenosis.  With him having left arm weakness and numbness the question is is this neurologic or vascular.  Need to exclude both.  I agree wholeheartedly with Dr. Ralph Leyden that he needs to see vascular surgery but also probably needs to see neurology as well.  We will go ahead and order CTA of the great vessels of the chest in addition to Coronary CTA.  Somewhat frustrated that he got an MRI of his head done but not an MRA.  Aggressive risk factor modification with diabetes  control.  He is already on Plavix for his TIA.  He is on diabetic medications and omega-3 fatty acids along with statin. He is on low-dose lisinopril but is hypotensive today, and with concern for unilateral occlusion causing vascular symptoms probably do not need antihypertensive agent at this point. => Stop lisinopril.      Relevant Orders   CT ANGIO NECK W OR WO CONTRAST     Other   CKD stage 3 due to type 2 diabetes mellitus (HCC) (Chronic)    Creatinine is 1.66.  Need to be careful and judicious with his CT scan injections.  Need to hydrate preprocedure.  Since both procedure will require IV, I would like to at least give him 500 mL-1L NS starting in appropriate stages after IV placement through the procedure and following the procedure.  We will need to touch base CT scan team to ensure that this can be performed.      Shortness of breath on exertion (Chronic)    Exertional dyspnea with some chest tightness in a patient with multiple risk factors.  Plan: Check 2D echo and Coronary CTA      Precordial pain    Strange sounding chest discomfort but with exertional dyspnea and all his vascular issues a long-term smoker with diabetes and hyperlipidemia, I am concerned that potentially some of his issues are related to coronary ischemia.  Certainly if there is any potential need for subclavian revascularization we should do a coronary ischemic evaluation.  Plan: Coronary CTA       Relevant Orders   CT CORONARY MORPH W/CTA COR W/SCORE W/CA W/CM &/OR WO/CM   Controlled diabetes mellitus type 2 with complications (HCC) (Chronic)    Managed by PCP.  He is on Synjardy which provides cardiovascular benefit.  He is to avoid dehydration.      TOBACCO ABUSE (Chronic)    Not interested in quitting.  We talked about it for little while but he did not want to discuss any further.       ===================================  HPI:    Jon [name] is a 76 y.o. male Current Heavy  Smoker (with no interest in quitting) with a history of prior CVA (on Plavix -> minor residual right arm and leg weakness), PNA, DM-2, COPD (centrilobular emphysema) who is being seen today for the ER F/U of recent TIA at the request of Hinda Kehr, MD.  Jon [name] was last seen by his PCP on October 15, 2021, was doing relatively well.  However contacted PCP on November 05, 2021 with complaints of leg weakness and dizziness off and on for a year as well as some dyspnea.  Referral placed to vascular surgery.  Recent Hospitalizations:  11/11/2021-[facility] ER with TIA: Presented with slurred speech.  Had been on the phone at 10 PM and started noticing slurred speech.  Became scared.  Waited for 30 minutes and then called  EMS.  Symptoms completely resolved before he reached the ER.  Felt well and almost back to his normal self shortly after.  No residual symptoms. Also noted having episodes of exertional dyspnea-noting exertional dyspnea and bilateral leg weakness with walking. MRI brain ordered => no acute infarct.  Multiple small vessel infarcts noted with findings of chronic ischemic microangiopathy.  Reviewed  CV studies:    The following studies were reviewed today: (if available, images/films reviewed: From Epic Chart or Care Everywhere) TTE 09/14/2019: EF 60 to 65%.  No RWMA.  GR 1 DD -  Mild LA dilation. Normal RV with normal RAP.[name]  Normal MV.  AOV sclerosis with no stenosis.  Ascending aorta 39 mm. Myoview 12/08/2017-Dr. Revankar: EF 70-75%.  No ischemia or infarction.  LOW RISK. Carotid Dopplers 10/24/2019: Color duplex indicates minimal heterogeneous plaque, with no hemodynamically significant stenosis by duplex criteria in the extracranial cerebrovascular circulation. Reversal of the left vertebral artery, suggesting a proximal left subclavian artery stenosis. Office based repeat bilateral upper extremity blood pressure assessment may be useful, as there is a borderline significant differential  measured on today's study.  Alternatively, CT angiogram of the chest to include the aortic arch may be considered if further workup is indicated.  Interval History:   Jon [name] presents here today for evaluation with a litany of complaints and questions or concerns.  He tells a detailed history but albeit tangential and convoluted. He says that 15 years ago he had a sensation weakness all over and was unable to lift his right arm and leg.  He was able to walk.  He went to [facility] and was told he had a stroke since then he has had about 10% residual weakness in his right arm and leg.  He said that he be doing pretty well.  Was kind of released by neurology just was following up with his PCP and having no major issues from a symptom standpoint.  He then notes that about a month ago, he started noticing just a general sense of feeling weakness and dizziness.  He would wake up in the morning sometimes short of breath.  He was struck getting some shortness of breath with exertion.  Just generally feeling tired. The timeline may be little unusual, because according to PCPs notes, he was doing okay on June 6, but it was June 27 when he contacted Dr. Gardiner Ramus office with complaints of weakness and unsteady gait swaying with walking.[name]  He describes as feeling just bad slow weak and short of breath.  No real chest pain.  We will having noted in the June 6 note with Dr. Madilyn Fireman is that he had had an episode where he felt his legs get weak where he could not support his body.  He felt like he was a very hot day and maybe this got a little weak.  He had made negative meal that day either.  He also said he been drinking lots of caffeine free/diet [facility].  With the unsteady gait and dizziness episodes he was referred to vascular surgery to reassess coronary arteries.  He has not yet seen them, he actually apparently canceled the consult because of this visit after his TIA hospitalization.  We  briefly discussed the TIA episode where he went to the hospital.  Apparently all the symptoms resolved by time he had EMS arrival to go to the hospital.  He had no new weakness.  Was no longer dizzy or weak.  He did  however note exertional dyspnea-and this is where he tells me that he gets dyspneic when he gets up to go to the bathroom at night and walks to the bathroom he gets very short of breath.  He will then stand at the commode to urinate and when he does urinate he gets very dizzy and lightheaded so he may almost passed out.  He feels like he has a hard time even hitting the toilet.  He has not actually fallen down but he is complex.  Since the TIA spell, if he has been having almost dead arm sensation of his left arm to the point where he it can and moves flaccidly how it wants to move and not how he wants to move it.  He is also having slurred speech and word finding issues on occasion with some memory lapses.  He is concerned that he truly did have a stroke that was not diagnosed.  The other thing that is noted on his history is left subclavian stenosis that he was not really aware of.  He is not actually noticing any left arm claudication symptoms nor any subclavian steal symptoms, what he notes is a simply weakness or numbness of the left arm.  He describes it in an ulnar ray distribution but that he says is his whole hand.  He has had exertional dyspnea with some PND.  Difficult to assess if his orthopnea or just getting up to urinate and getting short of breath walking to the bathroom.  No real edema.  Has not had exertional chest tightness or pressure per se but he is mentioning multiple different symptoms today.  He does however occasionally have some tightness across the chest when he gets short of breath but not every time he does something.  It can happen both at rest and exertional.   REVIEWED OF SYSTEMS   ROS essentially per HPI. No real nausea vomiting or diarrhea. Unsteady gait  noted, dizziness, left arm weakness all per HPI.  I have reviewed and (if needed) personally updated the patient's problem list, medications, allergies, past medical and surgical history, social and family history.   PAST MEDICAL HISTORY   Past Medical History:  Diagnosis Date   BENIGN POSITIONAL VERTIGO 05/20/2010   Qualifier: Diagnosis of  By: Madilyn Fireman MD, Catherine     Diabetes mellitus    Epididymitis    Hyperlipemia    Hypertension    Stroke [facility]) 7/11    PAST SURGICAL HISTORY   Past Surgical History:  Procedure Laterality Date   PILONIDAL CYST EXCISION  1960, 61, 62, 63   VASECTOMY      Immunization History  Administered Date(s) Administered   Fluad Quad(high Dose 65+) 02/09/2019, 03/07/2020, 03/20/2021   Influenza Split 03/06/2011, 03/05/2012   Influenza Whole 04/14/2009   Influenza, High Dose Seasonal PF 03/26/2016, 01/27/2017, 02/09/2018   Influenza,inj,Quad PF,6+ Mos 03/09/2013, 12/27/2013, 03/21/2015   PFIZER(Purple Top)SARS-COV-2 Vaccination 09/02/2019, 09/27/2019, 04/10/2020   Pneumococcal Conjugate-13 09/26/2014   Pneumococcal Polysaccharide-23 03/15/2009, 12/03/2012   Tdap 05/21/2018    MEDICATIONS/ALLERGIES   Current Meds  Medication Sig   Accu-Chek Softclix Lancets lancets Use to check blood sugars twice daily   atorvastatin (LIPITOR) 40 MG tablet TAKE 1 TABLET BY MOUTH  DAILY   Blood Glucose Monitoring Suppl (ACCU-CHEK GUIDE ME) w/Device KIT Please fill test Accu-chek Test strips only for patient. Patient states he does not need the meter or Lancets just test strips. Dx. Code E11.8   clopidogrel (PLAVIX) 75 MG tablet  TAKE 1 TABLET BY MOUTH  DAILY   fish oil-omega-3 fatty acids 1000 MG capsule Take 4 g by mouth daily. Takes it once in a while   glucose blood (ACCU-CHEK GUIDE) test strip Dx DM E11.9. Check fasting blood sugar every morning.   Insulin Pen Needle (EASY TOUCH PEN NEEDLES) 31G X 8 MM MISC FOR USE WITH INJECTING  INSULIN DAILY   [EXPIRED]  ivabradine (CORLANOR) 7.5 MG TABS tablet Take 2 tablets (15 mg total) by mouth once for 1 dose. Take 2 hours prior to your CT scan   LEVEMIR FLEXTOUCH 100 UNIT/ML FlexTouch Pen INJECT SUBCUTANEOUSLY 50  UNITS DAILY (Patient taking differently: 30 Units. Patient takes 30 units daily)   lisinopril (ZESTRIL) 2.5 MG tablet TAKE 1 TABLET BY MOUTH DAILY   metoprolol tartrate (LOPRESSOR) 100 MG tablet Take 1 tablet (100 mg total) by mouth once for 1 dose. Take two hours prior to your CT.   predniSONE (DELTASONE) 20 MG tablet Take 2 tablets (40 mg total) by mouth daily with breakfast.   SYNJARDY 12.5-500 MG TABS TAKE 1 TABLET BY MOUTH  TWICE DAILY   traZODone (DESYREL) 50 MG tablet TAKE 1/2 TO 1 TABLET BY  MOUTH AT BEDTIME AS NEEDED  FOR SLEEP    Allergies  Allergen Reactions   Gabapentin Other (See Comments)    Nightmares.    Lisinopril Other (See Comments)    Hypotension   Metformin And Related Diarrhea    SOCIAL HISTORY/FAMILY HISTORY   Reviewed in Epic:   Social History   Tobacco Use   Smoking status: Every Day    Packs/day: 1.00    Years: 62.00    Total pack years: 62.00    Types: Cigarettes   Smokeless tobacco: Never  Substance Use Topics   Alcohol use: No   Drug use: No   Social History   Social History Narrative   Lives alone and Likes to play golf 2 x 3 times a week.   Family History  Problem Relation Age of Onset   Diabetes Other        family hx of   Colon cancer Neg Hx     OBJCTIVE -PE, EKG, labs   Wt Readings from Last 3 Encounters:  11/14/21 199 lb 3.2 oz (90.4 kg)  10/15/21 203 lb (92.1 kg)  05/02/21 216 lb (98 kg)    Physical Exam: BP 115/64 (BP Location: Right Arm, Patient Position: Sitting, Cuff Size: Normal)   Pulse 98   Ht 6' 1" (1.854 m)   Wt 199 lb 3.2 oz (90.4 kg)   SpO2 98%   BMI 26.28 kg/m   11/14/2021 9:44 AM 11/14/2021 9:45 AM  BP 86/56 115/64  BP Location Left Arm Right Arm  Patient Position Sitting Sitting  Cuff Size Normal Normal   Pulse 98      No data found.  Physical Exam Vitals reviewed.  Constitutional:      General: He is not in acute distress.    Appearance: Normal appearance. He is normal weight. He is ill-appearing (He just seems very flustered and confused). He is not toxic-appearing.     Comments: Well-groomed and well-nourished, but very talkative and circuitous conversation  HENT:     Head: Normocephalic and atraumatic.  Eyes:     Extraocular Movements: Extraocular movements intact.     Pupils: Pupils are equal, round, and reactive to light.  Neck:     Vascular: No carotid bruit (Not really carotid bruit, but left subclavian bruit  heard.).  Cardiovascular:     Rate and Rhythm: Normal rate. Rhythm irregular. Frequent Extrasystoles are present.    Chest Wall: PMI is not displaced.     Pulses: Decreased pulses (Left subclavian bruit).          Radial pulses are 2+ on the right side and 1+ on the left side.       Dorsalis pedis pulses are 1+ on the right side and 1+ on the left side.       Posterior tibial pulses are 1+ on the right side and 1+ on the left side.     Heart sounds: S1 normal and S2 normal. Murmur (Cannot exclude soft SEM) heard.     No friction rub. No gallop.  Pulmonary:     Effort: Pulmonary effort is normal. No respiratory distress.     Breath sounds: No stridor. Wheezing present. No rhonchi or rales.     Comments: Diffuse interstitial sounds but no rales or rhonchi. Chest:     Chest wall: No tenderness.  Abdominal:     General: Abdomen is flat. Bowel sounds are normal. There is no distension.     Palpations: Abdomen is soft. There is no mass.     Tenderness: There is no abdominal tenderness.     Hernia: No hernia is present.  Musculoskeletal:        General: No swelling.     Cervical back: Normal range of motion and neck supple.  Skin:    General: Skin is warm.     Comments: Seems either significant suntanned or somewhat sunburned.  Somewhat flushed.  Neurological:      Mental Status: He is alert and oriented to person, place, and time.     Motor: Weakness (Left arm is somewhat weak, but is use right arm that is usually weak.) present.     Gait: Gait normal.  Psychiatric:        Mood and Affect: Mood normal.     Comments: Very tangential speech, circuitous.  Often has memory lapses and word finding issues.  Are not sure if he is using this descriptively to explain how he was feeling or if he is actually having the symptoms.     Adult ECG Report  Rate: 79: Rhythm: indeterminate and Sinus rhythm with competing atrial junctional rhythm versus PACs and bigeminy.;  Left axis deviation with a possible pulmonary pattern.  Cannot exclude Septal Infarct, age undetermined with poor R wave progression also inferior infarct, age indeterminate.   ;   Narrative Interpretation: Abnormal EKG.  Recent Labs: Reviewed Lab Results  Component Value Date   CHOL 145 11/22/2020   HDL 42 11/22/2020   LDLCALC 78 11/22/2020   TRIG 157 (H) 11/22/2020   CHOLHDL 3.5 11/22/2020   Lab Results  Component Value Date   CREATININE 1.66 (H) 11/11/2021   BUN 22 11/11/2021   NA 135 11/11/2021   K 4.5 11/11/2021   CL 104 11/11/2021   CO2 24 11/11/2021      Latest Ref Rng & Units 11/11/2021   11:59 PM 11/22/2020    2:20 PM 12/11/2019    1:21 PM  CBC  WBC 4.0 - 10.5 K/uL 5.0  7.3  6.1   Hemoglobin 13.0 - 17.0 g/dL 14.8  15.4  14.6   Hematocrit 39.0 - 52.0 % 44.3  46.1  42.9   Platelets 150 - 400 K/uL 206  169  164     Lab Results  Component Value Date  HGBA1C 6.8 (A) 10/15/2021   Lab Results  Component Value Date   TSH 5.14 (H) 11/22/2020    ==================================================  COVID-19 Education: The signs and symptoms of COVID-19 were discussed with the patient and how to seek care for testing (follow up with PCP or arrange E-visit).    I spent a total of 41 minutes with the patient spent in direct patient consultation.  Additional time spent with chart  review  / charting (studies, outside notes, etc): 36 min Total Time: 77 min  Current medicines are reviewed at length with the patient today.  (+/- concerns) N/A  This visit occurred during the SARS-CoV-2 public health emergency.  Safety protocols were in place, including screening questions prior to the visit, additional usage of staff PPE, and extensive cleaning of exam room while observing appropriate contact time as indicated for disinfecting solutions.  Notice: This dictation was prepared with Dragon dictation along with smart phrase technology. Any transcriptional errors that result from this process are unintentional and may not be corrected upon review.   Studies Ordered:  Orders Placed This Encounter  Procedures   CT CORONARY MORPH W/CTA COR W/SCORE W/CA W/CM &/OR WO/CM   CT ANGIO NECK W OR WO CONTRAST   Ambulatory referral to Neurology   LONG TERM MONITOR (3-14 DAYS)   EKG 12-Lead   ECHOCARDIOGRAM COMPLETE BUBBLE STUDY   Meds ordered this encounter  Medications   metoprolol tartrate (LOPRESSOR) 100 MG tablet    Sig: Take 1 tablet (100 mg total) by mouth once for 1 dose. Take two hours prior to your CT.    Dispense:  1 tablet    Refill:  0   ivabradine (CORLANOR) 7.5 MG TABS tablet    Sig: Take 2 tablets (15 mg total) by mouth once for 1 dose. Take 2 hours prior to your CT scan    Dispense:  2 tablet    Refill:  0    Patient Instructions / Medication Changes & Studies & Tests Ordered   Patient Instructions  Medication Instructions:   Your physician recommends that you continue on your current medications as directed. Please refer to the Current Medication list given to you today.   *If you need a refill on your cardiac medications before your next appointment, please call your pharmacy*   Lab Work: None ordered   If you have labs (blood work) drawn today and your tests are completely normal, you will receive your results only by: [place] (if you have  MyChart) OR A paper copy in the mail If you have any lab test that is abnormal or we need to change your treatment, we will call you to review the results.   Testing/Procedures:  Your physician has requested that you have an echocardiogram in 4-5 weeks. Echocardiography is a painless test that uses sound waves to create images of your heart. It provides your doctor with information about the size and shape of your heart and how well your heart's chambers and valves are working. This procedure takes approximately one hour. There are no restrictions for this procedure.   Your physician has requested that you have cardiac CT. Cardiac computed tomography (CT) is a painless test that uses an x-ray machine to take clear, detailed pictures of your heart.    Your cardiac CT has been scheduled for 11/18/21 at 9 am at:  [facility] [place], [place] [address]  Please arrive 15 mins early for check-in and test prep.  Please follow these instructions carefully (unless otherwise directed):  On the Night Before the Test: Be sure to Drink plenty of water. Do not consume any caffeinated/decaffeinated beverages or chocolate 12 hours prior to your test.  On the Day of the Test: Drink plenty of water until 1 hour prior to the test. Do not eat any food 4 hours prior to the test. You may take your regular medications prior to the test.  Take metoprolol (Lopressor) and ivabradine (Corlanor) two hours prior to test. These have been sent into your pharmacy.       After the Test: Drink plenty of water. After receiving IV contrast, you may experience a mild flushed feeling. This is normal. On occasion, you may experience a mild rash up to 24 hours after the test. This is not dangerous. If this occurs, you can take Benadryl 25 mg and increase your fluid intake. If you experience trouble breathing, this can be serious. If it is severe call [phone] IMMEDIATELY. If it is  mild, please call our office.  Please allow 2-4 weeks for scheduling of routine cardiac CTs. Some insurance companies require a pre-authorization which may delay scheduling of this test.   For non-scheduling related questions, please contact the cardiac imaging nurse navigator should you have any questions/concerns: Marchia Bond, Cardiac Imaging Nurse Navigator Gordy Clement, Cardiac Imaging Nurse Navigator [facility] Direct Office Dial: [phone]   For scheduling needs, including cancellations and rescheduling, please call [place], [phone].    Your provider has ordered a heart monitor to wear for 14 days. This will be mailed to your home with instructions on placement. Once you have finished the time frame requested, you will return monitor in box provided.      Follow-Up: At [facility], you and your health needs are our priority.  As part of our continuing mission to provide you with exceptional heart care, we have created designated Provider Care Teams.  These Care Teams include your primary Cardiologist (physician) and Advanced Practice Providers (APPs -  Physician Assistants and Nurse Practitioners) who all work together to provide you with the care you need, when you need it.  We recommend signing up for the patient portal called "MyChart".  Sign up information is provided on this After Visit Summary.  MyChart is used to connect with patients for Virtual Visits (Telemedicine).  Patients are able to view lab/test results, encounter notes, upcoming appointments, etc.  Non-urgent messages can be sent to your provider as well.   To learn more about what you can do with MyChart, go to [email or web address].    Your next appointment:   6-8 week(s)  The format for your next appointment:   In Person  Provider:   You may see [name], MD or one of the following Advanced Practice Providers on your designated Care Team:   Murray Hodgkins,  NP Christell Faith, PA-C Cadence Kathlen Mody, [place]   Other Instructions N/A  Important Information About Sugar          [name], M.D., M.S. Interventional Cardiologist   Pager # [phone] Phone # [phone] [address] [place], [place] [address]   Thank you for choosing [facility] [place]!!

## 2021-11-14 ENCOUNTER — Ambulatory Visit (INDEPENDENT_AMBULATORY_CARE_PROVIDER_SITE_OTHER): Payer: Medicare Other

## 2021-11-14 ENCOUNTER — Ambulatory Visit: Payer: Medicare Other | Admitting: Cardiology

## 2021-11-14 ENCOUNTER — Encounter: Payer: Self-pay | Admitting: Physician Assistant

## 2021-11-14 ENCOUNTER — Encounter: Payer: Self-pay | Admitting: Cardiology

## 2021-11-14 VITALS — BP 115/64 | HR 98 | Ht 73.0 in | Wt 199.2 lb

## 2021-11-14 DIAGNOSIS — R42 Dizziness and giddiness: Secondary | ICD-10-CM

## 2021-11-14 DIAGNOSIS — R0602 Shortness of breath: Secondary | ICD-10-CM

## 2021-11-14 DIAGNOSIS — I679 Cerebrovascular disease, unspecified: Secondary | ICD-10-CM

## 2021-11-14 DIAGNOSIS — I7090 Unspecified atherosclerosis: Secondary | ICD-10-CM | POA: Diagnosis not present

## 2021-11-14 DIAGNOSIS — G459 Transient cerebral ischemic attack, unspecified: Secondary | ICD-10-CM

## 2021-11-14 DIAGNOSIS — E118 Type 2 diabetes mellitus with unspecified complications: Secondary | ICD-10-CM | POA: Diagnosis not present

## 2021-11-14 DIAGNOSIS — E1169 Type 2 diabetes mellitus with other specified complication: Secondary | ICD-10-CM

## 2021-11-14 DIAGNOSIS — R072 Precordial pain: Secondary | ICD-10-CM | POA: Diagnosis not present

## 2021-11-14 DIAGNOSIS — F17200 Nicotine dependence, unspecified, uncomplicated: Secondary | ICD-10-CM | POA: Diagnosis not present

## 2021-11-14 DIAGNOSIS — E1122 Type 2 diabetes mellitus with diabetic chronic kidney disease: Secondary | ICD-10-CM

## 2021-11-14 DIAGNOSIS — E782 Mixed hyperlipidemia: Secondary | ICD-10-CM

## 2021-11-14 DIAGNOSIS — I10 Essential (primary) hypertension: Secondary | ICD-10-CM

## 2021-11-14 DIAGNOSIS — R55 Syncope and collapse: Secondary | ICD-10-CM

## 2021-11-14 DIAGNOSIS — N183 Chronic kidney disease, stage 3 unspecified: Secondary | ICD-10-CM

## 2021-11-14 DIAGNOSIS — I771 Stricture of artery: Secondary | ICD-10-CM

## 2021-11-14 DIAGNOSIS — E785 Hyperlipidemia, unspecified: Secondary | ICD-10-CM

## 2021-11-14 DIAGNOSIS — F17210 Nicotine dependence, cigarettes, uncomplicated: Secondary | ICD-10-CM

## 2021-11-14 MED ORDER — METOPROLOL TARTRATE 100 MG PO TABS: 100.0000 mg | ORAL_TABLET | Freq: Once | ORAL | 0 refills | Status: DC

## 2021-11-14 MED ORDER — IVABRADINE HCL 7.5 MG PO TABS: 15.0000 mg | ORAL_TABLET | Freq: Once | ORAL | 0 refills | Status: AC

## 2021-11-14 NOTE — Patient Instructions (Signed)
Medication Instructions:   Your physician recommends that you continue on your current medications as directed. Please refer to the Current Medication list given to you today.   *If you need a refill on your cardiac medications before your next appointment, please call your pharmacy*   Lab Work: None ordered   If you have labs (blood work) drawn today and your tests are completely normal, you will receive your results only by: [place] (if you have MyChart) OR A paper copy in the mail If you have any lab test that is abnormal or we need to change your treatment, we will call you to review the results.   Testing/Procedures:  Your physician has requested that you have an echocardiogram in 4-5 weeks. Echocardiography is a painless test that uses sound waves to create images of your heart. It provides your doctor with information about the size and shape of your heart and how well your heart's chambers and valves are working. This procedure takes approximately one hour. There are no restrictions for this procedure.   Your physician has requested that you have cardiac CT. Cardiac computed tomography (CT) is a painless test that uses an x-ray machine to take clear, detailed pictures of your heart.    Your cardiac CT has been scheduled for 11/18/21 at 9 am at:  [facility] [place], [place] [address]  Please arrive 15 mins early for check-in and test prep.    Please follow these instructions carefully (unless otherwise directed):  On the Night Before the Test: Be sure to Drink plenty of water. Do not consume any caffeinated/decaffeinated beverages or chocolate 12 hours prior to your test.  On the Day of the Test: Drink plenty of water until 1 hour prior to the test. Do not eat any food 4 hours prior to the test. You may take your regular medications prior to the test.  Take metoprolol (Lopressor) and ivabradine (Corlanor) two hours prior to  test. These have been sent into your pharmacy.       After the Test: Drink plenty of water. After receiving IV contrast, you may experience a mild flushed feeling. This is normal. On occasion, you may experience a mild rash up to 24 hours after the test. This is not dangerous. If this occurs, you can take Benadryl 25 mg and increase your fluid intake. If you experience trouble breathing, this can be serious. If it is severe call 911 IMMEDIATELY. If it is mild, please call our office.  Please allow 2-4 weeks for scheduling of routine cardiac CTs. Some insurance companies require a pre-authorization which may delay scheduling of this test.   For non-scheduling related questions, please contact the cardiac imaging nurse navigator should you have any questions/concerns: Jon [name], Cardiac Imaging Nurse Navigator Jon [name], Cardiac Imaging Nurse Navigator [facility] Direct Office Dial: [phone]   For scheduling needs, including cancellations and rescheduling, please call Jon [name], [phone].    Your provider has ordered a heart monitor to wear for 14 days. This will be mailed to your home with instructions on placement. Once you have finished the time frame requested, you will return monitor in box provided.      Follow-Up: At [facility], you and your health needs are our priority.  As part of our continuing mission to provide you with exceptional heart care, we have created designated Provider Care Teams.  These Care Teams include your primary Cardiologist (physician) and Advanced Practice  Providers (APPs -  Physician Assistants and Nurse Practitioners) who all work together to provide you with the care you need, when you need it.  We recommend signing up for the patient portal called "MyChart".  Sign up information is provided on this After Visit Summary.  MyChart is used to connect with patients for Virtual Visits (Telemedicine).  Patients are  able to view lab/test results, encounter notes, upcoming appointments, etc.  Non-urgent messages can be sent to your provider as well.   To learn more about what you can do with MyChart, go to [email or web address].    Your next appointment:   6-8 week(s)  The format for your next appointment:   In Person  Provider:   You may see Jon Hew, MD or one of the following Advanced Practice Providers on your designated Care Team:   Jon Hodgkins, NP Jon Faith, PA-C Jon [name], [place]   Other Instructions N/A  Important Information About Sugar

## 2021-11-15 ENCOUNTER — Telehealth (HOSPITAL_COMMUNITY): Payer: Self-pay | Admitting: *Deleted

## 2021-11-15 NOTE — Telephone Encounter (Signed)
Reaching out to patient to offer assistance regarding upcoming cardiac imaging study; pt verbalizes understanding of appt date/time, parking situation and where to check in, pre-test NPO status and medications ordered, and verified current allergies; name and call back number provided for further questions should they arise  Rance Smithson RN Navigator Cardiac Imaging [facility] [phone] office [phone] cell  Patient to take 100mg metoprolol tartrate and 15mg ivabradine two hours prior to his cardiac CT scan. 

## 2021-11-16 ENCOUNTER — Encounter: Payer: Self-pay | Admitting: Cardiology

## 2021-11-16 ENCOUNTER — Other Ambulatory Visit: Payer: Self-pay | Admitting: Family Medicine

## 2021-11-16 DIAGNOSIS — G459 Transient cerebral ischemic attack, unspecified: Secondary | ICD-10-CM | POA: Insufficient documentation

## 2021-11-16 DIAGNOSIS — E118 Type 2 diabetes mellitus with unspecified complications: Secondary | ICD-10-CM

## 2021-11-16 DIAGNOSIS — R072 Precordial pain: Secondary | ICD-10-CM | POA: Insufficient documentation

## 2021-11-16 NOTE — Assessment & Plan Note (Signed)
Recent TIA.  Not necessarily explained by vasculature.  We are evaluating his his carotids but also need to evaluate for arrhythmia.  Plan: Zio patch monitor, 2D echo with bubble and carotid flow subclavian CTA

## 2021-11-16 NOTE — Assessment & Plan Note (Signed)
Previous Dopplers that showed left subclavian stenosis.  With him having left arm weakness and numbness the question is is this neurologic or vascular.  Need to exclude both.  I agree wholeheartedly with Dr. Ralph Leyden that he needs to see vascular surgery but also probably needs to see neurology as well.  We will go ahead and order CTA of the great vessels of the chest in addition to Coronary CTA.  Somewhat frustrated that he got an MRI of his head done but not an MRA.  Aggressive risk factor modification with diabetes control.  He is already on Plavix for his TIA.  He is on diabetic medications and omega-3 fatty acids along with statin. He is on low-dose lisinopril but is hypotensive today, and with concern for unilateral occlusion causing vascular symptoms probably do not need antihypertensive agent at this point. => Stop lisinopril.

## 2021-11-16 NOTE — Assessment & Plan Note (Signed)
Managed by PCP.  He is on Synjardy which provides cardiovascular benefit.  He is to avoid dehydration.

## 2021-11-16 NOTE — Assessment & Plan Note (Signed)
Exertional dyspnea with some chest tightness in a patient with multiple risk factors.  Plan: Check 2D echo and Coronary CTA

## 2021-11-16 NOTE — Assessment & Plan Note (Signed)
Subclavian artery disease and other vascular disease.  I suspect that he will need vascular evaluation of all beds.  We are to go ahead and do CTA of the chest arteries as well as Coronary CTA.  I suspect that he would probably need lower extremity Dopplers in the future as well. With his known disease, we will refer him back to vascular surgery.  He should reschedule that visit.

## 2021-11-16 NOTE — Assessment & Plan Note (Signed)
Strange sounding chest discomfort but with exertional dyspnea and all his vascular issues a long-term smoker with diabetes and hyperlipidemia, I am concerned that potentially some of his issues are related to coronary ischemia.  Certainly if there is any potential need for subclavian revascularization we should do a coronary ischemic evaluation.  Plan: Coronary CTA

## 2021-11-16 NOTE — Assessment & Plan Note (Signed)
Is hard to tell whether this is due to hypotension or other neurologic issues.  He also has the carotid and subclavian stenosis.  I do think he needs to work on adequate hydration.  We will discontinue his lisinopril to avoid orthostatic hypotension.  Would let rather have mild permissive hypertension especially post TIA/stroke with him having ongoing symptoms.  Encourage adequate hydration.  He is also not eating well.  Needs to ensure that he is eating appropriately.  We discussed appropriate diet.  Needs to be really careful during the summer months with heat.  If he is can be outside he needs to be aggressive with hydration.

## 2021-11-16 NOTE — Assessment & Plan Note (Signed)
Creatinine is 1.66.  Need to be careful and judicious with his CT scan injections.  Need to hydrate preprocedure.  Since both procedure will require IV, I would like to at least give him 500 mL-1L NS starting in appropriate stages after IV placement through the procedure and following the procedure.  We will need to touch base CT scan team to ensure that this can be performed.

## 2021-11-16 NOTE — Assessment & Plan Note (Signed)
Actually hypotensive today clearly more left on the right with 30 point gradient, but with him having symptoms of the left arm, we need to allow some mild permissive hypertension especially with recent TIA and CVA in the past.  Plan: DC lisinopril for now.

## 2021-11-16 NOTE — Assessment & Plan Note (Addendum)
Hyperlipidemia with PAD and CVA/TIA-LDL goal less than 70 =>  His last labs were in July of last year and LDL was 78.  As part of this evaluation we will make sure that he gets a recheck of his lipids.  Low threshold to become more aggressive which would probably mean switching to rosuvastatin

## 2021-11-16 NOTE — Assessment & Plan Note (Addendum)
Prior CVA with now recent TIA.  He is on Plavix, statin as well as diabetes medications. Due to lowish blood pressures and subclavian disease, we are stopping lisinopril to allow for mild permissive hypertension especially since he is dizzy lightheaded feeling generalized weakness..  Needs referral to neurology as well as vascular surgery.

## 2021-11-16 NOTE — Assessment & Plan Note (Signed)
Not interested in quitting.  We talked about it for little while but he did not want to discuss any further.

## 2021-11-17 ENCOUNTER — Other Ambulatory Visit: Payer: Self-pay | Admitting: Family Medicine

## 2021-11-18 ENCOUNTER — Ambulatory Visit (INDEPENDENT_AMBULATORY_CARE_PROVIDER_SITE_OTHER): Payer: Medicare Other | Admitting: Nurse Practitioner

## 2021-11-18 ENCOUNTER — Ambulatory Visit
Admission: RE | Admit: 2021-11-18 | Discharge: 2021-11-18 | Disposition: A | Discharge status: Home / Self Care | Payer: Medicare Other | Source: Ambulatory Visit | Attending: Cardiology | Admitting: Cardiology

## 2021-11-18 DIAGNOSIS — R072 Precordial pain: Secondary | ICD-10-CM | POA: Insufficient documentation

## 2021-11-18 DIAGNOSIS — I2510 Atherosclerotic heart disease of native coronary artery without angina pectoris: Secondary | ICD-10-CM

## 2021-11-18 MED ORDER — IOHEXOL 350 MG/ML SOLN
75.0000 mL | Freq: Once | INTRAVENOUS | Status: AC | PRN
  Administered 2021-11-18: 75 mL via INTRAVENOUS

## 2021-11-18 MED ORDER — NITROGLYCERIN 0.4 MG SL SUBL
0.8000 mg | SUBLINGUAL_TABLET | Freq: Once | SUBLINGUAL | Status: AC
  Filled 2021-11-18: qty 25

## 2021-11-18 MED ORDER — IOHEXOL 350 MG/ML SOLN: 100.0000 mL | Freq: Once | INTRAVENOUS | Status: DC | PRN

## 2021-11-18 MED ORDER — SODIUM CHLORIDE 0.9 % IV BOLUS
1000.0000 mL | Freq: Once | INTRAVENOUS | Status: AC
Start: 2021-11-18 — End: 2021-11-18
  Administered 2021-11-18: 1000 mL via INTRAVENOUS

## 2021-11-18 MED ORDER — NITROGLYCERIN 0.4 MG SL SUBL
SUBLINGUAL_TABLET | SUBLINGUAL | Status: AC
  Administered 2021-11-18: 0.8 mg via SUBLINGUAL
  Filled 2021-11-18: qty 2

## 2021-11-18 MED ORDER — DIAZEPAM 5 MG PO TABS
5.0000 mg | ORAL_TABLET | Freq: Once | ORAL | Status: AC
Start: 2021-11-18 — End: 2021-11-18
  Administered 2021-11-18: 5 mg via ORAL
  Filled 2021-11-18: qty 1

## 2021-11-18 NOTE — Progress Notes (Signed)
Patient tolerated procedure well. Ambulate w/o difficulty. Denies any lightheadedness or being dizzy. Pt denies any pain at this time. Sitting in chair, drinking water provided. Pt is encouraged to drink additional water throughout the day and reason explained to patient. Patient verbalized understanding and all questions answered. ABC intact. No further needs at this time. Discharge from procedure area w/o issues.  

## 2021-11-18 NOTE — Discharge Instr - Other Orders (Signed)
P tc/o weakness symptoms on arrival to CT. Pt believes it could be related to medication taken before scan. Vitals measured, pt stable.

## 2021-11-19 ENCOUNTER — Ambulatory Visit
Admission: RE | Admit: 2021-11-19 | Discharge: 2021-11-19 | Disposition: A | Discharge status: Home / Self Care | Payer: Medicare Other | Source: Ambulatory Visit | Attending: Cardiology | Admitting: Cardiology

## 2021-11-19 ENCOUNTER — Telehealth: Payer: Self-pay | Admitting: Family Medicine

## 2021-11-19 DIAGNOSIS — I771 Stricture of artery: Secondary | ICD-10-CM | POA: Insufficient documentation

## 2021-11-19 DIAGNOSIS — G458 Other transient cerebral ischemic attacks and related syndromes: Secondary | ICD-10-CM

## 2021-11-19 DIAGNOSIS — I6523 Occlusion and stenosis of bilateral carotid arteries: Secondary | ICD-10-CM | POA: Diagnosis not present

## 2021-11-19 DIAGNOSIS — I6503 Occlusion and stenosis of bilateral vertebral arteries: Secondary | ICD-10-CM | POA: Diagnosis not present

## 2021-11-19 DIAGNOSIS — Z8673 Personal history of transient ischemic attack (TIA), and cerebral infarction without residual deficits: Secondary | ICD-10-CM | POA: Diagnosis not present

## 2021-11-19 HISTORY — DX: Other transient cerebral ischemic attacks and related syndromes: G45.8

## 2021-11-19 LAB — POCT I-STAT CREATININE: Creatinine, Ser: 1.5 mg/dL — ABNORMAL HIGH (ref 0.61–1.24)

## 2021-11-19 MED ORDER — IOHEXOL 350 MG/ML SOLN
60.0000 mL | Freq: Once | INTRAVENOUS | Status: AC | PRN
  Administered 2021-11-19: 60 mL via INTRAVENOUS

## 2021-11-19 NOTE — Telephone Encounter (Signed)
Please call patient and make sure that he was able to get an appoint with neurology.  I know they had offered him an appointment and he declined.  I suspect he may have had a conflict but I do want to make sure that he does have an appointment on the books.  Even the vascular doctor wanted him to have an appointment with neuro.

## 2021-11-20 NOTE — Telephone Encounter (Signed)
Pt is scheduled with Dr. Shawn Route on 11/29/2021.  Charyl Bigger, CMA

## 2021-11-21 DIAGNOSIS — R42 Dizziness and giddiness: Secondary | ICD-10-CM | POA: Diagnosis not present

## 2021-11-21 DIAGNOSIS — R55 Syncope and collapse: Secondary | ICD-10-CM

## 2021-11-22 ENCOUNTER — Telehealth: Payer: Self-pay | Admitting: Cardiology

## 2021-11-22 ENCOUNTER — Other Ambulatory Visit: Payer: Self-pay | Admitting: *Deleted

## 2021-11-22 DIAGNOSIS — Z87891 Personal history of nicotine dependence: Secondary | ICD-10-CM

## 2021-11-22 DIAGNOSIS — F17210 Nicotine dependence, cigarettes, uncomplicated: Secondary | ICD-10-CM

## 2021-11-22 DIAGNOSIS — Z122 Encounter for screening for malignant neoplasm of respiratory organs: Secondary | ICD-10-CM

## 2021-11-22 NOTE — Telephone Encounter (Signed)
Secure chat message received from Dr. Ellyn Hack as below:  Maybe best to have him seen next week - So that we can set him up for cath -- depending on Sx - can wait for me on 7/27 or can have [place] or [facility] take a look next week if Sx are worse. I do think Vasc Sgx input & Echo pre-cath would be helpful.

## 2021-11-22 NOTE — Telephone Encounter (Signed)
I called and spoke with the patient. I advised him that per Dr. Ellyn Hack, we could see him in the clinic next week with an APP and discuss his Cardiac CT in more detail and possible heart cath.  The patient is aware if it is decided to proceed with the heart cath, then Dr. Ellyn Hack may be able to do this in [place] on 12/05/21.  The patient voices understanding and is agreeable. He will try to have his daughter come with him to the appointment.

## 2021-11-22 NOTE — Telephone Encounter (Signed)
Cardiac CT:  Leonie Man, MD  11/19/2021  1:16 AM EDT     Coronary CT angiogram results: Coronary Calcium Score is 562.  This moderately elevated risk.   The CT scan suggests greater than 70% narrowings in the RCA and LAD.  The recommendation is cardiac catheterization. We need to see the results of his echocardiogram as well, but I think he should be called back and be seen sooner than August 31 to discuss his chest discomfort and short of breath symptoms and the consideration of a heart catheterization.  I think we also need to see his CT angiogram of the neck as well to make sure that there is no significant carotid stenosis. => I am in clinic on Thursday Jul 13th, but not again until the 2 weeks after that since I am rounding next week at [facility].  I think we can allow him to get his neck CT scan done ASAP & get him ASAP after (Echo & Chest CTA) to discuss Pros/Cons of Cardiac Cath -- > it may be that he sees an APP.   [name], MD

## 2021-11-22 NOTE — Telephone Encounter (Signed)
I called and spoke with the patient regarding his Cardiac CTA & CTA neck. The patient advised he had already read his results on mychart and was aware he is having "2 separate issues."  I have discussed both of the patient's scans with him in detail.   We have discussed the need for a possible heart catheterization and what this procedure entails.  He is aware that same day stenting may be warranted with an additional scenario of possible CABG needing to be discussed.  The patient is aware I am working on finding him a work in spot for after his echocardiogram on 11/29/21. He is advised myself or our scheduling team will reach out to him with a confirmed OV date/ time.   I have also messaged Dr. Ellyn Hack to make sure I do no need to work the patient in with an APP next week. After that, a 1st available will be with Dr. Ellyn Hack on 12/05/21.  Awaiting MD response.

## 2021-11-22 NOTE — Telephone Encounter (Signed)
CTA Neck:  Jon Man, MD  11/22/2021  9:34 AM EDT     Neck CT shows severe disease in the left vertebral artery and left subclavian artery which can explain the cold painful left arm.  Both carotids have moderate blockages of less than 50%. 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   He needs to make sure that he reestablishes his appointment if we can schedule for him that is fine with vascular surgery.   Aretha Parrot, MD  Jon Man, MD; Emily Filbert, RN He should be good to go.  He is scheduled on July 24 with [name].  Unless you think we need to get him in sooner, in which case we can work on it.  Thank you so much for seeing him I really appreciate it!   Barnetta Chapel

## 2021-11-27 ENCOUNTER — Encounter: Payer: Self-pay | Admitting: Medical

## 2021-11-27 ENCOUNTER — Ambulatory Visit (INDEPENDENT_AMBULATORY_CARE_PROVIDER_SITE_OTHER): Payer: Medicare Other | Admitting: Medical

## 2021-11-27 ENCOUNTER — Telehealth: Payer: Self-pay | Admitting: Cardiology

## 2021-11-27 VITALS — BP 126/95 | HR 75 | Ht 73.0 in | Wt 198.2 lb

## 2021-11-27 DIAGNOSIS — I771 Stricture of artery: Secondary | ICD-10-CM

## 2021-11-27 DIAGNOSIS — F17200 Nicotine dependence, unspecified, uncomplicated: Secondary | ICD-10-CM | POA: Diagnosis not present

## 2021-11-27 DIAGNOSIS — R072 Precordial pain: Secondary | ICD-10-CM | POA: Diagnosis not present

## 2021-11-27 DIAGNOSIS — Z8673 Personal history of transient ischemic attack (TIA), and cerebral infarction without residual deficits: Secondary | ICD-10-CM

## 2021-11-27 DIAGNOSIS — E782 Mixed hyperlipidemia: Secondary | ICD-10-CM

## 2021-11-27 DIAGNOSIS — N183 Chronic kidney disease, stage 3 unspecified: Secondary | ICD-10-CM

## 2021-11-27 DIAGNOSIS — E1122 Type 2 diabetes mellitus with diabetic chronic kidney disease: Secondary | ICD-10-CM

## 2021-11-27 DIAGNOSIS — R42 Dizziness and giddiness: Secondary | ICD-10-CM | POA: Diagnosis not present

## 2021-11-27 DIAGNOSIS — G459 Transient cerebral ischemic attack, unspecified: Secondary | ICD-10-CM

## 2021-11-27 DIAGNOSIS — R55 Syncope and collapse: Secondary | ICD-10-CM | POA: Diagnosis not present

## 2021-11-27 DIAGNOSIS — R931 Abnormal findings on diagnostic imaging of heart and coronary circulation: Secondary | ICD-10-CM | POA: Diagnosis not present

## 2021-11-27 DIAGNOSIS — I2510 Atherosclerotic heart disease of native coronary artery without angina pectoris: Secondary | ICD-10-CM

## 2021-11-27 NOTE — Progress Notes (Signed)
Cardiology Office Note:    Date:  11/28/2021   ID:  Jon [name], DOB April 07, 1946, MRN [id number]  PCP:  [name], MD  [facility] Cardiologist:  [name], MD  [place] Electrophysiologist:  None   Referring MD: [name], *   Chief Complaint: follow-up Cardiac CTA  History of Present Illness:    Jon [name] is a 76 y.o. male with a hx of HLD, TIA, HTN, postural dizziness, subclavian artery stenosis, CKD stage 3 who presents for follow-up for cardiac CTA  Seen 11/14/21 for DOE, atypical chest pain, TIA, and left arm numbness. Cardiac CTA, heart monitor, 2D echo with bubble and carotid flow subclavian CTA were ordered. CTA neck was ordered for subclavian artery stenosis. Will likely need to see VVS and neurology.   Cardiac CTA showed coronary calcium score of 562, 67th percentile, >70% stenosis in the pRCA and mLAD, cardiac cath was recommended.   CTA neck showed multifocal severe stenosis of the V2 vertebral artery, severe stenosis of the proximal left subclavian artery, bilateral carotid bifurcation ICA atherosclerosis without greater than 50% stenosis.   Today, the imaging studies were reviewed. The patient has an appointment with vascular surgery on Monday. We discussed cardiac cath, but he would like to wait until he sees vascular surgery and they give their recommendations. He denies anginal symptoms. No LLE, orthopnea or pnd. He is wearing a Zio heart monitor. He has the echo bubble study tomorrow. He is still smoking.  Past Medical History:  Diagnosis Date   BENIGN POSITIONAL VERTIGO 05/20/2010   Qualifier: Diagnosis of  By: Madilyn Fireman MD, Catherine     Diabetes mellitus    Epididymitis    Hyperlipemia    Hypertension    Stroke [facility]) 7/11    Past Surgical History:  Procedure Laterality Date   PILONIDAL CYST EXCISION  1960, 61, 62, 63   VASECTOMY      Current Medications: Current Meds  Medication Sig   Accu-Chek Softclix  Lancets lancets Use to check blood sugars twice daily   atorvastatin (LIPITOR) 40 MG tablet TAKE 1 TABLET BY MOUTH  DAILY   Blood Glucose Monitoring Suppl (ACCU-CHEK GUIDE ME) w/Device KIT Please fill test Accu-chek Test strips only for patient. Patient states he does not need the meter or Lancets just test strips. Dx. Code E11.8   clopidogrel (PLAVIX) 75 MG tablet TAKE 1 TABLET BY MOUTH  DAILY   fish oil-omega-3 fatty acids 1000 MG capsule Take 4 g by mouth daily. Takes it once in a while   glucose blood (ACCU-CHEK GUIDE) test strip Dx DM E11.9. Check fasting blood sugar every morning.   Insulin Pen Needle (EASY TOUCH PEN NEEDLES) 31G X 8 MM MISC FOR USE WITH INJECTING  INSULIN DAILY   ketoconazole (NIZORAL) 2 % cream Apply topically daily.   LEVEMIR FLEXPEN 100 UNIT/ML FlexPen INJECT SUBCUTANEOUSLY 50 UNITS  DAILY   lisinopril (ZESTRIL) 2.5 MG tablet TAKE 1 TABLET BY MOUTH DAILY   metoprolol tartrate (LOPRESSOR) 100 MG tablet Take 1 tablet (100 mg total) by mouth once for 1 dose. Take two hours prior to your CT.   predniSONE (DELTASONE) 20 MG tablet Take 2 tablets (40 mg total) by mouth daily with breakfast.   SYNJARDY 12.5-500 MG TABS TAKE 1 TABLET BY MOUTH  TWICE DAILY   traZODone (DESYREL) 50 MG tablet TAKE 1/2 TO 1 TABLET BY  MOUTH AT BEDTIME AS NEEDED  FOR SLEEP     Allergies:  Gabapentin, Lisinopril, and Metformin and related   Social History   Socioeconomic History   Marital status: Divorced    Spouse name: Not on file   Number of children: 2   Years of education: 12th grade   Highest education level: High school graduate  Occupational History   Occupation: Retired  Tobacco Use   Smoking status: Every Day    Packs/day: 1.00    Years: 62.00    Total pack years: 62.00    Types: Cigarettes   Smokeless tobacco: Never  Substance and Sexual Activity   Alcohol use: No   Drug use: No   Sexual activity: Not on file  Other Topics Concern   Not on file  Social History  Narrative   Lives alone and Likes to play golf 2 x 3 times a week.   Social Determinants of Health   Financial Resource Strain: Low Risk  (07/26/2021)   Overall Financial Resource Strain (CARDIA)    Difficulty of Paying Living Expenses: Not hard at all  Food Insecurity: No Food Insecurity (07/26/2021)   Hunger Vital Sign    Worried About Running Out of Food in the Last Year: Never true    Ran Out of Food in the Last Year: Never true  Transportation Needs: No Transportation Needs (07/26/2021)   PRAPARE - Hydrologist (Medical): No    Lack of Transportation (Non-Medical): No  Physical Activity: Insufficiently Active (07/26/2021)   Exercise Vital Sign    Days of Exercise per Week: 2 days    Minutes of Exercise per Session: 20 min  Stress: No Stress Concern Present (07/26/2021)   [place]    Feeling of Stress : Not at all  Social Connections: Socially Isolated (07/26/2021)   Social Connection and Isolation Panel [NHANES]    Frequency of Communication with Friends and Family: More than three times a week    Frequency of Social Gatherings with Friends and Family: Once a week    Attends Religious Services: Never    Marine scientist or Organizations: No    Attends Music therapist: Never    Marital Status: Divorced     Family History: The patient's family history includes Diabetes in an other family member. There is no history of Colon cancer.  ROS:   Please see the history of present illness.     All other systems reviewed and are negative.  EKGs/Labs/Other Studies Reviewed:    The following studies were reviewed today:  Cardiac CTA 11/2021 IMPRESSION: 1. Coronary calcium score of 562. This was 67th percentile for age and sex matched control.   2. Normal coronary origin with right dominance.   3. Non calcified plaque causing severe stenosis (>70%) in the proximal  RCA and mid LAD.   4. CAD-RADS 4 Severe stenosis. (70-99% or > 50% left main). Cardiac catheterization is recommended.   5. Consider symptom-guided anti-ischemic pharmacotherapy as well as risk factor modification per guideline directed care.   Electronically Signed: By: Kate Sable M.D. On: 11/18/2021 13:17  CTA neck 11/21/21   IMPRESSION: 1. Multifocal severe stenosis of the left V2 vertebral artery. 2. Severe stenosis of the proximal left subclavian artery. 3. Bilateral carotid bifurcation ICA atherosclerosis without greater than 50% stenosis. 4. Partially imaged left parotid tail mass/neoplasm, increased in size relative to CT neck from 2018.  EKG:  EKG is  ordered today.  The ekg ordered today demonstrates NSR  75bpm, LAD, septal/inf infarct  Recent Labs: 11/11/2021: ALT 18; BUN 22; Hemoglobin 14.8; Platelets 206; Potassium 4.5; Sodium 135 11/19/2021: Creatinine, Ser 1.50  Recent Lipid Panel    Component Value Date/Time   CHOL 145 11/22/2020 1420   TRIG 157 (H) 11/22/2020 1420   HDL 42 11/22/2020 1420   CHOLHDL 3.5 11/22/2020 1420   VLDL 39 (H) 12/25/2015 1119   LDLCALC 78 11/22/2020 1420    Physical Exam:    VS:  BP (!) 126/95 (BP Location: Right Arm)   Pulse 75   Ht _0  (1.854 m)   Wt 198 lb 3.2 oz (89.9 kg)   SpO2 100%   BMI 26.15 kg/m     Wt Readings from Last 3 Encounters:  11/27/21 198 lb 3.2 oz (89.9 kg)  11/14/21 199 lb 3.2 oz (90.4 kg)  10/15/21 203 lb (92.1 kg)     GEN:  Well nourished, well developed in no acute distress HEENT: Normal NECK: No JVD; No carotid bruits LYMPHATICS: No lymphadenopathy CARDIAC: RRR, no murmurs, rubs, gallops RESPIRATORY:  Clear to auscultation without rales, wheezing or rhonchi  ABDOMEN: Soft, non-tender, non-distended MUSCULOSKELETAL:  No edema; No deformity  SKIN: Warm and dry NEUROLOGIC:  Alert and oriented x 3 PSYCHIATRIC:  Normal affect   ASSESSMENT:    1. TIA (transient ischemic attack)   2.  Coronary artery disease involving native coronary artery of native heart without angina pectoris   3. History of stroke   4. Precordial pain   5. Abnormal cardiac CT angiography   6. Hyperlipidemia, mixed   7. Subclavian arterial stenosis (HCC)   8. Postural dizziness with near syncope   9. CKD stage 3 due to type 2 diabetes mellitus (Jon [name])   10. TOBACCO ABUSE    PLAN:    In order of problems listed above:  TIA H/o CVA Recent ER visit for TIA, he did not want to be admitted. Pending echo bubble study and heart monitor. Continue Plavix and statin. He was referred to Neurology. Lisinopril discontinued to allow for permissive hypertension.  CAD Abnormal Cardiac CTA Precordial chest pain Cardiac CTA showed >70% stenosis. He needs cardiac cath, but would like to wait until he sees vascular for their recommendations. He denies anginal symptoms. I offered cardiac cath for next week, but he would like to wait. Continue Plavix, statin and BB therapy.   HLD LDL 78 11/2020. Needs updated labs. Can increase Lipitor at follow-up.   PVD Recent CTA neck showed multifocal severe stenosis of the left V2 vertebral artery, severe stenosis of the proximal left subclavian artery, b/l carotid bifurcation ICA without greater than 50% stenosis. He has upcoming appointment with vascular surgery.  Subclavian stenosis Recent CTA neck showed severe stenosis of proximal left subclavian artery. Plan to see Vascular as above.   Postural dizziness He is wearing heart monitor at today's visit. Lisinopril discontinued at the last visit, BP is good today. Symptoms mildly improved.   Tobacco use He is still smoking, not interested in quitting.   CKD stage 3 Monitor with contrast imaging studies. Most recent showed stable Scr at 1.5.    Disposition: Follow up in 3 week(s) with MD/APP      Signed, Brianne Maina Ninfa Meeker, PA-C  11/28/2021 10:20 AM    [facility]

## 2021-11-27 NOTE — Telephone Encounter (Signed)
Pt is sched for ECHO on 7/20 and ECHO w/ bubble study the next day on 7/21. He would like to know if he needs both tests. Forwarding to Dr. Ellyn Hack for his recommendation.

## 2021-11-27 NOTE — Patient Instructions (Addendum)
Medication Instructions:  Your physician recommends that you continue on your current medications as directed. Please refer to the Current Medication list given to you today.  *If you need a refill on your cardiac medications before your next appointment, please call your pharmacy*  Lab Work: NONE ordered at this time of appointment   If you have labs (blood work) drawn today and your tests are completely normal, you will receive your results only by: [place] (if you have MyChart) OR A paper copy in the mail If you have any lab test that is abnormal or we need to change your treatment, we will call you to review the results.  Testing/Procedures: NONE ordered at this time of appointment    Follow-Up: At [facility], you and your health needs are our priority.  As part of our continuing mission to provide you with exceptional heart care, we have created designated Provider Care Teams.  These Care Teams include your primary Cardiologist (physician) and Advanced Practice Providers (APPs -  Physician Assistants and Nurse Practitioners) who all work together to provide you with the care you need, when you need it.   Your next appointment:   3 week(s)  The format for your next appointment:   In Person  Provider:   You will see one of the following Advanced Practice Providers on your designated Care Team:   Murray Hodgkins, NP Christell Faith, PA-C Cadence Kathlen Mody, [place]    Other Instructions   Important Information About Sugar

## 2021-11-27 NOTE — Telephone Encounter (Signed)
New Message"       Patient says he is scheduled for an Echo on 11-28-21 and Echo Bubble on 11-29-21. He wants to know if he is supposed to have both test?

## 2021-11-28 ENCOUNTER — Ambulatory Visit: Payer: Medicare Other | Admitting: Physician Assistant

## 2021-11-28 ENCOUNTER — Other Ambulatory Visit: Payer: Self-pay | Admitting: Cardiology

## 2021-11-28 ENCOUNTER — Ambulatory Visit (INDEPENDENT_AMBULATORY_CARE_PROVIDER_SITE_OTHER): Payer: Medicare Other

## 2021-11-28 DIAGNOSIS — R42 Dizziness and giddiness: Secondary | ICD-10-CM

## 2021-11-28 DIAGNOSIS — I679 Cerebrovascular disease, unspecified: Secondary | ICD-10-CM

## 2021-11-28 DIAGNOSIS — R55 Syncope and collapse: Secondary | ICD-10-CM

## 2021-11-28 HISTORY — PX: TRANSTHORACIC ECHOCARDIOGRAM: SHX275

## 2021-11-28 LAB — ECHOCARDIOGRAM COMPLETE
AR max vel: 2.94 cm2
AV Area VTI: 2.85 cm2
AV Area mean vel: 2.65 cm2
AV Mean grad: 3 mmHg
AV Peak grad: 4.4 mmHg
Ao pk vel: 1.05 m/s
Area-P 1/2: 1.81 cm2
S' Lateral: 2.1 cm

## 2021-11-28 NOTE — Telephone Encounter (Signed)
No healing except the echo that was done today.  It was supposed to be ordered as the echo with bubble study but I think the regular echo was ordered by mistake.  [name], MD

## 2021-11-29 ENCOUNTER — Ambulatory Visit (HOSPITAL_COMMUNITY): Payer: Medicare Other

## 2021-11-29 ENCOUNTER — Ambulatory Visit: Payer: Medicare Other | Admitting: Physician Assistant

## 2021-11-29 NOTE — Telephone Encounter (Signed)
The echo was ordered as a bubble study, but I believe IV access was unsuccessful on this patient.

## 2021-11-29 NOTE — Telephone Encounter (Signed)
So they went ahead & did echo ?  If so, the o only part this still needs to be done is bubble study?  The whole point was that he wanted the echo bubble study because of his history of stroke now therefore patient has come in for yet again procedure. I think we hold off - he will have na IV for Cath - can possible have them come over & complete bubble in Specials.?? - sound like an option.  [name], MD

## 2021-12-01 DIAGNOSIS — I6529 Occlusion and stenosis of unspecified carotid artery: Secondary | ICD-10-CM | POA: Insufficient documentation

## 2021-12-01 NOTE — H&P (View-Only) (Signed)
MRN : [id number]  Jon [name] is a 76 y.o. (04/26/1946) male who presents with chief complaint of check carotid arteries.  History of Present Illness:  The patient is seen for evaluation of carotid stenosis. The carotid stenosis was identified after he was seen in the emergency room at [facility] on November 12, 2021 for symptoms of slurred speech.  At that time he denied any new weakness in his arms or his legs.  At the time no c/o of dizziness or syncope.  The symptoms resolved after short period and he was found to be asymptomatic on evaluation in the emergency room.  The patient denies amaurosis fugax. There is no recent history of TIA symptoms or focal motor deficits. Some complaints of dizziness or syncopal-like symptoms but not regularly occurring.  There is no prior documented CVA.  There is no history of migraine headaches. There is no history of seizures.  The patient is taking enteric-coated aspirin 81 mg daily.  No recent shortening of the patient's walking distance or new symptoms consistent with claudication.  No history of rest pain symptoms. No new ulcers or wounds of the lower extremities have occurred.  There is no history of DVT, PE or superficial thrombophlebitis. No recent episodes of angina or shortness of breath documented.    I have personally reviewed the CT angiogram.   CT angiogram dated 11/19/2021 impression:  1. Multifocal severe stenosis of the left V2 vertebral artery. 2. Severe stenosis of the proximal left subclavian artery. 3. Bilateral carotid bifurcation ICA atherosclerosis without greater than 50% stenosis. 4. Partially imaged left parotid tail mass/neoplasm, increased in size relative to CT neck from 2018.  No outpatient medications have been marked as taking for the 12/02/21 encounter (Appointment) with [name], Dolores Lory, MD.    Past Medical History:  Diagnosis Date   BENIGN POSITIONAL VERTIGO 05/20/2010   Qualifier: Diagnosis of   By: Madilyn Fireman MD, Catherine     Diabetes mellitus    Epididymitis    Hyperlipemia    Hypertension    Stroke [facility]) 7/11    Past Surgical History:  Procedure Laterality Date   PILONIDAL CYST EXCISION  1960, 61, 62, 63   VASECTOMY      Social History Social History   Tobacco Use   Smoking status: Every Day    Packs/day: 1.00    Years: 62.00    Total pack years: 62.00    Types: Cigarettes   Smokeless tobacco: Never  Substance Use Topics   Alcohol use: No   Drug use: No    Family History Family History  Problem Relation Age of Onset   Diabetes Other        family hx of   Colon cancer Neg Hx     Allergies  Allergen Reactions   Gabapentin Other (See Comments)    Nightmares.    Lisinopril Other (See Comments)    Hypotension   Metformin And Related Diarrhea     REVIEW OF SYSTEMS (Negative unless checked)  Constitutional: '[]'$ Weight loss  '[]'$ Fever  '[]'$ Chills Cardiac: '[]'$ Chest pain   '[]'$ Chest pressure   '[]'$ Palpitations   '[]'$ Shortness of breath when laying flat   '[]'$ Shortness of breath with exertion. Vascular:  '[x]'$ Pain in legs with walking   '[]'$ Pain in legs at rest  '[]'$ History of DVT   '[]'$ Phlebitis   '[]'$ Swelling in legs   '[]'$ Varicose veins   '[]'$ Non-healing ulcers Pulmonary:   '[]'$ Uses home oxygen   '[]'$ Productive cough   '[]'$ Hemoptysis   '[]'$   Wheeze  '[]'$ COPD   '[]'$ Asthma Neurologic:  '[]'$ Dizziness   '[]'$ Seizures   '[]'$ History of stroke   '[]'$ History of TIA  '[]'$ Aphasia   '[]'$ Vissual changes   '[]'$ Weakness or numbness in arm   '[]'$ Weakness or numbness in leg Musculoskeletal:   '[]'$ Joint swelling   '[]'$ Joint pain   '[]'$ Low back pain Hematologic:  '[]'$ Easy bruising  '[]'$ Easy bleeding   '[]'$ Hypercoagulable state   '[]'$ Anemic Gastrointestinal:  '[]'$ Diarrhea   '[]'$ Vomiting  '[]'$ Gastroesophageal reflux/heartburn   '[]'$ Difficulty swallowing. Genitourinary:  '[]'$ Chronic kidney disease   '[]'$ Difficult urination  '[]'$ Frequent urination   '[]'$ Blood in urine Skin:  '[]'$ Rashes   '[]'$ Ulcers  Psychological:  '[]'$ History of anxiety   '[]'$  History of major  depression.  Physical Examination  There were no vitals filed for this visit. There is no height or weight on file to calculate BMI. Gen: WD/WN, NAD Head: Superior/AT, No temporalis wasting.  Ear/Nose/Throat: Hearing grossly intact, nares w/o erythema or drainage Eyes: PER, EOMI, sclera nonicteric.  Neck: Supple, no masses.  No bruit or JVD.  Pulmonary:  Good air movement, no audible wheezing, no use of accessory muscles.  Cardiac: RRR, normal S1, S2, no Murmurs. Vascular:  carotid bruit noted Vessel Right Left  Radial Palpable Palpable  Carotid  Palpable  Palpable  Subclav  Palpable Not palpable  Brachial  Palpable Not palpable  Radial  Palpable Not palpable  Gastrointestinal: soft, non-distended. No guarding/no peritoneal signs.  Musculoskeletal: M/S 5/5 throughout.  No visible deformity.  Neurologic: CN 2-12 intact. Pain and light touch intact in extremities.  Symmetrical.  Speech is fluent. Motor exam as listed above. Psychiatric: Judgment intact, Mood & affect appropriate for pt's clinical situation. Dermatologic: No rashes or ulcers noted.  No changes consistent with cellulitis.   CBC Lab Results  Component Value Date   WBC 5.0 11/11/2021   HGB 14.8 11/11/2021   HCT 44.3 11/11/2021   MCV 95.7 11/11/2021   PLT 206 11/11/2021    BMET    Component Value Date/Time   NA 135 11/11/2021 2359   K 4.5 11/11/2021 2359   CL 104 11/11/2021 2359   CO2 24 11/11/2021 2359   GLUCOSE 261 (H) 11/11/2021 2359   BUN 22 11/11/2021 2359   CREATININE 1.50 (H) 11/19/2021 1510   CREATININE 1.38 (H) 03/20/2021 1122   CALCIUM 8.8 (L) 11/11/2021 2359   GFRNONAA 43 (L) 11/11/2021 2359   GFRNONAA 51 (L) 09/07/2019 0033   GFRAA 55 (L) 12/11/2019 1321   GFRAA 59 (L) 09/07/2019 0033   Estimated Creatinine Clearance: 48.1 mL/min (A) (by C-G formula based on SCr of 1.5 mg/dL (H)).  COAG No results found for: "INR", "PROTIME"  Radiology ECHOCARDIOGRAM COMPLETE  Result Date: 11/28/2021     ECHOCARDIOGRAM REPORT   Patient Name:   HARJIT LEIDER Date of Exam: 11/28/2021 Medical Rec #:  [id number]           Height:       73.0 in Accession #:    [id number]          Weight:       198.2 lb Date of Birth:  12/17/1945            BSA:          2.143 m Patient Age:    36 years            BP:           130/78 mmHg Patient Gender: M  HR:           62 bpm. Exam Location:  [facility] Procedure: 2D Echo, Cardiac Doppler and Color Doppler Indications:    R42 Lightheaded; R55 Syncope  History:        Patient has prior history of Echocardiogram examinations, most                 recent 09/14/2019. TIA, Signs/Symptoms:Syncope; Risk                 Factors:Hypertension, Diabetes, Dyslipidemia and Current Smoker.  Sonographer:    Wilkie Aye RVT Referring Phys: 4 DAVID W HARDING  Sonographer Comments: Suboptimal apical window. Unable to place IV; Bubble exam not performed IMPRESSIONS  1. Left ventricular ejection fraction, by estimation, is 55 to 60%. The left ventricle has normal function. The left ventricle has no regional wall motion abnormalities. There is moderate asymmetric left ventricular hypertrophy of the basal-septal segment. Left ventricular diastolic parameters are consistent with Grade I diastolic dysfunction (impaired relaxation).  2. Right ventricular systolic function is normal. The right ventricular size is normal.  3. The mitral valve is normal in structure. No evidence of mitral valve regurgitation. No evidence of mitral stenosis.  4. The aortic valve is normal in structure. Aortic valve regurgitation is not visualized. Aortic valve sclerosis/calcification is present, without any evidence of aortic stenosis. Aortic valve area, by VTI measures 2.85 cm. Aortic valve mean gradient measures 3.0 mmHg.  5. The inferior vena cava is normal in size with greater than 50% respiratory variability, suggesting right atrial pressure of 3 mmHg. Comparison(s): 09/2019-. FINDINGS  Left Ventricle:  Left ventricular ejection fraction, by estimation, is 55 to 60%. The left ventricle has normal function. The left ventricle has no regional wall motion abnormalities. The left ventricular internal cavity size was normal in size. There is  moderate asymmetric left ventricular hypertrophy of the basal-septal segment. Left ventricular diastolic parameters are consistent with Grade I diastolic dysfunction (impaired relaxation). Right Ventricle: The right ventricular size is normal. No increase in right ventricular wall thickness. Right ventricular systolic function is normal. Left Atrium: Left atrial size was normal in size. Right Atrium: Right atrial size was normal in size. Pericardium: There is no evidence of pericardial effusion. Mitral Valve: The mitral valve is normal in structure. No evidence of mitral valve regurgitation. No evidence of mitral valve stenosis. Tricuspid Valve: The tricuspid valve is normal in structure. Tricuspid valve regurgitation is not demonstrated. No evidence of tricuspid stenosis. Aortic Valve: The aortic valve is normal in structure. Aortic valve regurgitation is not visualized. Aortic valve sclerosis/calcification is present, without any evidence of aortic stenosis. Aortic valve mean gradient measures 3.0 mmHg. Aortic valve peak  gradient measures 4.4 mmHg. Aortic valve area, by VTI measures 2.85 cm. Pulmonic Valve: The pulmonic valve was normal in structure. Pulmonic valve regurgitation is not visualized. No evidence of pulmonic stenosis. Aorta: The aortic root is normal in size and structure. Venous: The inferior vena cava is normal in size with greater than 50% respiratory variability, suggesting right atrial pressure of 3 mmHg. IAS/Shunts: No atrial level shunt detected by color flow Doppler.  LEFT VENTRICLE PLAX 2D LVIDd:         3.30 cm   Diastology LVIDs:         2.10 cm   LV e' medial:    5.18 cm/s LV PW:         0.80 cm   LV E/e' medial:  11.0 LV IVS:  1.60 cm   LV e'  lateral:   4.95 cm/s LVOT diam:     2.00 cm   LV E/e' lateral: 11.5 LV SV:         73 LV SV Index:   34 LVOT Area:     3.14 cm  RIGHT VENTRICLE             IVC RV Basal diam:  3.60 cm     IVC diam: 1.20 cm RV Mid diam:    2.60 cm RV S prime:     11.70 cm/s TAPSE (M-mode): 1.6 cm LEFT ATRIUM           Index       RIGHT ATRIUM           Index LA diam:      3.60 cm 1.68 cm/m  RA Area:     12.10 cm LA Vol (A4C): 16.6 ml 7.72 ml/m  RA Volume:   25.80 ml  12.04 ml/m  AORTIC VALVE                    PULMONIC VALVE AV Area (Vmax):    2.94 cm     PV Vmax:       0.67 m/s AV Area (Vmean):   2.65 cm     PV Peak grad:  1.8 mmHg AV Area (VTI):     2.85 cm AV Vmax:           105.00 cm/s AV Vmean:          75.000 cm/s AV VTI:            0.256 m AV Peak Grad:      4.4 mmHg AV Mean Grad:      3.0 mmHg LVOT Vmax:         98.30 cm/s LVOT Vmean:        63.200 cm/s LVOT VTI:          0.232 m LVOT/AV VTI ratio: 0.91  AORTA Ao Root diam: 3.60 cm Ao Asc diam:  3.40 cm Ao Arch diam: 1.9 cm MITRAL VALVE               TRICUSPID VALVE MV Area (PHT): 1.81 cm    TR Peak grad:   4.0 mmHg MV Decel Time: 420 msec    TR Vmax:        100.00 cm/s MV E velocity: 56.90 cm/s MV A velocity: 90.30 cm/s  SHUNTS MV E/A ratio:  0.63        Systemic VTI:  0.23 m                            Systemic Diam: 2.00 cm Ida Rogue MD Electronically signed by Ida Rogue MD Signature Date/Time: 11/28/2021/1:19:03 PM    Final    CT ANGIO NECK W OR WO CONTRAST  Result Date: 11/21/2021 CLINICAL DATA:  Stroke, follow up EXAM: CT ANGIOGRAPHY NECK TECHNIQUE: Multidetector CT imaging of the neck was performed using the standard protocol during bolus administration of intravenous contrast. Multiplanar CT image reconstructions and MIPs were obtained to evaluate the vascular anatomy. Carotid stenosis measurements (when applicable) are obtained utilizing NASCET criteria, using the distal internal carotid diameter as the denominator. RADIATION DOSE REDUCTION: This  exam was performed according to the departmental dose-optimization program which includes automated exposure control, adjustment of the mA and/or kV according to patient size and/or use of iterative  reconstruction technique. CONTRAST:  72m OMNIPAQUE IOHEXOL 350 MG/ML SOLN COMPARISON:  None Available. FINDINGS: Aortic arch: Great vessel origins are patent. Severe stenosis of the proximal left subclavian artery. Right carotid system: Atherosclerosis at the carotid bifurcation involving the proximal ICA without greater than 50% stenosis. Left carotid system: Mild atherosclerosis at the carotid bifurcation and involving the distal ICA without greater than 50% stenosis. Vertebral arteries: Multifocal severe stenosis of the left V2 vertebral artery. Right vertebral artery is patent without significant stenosis. Skeleton: Moderate to severe lower cervical degenerative change. Other neck: Partially imaged left parotid tail mass/neoplasm, increased in size relative to CT neck from 2018. Probable postsurgical changes associated with the right parotid. Upper chest: Visualized lung apices are clear. IMPRESSION: 1. Multifocal severe stenosis of the left V2 vertebral artery. 2. Severe stenosis of the proximal left subclavian artery. 3. Bilateral carotid bifurcation ICA atherosclerosis without greater than 50% stenosis. 4. Partially imaged left parotid tail mass/neoplasm, increased in size relative to CT neck from 2018. Electronically Signed   By: FMargaretha SheffieldM.D.   On: 11/21/2021 09:41   CT CORONARY MORPH W/CTA COR W/SCORE W/CA W/CM &/OR WO/CM  Addendum Date: 11/18/2021   ADDENDUM REPORT: 11/18/2021 15:02 ADDENDUM: The following report is an over-read performed by radiologist Dr. JDahlia Bailiffof [facility]iology, [place]on November 18, 2021. This over-read does not include interpretation of cardiac or coronary anatomy or pathology. The coronary CTA interpretation by the cardiologist is attached. COMPARISON:  CT Sep 21, 2017. FINDINGS: Vascular: No acute non-cardiac vascular finding. Mediastinum/Nodes: No pathologically enlarged mediastinal, or hilar lymph nodes in the visualized portions of the thorax. Distal esophagus is grossly unremarkable. Lungs/Pleura: Within the visualized portions of the thorax there are no suspicious appearing pulmonary nodules or masses, there is no acute consolidative airspace disease, no pleural effusions and no pneumothorax Upper Abdomen: Visualized portions of the upper abdomen are unremarkable. Musculoskeletal: There are no aggressive appearing lytic or blastic lesions noted in the visualized portions of the skeleton. IMPRESSION: No significant incidental noncardiac finding noted. Electronically Signed   By: JDahlia BailiffM.D.   On: 11/18/2021 15:02   Result Date: 11/18/2021 CLINICAL DATA:  Hx of PAD, TIA EXAM: Cardiac/Coronary  CTA TECHNIQUE: The patient was scanned on a [employer]. : A prospective scan was triggered in the descending thoracic aorta. Axial non-contrast 3 mm slices were carried out through the heart. The data set was analyzed on a dedicated work station and scored using the AAcadia Gantry rotation speed was 66 msecs and collimation was .6 mm. '100mg'$  of metoprolol, '15mg'$  ivabradine and 0.8 mg of sl NTG was given. The 3D data set was reconstructed in 5% intervals of the 60-95 % of the R-R cycle. Diastolic phases were analyzed on a dedicated work station using MPR, MIP and VRT modes. The patient received 75 cc of contrast. FINDINGS: Aorta:  Normal size.  No calcifications.  No dissection. Aortic Valve:  Trileaflet.  No calcifications. Coronary Arteries:  Normal coronary origin.  Right dominance. RCA is a dominant artery that gives rise to PDA and PLA. There is calcified and non calcified plaque causing severe proximal RCA stenosis (>70%). Left main gives rise to LAD and LCX arteries. LAD has calcified plaque in the ostium causing moderate stenosis (50-69%).  There is non calcified plaque in the mid LAD causing severe stenosis (>70%). LCX is a non-dominant artery that gives rise to two obtuse marginal branches. There is calcified plaque in the proximal LCx  causing minimal stenosis (<25%). Other findings: Normal pulmonary vein drainage into the left atrium. Normal left atrial appendage without a thrombus. Normal size of the pulmonary artery. IMPRESSION: 1. Coronary calcium score of 562. This was 67th percentile for age and sex matched control. 2. Normal coronary origin with right dominance. 3. Non calcified plaque causing severe stenosis (>70%) in the proximal RCA and mid LAD. 4. CAD-RADS 4 Severe stenosis. (70-99% or > 50% left main). Cardiac catheterization is recommended. 5. Consider symptom-guided anti-ischemic pharmacotherapy as well as risk factor modification per guideline directed care. Electronically Signed: By: Kate Sable M.D. On: 11/18/2021 13:17   MR BRAIN WO CONTRAST  Result Date: 11/12/2021 CLINICAL DATA:  Transient ischemic attack EXAM: MRI HEAD WITHOUT CONTRAST TECHNIQUE: Multiplanar, multiecho pulse sequences of the brain and surrounding structures were obtained without intravenous contrast. COMPARISON:  None Available. FINDINGS: Brain: No acute infarct, mass effect or extra-axial collection. No acute or chronic hemorrhage. There is multifocal hyperintense T2-weighted signal within the white matter. Generalized volume loss. There are multiple old cerebellar small vessel infarcts. There is an old left basal ganglia small vessel infarct. The midline structures are normal. Vascular: Major flow voids are preserved. Skull and upper cervical spine: Normal calvarium and skull base. Visualized upper cervical spine and soft tissues are normal. Sinuses/Orbits:No paranasal sinus fluid levels or advanced mucosal thickening. No mastoid or middle ear effusion. Normal orbits. IMPRESSION: 1. No acute intracranial abnormality. 2. Multiple old small vessel  infarcts and findings of chronic ischemic microangiopathy. Electronically Signed   By: Ulyses Jarred M.D.   On: 11/12/2021 02:10   CT HEAD WO CONTRAST (5MM)  Result Date: 11/11/2021 CLINICAL DATA:  Slurred speech. EXAM: CT HEAD WITHOUT CONTRAST TECHNIQUE: Contiguous axial images were obtained from the base of the skull through the vertex without intravenous contrast. RADIATION DOSE REDUCTION: This exam was performed according to the departmental dose-optimization program which includes automated exposure control, adjustment of the mA and/or kV according to patient size and/or use of iterative reconstruction technique. COMPARISON:  None Available. FINDINGS: Brain: There is mild cerebral atrophy with widening of the extra-axial spaces and ventricular dilatation. There are areas of decreased attenuation within the white matter tracts of the supratentorial brain, consistent with microvascular disease changes. Small chronic left basal ganglia lacunar infarcts are noted. Vascular: No hyperdense vessel or unexpected calcification. Skull: Normal. Negative for fracture or focal lesion. Sinuses/Orbits: No acute finding. Other: None. IMPRESSION: 1. No acute intracranial abnormality. 2. Generalized cerebral atrophy with chronic white matter small vessel ischemic changes. Electronically Signed   By: Virgina Norfolk M.D.   On: 11/11/2021 23:53     Assessment/Plan 1. Bilateral carotid artery stenosis Recommend:  Given the patient's asymptomatic subcritical stenosis no further invasive testing or surgery at this time.  Duplex ultrasound shows <70% stenosis bilaterally.  Continue antiplatelet therapy as prescribed Continue management of CAD, HTN and Hyperlipidemia Healthy heart diet,  encouraged exercise at least 4 times per week Follow up in 12 months with duplex ultrasound and physical exam    2. Subclavian arterial stenosis (HCC) Recommend:  The patient is symptomatic with respect to the subclavian  stenosis.  The patient has a lesion that is >70%.  Patient's CT angiography of the subclavian arteries confirms >70% left subclavian stenosis.  The anatomical considerations support stenting.  This was discussed in detail with the patient.  The risks, benefits and alternative therapies were reviewed in detail with the patient.  All questions were answered.  The patient agrees to  proceed with stenting of the left subclavian artery.  Continue antiplatelet therapy as prescribed. Continue management of CAD, HTN and Hyperlipidemia. Healthy heart diet, encouraged exercise at least 4 times per week.    3. Atherosclerotic vascular disease Recommend:  I do not find evidence of life style limiting vascular disease. The patient specifically denies life style limitation.  Previous noninvasive studies including ABI's of the legs do not identify critical vascular problems.  The patient should continue walking and begin a more formal exercise program. The patient should continue his antiplatelet therapy and aggressive treatment of the lipid abnormalities.  The patient is instructed to call the office if there is a significant change in the lower extremity symptoms, particularly if a wound develops or there is an abrupt increase in leg pain.  4. Centrilobular emphysema ([place]) Continue pulmonary medications and aerosols as already ordered, these medications have been reviewed and there are no changes at this time.    5. Controlled type 2 diabetes mellitus with complication, without long-term current use of insulin ([place]) Continue hypoglycemic medications as already ordered, these medications have been reviewed and there are no changes at this time.  Hgb A1C to be monitored as already arranged by primary service     [name], MD  12/01/2021 1:01 PM

## 2021-12-01 NOTE — Progress Notes (Signed)
MRN : [id number]  Jon [name] is a 76 y.o. ([birth year]) male who presents with chief complaint of check carotid arteries.  History of Present Illness:  The patient is seen for evaluation of carotid stenosis. The carotid stenosis was identified after he was seen in the emergency room at [facility] on November 12, 2021 for symptoms of slurred speech.  At that time he denied any new weakness in his arms or his legs.  No complaints of dizziness or syncopal-like symptoms.  The symptoms resolved after short period and he was found to be asymptomatic on evaluation in the emergency room.  The patient denies amaurosis fugax. There is no recent history of TIA symptoms or focal motor deficits. There is no prior documented CVA.  There is no history of migraine headaches. There is no history of seizures.  The patient is taking enteric-coated aspirin 81 mg daily.  No recent shortening of the patient's walking distance or new symptoms consistent with claudication.  No history of rest pain symptoms. No new ulcers or wounds of the lower extremities have occurred.  There is no history of DVT, PE or superficial thrombophlebitis. No recent episodes of angina or shortness of breath documented.    I have personally reviewed the CT angiogram.   CT angiogram dated 11/19/2021 impression:  1. Multifocal severe stenosis of the left V2 vertebral artery. 2. Severe stenosis of the proximal left subclavian artery. 3. Bilateral carotid bifurcation ICA atherosclerosis without greater than 50% stenosis. 4. Partially imaged left parotid tail mass/neoplasm, increased in size relative to CT neck from 2018.  No outpatient medications have been marked as taking for the 12/02/21 encounter (Appointment) with [name], Dolores Lory, MD.    Past Medical History:  Diagnosis Date   BENIGN POSITIONAL VERTIGO 05/20/2010   Qualifier: Diagnosis of  By: Madilyn Fireman MD, Catherine     Diabetes mellitus    Epididymitis     Hyperlipemia    Hypertension    Stroke [facility]) 7/11    Past Surgical History:  Procedure Laterality Date   PILONIDAL CYST EXCISION  1960, 61, 62, 63   VASECTOMY      Social History Social History   Tobacco Use   Smoking status: Every Day    Packs/day: 1.00    Years: 62.00    Total pack years: 62.00    Types: Cigarettes   Smokeless tobacco: Never  Substance Use Topics   Alcohol use: No   Drug use: No    Family History Family History  Problem Relation Age of Onset   Diabetes Other        family hx of   Colon cancer Neg Hx     Allergies  Allergen Reactions   Gabapentin Other (See Comments)    Nightmares.    Lisinopril Other (See Comments)    Hypotension   Metformin And Related Diarrhea     REVIEW OF SYSTEMS (Negative unless checked)  Constitutional: '[]'$ Weight loss  '[]'$ Fever  '[]'$ Chills Cardiac: '[]'$ Chest pain   '[]'$ Chest pressure   '[]'$ Palpitations   '[]'$ Shortness of breath when laying flat   '[]'$ Shortness of breath with exertion. Vascular:  '[x]'$ Pain in legs with walking   '[]'$ Pain in legs at rest  '[]'$ History of DVT   '[]'$ Phlebitis   '[]'$ Swelling in legs   '[]'$ Varicose veins   '[]'$ Non-healing ulcers Pulmonary:   '[]'$ Uses home oxygen   '[]'$ Productive cough   '[]'$ Hemoptysis   '[]'$ Wheeze  '[]'$ COPD   '[]'$ Asthma Neurologic:  '[]'$ Dizziness   '[]'$ Seizures   '[]'$   History of stroke   '[]'$ History of TIA  '[]'$ Aphasia   '[]'$ Vissual changes   '[]'$ Weakness or numbness in arm   '[]'$ Weakness or numbness in leg Musculoskeletal:   '[]'$ Joint swelling   '[]'$ Joint pain   '[]'$ Low back pain Hematologic:  '[]'$ Easy bruising  '[]'$ Easy bleeding   '[]'$ Hypercoagulable state   '[]'$ Anemic Gastrointestinal:  '[]'$ Diarrhea   '[]'$ Vomiting  '[]'$ Gastroesophageal reflux/heartburn   '[]'$ Difficulty swallowing. Genitourinary:  '[]'$ Chronic kidney disease   '[]'$ Difficult urination  '[]'$ Frequent urination   '[]'$ Blood in urine Skin:  '[]'$ Rashes   '[]'$ Ulcers  Psychological:  '[]'$ History of anxiety   '[]'$  History of major depression.  Physical Examination  There were no vitals filed for this  visit. There is no height or weight on file to calculate BMI. Gen: WD/WN, NAD Head: [place]/AT, No temporalis wasting.  Ear/Nose/Throat: Hearing grossly intact, nares w/o erythema or drainage Eyes: PER, EOMI, sclera nonicteric.  Neck: Supple, no masses.  No bruit or JVD.  Pulmonary:  Good air movement, no audible wheezing, no use of accessory muscles.  Cardiac: RRR, normal S1, S2, no Murmurs. Vascular:  carotid bruit noted Vessel Right Left  Radial Palpable Palpable  Carotid  Palpable  Palpable  Subclav  Palpable Not palpable  Brachial  Palpable Not palpable  Radial  Palpable Not palpable  Gastrointestinal: soft, non-distended. No guarding/no peritoneal signs.  Musculoskeletal: M/S 5/5 throughout.  No visible deformity.  Neurologic: CN 2-12 intact. Pain and light touch intact in extremities.  Symmetrical.  Speech is fluent. Motor exam as listed above. Psychiatric: Judgment intact, Mood & affect appropriate for pt's clinical situation. Dermatologic: No rashes or ulcers noted.  No changes consistent with cellulitis.   CBC Lab Results  Component Value Date   WBC 5.0 11/11/2021   HGB 14.8 11/11/2021   HCT 44.3 11/11/2021   MCV 95.7 11/11/2021   PLT 206 11/11/2021    BMET    Component Value Date/Time   NA 135 11/11/2021 2359   K 4.5 11/11/2021 2359   CL 104 11/11/2021 2359   CO2 24 11/11/2021 2359   GLUCOSE 261 (H) 11/11/2021 2359   BUN 22 11/11/2021 2359   CREATININE 1.50 (H) 11/19/2021 1510   CREATININE 1.38 (H) 03/20/2021 1122   CALCIUM 8.8 (L) 11/11/2021 2359   GFRNONAA 43 (L) 11/11/2021 2359   GFRNONAA 51 (L) 09/07/2019 0033   GFRAA 55 (L) 12/11/2019 1321   GFRAA 59 (L) 09/07/2019 0033   Estimated Creatinine Clearance: 48.1 mL/min (A) (by C-G formula based on SCr of 1.5 mg/dL (H)).  COAG No results found for: "INR", "PROTIME"  Radiology ECHOCARDIOGRAM COMPLETE  Result Date: 11/28/2021    ECHOCARDIOGRAM REPORT   Patient Name:   Jon [name] Date of Exam:  11/28/2021 Medical Rec #:  [id number]           Height:       73.0 in Accession #:    [id number]          Weight:       198.2 lb Date of Birth:  Jan 08, 1946            BSA:          2.143 m Patient Age:    71 years            BP:           130/78 mmHg Patient Gender: M                   HR:  62 bpm. Exam Location:  [place] Procedure: 2D Echo, Cardiac Doppler and Color Doppler Indications:    R42 Lightheaded; R55 Syncope  History:        Patient has prior history of Echocardiogram examinations, most                 recent 09/14/2019. TIA, Signs/Symptoms:Syncope; Risk                 Factors:Hypertension, Diabetes, Dyslipidemia and Current Smoker.  Sonographer:    Wilkie Aye RVT Referring Phys: 2 DAVID W HARDING  Sonographer Comments: Suboptimal apical window. Unable to place IV; Bubble exam not performed IMPRESSIONS  1. Left ventricular ejection fraction, by estimation, is 55 to 60%. The left ventricle has normal function. The left ventricle has no regional wall motion abnormalities. There is moderate asymmetric left ventricular hypertrophy of the basal-septal segment. Left ventricular diastolic parameters are consistent with Grade I diastolic dysfunction (impaired relaxation).  2. Right ventricular systolic function is normal. The right ventricular size is normal.  3. The mitral valve is normal in structure. No evidence of mitral valve regurgitation. No evidence of mitral stenosis.  4. The aortic valve is normal in structure. Aortic valve regurgitation is not visualized. Aortic valve sclerosis/calcification is present, without any evidence of aortic stenosis. Aortic valve area, by VTI measures 2.85 cm. Aortic valve mean gradient measures 3.0 mmHg.  5. The inferior vena cava is normal in size with greater than 50% respiratory variability, suggesting right atrial pressure of 3 mmHg. Comparison(s): 09/2019-. FINDINGS  Left Ventricle: Left ventricular ejection fraction, by estimation, is 55 to 60%. The left  ventricle has normal function. The left ventricle has no regional wall motion abnormalities. The left ventricular internal cavity size was normal in size. There is  moderate asymmetric left ventricular hypertrophy of the basal-septal segment. Left ventricular diastolic parameters are consistent with Grade I diastolic dysfunction (impaired relaxation). Right Ventricle: The right ventricular size is normal. No increase in right ventricular wall thickness. Right ventricular systolic function is normal. Left Atrium: Left atrial size was normal in size. Right Atrium: Right atrial size was normal in size. Pericardium: There is no evidence of pericardial effusion. Mitral Valve: The mitral valve is normal in structure. No evidence of mitral valve regurgitation. No evidence of mitral valve stenosis. Tricuspid Valve: The tricuspid valve is normal in structure. Tricuspid valve regurgitation is not demonstrated. No evidence of tricuspid stenosis. Aortic Valve: The aortic valve is normal in structure. Aortic valve regurgitation is not visualized. Aortic valve sclerosis/calcification is present, without any evidence of aortic stenosis. Aortic valve mean gradient measures 3.0 mmHg. Aortic valve peak  gradient measures 4.4 mmHg. Aortic valve area, by VTI measures 2.85 cm. Pulmonic Valve: The pulmonic valve was normal in structure. Pulmonic valve regurgitation is not visualized. No evidence of pulmonic stenosis. Aorta: The aortic root is normal in size and structure. Venous: The inferior vena cava is normal in size with greater than 50% respiratory variability, suggesting right atrial pressure of 3 mmHg. IAS/Shunts: No atrial level shunt detected by color flow Doppler.  LEFT VENTRICLE PLAX 2D LVIDd:         3.30 cm   Diastology LVIDs:         2.10 cm   LV e' medial:    5.18 cm/s LV PW:         0.80 cm   LV E/e' medial:  11.0 LV IVS:        1.60 cm   LV e'  lateral:   4.95 cm/s LVOT diam:     2.00 cm   LV E/e' lateral: 11.5 LV SV:          73 LV SV Index:   34 LVOT Area:     3.14 cm  RIGHT VENTRICLE             IVC RV Basal diam:  3.60 cm     IVC diam: 1.20 cm RV Mid diam:    2.60 cm RV S prime:     11.70 cm/s TAPSE (M-mode): 1.6 cm LEFT ATRIUM           Index       RIGHT ATRIUM           Index LA diam:      3.60 cm 1.68 cm/m  RA Area:     12.10 cm LA Vol (A4C): 16.6 ml 7.72 ml/m  RA Volume:   25.80 ml  12.04 ml/m  AORTIC VALVE                    PULMONIC VALVE AV Area (Vmax):    2.94 cm     PV Vmax:       0.67 m/s AV Area (Vmean):   2.65 cm     PV Peak grad:  1.8 mmHg AV Area (VTI):     2.85 cm AV Vmax:           105.00 cm/s AV Vmean:          75.000 cm/s AV VTI:            0.256 m AV Peak Grad:      4.4 mmHg AV Mean Grad:      3.0 mmHg LVOT Vmax:         98.30 cm/s LVOT Vmean:        63.200 cm/s LVOT VTI:          0.232 m LVOT/AV VTI ratio: 0.91  AORTA Ao Root diam: 3.60 cm Ao Asc diam:  3.40 cm Ao Arch diam: 1.9 cm MITRAL VALVE               TRICUSPID VALVE MV Area (PHT): 1.81 cm    TR Peak grad:   4.0 mmHg MV Decel Time: 420 msec    TR Vmax:        100.00 cm/s MV E velocity: 56.90 cm/s MV A velocity: 90.30 cm/s  SHUNTS MV E/A ratio:  0.63        Systemic VTI:  0.23 m                            Systemic Diam: 2.00 cm Ida Rogue MD Electronically signed by Ida Rogue MD Signature Date/Time: 11/28/2021/1:19:03 PM    Final    CT ANGIO NECK W OR WO CONTRAST  Result Date: 11/21/2021 CLINICAL DATA:  Stroke, follow up EXAM: CT ANGIOGRAPHY NECK TECHNIQUE: Multidetector CT imaging of the neck was performed using the standard protocol during bolus administration of intravenous contrast. Multiplanar CT image reconstructions and MIPs were obtained to evaluate the vascular anatomy. Carotid stenosis measurements (when applicable) are obtained utilizing NASCET criteria, using the distal internal carotid diameter as the denominator. RADIATION DOSE REDUCTION: This exam was performed according to the departmental dose-optimization program  which includes automated exposure control, adjustment of the mA and/or kV according to patient size and/or use of iterative reconstruction technique. CONTRAST:  27m OMNIPAQUE  IOHEXOL 350 MG/ML SOLN COMPARISON:  None Available. FINDINGS: Aortic arch: Great vessel origins are patent. Severe stenosis of the proximal left subclavian artery. Right carotid system: Atherosclerosis at the carotid bifurcation involving the proximal ICA without greater than 50% stenosis. Left carotid system: Mild atherosclerosis at the carotid bifurcation and involving the distal ICA without greater than 50% stenosis. Vertebral arteries: Multifocal severe stenosis of the left V2 vertebral artery. Right vertebral artery is patent without significant stenosis. Skeleton: Moderate to severe lower cervical degenerative change. Other neck: Partially imaged left parotid tail mass/neoplasm, increased in size relative to CT neck from 2018. Probable postsurgical changes associated with the right parotid. Upper chest: Visualized lung apices are clear. IMPRESSION: 1. Multifocal severe stenosis of the left V2 vertebral artery. 2. Severe stenosis of the proximal left subclavian artery. 3. Bilateral carotid bifurcation ICA atherosclerosis without greater than 50% stenosis. 4. Partially imaged left parotid tail mass/neoplasm, increased in size relative to CT neck from 2018. Electronically Signed   By: Margaretha Sheffield M.D.   On: 11/21/2021 09:41   CT CORONARY MORPH W/CTA COR W/SCORE W/CA W/CM &/OR WO/CM  Addendum Date: 11/18/2021   ADDENDUM REPORT: 11/18/2021 15:02 ADDENDUM: The following report is an over-read performed by radiologist Dr. Dahlia Bailiff of [facility], [place] on November 18, 2021. This over-read does not include interpretation of cardiac or coronary anatomy or pathology. The coronary CTA interpretation by the cardiologist is attached. COMPARISON:  CT Sep 21, 2017. FINDINGS: Vascular: No acute non-cardiac vascular finding.  Mediastinum/Nodes: No pathologically enlarged mediastinal, or hilar lymph nodes in the visualized portions of the thorax. Distal esophagus is grossly unremarkable. Lungs/Pleura: Within the visualized portions of the thorax there are no suspicious appearing pulmonary nodules or masses, there is no acute consolidative airspace disease, no pleural effusions and no pneumothorax Upper Abdomen: Visualized portions of the upper abdomen are unremarkable. Musculoskeletal: There are no aggressive appearing lytic or blastic lesions noted in the visualized portions of the skeleton. IMPRESSION: No significant incidental noncardiac finding noted. Electronically Signed   By: Dahlia Bailiff M.D.   On: 11/18/2021 15:02   Result Date: 11/18/2021 CLINICAL DATA:  Hx of PAD, TIA EXAM: Cardiac/Coronary  CTA TECHNIQUE: The patient was scanned on a [employer] scanner. : A prospective scan was triggered in the descending thoracic aorta. Axial non-contrast 3 mm slices were carried out through the heart. The data set was analyzed on a dedicated work station and scored using the [place]. Gantry rotation speed was 66 msecs and collimation was .6 mm. '100mg'$  of metoprolol, '15mg'$  ivabradine and 0.8 mg of sl NTG was given. The 3D data set was reconstructed in 5% intervals of the 60-95 % of the R-R cycle. Diastolic phases were analyzed on a dedicated work station using MPR, MIP and VRT modes. The patient received 75 cc of contrast. FINDINGS: Aorta:  Normal size.  No calcifications.  No dissection. Aortic Valve:  Trileaflet.  No calcifications. Coronary Arteries:  Normal coronary origin.  Right dominance. RCA is a dominant artery that gives rise to PDA and PLA. There is calcified and non calcified plaque causing severe proximal RCA stenosis (>70%). Left main gives rise to LAD and LCX arteries. LAD has calcified plaque in the ostium causing moderate stenosis (50-69%). There is non calcified plaque in the mid LAD causing severe  stenosis (>70%). LCX is a non-dominant artery that gives rise to two obtuse marginal branches. There is calcified plaque in the proximal LCx causing minimal stenosis (<25%). Other findings:  Normal pulmonary vein drainage into the left atrium. Normal left atrial appendage without a thrombus. Normal size of the pulmonary artery. IMPRESSION: 1. Coronary calcium score of 562. This was 67th percentile for age and sex matched control. 2. Normal coronary origin with right dominance. 3. Non calcified plaque causing severe stenosis (>70%) in the proximal RCA and mid LAD. 4. CAD-RADS 4 Severe stenosis. (70-99% or > 50% left main). Cardiac catheterization is recommended. 5. Consider symptom-guided anti-ischemic pharmacotherapy as well as risk factor modification per guideline directed care. Electronically Signed: By: Kate Sable M.D. On: 11/18/2021 13:17   MR BRAIN WO CONTRAST  Result Date: 11/12/2021 CLINICAL DATA:  Transient ischemic attack EXAM: MRI HEAD WITHOUT CONTRAST TECHNIQUE: Multiplanar, multiecho pulse sequences of the brain and surrounding structures were obtained without intravenous contrast. COMPARISON:  None Available. FINDINGS: Brain: No acute infarct, mass effect or extra-axial collection. No acute or chronic hemorrhage. There is multifocal hyperintense T2-weighted signal within the white matter. Generalized volume loss. There are multiple old cerebellar small vessel infarcts. There is an old left basal ganglia small vessel infarct. The midline structures are normal. Vascular: Major flow voids are preserved. Skull and upper cervical spine: Normal calvarium and skull base. Visualized upper cervical spine and soft tissues are normal. Sinuses/Orbits:No paranasal sinus fluid levels or advanced mucosal thickening. No mastoid or middle ear effusion. Normal orbits. IMPRESSION: 1. No acute intracranial abnormality. 2. 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  Electronically Signed   By: Ulyses Jarred M.D.   On: 11/12/2021 02:10   CT HEAD WO CONTRAST (5MM)  Result Date: 11/11/2021 CLINICAL DATA:  Slurred speech. EXAM: CT HEAD WITHOUT CONTRAST TECHNIQUE: Contiguous axial images were obtained from the base of the skull through the vertex without intravenous contrast. RADIATION DOSE REDUCTION: This exam was performed according to the departmental dose-optimization program which includes automated exposure control, adjustment of the mA and/or kV according to patient size and/or use of iterative reconstruction technique. COMPARISON:  None Available. FINDINGS: Brain: There is mild cerebral atrophy with widening of the extra-axial spaces and ventricular dilatation. There are areas of decreased attenuation within the white matter tracts of the supratentorial brain, consistent with microvascular disease changes. Small chronic left basal ganglia lacunar infarcts are noted. Vascular: No hyperdense vessel or unexpected calcification. Skull: Normal. Negative for fracture or focal lesion. Sinuses/Orbits: No acute finding. Other: None. IMPRESSION: 1. No acute intracranial abnormality. 2. Generalized cerebral atrophy with chronic white matter small vessel ischemic changes. Electronically Signed   By: Virgina Norfolk M.D.   On: 11/11/2021 23:53     Assessment/Plan There are no diagnoses linked to this encounter.   [name], MD  12/01/2021 1:01 PM

## 2021-12-02 ENCOUNTER — Encounter (INDEPENDENT_AMBULATORY_CARE_PROVIDER_SITE_OTHER): Payer: Self-pay | Admitting: Vascular Surgery

## 2021-12-02 ENCOUNTER — Ambulatory Visit (INDEPENDENT_AMBULATORY_CARE_PROVIDER_SITE_OTHER): Payer: Medicare Other | Admitting: Vascular Surgery

## 2021-12-02 VITALS — BP 117/68 | HR 76 | Resp 17 | Ht 73.0 in | Wt 201.0 lb

## 2021-12-02 DIAGNOSIS — I7090 Unspecified atherosclerosis: Secondary | ICD-10-CM

## 2021-12-02 DIAGNOSIS — J432 Centrilobular emphysema: Secondary | ICD-10-CM

## 2021-12-02 DIAGNOSIS — E118 Type 2 diabetes mellitus with unspecified complications: Secondary | ICD-10-CM | POA: Diagnosis not present

## 2021-12-02 DIAGNOSIS — I6523 Occlusion and stenosis of bilateral carotid arteries: Secondary | ICD-10-CM

## 2021-12-02 DIAGNOSIS — I771 Stricture of artery: Secondary | ICD-10-CM | POA: Diagnosis not present

## 2021-12-03 ENCOUNTER — Telehealth (INDEPENDENT_AMBULATORY_CARE_PROVIDER_SITE_OTHER): Payer: Self-pay

## 2021-12-03 NOTE — Telephone Encounter (Signed)
Spoke with the patient and he is scheduled with Dr. [name] on 12/17/21 with a 8:30 am arrival time to the MM. Pre-procedure instructions were discussed and will be mailed.

## 2021-12-05 ENCOUNTER — Encounter (INDEPENDENT_AMBULATORY_CARE_PROVIDER_SITE_OTHER): Payer: Self-pay | Admitting: Vascular Surgery

## 2021-12-09 NOTE — Telephone Encounter (Signed)
I called and spoke with the patient regarding his echo results. He voices understanding of these results.  I have advised him to keep his follow up appointment on 12/20/21 with Cadence Kathlen Mody, PA to discuss further the possibility of having a left heart cath done for his abnormal Cardiac CT.  Per the patient, he has "done some research on Google" and read that a 70 % blockage in the RCA in a person his age is "not that bad."  He is feeling at this time that he may forgo a cath and just "see how feels" and just "check in" in about 6 months.   I strongly advised the patient to keep his follow up on 12/20/21 for further discussion about a cath as we cannot be certain as to the degree of potential blockage at this point, especially with his history of stroke/ carotid stenosis and subclavian stenosis.  The patient states he is definitely going for his vascular angiogram on 12/17/21 with Dr. Ellyn Hack and will see how he feels after that. He was agreeable to keeping his appointment on 12/20/21 with Cadence, but again advised that since he was not having cardiac symptoms at this time he didn't feel like he needed the heart cath.   Will forward to Dr. Ellyn Hack as an Juluis Rainier.

## 2021-12-09 NOTE — Telephone Encounter (Signed)
Leonie Man, MD  11/28/2021  9:41 PM EDT     Echocardiogram results: Overall pretty normal study.  Normal pump function.  Normal wall motion and normal valves.   Normal pump function with ejection fraction of 55 to 60%.  No wall motion abnormalities-argues against prior heart attack.  Moderate thickening of the walls with grade 1 abnormal relaxation-normal for age. Normal right ventricle with normal pressures and right atrial pressures. Normal mitral valve Aortic valve calcification/sclerosis but no narrowing/stenosis.   [name], MD

## 2021-12-09 NOTE — Telephone Encounter (Signed)
Additionally, I am not sure if you realize it is not just the RCA, it is also the LAD that has a 70+ percent stenosis.  That artery is a very different issue than the RCA.  Treating the RCA medically is 1 thing, to the LAD is a totally different issue.  The fact that there are 2 arteries involved would indicate that there is probably multiple vessel disease.  From a general health perspective, the subclavian stenosis procedure is probably less pertinent and the heart catheterization is.  It is a very similar procedure as far as how it is performed, and the benefits of revascularization of the heart are probably higher than the benefits revascularization of the arm-from a survivability benefit.  [name], MD

## 2021-12-10 DIAGNOSIS — R55 Syncope and collapse: Secondary | ICD-10-CM | POA: Diagnosis not present

## 2021-12-10 DIAGNOSIS — R42 Dizziness and giddiness: Secondary | ICD-10-CM | POA: Diagnosis not present

## 2021-12-12 NOTE — Telephone Encounter (Signed)
Patient advised previously that he has multiple areas of concern on his Cardiac CT.   Patient has already been advised that he needs to keep his follow up as planned with Cadence Furth, PA on 12/20/21.

## 2021-12-17 ENCOUNTER — Encounter: Payer: Self-pay | Admitting: Vascular Surgery

## 2021-12-17 ENCOUNTER — Encounter
Admission: RE | Disposition: A | Discharge status: Home / Self Care | Payer: Self-pay | Source: Ambulatory Visit | Attending: Vascular Surgery

## 2021-12-17 ENCOUNTER — Other Ambulatory Visit: Payer: Self-pay

## 2021-12-17 ENCOUNTER — Ambulatory Visit
Admission: RE | Admit: 2021-12-17 | Discharge: 2021-12-17 | Disposition: A | Discharge status: Home / Self Care | Payer: Medicare Other | Source: Ambulatory Visit | Attending: Vascular Surgery | Admitting: Vascular Surgery

## 2021-12-17 DIAGNOSIS — F17210 Nicotine dependence, cigarettes, uncomplicated: Secondary | ICD-10-CM | POA: Diagnosis not present

## 2021-12-17 DIAGNOSIS — E119 Type 2 diabetes mellitus without complications: Secondary | ICD-10-CM | POA: Insufficient documentation

## 2021-12-17 DIAGNOSIS — I10 Essential (primary) hypertension: Secondary | ICD-10-CM | POA: Diagnosis not present

## 2021-12-17 DIAGNOSIS — E785 Hyperlipidemia, unspecified: Secondary | ICD-10-CM | POA: Insufficient documentation

## 2021-12-17 DIAGNOSIS — G458 Other transient cerebral ischemic attacks and related syndromes: Secondary | ICD-10-CM

## 2021-12-17 DIAGNOSIS — I708 Atherosclerosis of other arteries: Secondary | ICD-10-CM | POA: Insufficient documentation

## 2021-12-17 DIAGNOSIS — I2510 Atherosclerotic heart disease of native coronary artery without angina pectoris: Secondary | ICD-10-CM | POA: Diagnosis not present

## 2021-12-17 DIAGNOSIS — Z7982 Long term (current) use of aspirin: Secondary | ICD-10-CM | POA: Diagnosis not present

## 2021-12-17 DIAGNOSIS — I771 Stricture of artery: Secondary | ICD-10-CM

## 2021-12-17 DIAGNOSIS — I6523 Occlusion and stenosis of bilateral carotid arteries: Secondary | ICD-10-CM | POA: Insufficient documentation

## 2021-12-17 DIAGNOSIS — J432 Centrilobular emphysema: Secondary | ICD-10-CM | POA: Diagnosis not present

## 2021-12-17 HISTORY — PX: UPPER EXTREMITY ANGIOGRAPHY: CATH118270

## 2021-12-17 LAB — CREATININE, SERUM
Creatinine, Ser: 1.46 mg/dL — ABNORMAL HIGH (ref 0.61–1.24)
GFR, Estimated: 50 mL/min — ABNORMAL LOW (ref >60)

## 2021-12-17 LAB — GLUCOSE, CAPILLARY
Glucose-Capillary: 144 mg/dL — ABNORMAL HIGH (ref 70–99)
Glucose-Capillary: 128 mg/dL — ABNORMAL HIGH (ref 70–99)

## 2021-12-17 LAB — BUN: BUN: 16 mg/dL (ref 8–23)

## 2021-12-17 SURGERY — UPPER EXTREMITY ANGIOGRAPHY
Anesthesia: Moderate Sedation | Laterality: Left

## 2021-12-17 MED ORDER — SODIUM CHLORIDE 0.9 % IV SOLN: 250.0000 mL | INTRAVENOUS | Status: DC | PRN

## 2021-12-17 MED ORDER — DIPHENHYDRAMINE HCL 50 MG/ML IJ SOLN: 50.0000 mg | Freq: Once | INTRAMUSCULAR | Status: DC | PRN

## 2021-12-17 MED ORDER — SODIUM CHLORIDE 0.9 % IV SOLN: INTRAVENOUS | Status: DC

## 2021-12-17 MED ORDER — OXYCODONE HCL 5 MG PO TABS: 5.0000 mg | ORAL_TABLET | ORAL | Status: DC | PRN

## 2021-12-17 MED ORDER — HEPARIN SODIUM (PORCINE) 1000 UNIT/ML IJ SOLN
INTRAMUSCULAR | Status: DC | PRN
  Administered 2021-12-17: 7000 [IU] via INTRAVENOUS

## 2021-12-17 MED ORDER — CEFAZOLIN SODIUM-DEXTROSE 2-4 GM/100ML-% IV SOLN: 2.0000 g | INTRAVENOUS | Status: AC

## 2021-12-17 MED ORDER — ACETAMINOPHEN 325 MG PO TABS: 650.0000 mg | ORAL_TABLET | ORAL | Status: DC | PRN

## 2021-12-17 MED ORDER — MIDAZOLAM HCL 5 MG/5ML IJ SOLN
INTRAMUSCULAR | Status: AC
  Filled 2021-12-17: qty 5

## 2021-12-17 MED ORDER — FENTANYL CITRATE (PF) 100 MCG/2ML IJ SOLN
INTRAMUSCULAR | Status: DC | PRN
  Administered 2021-12-17 (×2): 50 ug via INTRAVENOUS

## 2021-12-17 MED ORDER — MIDAZOLAM HCL 2 MG/2ML IJ SOLN
INTRAMUSCULAR | Status: DC | PRN
  Administered 2021-12-17 (×3): 1 mg via INTRAVENOUS

## 2021-12-17 MED ORDER — HEPARIN SODIUM (PORCINE) 1000 UNIT/ML IJ SOLN
INTRAMUSCULAR | Status: AC
  Filled 2021-12-17: qty 10

## 2021-12-17 MED ORDER — FAMOTIDINE 20 MG PO TABS: 40.0000 mg | ORAL_TABLET | Freq: Once | ORAL | Status: DC | PRN

## 2021-12-17 MED ORDER — ONDANSETRON HCL 4 MG/2ML IJ SOLN: 4.0000 mg | Freq: Four times a day (QID) | INTRAMUSCULAR | Status: DC | PRN

## 2021-12-17 MED ORDER — ASPIRIN 81 MG PO TBEC: 325.0000 mg | DELAYED_RELEASE_TABLET | ORAL | Status: DC

## 2021-12-17 MED ORDER — MIDAZOLAM HCL 2 MG/ML PO SYRP
8.0000 mg | ORAL_SOLUTION | Freq: Once | ORAL | Status: AC | PRN
  Administered 2021-12-17: 8 mg via ORAL

## 2021-12-17 MED ORDER — SODIUM CHLORIDE 0.9 % IV SOLN
INTRAVENOUS | Status: DC
Start: 2021-12-17 — End: 2021-12-17

## 2021-12-17 MED ORDER — LABETALOL HCL 5 MG/ML IV SOLN: 10.0000 mg | INTRAVENOUS | Status: DC | PRN

## 2021-12-17 MED ORDER — CEFAZOLIN SODIUM-DEXTROSE 2-4 GM/100ML-% IV SOLN
INTRAVENOUS | Status: AC
  Administered 2021-12-17: 2 g via INTRAVENOUS
  Filled 2021-12-17: qty 100

## 2021-12-17 MED ORDER — METHYLPREDNISOLONE SODIUM SUCC 125 MG IJ SOLR: 125.0000 mg | Freq: Once | INTRAMUSCULAR | Status: DC | PRN

## 2021-12-17 MED ORDER — HYDROMORPHONE HCL 1 MG/ML IJ SOLN: 1.0000 mg | Freq: Once | INTRAMUSCULAR | Status: DC | PRN

## 2021-12-17 MED ORDER — ASPIRIN 81 MG PO TBEC: 81.0000 mg | DELAYED_RELEASE_TABLET | Freq: Every day | ORAL | 1 refills | Status: AC

## 2021-12-17 MED ORDER — MIDAZOLAM HCL 2 MG/ML PO SYRP
ORAL_SOLUTION | ORAL | Status: AC
  Filled 2021-12-17: qty 4

## 2021-12-17 MED ORDER — MORPHINE SULFATE (PF) 4 MG/ML IV SOLN: 2.0000 mg | INTRAVENOUS | Status: DC | PRN

## 2021-12-17 MED ORDER — IODIXANOL 320 MG/ML IV SOLN
INTRAVENOUS | Status: DC | PRN
  Administered 2021-12-17: 75 mL

## 2021-12-17 MED ORDER — HYDRALAZINE HCL 20 MG/ML IJ SOLN: 5.0000 mg | INTRAMUSCULAR | Status: DC | PRN

## 2021-12-17 MED ORDER — SODIUM CHLORIDE 0.9% FLUSH: 3.0000 mL | Freq: Two times a day (BID) | INTRAVENOUS | Status: DC

## 2021-12-17 MED ORDER — FENTANYL CITRATE (PF) 100 MCG/2ML IJ SOLN
INTRAMUSCULAR | Status: AC
  Filled 2021-12-17: qty 2

## 2021-12-17 MED ORDER — SODIUM CHLORIDE 0.9% FLUSH: 3.0000 mL | INTRAVENOUS | Status: DC | PRN

## 2021-12-17 SURGICAL SUPPLY — 21 items
BALLN LUTONIX AV 9X40X75 (BALLOONS) ×2
BALLN LUTONIX DCB 5X40X130 (BALLOONS) ×2
BALLOON LUTONIX AV 9X40X75 (BALLOONS) IMPLANT
BALLOON LUTONIX DCB 5X40X130 (BALLOONS) IMPLANT
CATH ANGIO 5F PIGTAIL 100CM (CATHETERS) ×1 IMPLANT
CATH BEACON 5 .038 100 VERT TP (CATHETERS) ×1 IMPLANT
COVER PROBE U/S 5X48 (MISCELLANEOUS) ×1 IMPLANT
DEVICE STARCLOSE SE CLOSURE (Vascular Products) ×1 IMPLANT
DEVICE TORQUE .025-.038 (MISCELLANEOUS) ×1 IMPLANT
GLIDEWIRE ANGLED SS 035X260CM (WIRE) ×1 IMPLANT
GUIDEWIRE VERSACORE 260 (WIRE) ×1 IMPLANT
KIT ENCORE 26 ADVANTAGE (KITS) ×1 IMPLANT
NDL ENTRY 21GA 7CM ECHOTIP (NEEDLE) IMPLANT
NEEDLE ENTRY 21GA 7CM ECHOTIP (NEEDLE) ×2 IMPLANT
PACK ANGIOGRAPHY (CUSTOM PROCEDURE TRAY) ×1 IMPLANT
SET INTRO CAPELLA COAXIAL (SET/KITS/TRAYS/PACK) ×1 IMPLANT
SHEATH BRITE TIP 5FRX11 (SHEATH) ×1 IMPLANT
SHEATH PINNACLE ST 7F 65CM (SHEATH) ×1 IMPLANT
STENT LIFESTREAM 8X37X80 (Permanent Stent) ×1 IMPLANT
SYR MEDRAD MARK 7 150ML (SYRINGE) ×1 IMPLANT
WIRE GUIDERIGHT .035X150 (WIRE) ×1 IMPLANT

## 2021-12-17 NOTE — Op Note (Signed)
[facility]  Percutaneous Study/Intervention Procedural Note   Date: 04/01/2016  Surgeon(s): [name], MD  Assistants: none  Pre-operative Diagnosis: 1.  Subclavian Steal Syndrome 2. Left Subclavian stenosis >90%   Post-operative diagnosis:  Same  Procedure(s) Performed:             1.  Ultrasound guidance for vascular access right femoral artery femoral artery             2.  Catheter placement into left brachial artery from right femoral approach             3.  Aortogram and selective angiogram of the left arm third order catheter placement  4.  Percutaneous transluminal angioplasty of the left subclavian with a 5 mm diameter x 40 mm length Lutonix angioplasty balloon             5.  Stent to the left subclavian with 8 mm diameter x 36 mm length balloon expandable stent, a Lifestream with post dilation using an 9 mm balloon             6.  StarClose closure device right femoral artery   Anesthesia: Conscious sedation was administered under my direct supervision by the interventional radiology RN.  IV Versed plus fentanyl were utilized. Continuous ECG, pulse oximetry and blood pressure was monitored throughout the entire procedure.  Versed and fentanyl were administered intravenously.  Conscious sedation was administered for a total of 37 minutes and 35 seconds.   Contrast: 75 cc  Fluoro time: 4.5 minutes             Indications:  Patient is a 76y.o. male who has symptoms consistent with subclavian steal syndrome and increasing syncope. The patient has a duplex ultrasound showing reversal of flow in the left vertebral artery consistent with subclavian steal.  He has a large discrepancy in his blood pressures between the right arm and the left arm with the left arm obviously being significantly lower.  He also notes he has begun having syncopal episodes. The patient is brought to special procedures for angiography for further evaluation and potential  treatment. Risks and benefits are discussed and informed consent is obtained  Procedure:  The patient was identified and appropriate procedural time out was performed.  The patient was then placed supine on the table and prepped and draped in the usual sterile fashion.  Ultrasound was used to evaluate the right common femoral artery.  It was patent .  A digital ultrasound image was acquired.  A micropuncture needle was used to access the right common femoral artery under direct ultrasound guidance and a permanent image was performed.  A microwire was then advanced easily under fluoroscopic guidance and a micro-sheath inserted. A 0.035 J wire was advanced without resistance and a 5Fr sheath was placed.  Pigtail catheter was placed into the ascending aorta and an LAO projection of the arch was performed. This demonstrated a high-grade stenosis just distal to the origin of the left subclavian. The origins of the innominate and left carotid were widely patent and arch was otherwise normal.   The patient was given 7000 units of IV heparin. We upsized to a 7 Fr sheath 65 cm destination.  A Kumpe catheter was used to selectively cannulate the subclavian and the catheter was advanced initially to the mid subclavian where hand injection contrast was used to demonstrate the distal subclavian and axillary arteries and then the catheter was advanced over the wire into the  brachial artery were distal runoff was complete.    With the versa core wire positioned in the distal brachial artery and the sheath tip positioned at the ostia of the left subclavian magnified imaging of the lesion were then obtained by hand injection through the sheath. I used a 5 mm diameter x 40 mm length Lutonix angioplasty balloon and inflated the balloon to 10 atm for one minute.  Completion angiogram demonstrated moderate improvement but the area was still undersized, so I elected to proceed with stent placement. I then used a 8 mm diameter x 36  mm length Lifestream balloon expandable stent. I inflated the balloon to 10 atm. I then advanced an 9 x 40 Lutonix balloon into the stent and inflated this to 8 atm for approximately 30 seconds to flare the ostial margin.    On completion angiogram following this, less than 5% residual stenosis was identified. At this point, I elected to terminate the procedure. The diagnostic catheter was removed. StarClose closure device was deployed in usual fashion with excellent hemostatic result. The patient was taken to the recovery room in stable condition having tolerated the procedure well.     Findings: Aortic arch demonstrates a type I arch. No evidence of ulcerations or other abnormalities of the arch. Great vessels are widely patent in their origins and visualized segments with the exception of the left subclavian which demonstrates a greater than 90% stenosis approximately 5 mm distal to its origin. The mid and distal subclavian and axillary arteries are widely patent.  The brachial artery is widely patent as well.   Following angioplasty and stent placement there is now less than 5% residual stenosis. There is now forward flow noted in the left vertebral.  Disposition: Patient is status post successful intervention left subclavian. Patient was taken to the recovery room in stable condition having tolerated the procedure well.  Complications:  None  [name] 04/01/2016 10:45 AM  This note was created with Dragon Medical transcription system. Any errors in dictation are purely unintentional.

## 2021-12-17 NOTE — Interval H&P Note (Signed)
History and Physical Interval Note:  12/17/2021 10:05 AM  Jon [name]  has presented today for surgery, with the diagnosis of L subclavian stent placement   BARD   Subclavian artery stenosis.  The various methods of treatment have been discussed with the patient and family. After consideration of risks, benefits and other options for treatment, the patient has consented to  Procedure(s): Upper Extremity Angiography (Left) as a surgical intervention.  The patient's history has been reviewed, patient examined, no change in status, stable for surgery.  I have reviewed the patient's chart and labs.  Questions were answered to the patient's satisfaction.     [name]

## 2021-12-18 ENCOUNTER — Encounter: Payer: Self-pay | Admitting: Vascular Surgery

## 2021-12-19 ENCOUNTER — Other Ambulatory Visit: Payer: Medicare Other

## 2021-12-20 ENCOUNTER — Ambulatory Visit (INDEPENDENT_AMBULATORY_CARE_PROVIDER_SITE_OTHER): Payer: Medicare Other | Admitting: Medical

## 2021-12-20 ENCOUNTER — Encounter: Payer: Self-pay | Admitting: Medical

## 2021-12-20 VITALS — BP 110/60 | HR 80 | Ht 73.0 in | Wt 202.4 lb

## 2021-12-20 DIAGNOSIS — R42 Dizziness and giddiness: Secondary | ICD-10-CM

## 2021-12-20 DIAGNOSIS — R931 Abnormal findings on diagnostic imaging of heart and coronary circulation: Secondary | ICD-10-CM

## 2021-12-20 DIAGNOSIS — R55 Syncope and collapse: Secondary | ICD-10-CM | POA: Diagnosis not present

## 2021-12-20 DIAGNOSIS — E782 Mixed hyperlipidemia: Secondary | ICD-10-CM | POA: Diagnosis not present

## 2021-12-20 DIAGNOSIS — Z8673 Personal history of transient ischemic attack (TIA), and cerebral infarction without residual deficits: Secondary | ICD-10-CM | POA: Diagnosis not present

## 2021-12-20 DIAGNOSIS — I771 Stricture of artery: Secondary | ICD-10-CM | POA: Diagnosis not present

## 2021-12-20 DIAGNOSIS — I2510 Atherosclerotic heart disease of native coronary artery without angina pectoris: Secondary | ICD-10-CM

## 2021-12-20 DIAGNOSIS — G459 Transient cerebral ischemic attack, unspecified: Secondary | ICD-10-CM

## 2021-12-20 MED ORDER — NITROGLYCERIN 0.4 MG SL SUBL: 0.4000 mg | SUBLINGUAL_TABLET | SUBLINGUAL | 3 refills | Status: DC | PRN

## 2021-12-20 NOTE — Progress Notes (Signed)
Cardiology Office Note:    Date:  12/20/2021   ID:  Jon [name], DOB January 08, 1946, MRN [id number]  PCP:  Jon Marry, MD  [facility] Cardiologist:  Jon Hew, MD  [place] Electrophysiologist:  None   Referring MD: Jon [name], *   Chief Complaint: follow-up cardiac CTA  History of Present Illness:    Jon [name] is a 76 y.o. male with a hx of HLD, TIA, HTN, postural dizziness, subclavian artery stenosis, CKD stage 3, and CAD who presents for follow-up for cardiac CTA.   Seen 11/14/21 for DOE, atypical chest pain, TIA, and left arm numbness. Cardiac CTA, heart monitor, 2D echo with bubble and carotid flow subclavian CTA were ordered. CTA neck was ordered for subclavian artery stenosis. Will likely need to see VVS and neurology.    Cardiac CTA showed coronary calcium score of 562, 67th percentile, >70% stenosis in the pRCA and mLAD, cardiac cath was recommended.    CTA neck showed multifocal severe stenosis of the V2 vertebral artery, severe stenosis of the proximal left subclavian artery, bilateral carotid bifurcation ICA atherosclerosis without greater than 50% stenosis.    Last seen 11/27/21 and cardiac cath was recommended, however the patient wanted to see vascular surgery before preceding with cardiac cath.   He saw vascular surgery 12/02/21 and underwent stenting of the left subclavian artery 12/17/21.  Today, the patient reports he is still tired from Tuesday. He wants to wait on setting himself up for a cardiac cath. He was unable to sleep due to the pain from the groin. He denies chest pain or SOB. NO LLE, orthopnea or pnd. He denies further dizziness. Heart monitor was reviewed. Patient wants to wait 30 days before pursing a left heart catheterization.   Past Medical History:  Diagnosis Date   BENIGN POSITIONAL VERTIGO 05/20/2010   Qualifier: Diagnosis of  By: Jon Fireman MD, Catherine     Diabetes mellitus    Epididymitis     Hyperlipemia    Hypertension    Stroke [facility]) 7/11    Past Surgical History:  Procedure Laterality Date   PILONIDAL CYST EXCISION  1960, 61, 62, 63   UPPER EXTREMITY ANGIOGRAPHY Left 12/17/2021   Procedure: Upper Extremity Angiography;  Surgeon: Jon Cabal, MD;  Location: [place] [facility];  Service: Cardiovascular;  Laterality: Left;   VASECTOMY      Current Medications: Current Meds  Medication Sig   Accu-Chek Softclix Lancets lancets Use to check blood sugars twice daily   aspirin EC 81 MG tablet Take 1 tablet (81 mg total) by mouth daily. Swallow whole.   atorvastatin (LIPITOR) 40 MG tablet TAKE 1 TABLET BY MOUTH  DAILY   Blood Glucose Monitoring Suppl (ACCU-CHEK GUIDE ME) w/Device KIT Please fill test Accu-chek Test strips only for patient. Patient states he does not need the meter or Lancets just test strips. Dx. Code E11.8   clopidogrel (PLAVIX) 75 MG tablet TAKE 1 TABLET BY MOUTH  DAILY   glucose blood (ACCU-CHEK GUIDE) test strip Dx DM E11.9. Check fasting blood sugar every morning.   Insulin Pen Needle (EASY TOUCH PEN NEEDLES) 31G X 8 MM MISC FOR USE WITH INJECTING  INSULIN DAILY   ketoconazole (NIZORAL) 2 % cream Apply topically daily.   LEVEMIR FLEXPEN 100 UNIT/ML FlexPen INJECT SUBCUTANEOUSLY 50 UNITS  DAILY (Patient taking differently: 25 Units daily.)   nitroGLYCERIN (NITROSTAT) 0.4 MG SL tablet Place 1 tablet (0.4 mg total) under the tongue every 5 (five)  minutes as needed for chest pain.   SYNJARDY 12.5-500 MG TABS TAKE 1 TABLET BY MOUTH  TWICE DAILY   traZODone (DESYREL) 50 MG tablet TAKE 1/2 TO 1 TABLET BY  MOUTH AT BEDTIME AS NEEDED  FOR SLEEP     Allergies:   Gabapentin, Lisinopril, and Metformin and related   Social History   Socioeconomic History   Marital status: Divorced    Spouse name: Not on file   Number of children: 2   Years of education: 12th grade   Highest education level: High school graduate  Occupational History   Occupation:  Retired  Tobacco Use   Smoking status: Every Day    Packs/day: 1.00    Years: 62.00    Total pack years: 62.00    Types: Cigarettes   Smokeless tobacco: Never  Substance and Sexual Activity   Alcohol use: No   Drug use: No   Sexual activity: Not on file  Other Topics Concern   Not on file  Social History Narrative   Lives alone and Likes to play golf 2 x 3 times a week.   Social Determinants of Health   Financial Resource Strain: Low Risk  (07/26/2021)   Overall Financial Resource Strain (CARDIA)    Difficulty of Paying Living Expenses: Not hard at all  Food Insecurity: No Food Insecurity (07/26/2021)   Hunger Vital Sign    Worried About Running Out of Food in the Last Year: Never true    Ran Out of Food in the Last Year: Never true  Transportation Needs: No Transportation Needs (07/26/2021)   PRAPARE - Hydrologist (Medical): No    Lack of Transportation (Non-Medical): No  Physical Activity: Insufficiently Active (07/26/2021)   Exercise Vital Sign    Days of Exercise per Week: 2 days    Minutes of Exercise per Session: 20 min  Stress: No Stress Concern Present (07/26/2021)   [place]    Feeling of Stress : Not at all  Social Connections: Socially Isolated (07/26/2021)   Social Connection and Isolation Panel [NHANES]    Frequency of Communication with Friends and Family: More than three times a week    Frequency of Social Gatherings with Friends and Family: Once a week    Attends Religious Services: Never    Marine scientist or Organizations: No    Attends Music therapist: Never    Marital Status: Divorced     Family History: The patient's family history includes Diabetes in an other family member. There is no history of Colon cancer.  ROS:   Please see the history of present illness.     All other systems reviewed and are negative.  EKGs/Labs/Other  Studies Reviewed:    The following studies were reviewed today:   Cardiac CTA 11/2021 IMPRESSION: 1. Coronary calcium score of 562. This was 67th percentile for age and sex matched control.   2. Normal coronary origin with right dominance.   3. Non calcified plaque causing severe stenosis (>70%) in the proximal RCA and mid LAD.   4. CAD-RADS 4 Severe stenosis. (70-99% or > 50% left main). Cardiac catheterization is recommended.   5. Consider symptom-guided anti-ischemic pharmacotherapy as well as risk factor modification per guideline directed care.   Electronically Signed: By: Kate Sable M.D. On: 11/18/2021 13:17   CTA neck 11/21/21   IMPRESSION: 1. Multifocal severe stenosis of the left V2 vertebral artery.  2. Severe stenosis of the proximal left subclavian artery. 3. Bilateral carotid bifurcation ICA atherosclerosis without greater than 50% stenosis. 4. Partially imaged left parotid tail mass/neoplasm, increased in size relative to CT neck from 2018.  EKG:  EKG is ordered today.  The ekg ordered today demonstrates NSR 80bpm, LAD, low voltage, inferior q waves  Recent Labs: 11/11/2021: ALT 18; Hemoglobin 14.8; Platelets 206; Potassium 4.5; Sodium 135 12/17/2021: BUN 16; Creatinine, Ser 1.46  Recent Lipid Panel    Component Value Date/Time   CHOL 145 11/22/2020 1420   TRIG 157 (H) 11/22/2020 1420   HDL 42 11/22/2020 1420   CHOLHDL 3.5 11/22/2020 1420   VLDL 39 (H) 12/25/2015 1119   LDLCALC 78 11/22/2020 1420    Physical Exam:    VS:  BP 110/60 (BP Location: Left Arm, Patient Position: Sitting, Cuff Size: Normal)   Pulse 80   Ht $R'6\' 1"'Ca$  (1.854 m)   Wt 202 lb 6 oz (91.8 kg)   SpO2 98%   BMI 26.70 kg/m     Wt Readings from Last 3 Encounters:  12/20/21 202 lb 6 oz (91.8 kg)  12/17/21 200 lb (90.7 kg)  12/02/21 201 lb (91.2 kg)     GEN:  Well nourished, well developed in no acute distress HEENT: Normal NECK: No JVD; No carotid bruits LYMPHATICS: No  lymphadenopathy CARDIAC: RRR, no murmurs, rubs, gallops RESPIRATORY:  Clear to auscultation without rales, wheezing or rhonchi  ABDOMEN: Soft, non-tender, non-distended MUSCULOSKELETAL:  No edema; No deformity  SKIN: Warm and dry NEUROLOGIC:  Alert and oriented x 3 PSYCHIATRIC:  Normal affect   ASSESSMENT:    1. Abnormal cardiac CT angiography   2. History of stroke   3. TIA (transient ischemic attack)   4. Coronary artery disease involving native coronary artery of native heart without angina pectoris   5. Hyperlipidemia, mixed   6. Postural dizziness with near syncope   7. Subclavian arterial stenosis (HCC)    PLAN:    In order of problems listed above:  CAD Abnormal Cardiac CTA Prior Cardiac CTA showed 70% or greater blockage in the RCA and LAD. Cardiac cath was recommended, however patient wanted to wait until his vascular procedure. He would like to wait 30 days before being set up for a cardiac cath. He denies chest pain. We will see him back in 30 days. Continue Aspirin, Lipitor, Plavix, lisinopril. I will give him SL NTG to use PRN for chest pain.   HLD LDL 78. Continue Lipitor $RemoveBeforeDE'80mg'olFFRqhJbSMzACT$  daily.   PVD Left subclavian stenosis s/p stent. Continue Aspirin, Plavix, and Lipitor. He will continue to follow with vascular surgery.   TIA H/o CVA ER visit 7/4 for TIA, he didn't want to be admitted. Echo showed normal LVEF, no WMA, G1DD. Heart monitor showed NSR with 3 runs of NSVT, longest 12 beats, and 1 run of SVT, no afib noted.   Postural dizziness No further dizziness since the stroke.    Disposition: Follow up in 1 month(s) with MD/APP    Signed, Nikolis Berent Ninfa Meeker, PA-C  12/20/2021 2:33 PM    [facility]

## 2021-12-20 NOTE — Patient Instructions (Signed)
Medication Instructions:  Your physician has recommended you make the following change in your medication:   An Rx for sublingual Nitro-Glycerin to be used as directed has been sent to your [facility].  *If you need a refill on your cardiac medications before your next appointment, please call your pharmacy*   Lab Work: None ordered If you have labs (blood work) drawn today and your tests are completely normal, you will receive your results only by: [place] (if you have MyChart) OR A paper copy in the mail If you have any lab test that is abnormal or we need to change your treatment, we will call you to review the results.   Testing/Procedures: None ordered   Follow-Up: At [facility], you and your health needs are our priority.  As part of our continuing mission to provide you with exceptional heart care, we have created designated Provider Care Teams.  These Care Teams include your primary Cardiologist (physician) and Advanced Practice Providers (APPs -  Physician Assistants and Nurse Practitioners) who all work together to provide you with the care you need, when you need it.  We recommend signing up for the patient portal called "MyChart".  Sign up information is provided on this After Visit Summary.  MyChart is used to connect with patients for Virtual Visits (Telemedicine).  Patients are able to view lab/test results, encounter notes, upcoming appointments, etc.  Non-urgent messages can be sent to your provider as well.   To learn more about what you can do with MyChart, go to [email or web address].    Your next appointment:   4 week(s)  The format for your next appointment:   In Person  Provider:   [name], MD    Other Instructions  Nitroglycerin Sublingual Tablets What is this medication? NITROGLYCERIN (nye troe GLI ser in) prevents and treats chest pain (angina). It works by relaxing blood vessels, which decreases the amount of work the heart  has to do. It belongs to a group of medications called nitrates. This medicine may be used for other purposes; ask your health care provider or pharmacist if you have questions. COMMON BRAND NAME(S): Nitroquick, Nitrostat, Nitrotab What should I tell my care team before I take this medication? They need to know if you have any of these conditions: Anemia Head injury, recent stroke, or bleeding in the brain Liver disease Previous heart attack An unusual or allergic reaction to nitroglycerin, other medications, foods, dyes, or preservatives Pregnant or trying to get pregnant Breast-feeding How should I use this medication? Take this medication by mouth as needed. Use at the first sign of an angina attack (chest pain or tightness). You can also take this medication 5 to 10 minutes before an event likely to produce chest pain. Follow the directions exactly as written on the prescription label. Place one tablet under your tongue and let it dissolve. Do not swallow whole. Replace the dose if you accidentally swallow it. It will help if your mouth is not dry. Saliva around the tablet will help it to dissolve more quickly. Do not eat or drink, smoke or chew tobacco while a tablet is dissolving. Sit down when taking this medication. In an angina attack, you should feel better within 5 minutes after your first dose. You can take a dose every 5 minutes up to a total of 3 doses. If you do not feel better or feel worse after 1 dose, call 9-1-1 at once. Do not take more than 3 doses in 15  minutes. Your care team might give you other directions. Follow those directions if they do. Do not take your medication more often than directed. Talk to your care team about the use of this medication in children. Special care may be needed. Overdosage: If you think you have taken too much of this medicine contact a poison control center or emergency room at once. NOTE: This medicine is only for you. Do not share this medicine  with others. What if I miss a dose? This does not apply. This medication is only used as needed. What may interact with this medication? Do not take this medication with any of the following: Certain migraine medications like ergotamine and dihydroergotamine (DHE) Medications used to treat erectile dysfunction like sildenafil, tadalafil, and vardenafil Riociguat This medication may also interact with the following: Alteplase Aspirin Heparin Medications for high blood pressure Medications for mental depression Other medications used to treat angina Phenothiazines like chlorpromazine, mesoridazine, prochlorperazine, thioridazine This list may not describe all possible interactions. Give your health care provider a list of all the medicines, herbs, non-prescription drugs, or dietary supplements you use. Also tell them if you smoke, drink alcohol, or use illegal drugs. Some items may interact with your medicine. What should I watch for while using this medication? Tell your care team if you feel your medication is no longer working. Keep this medication with you at all times. Sit or lie down when you take your medication to prevent falling if you feel dizzy or faint after using it. Try to remain calm. This will help you to feel better faster. If you feel dizzy, take several deep breaths and lie down with your feet propped up, or bend forward with your head resting between your knees. You may get drowsy or dizzy. Do not drive, use machinery, or do anything that needs mental alertness until you know how this medication affects you. Do not stand or sit up quickly, especially if you are an older patient. This reduces the risk of dizzy or fainting spells. Alcohol can make you more drowsy and dizzy. Avoid alcoholic drinks. Do not treat yourself for coughs, colds, or pain while you are taking this medication without asking your care team for advice. Some ingredients may increase your blood pressure. What  side effects may I notice from receiving this medication? Side effects that you should report to your care team as soon as possible: Allergic reactions--skin rash, itching, hives, swelling of the face, lips, tongue, or throat Headache, unusual weakness or fatigue, shortness of breath, nausea, vomiting, rapid heartbeat, blue skin or lips, which may be signs of methemoglobinemia Increased pressure around the brain--severe headache, blurry vision, change in vision, nausea, vomiting Low blood pressure--dizziness, feeling faint or lightheaded, blurry vision Slow heartbeat--dizziness, feeling faint or lightheaded, confusion, trouble breathing, unusual weakness or fatigue Worsening chest pain (angina)--pain, pressure, or tightness in the chest, neck, back, or arms Side effects that usually do not require medical attention (report to your care team if they continue or are bothersome): Dizziness Flushing Headache This list may not describe all possible side effects. Call your doctor for medical advice about side effects. You may report side effects to FDA at 1-800-FDA-1088. Where should I keep my medication? Keep out of the reach of children. Store at room temperature between 20 and 25 degrees C (68 and 77 degrees F). Store in [employer] of Staff. Protect from light and moisture. Keep tightly closed. Throw away any unused medication after the expiration date. NOTE: This sheet is  a summary. It may not cover all possible information. If you have questions about this medicine, talk to your doctor, pharmacist, or health care provider.  2023 Elsevier/Gold Standard (2007-06-19 00:00:00)

## 2021-12-20 NOTE — H&P (View-Only) (Signed)
Cardiology Office Note:    Date:  12/20/2021   ID:  Jon [name], DOB 11-02-1945, MRN [id number]  PCP:  [name], MD  [facility] Cardiologist:  [name], MD  [place] Electrophysiologist:  None   Referring MD: [name], *   Chief Complaint: follow-up cardiac CTA  History of Present Illness:    Jon [name] is a 76 y.o. male with a hx of HLD, TIA, HTN, postural dizziness, subclavian artery stenosis, CKD stage 3, and CAD who presents for follow-up for cardiac CTA.   Seen 11/14/21 for DOE, atypical chest pain, TIA, and left arm numbness. Cardiac CTA, heart monitor, 2D echo with bubble and carotid flow subclavian CTA were ordered. CTA neck was ordered for subclavian artery stenosis. Will likely need to see VVS and neurology.    Cardiac CTA showed coronary calcium score of 562, 67th percentile, >70% stenosis in the pRCA and mLAD, cardiac cath was recommended.    CTA neck showed multifocal severe stenosis of the V2 vertebral artery, severe stenosis of the proximal left subclavian artery, bilateral carotid bifurcation ICA atherosclerosis without greater than 50% stenosis.    Last seen 11/27/21 and cardiac cath was recommended, however the patient wanted to see vascular surgery before preceding with cardiac cath.   He saw vascular surgery 12/02/21 and underwent stenting of the left subclavian artery 12/17/21.  Today, the patient reports he is still tired from Tuesday. He wants to wait on setting himself up for a cardiac cath. He was unable to sleep due to the pain from the groin. He denies chest pain or SOB. NO LLE, orthopnea or pnd. He denies further dizziness. Heart monitor was reviewed. Patient wants to wait 30 days before pursing a left heart catheterization.   Past Medical History:  Diagnosis Date   BENIGN POSITIONAL VERTIGO 05/20/2010   Qualifier: Diagnosis of  By: Madilyn Fireman MD, Catherine     Diabetes mellitus    Epididymitis     Hyperlipemia    Hypertension    Stroke [facility]) 7/11    Past Surgical History:  Procedure Laterality Date   PILONIDAL CYST EXCISION  1960, 61, 62, 63   UPPER EXTREMITY ANGIOGRAPHY Left 12/17/2021   Procedure: Upper Extremity Angiography;  Surgeon: Katha Cabal, MD;  Location: [place] [facility];  Service: Cardiovascular;  Laterality: Left;   VASECTOMY      Current Medications: Current Meds  Medication Sig   Accu-Chek Softclix Lancets lancets Use to check blood sugars twice daily   aspirin EC 81 MG tablet Take 1 tablet (81 mg total) by mouth daily. Swallow whole.   atorvastatin (LIPITOR) 40 MG tablet TAKE 1 TABLET BY MOUTH  DAILY   Blood Glucose Monitoring Suppl (ACCU-CHEK GUIDE ME) w/Device KIT Please fill test Accu-chek Test strips only for patient. Patient states he does not need the meter or Lancets just test strips. Dx. Code E11.8   clopidogrel (PLAVIX) 75 MG tablet TAKE 1 TABLET BY MOUTH  DAILY   glucose blood (ACCU-CHEK GUIDE) test strip Dx DM E11.9. Check fasting blood sugar every morning.   Insulin Pen Needle (EASY TOUCH PEN NEEDLES) 31G X 8 MM MISC FOR USE WITH INJECTING  INSULIN DAILY   ketoconazole (NIZORAL) 2 % cream Apply topically daily.   LEVEMIR FLEXPEN 100 UNIT/ML FlexPen INJECT SUBCUTANEOUSLY 50 UNITS  DAILY (Patient taking differently: 25 Units daily.)   nitroGLYCERIN (NITROSTAT) 0.4 MG SL tablet Place 1 tablet (0.4 mg total) under the tongue every 5 (five)  minutes as needed for chest pain.   SYNJARDY 12.5-500 MG TABS TAKE 1 TABLET BY MOUTH  TWICE DAILY   traZODone (DESYREL) 50 MG tablet TAKE 1/2 TO 1 TABLET BY  MOUTH AT BEDTIME AS NEEDED  FOR SLEEP     Allergies:   Gabapentin, Lisinopril, and Metformin and related   Social History   Socioeconomic History   Marital status: Divorced    Spouse name: Not on file   Number of children: 2   Years of education: 12th grade   Highest education level: High school graduate  Occupational History   Occupation:  Retired  Tobacco Use   Smoking status: Every Day    Packs/day: 1.00    Years: 62.00    Total pack years: 62.00    Types: Cigarettes   Smokeless tobacco: Never  Substance and Sexual Activity   Alcohol use: No   Drug use: No   Sexual activity: Not on file  Other Topics Concern   Not on file  Social History Narrative   Lives alone and Likes to play golf 2 x 3 times a week.   Social Determinants of Health   Financial Resource Strain: Low Risk  (07/26/2021)   Overall Financial Resource Strain (CARDIA)    Difficulty of Paying Living Expenses: Not hard at all  Food Insecurity: No Food Insecurity (07/26/2021)   Hunger Vital Sign    Worried About Running Out of Food in the Last Year: Never true    Ran Out of Food in the Last Year: Never true  Transportation Needs: No Transportation Needs (07/26/2021)   PRAPARE - Hydrologist (Medical): No    Lack of Transportation (Non-Medical): No  Physical Activity: Insufficiently Active (07/26/2021)   Exercise Vital Sign    Days of Exercise per Week: 2 days    Minutes of Exercise per Session: 20 min  Stress: No Stress Concern Present (07/26/2021)   [place]    Feeling of Stress : Not at all  Social Connections: Socially Isolated (07/26/2021)   Social Connection and Isolation Panel [NHANES]    Frequency of Communication with Friends and Family: More than three times a week    Frequency of Social Gatherings with Friends and Family: Once a week    Attends Religious Services: Never    Marine scientist or Organizations: No    Attends Music therapist: Never    Marital Status: Divorced     Family History: The patient's family history includes Diabetes in an other family member. There is no history of Colon cancer.  ROS:   Please see the history of present illness.     All other systems reviewed and are negative.  EKGs/Labs/Other  Studies Reviewed:    The following studies were reviewed today:   Cardiac CTA 11/2021 IMPRESSION: 1. Coronary calcium score of 562. This was 67th percentile for age and sex matched control.   2. Normal coronary origin with right dominance.   3. Non calcified plaque causing severe stenosis (>70%) in the proximal RCA and mid LAD.   4. CAD-RADS 4 Severe stenosis. (70-99% or > 50% left main). Cardiac catheterization is recommended.   5. Consider symptom-guided anti-ischemic pharmacotherapy as well as risk factor modification per guideline directed care.   Electronically Signed: By: Kate Sable M.D. On: 11/18/2021 13:17   CTA neck 11/21/21   IMPRESSION: 1. Multifocal severe stenosis of the left V2 vertebral artery.  2. Severe stenosis of the proximal left subclavian artery. 3. Bilateral carotid bifurcation ICA atherosclerosis without greater than 50% stenosis. 4. Partially imaged left parotid tail mass/neoplasm, increased in size relative to CT neck from 2018.  EKG:  EKG is ordered today.  The ekg ordered today demonstrates NSR 80bpm, LAD, low voltage, inferior q waves  Recent Labs: 11/11/2021: ALT 18; Hemoglobin 14.8; Platelets 206; Potassium 4.5; Sodium 135 12/17/2021: BUN 16; Creatinine, Ser 1.46  Recent Lipid Panel    Component Value Date/Time   CHOL 145 11/22/2020 1420   TRIG 157 (H) 11/22/2020 1420   HDL 42 11/22/2020 1420   CHOLHDL 3.5 11/22/2020 1420   VLDL 39 (H) 12/25/2015 1119   LDLCALC 78 11/22/2020 1420    Physical Exam:    VS:  BP 110/60 (BP Location: Left Arm, Patient Position: Sitting, Cuff Size: Normal)   Pulse 80   Ht $R'6\' 1"'Bm$  (1.854 m)   Wt 202 lb 6 oz (91.8 kg)   SpO2 98%   BMI 26.70 kg/m     Wt Readings from Last 3 Encounters:  12/20/21 202 lb 6 oz (91.8 kg)  12/17/21 200 lb (90.7 kg)  12/02/21 201 lb (91.2 kg)     GEN:  Well nourished, well developed in no acute distress HEENT: Normal NECK: No JVD; No carotid bruits LYMPHATICS: No  lymphadenopathy CARDIAC: RRR, no murmurs, rubs, gallops RESPIRATORY:  Clear to auscultation without rales, wheezing or rhonchi  ABDOMEN: Soft, non-tender, non-distended MUSCULOSKELETAL:  No edema; No deformity  SKIN: Warm and dry NEUROLOGIC:  Alert and oriented x 3 PSYCHIATRIC:  Normal affect   ASSESSMENT:    1. Abnormal cardiac CT angiography   2. History of stroke   3. TIA (transient ischemic attack)   4. Coronary artery disease involving native coronary artery of native heart without angina pectoris   5. Hyperlipidemia, mixed   6. Postural dizziness with near syncope   7. Subclavian arterial stenosis (HCC)    PLAN:    In order of problems listed above:  CAD Abnormal Cardiac CTA Prior Cardiac CTA showed 70% or greater blockage in the RCA and LAD. Cardiac cath was recommended, however patient wanted to wait until his vascular procedure. He would like to wait 30 days before being set up for a cardiac cath. He denies chest pain. We will see him back in 30 days. Continue Aspirin, Lipitor, Plavix, lisinopril. I will give him SL NTG to use PRN for chest pain.   HLD LDL 78. Continue Lipitor $RemoveBeforeDE'80mg'DPwbFWJzTDvWfJl$  daily.   PVD Left subclavian stenosis s/p stent. Continue Aspirin, Plavix, and Lipitor. He will continue to follow with vascular surgery.   TIA H/o CVA ER visit 7/4 for TIA, he didn't want to be admitted. Echo showed normal LVEF, no WMA, G1DD. Heart monitor showed NSR with 3 runs of NSVT, longest 12 beats, and 1 run of SVT, no afib noted.   Postural dizziness No further dizziness since the stroke.    Disposition: Follow up in 1 month(s) with MD/APP    Signed, Yutaka Holberg Ninfa Meeker, PA-C  12/20/2021 2:33 PM    [facility]

## 2021-12-23 ENCOUNTER — Telehealth: Payer: Self-pay | Admitting: Cardiology

## 2021-12-23 NOTE — Telephone Encounter (Signed)
LVM to reschedule.

## 2021-12-25 NOTE — Addendum Note (Signed)
Addended by: Britt Bottom on: 12/25/2021 10:11 AM   Modules accepted: Orders

## 2021-12-30 ENCOUNTER — Telehealth: Payer: Self-pay | Admitting: Cardiology

## 2021-12-30 DIAGNOSIS — R931 Abnormal findings on diagnostic imaging of heart and coronary circulation: Secondary | ICD-10-CM

## 2021-12-30 DIAGNOSIS — Z01812 Encounter for preprocedural laboratory examination: Secondary | ICD-10-CM

## 2021-12-30 DIAGNOSIS — I2510 Atherosclerotic heart disease of native coronary artery without angina pectoris: Secondary | ICD-10-CM

## 2021-12-30 NOTE — Telephone Encounter (Signed)
Patient is retuning call. He states the call was regarding scheduling a procedure with Dr. Ellyn Hack.

## 2021-12-30 NOTE — Telephone Encounter (Signed)
I spoke with the patient again as he called back to advise he would like to go ahead and have heart cath on 01/02/22 with Dr. Ellyn Hack.   I have called over to the hospital to schedule this.  The patient is aware he will need to arrive at 10:30 am for an 11:30 am procedure on 01/02/22 at the [facility].  He is also aware that he will need lab work between now and Wednesday 01/01/22 (BMP/ CBC) again at the [facility].  I have confirmed with the patient that I will forward a copy of all of his pre-procedure instructions to his MyChart today.  The patient voices understanding and is agreeable.

## 2021-12-30 NOTE — Telephone Encounter (Signed)
I spoke with the patient. He called to advise that he is ready to go ahead and schedule his cardiac cath with Dr. Ellyn Hack.   He wants the cath in [place].  He also had several questions in regards to the procedure. All questions were answered to the best of our ability.   I advised the patient we could schedule his cath as soon as 01/02/22, but he prefers to wait until 01/09/22 as to arrange for someone to be with him after the procedure.  I advised the patient I will schedule his cath for 01/09/22 and follow up with him in regards to his pre-procedure instructions.  The patient voices understanding and is agreeable.  I will forward to Dr. Ellyn Hack and Cadence, PA as an FYI that the patient is ready to proceed with his cath.

## 2021-12-30 NOTE — Telephone Encounter (Signed)
Patient states he was just talking to a nurse. Called back with more questions. Please advise

## 2021-12-30 NOTE — Telephone Encounter (Signed)
See 2nd call that came in this morning for the same thing for documentation.

## 2021-12-31 ENCOUNTER — Other Ambulatory Visit
Admission: RE | Admit: 2021-12-31 | Discharge: 2021-12-31 | Disposition: A | Discharge status: Home / Self Care | Payer: Medicare Other | Source: Ambulatory Visit | Attending: Cardiology | Admitting: Cardiology

## 2021-12-31 DIAGNOSIS — R931 Abnormal findings on diagnostic imaging of heart and coronary circulation: Secondary | ICD-10-CM | POA: Diagnosis not present

## 2021-12-31 DIAGNOSIS — I2510 Atherosclerotic heart disease of native coronary artery without angina pectoris: Secondary | ICD-10-CM | POA: Insufficient documentation

## 2021-12-31 DIAGNOSIS — Z01812 Encounter for preprocedural laboratory examination: Secondary | ICD-10-CM | POA: Insufficient documentation

## 2021-12-31 LAB — CBC
HCT: 47.1 % (ref 39.0–52.0)
Hemoglobin: 15.5 g/dL (ref 13.0–17.0)
MCH: 31.4 pg (ref 26.0–34.0)
MCHC: 32.9 g/dL (ref 30.0–36.0)
MCV: 95.3 fL (ref 80.0–100.0)
Platelets: 209 10*3/uL (ref 150–400)
RBC: 4.94 MIL/uL (ref 4.22–5.81)
RDW: 12.3 % (ref 11.5–15.5)
WBC: 4.5 10*3/uL (ref 4.0–10.5)
nRBC: 0 % (ref 0.0–0.2)

## 2021-12-31 LAB — BASIC METABOLIC PANEL
Anion gap: 8 (ref 5–15)
BUN: 15 mg/dL (ref 8–23)
CO2: 26 mmol/L (ref 22–32)
Calcium: 9.5 mg/dL (ref 8.9–10.3)
Chloride: 104 mmol/L (ref 98–111)
Creatinine, Ser: 1.47 mg/dL — ABNORMAL HIGH (ref 0.61–1.24)
GFR, Estimated: 49 mL/min — ABNORMAL LOW (ref >60)
Glucose, Bld: 159 mg/dL — ABNORMAL HIGH (ref 70–99)
Potassium: 5.1 mmol/L (ref 3.5–5.1)
Sodium: 138 mmol/L (ref 135–145)

## 2022-01-01 ENCOUNTER — Other Ambulatory Visit: Payer: Self-pay | Admitting: Medical

## 2022-01-01 ENCOUNTER — Other Ambulatory Visit: Payer: Self-pay

## 2022-01-01 DIAGNOSIS — R051 Acute cough: Secondary | ICD-10-CM

## 2022-01-01 DIAGNOSIS — E118 Type 2 diabetes mellitus with unspecified complications: Secondary | ICD-10-CM

## 2022-01-01 DIAGNOSIS — J441 Chronic obstructive pulmonary disease with (acute) exacerbation: Secondary | ICD-10-CM

## 2022-01-01 MED ORDER — ACCU-CHEK GUIDE VI STRP: ORAL_STRIP | 99 refills | Status: AC

## 2022-01-01 MED ORDER — ACCU-CHEK GUIDE ME W/DEVICE KIT: PACK | 3 refills | Status: AC

## 2022-01-01 MED ORDER — ACCU-CHEK SOFTCLIX LANCETS MISC: 3 refills | Status: AC

## 2022-01-01 MED ORDER — SODIUM CHLORIDE 0.9% FLUSH: 3.0000 mL | Freq: Two times a day (BID) | INTRAVENOUS | Status: DC

## 2022-01-01 NOTE — Telephone Encounter (Signed)
Spoke with Cadence Furth, [place]- she is aware the patient is scheduled for his left  heart cath on 8/24 with Dr. Ellyn Hack in [place].   She has placed his cath orders and reviewed his labs- advised kidneys are stable- no recommendations for pre-hydration.

## 2022-01-02 ENCOUNTER — Encounter: Payer: Self-pay | Admitting: Cardiology

## 2022-01-02 ENCOUNTER — Ambulatory Visit
Admission: RE | Admit: 2022-01-02 | Discharge: 2022-01-02 | Disposition: A | Discharge status: Home / Self Care | Payer: Medicare Other | Attending: Cardiology | Admitting: Cardiology

## 2022-01-02 ENCOUNTER — Encounter
Admission: RE | Disposition: A | Discharge status: Home / Self Care | Payer: Self-pay | Source: Home / Self Care | Attending: Cardiology

## 2022-01-02 DIAGNOSIS — E785 Hyperlipidemia, unspecified: Secondary | ICD-10-CM | POA: Diagnosis not present

## 2022-01-02 DIAGNOSIS — N183 Chronic kidney disease, stage 3 unspecified: Secondary | ICD-10-CM | POA: Insufficient documentation

## 2022-01-02 DIAGNOSIS — I771 Stricture of artery: Secondary | ICD-10-CM | POA: Insufficient documentation

## 2022-01-02 DIAGNOSIS — I25118 Atherosclerotic heart disease of native coronary artery with other forms of angina pectoris: Secondary | ICD-10-CM | POA: Diagnosis not present

## 2022-01-02 DIAGNOSIS — Z7982 Long term (current) use of aspirin: Secondary | ICD-10-CM | POA: Insufficient documentation

## 2022-01-02 DIAGNOSIS — I2584 Coronary atherosclerosis due to calcified coronary lesion: Secondary | ICD-10-CM | POA: Diagnosis not present

## 2022-01-02 DIAGNOSIS — Z7902 Long term (current) use of antithrombotics/antiplatelets: Secondary | ICD-10-CM | POA: Diagnosis not present

## 2022-01-02 DIAGNOSIS — I129 Hypertensive chronic kidney disease with stage 1 through stage 4 chronic kidney disease, or unspecified chronic kidney disease: Secondary | ICD-10-CM | POA: Insufficient documentation

## 2022-01-02 DIAGNOSIS — R42 Dizziness and giddiness: Secondary | ICD-10-CM | POA: Diagnosis not present

## 2022-01-02 DIAGNOSIS — Z8673 Personal history of transient ischemic attack (TIA), and cerebral infarction without residual deficits: Secondary | ICD-10-CM | POA: Diagnosis not present

## 2022-01-02 DIAGNOSIS — R931 Abnormal findings on diagnostic imaging of heart and coronary circulation: Secondary | ICD-10-CM

## 2022-01-02 DIAGNOSIS — R0609 Other forms of dyspnea: Secondary | ICD-10-CM | POA: Diagnosis not present

## 2022-01-02 DIAGNOSIS — I2510 Atherosclerotic heart disease of native coronary artery without angina pectoris: Secondary | ICD-10-CM | POA: Diagnosis not present

## 2022-01-02 DIAGNOSIS — F17210 Nicotine dependence, cigarettes, uncomplicated: Secondary | ICD-10-CM | POA: Insufficient documentation

## 2022-01-02 DIAGNOSIS — Z79899 Other long term (current) drug therapy: Secondary | ICD-10-CM | POA: Insufficient documentation

## 2022-01-02 HISTORY — PX: LEFT HEART CATH AND CORONARY ANGIOGRAPHY: CATH118249

## 2022-01-02 LAB — GLUCOSE, CAPILLARY: Glucose-Capillary: 131 mg/dL — ABNORMAL HIGH (ref 70–99)

## 2022-01-02 SURGERY — LEFT HEART CATH AND CORONARY ANGIOGRAPHY
Anesthesia: Moderate Sedation

## 2022-01-02 MED ORDER — IOHEXOL 300 MG/ML  SOLN
INTRAMUSCULAR | Status: DC | PRN
  Administered 2022-01-02: 35 mL

## 2022-01-02 MED ORDER — HEPARIN SODIUM (PORCINE) 1000 UNIT/ML IJ SOLN
INTRAMUSCULAR | Status: AC
  Filled 2022-01-02: qty 10

## 2022-01-02 MED ORDER — LIDOCAINE HCL 1 % IJ SOLN
INTRAMUSCULAR | Status: AC
  Filled 2022-01-02: qty 20

## 2022-01-02 MED ORDER — MIDAZOLAM HCL 2 MG/2ML IJ SOLN
INTRAMUSCULAR | Status: DC | PRN
  Administered 2022-01-02: 1 mg via INTRAVENOUS

## 2022-01-02 MED ORDER — LABETALOL HCL 5 MG/ML IV SOLN: 10.0000 mg | INTRAVENOUS | Status: DC | PRN

## 2022-01-02 MED ORDER — FENTANYL CITRATE (PF) 100 MCG/2ML IJ SOLN
INTRAMUSCULAR | Status: DC | PRN
  Administered 2022-01-02: 25 ug via INTRAVENOUS

## 2022-01-02 MED ORDER — SODIUM CHLORIDE 0.9% FLUSH: 3.0000 mL | INTRAVENOUS | Status: DC | PRN

## 2022-01-02 MED ORDER — VERAPAMIL HCL 2.5 MG/ML IV SOLN
INTRAVENOUS | Status: AC
  Filled 2022-01-02: qty 2

## 2022-01-02 MED ORDER — HYDRALAZINE HCL 20 MG/ML IJ SOLN: 10.0000 mg | INTRAMUSCULAR | Status: DC | PRN

## 2022-01-02 MED ORDER — SODIUM CHLORIDE 0.9 % WEIGHT BASED INFUSION
3.0000 mL/kg/h | INTRAVENOUS | Status: AC
  Administered 2022-01-02: 3 mL/kg/h via INTRAVENOUS

## 2022-01-02 MED ORDER — SODIUM CHLORIDE 0.9 % IV SOLN: 250.0000 mL | INTRAVENOUS | Status: DC | PRN

## 2022-01-02 MED ORDER — VERAPAMIL HCL 2.5 MG/ML IV SOLN
INTRAVENOUS | Status: DC | PRN
  Administered 2022-01-02: 2.5 mg via INTRA_ARTERIAL

## 2022-01-02 MED ORDER — MIDAZOLAM HCL 2 MG/2ML IJ SOLN
INTRAMUSCULAR | Status: AC
  Filled 2022-01-02: qty 2

## 2022-01-02 MED ORDER — ASPIRIN 81 MG PO CHEW: 81.0000 mg | CHEWABLE_TABLET | ORAL | Status: DC

## 2022-01-02 MED ORDER — ONDANSETRON HCL 4 MG/2ML IJ SOLN: 4.0000 mg | Freq: Four times a day (QID) | INTRAMUSCULAR | Status: DC | PRN

## 2022-01-02 MED ORDER — HEPARIN (PORCINE) IN NACL 1000-0.9 UT/500ML-% IV SOLN
INTRAVENOUS | Status: AC
  Filled 2022-01-02: qty 500

## 2022-01-02 MED ORDER — SODIUM CHLORIDE 0.9 % WEIGHT BASED INFUSION: 1.0000 mL/kg/h | INTRAVENOUS | Status: DC

## 2022-01-02 MED ORDER — LIDOCAINE HCL (PF) 1 % IJ SOLN
INTRAMUSCULAR | Status: DC | PRN
  Administered 2022-01-02: 2 mL

## 2022-01-02 MED ORDER — FENTANYL CITRATE (PF) 100 MCG/2ML IJ SOLN
INTRAMUSCULAR | Status: AC
  Filled 2022-01-02: qty 2

## 2022-01-02 MED ORDER — ACETAMINOPHEN 325 MG PO TABS: 650.0000 mg | ORAL_TABLET | ORAL | Status: DC | PRN

## 2022-01-02 MED ORDER — HEPARIN SODIUM (PORCINE) 1000 UNIT/ML IJ SOLN
INTRAMUSCULAR | Status: DC | PRN
  Administered 2022-01-02: 5000 [IU] via INTRAVENOUS

## 2022-01-02 MED ORDER — SODIUM CHLORIDE 0.9% FLUSH: 3.0000 mL | Freq: Two times a day (BID) | INTRAVENOUS | Status: DC

## 2022-01-02 SURGICAL SUPPLY — 11 items
BAND ZEPHYR COMPRESS 30 LONG (HEMOSTASIS) IMPLANT
CATH 5F 110X4 TIG (CATHETERS) IMPLANT
CATH INFINITI 5FR ANG PIGTAIL (CATHETERS) IMPLANT
DRAPE BRACHIAL (DRAPES) IMPLANT
GLIDESHEATH SLEND SS 6F .021 (SHEATH) IMPLANT
GUIDEWIRE INQWIRE 1.5J.035X260 (WIRE) IMPLANT
INQWIRE 1.5J .035X260CM (WIRE) ×1
PACK CARDIAC CATH (CUSTOM PROCEDURE TRAY) ×1 IMPLANT
PROTECTION STATION PRESSURIZED (MISCELLANEOUS) ×1
SET ATX SIMPLICITY (MISCELLANEOUS) IMPLANT
STATION PROTECTION PRESSURIZED (MISCELLANEOUS) IMPLANT

## 2022-01-02 NOTE — Brief Op Note (Addendum)
BRIEF CARDIAC CATH NOTE 01/02/2022  1:14 PM  PCP:  Agapito Games, MD     [facility] Cardiologist:  [name], MD   PROCEDURE:  Procedure(s): LEFT HEART CATH AND CORONARY ANGIOGRAPHY (N/A)  SURGEON:  Surgeon(s) and Role:    * Marykay Lex, MD - Primary  PATIENT:  Jon [name]  76 y.o. male evaluated with 2D echocardiogram and cardiac CTA for exertional dyspnea and atypical chest pain.  He was also subsequently evaluated with carotid and upper extremity Dopplers revealing vesical AV stenosis now status post left subclavian stent by Dr.Schnier.  Cardiac CTA showed elevated Coronary Calcium Score 562 with significant disease noted in both the RCA and LAD with potential left main equivalent noted.  He chose to undergo the peripheral vascular evaluation and left leg stenting prior to agreeing to go forth with cardiac catheterization.  Initially he wanted to wait a full month after his subclavian stent, but then chose to go ahead and proceed sooner.  He now presents for his planned catheterization.  PRE-OPERATIVE DIAGNOSIS:  LT Heart Cath   Abnormal cardiac CT  POST-OPERATIVE DIAGNOSIS: Multivessel CAD: Distal LM-ostial LAD eccentric 50% ([place] 1, 0, 1+. Proximal-mid LAD (eccentric) 80% involving D1 80% ([place] 0, 1, 1) -> distal mid LAD 90% involving 70% D2 ([place] 1, 1, 1) 80% proximal-mid RI 30% proximal LCx with 6% OM1 10% proximal RCA, calcified eccentric 80% mid RCA LVEDP 16 mmHg.   No AS  PROCEDURE PERFORMED Time Out: Verified patient identification, verified procedure, site/side was marked, verified correct patient position, special equipment/implants available, medications/allergies/relevent history reviewed, required imaging and test results available. Performed.  Access:  RIGHT radial Artery: 6 Fr sheath -- Seldinger technique using Micropuncture Kit -- Direct ultrasound guidance used.  Permanent image obtained and placed on chart. -- 10 mL radial  cocktail IA; 5000 Units IV Heparin.  Left Heart Catheterization: 5Fr TIG 4.0 catheter was advanced or exchanged over a J-wire under direct fluoroscopic guidance into the ascending aorta; the catheter was advanced across the aortic valve as well as engage both the left and right coronary arteries. * LV Hemodynamics (no LV Gram): TIG 4.0 catheter * Left & Right Coronary Artery Cineangiography: TIG 4.0 catheter   Review of initial angiography revealed: Multivessel disease  Preparations are made for outpatient reassessment to discuss options of medical management versus multivessel PCI versus CABG.  Upon completion of Angiogaphy, the catheter was removed completely out of the body over a wire, without complication.  Radial sheath removed in the Cardiac Catheterization lab with TR Band placed for hemostasis.  TR Band: 10 mL air; 12:40 PM  MEDICATIONS SQ Lidocaine 3 mL Radial Cocktail: 3 mg Verapmil in 10 mL NS Heparin: 5,000 Units Contrast: 35 mL  ANESTHESIA:   local and IV sedation  EBL:  <20 mL  COUNTS:  YES  DICTATION: .Note written in EPIC  PLAN OF CARE: Discharge to home after PACU -> multivessel CAD warrants CABG evaluation.  He will like time to think about it and discuss with his family to determine whether or not he would want to go through with CABG or PCI.  Right now it sounds like he is more interested in medical management. For now we will continue aspirin and Plavix until decision is made about whether or not he wants to go for CABG. Would likely need to titrate up his antilipid management did titrate for LDL<55 We will reevaluate blood pressures and symptoms and follow-up visit in roughly  2 weeks.  He has been on ACE inhibitor but is on the list but not taking, will need to determine reasons for not being on medications and then likely consider ARB and beta-blocker.  PATIENT DISPOSITION:  PACU - hemodynamically stable.      Leonie Man, MD, MS [name],  M.D., M.S. Interventional Cardiologist   [facility]   [address] [place], [place]  [id number] [phone]           Fax [phone]

## 2022-01-02 NOTE — Interval H&P Note (Signed)
History and Physical Interval Note:  01/02/2022 12:12 PM  Jon [name]  has presented today for surgery, with the diagnosis of LT Heart Cath   Abnormal cardiac CT.  The various methods of treatment have been discussed with the patient and family. After consideration of risks, benefits and other options for treatment, the patient has consented to  Procedure(s): LEFT HEART CATH AND CORONARY ANGIOGRAPHY (N/A) PERCUTANEOUS CORONARY INTERVENTION   as a surgical intervention.  The patient's history has been reviewed, patient examined, no change in status, stable for surgery.  I have reviewed the patient's chart and labs.  Questions were answered to the patient's satisfaction.    Cath Lab Visit (complete for each Cath Lab visit)  Clinical Evaluation Leading to the Procedure:   ACS: No.  Non-ACS:    Anginal Classification: CCS II  Anti-ischemic medical therapy: Minimal Therapy (1 class of medications)  Non-Invasive Test Results: High-risk stress test findings: cardiac mortality >3%/year  Prior CABG: No previous CABG     Jon [name]

## 2022-01-09 ENCOUNTER — Ambulatory Visit: Payer: Medicare Other | Admitting: Cardiology

## 2022-01-16 ENCOUNTER — Encounter: Payer: Self-pay | Admitting: Cardiology

## 2022-01-16 ENCOUNTER — Ambulatory Visit: Payer: Medicare Other | Attending: Cardiology | Admitting: Cardiology

## 2022-01-16 ENCOUNTER — Ambulatory Visit: Payer: Medicare Other | Admitting: Cardiology

## 2022-01-16 ENCOUNTER — Telehealth: Payer: Self-pay | Admitting: Cardiology

## 2022-01-16 VITALS — BP 130/78 | HR 85 | Ht 73.0 in | Wt 206.0 lb

## 2022-01-16 DIAGNOSIS — E1169 Type 2 diabetes mellitus with other specified complication: Secondary | ICD-10-CM

## 2022-01-16 DIAGNOSIS — K118 Other diseases of salivary glands: Secondary | ICD-10-CM

## 2022-01-16 DIAGNOSIS — I10 Essential (primary) hypertension: Secondary | ICD-10-CM

## 2022-01-16 DIAGNOSIS — R0602 Shortness of breath: Secondary | ICD-10-CM | POA: Diagnosis not present

## 2022-01-16 DIAGNOSIS — I25118 Atherosclerotic heart disease of native coronary artery with other forms of angina pectoris: Secondary | ICD-10-CM | POA: Diagnosis not present

## 2022-01-16 DIAGNOSIS — E785 Hyperlipidemia, unspecified: Secondary | ICD-10-CM | POA: Diagnosis not present

## 2022-01-16 DIAGNOSIS — R55 Syncope and collapse: Secondary | ICD-10-CM

## 2022-01-16 DIAGNOSIS — D692 Other nonthrombocytopenic purpura: Secondary | ICD-10-CM

## 2022-01-16 DIAGNOSIS — R42 Dizziness and giddiness: Secondary | ICD-10-CM | POA: Diagnosis not present

## 2022-01-16 DIAGNOSIS — Z8673 Personal history of transient ischemic attack (TIA), and cerebral infarction without residual deficits: Secondary | ICD-10-CM

## 2022-01-16 DIAGNOSIS — I771 Stricture of artery: Secondary | ICD-10-CM | POA: Diagnosis not present

## 2022-01-16 DIAGNOSIS — F17210 Nicotine dependence, cigarettes, uncomplicated: Secondary | ICD-10-CM | POA: Diagnosis not present

## 2022-01-16 DIAGNOSIS — I679 Cerebrovascular disease, unspecified: Secondary | ICD-10-CM | POA: Diagnosis not present

## 2022-01-16 MED ORDER — BISOPROLOL FUMARATE 5 MG PO TABS: 2.5000 mg | ORAL_TABLET | Freq: Every day | ORAL | 3 refills | Status: DC

## 2022-01-16 NOTE — Telephone Encounter (Signed)
Patient states he was returning call. Please advise ?

## 2022-01-16 NOTE — Telephone Encounter (Signed)
I called the patient and advised him that I had not tried to call him. I inquired if he had received a call from the surgeon's office as he his now scheduled to see Dr. Prescott Gum on 02/10/22. He was not aware of this appointment and could not write this down at the moment. I have advised him to go on his MyChart and he should be able to see this under his active appointments.  I also advised him that he is scheduled to see Dr. Ellyn Hack on 02/20/22 and that we need to push this out to November. He advised this was fine, but could not write anything down.  I will have scheduling reach out to him to set up a follow up with Dr. Ellyn Hack in November.   The patient voices understanding and is agreeable.

## 2022-01-16 NOTE — Patient Instructions (Addendum)
Medication Instructions:   Your physician has recommended you make the following change in your medication:   RESTART Lisinopril START bisoprolol - Take 1/2 tablet by mouth daily.    *If you need a refill on your cardiac medications before your next appointment, please call your pharmacy*   Lab Work:  None Ordered  If you have labs (blood work) drawn today and your tests are completely normal, you will receive your results only by: [place] (if you have MyChart) OR A paper copy in the mail If you have any lab test that is abnormal or we need to change your treatment, we will call you to review the results.   Testing/Procedures:  None Ordered  Follow-Up: At [facility], you and your health needs are our priority.  As part of our continuing mission to provide you with exceptional heart care, we have created designated Provider Care Teams.  These Care Teams include your primary Cardiologist (physician) and Advanced Practice Providers (APPs -  Physician Assistants and Nurse Practitioners) who all work together to provide you with the care you need, when you need it.  We recommend signing up for the patient portal called "MyChart".  Sign up information is provided on this After Visit Summary.  MyChart is used to connect with patients for Virtual Visits (Telemedicine).  Patients are able to view lab/test results, encounter notes, upcoming appointments, etc.  Non-urgent messages can be sent to your provider as well.   To learn more about what you can do with MyChart, go to [email or web address].    Your next appointment:   2 month(s)  The format for your next appointment:   In Person  Provider:   You may see [name], MD or one of the following Advanced Practice Providers on your designated Care Team:   Murray Hodgkins, NP Christell Faith, PA-C Cadence Kathlen Mody, PA-C Gerrie Nordmann, NP   You have been referred to Cardiothoracic surgery - they will call you to  schedule an appointment.   Important Information About Sugar

## 2022-01-16 NOTE — Progress Notes (Signed)
Primary Care Provider: [name], MD [place] cardiologist: [name], MD Electrophysiologist: None Vascular Surgeon: Dr. [name]  Clinic Note: Chief Complaint  Patient presents with   Hospitalization Follow-up    Post   Coronary Artery Disease    Multiple CAD noted on cath.  Here to discuss options. Major symptom is exertional dyspnea   PAD    S/p left subclavian stent   ===================================  ASSESSMENT/PLAN   Problem List Items Addressed This Visit       Cardiology Problems   Hyperlipidemia due to type 2 diabetes mellitus ([place]) (Chronic)    With the severity of vascular disease, especially having left main and LAD disease, now her target LDL should be less than 55.  Most recent lipids I saw had an LDL of 78 that was for the last year.  Due to have labs checked next week by PCP.  He remains on atorvastatin -> pending with notes, would likely consider either switching to rosuvastatin or simply proceeding to inclisiran to ensure better compliance.      Relevant Medications   bisoprolol (ZEBETA) 5 MG tablet   Cerebrovascular disease (Chronic)   Relevant Medications   bisoprolol (ZEBETA) 5 MG tablet   Coronary artery disease involving native coronary artery with angina pectoris (HCC) - Primary (Chronic)    Most notable symptom that he is having this profound exertional dyspnea.  This could easily be explained by long-term smoking and COPD, however having left main disease and severe LAD lesions that are clearly ischemic in nature are most certainly combining to play a role.  We spent quite a long time reviewing his cath films reviewing the images and discussing the lesions.  I explained the importance of left main disease and to bifurcation lesions in the LAD, indicating that this would be extensive PCI would not be a favorable option.  I also felt that simply medical management, while not subjecting him to surgery would probably  not prevent progression of disease since he does not stop smoking.  I indicated that he would likely develop progressive disease and therefore lead to progressively worsening cardiac function and potentially ischemic cardiomyopathy with heart failure.  We went round and round about this topic.  My recommendation is referral to CVTS and refer them to discuss the risk benefits alternatives and indications of CABG.  We did spend quite a bit of time talking about CABG.  He finally was agreeable to meet with the surgeons, but still seems to be fixated on medical therapy.  I explained to him that regardless of what we do, we will continue to push medical therapy but he does not have a lot of blood pressure room etc. to work with.  Plan: CVTS referral to discuss CABG Need to see lipid panel from PCP recheck next week.  Target LDL should be less than 55. Continue DAPT for now pending CABG.  We need to determine from Dr. [name] as to how long he needs to be on DAPT (although I suspect that we will be okay holding Plavix for CABG) Restart lisinopril and initiate low-dose beta-blocker with bisoprolol 2.5 mg daily. Smoking cessation counseling again provided. Continue to recommend aggressive diabetes management.  He is on Synjardy providing empagliflozin (SGLT2 inhibitor)-metformin along with insulin.      Relevant Medications   bisoprolol (ZEBETA) 5 MG tablet   Subclavian arterial stenosis (HCC) (Chronic)    His left arm symptoms and the neurological's symptoms associated subclavian steal are now  resolved after subclavian stent placement.  Thankfully there was a landing zone to place a stent proximal to the takeoff of the vertebral artery.  Currently on DAPT for the subclavian stent.  He has been on long-term Plavix and aspirin was added.  Would need to check with vascular surgery to determine timing of when Plavix can be held for potential CABG.      Relevant Medications   bisoprolol (ZEBETA) 5 MG  tablet   Essential hypertension (Chronic)   Relevant Medications   bisoprolol (ZEBETA) 5 MG tablet   Postural dizziness with near syncope (Chronic)    His blood pressure seems to be little better today.  I am still leery of being very aggressive with blood pressure management.  As a diabetic with multivessel CAD, he should be on beta-blocker and ACE inhibitor at a minimum.  Thankfully, his EF is still preserved.  Plan: Restart 2.5 mg lisinopril, and start bisoprolol 2.5 mg daily (beta-1 selective)      Relevant Medications   bisoprolol (ZEBETA) 5 MG tablet   Senile purpura (HCC) (Chronic)    Now on aspirin and Plavix.  He perseverated quite a bit about being on the combination of aspirin and Plavix going back to when he had a stroke and being told he did not need to be on both aspirin and Plavix.  Not understanding that the reason why he is now back on aspirin at that he has a stent in the subclavian artery and therefore is on DAPT. He has noted more bruising since being on both ASA and Plavix. The very fact that he does not like to be on dual antiplatelet therapy is he had again another indication that multivessel PCI including left main PCI is not a good option for him.      Relevant Medications   bisoprolol (ZEBETA) 5 MG tablet     Other   History of stroke (Chronic)   Continuous dependence on cigarette smoking (Chronic)   Shortness of breath on exertion (Chronic)    He still has exertional dyspnea but really has not been doing much of anything since I last saw him.  At that time he was noticing chest tightness, he did not mention that today although his daughter seem to indicate that there has been episodes where he has noticed that.  Multivessel CAD noted which clearly contribute to etiology along with his centrilobular emphysema.  Clinically, his EF is stable and diastolic function does not seem to be that bad although I suspect it probably could be worse with exertion.  We are  putting him back on afterload reduction with lisinopril and adding bisoprolol has been most likely beta-blocker.  Referral to CVTS for CABG evaluation. I suspect his emphysema will be considered as part of the risk for his surgery although it seems to be relatively controlled..      Parotid mass    See CT scan results-indicates that there may be increased size in mass when compared to 2018.  Will defer to PCP.       ===================================  HPI:    Jon [name] is a 76 y.o. male with a PMH below who presents today for post cath follow-up to discuss results.  Also discussed results of echocardiogram and monitor.   He is here today at the request of [name], *.  Notable PMH: Centrilobular Emphysema -long-term chronic smoker with no interest in quitting. CAD - See Cath report H/o CVA (& recent "TIA") S/p L  Subclavian A Stent for severe 90+% prox stenosis with Subclavian Steal Syndrome & L Arm Claudication.  "Controlled Insulin Dependent DM-2" HTN, HLD  I first saw [name] in consultation at the request of Dr. Madilyn Fireman following [facility] ER visit with symptoms concerning for TIA.  Noted slurred speech that prompted EMS call.  Was back to himself shortly thereafter.  However also noted significant exertional dyspnea as well as exertional dyspnea, bilateral leg weakness with walking and left arm claudication symptoms with pain and cold/numb sensation.  Symptoms concerning for subclavian steal.  MRI of the ER showed no acute infarct but multiple small vessel infarcts noted with signs of chronic ischemic microangiopathy. => BP was low, so ACE-I held due to Neuro Sx to allow for permissive HTN.  Coronary CTA along with Chest CTA-to evaluated TAA&Main branch Vessels, 2D Echo, Zio Patch monitor ordered Referral to Neurology & Vasc Sgx  He has been seen twice by Cadence Furth, PA during my absence of medication-initially on 11/27/2021 following results of  his CT scans indicating carotid and coronary disease.  He indicated that he wanted to follow through with vascular surgery evaluation first.  Did not want to discuss cardiac catheterization until after that was completed.  (My initial plan was to potentially have both procedures done in the same setting to avoid double procedures).  At that time he had not yet had his echocardiogram or Zio patch monitor completed.  He had not yet seen neurology.  He was then seen on August 11, following his subclavian stent placement.  He indicated that he was still tired from the vascular procedure and wanted to wait for home month prior to having his catheterization.  If still being troubled by the pain in his groin from the procedure.  Did not have any chest pain or dyspnea, but he had not been active.  No CHF symptoms of PND orthopnea or edema. ==> Shortly after this visit, he contacted the office and indicated that he was interested in proceeding with cardiac catheterization and therefore scheduled for catheterization on 01/02/2022.  Recent Hospitalizations:  7/3 - TIA as noted 12/17/2021 - Out Patient Thoracic AoGram with L SubClavian Angiogram & PTA-Stent placement in Ost-prox L Subclavian A. . 8/24 - Cardiac Cath   Reviewed  CV studies:    The following studies were reviewed today: (if available, images/films reviewed: From Epic Chart or Care Everywhere) TTE 09/14/2019: EF 60 to 65%.  GR 1 DD.  No RV MA.  Mild AOV sclerosis with no stenosis.  Normal MV.  Otherwise normal.  Coronary CTA 11/18/2021: Coronary Calcium Score 562.  Right dominant. >70% non-calcified plaque in Prox RCA & mid LaD. > 50% Left Main. = Cardiac Cath Recommended.  CT Angio Neck 11/19/2021: Severe prox L Subclavian A stenosis, Bilateral Carotid Bifurcation - <50% involving prox ICA.  Multifocal severe stenosis of the left V2 vertebral artery.  Right vertebral artery patent without significant stenosis. Partially imaged left parotid tail  mass/neoplasm, increased in size relative to CT neck from 2018.  Upper Ext Angio-Intervention 12/17/21 (Dr. [name]): High-grade stenosis just distal to the origin of the L-Subclavian A, normal Innom A & L Carotid A => Ost-Prox L SubCl A 8x37x80 Stent - dilated with 9 mm balloon.   Zio Patch Monitor: 11/14/21   Zio Patch Wear Time:  12 days and 14 hours (2023-07-13T19:56:30-0400 to 2023-07-26T10:46:39-0400)   Predominant Underlying Rhythm is Sinus Rhythm: HR range 47-98 bpm, AVG 64 bpm.   3 runs of Non-sustained  Ventricular Tachycardia (> 4 PVC beats): Fastest: 5 beats - HR range 125-162 bpm, Avg 145 bpm, 2.1 sec; Longest with Fastest Average-12 beats, 150-158 bpm, avg 155 bpm, 4.6 sec-   1 Atrial Run: 5 beats - max HR 125, Avg 120 bpm, * Sec   No Sustained Arrhythmias: Sustained Ventricular Tachycardia (VT), Supraventricular Tachycardia (SVT), Atrial Tachycardia (AT), Atrial Fibrillation (A-Fib), Atrial Flutter (A-Flutter)   Occasional isolated PACs (2.1%) with rare couplets and triplets.   Rare isolated PVCs (<1%) with some couplets, bigeminy and trigeminy.   No patient triggered events.   Overall pretty reassuring study.  Short Bursts of Nonsustained Ventricular Tachycardia none unsuspected with multivessel coronary artery disease.  This will be treated with the same medical treatment to be used for treating coronary disease.  TTE 11/28/2021: EF 55 to 60%.  No RWMA.  Moderate asymmetric LVH-basal septal.  GR 1 DD.  Normal RV.  Normal MV.  AOV sclerosis without stenosis.  Normal RAP.  Cardiac Cath 01/02/2022: dLM ~50%; mLAD 85%_0  90% (1,1,1) - m-dLAD 90$ @ 65% D2 (0,1,1), RI 75%, pLCx 30%-OM1 60%, pRCA 25% - p-mRCA hazy 85%. Normal LVEF by Echo, EDP 16 mmHg. = Best option CABG Diagnostic /Dominance: Right   Interval History:   Thelmer Legler returns today for post cath follow-up.  Thankfully he is accompanied by his daughter today.   We spent almost an hour talking about the  results of his cath as well as echocardiogram and his monitor.  He clearly is having exertional dyspnea that is not any better than it had been before.  His daughter indicates that with just about any activity including walking up the stairs to her house he gets dyspneic and there was some suggestion of potential chest tightness off and on although he denied that.  He says that he has been living for 77 years and if you look at these lesions he should be dead and he is not so he does not understand why things need to be done now.  He cited several articles that he is read indicating that medical management is better than PCI or CABG.  This study is indicated that he should not do stents because they do not work.  He is very fearful of CABG, and keeps trying to rationalize why he should not have bypass surgery.  He was questioning why he should not have stents although he was upset about having to take aspirin and Plavix for his subclavian stent not understanding that that is why aspirin was added.   He was much clearer in his mentation today not as tangential, but it is rambling.  I truthfully think he is in a state of denial not fully understand the extent of his disease.  Thankfully he is not having heart failure symptoms of PND, orthopnea or edema.  He does not do a lot of walking partly because of his neuropathy.  However since his last visit with me he has been pretty sedentary.  He used to play golf routinely and has not been doing so but cannot explain to me why.  Thankfully, his subclavian symptoms are completely resolved after the stent.  He is very happy that seems to be fixated on how easy that was not understanding that had the lesion been just a little bit distal if there was not a landing zone prior to the vertebral artery that would not be an easy procedure either.  We reviewed his Zio patch monitor results, he really has  not noticed that much in the way of any irregular heartbeats or  palpitations has not had any syncope or near syncope.  CV Review of Symptoms (Summary): positive for - dyspnea on exertion, shortness of breath, and daughter indicates very limiting exertional dyspnea.  Possibly some chest tightness that he denies. negative for - edema, irregular heartbeat, orthopnea, palpitations, paroxysmal nocturnal dyspnea, rapid heart rate, or any further TIA or amaurosis fugax symptoms, syncope/near syncope.  Could not get a sense as to whether or not he was having any claudication symptoms but clearly has peripheral neuropathy.  REVIEWED OF SYSTEMS   Review of Systems  Constitutional:  Positive for malaise/fatigue (Mostly just sedentary.  Has not done much berry activity since last visit.). Negative for weight loss.  Eyes:  Negative for blurred vision.  Respiratory:  Positive for cough and shortness of breath (Does not seem to have dyspnea at rest, but daughter indicates dyspnea with very little activity.). Negative for hemoptysis.   Cardiovascular:  Negative for chest pain (Has not really been doing much.).  Gastrointestinal:  Negative for blood in stool, heartburn and melena.  Genitourinary:  Negative for hematuria.  Neurological:  Positive for tingling (Bilateral feet peripheral neuropathy). Negative for dizziness (No longer having subclavian steal syndrome), focal weakness (Left arm no longer weak), loss of consciousness and weakness.  Endo/Heme/Allergies:  Bruises/bleeds easily (Senile purpura).  Psychiatric/Behavioral:  Positive for memory loss. Negative for depression. The patient is nervous/anxious. The patient does not have insomnia.     I have reviewed and (if needed) personally updated the patient's problem list, medications, allergies, past medical and surgical history, social and family history.   PAST MEDICAL HISTORY   Past Medical History:  Diagnosis Date   BENIGN POSITIONAL VERTIGO 05/20/2010   Qualifier: Diagnosis of  By: Madilyn Fireman MD, Reyes Ivan emphysema [facility])    Cerebrovascular disease 07/15/2018   Prior CVA & TIA. Carotid CTA ~50% Bilateral ICA,  Multifocal severe stenosis of the left V2 vertebral artery.  Right vertebral artery patent without significant stenosis.   Coronary artery disease involving native coronary artery with angina pectoris ([place]) 11/18/2017   Severe LM, LAD & RCA stenosis indicated on Coronary CTA => CaCardiac Cath 01/02/2022: dLM ~50%; mLAD 85%_0  90% (1,1,1) - m-dLAD 90$ @ 65% D2 (0,1,1), RI 75%, pLCx 30%-OM1 60%, pRCA 25% - p-mRCA hazy 85%. Normal LVEF by Echo, EDP 16 mmHg. = Best option CABGrdiac Cath    Current every day smoker    No interest in quitting   Diabetes mellitus, type II, insulin dependent ([place])    With peripheral neuropathy, peripheral vascular disease and coronary artery disease as complications.   Epididymitis    Hyperlipemia    Hypertension    Stroke ([place]) 11/2009   Subclavian steal syndrome of left subclavian artery 11/19/2021   Severe stenosis (already seen on CT angiogram (symptoms of left arm claudication, cold left arm, subclavian steal neurologic symptoms. => 12/17/2021: L Subclavia A PTA=Stent with resolution of Sx.   TIA (transient ischemic attack) 11/11/2021   Brief episode of left sided arm pain and slurred speech    PAST SURGICAL HISTORY   Past Surgical History:  Procedure Laterality Date   LEFT HEART CATH AND CORONARY ANGIOGRAPHY N/A 01/02/2022   Procedure: LEFT HEART CATH AND CORONARY ANGIOGRAPHY;  Surgeon: Leonie Man, MD;  Location: [facility] INVASIVE CV LAB:: dLM ~50%; mLAD 85%_1  90% (1,1,1) - m-dLAD 90$ @ 65% D2 (0,1,1), RI 75%, pLCx 30%-OM1 60%, pRCA  25% - p-mRCA hazy 85%. Normal LVEF by Echo, EDP 16 mmHg. = Best option CABG   PILONIDAL CYST EXCISION  1960, 61, 62, 63   TRANSTHORACIC ECHOCARDIOGRAM  11/28/2021   EF 55 to 60%.  No RWMA.  Moderate asymmetric LVH-basal septal.  GR 1 DD.  Normal RV.  Normal MV.  AOV sclerosis without stenosis.  Normal RAP.    UPPER EXTREMITY ANGIOGRAPHY Left 12/17/2021   Procedure: Upper Extremity Angiography;  Surgeon: Katha Cabal, MD;  Location: [place] CV LAB;  Service: Cardiovascular:: High-grade stenosis just distal to the origin of the L-Subclavian A, normal Innom A & L Carotid A => Ost-Prox L SubCl A 8x37x80 Stent - dilated with 9 mm balloon.   VASECTOMY     Zio Patch Monitor  11/2021   Predom Rhtyhm: SR - HR range 47-98 bpm, AVG 64 bpm.  3 runs of NS VT  (> 4 PVC beats): Fastest: 5 beats - HR 125-162 bpm, ~ 145 bpm, 2.1 sec; Longest with Fastest Avg-12 beats, 150-158 bpm, ~ 155 bpm, 4.6 sec.  1  5 beat Atrial Run: HR 125, Avg 120 bpm,   No Sustained Arrhythmias,   Occasional isolated PACs (2.1%) & Rare isolated PVCs (<1%).  No patient triggered events.    Immunization History  Administered Date(s) Administered   Fluad Quad(high Dose 65+) 02/09/2019, 03/07/2020, 03/20/2021   Influenza Split 03/06/2011, 03/05/2012   Influenza Whole 04/14/2009   Influenza, High Dose Seasonal PF 03/26/2016, 01/27/2017, 02/09/2018   Influenza,inj,Quad PF,6+ Mos 03/09/2013, 12/27/2013, 03/21/2015   PFIZER(Purple Top)SARS-COV-2 Vaccination 09/02/2019, 09/27/2019, 04/10/2020   Pneumococcal Conjugate-13 09/26/2014   Pneumococcal Polysaccharide-23 03/15/2009, 12/03/2012   Tdap 05/21/2018    MEDICATIONS/ALLERGIES   Current Meds  Medication Sig   Accu-Chek Softclix Lancets lancets Use to check blood sugars twice daily   aspirin EC 81 MG tablet Take 1 tablet (81 mg total) by mouth daily. Swallow whole.   atorvastatin (LIPITOR) 40 MG tablet TAKE 1 TABLET BY MOUTH  DAILY   Blood Glucose Monitoring Suppl (ACCU-CHEK GUIDE ME) w/Device KIT Check glucose three times daily. Dx. Code E11.8   clopidogrel (PLAVIX) 75 MG tablet TAKE 1 TABLET BY MOUTH  DAILY   fish oil-omega-3 fatty acids 1000 MG capsule Take 4 g by mouth daily. Takes it once in a while   glucose blood (ACCU-CHEK GUIDE) test strip Dx DM E11.9. Check fasting  blood sugar every morning.   Insulin Pen Needle (EASY TOUCH PEN NEEDLES) 31G X 8 MM MISC FOR USE WITH INJECTING  INSULIN DAILY   ketoconazole (NIZORAL) 2 % cream Apply topically daily.   LEVEMIR FLEXPEN 100 UNIT/ML FlexPen INJECT SUBCUTANEOUSLY 50 UNITS  DAILY   nitroGLYCERIN (NITROSTAT) 0.4 MG SL tablet Place 1 tablet (0.4 mg total) under the tongue every 5 (five) minutes as needed for chest pain.   predniSONE (DELTASONE) 20 MG tablet Take 2 tablets (40 mg total) by mouth daily with breakfast.   SYNJARDY 12.5-500 MG TABS TAKE 1 TABLET BY MOUTH  TWICE DAILY   traZODone (DESYREL) 50 MG tablet TAKE 1/2 TO 1 TABLET BY  MOUTH AT BEDTIME AS NEEDED  FOR SLEEP   Current Facility-Administered Medications for the 01/16/22 encounter (Office Visit) with Leonie Man, MD  Medication   sodium chloride flush (NS) 0.9 % injection 3 mL    Allergies  Allergen Reactions   Gabapentin Other (See Comments)    Nightmares.    Lisinopril Other (See Comments)    Hypotension   Metformin And  Related Diarrhea    SOCIAL HISTORY/FAMILY HISTORY   Reviewed in Epic:  Pertinent findings:  Social History   Tobacco Use   Smoking status: Every Day    Packs/day: 1.00    Years: 62.00    Total pack years: 62.00    Types: Cigarettes   Smokeless tobacco: Never  Vaping Use   Vaping Use: Never used  Substance Use Topics   Alcohol use: No   Drug use: No   Social History   Social History Narrative   Lives alone and Likes to play golf 2 x 3 times a week.    OBJCTIVE -PE, EKG, labs   Wt Readings from Last 3 Encounters:  01/16/22 206 lb (93.4 kg)  01/02/22 200 lb (90.7 kg)  12/20/21 202 lb 6 oz (91.8 kg)    Physical Exam: BP 130/78   Pulse 85   Ht _0  (1.854 m)   Wt 206 lb (93.4 kg)   SpO2 97%   BMI 27.18 kg/m  Physical Exam Vitals reviewed.  Constitutional:      General: He is not in acute distress.    Appearance: Normal appearance. He is not ill-appearing.     Comments: Noted less flustered  and confused but still seems to be in denial.  HENT:     Head: Normocephalic and atraumatic.  Neck:     Vascular: Normal carotid pulses. No carotid bruit (No longer hearing a subclavian bruit.) or JVD.  Cardiovascular:     Rate and Rhythm: Normal rate and regular rhythm. Occasional Extrasystoles are present.    Chest Wall: PMI is not displaced.     Pulses: Decreased pulses (Diminished with palpable pedal pulse.).     Heart sounds: S1 normal and S2 normal. Murmur (Soft 1/6 SEM at RUSB.) heard.     No friction rub. No gallop.     Comments: Left-sided radial pulse more palpable Pulmonary:     Effort: Pulmonary effort is normal.     Breath sounds: Normal breath sounds.  Musculoskeletal:        General: No swelling. Normal range of motion.     Cervical back: Normal range of motion and neck supple.  Skin:    General: Skin is warm and dry.     Comments: Ruddy facial complexion.  Mild telangiectasias.  Senile purpura  Neurological:     General: No focal deficit present.     Mental Status: He is alert and oriented to person, place, and time.     Cranial Nerves: No cranial nerve deficit.     Gait: Gait normal.  Psychiatric:        Mood and Affect: Mood normal.     Comments: He keeps perseverating on the fact that he is 76 years old and that he is at this long.  He does not seem to understand the severity of his cardiac disease.  He cites several articles that he is read about medical management versus CABG versus PCI and all stated that medical management was best.  Clearly not indicating that he understands what left main disease means.     Adult ECG Report  Rate: 85;  Rhythm: normal sinus rhythm and left axis deviation.  Cannot rule out anterior septal MI, age-indeterminate.  Cannot rule out inferior MI, age-indeterminate. ;   Narrative Interpretation: Stable  Recent Labs: Reviewed.  Is due to have labs checked by PCP next week. Lab Results  Component Value Date   CHOL 145 11/22/2020    HDL  42 11/22/2020   LDLCALC 78 11/22/2020   TRIG 157 (H) 11/22/2020   CHOLHDL 3.5 11/22/2020   Lab Results  Component Value Date   CREATININE 1.47 (H) 12/31/2021   BUN 15 12/31/2021   NA 138 12/31/2021   K 5.1 12/31/2021   CL 104 12/31/2021   CO2 26 12/31/2021      Latest Ref Rng & Units 12/31/2021   10:01 AM 11/11/2021   11:59 PM 11/22/2020    2:20 PM  CBC  WBC 4.0 - 10.5 K/uL 4.5  5.0  7.3   Hemoglobin 13.0 - 17.0 g/dL 15.5  14.8  15.4   Hematocrit 39.0 - 52.0 % 47.1  44.3  46.1   Platelets 150 - 400 K/uL 209  206  169     Lab Results  Component Value Date   HGBA1C 6.8 (A) 10/15/2021   Lab Results  Component Value Date   TSH 5.14 (H) 11/22/2020    ================================================== I spent a total of 55 minutes with the patient spent in direct patient consultation.  Additional time spent with chart review  / charting (studies, outside notes, etc): 54 min Total Time: 109 min  Current medicines are reviewed at length with the patient today.  (+/- concerns) we discussed the importance of ASA/Plavix  Notice: This dictation was prepared with Dragon dictation along with smart phrase technology. Any transcriptional errors that result from this process are unintentional and may not be corrected upon review.  Studies Ordered:  Orders Placed This Encounter  Procedures   Ambulatory referral to Cardiothoracic Surgery   EKG 12-Lead   Meds ordered this encounter  Medications   bisoprolol (ZEBETA) 5 MG tablet    Sig: Take 0.5 tablets (2.5 mg total) by mouth daily.    Dispense:  90 tablet    Refill:  3    Patient Instructions / Medication Changes & Studies & Tests Ordered   Patient Instructions  Medication Instructions:   Your physician has recommended you make the following change in your medication:   RESTART Lisinopril START bisoprolol - Take 1/2 tablet by mouth daily.    *If you need a refill on your cardiac medications before your next  appointment, please call your pharmacy*   Lab Work:  None Ordered  If you have labs (blood work) drawn today and your tests are completely normal, you will receive your results only by: [place] (if you have MyChart) OR A paper copy in the mail If you have any lab test that is abnormal or we need to change your treatment, we will call you to review the results.   Testing/Procedures:  None Ordered  Follow-Up: At [facility], you and your health needs are our priority.  As part of our continuing mission to provide you with exceptional heart care, we have created designated Provider Care Teams.  These Care Teams include your primary Cardiologist (physician) and Advanced Practice Providers (APPs -  Physician Assistants and Nurse Practitioners) who all work together to provide you with the care you need, when you need it.  We recommend signing up for the patient portal called "MyChart".  Sign up information is provided on this After Visit Summary.  MyChart is used to connect with patients for Virtual Visits (Telemedicine).  Patients are able to view lab/test results, encounter notes, upcoming appointments, etc.  Non-urgent messages can be sent to your provider as well.   To learn more about what you can do with MyChart, go to [email or web address].  Your next appointment:   2 month(s)  The format for your next appointment:   In Person  Provider:   You may see [name], MD or one of the following Advanced Practice Providers on your designated Care Team:   Murray Hodgkins, NP Christell Faith, PA-C Cadence Kathlen Mody, PA-C Gerrie Nordmann, NP   You have been referred to Cardiothoracic surgery - they will call you to schedule an appointment.   Important Information About Sugar             Leonie Man, MD, MS [name], M.D., M.S. Interventional Cardiologist  [facility]   [address]; [place], [place]  [address] [phone]           Fax [phone]    Thank you for choosing [facility] in [place]!!

## 2022-01-17 ENCOUNTER — Encounter: Payer: Self-pay | Admitting: Cardiology

## 2022-01-17 DIAGNOSIS — D692 Other nonthrombocytopenic purpura: Secondary | ICD-10-CM | POA: Insufficient documentation

## 2022-01-17 NOTE — Assessment & Plan Note (Signed)
His blood pressure seems to be little better today.  I am still leery of being very aggressive with blood pressure management.  As a diabetic with multivessel CAD, he should be on beta-blocker and ACE inhibitor at a minimum.  Thankfully, his EF is still preserved.  Plan: Restart 2.5 mg lisinopril, and start bisoprolol 2.5 mg daily (beta-1 selective)

## 2022-01-17 NOTE — Assessment & Plan Note (Signed)
With the severity of vascular disease, especially having left main and LAD disease, now her target LDL should be less than 55.  Most recent lipids I saw had an LDL of 78 that was for the last year.  Due to have labs checked next week by PCP.  He remains on atorvastatin -> pending with notes, would likely consider either switching to rosuvastatin or simply proceeding to inclisiran to ensure better compliance.

## 2022-01-17 NOTE — Assessment & Plan Note (Signed)
He still has exertional dyspnea but really has not been doing much of anything since I last saw him.  At that time he was noticing chest tightness, he did not mention that today although his daughter seem to indicate that there has been episodes where he has noticed that.  Multivessel CAD noted which clearly contribute to etiology along with his centrilobular emphysema.  Clinically, his EF is stable and diastolic function does not seem to be that bad although I suspect it probably could be worse with exertion.  We are putting him back on afterload reduction with lisinopril and adding bisoprolol has been most likely beta-blocker.  Referral to CVTS for CABG evaluation. I suspect his emphysema will be considered as part of the risk for his surgery although it seems to be relatively controlled.[name]

## 2022-01-17 NOTE — Assessment & Plan Note (Signed)
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  Thankfully there was a landing zone to place a stent proximal to the takeoff of the vertebral artery.  Currently on DAPT for the subclavian stent.  He has been on long-term Plavix and aspirin was added.  Would need to check with vascular surgery to determine timing of when Plavix can be held for potential CABG.

## 2022-01-17 NOTE — Assessment & Plan Note (Signed)
Most notable symptom that he is having this profound exertional dyspnea.  This could easily be explained by long-term smoking and COPD, however having left main disease and severe LAD lesions that are clearly ischemic in nature are most certainly combining to play a role.  We spent quite a long time reviewing his cath films reviewing the images and discussing the lesions.  I explained the importance of left main disease and to bifurcation lesions in the LAD, indicating that this would be extensive PCI would not be a favorable option.  I also felt that simply medical management, while not subjecting him to surgery would probably not prevent progression of disease since he does not stop smoking.  I indicated that he would likely develop progressive disease and therefore lead to progressively worsening cardiac function and potentially ischemic cardiomyopathy with heart failure.  We went round and round about this topic.  My recommendation is referral to CVTS and refer them to discuss the risk benefits alternatives and indications of CABG.  We did spend quite a bit of time talking about CABG.  He finally was agreeable to meet with the surgeons, but still seems to be fixated on medical therapy.  I explained to him that regardless of what we do, we will continue to push medical therapy but he does not have a lot of blood pressure room etc. to work with.  Plan:  CVTS referral to discuss CABG  Need to see lipid panel from PCP recheck next week.  Target LDL should be less than 55.  Continue DAPT for now pending CABG.  We need to determine from Dr. [name] as to how long he needs to be on DAPT (although I suspect that we will be okay holding Plavix for CABG)  Restart lisinopril and initiate low-dose beta-blocker with bisoprolol 2.5 mg daily.  Smoking cessation counseling again provided.  Continue to recommend aggressive diabetes management.  He is on Synjardy providing empagliflozin (SGLT2  inhibitor)-metformin along with insulin.

## 2022-01-17 NOTE — Assessment & Plan Note (Signed)
Now on aspirin and Plavix.  He perseverated quite a bit about being on the combination of aspirin and Plavix going back to when he had a stroke and being told he did not need to be on both aspirin and Plavix.  Not understanding that the reason why he is now back on aspirin at that he has a stent in the subclavian artery and therefore is on DAPT. He has noted more bruising since being on both ASA and Plavix. The very fact that he does not like to be on dual antiplatelet therapy is he had again another indication that multivessel PCI including left main PCI is not a good option for him.

## 2022-01-17 NOTE — Assessment & Plan Note (Signed)
See CT scan results-indicates that there may be increased size in mass when compared to 2018.  Will defer to PCP.

## 2022-01-20 ENCOUNTER — Ambulatory Visit (INDEPENDENT_AMBULATORY_CARE_PROVIDER_SITE_OTHER): Payer: Medicare Other | Admitting: Family Medicine

## 2022-01-20 ENCOUNTER — Encounter: Payer: Self-pay | Admitting: Family Medicine

## 2022-01-20 ENCOUNTER — Telehealth: Payer: Self-pay | Admitting: Family Medicine

## 2022-01-20 VITALS — BP 115/83 | HR 61 | Wt 208.0 lb

## 2022-01-20 DIAGNOSIS — F17200 Nicotine dependence, unspecified, uncomplicated: Secondary | ICD-10-CM

## 2022-01-20 DIAGNOSIS — I10 Essential (primary) hypertension: Secondary | ICD-10-CM

## 2022-01-20 DIAGNOSIS — I25119 Atherosclerotic heart disease of native coronary artery with unspecified angina pectoris: Secondary | ICD-10-CM

## 2022-01-20 DIAGNOSIS — Z23 Encounter for immunization: Secondary | ICD-10-CM

## 2022-01-20 DIAGNOSIS — E118 Type 2 diabetes mellitus with unspecified complications: Secondary | ICD-10-CM | POA: Diagnosis not present

## 2022-01-20 LAB — POCT GLYCOSYLATED HEMOGLOBIN (HGB A1C): Hemoglobin A1C: 7.1 % — AB (ref 4.0–5.6)

## 2022-01-20 MED ORDER — ATORVASTATIN CALCIUM 80 MG PO TABS: 80.0000 mg | ORAL_TABLET | Freq: Every evening | ORAL | 3 refills | Status: DC

## 2022-01-20 MED ORDER — VARENICLINE TARTRATE 1 MG PO TABS: ORAL_TABLET | ORAL | 2 refills | Status: DC

## 2022-01-20 NOTE — Assessment & Plan Note (Addendum)
Maximize statin to 80 mg and then have repeat blood work done in about 2 weeks.  10 you statin, after sprain, Plavix, beta-blocker, SGL2

## 2022-01-20 NOTE — Assessment & Plan Note (Signed)
Pressure looks fantastic today.  Continue current regimen on low-dose of lisinopril and bisoprolol.

## 2022-01-20 NOTE — Progress Notes (Signed)
Established Patient Office Visit  Subjective   Patient ID: Zaide Kardell, male    DOB: 1945-10-19  Age: 76 y.o. MRN: [id number]  Chief Complaint  Patient presents with   Diabetes    HPI  He updated me about his last several months.  He is here today with his son.  He is interested in making some lifestyle changes including quitting smoking.  He smokes multiple packs per day  Diabetes - no hypoglycemic events. No wounds or sores that are not healing well. No increased thirst or urination. Checking glucose at home. Taking medications as prescribed without any side effects.  CAD - now dx with CAD.  Has consult with CVTS in October. They are currently recommending bypass surgery.  He really wants to avoid surgery if possible and really work on medication management he understands that his quality of life may not improve but he says he is okay with where he is that as long as he does not have a major change in impact on duration.  Had left subclavian stented. Was having intermittent left arm numbness. Symptoms have completely resolved since the stent placement.    ROS    Objective:     BP 115/83   Pulse 61   Wt 208 lb (94.3 kg)   SpO2 99%   BMI 27.44 kg/m    Physical Exam Constitutional:      Appearance: He is well-developed.  HENT:     Head: Normocephalic and atraumatic.  Cardiovascular:     Rate and Rhythm: Normal rate and regular rhythm.     Heart sounds: Normal heart sounds.  Pulmonary:     Effort: Pulmonary effort is normal.     Breath sounds: Normal breath sounds.  Skin:    General: Skin is warm and dry.  Neurological:     Mental Status: He is alert and oriented to person, place, and time.  Psychiatric:        Behavior: Behavior normal.      Results for orders placed or performed in visit on 01/20/22  POCT glycosylated hemoglobin (Hb A1C)  Result Value Ref Range   Hemoglobin A1C 7.1 (A) 4.0 - 5.6 %   HbA1c POC (<> result, manual entry)     HbA1c,  POC (prediabetic range)     HbA1c, POC (controlled diabetic range)        The 10-year ASCVD risk score (Arnett DK, et al., 2019) is: 42.4%    Assessment & Plan:   Problem List Items Addressed This Visit       Cardiovascular and Mediastinum   Essential hypertension (Chronic)    Pressure looks fantastic today.  Continue current regimen on low-dose of lisinopril and bisoprolol.      Relevant Medications   atorvastatin (LIPITOR) 80 MG tablet   Other Relevant Orders   Lipid Panel w/reflex Direct LDL   COMPLETE METABOLIC PANEL WITH GFR   CBC   Coronary artery disease involving native coronary artery with angina pectoris (HCC) (Chronic)    Maximize statin to 80 mg and then have repeat blood work done in about 2 weeks.  10 you statin, after sprain, Plavix, beta-blocker, SGL2      Relevant Medications   atorvastatin (LIPITOR) 80 MG tablet   Other Relevant Orders   Lipid Panel w/reflex Direct LDL   COMPLETE METABOLIC PANEL WITH GFR   CBC     Endocrine   Controlled diabetes mellitus type 2 with complications (HCC) - Primary (Chronic)  1C is crept up slightly to 7.1 he admits he has not been necessarily doing the best with his diet.  Encouraged him to continue to work on food choices.  He has lost weight which is great but he needs to continue to work on eating healthy.  Continue current medication regimen.  Follow-up in 3 months if A1c is not back down then we will need to make adjustment to his regimen.      Relevant Medications   atorvastatin (LIPITOR) 80 MG tablet   Other Relevant Orders   POCT glycosylated hemoglobin (Hb A1C) (Completed)   Lipid Panel w/reflex Direct LDL   COMPLETE METABOLIC PANEL WITH GFR   CBC     Other   TOBACCO ABUSE (Chronic)    Really wants to work on smoking cessation and wants to know what would help him be most successful.  He has never tried Chantix before we will start with that medication.  Quit date will be 1 week after starting the  medication.      Relevant Medications   varenicline (CHANTIX) 1 MG tablet   Other Visit Diagnoses     Influenza vaccine needed       Relevant Orders   Flu Vaccine QUAD High Dose(Fluad) (Completed)       No follow-ups on file.   I spent 40 minutes on the day of the encounter to include pre-visit record review, face-to-face time with the patient and post visit ordering of test.    Beatrice Lecher, MD

## 2022-01-20 NOTE — Telephone Encounter (Signed)
Please call [name] and let him know that once he picks up the atorvastatin he starts the 80 mg, to please come back for blood work fasting in about 2 weeks or he can do it closer to home if he would like.

## 2022-01-20 NOTE — Assessment & Plan Note (Signed)
1C is crept up slightly to 7.1 he admits he has not been necessarily doing the best with his diet.  Encouraged him to continue to work on food choices.  He has lost weight which is great but he needs to continue to work on eating healthy.  Continue current medication regimen.  Follow-up in 3 months if A1c is not back down then we will need to make adjustment to his regimen.

## 2022-01-20 NOTE — Assessment & Plan Note (Signed)
Really wants to work on smoking cessation and wants to know what would help him be most successful.  He has never tried Chantix before we will start with that medication.  Quit date will be 1 week after starting the medication.

## 2022-01-21 NOTE — Telephone Encounter (Signed)
Pt advised he stated that he would like this to go to [facility].  Lab order will need to be faxed 1-2 days prior [phone]

## 2022-02-04 ENCOUNTER — Telehealth: Payer: Self-pay

## 2022-02-04 NOTE — Telephone Encounter (Signed)
Jon [name] called to cancel his appt/consult with Dr Prescott Gum. He said he would call back to reschedule at later date. He was inform that new pt consults are booking out up to 4-6 weeks

## 2022-02-10 ENCOUNTER — Encounter: Payer: Medicare Other | Admitting: Cardiothoracic Surgery

## 2022-02-10 ENCOUNTER — Telehealth: Payer: Self-pay

## 2022-02-10 DIAGNOSIS — I10 Essential (primary) hypertension: Secondary | ICD-10-CM

## 2022-02-10 DIAGNOSIS — E1169 Type 2 diabetes mellitus with other specified complication: Secondary | ICD-10-CM

## 2022-02-10 NOTE — Addendum Note (Signed)
Addended by: Narda Rutherford on: 02/10/2022 01:24 PM   Modules accepted: Orders

## 2022-02-10 NOTE — Telephone Encounter (Signed)
[name] called and wanted his labs sent to [facility]. I advised him there is a Building control surveyor in [place]. [place]

## 2022-02-12 DIAGNOSIS — E1169 Type 2 diabetes mellitus with other specified complication: Secondary | ICD-10-CM | POA: Diagnosis not present

## 2022-02-12 DIAGNOSIS — E785 Hyperlipidemia, unspecified: Secondary | ICD-10-CM | POA: Diagnosis not present

## 2022-02-12 DIAGNOSIS — I10 Essential (primary) hypertension: Secondary | ICD-10-CM | POA: Diagnosis not present

## 2022-02-13 LAB — CMP14+EGFR
ALT: 23 IU/L (ref 0–44)
AST: 16 IU/L (ref 0–40)
Albumin/Globulin Ratio: 1.8 (ref 1.2–2.2)
Albumin: 4.2 g/dL (ref 3.8–4.8)
Alkaline Phosphatase: 102 IU/L (ref 44–121)
BUN/Creatinine Ratio: 10 (ref 10–24)
BUN: 15 mg/dL (ref 8–27)
Bilirubin Total: 0.3 mg/dL (ref 0.0–1.2)
CO2: 22 mmol/L (ref 20–29)
Calcium: 9.2 mg/dL (ref 8.6–10.2)
Chloride: 102 mmol/L (ref 96–106)
Creatinine, Ser: 1.49 mg/dL — ABNORMAL HIGH (ref 0.76–1.27)
Globulin, Total: 2.3 g/dL (ref 1.5–4.5)
Glucose: 92 mg/dL (ref 70–99)
Potassium: 4.5 mmol/L (ref 3.5–5.2)
Sodium: 140 mmol/L (ref 134–144)
Total Protein: 6.5 g/dL (ref 6.0–8.5)
eGFR: 49 mL/min/{1.73_m2} — ABNORMAL LOW (ref >59)

## 2022-02-13 LAB — CBC WITH DIFFERENTIAL/PLATELET
Basophils Absolute: 0 10*3/uL (ref 0.0–0.2)
Basos: 1 %
EOS (ABSOLUTE): 0.2 10*3/uL (ref 0.0–0.4)
Eos: 3 %
Hematocrit: 43.4 % (ref 37.5–51.0)
Hemoglobin: 14.9 g/dL (ref 13.0–17.7)
Immature Grans (Abs): 0 10*3/uL (ref 0.0–0.1)
Immature Granulocytes: 0 %
Lymphocytes Absolute: 1.5 10*3/uL (ref 0.7–3.1)
Lymphs: 27 %
MCH: 32 pg (ref 26.6–33.0)
MCHC: 34.3 g/dL (ref 31.5–35.7)
MCV: 93 fL (ref 79–97)
Monocytes Absolute: 0.5 10*3/uL (ref 0.1–0.9)
Monocytes: 9 %
Neutrophils Absolute: 3.4 10*3/uL (ref 1.4–7.0)
Neutrophils: 60 %
Platelets: 156 10*3/uL (ref 150–450)
RBC: 4.65 x10E6/uL (ref 4.14–5.80)
RDW: 12.3 % (ref 11.6–15.4)
WBC: 5.5 10*3/uL (ref 3.4–10.8)

## 2022-02-13 LAB — LIPID PANEL
Chol/HDL Ratio: 2.9 ratio (ref 0.0–5.0)
Cholesterol, Total: 105 mg/dL (ref 100–199)
HDL: 36 mg/dL — ABNORMAL LOW (ref >39)
LDL Chol Calc (NIH): 46 mg/dL (ref 0–99)
Triglycerides: 126 mg/dL (ref 0–149)
VLDL Cholesterol Cal: 23 mg/dL (ref 5–40)

## 2022-02-13 NOTE — Progress Notes (Signed)
HI Jon [name], your LDL look good.  Kidney function is stable. Blood count is norma.\l

## 2022-02-20 ENCOUNTER — Ambulatory Visit: Payer: Medicare Other | Admitting: Cardiology

## 2022-03-10 ENCOUNTER — Encounter (INDEPENDENT_AMBULATORY_CARE_PROVIDER_SITE_OTHER): Payer: Self-pay

## 2022-03-20 ENCOUNTER — Ambulatory Visit: Payer: Medicare Other | Admitting: Cardiology

## 2022-04-15 ENCOUNTER — Other Ambulatory Visit: Payer: Self-pay | Admitting: Family Medicine

## 2022-04-15 DIAGNOSIS — F17200 Nicotine dependence, unspecified, uncomplicated: Secondary | ICD-10-CM

## 2022-05-01 ENCOUNTER — Other Ambulatory Visit: Payer: Self-pay

## 2022-05-01 DIAGNOSIS — E118 Type 2 diabetes mellitus with unspecified complications: Secondary | ICD-10-CM

## 2022-05-01 MED ORDER — EASY TOUCH PEN NEEDLES 31G X 8 MM MISC: 3 refills | Status: DC

## 2022-05-02 ENCOUNTER — Telehealth: Payer: Self-pay | Admitting: Family Medicine

## 2022-05-02 NOTE — Telephone Encounter (Signed)
Received notification that the Levemir is no longer going to be manufactured but it looks like he is not actively using the medication right now.

## 2022-05-15 ENCOUNTER — Ambulatory Visit (INDEPENDENT_AMBULATORY_CARE_PROVIDER_SITE_OTHER): Payer: Medicare Other | Admitting: Family Medicine

## 2022-05-15 ENCOUNTER — Encounter: Payer: Self-pay | Admitting: Family Medicine

## 2022-05-15 VITALS — BP 129/60 | HR 63 | Ht 73.0 in | Wt 205.0 lb

## 2022-05-15 DIAGNOSIS — R253 Fasciculation: Secondary | ICD-10-CM | POA: Diagnosis not present

## 2022-05-15 DIAGNOSIS — I10 Essential (primary) hypertension: Secondary | ICD-10-CM

## 2022-05-15 DIAGNOSIS — E118 Type 2 diabetes mellitus with unspecified complications: Secondary | ICD-10-CM

## 2022-05-15 DIAGNOSIS — L821 Other seborrheic keratosis: Secondary | ICD-10-CM | POA: Diagnosis not present

## 2022-05-15 DIAGNOSIS — F17200 Nicotine dependence, unspecified, uncomplicated: Secondary | ICD-10-CM

## 2022-05-15 LAB — POCT GLYCOSYLATED HEMOGLOBIN (HGB A1C): Hemoglobin A1C: 8.7 % — AB (ref 4.0–5.6)

## 2022-05-15 LAB — POCT UA - MICROALBUMIN
Albumin/Creatinine Ratio, Urine, POC: <30
Creatinine, POC: 100 mg/dL
Microalbumin Ur, POC: 10 mg/L

## 2022-05-15 NOTE — Patient Instructions (Signed)
Please increase your insulin to 48 units nightly.   Please check your blood sugars fasting in the morning 3 days a week and write them down.  If you can send me those numbers in about 3 weeks so I can take a look at them and make adjustments to your regimen.  Make sure you are cutting back on sweets and carbs.  Also try to walk for about 15 minutes every day in addition to your regular routine.

## 2022-05-15 NOTE — Assessment & Plan Note (Signed)
Continue with Chantix.  He is actually doing really well I think he might benefit from a 55-monthcourse.  He says he still thinks about smoking about 20% of the time.

## 2022-05-15 NOTE — Progress Notes (Signed)
Established Patient Office Visit  Subjective   Patient ID: Jon [name], male    DOB: 03-30-1946  Age: 77 y.o. MRN: [id number]  Chief Complaint  Patient presents with   Diabetes    Eye exam requested    HPI Diabetes - no hypoglycemic events. No wounds or sores that are not healing well. No increased thirst or urination. Checking glucose at home. Taking medications as prescribed without any side effects.  Hypertension- Pt denies chest pain, SOB, dizziness, or heart palpitations.  Taking meds as directed w/o problems.  Denies medication side effects.    He also has several lesions on his scalp we have frozen them in the past he says he feels like he is getting more of them and they are getting a little larger.  Is also been noticing that he has an occasional twitch.  His body will just jerk.  It can happen every couple of minutes or even as often as every 45 seconds.  It usually happens at rest or when he is watching TV.  He is very sedentary he has had a lot of medication changes recently.  When I last saw him we increased his atorvastatin to 80 mg and he did start Chantix for smoking cessation.  Tobacco abuse-doing really well with Chantix in fact his last cigarette was September 3.  He says he has not gained any weight but he has been eating a little bit more.    ROS    Objective:     BP 129/60   Pulse 63   Ht '6\' 1"'$  (1.854 m)   Wt 205 lb (93 kg)   SpO2 98%   BMI 27.05 kg/m    Physical Exam Constitutional:      Appearance: He is well-developed.  HENT:     Head: Normocephalic and atraumatic.  Cardiovascular:     Rate and Rhythm: Normal rate and regular rhythm.     Heart sounds: Normal heart sounds.  Pulmonary:     Effort: Pulmonary effort is normal.     Breath sounds: Normal breath sounds.  Skin:    General: Skin is warm and dry.  Neurological:     Mental Status: He is alert and oriented to person, place, and time.  Psychiatric:        Behavior: Behavior  normal.      Results for orders placed or performed in visit on 05/15/22  POCT glycosylated hemoglobin (Hb A1C)  Result Value Ref Range   Hemoglobin A1C 8.7 (A) 4.0 - 5.6 %   HbA1c POC (<> result, manual entry)     HbA1c, POC (prediabetic range)     HbA1c, POC (controlled diabetic range)    POCT UA - Microalbumin  Result Value Ref Range   Microalbumin Ur, POC 10 mg/L   Creatinine, POC 100 mg/dL   Albumin/Creatinine Ratio, Urine, POC <30       The ASCVD Risk score (Arnett DK, et al., 2019) failed to calculate for the following reasons:   The valid total cholesterol range is 130 to 320 mg/dL    Assessment & Plan:   Problem List Items Addressed This Visit       Cardiovascular and Mediastinum   Essential hypertension (Chronic)    Well controlled. Continue current regimen. Follow up in  6 mo         Endocrine   Controlled diabetes mellitus type 2 with complications ([place]) - Primary (Chronic)    A1c jumped up significantly today to  8.7.  Will increase insulin to 48 units nightly encouraged him to really get back on track with diet I think recent smoking cessation has bumped up his appetite.  Continue with the Synjardy.  We do have room to go up on the metformin component of the Synjardy but unfortunately higher doses tend to cause diarrhea for him.      Relevant Orders   POCT glycosylated hemoglobin (Hb A1C) (Completed)   POCT UA - Microalbumin (Completed)     Other   TOBACCO ABUSE (Chronic)    Continue with Chantix.  He is actually doing really well I think he might benefit from a 92-monthcourse.  He says he still thinks about smoking about 20% of the time.      Other Visit Diagnoses     Seborrheic keratoses       Twitching           Twitching -we will see if this is a potential side effects of Chantix or atorvastatin.  I have not heard of this before but it certainly could be.  Seborrheic keratoses-discussed cryotherapy or dermatology consultation for  treatment.  Did discuss the benign nature of these lesions but certainly if they are getting larger then it may be worth having cryotherapy treatment done.  Return in about 3 months (around 08/14/2022) for Diabetes follow-up.   I spent 45 minutes on the day of the encounter to include pre-visit record review, face-to-face time with the patient and post visit ordering of test.   CBeatrice Lecher MD

## 2022-05-15 NOTE — Assessment & Plan Note (Signed)
A1c jumped up significantly today to 8.7.  Will increase insulin to 48 units nightly encouraged him to really get back on track with diet I think recent smoking cessation has bumped up his appetite.  Continue with the Synjardy.  We do have room to go up on the metformin component of the Synjardy but unfortunately higher doses tend to cause diarrhea for him.

## 2022-05-15 NOTE — Assessment & Plan Note (Signed)
Well controlled. Continue current regimen. Follow up in  6 mo  

## 2022-06-05 ENCOUNTER — Encounter: Payer: Self-pay | Admitting: Family Medicine

## 2022-06-06 ENCOUNTER — Other Ambulatory Visit: Payer: Self-pay | Admitting: Family Medicine

## 2022-06-24 DIAGNOSIS — H2513 Age-related nuclear cataract, bilateral: Secondary | ICD-10-CM | POA: Diagnosis not present

## 2022-06-24 DIAGNOSIS — H40013 Open angle with borderline findings, low risk, bilateral: Secondary | ICD-10-CM | POA: Diagnosis not present

## 2022-06-24 DIAGNOSIS — E119 Type 2 diabetes mellitus without complications: Secondary | ICD-10-CM | POA: Diagnosis not present

## 2022-06-24 LAB — HM DIABETES EYE EXAM

## 2022-06-25 ENCOUNTER — Encounter: Payer: Self-pay | Admitting: Family Medicine

## 2022-07-30 ENCOUNTER — Ambulatory Visit (INDEPENDENT_AMBULATORY_CARE_PROVIDER_SITE_OTHER): Payer: Medicare Other | Admitting: Urology

## 2022-07-30 ENCOUNTER — Encounter: Payer: Self-pay | Admitting: Urology

## 2022-07-30 VITALS — BP 80/54 | HR 54 | Ht 73.0 in | Wt 200.0 lb

## 2022-07-30 DIAGNOSIS — N5082 Scrotal pain: Secondary | ICD-10-CM

## 2022-07-30 DIAGNOSIS — N451 Epididymitis: Secondary | ICD-10-CM

## 2022-07-30 DIAGNOSIS — R109 Unspecified abdominal pain: Secondary | ICD-10-CM

## 2022-07-30 LAB — URINALYSIS, COMPLETE
Bilirubin, UA: NEGATIVE
Glucose, UA: NEGATIVE
Ketones, UA: NEGATIVE
Leukocytes,UA: NEGATIVE
Nitrite, UA: NEGATIVE
Protein,UA: NEGATIVE
RBC, UA: NEGATIVE
Specific Gravity, UA: 1.01 (ref 1.005–1.030)
Urobilinogen, Ur: 0.2 mg/dL (ref 0.2–1.0)
pH, UA: 5 (ref 5.0–7.5)

## 2022-07-30 LAB — MICROSCOPIC EXAMINATION

## 2022-07-30 MED ORDER — ETODOLAC 400 MG PO TABS: 400.0000 mg | ORAL_TABLET | Freq: Two times a day (BID) | ORAL | 0 refills | Status: AC

## 2022-07-30 NOTE — Progress Notes (Deleted)
07/30/2022 12:56 PM   [name] [birth year] [id number]  Referring provider: [name], [place],  [place] [address]  Chief Complaint  Patient presents with   Groin Pain    HPI: Jon [name] is a 77 y.o. male self-referred for evaluation of a right scrotal pain.  States left undescended testis at birth and underwent groin exploration in 1960 with findings of an atrophic testis however he states this was not excised Had a vasectomy in 1988 and within 1 week after the procedure was treated for epididymitis No recurrent problems until recently.  States he had not masturbated in 6-12 months and while masturbating was close to climax but unable to ejaculate and subsequently developed right hemiscrotal pain and swelling which was similar to his previous episode of epididymitis No bothersome LUTS or dysuria No fever, chills or gross hematuria   PMH: Past Medical History:  Diagnosis Date   BENIGN POSITIONAL VERTIGO 05/20/2010   Qualifier: Diagnosis of  By: Madilyn Fireman MD, Catherine     Centrilobular emphysema [facility])    Cerebrovascular disease 07/15/2018   Prior CVA & TIA. Carotid CTA ~50% Bilateral ICA,  Multifocal severe stenosis of the left V2 vertebral artery.  Right vertebral artery patent without significant stenosis.   Coronary artery disease involving native coronary artery with angina pectoris ([place]) 11/18/2017   Severe LM, LAD & RCA stenosis indicated on Coronary CTA => CaCardiac Cath 01/02/2022: dLM ~50%; mLAD 85%@D1  90% (1,1,1) - m-dLAD 90$ @ 65% D2 (0,1,1), RI 75%, pLCx 30%-OM1 60%, pRCA 25% - p-mRCA hazy 85%. Normal LVEF by Echo, EDP 16 mmHg. = Best option CABGrdiac Cath    Current every day smoker    No interest in quitting   Diabetes mellitus, type II, insulin dependent ([place])    With peripheral neuropathy, peripheral vascular disease and coronary artery disease as complications.   Epididymitis    Hyperlipemia     Hypertension    Stroke ([place]) 11/2009   Subclavian steal syndrome of left subclavian artery 11/19/2021   Severe stenosis (already seen on CT angiogram (symptoms of left arm claudication, cold left arm, subclavian steal neurologic symptoms. => 12/17/2021: L Subclavia A PTA=Stent with resolution of Sx.   TIA (transient ischemic attack) 11/11/2021   Brief episode of left sided arm pain and slurred speech    Surgical History: Past Surgical History:  Procedure Laterality Date   LEFT HEART CATH AND CORONARY ANGIOGRAPHY N/A 01/02/2022   Procedure: LEFT HEART CATH AND CORONARY ANGIOGRAPHY;  Surgeon: Leonie Man, MD;  Location: [facility] INVASIVE CV LAB:: dLM ~50%; mLAD 85%@D1  90% (1,1,1) - m-dLAD 90$ @ 65% D2 (0,1,1), RI 75%, pLCx 30%-OM1 60%, pRCA 25% - p-mRCA hazy 85%. Normal LVEF by Echo, EDP 16 mmHg. = Best option CABG   PILONIDAL CYST EXCISION  1960, 61, 62, 63   TRANSTHORACIC ECHOCARDIOGRAM  11/28/2021   EF 55 to 60%.  No RWMA.  Moderate asymmetric LVH-basal septal.  GR 1 DD.  Normal RV.  Normal MV.  AOV sclerosis without stenosis.  Normal RAP.   UPPER EXTREMITY ANGIOGRAPHY Left 12/17/2021   Procedure: Upper Extremity Angiography;  Surgeon: Katha Cabal, MD;  Location: [place] CV LAB;  Service: Cardiovascular:: High-grade stenosis just distal to the origin of the L-Subclavian A, normal Innom A & L Carotid A => Ost-Prox L SubCl A 8x37x80 Stent - dilated with 9 mm balloon.   VASECTOMY     Zio Patch Monitor  11/2021   Predom Rhtyhm: SR - HR range 47-98 bpm, AVG 64 bpm.  3 runs of NS VT  (> 4 PVC beats): Fastest: 5 beats - HR 125-162 bpm, ~ 145 bpm, 2.1 sec; Longest with Fastest Avg-12 beats, 150-158 bpm, ~ 155 bpm, 4.6 sec.  1  5 beat Atrial Run: HR 125, Avg 120 bpm,   No Sustained Arrhythmias,   Occasional isolated PACs (2.1%) & Rare isolated PVCs (<1%).  No patient triggered events.    Home Medications:  Allergies as of 07/30/2022       Reactions   Gabapentin Other (See Comments)    Nightmares.    Lisinopril Other (See Comments)   Hypotension   Metformin And Related Diarrhea        Medication List        Accurate as of July 30, 2022 12:56 PM. If you have any questions, ask your nurse or doctor.          Accu-Chek Guide Me w/Device Kit Check glucose three times daily. Dx. Code E11.8   Accu-Chek Guide test strip Generic drug: glucose blood Dx DM E11.9. Check fasting blood sugar every morning.   Accu-Chek Softclix Lancets lancets Use to check blood sugars twice daily   aspirin EC 81 MG tablet Take 1 tablet (81 mg total) by mouth daily. Swallow whole.   atorvastatin 80 MG tablet Commonly known as: LIPITOR Take 1 tablet (80 mg total) by mouth at bedtime.   bisoprolol 5 MG tablet Commonly known as: ZEBETA Take 0.5 tablets (2.5 mg total) by mouth daily.   clopidogrel 75 MG tablet Commonly known as: PLAVIX TAKE 1 TABLET BY MOUTH DAILY   Easy Touch Pen Needles 31G X 8 MM Misc Generic drug: Insulin Pen Needle FOR USE WITH INJECTING  INSULIN DAILY   etodolac 400 MG tablet Commonly known as: Lodine Take 1 tablet (400 mg total) by mouth 2 (two) times daily for 14 days. Started by: Abbie Sons, MD   Fish Oil 1000 MG Caps Take 2 capsules by mouth daily.   lisinopril 2.5 MG tablet Commonly known as: ZESTRIL TAKE 1 TABLET BY MOUTH DAILY   Synjardy 12.5-500 MG Tabs Generic drug: Empagliflozin-metFORMIN HCl TAKE 1 TABLET BY MOUTH  TWICE DAILY   traZODone 50 MG tablet Commonly known as: DESYREL TAKE 1/2 TO 1 TABLET BY  MOUTH AT BEDTIME AS NEEDED  FOR SLEEP   varenicline 1 MG tablet Commonly known as: CHANTIX Take 1 tablet (1 mg total) by mouth 2 (two) times daily.        Allergies:  Allergies  Allergen Reactions   Gabapentin Other (See Comments)    Nightmares.    Lisinopril Other (See Comments)    Hypotension   Metformin And Related Diarrhea    Family History: Family History  Problem Relation Age of Onset   Diabetes  Other        family hx of   Colon cancer Neg Hx     Social History:  reports that he has been smoking cigarettes. He has a 62.00 pack-year smoking history. He has never used smokeless tobacco. He reports that he does not drink alcohol and does not use drugs.   Physical Exam: BP (!) 80/54   Pulse (!) 54   Ht 6\' 1"  (1.854 m)   Wt 200 lb (90.7 kg)   BMI 26.39 kg/m   Constitutional:  Alert and oriented, No acute distress. HEENT: [place] AT Respiratory: Normal respiratory effort, no increased work of  breathing. GU: Phallus without lesions.  Left testis not palpable.  Right testis atrophic with tenderness of the globus minor Psychiatric: Normal mood and affect.  Laboratory Data:  Urinalysis Dipstick/microscopy negative   Assessment & Plan:    1.  Right scrotal pain Most likely inflammatory epididymitis Rx etodolac 400 mg twice daily x 2 weeks then prn for persistent symptoms He states he takes a baby aspirin and regular aspirin daily.  Recommend he hold his regular aspirin while taking etodolac Follow-up as needed for persistent/recurrent symptoms    I have reviewed the above documentation for accuracy and completeness, and I agree with the above.   Abbie Sons, [place] [address] [place], [place] [address] [phone]

## 2022-07-30 NOTE — Progress Notes (Signed)
I, Jon [name],acting as a scribe for Jon Sons, [name].,have documented all relevant documentation on the behalf of Jon Sons, [name],as directed by  Jon Sons, [name] while in the presence of Jon Sons, [name].   07/30/22 11:33 AM   Jon [name] 07/26/1945 [id number]  Referring provider: Hali [name], Jon [name],  [place] [address]  Chief Complaint  Patient presents with   Groin Pain    HPI: Jon [name] is a 77 y.o. male self-referred for groin pain.  History of an undescended left testicle corrected surgically in 1960 He presents with complaints of scrotal pain reminiscent of a previous episode of epididymitis experienced post-vasectomy in 1988. He reports a long history of normal sexual function and fertility, having fathered two children. Following a period of sexual inactivity, he recently attempted masturbation, which resulted in incomplete ejaculation and subsequent scrotal pain similar to his past experience with epididymitis. The pain has been persistent, leading him to seek medical evaluation.  Denies any urinary symptoms but mentions a history of diabetes requiring frequent urination.  History of stroke 25 years ago, on Aspirin   PMH: Past Medical History:  Diagnosis Date   BENIGN POSITIONAL VERTIGO 05/20/2010   Qualifier: Diagnosis of  By: Jon [name], Jon [name] emphysema [facility])    Cerebrovascular disease 07/15/2018   Prior CVA & TIA. Carotid CTA ~50% Bilateral ICA,  Multifocal severe stenosis of the left V2 vertebral artery.  Right vertebral artery patent without significant stenosis.   Coronary artery disease involving native coronary artery with angina pectoris ([place]) 11/18/2017   Severe LM, LAD & RCA stenosis indicated on Coronary CTA => CaCardiac Cath 01/02/2022: dLM ~50%; mLAD 85%@D1  90% (1,1,1) - m-dLAD 90$ @ 65% D2 (0,1,1), RI 75%, pLCx 30%-OM1 60%, pRCA 25% - p-mRCA hazy 85%. Normal  LVEF by Echo, EDP 16 mmHg. = Best option CABGrdiac Cath    Current every day smoker    No interest in quitting   Diabetes mellitus, type II, insulin dependent ([place])    With peripheral neuropathy, peripheral vascular disease and coronary artery disease as complications.   Epididymitis    Hyperlipemia    Hypertension    Stroke ([place]) 11/2009   Subclavian steal syndrome of left subclavian artery 11/19/2021   Severe stenosis (already seen on CT angiogram (symptoms of left arm claudication, cold left arm, subclavian steal neurologic symptoms. => 12/17/2021: L Subclavia A PTA=Stent with resolution of Sx.   TIA (transient ischemic attack) 11/11/2021   Brief episode of left sided arm pain and slurred speech    Surgical History: Past Surgical History:  Procedure Laterality Date   LEFT HEART CATH AND CORONARY ANGIOGRAPHY N/A 01/02/2022   Procedure: LEFT HEART CATH AND CORONARY ANGIOGRAPHY;  Surgeon: Jon Man, [name];  Location: [facility] INVASIVE CV LAB:: dLM ~50%; mLAD 85%@D1  90% (1,1,1) - m-dLAD 90$ @ 65% D2 (0,1,1), RI 75%, pLCx 30%-OM1 60%, pRCA 25% - p-mRCA hazy 85%. Normal LVEF by Echo, EDP 16 mmHg. = Best option CABG   PILONIDAL CYST EXCISION  1960, 61, 62, 63   TRANSTHORACIC ECHOCARDIOGRAM  11/28/2021   EF 55 to 60%.  No RWMA.  Moderate asymmetric LVH-basal septal.  GR 1 DD.  Normal RV.  Normal MV.  AOV sclerosis without stenosis.  Normal RAP.   UPPER EXTREMITY ANGIOGRAPHY Left 12/17/2021   Procedure: Upper Extremity Angiography;  Surgeon: Jon Cabal, [name];  Location: [place]  CV LAB;  Service: Cardiovascular:: High-grade stenosis just distal to the origin of the L-Subclavian A, normal Innom A & L Carotid A => Ost-Prox L SubCl A 8x37x80 Stent - dilated with 9 mm balloon.   VASECTOMY     Zio Patch Monitor  11/2021   Predom Rhtyhm: SR - HR range 47-98 bpm, AVG 64 bpm.  3 runs of NS VT  (> 4 PVC beats): Fastest: 5 beats - HR 125-162 bpm, ~ 145 bpm, 2.1 sec; Longest with Fastest Avg-12  beats, 150-158 bpm, ~ 155 bpm, 4.6 sec.  1  5 beat Atrial Run: HR 125, Avg 120 bpm,   No Sustained Arrhythmias,   Occasional isolated PACs (2.1%) & Rare isolated PVCs (<1%).  No patient triggered events.    Home Medications:  Allergies as of 07/30/2022       Reactions   Gabapentin Other (See Comments)   Nightmares.    Lisinopril Other (See Comments)   Hypotension   Metformin And Related Diarrhea        Medication List        Accurate as of July 30, 2022 11:33 AM. If you have any questions, ask your nurse or doctor.          Accu-Chek Guide Me w/Device Kit Check glucose three times daily. Dx. Code E11.8   Accu-Chek Guide test strip Generic drug: glucose blood Dx DM E11.9. Check fasting blood sugar every morning.   Accu-Chek Softclix Lancets lancets Use to check blood sugars twice daily   aspirin EC 81 MG tablet Take 1 tablet (81 mg total) by mouth daily. Swallow whole.   atorvastatin 80 MG tablet Commonly known as: LIPITOR Take 1 tablet (80 mg total) by mouth at bedtime.   bisoprolol 5 MG tablet Commonly known as: ZEBETA Take 0.5 tablets (2.5 mg total) by mouth daily.   clopidogrel 75 MG tablet Commonly known as: PLAVIX TAKE 1 TABLET BY MOUTH DAILY   Easy Touch Pen Needles 31G X 8 MM Misc Generic drug: Insulin Pen Needle FOR USE WITH INJECTING  INSULIN DAILY   etodolac 400 MG tablet Commonly known as: Lodine Take 1 tablet (400 mg total) by mouth 2 (two) times daily for 14 days. Started by: Jon Sons, [name]   Fish Oil 1000 MG Caps Take 2 capsules by mouth daily.   lisinopril 2.5 MG tablet Commonly known as: ZESTRIL TAKE 1 TABLET BY MOUTH DAILY   Synjardy 12.5-500 MG Tabs Generic drug: Empagliflozin-metFORMIN HCl TAKE 1 TABLET BY MOUTH  TWICE DAILY   traZODone 50 MG tablet Commonly known as: DESYREL TAKE 1/2 TO 1 TABLET BY  MOUTH AT BEDTIME AS NEEDED  FOR SLEEP   varenicline 1 MG tablet Commonly known as: CHANTIX Take 1 tablet (1 mg  total) by mouth 2 (two) times daily.        Allergies:  Allergies  Allergen Reactions   Gabapentin Other (See Comments)    Nightmares.    Lisinopril Other (See Comments)    Hypotension   Metformin And Related Diarrhea    Family History: Family History  Problem Relation Age of Onset   Diabetes Other        family hx of   Colon cancer Neg Hx     Social History:  reports that he has been smoking cigarettes. He has a 62.00 pack-year smoking history. He has never used smokeless tobacco. He reports that he does not drink alcohol and does not use drugs.   Physical Exam: BP [name])  80/54   Pulse (!) 54   Ht 6\' 1"  (1.854 m)   Wt 200 lb (90.7 kg)   BMI 26.39 kg/m   Constitutional:  Alert and oriented, No acute distress. HEENT: [place] AT Respiratory: Normal respiratory effort, no increased work of breathing. GU: Left testis not palpable, atrophic right testis with tenderness in the right globus minor Psychiatric: Normal mood and affect.  Laboratory Data:  Urinalysis Dipstick/microscopy negative   Assessment & Plan:    Epididymitis Urinalysis normal and not consistent with infectious epididymitis Rx Lodine 400 mg twice daily x two weeks sent to pharmacy  Follow-up as needed for recurrent or persistent pain  Return if symptoms worsen or fail to improve.  I have reviewed the above documentation for accuracy and completeness, and I agree with the above.   Jon [name], [place] [address], [place], [place] [address] [phone]

## 2022-07-31 ENCOUNTER — Emergency Department
Admission: EM | Admit: 2022-07-31 | Discharge: 2022-07-31 | Disposition: A | Discharge status: Home / Self Care | Payer: Medicare Other | Attending: Emergency Medicine | Admitting: Emergency Medicine

## 2022-07-31 DIAGNOSIS — I10 Essential (primary) hypertension: Secondary | ICD-10-CM | POA: Insufficient documentation

## 2022-07-31 DIAGNOSIS — R739 Hyperglycemia, unspecified: Secondary | ICD-10-CM | POA: Diagnosis present

## 2022-07-31 DIAGNOSIS — E1165 Type 2 diabetes mellitus with hyperglycemia: Secondary | ICD-10-CM | POA: Insufficient documentation

## 2022-07-31 LAB — CBG MONITORING, ED: Glucose-Capillary: 431 mg/dL — ABNORMAL HIGH (ref 70–99)

## 2022-07-31 LAB — COMPREHENSIVE METABOLIC PANEL
ALT: 19 U/L (ref 0–44)
AST: 17 U/L (ref 15–41)
Albumin: 3.9 g/dL (ref 3.5–5.0)
Alkaline Phosphatase: 85 U/L (ref 38–126)
Anion gap: 7 (ref 5–15)
BUN: 23 mg/dL (ref 8–23)
CO2: 22 mmol/L (ref 22–32)
Calcium: 8.8 mg/dL — ABNORMAL LOW (ref 8.9–10.3)
Chloride: 104 mmol/L (ref 98–111)
Creatinine, Ser: 1.61 mg/dL — ABNORMAL HIGH (ref 0.61–1.24)
GFR, Estimated: 44 mL/min — ABNORMAL LOW (ref >60)
Glucose, Bld: 464 mg/dL — ABNORMAL HIGH (ref 70–99)
Potassium: 5.1 mmol/L (ref 3.5–5.1)
Sodium: 133 mmol/L — ABNORMAL LOW (ref 135–145)
Total Bilirubin: 0.3 mg/dL (ref 0.3–1.2)
Total Protein: 7.3 g/dL (ref 6.5–8.1)

## 2022-07-31 LAB — CBC
HCT: 41 % (ref 39.0–52.0)
Hemoglobin: 13.6 g/dL (ref 13.0–17.0)
MCH: 32.5 pg (ref 26.0–34.0)
MCHC: 33.2 g/dL (ref 30.0–36.0)
MCV: 98.1 fL (ref 80.0–100.0)
Platelets: 152 10*3/uL (ref 150–400)
RBC: 4.18 MIL/uL — ABNORMAL LOW (ref 4.22–5.81)
RDW: 11.6 % (ref 11.5–15.5)
WBC: 5.6 10*3/uL (ref 4.0–10.5)
nRBC: 0 % (ref 0.0–0.2)

## 2022-07-31 NOTE — ED Provider Notes (Signed)
[facility] Provider Note    Event Date/Time   First MD Initiated Contact with Patient 07/31/22 1540     (approximate)   History   Chief Complaint: Hyperglycemia   HPI  Jon [name] is a 77 y.o. male with a history of type 2 diabetes, hypertension who was sent to the ED for evaluation of high blood sugar.  Patient reports that his blood sugars been running a little bit higher than normal for the past few days.  Usually its 110-130 when he wakes up in the last few days it has been 170-190.  No other symptoms.  He has been compliant with his medication regimen and diet plan.  He tried to see multiple healthcare providers today just to get a comparison fingerstick blood sugar to make sure his glucose monitor was working properly, and then stop needing to go to multiple locations throughout the day.  In the process, he had lunch at [facility] where he was inadvertently given a nondiet soda.  He immediately afterward his blood sugar went much higher.  He then went to urgent care who sent him to the ED for his blood sugar 400 for further evaluation.  He denies any symptoms right now.     Physical Exam   Triage Vital Signs: ED Triage Vitals  Enc Vitals Group     BP 07/31/22 1425 (!) 165/99     Pulse Rate 07/31/22 1425 (!) 51     Resp 07/31/22 1425 17     Temp 07/31/22 1425 98.2 F (36.8 C)     Temp Source 07/31/22 1425 Oral     SpO2 07/31/22 1425 95 %     Weight 07/31/22 1424 210 lb (95.3 kg)     Height --      Head Circumference --      Peak Flow --      Pain Score 07/31/22 1424 0     Pain Loc --      Pain Edu? --      Excl. in [place]? --     Most recent vital signs: Vitals:   07/31/22 1425  BP: (!) 165/99  Pulse: (!) 51  Resp: 17  Temp: 98.2 F (36.8 C)  SpO2: 95%    General: Awake, no distress.  Energetic and lucid CV:  Good peripheral perfusion.  Resp:  Normal effort.  Abd:  No distention.  Other:  Ambulatory, steady gait   ED  Results / Procedures / Treatments   Labs (all labs ordered are listed, but only abnormal results are displayed) Labs Reviewed  CBC - Abnormal; Notable for the following components:      Result Value   RBC 4.18 (*)    All other components within normal limits  COMPREHENSIVE METABOLIC PANEL - Abnormal; Notable for the following components:   Sodium 133 (*)    Glucose, Bld 464 (*)    Creatinine, Ser 1.61 (*)    Calcium 8.8 (*)    GFR, Estimated 44 (*)    All other components within normal limits  CBG MONITORING, ED - Abnormal; Notable for the following components:   Glucose-Capillary 431 (*)    All other components within normal limits     EKG    RADIOLOGY    PROCEDURES:  Procedures   MEDICATIONS ORDERED IN ED: Medications - No data to display   IMPRESSION / MDM / [place] / ED COURSE  I reviewed the triage vital signs and the nursing  notes.  DDx: AKI, electrolyte abnormality, acidosis, hyperglycemia  Patient's presentation is most consistent with acute presentation with potential threat to life or bodily function.  Patient sent to the ED for evaluation of blood sugar of about 400.  Patient gives a very clear linear history of blood sugars overall being fairly well-controlled, and this evening being much higher due to soda intake.  He reports being comfortable managing this at home and bringing it back under control.  Declines any further testing or treatment in the ED and wishes to be discharged right away.  He is stable for follow-up.       FINAL CLINICAL IMPRESSION(S) / ED DIAGNOSES   Final diagnoses:  Hyperglycemia     Rx / DC Orders   ED Discharge Orders     None        Note:  This document was prepared using Dragon voice recognition software and may include unintentional dictation errors.   Carrie Mew, MD 07/31/22 [phone]

## 2022-07-31 NOTE — ED Triage Notes (Signed)
Pt sts that he has been having high BGL for the last four days. Pt sts that he does take insulin. Pt than went to [employer] and get a large Coke and drank it than went to the Urgent care and there meter was elevated and advised him to come and be seen. Pt has been thinking that his machine was not correct.

## 2022-08-01 ENCOUNTER — Ambulatory Visit (INDEPENDENT_AMBULATORY_CARE_PROVIDER_SITE_OTHER): Payer: Medicare Other | Admitting: Family Medicine

## 2022-08-01 DIAGNOSIS — Z0000 Encounter for general adult medical examination without abnormal findings: Secondary | ICD-10-CM

## 2022-08-01 NOTE — Progress Notes (Signed)
MEDICARE ANNUAL WELLNESS VISIT  08/01/2022  Telephone Visit Disclaimer This Medicare AWV was conducted by telephone due to national recommendations for restrictions regarding the COVID-19 Pandemic (e.g. social distancing).  I verified, using two identifiers, that I am speaking with Jon [name] or their authorized healthcare agent. I discussed the limitations, risks, security, and privacy concerns of performing an evaluation and management service by telephone and the potential availability of an in-person appointment in the future. The patient expressed understanding and agreed to proceed.  Location of Patient: Home Location of Provider (nurse): In the office.  Subjective:    Jon [name] is a 77 y.o. male patient of Metheney, Rene Kocher, MD who had a Medicare Annual Wellness Visit today via telephone. Jon [name] is Retired and lives alone. he has 2 children. he reports that he is socially active and does interact with friends/family regularly. he is moderately physically active and enjoys playing golf.  Patient Care Team: [name], MD as PCP - General (Family Medicine) Leonie Man, MD as PCP - Cardiology (Cardiology) Darius Bump, [facility] as Pharmacist (Pharmacist) Talbert Nan, OD as Referring Physician (Optometry)     08/01/2022   10:26 AM 12/17/2021    9:02 AM 11/11/2021   11:23 PM 07/26/2021   10:40 AM 07/25/2020    9:03 AM 12/11/2019    1:19 PM 09/26/2013    4:26 PM  Advanced Directives  Does Patient Have a Medical Advance Directive? Yes No No Yes Yes No Patient would not like information;Patient does not have advance directive  Type of Advance Directive Living will   Living will Living will    Does patient want to make changes to medical advance directive? No - Patient declined   No - Patient declined No - Patient declined    Would patient like information on creating a medical advance directive?  No - Patient declined    No - Patient declined      Hospital Utilization Over the Past 12 Months: # of hospitalizations or ER visits: 4 # of surgeries: 0  Review of Systems    Patient reports that his overall health is worse compared to last year.  History obtained from chart review and the patient  Patient Reported Readings (BP, Pulse, CBG, Weight, etc) none  Pain Assessment Pain : No/denies pain Pain Score: 0-No pain     Current Medications & Allergies (verified) Allergies as of 08/01/2022       Reactions   Gabapentin Other (See Comments)   Nightmares.    Lisinopril Other (See Comments)   Hypotension   Metformin And Related Diarrhea        Medication List        Accurate as of August 01, 2022 10:53 AM. If you have any questions, ask your nurse or doctor.          Accu-Chek Guide Me w/Device Kit Check glucose three times daily. Dx. Code E11.8   Accu-Chek Guide test strip Generic drug: glucose blood Dx DM E11.9. Check fasting blood sugar every morning.   Accu-Chek Softclix Lancets lancets Use to check blood sugars twice daily   aspirin EC 81 MG tablet Take 1 tablet (81 mg total) by mouth daily. Swallow whole.   atorvastatin 80 MG tablet Commonly known as: LIPITOR Take 1 tablet (80 mg total) by mouth at bedtime.   bisoprolol 5 MG tablet Commonly known as: ZEBETA Take 0.5 tablets (2.5 mg total) by mouth daily.   clopidogrel 75  MG tablet Commonly known as: PLAVIX TAKE 1 TABLET BY MOUTH DAILY   Easy Touch Pen Needles 31G X 8 MM Misc Generic drug: Insulin Pen Needle FOR USE WITH INJECTING  INSULIN DAILY   etodolac 400 MG tablet Commonly known as: Lodine Take 1 tablet (400 mg total) by mouth 2 (two) times daily for 14 days.   Fish Oil 1000 MG Caps Take 2 capsules by mouth daily.   Levemir FlexPen 100 UNIT/ML FlexPen Generic drug: insulin detemir Inject into the skin.   lisinopril 2.5 MG tablet Commonly known as: ZESTRIL TAKE 1 TABLET BY MOUTH DAILY   Synjardy 12.5-500 MG Tabs Generic  drug: Empagliflozin-metFORMIN HCl TAKE 1 TABLET BY MOUTH  TWICE DAILY   traZODone 50 MG tablet Commonly known as: DESYREL TAKE 1/2 TO 1 TABLET BY  MOUTH AT BEDTIME AS NEEDED  FOR SLEEP   varenicline 1 MG tablet Commonly known as: CHANTIX Take 1 tablet (1 mg total) by mouth 2 (two) times daily.        History (reviewed): Past Medical History:  Diagnosis Date   BENIGN POSITIONAL VERTIGO 05/20/2010   Qualifier: Diagnosis of  By: Madilyn Fireman MD, Reyes Ivan emphysema [facility])    Cerebrovascular disease 07/15/2018   Prior CVA & TIA. Carotid CTA ~50% Bilateral ICA,  Multifocal severe stenosis of the left V2 vertebral artery.  Right vertebral artery patent without significant stenosis.   Coronary artery disease involving native coronary artery with angina pectoris ([place]) 11/18/2017   Severe LM, LAD & RCA stenosis indicated on Coronary CTA => CaCardiac Cath 01/02/2022: dLM ~50%; mLAD 85%@D1  90% (1,1,1) - m-dLAD 90$ @ 65% D2 (0,1,1), RI 75%, pLCx 30%-OM1 60%, pRCA 25% - p-mRCA hazy 85%. Normal LVEF by Echo, EDP 16 mmHg. = Best option CABGrdiac Cath    Current every day smoker    No interest in quitting   Diabetes mellitus, type II, insulin dependent ([place])    With peripheral neuropathy, peripheral vascular disease and coronary artery disease as complications.   Epididymitis    Hyperlipemia    Hypertension    Stroke ([place]) 11/2009   Subclavian steal syndrome of left subclavian artery 11/19/2021   Severe stenosis (already seen on CT angiogram (symptoms of left arm claudication, cold left arm, subclavian steal neurologic symptoms. => 12/17/2021: L Subclavia A PTA=Stent with resolution of Sx.   TIA (transient ischemic attack) 11/11/2021   Brief episode of left sided arm pain and slurred speech   Past Surgical History:  Procedure Laterality Date   LEFT HEART CATH AND CORONARY ANGIOGRAPHY N/A 01/02/2022   Procedure: LEFT HEART CATH AND CORONARY ANGIOGRAPHY;  Surgeon: Leonie Man,  MD;  Location: [facility] INVASIVE CV LAB:: dLM ~50%; mLAD 85%@D1  90% (1,1,1) - m-dLAD 90$ @ 65% D2 (0,1,1), RI 75%, pLCx 30%-OM1 60%, pRCA 25% - p-mRCA hazy 85%. Normal LVEF by Echo, EDP 16 mmHg. = Best option CABG   PILONIDAL CYST EXCISION  1960, 61, 62, 63   TRANSTHORACIC ECHOCARDIOGRAM  11/28/2021   EF 55 to 60%.  No RWMA.  Moderate asymmetric LVH-basal septal.  GR 1 DD.  Normal RV.  Normal MV.  AOV sclerosis without stenosis.  Normal RAP.   UPPER EXTREMITY ANGIOGRAPHY Left 12/17/2021   Procedure: Upper Extremity Angiography;  Surgeon: Katha Cabal, MD;  Location: [place] [facility];  Service: Cardiovascular:: High-grade stenosis just distal to the origin of the L-Subclavian A, normal Innom A & L Carotid A => Ost-Prox L SubCl A [id number]  Stent - dilated with 9 mm balloon.   VASECTOMY     Zio Patch Monitor  11/2021   Predom Rhtyhm: SR - HR range 47-98 bpm, AVG 64 bpm.  3 runs of NS VT  (> 4 PVC beats): Fastest: 5 beats - HR 125-162 bpm, ~ 145 bpm, 2.1 sec; Longest with Fastest Avg-12 beats, 150-158 bpm, ~ 155 bpm, 4.6 sec.  1  5 beat Atrial Run: HR 125, Avg 120 bpm,   No Sustained Arrhythmias,   Occasional isolated PACs (2.1%) & Rare isolated PVCs (<1%).  No patient triggered events.   Family History  Problem Relation Age of Onset   Diabetes Other        family hx of   Colon cancer Neg Hx    Social History   Socioeconomic History   Marital status: Divorced    Spouse name: Not on file   Number of children: 2   Years of education: 12th grade   Highest education level: High school graduate  Occupational History   Occupation: Retired  Tobacco Use   Smoking status: Former    Packs/day: 1.00    Years: 62.00    Additional pack years: 0.00    Total pack years: 62.00    Types: Cigarettes    Quit date: 01/12/2022    Years since quitting: 0.5   Smokeless tobacco: Never  Vaping Use   Vaping Use: Never used  Substance and Sexual Activity   Alcohol use: No   Drug use: No   Sexual  activity: Not on file  Other Topics Concern   Not on file  Social History Narrative   Lives alone and Likes to play golf 2 x 3 times a week.   Social Determinants of Health   Financial Resource Strain: Low Risk  (08/01/2022)   Overall Financial Resource Strain (CARDIA)    Difficulty of Paying Living Expenses: Not hard at all  Food Insecurity: No Food Insecurity (08/01/2022)   Hunger Vital Sign    Worried About Running Out of Food in the Last Year: Never true    Ran Out of Food in the Last Year: Never true  Transportation Needs: No Transportation Needs (08/01/2022)   PRAPARE - Hydrologist (Medical): No    Lack of Transportation (Non-Medical): No  Physical Activity: Inactive (08/01/2022)   Exercise Vital Sign    Days of Exercise per Week: 0 days    Minutes of Exercise per Session: 0 min  Stress: No Stress Concern Present (08/01/2022)   [place]    Feeling of Stress : Not at all  Social Connections: Socially Isolated (08/01/2022)   Social Connection and Isolation Panel [NHANES]    Frequency of Communication with Friends and Family: Three times a week    Frequency of Social Gatherings with Friends and Family: Once a week    Attends Religious Services: Never    Marine scientist or Organizations: No    Attends Archivist Meetings: Never    Marital Status: Divorced    Activities of Daily Living    08/01/2022   10:28 AM 01/02/2022   11:06 AM  In your present state of health, do you have any difficulty performing the following activities:  Hearing? 0 0  Vision? 0 0  Difficulty concentrating or making decisions? 1 0  Comment some memory loss   Walking or climbing stairs? 0 0  Dressing or bathing? 0 0  Doing errands, shopping? 0   Preparing Food and eating ? N   Using the Toilet? N   In the past six months, have you accidently leaked urine? N   Do you have problems with  loss of bowel control? N   Managing your Medications? N   Managing your Finances? N   Housekeeping or managing your Housekeeping? N     Patient Education/ Literacy How often do you need to have someone help you when you read instructions, pamphlets, or other written materials from your doctor or pharmacy?: 1 - Never What is the last grade level you completed in school?: 12th grade  Exercise Current Exercise Habits: The patient does not participate in regular exercise at present, Exercise limited by: None identified  Diet Patient reports consuming 2 meals a day and 2-3 snack(s) a day Patient reports that his primary diet is: Regular Patient reports that she does have regular access to food.   Depression Screen    08/01/2022   10:26 AM 01/20/2022   11:15 AM 07/26/2021   10:40 AM 07/25/2020    9:04 AM 12/07/2019   11:43 AM 09/07/2019   11:51 AM 07/15/2018    9:40 AM  PHQ 2/9 Scores  PHQ - 2 Score 0 0 0 0 0 0 0  PHQ- 9 Score    0 0  0     Fall Risk    08/01/2022   10:26 AM 01/20/2022   11:14 AM 07/26/2021   10:40 AM 07/25/2020    9:04 AM 12/07/2019   11:43 AM  Fall Risk   Falls in the past year? 0 0 0 0 0  Number falls in past yr: 0 0 0 0 0  Injury with Fall? 0 0 0 0 0  Risk for fall due to : No Fall Risks No Fall Risks No Fall Risks No Fall Risks   Follow up Falls evaluation completed Falls evaluation completed Falls evaluation completed Falls evaluation completed Falls evaluation completed     Objective:  Jon [name] seemed alert and oriented and he participated appropriately during our telephone visit.  Blood Pressure Weight BMI  BP Readings from Last 3 Encounters:  07/31/22 (!) 165/99  07/30/22 (!) 80/54  05/15/22 129/60   Wt Readings from Last 3 Encounters:  07/31/22 210 lb (95.3 kg)  07/30/22 200 lb (90.7 kg)  05/15/22 205 lb (93 kg)   BMI Readings from Last 1 Encounters:  07/31/22 27.71 kg/m    *Unable to obtain current vital signs, weight, and BMI due  to telephone visit type  Hearing/Vision  Jon [name] did not seem to have difficulty with hearing/understanding during the telephone conversation Reports that he has had a formal eye exam by an eye care professional within the past year Reports that he has not had a formal hearing evaluation within the past year *Unable to fully assess hearing and vision during telephone visit type  Cognitive Function:    08/01/2022   10:30 AM 07/26/2021   10:44 AM 07/25/2020    9:08 AM  6CIT Screen  What Year? 0 points 0 points 0 points  What month? 0 points 0 points 0 points  What time? 0 points 0 points 0 points  Count back from 20 0 points 0 points 0 points  Months in reverse 0 points 0 points 0 points  Repeat phrase 0 points 0 points 0 points  Total Score 0 points 0 points 0 points   (Normal:0-7, Significant for Dysfunction: >8)  Normal  Cognitive Function Screening: Yes   Immunization & Health Maintenance Record Immunization History  Administered Date(s) Administered   Fluad Quad(high Dose 65+) 02/09/2019, 03/07/2020, 03/20/2021, 01/20/2022   Influenza Split 03/06/2011, 03/05/2012   Influenza Whole 04/14/2009   Influenza, High Dose Seasonal PF 03/26/2016, 01/27/2017, 02/09/2018   Influenza,inj,Quad PF,6+ Mos 03/09/2013, 12/27/2013, 03/21/2015   PFIZER(Purple Top)SARS-COV-2 Vaccination 09/02/2019, 09/27/2019, 04/10/2020   Pneumococcal Conjugate-13 09/26/2014   Pneumococcal Polysaccharide-23 03/15/2009, 12/03/2012   Tdap 05/12/2008, 05/21/2018    Health Maintenance  Topic Date Due   COVID-19 Vaccine (4 - 2023-24 season) 08/17/2022 (Originally 01/10/2022)   Zoster Vaccines- Shingrix (1 of 2) 05/16/2023 (Originally 05/19/1964)   COLONOSCOPY (Pts 45-49yrs Insurance coverage will need to be confirmed)  08/01/2023 (Originally 07/29/2016)   HEMOGLOBIN A1C  11/13/2022   Lung Cancer Screening  11/19/2022   Diabetic kidney evaluation - Urine ACR  05/16/2023   FOOT EXAM  05/16/2023   OPHTHALMOLOGY EXAM   06/25/2023   Diabetic kidney evaluation - eGFR measurement  07/31/2023   Medicare Annual Wellness (AWV)  08/01/2023   DTaP/Tdap/Td (3 - Td or Tdap) 05/21/2028   Pneumonia Vaccine 85+ Years old  Completed   INFLUENZA VACCINE  Completed   Hepatitis C Screening  Completed   HPV VACCINES  Aged Out       Assessment  This is a routine wellness examination for Jon [name].  Health Maintenance: Due or Overdue There are no preventive care reminders to display for this patient.   Jon [name] does not need a referral for Community Assistance: Care Management:   no Social Work:    no Prescription Assistance:  no Nutrition/Diabetes Education:  no   Plan:  Personalized Goals  Goals Addressed               This Visit's Progress     Patient Stated (pt-stated)        08/01/2022 AWV Goal: Diabetes Management  Patient will maintain an A1C level below 8.0 Patient will not develop any diabetic foot complications Patient will not experience any hypoglycemic episodes over the next 3 months Patient will notify our office of any CBG readings outside of the provider recommended range by calling [phone] Patient will adhere to provider recommendations for diabetes management  Patient Self Management Activities take all medications as prescribed and report any negative side effects monitor and record blood sugar readings as directed adhere to a low carbohydrate diet that incorporates lean proteins, vegetables, whole grains, low glycemic fruits check feet daily noting any sores, cracks, injuries, or callous formations see PCP or podiatrist if he notices any changes in his legs, feet, or toenails Patient will visit PCP and have an A1C level checked every 3 to 6 months as directed  have a yearly eye exam to monitor for vascular changes associated with diabetes and will request that the report be sent to his pcp.  consult with his PCP regarding any changes in his health or new  or worsening symptoms        Personalized Health Maintenance & Screening Recommendations  Colorectal cancer screening Shingles vaccine   Lung Cancer Screening Recommended: yes; due in July.  (Low Dose CT Chest recommended if Age 11-80 years, 30 pack-year currently smoking OR have quit w/in past 15 years) Hepatitis C Screening recommended: no HIV Screening recommended: no  Advanced Directives: Written information was not prepared per patient's request.  Referrals & Orders No orders of the defined types were placed in this encounter.   Follow-up  Plan Follow-up with [name], MD as planned Patient will update if he changes his mind about Colonoscopy Schedule shingles vaccine at the pharmacy. Medicare wellness visit in one year.  Patient will access AVS on my chart.   I have personally reviewed and noted the following in the patient's chart:   Medical and social history Use of alcohol, tobacco or illicit drugs  Current medications and supplements Functional ability and status Nutritional status Physical activity Advanced directives List of other physicians Hospitalizations, surgeries, and ER visits in previous 12 months Vitals Screenings to include cognitive, depression, and falls Referrals and appointments  In addition, I have reviewed and discussed with Jon [name] certain preventive protocols, quality metrics, and best practice recommendations. A written personalized care plan for preventive services as well as general preventive health recommendations is available and can be mailed to the patient at his request.      Tinnie Gens, RN BSN  08/01/2022

## 2022-08-01 NOTE — Patient Instructions (Addendum)
[place] Maintenance Summary and Written Plan of Care  Jon [name] ,  Thank you for allowing me to perform your Medicare Annual Wellness Visit and for your ongoing commitment to your health.   Health Maintenance & Immunization History Health Maintenance  Topic Date Due   COVID-19 Vaccine (4 - 2023-24 season) 08/17/2022 (Originally 01/10/2022)   Zoster Vaccines- Shingrix (1 of 2) 05/16/2023 (Originally 05/19/1964)   COLONOSCOPY (Pts 45-64yrs Insurance coverage will need to be confirmed)  08/01/2023 (Originally 07/29/2016)   HEMOGLOBIN A1C  11/13/2022   Lung Cancer Screening  11/19/2022   Diabetic kidney evaluation - Urine ACR  05/16/2023   FOOT EXAM  05/16/2023   OPHTHALMOLOGY EXAM  06/25/2023   Diabetic kidney evaluation - eGFR measurement  07/31/2023   Medicare Annual Wellness (AWV)  08/01/2023   DTaP/Tdap/Td (3 - Td or Tdap) 05/21/2028   Pneumonia Vaccine 43+ Years old  Completed   INFLUENZA VACCINE  Completed   Hepatitis C Screening  Completed   HPV VACCINES  Aged Out   Immunization History  Administered Date(s) Administered   Fluad Quad(high Dose 65+) 02/09/2019, 03/07/2020, 03/20/2021, 01/20/2022   Influenza Split 03/06/2011, 03/05/2012   Influenza Whole 04/14/2009   Influenza, High Dose Seasonal PF 03/26/2016, 01/27/2017, 02/09/2018   Influenza,inj,Quad PF,6+ Mos 03/09/2013, 12/27/2013, 03/21/2015   PFIZER(Purple Top)SARS-COV-2 Vaccination 09/02/2019, 09/27/2019, 04/10/2020   Pneumococcal Conjugate-13 09/26/2014   Pneumococcal Polysaccharide-23 03/15/2009, 12/03/2012   Tdap 05/12/2008, 05/21/2018    These are the patient goals that we discussed:  Goals Addressed               This Visit's Progress     Patient Stated (pt-stated)        08/01/2022 AWV Goal: Diabetes Management  Patient will maintain an A1C level below 8.0 Patient will not develop any diabetic foot complications Patient will not experience any hypoglycemic episodes over  the next 3 months Patient will notify our office of any CBG readings outside of the provider recommended range by calling [phone] Patient will adhere to provider recommendations for diabetes management  Patient Self Management Activities take all medications as prescribed and report any negative side effects monitor and record blood sugar readings as directed adhere to a low carbohydrate diet that incorporates lean proteins, vegetables, whole grains, low glycemic fruits check feet daily noting any sores, cracks, injuries, or callous formations see PCP or podiatrist if he notices any changes in his legs, feet, or toenails Patient will visit PCP and have an A1C level checked every 3 to 6 months as directed  have a yearly eye exam to monitor for vascular changes associated with diabetes and will request that the report be sent to his pcp.  consult with his PCP regarding any changes in his health or new or worsening symptoms          This is a list of Health Maintenance Items that are overdue or due now: Colorectal cancer screening-Declined  Shingles vaccine  Lung cancer screening - due in July  Orders/Referrals Placed Today: No orders of the defined types were placed in this encounter.  (Contact our referral department at [phone] if you have not spoken with someone about your referral appointment within the next 5 days)    Follow-up Plan Follow-up with [name], MD as planned Patient will update if he changes his mind about Colonoscopy Schedule shingles vaccine at the pharmacy. Medicare wellness visit in one year.  Patient will access AVS on my chart.  Health Maintenance, Male Adopting a healthy lifestyle and getting preventive care are important in promoting health and wellness. Ask your health care provider about: The right schedule for you to have regular tests and exams. Things you can do on your own to prevent diseases and keep yourself  healthy. What should I know about diet, weight, and exercise? Eat a healthy diet  Eat a diet that includes plenty of vegetables, fruits, low-fat dairy products, and lean protein. Do not eat a lot of foods that are high in solid fats, added sugars, or sodium. Maintain a healthy weight Body mass index (BMI) is a measurement that can be used to identify possible weight problems. It estimates body fat based on height and weight. Your health care provider can help determine your BMI and help you achieve or maintain a healthy weight. Get regular exercise Get regular exercise. This is one of the most important things you can do for your health. Most adults should: Exercise for at least 150 minutes each week. The exercise should increase your heart rate and make you sweat (moderate-intensity exercise). Do strengthening exercises at least twice a week. This is in addition to the moderate-intensity exercise. Spend less time sitting. Even light physical activity can be beneficial. Watch cholesterol and blood lipids Have your blood tested for lipids and cholesterol at 76 years of age, then have this test every 5 years. You may need to have your cholesterol levels checked more often if: Your lipid or cholesterol levels are high. You are older than 76 years of age. You are at high risk for heart disease. What should I know about cancer screening? Many types of cancers can be detected early and may often be prevented. Depending on your health history and family history, you may need to have cancer screening at various ages. This may include screening for: Colorectal cancer. Prostate cancer. Skin cancer. Lung cancer. What should I know about heart disease, diabetes, and high blood pressure? Blood pressure and heart disease High blood pressure causes heart disease and increases the risk of stroke. This is more likely to develop in people who have high blood pressure readings or are overweight. Talk with  your health care provider about your target blood pressure readings. Have your blood pressure checked: Every 3-5 years if you are 82-38 years of age. Every year if you are 71 years old or older. If you are between the ages of 64 and 41 and are a current or former smoker, ask your health care provider if you should have a one-time screening for abdominal aortic aneurysm (AAA). Diabetes Have regular diabetes screenings. This checks your fasting blood sugar level. Have the screening done: Once every three years after age 78 if you are at a normal weight and have a low risk for diabetes. More often and at a younger age if you are overweight or have a high risk for diabetes. What should I know about preventing infection? Hepatitis B If you have a higher risk for hepatitis B, you should be screened for this virus. Talk with your health care provider to find out if you are at risk for hepatitis B infection. Hepatitis C Blood testing is recommended for: Everyone born from [birth year] through 1964. Anyone with known risk factors for hepatitis C. Sexually transmitted infections (STIs) You should be screened each year for STIs, including gonorrhea and chlamydia, if: You are sexually active and are younger than 76 years of age. You are older than 76 years of age and  your health care provider tells you that you are at risk for this type of infection. Your sexual activity has changed since you were last screened, and you are at increased risk for chlamydia or gonorrhea. Ask your health care provider if you are at risk. Ask your health care provider about whether you are at high risk for HIV. Your health care provider may recommend a prescription medicine to help prevent HIV infection. If you choose to take medicine to prevent HIV, you should first get tested for HIV. You should then be tested every 3 months for as long as you are taking the medicine. Follow these instructions at home: Alcohol use Do not drink  alcohol if your health care provider tells you not to drink. If you drink alcohol: Limit how much you have to 0-2 drinks a day. Know how much alcohol is in your drink. In the [place] one drink equals one 12 oz bottle of beer (355 mL), one 5 oz glass of wine (148 mL), or one 1 oz glass of hard liquor (44 mL). Lifestyle Do not use any products that contain nicotine or tobacco. These products include cigarettes, chewing tobacco, and vaping devices, such as e-cigarettes. If you need help quitting, ask your health care provider. Do not use street drugs. Do not share needles. Ask your health care provider for help if you need support or information about quitting drugs. General instructions Schedule regular health, dental, and eye exams. Stay current with your vaccines. Tell your health care provider if: You often feel depressed. You have ever been abused or do not feel safe at home. Summary Adopting a healthy lifestyle and getting preventive care are important in promoting health and wellness. Follow your health care provider's instructions about healthy diet, exercising, and getting tested or screened for diseases. Follow your health care provider's instructions on monitoring your cholesterol and blood pressure. This information is not intended to replace advice given to you by your health care provider. Make sure you discuss any questions you have with your health care provider. Document Revised: 09/17/2020 Document Reviewed: 09/17/2020 Elsevier Patient Education  [employer].

## 2022-08-04 ENCOUNTER — Encounter: Payer: Self-pay | Admitting: Family Medicine

## 2022-08-04 ENCOUNTER — Ambulatory Visit (INDEPENDENT_AMBULATORY_CARE_PROVIDER_SITE_OTHER): Payer: Medicare Other | Admitting: Family Medicine

## 2022-08-04 VITALS — BP 108/59 | HR 62 | Ht 73.0 in | Wt 218.0 lb

## 2022-08-04 DIAGNOSIS — I10 Essential (primary) hypertension: Secondary | ICD-10-CM

## 2022-08-04 DIAGNOSIS — F515 Nightmare disorder: Secondary | ICD-10-CM | POA: Insufficient documentation

## 2022-08-04 DIAGNOSIS — E118 Type 2 diabetes mellitus with unspecified complications: Secondary | ICD-10-CM | POA: Diagnosis not present

## 2022-08-04 LAB — GLUCOSE, POCT (MANUAL RESULT ENTRY): POC Glucose: 223 mg/dl — AB (ref 70–99)

## 2022-08-04 MED ORDER — TRAZODONE HCL 50 MG PO TABS: 75.0000 mg | ORAL_TABLET | Freq: Every day | ORAL | 1 refills | Status: DC

## 2022-08-04 NOTE — Assessment & Plan Note (Signed)
Trazodone has been really helpful in helping him sleep as well as reducing his nightmares but he has had an increase in symptoms recently.  He says he will fall asleep and then wakes up in the melanite and has a hard time falling asleep and then once he falls back asleep he starts to have nightmares again.  Will try increasing dose to 75 mg nightly.  New prescription sent to mail order.

## 2022-08-04 NOTE — Progress Notes (Unsigned)
Pt reports that his BS have been running around 160-180 for the past 7-10 days. He stated that on Friday he stopped for lunch and asked for a Diet Coke and was given a regular Coke and his BS shot up to around 484. He said that he went to the ED in [place] to be evaluated for his elevated BS.   He didn't say if he was experiencing any sxs of the elevation of his blood sugar. He just went to the ED for them to check him out because of this.    Going over his medication list he informed me that he increased his Levemir to 50 U 2 days ago. He was previously taking 40 U. I asked him what his BS was this morning he thinks it was around 170.   Pt also informed me that he was seen by the Urologist last Thursday for Epididymitis and was given Etodolac 400 mg BID x 14 days. He doesn't feel like this is helping. He is on his 3rd day.   He also wanted her to know that the Trazodone 50 mg is not working as well as it was when he started this 6 mos ago. I tried to explain to him that this was an ED follow up.

## 2022-08-04 NOTE — Progress Notes (Unsigned)
Established Patient Office Visit  Subjective   Patient ID: Jon [name], male    DOB: 1945-12-30  Age: 77 y.o. MRN: [id number]  Chief Complaint  Patient presents with   Follow-up    Elevated BS Jon [name] last ate this AM about 1100    HPI  Pt reports that his BS have been running around 160-180 for the past 7-10 days. Jon [name] stated that on Friday Jon [name] stopped for lunch and asked for a Diet Coke and was given a regular Coke and his BS shot up to around 484. Jon [name] said that Jon [name] went to the ED in [place] to be evaluated for his elevated BS.    Jon [name] didn't say if Jon [name] was experiencing any sxs of the elevation of his blood sugar. Jon [name] just went to the ED for them to check him out because of this.     Going over his medication list Jon [name] informed me that Jon [name] increased his Levemir to 50 U 2 days ago. Jon [name] was previously taking 40 U. I asked him what his BS was this morning Jon [name] thinks it was around 170.    Pt also informed me that Jon [name] was seen by the Urologist last Thursday for Epididymitis and was given Etodolac 400 mg BID x 14 days. Jon [name] doesn't feel like this is helping. Jon [name] is on his 3rd day.    Jon [name] also wanted her to know that the Trazodone 50 mg is not working as well as it was when Jon [name] started this 6 mos ago. I tried to explain to him that this was an ED follow up.   Jon [name] also reports that Jon [name] quit smoking in September and since then has been wanting to eat more and has gained about 18 pounds particularly in the last couple of months.  His blood sugars were previously running between 110 and 130 up until about a month ago.    {History (Optional):23778}  ROS    Objective:     BP (!) 108/59   Pulse 62   Ht 6\' 1"  (1.854 m)   Wt 218 lb (98.9 kg)   SpO2 98%   BMI 28.76 kg/m  {Vitals History (Optional):23777}  Physical Exam Vitals reviewed.  Constitutional:      Appearance: Jon [name] is well-developed.  HENT:     Head: Normocephalic and atraumatic.  Eyes:     Conjunctiva/sclera: Conjunctivae normal.   Cardiovascular:     Rate and Rhythm: Normal rate.  Pulmonary:     Effort: Pulmonary effort is normal.  Skin:    General: Skin is dry.     Coloration: Skin is not pale.  Neurological:     Mental Status: Jon [name] is alert and oriented to person, place, and time.  Psychiatric:        Behavior: Behavior normal.      Results for orders placed or performed in visit on 08/04/22  POCT glucose (manual entry)  Result Value Ref Range   POC Glucose 223 (A) 70 - 99 mg/dl    {Labs (Optional):23779}  The ASCVD Risk score (Arnett DK, et al., 2019) failed to calculate for the following reasons:   The patient has a prior MI or stroke diagnosis    Assessment & Plan:   Problem List Items Addressed This Visit       Cardiovascular and Mediastinum   Essential hypertension (Chronic)    1 pressure looks great today if anything is on the lower side.  Jon [name] does have subclavian arterial stenosis which affects  his blood pressure.        Endocrine   Controlled diabetes mellitus type 2 with complications ([place]) - Primary (Chronic)    Go up on your Levemir to 55 units at night.   Cut back on your carbs, breads, pasta, rice, sweets, cut back on things like jelly and gravy.  Try to eat more vegetables.  Try to cut back on your portions a little bit.  And lets see if your blood sugars start coming back down under 150.   I am going to work on getting a new medication that would be once a week shot to help with your blood sugars. I do think the recent jump in blood sugars could be connected to the fact that Jon [name] is gained 18 pounds since quitting smoking though I am extremely proud of him for quitting.  They did do a blood count in the ED which is reassuring that Jon [name] does not have any type of systemic effect infection that could be affecting his blood sugar levels.      Relevant Orders   POCT glucose (manual entry) (Completed)     Nervous and Auditory   Nightmares    Trazodone has been really helpful in helping  him sleep as well as reducing his nightmares but Jon [name] has had an increase in symptoms recently.  Jon [name] says Jon [name] will fall asleep and then wakes up in the melanite and has a hard time falling asleep and then once Jon [name] falls back asleep Jon [name] starts to have nightmares again.  Will try increasing dose to 75 mg nightly.  New prescription sent to mail order.      Relevant Medications   traZODone (DESYREL) 50 MG tablet   Other Visit Diagnoses     Hypocalcemia           Return in about 1 month (around 09/04/2022) for Diabetes follow-up.    Beatrice Lecher, MD

## 2022-08-04 NOTE — Patient Instructions (Signed)
Go up on your Levemir to 55 units at night.   Cut back on your carbs, breads, pasta, rice, sweets, cut back on things like jelly and gravy.  Try to eat more vegetables.  Try to cut back on your portions a little bit.  And lets see if your blood sugars start coming back down under 150.   I am going to work on getting a new medication that would be once a week shot to help with your blood sugars.

## 2022-08-04 NOTE — Assessment & Plan Note (Signed)
Go up on your Levemir to 55 units at night.   Cut back on your carbs, breads, pasta, rice, sweets, cut back on things like jelly and gravy.  Try to eat more vegetables.  Try to cut back on your portions a little bit.  And lets see if your blood sugars start coming back down under 150.   I am going to work on getting a new medication that would be once a week shot to help with your blood sugars. I do think the recent jump in blood sugars could be connected to the fact that he is gained 18 pounds since quitting smoking though I am extremely proud of him for quitting.  They did do a blood count in the ED which is reassuring that he does not have any type of systemic effect infection that could be affecting his blood sugar levels.

## 2022-08-04 NOTE — Assessment & Plan Note (Addendum)
1 pressure looks great today if anything is on the lower side.  He does have subclavian arterial stenosis which affects his blood pressure.

## 2022-08-06 ENCOUNTER — Telehealth: Payer: Self-pay | Admitting: *Deleted

## 2022-08-06 ENCOUNTER — Encounter: Payer: Self-pay | Admitting: *Deleted

## 2022-08-06 ENCOUNTER — Ambulatory Visit: Payer: Self-pay

## 2022-08-06 NOTE — Transitions of Care (Post Inpatient/ED Visit) (Signed)
08/06/2022  Name: Jon [name] MRN: [id number] DOB: 19-Feb-1946  Today's TOC FU Call Status: Today's TOC FU Call Status:: Successful TOC FU Call Competed TOC FU Call Complete Date: 08/06/22  ED EMMI Red Alert notification on 08/04/22 from ED visit 07/31/22- EMMI call placed 08/02/22: "No scheduled follow up:"  Verified patient had and attended as scheduled ED follow up PCP appointment 08/04/22  Transition Care Management Follow-up Telephone Call Date of Discharge: 07/31/22 Discharge Facility: [facility]) Type of Discharge: Emergency Department Reason for ED Visit: Endocrine Endocrine Diagnosis: Uncontrolled Diabetes (transient high blood sugar) How have you been since you were released from the hospital?: Better ("Since I saw my doctor on Monday and she increased my insulin dose, my blood sugars are back to normal and I am not having any more issues.  This whole issue happended because they did not give me diet soda at [employer] that day") Any questions or concerns?: No  Items Reviewed: Did you receive and understand the discharge instructions provided?: Yes (thoroughly reviewed with patient who verbalizes excellent understanding of same) Medications obtained and verified?: Yes (Medications Reviewed) (Partial medication review completed per patient request; confirmed patient is taking all new insulin dose as instructed post- PCP appointment on 08/04/22; self-manages medications and denies questions/ concerns around medications today) Any new allergies since your discharge?: No Dietary orders reviewed?: Yes Type of Diet Ordered:: "Diabetic" Do you have support at home?:  (unable to determine- patient declined full TOC questions, states he is out running an errand and is unable to talk for long)  Home Care and Equipment/Supplies: [place] Ordered?: NA Any new equipment or medical supplies ordered?: NA  Functional Questionnaire: Do you need  assistance with bathing/showering or dressing?: No Do you need assistance with meal preparation?: No Do you need assistance with eating?: No Do you have difficulty maintaining continence: No Do you need assistance with getting out of bed/getting out of a chair/moving?: No Do you have difficulty managing or taking your medications?: No  Follow up appointments reviewed: PCP Follow-up appointment confirmed?: Yes Date of PCP follow-up appointment?: 08/04/22 (verified patient attended as scheduled post- recent ED visit) Follow-up Provider: PCP [facility] Follow-up appointment confirmed?: NA Do you need transportation to your follow-up appointment?: No Do you understand care options if your condition(s) worsen?: Yes-patient verbalized understanding  SDOH Interventions Today    Flowsheet Row Most Recent Value  SDOH Interventions   Transportation Interventions Intervention Not Indicated  [drives self]      TOC Interventions Today    Flowsheet Row Most Recent Value  TOC Interventions   TOC Interventions Discussed/Reviewed TOC Interventions Discussed  [Patient declines need for ongoing/ further care coordination outreach,  no care coordination needs identified at time of TOC call today,  provided my direct contact information should questions/ concerns/ needs arise post-TOC call]      Interventions Today    Flowsheet Row Most Recent Value  Chronic Disease   Chronic disease during today's visit Diabetes  General Interventions   General Interventions Discussed/Reviewed General Interventions Discussed, Labs, Doctor Visits  [Reviewed recent blood sugars at home since ED visit and PCP visit on 08/04/22: reports blood sugar this morning of "128, " states all blood sugars post- ED visit have been "normal"]  Labs Hgb A1c every 3 months  Doctor Visits Discussed/Reviewed Doctor Visits Discussed, Doctor Visits Reviewed, PCP  PCP/Specialist Visits Compliance with follow-up visit  Nutrition  Interventions   Nutrition Discussed/Reviewed Nutrition Discussed, Decreasing  sugar intake  Pharmacy Interventions   Pharmacy Dicussed/Reviewed Pharmacy Topics Discussed      Oneta Rack, RN, BSN, CCRN Alumnus RN CM Care Coordination/ Transition of [place] Management [phone]: direct office

## 2022-08-06 NOTE — Chronic Care Management (AMB) (Signed)
   08/06/2022  Zaidon Higa 05-Dec-1945 [id number]   Reason for Encounter: Patient is not currently enrolled in the CCM program. CCM status changed to previously enrolled.   Horris Latino RN Care Manager/Chronic Care Management [phone]

## 2022-08-07 ENCOUNTER — Telehealth: Payer: Self-pay

## 2022-08-07 ENCOUNTER — Other Ambulatory Visit: Payer: Self-pay

## 2022-08-07 DIAGNOSIS — L608 Other nail disorders: Secondary | ICD-10-CM

## 2022-08-07 NOTE — Telephone Encounter (Signed)
Patient called-  states he saw Dr. Madilyn Fireman  a couple of days ago and forgot to tell her about  his toenail of left foot is [place]/ black x 10 days.  His podiatrist in [place] told him to see dermatology for this . He is requesting a referral to dermatology in [place] initially but when [facility] was recommended he states he was told they have a year wait for appt. He would like to stay with [facility] and use either [facility] or [facility] instead.   Pended referral to [facility] to see if this is acceptable replacement with Dr. Madilyn Fireman.   Patient instructed that once referral was sent he would be contacted by the facility to schedule appointment.   Patient gave return contact number same as listed in chart. [phone].

## 2022-08-07 NOTE — Progress Notes (Signed)
Orders Placed This Encounter  Procedures  . Ambulatory referral to Dermatology    Referral Priority:   Routine    Referral Type:   Consultation    Referral Reason:   Specialty Services Required    Requested Specialty:   Dermatology    Number of Visits Requested:   1   

## 2022-08-12 NOTE — Progress Notes (Signed)
Forwarding to [place] as an [place]. Please follow up on this request. Thanks in advance.

## 2022-08-14 ENCOUNTER — Ambulatory Visit: Payer: Medicare Other | Admitting: Family Medicine

## 2022-08-18 ENCOUNTER — Telehealth: Payer: Self-pay | Admitting: Family Medicine

## 2022-08-18 ENCOUNTER — Encounter: Payer: Self-pay | Admitting: Podiatry

## 2022-08-18 ENCOUNTER — Ambulatory Visit: Payer: Medicare Other | Admitting: Podiatry

## 2022-08-18 VITALS — BP 122/76 | HR 63

## 2022-08-18 DIAGNOSIS — S90212A Contusion of left great toe with damage to nail, initial encounter: Secondary | ICD-10-CM

## 2022-08-18 DIAGNOSIS — M545 Low back pain, unspecified: Secondary | ICD-10-CM

## 2022-08-18 DIAGNOSIS — E1142 Type 2 diabetes mellitus with diabetic polyneuropathy: Secondary | ICD-10-CM

## 2022-08-18 NOTE — Progress Notes (Signed)
  Subjective:  Patient ID: Jon [name], male    DOB: 1945-06-18,  MRN: [id number]  Chief Complaint  Patient presents with   Nail Problem    "I had this place come up on my nail." N - place on my nail L - hallux left D - 3 weeks O - suddenly C - throb once, discolored A - none T - called my primary care doctor    77 y.o. male presents with the above complaint. History confirmed with patient.  He is not sure how it happened he does not recall a particular traumatic event  Objective:  Physical Exam: warm, good capillary refill, no trophic changes or ulcerative lesions, normal DP and PT pulses, abnormal sensory exam, and left hallux subungual hematoma 50% of the lateral portion of the left hallux nail.  Assessment:   1. Subungual hematoma of great toe of left foot, initial encounter   2. Diabetic peripheral neuropathy      Plan:  Patient was evaluated and treated and all questions answered.  We discussed the presence of the subacute hematoma and how this can relate to his peripheral neuropathy with occult trauma.  Discussed future treatment including possibilities of need to remove the toenail if becomes loose or signs of infection develop which currently do not exist.  He shows no onycholysis.  The hematoma appears to be remote and so I do not think that trephination will be beneficial.  Advised this may take several months to fully resolve.  Return to see me as needed if it worsens.  His neuropathy is stable and he has had it for a number of years and is used to it.  We discussed diabetic footcare and importance of daily inspection of the feet.  He will notify me if he has any issues.  Return if symptoms worsen or fail to improve.

## 2022-08-18 NOTE — Telephone Encounter (Signed)
Patient is requesting orders for physical therapy. Patient is requesting orders be faxed to [facility] in [place], [place]. Please advise.

## 2022-08-19 NOTE — Telephone Encounter (Signed)
Referral, clinical notes and copies of insurance cards have been faxed to [facility] at [phone]. Office will contact patient to schedule referral appointment.

## 2022-08-19 NOTE — Telephone Encounter (Signed)
I do not mind placing referral I just need to know why he would like to go his it for his back?  What part of his back?

## 2022-08-19 NOTE — Telephone Encounter (Signed)
Orders Placed This Encounter  Procedures  . Ambulatory referral to Physical Therapy    Referral Priority:   Routine    Referral Type:   Physical Medicine    Referral Reason:   Specialty Services Required    Requested Specialty:   Physical Therapy    Number of Visits Requested:   1    

## 2022-08-19 NOTE — Telephone Encounter (Signed)
Spoke with patient and he states his lower back is hurting. He states his back hurts more when he stands up.  Patient states that [facility] was high recommended and he would like to try them to see if his lower back pain can be resolved or improved. Please advise.

## 2022-08-26 ENCOUNTER — Other Ambulatory Visit: Payer: Self-pay | Admitting: *Deleted

## 2022-08-26 MED ORDER — LISINOPRIL 2.5 MG PO TABS: 2.5000 mg | ORAL_TABLET | Freq: Every day | ORAL | 3 refills | Status: DC

## 2022-09-02 ENCOUNTER — Ambulatory Visit: Payer: Medicare Other | Admitting: Family Medicine

## 2022-09-03 ENCOUNTER — Ambulatory Visit: Payer: Medicare Other | Admitting: Family Medicine

## 2022-09-04 ENCOUNTER — Other Ambulatory Visit: Payer: Self-pay | Admitting: *Deleted

## 2022-09-04 MED ORDER — LISINOPRIL 2.5 MG PO TABS: 2.5000 mg | ORAL_TABLET | Freq: Every day | ORAL | 3 refills | Status: DC

## 2022-09-09 ENCOUNTER — Telehealth: Payer: Self-pay | Admitting: Pharmacist

## 2022-09-09 DIAGNOSIS — E118 Type 2 diabetes mellitus with unspecified complications: Secondary | ICD-10-CM

## 2022-09-09 NOTE — Addendum Note (Signed)
Addended by: Nani Gasser D on: 09/09/2022 05:39 PM   Modules accepted: Orders

## 2022-09-09 NOTE — Telephone Encounter (Signed)
   Agree, I think that he would be a extra cyst since.  Referral placed.

## 2022-09-09 NOTE — Progress Notes (Signed)
Patient appearing on report for quality metrics - A1c review.  Outreached patient to discuss medication management. Left voicemail for patient to return my call at their convenience.   Makeyla Govan, PharmD, BCPS Clinical Pharmacist [facility]  

## 2022-09-10 ENCOUNTER — Telehealth: Payer: Self-pay | Admitting: Pharmacist

## 2022-09-10 NOTE — Progress Notes (Signed)
Attempted to contact patient in response to referral placement by PCP for medication management. Patient answered and states he was unavailable and to try tomorrow morning. Will re-attempt.  Lynnda Shields, PharmD, BCPS Clinical Pharmacist [facility]

## 2022-09-11 ENCOUNTER — Telehealth: Payer: Self-pay | Admitting: Pharmacist

## 2022-09-11 NOTE — Progress Notes (Signed)
Attempted to contact patient to schedule appointment for medication management. Left HIPAA compliant message for patient to return my call at their convenience.   Lynnda Shields, PharmD, BCPS Clinical Pharmacist [facility]

## 2022-09-15 ENCOUNTER — Other Ambulatory Visit: Payer: Medicare Other | Admitting: Pharmacist

## 2022-09-15 NOTE — Patient Instructions (Signed)
Jon [name],  Thank you for speaking with me today. As discussed, Dr. Linford Arnold and I will work together to get you on an alternative insulin from levemir, since it will be discontinued from the manufacturer. That medicine is called tresiba. You take the same units, 50-52 units daily, when we make the switch.  In addition, I will send for ozempic, once weekly injection, to be covered by the company. We should be able to get both of these at no cost.  I will be in touch in 1-2 weeks when we have a status update of that application.  Take care, Jon [name], PharmD, BCPS Clinical Pharmacist [facility]

## 2022-09-15 NOTE — Progress Notes (Signed)
09/15/2022 Name: Jon [name] MRN: [id number] DOB: [birth year]  Chief Complaint  Patient presents with   Diabetes   Medication Assistance    Jon [name] is a 77 y.o. year old male who presented for a telephone visit.   They were referred to the pharmacist by their PCP for assistance in managing diabetes and medication access.    Subjective:  Care Team: Primary Care Provider: Agapito Games, MD    Medication Access/Adherence  Current Pharmacy:  [facility] - [place], [place] - [address] [address] [address] [place] [address] [place] [address] Phone: [phone] Fax: [phone]  [facility] [address]), [place] - [address] [place] [address] Phone: [phone] Fax: [phone]  [facility] - [place], [place] - [address] [place]  [address] Phone: [phone] Fax: [phone]   Patient reports affordability concerns with their medications: No  Patient reports access/transportation concerns to their pharmacy: No  Patient reports adherence concerns with their medications:  No     Diabetes:  Current medications: levemir 50-52 units daily, synjardy 12.25m-500mg  twice daily - patient reports no cost concerns with these medications  Current glucose readings: 120-140 Using traditional meter; testing 1 times daily   Patient denies hypoglycemic s/sx including dizziness, shakiness, sweating.  Patient denies hyperglycemic symptoms including polyuria, polydipsia, polyphagia, nocturia, neuropathy, blurred vision.  Current medication access support: none at present   Objective:  Lab Results  Component Value Date   HGBA1C 8.7 (A) 05/15/2022    Lab Results  Component Value Date   CREATININE 1.61 (H) 07/31/2022   BUN 23 07/31/2022   NA 133 (L) 07/31/2022   K 5.1 07/31/2022   CL 104  07/31/2022   CO2 22 07/31/2022    Lab Results  Component Value Date   CHOL 105 02/12/2022   HDL 36 (L) 02/12/2022   LDLCALC 46 02/12/2022   TRIG 126 02/12/2022   CHOLHDL 2.9 02/12/2022    Medications Reviewed Today     Reviewed by Gabriel Carina, RPH (Pharmacist) on 09/15/22 at 1732  Med List Status: <None>   Medication Order Taking? Sig Documenting Provider Last Dose Status Informant  Accu-Chek Softclix Lancets lancets [id number]  Use to check blood sugars twice daily Agapito Games, MD  Active   aspirin EC 81 MG tablet [id number]  Take 1 tablet (81 mg total) by mouth daily. Swallow whole. Schnier, Latina Craver, MD  Active   atorvastatin (LIPITOR) 80 MG tablet [id number]  Take 1 tablet (80 mg total) by mouth at bedtime. Agapito Games, MD  Active   bisoprolol (ZEBETA) 5 MG tablet [id number]  Take 0.5 tablets (2.5 mg total) by mouth daily. Marykay Lex, MD  Active   Blood Glucose Monitoring Suppl (ACCU-CHEK GUIDE ME) w/Device KIT [id number]  Check glucose three times daily. Dx. Code V03.5 Agapito Games, MD  Active   clopidogrel (PLAVIX) 75 MG tablet [id number]  TAKE 1 TABLET BY MOUTH DAILY Agapito Games, MD  Active   glucose blood (ACCU-CHEK GUIDE) test strip [id number]  Dx DM E11.9. Check fasting blood sugar every morning. Agapito Games, MD  Active   Insulin Pen Needle (EASY TOUCH PEN NEEDLES) 31G X 8 MM MISC [id number]  FOR USE WITH INJECTING  INSULIN DAILY Agapito Games, MD  Active   LEVEMIR FLEXPEN 100 UNIT/ML FlexPen [id number] Yes Inject 50  Units into the skin daily. [provider] Taking Active            Med Note Otelia Limes Aug 06, 2022 10:57 AM) 08/06/22: reports taking 55 U post- recent PCP appointment 08/04/22  lisinopril (ZESTRIL) 2.5 MG tablet [id number]  Take 1 tablet (2.5 mg total) by mouth daily. Agapito Games, MD  Active   Omega-3 Fatty Acids (FISH OIL) 1000 MG CAPS [id number]  Take 2 capsules by mouth  daily. [provider]  Active   SYNJARDY 12.5-500 MG TABS [id number]  TAKE 1 TABLET BY MOUTH  TWICE DAILY Agapito Games, MD  Active   traZODone (DESYREL) 50 MG tablet [id number]  Take 1.5 tablets (75 mg total) by mouth at bedtime. for sleep Agapito Games, MD  Active               Assessment/Plan:   Diabetes: - Currently uncontrolled - Recommend to continue current medications for now, though levemir insulin will be discontinued by the manufacturer & alternative insulin is needed. Provided patient education to switch from levemir to tresiba, same units (50-52 units daily), and will initiate ozempic 0.25mg  weekly with the goal to decrease/discontinue insulin if tolerates increasing dosages of ozempic  - Recommend to check glucose daily as is current routine - Meets financial criteria for tresiba and ozempic patient assistance program through [employer]. Will collaborate with provider, CPhT, and patient to pursue assistance.   Follow Up Plan: 2-4 weeks  Lynnda Shields, PharmD, BCPS Clinical Pharmacist [facility]

## 2022-09-17 ENCOUNTER — Telehealth: Payer: Self-pay

## 2022-09-17 NOTE — Telephone Encounter (Signed)
Submitted application for OZEMPIC & TRESIBA to [employer] for patient assistance via online portal.   Phone: [phone]

## 2022-09-17 NOTE — Telephone Encounter (Signed)
-----   Message from Gabriel Carina, [facility] sent at 09/15/2022  5:43 PM EDT ----- Hi, Can you submit online application for [employer] for: - tresiba 52 units daily - ozempic 0.5mg  weekly - pen needles?  Thank you! Thurston Hole

## 2022-09-19 ENCOUNTER — Other Ambulatory Visit: Payer: Self-pay | Admitting: Family Medicine

## 2022-09-25 NOTE — Telephone Encounter (Signed)
Received notification from [employer] regarding approval for [facility] & OZEMPIC. Patient assistance approved from 09/23/22 to 05/12/23.  MEDICATION WILL SHIP TO OFFICE IN 10-14 BUSINESS DAYS  Phone: [phone]

## 2022-10-14 ENCOUNTER — Telehealth: Payer: Self-pay

## 2022-10-14 NOTE — Telephone Encounter (Signed)
Forwarding to [place] as an FYI.  Archie Patten B)   [employer] PAP shipment for [employer] 100 u (5 boxes) & Novofine 32 g tip needles (2 boxes) received this morning. Please contact the patient to come and pick up their order today. Placed in the fridge with patient identifiers. Thanks in advance.   NDC: 9604-5409-81 LOT: [id number] EXP: 2024-10-09

## 2022-10-14 NOTE — Progress Notes (Signed)
   10/14/2022  Patient ID: Jon [name], male   DOB: 15-May-1945, 77 y.o.   MRN: [id number]  Kathalene Frames and pen needles rec'd from [employer] at PCP office to pick up Changing from Levemir to [place]- same dose Starting Ozempic 0.25mg  weekly with goal to titrate up and decrease insulin  Quit taking trazodone 37month ago d/t nightmares and waking but still having Bisoprolol for 137months approx When did trazodone get started-helped 4 months but has resumed

## 2022-10-14 NOTE — Telephone Encounter (Signed)
Yes we received this today.

## 2022-10-14 NOTE — Telephone Encounter (Signed)
LVM advising pt that his medication from the Patient Assistance is ready for pick up.

## 2022-10-14 NOTE — Telephone Encounter (Deleted)
Yes we received his shipment today.

## 2022-10-14 NOTE — Telephone Encounter (Signed)
Forwarding to [place] as an FYI.  Alinda Money B)  [employer] PAP shipment for Ozempic 2 mg dose (5 boxes) received this morning. Please contact the patient to come and pick up their order today. Placed in the fridge with patient identifier. Thanks in advance.   NDC: 4098-1191-47 LOT: [id number] EXP: 2024-03-11

## 2022-10-15 ENCOUNTER — Other Ambulatory Visit: Payer: Medicare Other

## 2022-10-15 NOTE — Progress Notes (Signed)
   10/15/2022  Patient ID: Jon [name], male   DOB: 1945-06-29, 77 y.o.   MRN: [id number]  Subjective/Objective:  Medication Problem -During DM medication discussion yesterday, patient inquired about any medications that could be contributing to insomnia, vivid nightmares, and activity during the night he does not recall -States he started taking trazodone for nightmares and trouble sleeping, which was beneficial for approximately 4 months  -Patient now reports waking during the night with trouble falling back asleep as well as nightmares returning.  He also endorses waking in the morning to realize he has moved furniture in his apartment in a "seemingly violent manner;" and he does not recall doing this. -Upon reviewing patient's medication list, bisoprolol and atorvastatin could be contributing factors.  Beta blockers act upon norephinephrine receptors of the brain, which could lead to nightmares and sleep disturbances.  Statins that cross the blood-brain barrier may also indirectly alter function of serotonin and neurotransmitters, which could also lead to nightmares and sleep disturbances. -Bisoprolol 2.5 mg daily started 01/16/22 -Atorvastatin increased to 80mg  from 40mg  01/20/22 -Trazodone increased to 75mg  March of 2024 in response to patient reporting 50mg  no longer as effective at controlling nightmares and insomnia -Patient stopped trazodone 1 month ago, as he thought this may be interacting with another medication and contributing to sleep disturbances.  This has not lead to any changes. -Took a trazodone last night and slept completely fine -Endorses some concern with dementia, stating has noticed decline in memory of late, too -Also of note, patient was treated with Chantix for smoking cessation; he has now completed, but this was also started around the time of bisoprolol initiation and and atorvastatin increase.  This medication has higher incidence of sleep disturbances very similar  to what patient is describing; but trials reflect these adverse events ceasing soon after discontinuation.  Assessment/Plan  Medication Problem -Based on timing of events and reported details of sleep disturbance, would blame Chantix IF patient was still taking or only recently stopped recently (has not taken in months) -Patient does not wish to follow-up with cardiology, but I recommended follow-up sooner than later with PCP -Could trial stopping bisoprolol for 2-4 weeks to evaluate for any benefit; if none, could reduce atorvastatin dose or even hold medication for 2-4 weeks. -If no benefit by process of elimination, I would recommend referral to neurology  Follow-up:  Will check in with patient in 4 weeks to follow-up on DM control   Lenna Gilford, PharmD, DPLA

## 2022-10-20 ENCOUNTER — Other Ambulatory Visit: Payer: Self-pay | Admitting: Family Medicine

## 2022-10-20 DIAGNOSIS — E118 Type 2 diabetes mellitus with unspecified complications: Secondary | ICD-10-CM

## 2022-10-21 ENCOUNTER — Other Ambulatory Visit: Payer: Self-pay

## 2022-10-21 ENCOUNTER — Ambulatory Visit: Payer: Medicare Other | Attending: Family Medicine | Admitting: Physical Therapy

## 2022-10-21 DIAGNOSIS — M545 Low back pain, unspecified: Secondary | ICD-10-CM | POA: Insufficient documentation

## 2022-10-21 DIAGNOSIS — M5459 Other low back pain: Secondary | ICD-10-CM | POA: Diagnosis not present

## 2022-10-21 NOTE — Therapy (Signed)
OUTPATIENT PHYSICAL THERAPY THORACOLUMBAR EVALUATION   Patient Name: Jon [name] MRN: [id number] DOB:Jan 02, 1946, 77 y.o., male Today's Date: 10/21/2022  END OF SESSION:  PT End of Session - 10/21/22 1343     Visit Number 1    Number of Visits 20    Date for PT Re-Evaluation 12/30/22    Authorization Type [employer] 2024    Authorization - Visit Number 1    Authorization - Number of Visits 20    Progress Note Due on Visit 10    PT Start Time 1345    PT Stop Time 1430    PT Time Calculation (min) 45 min    Activity Tolerance Patient tolerated treatment well    Behavior During Therapy [facility] for tasks assessed/performed             Past Medical History:  Diagnosis Date   BENIGN POSITIONAL VERTIGO 05/20/2010   Qualifier: Diagnosis of  By: Linford Arnold MD, Catherine     Centrilobular emphysema [facility])    Cerebrovascular disease 07/15/2018   Prior CVA & TIA. Carotid CTA ~50% Bilateral ICA,  Multifocal severe stenosis of the left V2 vertebral artery.  Right vertebral artery patent without significant stenosis.   Coronary artery disease involving native coronary artery with angina pectoris (HCC) 11/18/2017   Severe LM, LAD & RCA stenosis indicated on Coronary CTA => CaCardiac Cath 01/02/2022: dLM ~50%; mLAD 85%@D1  90% (1,1,1) - m-dLAD 90$ @ 65% D2 (0,1,1), RI 75%, pLCx 30%-OM1 60%, pRCA 25% - p-mRCA hazy 85%. Normal LVEF by Echo, EDP 16 mmHg. = Best option CABGrdiac Cath    Current every day smoker    No interest in quitting   Diabetes mellitus, type II, insulin dependent (HCC)    With peripheral neuropathy, peripheral vascular disease and coronary artery disease as complications.   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 => 12/17/2021: L Subclavia A  PTA=Stent with resolution of Sx.   TIA (transient ischemic attack) 11/11/2021   Brief episode of left sided arm pain and slurred speech   Past Surgical History:  Procedure Laterality Date   LEFT HEART CATH AND CORONARY ANGIOGRAPHY N/A 01/02/2022   Procedure: LEFT HEART CATH AND CORONARY ANGIOGRAPHY;  Surgeon: Marykay Lex, MD;  Location: [facility] INVASIVE CV LAB:: dLM ~50%; mLAD 85%@D1  90% (1,1,1) - m-dLAD 90$ @ 65% D2 (0,1,1), RI 75%, pLCx 30%-OM1 60%, pRCA 25% - p-mRCA hazy 85%. Normal LVEF by Echo, EDP 16 mmHg. = Best option CABG   PILONIDAL CYST EXCISION  1960, 61, 62, 63   TRANSTHORACIC ECHOCARDIOGRAM  11/28/2021   EF 55 to 60%.  No RWMA.  Moderate asymmetric LVH-basal septal.  GR 1 DD.  Normal RV.  Normal MV.  AOV sclerosis without stenosis.  Normal RAP.   UPPER EXTREMITY ANGIOGRAPHY Left 12/17/2021   Procedure: Upper Extremity Angiography;  Surgeon: Renford Dills, MD;  Location: [facility] INVASIVE CV LAB;  Service: Cardiovascular:: High-grade stenosis just distal to the origin of the L-Subclavian A, normal Innom A & L Carotid A => Ost-Prox L SubCl A 8x37x80 Stent - dilated with 9 mm balloon.   VASECTOMY     Zio Patch Monitor  11/2021   Predom Rhtyhm: SR - HR range 47-98 bpm, AVG 64 bpm.  3 runs of  NS VT  (> 4 PVC beats): Fastest: 5 beats - HR 125-162 bpm, ~ 145 bpm, 2.1 sec; Longest with Fastest Avg-12 beats, 150-158 bpm, ~ 155 bpm, 4.6 sec.  1  5 beat Atrial Run: HR 125, Avg 120 bpm,   No Sustained Arrhythmias,   Occasional isolated PACs (2.1%) & Rare isolated PVCs (<1%).  No patient triggered events.   Patient Active Problem List   Diagnosis Date Noted   Nightmares 08/04/2022   Senile purpura (HCC) 01/17/2022   Abnormal CT scan of heart    Carotid stenosis 12/01/2021   Precordial pain 11/16/2021   TIA (transient ischemic attack) 11/16/2021   Postural dizziness with near syncope 03/08/2020   Subclavian arterial stenosis (HCC) 12/07/2019   Abnormal TSH 12/07/2019   Insomnia  10/01/2018   Pulmonary nodule 07/15/2018   Cerebrovascular disease 07/15/2018   Continuous dependence on cigarette smoking 12/02/2017   History of stroke 12/02/2017   Shortness of breath on exertion 12/02/2017   Coronary artery disease involving native coronary artery with angina pectoris (HCC) 11/18/2017   Centrilobular emphysema (HCC) 11/03/2017   H/O parotidectomy 06/02/2017   Parotid mass 05/07/2017   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) 07/30/2016   CKD stage 3 due to type 2 diabetes mellitus (HCC) 03/22/2015   Screening for prostate cancer 07/22/2011   Diabetic peripheral neuropathy (HCC) 06/05/2011   ROTATOR CUFF SYNDROME, RIGHT 03/22/2010   History of cardiovascular disorder 02/28/2010   Controlled diabetes mellitus type 2 with complications (HCC) 01/31/2010   Hyperlipidemia due to type 2 diabetes mellitus (HCC) 01/31/2010   TOBACCO ABUSE 01/31/2010   Essential hypertension 01/31/2010    PCP: Dr. Nani Gasser   REFERRING PROVIDER: Dr. Nani Gasser   REFERRING DIAG: M54.50 (ICD-10-CM) - Acute bilateral low back pain, unspecified whether sciatica present  Rationale for Evaluation and Treatment: Rehabilitation  THERAPY DIAG:  Other low back pain  ONSET DATE: 05/12/2022  SUBJECTIVE:                                                                                                                                                                                           SUBJECTIVE STATEMENT: See pertinent history   PERTINENT HISTORY:  Pt reports playing golf all of his life and it was not until recently that he notices low back tightness accompanied by anterior thigh weakness. He also suffers from diabetes and a clogged arteries. He experiences most of his LE weakness when walking up hills when playing golf. He also feels pain when standing for prolonged periods of time. He is a part time Biomedical scientist and he  spends much of his day sitting. He had a prior history of smoking and he recently quit. Pt reports that he was also told recently that he has dementia which has decreased his short term memory.    PAIN:  Are you having pain? No  PRECAUTIONS: None  WEIGHT BEARING RESTRICTIONS: No  FALLS:  Has patient fallen in last 6 months? No near falls due to LE weakness   LIVING ENVIRONMENT: Lives with: lives alone Lives in: House/apartment Stairs: No Has following equipment at home: None  OCCUPATION: Retired   PLOF: Independent  PATIENT GOALS: Resolve soreness in low back and weakness of legs.  NEXT MD VISIT: Not sure   OBJECTIVE:   VITALS: BP 118/70  DIAGNOSTIC FINDINGS:  None listed in chart for lumbar region   PATIENT SURVEYS:  FOTO N/A  SCREENING FOR RED FLAGS: Bowel or bladder incontinence: No Spinal tumors: No Cauda equina syndrome: No Compression fracture: No Abdominal aneurysm: No  COGNITION: Overall cognitive status: Within functional limits for tasks assessed     SENSATION: WFL  MUSCLE LENGTH: Hamstrings: Right 70 deg; Left 90 deg Thomas test: NT   POSTURE: flexed trunk   PALPATION: Right greater trochanter   LUMBAR ROM:   AROM eval  Flexion 100%   Extension 100%  Right lateral flexion 50%  Left lateral flexion 50%  Right rotation 100%  Left rotation 100%   (Blank rows = not tested)  LOWER EXTREMITY ROM: WFL     LOWER EXTREMITY MMT:     MMT Right eval Left eval  Hip flexion 4+ 4+  Hip extension    Hip abduction    Hip adduction    Hip internal rotation    Hip external rotation    Knee flexion 4+ 4+  Knee extension 4+ 4+  Ankle dorsiflexion    Ankle plantarflexion    Ankle inversion    Ankle eversion     (Blank rows = not tested)   LUMBAR SPECIAL TESTS:  Straight leg raise test: Negative, FABER test: Negative, Thomas test: NT, and FADIR NT   FUNCTIONAL TESTS:  5 times sit to stand: 30 sec  30 seconds chair stand test 5 reps    GAIT: Distance walked: 50 ft  Assistive device utilized: None Level of assistance: Complete Independence Comments: Forward flexed posture   TODAY'S TREATMENT:                                                                                                                              DATE:   10/21/22  5 Times Sit to Stand: 30 sec  (Individuals with times that exceed the listed time have worse than average performance - predictive of recurrent falls in healthy community-living subjects)         - 70-79 y.o. 12.6 sec  30 sec chair stands: 5 reps  <11 reps pt is at an increased risk of falling    Clinical Test of Sensory Interaction  for Balance    (CTSIB):  CONDITION TIME STRATEGY SWAY  Eyes open, firm surface 30 seconds ankle   Eyes closed, firm surface 30 seconds ankle   Eyes open, foam surface 30 seconds ankle   Eyes closed, foam surface 30 seconds ankle      DGI: NT   PATIENT EDUCATION:  Education details: form and technique for correct form for exercise Person educated: Patient Education method: Programmer, multimedia, Demonstration, Verbal cues, and Handouts Education comprehension: verbalized understanding, returned demonstration, and verbal cues required  HOME EXERCISE PROGRAM: Access Code: [email or web address] URL: [email or web address] Date: 10/21/2022 Prepared by: Ellin Goodie  Exercises - Sit to Stand  - 3-4 x weekly - 3 sets - 10 reps - Seated Hip External Rotation Stretch  - 1 x daily - 3 reps - 30 sec  hold - Seated Hamstring Stretch  - 1 x daily - 3 reps - 30-60 sec  hold  ASSESSMENT:  CLINICAL IMPRESSION: Patient is a 77 y.o. white male who was seen today for physical therapy evaluation and treatment for low back pain.  He shows signs and symptoms of lumbar stenosis with pain that is exacerbated by lumbar extension and that results in neurogenic claudication. Due to dementia, pt requires frequent redirection to avoid being side tracked. Pt demonstrates low  pain and disability that make him most appropriate for the movement control treatment group. He shows decrease LE strength along with right hip inflexibility and pain with prolonged standing or lumbar extension that are making it difficult for him to stand to cook meals and to walk on golf course to play golf for physical activity. He will benefit from skilled PT to address these aforementioned deficits to return to his prior level of function and to decrease his risk of falling.   OBJECTIVE IMPAIRMENTS: decreased balance, decreased cognition, decreased strength, impaired flexibility, postural dysfunction, and pain.   ACTIVITY LIMITATIONS: lifting, bending, sitting, standing, stairs, transfers, bathing, and locomotion level  PARTICIPATION LIMITATIONS: cleaning, laundry, driving, shopping, community activity, occupation, and yard work  PERSONAL FACTORS: Age, Time since onset of injury/illness/exacerbation, and 3+ comorbidities: T2DM,   are also affecting patient's functional outcome.   REHAB POTENTIAL: Good  CLINICAL DECISION MAKING: Stable/uncomplicated  EVALUATION COMPLEXITY: Low   GOALS: Goals reviewed with patient? No  SHORT TERM GOALS: Target date: 11/04/2022  Pt will be independent with HEP in order to improve strength and balance in order to decrease fall risk and improve function at home and work. Baseline: NT  Goal status: INITIAL  2.  Pt will decrease 5TSTS by at least 3 seconds in order to demonstrate clinically significant improvement in LE strength Baseline: 30 sec  Goal status: INITIAL    LONG TERM GOALS: Target date: 12/30/2022  Patient will have improved function and activity level as evidenced by an increase in FOTO score by 10 points or more.  Baseline:  Goal status: INITIAL  2.  Patient will perform five sit to stand repetitions in <=15 secs as evidence of LE strength that shows that this community dwelling older adult that is older is at a decreased risk of  falling. (Buatois 2010)  ] Baseline: 30 sec  Goal status: INITIAL  3.  Patient will complete >=11 reps of sit to stand reps during 30 sec chair stands as evidence of LE endurance that places him at a decreased risk for falling.  Baseline: 5 reps  Goal status: INITIAL  4.  Patient will demonstrate reduced falls risk as evidenced by Dynamic Gait Index (  DGI) >19/24. Baseline: NT  Goal status: INITIAL  5.  Patient will be able to maintain 30 sec stance for all conditions within MCTSIB as evidence of improved static balance and sensory integration to maintain his balance to avoid falling.  Baseline: NT  Goal status: INITIAL  PLAN:  PT FREQUENCY: 1-2x/week  PT DURATION: 10 weeks  PLANNED INTERVENTIONS: Therapeutic exercises, Neuromuscular re-education, Balance training, Gait training, Patient/Family education, Self Care, Joint mobilization, Joint manipulation, Stair training, Aquatic Therapy, Dry Needling, Electrical stimulation, Spinal manipulation, Spinal mobilization, Cryotherapy, Moist heat, Manual therapy, and Re-evaluation.  PLAN FOR NEXT SESSION: DGI. TM test for lumbar stenosis. Education on lumbar stenosis. Progress hip and abdominal strength   Ellin Goodie PT, DPT  10/21/2022, 1:51 PM

## 2022-10-21 NOTE — Telephone Encounter (Signed)
Called patient, no answer, LVM to call office to schedule appointment, please advise, thanks.

## 2022-10-23 ENCOUNTER — Ambulatory Visit: Payer: Medicare Other | Admitting: Physical Therapy

## 2022-10-27 ENCOUNTER — Ambulatory Visit: Payer: Medicare Other | Admitting: Physical Therapy

## 2022-10-28 ENCOUNTER — Ambulatory Visit: Payer: Medicare Other | Admitting: Physical Therapy

## 2022-10-29 ENCOUNTER — Encounter: Payer: Medicare Other | Admitting: Physical Therapy

## 2022-10-30 ENCOUNTER — Encounter: Payer: Medicare Other | Admitting: Physical Therapy

## 2022-11-03 ENCOUNTER — Encounter: Payer: Medicare Other | Admitting: Physical Therapy

## 2022-11-04 ENCOUNTER — Encounter: Payer: Medicare Other | Admitting: Physical Therapy

## 2022-11-06 ENCOUNTER — Encounter: Payer: Medicare Other | Admitting: Physical Therapy

## 2022-11-10 ENCOUNTER — Encounter: Payer: Medicare Other | Admitting: Physical Therapy

## 2022-11-12 ENCOUNTER — Encounter: Payer: Medicare Other | Admitting: Physical Therapy

## 2022-11-17 ENCOUNTER — Encounter: Payer: Medicare Other | Admitting: Physical Therapy

## 2022-11-19 ENCOUNTER — Encounter: Payer: Medicare Other | Admitting: Physical Therapy

## 2022-11-21 ENCOUNTER — Ambulatory Visit: Payer: Medicare Other

## 2022-11-24 ENCOUNTER — Encounter: Payer: Medicare Other | Admitting: Physical Therapy

## 2022-11-27 ENCOUNTER — Encounter: Payer: Medicare Other | Admitting: Physical Therapy

## 2022-11-27 ENCOUNTER — Encounter: Payer: Self-pay | Admitting: Urology

## 2022-11-27 ENCOUNTER — Ambulatory Visit: Payer: Medicare Other | Admitting: Urology

## 2022-11-27 VITALS — BP 94/68 | HR 85 | Ht 74.0 in | Wt 210.0 lb

## 2022-11-27 DIAGNOSIS — N5082 Scrotal pain: Secondary | ICD-10-CM | POA: Diagnosis not present

## 2022-11-27 DIAGNOSIS — N451 Epididymitis: Secondary | ICD-10-CM | POA: Diagnosis not present

## 2022-11-27 LAB — URINALYSIS, COMPLETE
Bilirubin, UA: NEGATIVE
Ketones, UA: NEGATIVE
Leukocytes,UA: NEGATIVE
Nitrite, UA: NEGATIVE
Protein,UA: NEGATIVE
RBC, UA: NEGATIVE
Specific Gravity, UA: 1.01 (ref 1.005–1.030)
Urobilinogen, Ur: 0.2 mg/dL (ref 0.2–1.0)
pH, UA: 6 (ref 5.0–7.5)

## 2022-11-27 LAB — MICROSCOPIC EXAMINATION
Bacteria, UA: NONE SEEN
Epithelial Cells (non renal): NONE SEEN /hpf (ref 0–10)

## 2022-11-27 NOTE — Progress Notes (Signed)
I,Jeremy Z Plume,acting as a Neurosurgeon for Jon Altes, MD.,have documented all relevant documentation on the behalf of Jon Altes, MD,as directed by  Jon Altes, MD while in the presence of Jon Altes, MD.  11/27/2022 2:18 PM   Eulas Post 02/27/1946 [id number]  Referring provider: Agapito Games, MD [address],  [place] [address]  Chief Complaint  Patient presents with   Testicle Pain    HPI: Jon [name] is a 77 y.o. male who presents today for follow up.   Refer to my previous office note dated 07/30/2022 Relates to chronic left hemiscrotal pain, secondary to post vasectomy epididymitis from 36 years ago At his last office visit, he was given an anti-inflammatory trial, which did not significantly improve his symptoms. However, he describes his pain as a mild discomfort, which is worse when crossing his legs.  His pain does not interfere with his daily activities He can live with his pain, but wanted to make sure this would not develop into ay type of cancer At his last visit, his exam was benign His PCP has continued to check his PSA annually, which have been stable and are in the 0.2 range No bothersome LUTS Denies dysuria, gross hematuria Denies flank, abdominal or pelvic pain   PMH: Past Medical History:  Diagnosis Date   BENIGN POSITIONAL VERTIGO 05/20/2010   Qualifier: Diagnosis of  By: Linford Arnold MD, Catherine     Centrilobular emphysema [facility])    Cerebrovascular disease 07/15/2018   Prior CVA & TIA. Carotid CTA ~50% Bilateral ICA,  Multifocal severe stenosis of the left V2 vertebral artery.  Right vertebral artery patent without significant stenosis.   Coronary artery disease involving native coronary artery with angina pectoris (HCC) 11/18/2017   Severe LM, LAD & RCA stenosis indicated on Coronary CTA => CaCardiac Cath 01/02/2022: dLM ~50%; mLAD 85%@D1  90% (1,1,1) - m-dLAD 90$ @ 65% D2 (0,1,1), RI 75%,  pLCx 30%-OM1 60%, pRCA 25% - p-mRCA hazy 85%. Normal LVEF by Echo, EDP 16 mmHg. = Best option CABGrdiac Cath    Current every day smoker    No interest in quitting   Diabetes mellitus, type II, insulin dependent (HCC)    With peripheral neuropathy, peripheral vascular disease and coronary artery disease as complications.   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 => 12/17/2021: L Subclavia A PTA=Stent with resolution of Sx.   TIA (transient ischemic attack) 11/11/2021   Brief episode of left sided arm pain and slurred speech    Surgical History: Past Surgical History:  Procedure Laterality Date   LEFT HEART CATH AND CORONARY ANGIOGRAPHY N/A 01/02/2022   Procedure: LEFT HEART CATH AND CORONARY ANGIOGRAPHY;  Surgeon: Marykay Lex, MD;  Location: [facility] INVASIVE CV LAB:: dLM ~50%; mLAD 85%@D1  90% (1,1,1) - m-dLAD 90$ @ 65% D2 (0,1,1), RI 75%, pLCx 30%-OM1 60%, pRCA 25% - p-mRCA hazy 85%. Normal LVEF by Echo, EDP 16 mmHg. = Best option CABG   PILONIDAL CYST EXCISION  1960, 61, 62, 63   TRANSTHORACIC ECHOCARDIOGRAM  11/28/2021   EF 55 to 60%.  No RWMA.  Moderate asymmetric LVH-basal septal.  GR 1 DD.  Normal RV.  Normal MV.  AOV sclerosis without stenosis.  Normal RAP.   UPPER EXTREMITY ANGIOGRAPHY Left 12/17/2021   Procedure: Upper Extremity Angiography;  Surgeon: Renford Dills, MD;  Location: [facility] INVASIVE CV LAB;  Service: Cardiovascular:: High-grade stenosis just distal to the origin of the L-Subclavian A, normal Innom A & L Carotid A => Ost-Prox L SubCl A 8x37x80 Stent - dilated with 9 mm balloon.   VASECTOMY     Zio Patch Monitor  11/2021   Predom Rhtyhm: SR - HR range 47-98 bpm, AVG 64 bpm.  3 runs of NS VT  (> 4 PVC beats): Fastest: 5 beats - HR 125-162  bpm, ~ 145 bpm, 2.1 sec; Longest with Fastest Avg-12 beats, 150-158 bpm, ~ 155 bpm, 4.6 sec.  1  5 beat Atrial Run: HR 125, Avg 120 bpm,   No Sustained Arrhythmias,   Occasional isolated PACs (2.1%) & Rare isolated PVCs (<1%).  No patient triggered events.    Home Medications:  Allergies as of 11/27/2022       Reactions   Gabapentin Other (See Comments)   Nightmares.    Lisinopril Other (See Comments)   Hypotension   Metformin And Related Diarrhea        Medication List        Accurate as of November 27, 2022  2:18 PM. If you have any questions, ask your nurse or doctor.          Accu-Chek Guide Me w/Device Kit Check glucose three times daily. Dx. Code E11.8   Accu-Chek Guide test strip Generic drug: glucose blood Dx DM E11.9. Check fasting blood sugar every morning.   Accu-Chek Softclix Lancets lancets Use to check blood sugars twice daily   aspirin EC 81 MG tablet Take 1 tablet (81 mg total) by mouth daily. Swallow whole.   atorvastatin 80 MG tablet Commonly known as: LIPITOR TAKE 1 TABLET BY MOUTH AT  BEDTIME   bisoprolol 5 MG tablet Commonly known as: ZEBETA Take 0.5 tablets (2.5 mg total) by mouth daily.   clopidogrel 75 MG tablet Commonly known as: PLAVIX TAKE 1 TABLET BY MOUTH DAILY   Easy Touch Pen Needles 31G X 8 MM Misc Generic drug: Insulin Pen Needle FOR USE WITH INJECTING  INSULIN DAILY   Fish Oil 1000 MG Caps Take 2 capsules by mouth daily.   Levemir FlexPen 100 UNIT/ML FlexPen Generic drug: insulin detemir INJECT SUBCUTANEOUSLY 50 UNITS  DAILY   lisinopril 2.5 MG tablet Commonly known as: ZESTRIL Take 1 tablet (2.5 mg total) by mouth daily.   Synjardy 12.5-500 MG Tabs Generic drug: Empagliflozin-metFORMIN HCl TAKE 1 TABLET BY MOUTH  TWICE DAILY   traZODone 50 MG tablet Commonly known as: DESYREL Take 1.5 tablets (75 mg total) by mouth at bedtime. for sleep        Allergies:  Allergies  Allergen Reactions   Gabapentin Other  (See Comments)    Nightmares.    Lisinopril Other (See Comments)    Hypotension   Metformin And Related Diarrhea    Family History: Family History  Problem Relation Age of Onset   Diabetes Other        family hx of   Colon cancer Neg Hx     Social History:  reports that he quit smoking about 10 months ago. His smoking use included cigarettes. He started smoking about 62 years ago. He has a 62 pack-year smoking history. He has never used smokeless tobacco. He reports that he does not drink alcohol and does not use drugs.   Physical  Exam: BP 94/68   Pulse 85   Ht 6\' 2"  (1.88 m)   Wt 210 lb (95.3 kg)   BMI 26.96 kg/m   Constitutional:  Alert and oriented, No acute distress. HEENT: [place] AT, moist mucus membranes.  Trachea midline, no masses. Cardiovascular: No clubbing, cyanosis, or edema. Respiratory: Normal respiratory effort, no increased work of breathing. GI: Abdomen is soft, nontender, nondistended, no abdominal masses GU: Prostate 30 grams, smooth without nodules Skin: No rashes, bruises or suspicious lesions. Neurologic: Grossly intact, no focal deficits, moving all 4 extremities. Psychiatric: Normal mood and affect.   Assessment & Plan:    1. Chronic scrotal pain Likely secondary to post vasectomy epididymitis He has an atrophic left testes and he states his pain is not bothersome enough that he desires surgical management He was reassured there is no evidence of genitourinary cancer He would like to follow up as needed for any increasing pain  I have reviewed the above documentation for accuracy and completeness, and I agree with the above.   Jon Altes, MD  [facility] [address], [address] [place], [place] [address] [phone]

## 2022-12-01 ENCOUNTER — Encounter: Payer: Medicare Other | Admitting: Physical Therapy

## 2022-12-04 ENCOUNTER — Ambulatory Visit: Payer: Medicare Other | Admitting: Family Medicine

## 2022-12-04 ENCOUNTER — Encounter: Payer: Medicare Other | Admitting: Physical Therapy

## 2022-12-04 ENCOUNTER — Encounter: Payer: Self-pay | Admitting: Family Medicine

## 2022-12-04 VITALS — BP 117/69 | HR 65 | Resp 17 | Ht 74.0 in | Wt 220.4 lb

## 2022-12-04 DIAGNOSIS — E1169 Type 2 diabetes mellitus with other specified complication: Secondary | ICD-10-CM | POA: Diagnosis not present

## 2022-12-04 DIAGNOSIS — D692 Other nonthrombocytopenic purpura: Secondary | ICD-10-CM | POA: Diagnosis not present

## 2022-12-04 DIAGNOSIS — E785 Hyperlipidemia, unspecified: Secondary | ICD-10-CM

## 2022-12-04 DIAGNOSIS — I771 Stricture of artery: Secondary | ICD-10-CM

## 2022-12-04 DIAGNOSIS — N183 Chronic kidney disease, stage 3 unspecified: Secondary | ICD-10-CM

## 2022-12-04 DIAGNOSIS — E118 Type 2 diabetes mellitus with unspecified complications: Secondary | ICD-10-CM | POA: Diagnosis not present

## 2022-12-04 DIAGNOSIS — I10 Essential (primary) hypertension: Secondary | ICD-10-CM

## 2022-12-04 DIAGNOSIS — K5900 Constipation, unspecified: Secondary | ICD-10-CM

## 2022-12-04 DIAGNOSIS — J432 Centrilobular emphysema: Secondary | ICD-10-CM

## 2022-12-04 DIAGNOSIS — E1122 Type 2 diabetes mellitus with diabetic chronic kidney disease: Secondary | ICD-10-CM | POA: Diagnosis not present

## 2022-12-04 DIAGNOSIS — I25119 Atherosclerotic heart disease of native coronary artery with unspecified angina pectoris: Secondary | ICD-10-CM | POA: Diagnosis not present

## 2022-12-04 LAB — POCT GLYCOSYLATED HEMOGLOBIN (HGB A1C): Hemoglobin A1C: 7.8 % — AB (ref 4.0–5.6)

## 2022-12-04 MED ORDER — TRESIBA FLEXTOUCH 100 UNIT/ML ~~LOC~~ SOPN: 30.0000 [IU] | PEN_INJECTOR | Freq: Every day | SUBCUTANEOUS | Status: DC

## 2022-12-04 MED ORDER — SEMAGLUTIDE(0.25 OR 0.5MG/DOS) 2 MG/3ML ~~LOC~~ SOPN: 0.5000 mg | PEN_INJECTOR | SUBCUTANEOUS | Status: DC

## 2022-12-04 NOTE — Progress Notes (Signed)
Established Patient Office Visit  Subjective   Patient ID: Jon [name], male    DOB: [birth year]  Age: 77 y.o. MRN: [id number]  Chief Complaint  Patient presents with   Diabetes    A1C check    HPI   Diabetes - no hypoglycemic events. No wounds or sores that are not healing well. No increased thirst or urination. Checking glucose at home. Taking medications as prescribed without any side effects.  Has been on the 0.25 mg semaglutide for the last month and doing really well with it.  In fact he has noticed he has been able to decrease his insulin from 50 units down to 30 units.  Struggling with constipation lately. Stools have been too large to pass. No abd pain or nausea.  He has tried to use some suppositories over-the-counter.  He has tried to increase fiber.  Wants to know what else he could do that would be helpful.  Also wanted to give me an update.  He had a vasectomy right around the age of 3 and ever since then he has had a couple of episodes of right-sided epididymitis that is required treatment.  In fact it flared up recently and he did go see urology.  They did treat him and he is feeling a little better.  Though still little uncomfortable.  He also wanted to let me know for about the last month he is just noticed that his voice is a little more raspy and it will get a little bit more high-pitched in the afternoon.  He did call his ENT doctor to get in and has an appointment on August 18.  He is a former heavy smoker who quit earlier this year.  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     ROS    Objective:     BP 117/69 (BP Location: Left Arm, Patient Position: Sitting, Cuff Size: Large)   Pulse 65   Resp 17   Ht 6\' 2"  (1.88 m)   Wt 220 lb 6.4 oz (100 kg)   SpO2 100%   BMI 28.30 kg/m    Physical Exam Constitutional:      Appearance: He is well-developed.  HENT:     Head: Normocephalic and  atraumatic.  Cardiovascular:     Rate and Rhythm: Normal rate and regular rhythm.     Heart sounds: Normal heart sounds.  Pulmonary:     Effort: Pulmonary effort is normal.     Breath sounds: Normal breath sounds.  Skin:    General: Skin is warm and dry.  Neurological:     Mental Status: He is alert and oriented to person, place, and time.  Psychiatric:        Behavior: Behavior normal.      Results for orders placed or performed in visit on 12/04/22  POCT glycosylated hemoglobin (Hb A1C)  Result Value Ref Range   Hemoglobin A1C 7.8 (A) 4.0 - 5.6 %   HbA1c POC (<> result, manual entry)     HbA1c, POC (prediabetic range)     HbA1c, POC (controlled diabetic range)        The ASCVD Risk score (Arnett DK, et al., 2019) failed to calculate for the following reasons:   The patient has a prior MI or stroke diagnosis    Assessment & Plan:   Problem List Items Addressed This Visit       Cardiovascular  and Mediastinum   Subclavian arterial stenosis (HCC) (Chronic)    Continue to monitor blood pressure in both arms.  Continue daily statin and aspirin.      Senile purpura (HCC) (Chronic)    Able no recent changes he does take Plavix and aspirin which increases the bruising.      Essential hypertension (Chronic)    Well controlled. Continue current regimen. Follow up in  48mo       Coronary artery disease involving native coronary artery with angina pectoris (HCC) (Chronic)    Continue statin and aspirin and bisoprolol.        Respiratory   Centrilobular emphysema (HCC) (Chronic)    No recent flares or concerns.  Not currently on any inhaler treatment.        Endocrine   Hyperlipidemia due to type 2 diabetes mellitus (HCC) (Chronic)   Relevant Medications   insulin degludec (TRESIBA FLEXTOUCH) 100 UNIT/ML FlexTouch Pen   Semaglutide,0.25 or 0.5MG /DOS, 2 MG/3ML SOPN   Controlled diabetes mellitus type 2 with complications (HCC) - Primary (Chronic)    1C down to 7.8  today which is an improvement from 8.7.  He is actually tolerating the Ozempic really well except for some constipation.  We did discuss some strategies around that.  Also encouraged him to go ahead and move up to the 0.5 mg dose.  Plan to go up to 1 mg after 1 month with the goal of hopefully getting him off of insulin.  He is already decreased his [place] down to 30 units because he was getting blood sugars under 80 fasting.  Continue to work on [employer] and regular exercise.      Relevant Medications   insulin degludec (TRESIBA FLEXTOUCH) 100 UNIT/ML FlexTouch Pen   Semaglutide,0.25 or 0.5MG /DOS, 2 MG/3ML SOPN   Other Relevant Orders   POCT glycosylated hemoglobin (Hb A1C) (Completed)   CKD stage 3 due to type 2 diabetes mellitus (HCC) (Chronic)    Continue to follow renal function every 6 months serum creatinine is typically around 1.4.  Labs are up-to-date last creatinine was up just slightly at 1.6.  We did discuss the importance of making sure hydrating well.  This will help constipation as well.  Plan to recheck levels in about 2 months.      Relevant Medications   insulin degludec (TRESIBA FLEXTOUCH) 100 UNIT/ML FlexTouch Pen   Semaglutide,0.25 or 0.5MG /DOS, 2 MG/3ML SOPN   Other Visit Diagnoses     Constipation, unspecified constipation type           Constipation - MIralax, 1 capful mixed with 6 ounces of fluid nightly for the next 2 to 3 weeks.  If the bowels become more loose you can decrease to every other night. Also recommend pick up home enema kit to help loosen stool in the colon  If you get things well-regulated then you can start taking a stool softener daily as part of your normal routine to keep things moving.  Return in about 3 months (around 03/06/2023) for Diabetes follow-up.   I spent 42 minutes on the day of the encounter to include pre-visit record review, face-to-face time with the patient and post visit ordering of test.   Nani Gasser,  MD

## 2022-12-04 NOTE — Assessment & Plan Note (Signed)
Continue to monitor blood pressure in both arms.  Continue daily statin and aspirin.

## 2022-12-04 NOTE — Patient Instructions (Addendum)
MIralax, 1 capful mixed with 6 ounces of fluid nightly for the next 2 to 3 weeks.  If the bowels become more loose you can decrease to every other night. Also recommend pick up home enema kit to help loosen stool in the colon  If you get things well-regulated then you can start taking a stool softener daily as part of your normal routine to keep things moving. Did increase the Ozempic to 0.5 mg starting with your next injection on Wednesday.

## 2022-12-04 NOTE — Assessment & Plan Note (Signed)
1C down to 7.8 today which is an improvement from 8.7.  He is actually tolerating the Ozempic really well except for some constipation.  We did discuss some strategies around that.  Also encouraged him to go ahead and move up to the 0.5 mg dose.  Plan to go up to 1 mg after 1 month with the goal of hopefully getting him off of insulin.  He is already decreased his [place] down to 30 units because he was getting blood sugars under 80 fasting.  Continue to work on [employer] and regular exercise.

## 2022-12-04 NOTE — Assessment & Plan Note (Signed)
Continue to follow renal function every 6 months serum creatinine is typically around 1.4.  Labs are up-to-date last creatinine was up just slightly at 1.6.  We did discuss the importance of making sure hydrating well.  This will help constipation as well.  Plan to recheck levels in about 2 months.

## 2022-12-04 NOTE — Assessment & Plan Note (Signed)
Well controlled. Continue current regimen. Follow up in  6 mo  

## 2022-12-04 NOTE — Assessment & Plan Note (Signed)
Able no recent changes he does take Plavix and aspirin which increases the bruising.

## 2022-12-04 NOTE — Assessment & Plan Note (Signed)
Continue statin and aspirin and bisoprolol.

## 2022-12-04 NOTE — Assessment & Plan Note (Signed)
No recent flares or concerns.  Not currently on any inhaler treatment.

## 2022-12-08 ENCOUNTER — Encounter: Payer: Medicare Other | Admitting: Physical Therapy

## 2022-12-10 ENCOUNTER — Other Ambulatory Visit: Payer: Medicare Other

## 2022-12-10 NOTE — Progress Notes (Signed)
   12/10/2022  Patient ID: Jon [name], male   DOB: 07-21-1945, 77 y.o.   MRN: [id number]  Outreach for scheduled telephone follow-up visit unsuccessful, but I was able to leave HIPAA compliant voicemail with my direct phone number.  I will also send a MyChart message to attempt to reschedule visit.  Lenna Gilford, PharmD, DPLA

## 2022-12-11 ENCOUNTER — Encounter: Payer: Medicare Other | Admitting: Physical Therapy

## 2022-12-11 ENCOUNTER — Inpatient Hospital Stay
Admission: EM | Admit: 2022-12-11 | Discharge: 2022-12-14 | DRG: 322 | Disposition: A | Discharge status: Home / Self Care | Payer: Medicare Other | Attending: Internal Medicine | Admitting: Internal Medicine

## 2022-12-11 ENCOUNTER — Telehealth: Payer: Self-pay | Admitting: Family Medicine

## 2022-12-11 ENCOUNTER — Encounter: Payer: Self-pay | Admitting: Cardiology

## 2022-12-11 ENCOUNTER — Inpatient Hospital Stay
Admit: 2022-12-11 | Discharge: 2022-12-11 | Disposition: A | Discharge status: Home / Self Care | Payer: Medicare Other | Attending: Cardiology | Admitting: Cardiology

## 2022-12-11 ENCOUNTER — Emergency Department: Payer: Medicare Other

## 2022-12-11 ENCOUNTER — Other Ambulatory Visit: Payer: Self-pay

## 2022-12-11 ENCOUNTER — Encounter
Admission: EM | Disposition: A | Discharge status: Home / Self Care | Payer: Self-pay | Source: Home / Self Care | Attending: Internal Medicine

## 2022-12-11 DIAGNOSIS — F17200 Nicotine dependence, unspecified, uncomplicated: Secondary | ICD-10-CM | POA: Diagnosis not present

## 2022-12-11 DIAGNOSIS — I499 Cardiac arrhythmia, unspecified: Secondary | ICD-10-CM | POA: Diagnosis not present

## 2022-12-11 DIAGNOSIS — Z9852 Vasectomy status: Secondary | ICD-10-CM

## 2022-12-11 DIAGNOSIS — R531 Weakness: Secondary | ICD-10-CM

## 2022-12-11 DIAGNOSIS — I213 ST elevation (STEMI) myocardial infarction of unspecified site: Principal | ICD-10-CM

## 2022-12-11 DIAGNOSIS — I2510 Atherosclerotic heart disease of native coronary artery without angina pectoris: Secondary | ICD-10-CM | POA: Diagnosis present

## 2022-12-11 DIAGNOSIS — I2111 ST elevation (STEMI) myocardial infarction involving right coronary artery: Secondary | ICD-10-CM | POA: Diagnosis not present

## 2022-12-11 DIAGNOSIS — I252 Old myocardial infarction: Secondary | ICD-10-CM

## 2022-12-11 DIAGNOSIS — N183 Chronic kidney disease, stage 3 unspecified: Secondary | ICD-10-CM | POA: Diagnosis present

## 2022-12-11 DIAGNOSIS — I2119 ST elevation (STEMI) myocardial infarction involving other coronary artery of inferior wall: Secondary | ICD-10-CM | POA: Diagnosis present

## 2022-12-11 DIAGNOSIS — N1831 Chronic kidney disease, stage 3a: Secondary | ICD-10-CM | POA: Diagnosis not present

## 2022-12-11 DIAGNOSIS — Z7902 Long term (current) use of antithrombotics/antiplatelets: Secondary | ICD-10-CM | POA: Diagnosis not present

## 2022-12-11 DIAGNOSIS — Z87891 Personal history of nicotine dependence: Secondary | ICD-10-CM | POA: Diagnosis not present

## 2022-12-11 DIAGNOSIS — Z7982 Long term (current) use of aspirin: Secondary | ICD-10-CM | POA: Diagnosis not present

## 2022-12-11 DIAGNOSIS — Z794 Long term (current) use of insulin: Secondary | ICD-10-CM | POA: Diagnosis not present

## 2022-12-11 DIAGNOSIS — R110 Nausea: Secondary | ICD-10-CM | POA: Diagnosis not present

## 2022-12-11 DIAGNOSIS — I219 Acute myocardial infarction, unspecified: Secondary | ICD-10-CM | POA: Diagnosis not present

## 2022-12-11 DIAGNOSIS — Z8673 Personal history of transient ischemic attack (TIA), and cerebral infarction without residual deficits: Secondary | ICD-10-CM

## 2022-12-11 DIAGNOSIS — E1122 Type 2 diabetes mellitus with diabetic chronic kidney disease: Secondary | ICD-10-CM | POA: Diagnosis not present

## 2022-12-11 DIAGNOSIS — I129 Hypertensive chronic kidney disease with stage 1 through stage 4 chronic kidney disease, or unspecified chronic kidney disease: Secondary | ICD-10-CM | POA: Diagnosis not present

## 2022-12-11 DIAGNOSIS — Z951 Presence of aortocoronary bypass graft: Secondary | ICD-10-CM

## 2022-12-11 DIAGNOSIS — E119 Type 2 diabetes mellitus without complications: Secondary | ICD-10-CM

## 2022-12-11 DIAGNOSIS — Z743 Need for continuous supervision: Secondary | ICD-10-CM | POA: Diagnosis not present

## 2022-12-11 DIAGNOSIS — I959 Hypotension, unspecified: Secondary | ICD-10-CM | POA: Diagnosis not present

## 2022-12-11 DIAGNOSIS — J449 Chronic obstructive pulmonary disease, unspecified: Secondary | ICD-10-CM | POA: Diagnosis not present

## 2022-12-11 DIAGNOSIS — Z79899 Other long term (current) drug therapy: Secondary | ICD-10-CM | POA: Diagnosis not present

## 2022-12-11 DIAGNOSIS — J432 Centrilobular emphysema: Secondary | ICD-10-CM | POA: Diagnosis not present

## 2022-12-11 DIAGNOSIS — Z833 Family history of diabetes mellitus: Secondary | ICD-10-CM

## 2022-12-11 DIAGNOSIS — Z888 Allergy status to other drugs, medicaments and biological substances status: Secondary | ICD-10-CM

## 2022-12-11 DIAGNOSIS — E785 Hyperlipidemia, unspecified: Secondary | ICD-10-CM | POA: Diagnosis not present

## 2022-12-11 DIAGNOSIS — R079 Chest pain, unspecified: Secondary | ICD-10-CM | POA: Diagnosis not present

## 2022-12-11 HISTORY — PX: TRANSTHORACIC ECHOCARDIOGRAM: SHX275

## 2022-12-11 HISTORY — DX: Old myocardial infarction: I25.2

## 2022-12-11 HISTORY — PX: LEFT HEART CATH AND CORONARY ANGIOGRAPHY: CATH118249

## 2022-12-11 HISTORY — PX: CORONARY/GRAFT ACUTE MI REVASCULARIZATION: CATH118305

## 2022-12-11 LAB — COMPREHENSIVE METABOLIC PANEL
ALT: 19 U/L (ref 0–44)
AST: 18 U/L (ref 15–41)
Albumin: 3.7 g/dL (ref 3.5–5.0)
Alkaline Phosphatase: 57 U/L (ref 38–126)
Anion gap: 7 (ref 5–15)
BUN: 22 mg/dL (ref 8–23)
CO2: 24 mmol/L (ref 22–32)
Calcium: 8.6 mg/dL — ABNORMAL LOW (ref 8.9–10.3)
Chloride: 104 mmol/L (ref 98–111)
Creatinine, Ser: 1.73 mg/dL — ABNORMAL HIGH (ref 0.61–1.24)
GFR, Estimated: 40 mL/min — ABNORMAL LOW (ref >60)
Glucose, Bld: 181 mg/dL — ABNORMAL HIGH (ref 70–99)
Potassium: 3.8 mmol/L (ref 3.5–5.1)
Sodium: 135 mmol/L (ref 135–145)
Total Bilirubin: 0.5 mg/dL (ref 0.3–1.2)
Total Protein: 7 g/dL (ref 6.5–8.1)

## 2022-12-11 LAB — ECHOCARDIOGRAM COMPLETE
AR max vel: 3.03 cm2
AV Area VTI: 2.96 cm2
AV Area mean vel: 2.72 cm2
AV Mean grad: 2 mmHg
AV Peak grad: 4.1 mmHg
Ao pk vel: 1.01 m/s
Area-P 1/2: 3.17 cm2
Calc EF: 51.6 %
Height: 74 in
MV VTI: 1.97 cm2
S' Lateral: 3.4 cm
Single Plane A2C EF: 46.2 %
Single Plane A4C EF: 55.8 %
Weight: 3865.99 oz

## 2022-12-11 LAB — CBC WITH DIFFERENTIAL/PLATELET
Abs Immature Granulocytes: 0.04 10*3/uL (ref 0.00–0.07)
Basophils Absolute: 0 10*3/uL (ref 0.0–0.1)
Basophils Relative: 0 %
Eosinophils Absolute: 0.2 10*3/uL (ref 0.0–0.5)
Eosinophils Relative: 1 %
HCT: 41.6 % (ref 39.0–52.0)
Hemoglobin: 13.5 g/dL (ref 13.0–17.0)
Immature Granulocytes: 0 %
Lymphocytes Relative: 18 %
Lymphs Abs: 2 10*3/uL (ref 0.7–4.0)
MCH: 31.5 pg (ref 26.0–34.0)
MCHC: 32.5 g/dL (ref 30.0–36.0)
MCV: 97.2 fL (ref 80.0–100.0)
Monocytes Absolute: 0.6 10*3/uL (ref 0.1–1.0)
Monocytes Relative: 6 %
Neutro Abs: 8 10*3/uL — ABNORMAL HIGH (ref 1.7–7.7)
Neutrophils Relative %: 75 %
Platelets: 158 10*3/uL (ref 150–400)
RBC: 4.28 MIL/uL (ref 4.22–5.81)
RDW: 11.8 % (ref 11.5–15.5)
WBC: 10.9 10*3/uL — ABNORMAL HIGH (ref 4.0–10.5)
nRBC: 0 % (ref 0.0–0.2)

## 2022-12-11 LAB — CBC
HCT: 42 % (ref 39.0–52.0)
Hemoglobin: 13.7 g/dL (ref 13.0–17.0)
MCH: 31.6 pg (ref 26.0–34.0)
MCHC: 32.6 g/dL (ref 30.0–36.0)
MCV: 96.8 fL (ref 80.0–100.0)
Platelets: 158 10*3/uL (ref 150–400)
RBC: 4.34 MIL/uL (ref 4.22–5.81)
RDW: 11.8 % (ref 11.5–15.5)
WBC: 10.4 10*3/uL (ref 4.0–10.5)
nRBC: 0 % (ref 0.0–0.2)

## 2022-12-11 LAB — POCT I-STAT 7, (LYTES, BLD GAS, ICA,H+H)
Acid-base deficit: 3 mmol/L — ABNORMAL HIGH (ref 0.0–2.0)
Bicarbonate: 21.4 mmol/L (ref 20.0–28.0)
Calcium, Ion: 1.19 mmol/L (ref 1.15–1.40)
HCT: 36 % — ABNORMAL LOW (ref 39.0–52.0)
Hemoglobin: 12.2 g/dL — ABNORMAL LOW (ref 13.0–17.0)
O2 Saturation: 95 %
Potassium: 4.7 mmol/L (ref 3.5–5.1)
Sodium: 133 mmol/L — ABNORMAL LOW (ref 135–145)
TCO2: 22 mmol/L (ref 22–32)
pCO2 arterial: 35.4 mmHg (ref 32–48)
pH, Arterial: 7.39 (ref 7.35–7.45)
pO2, Arterial: 78 mmHg — ABNORMAL LOW (ref 83–108)

## 2022-12-11 LAB — GLUCOSE, CAPILLARY
Glucose-Capillary: 127 mg/dL — ABNORMAL HIGH (ref 70–99)
Glucose-Capillary: 107 mg/dL — ABNORMAL HIGH (ref 70–99)
Glucose-Capillary: 107 mg/dL — ABNORMAL HIGH (ref 70–99)
Glucose-Capillary: 93 mg/dL (ref 70–99)
Glucose-Capillary: 135 mg/dL — ABNORMAL HIGH (ref 70–99)

## 2022-12-11 LAB — LIPID PANEL
Cholesterol: 109 mg/dL (ref 0–200)
HDL: 37 mg/dL — ABNORMAL LOW (ref >40)
LDL Cholesterol: 51 mg/dL (ref 0–99)
Total CHOL/HDL Ratio: 2.9 RATIO
Triglycerides: 103 mg/dL (ref <150)
VLDL: 21 mg/dL (ref 0–40)

## 2022-12-11 LAB — BASIC METABOLIC PANEL
Anion gap: 10 (ref 5–15)
BUN: 20 mg/dL (ref 8–23)
CO2: 21 mmol/L — ABNORMAL LOW (ref 22–32)
Calcium: 8.8 mg/dL — ABNORMAL LOW (ref 8.9–10.3)
Chloride: 100 mmol/L (ref 98–111)
Creatinine, Ser: 1.45 mg/dL — ABNORMAL HIGH (ref 0.61–1.24)
GFR, Estimated: 50 mL/min — ABNORMAL LOW (ref >60)
Glucose, Bld: 127 mg/dL — ABNORMAL HIGH (ref 70–99)
Potassium: 4.4 mmol/L (ref 3.5–5.1)
Sodium: 131 mmol/L — ABNORMAL LOW (ref 135–145)

## 2022-12-11 LAB — APTT: aPTT: >200 seconds (ref 24–36)

## 2022-12-11 LAB — PROTIME-INR
INR: 1.4 — ABNORMAL HIGH (ref 0.8–1.2)
Prothrombin Time: 16.9 seconds — ABNORMAL HIGH (ref 11.4–15.2)

## 2022-12-11 LAB — MRSA NEXT GEN BY PCR, NASAL: MRSA by PCR Next Gen: NOT DETECTED

## 2022-12-11 LAB — POCT ACTIVATED CLOTTING TIME
Activated Clotting Time: 336 seconds
Activated Clotting Time: >1000 seconds

## 2022-12-11 LAB — TROPONIN I (HIGH SENSITIVITY)
Troponin I (High Sensitivity): 168 ng/L (ref <18)
Troponin I (High Sensitivity): >24000 ng/L (ref <18)

## 2022-12-11 LAB — CBG MONITORING, ED: Glucose-Capillary: 169 mg/dL — ABNORMAL HIGH (ref 70–99)

## 2022-12-11 SURGERY — CORONARY/GRAFT ACUTE MI REVASCULARIZATION
Anesthesia: Moderate Sedation

## 2022-12-11 MED ORDER — FENTANYL CITRATE (PF) 100 MCG/2ML IJ SOLN
INTRAMUSCULAR | Status: AC
  Filled 2022-12-11: qty 2

## 2022-12-11 MED ORDER — CHLORHEXIDINE GLUCONATE CLOTH 2 % EX PADS
6.0000 | MEDICATED_PAD | Freq: Every day | CUTANEOUS | Status: DC
  Administered 2022-12-11 – 2022-12-14 (×4): 6 via TOPICAL

## 2022-12-11 MED ORDER — TRAZODONE HCL 50 MG PO TABS
75.0000 mg | ORAL_TABLET | Freq: Every day | ORAL | Status: DC
  Administered 2022-12-11 – 2022-12-13 (×3): 75 mg via ORAL
  Filled 2022-12-11 (×3): qty 2

## 2022-12-11 MED ORDER — LABETALOL HCL 5 MG/ML IV SOLN: 10.0000 mg | INTRAVENOUS | Status: AC | PRN

## 2022-12-11 MED ORDER — SODIUM CHLORIDE 0.9 % IV SOLN: INTRAVENOUS | Status: DC

## 2022-12-11 MED ORDER — INSULIN ASPART 100 UNIT/ML IJ SOLN
0.0000 [IU] | Freq: Three times a day (TID) | INTRAMUSCULAR | Status: DC
  Filled 2022-12-11: qty 1

## 2022-12-11 MED ORDER — IOHEXOL 300 MG/ML  SOLN
INTRAMUSCULAR | Status: DC | PRN
  Administered 2022-12-11: 300 mL

## 2022-12-11 MED ORDER — ONDANSETRON HCL 4 MG/2ML IJ SOLN: 4.0000 mg | Freq: Four times a day (QID) | INTRAMUSCULAR | Status: DC | PRN

## 2022-12-11 MED ORDER — INSULIN DEGLUDEC 100 UNIT/ML ~~LOC~~ SOPN: 30.0000 [IU] | PEN_INJECTOR | Freq: Every day | SUBCUTANEOUS | Status: DC

## 2022-12-11 MED ORDER — NITROGLYCERIN 1 MG/10 ML FOR IR/CATH LAB
INTRA_ARTERIAL | Status: DC | PRN
  Administered 2022-12-11: 200 ug via INTRACORONARY

## 2022-12-11 MED ORDER — HEPARIN (PORCINE) IN NACL 1000-0.9 UT/500ML-% IV SOLN
INTRAVENOUS | Status: DC | PRN
  Administered 2022-12-11: 1000 mL

## 2022-12-11 MED ORDER — SODIUM CHLORIDE 0.9% FLUSH: 3.0000 mL | INTRAVENOUS | Status: DC | PRN

## 2022-12-11 MED ORDER — HYDRALAZINE HCL 20 MG/ML IJ SOLN: 10.0000 mg | INTRAMUSCULAR | Status: AC | PRN

## 2022-12-11 MED ORDER — ORAL CARE MOUTH RINSE: 15.0000 mL | OROMUCOSAL | Status: DC | PRN

## 2022-12-11 MED ORDER — HEPARIN (PORCINE) IN NACL 1000-0.9 UT/500ML-% IV SOLN
INTRAVENOUS | Status: AC
  Filled 2022-12-11: qty 1000

## 2022-12-11 MED ORDER — BISACODYL 10 MG RE SUPP
10.0000 mg | Freq: Every day | RECTAL | Status: DC | PRN
  Administered 2022-12-11: 10 mg via RECTAL
  Filled 2022-12-11: qty 1

## 2022-12-11 MED ORDER — INSULIN GLARGINE-YFGN 100 UNIT/ML ~~LOC~~ SOLN
20.0000 [IU] | Freq: Every day | SUBCUTANEOUS | Status: DC
  Administered 2022-12-11 – 2022-12-14 (×4): 20 [IU] via SUBCUTANEOUS
  Filled 2022-12-11 (×4): qty 0.2

## 2022-12-11 MED ORDER — ASPIRIN 81 MG PO CHEW: 81.0000 mg | CHEWABLE_TABLET | Freq: Every day | ORAL | Status: DC

## 2022-12-11 MED ORDER — ALUM & MAG HYDROXIDE-SIMETH 200-200-20 MG/5ML PO SUSP
30.0000 mL | Freq: Four times a day (QID) | ORAL | Status: DC | PRN
  Administered 2022-12-11: 30 mL via ORAL
  Filled 2022-12-11: qty 30

## 2022-12-11 MED ORDER — SODIUM CHLORIDE 0.9 % WEIGHT BASED INFUSION
1.0000 mL/kg/h | INTRAVENOUS | Status: AC
  Administered 2022-12-11: 1 mL/kg/h via INTRAVENOUS

## 2022-12-11 MED ORDER — ASPIRIN 81 MG PO CHEW: 324.0000 mg | CHEWABLE_TABLET | Freq: Once | ORAL | Status: DC

## 2022-12-11 MED ORDER — MIDAZOLAM HCL 2 MG/2ML IJ SOLN
INTRAMUSCULAR | Status: DC | PRN
  Administered 2022-12-11 (×4): .5 mg via INTRAVENOUS

## 2022-12-11 MED ORDER — LISINOPRIL 5 MG PO TABS
2.5000 mg | ORAL_TABLET | Freq: Every day | ORAL | Status: DC
  Administered 2022-12-11 – 2022-12-12 (×2): 2.5 mg via ORAL
  Filled 2022-12-11 (×2): qty 1

## 2022-12-11 MED ORDER — HEPARIN SODIUM (PORCINE) 1000 UNIT/ML IJ SOLN
INTRAMUSCULAR | Status: DC | PRN
  Administered 2022-12-11: 8000 [IU] via INTRAVENOUS
  Administered 2022-12-11: 4000 [IU] via INTRAVENOUS

## 2022-12-11 MED ORDER — TICAGRELOR 90 MG PO TABS
90.0000 mg | ORAL_TABLET | Freq: Two times a day (BID) | ORAL | Status: DC
  Administered 2022-12-11 – 2022-12-14 (×6): 90 mg via ORAL
  Filled 2022-12-11 (×6): qty 1

## 2022-12-11 MED ORDER — LACTULOSE 10 GM/15ML PO SOLN
10.0000 g | Freq: Two times a day (BID) | ORAL | Status: DC
  Administered 2022-12-11: 10 g via ORAL
  Filled 2022-12-11 (×5): qty 30

## 2022-12-11 MED ORDER — FENTANYL CITRATE (PF) 100 MCG/2ML IJ SOLN
INTRAMUSCULAR | Status: DC | PRN
  Administered 2022-12-11 (×4): 12.5 ug via INTRAVENOUS

## 2022-12-11 MED ORDER — INSULIN DEGLUDEC 100 UNIT/ML ~~LOC~~ SOPN
30.0000 [IU] | PEN_INJECTOR | Freq: Every day | SUBCUTANEOUS | Status: DC
  Filled 2022-12-11: qty 3

## 2022-12-11 MED ORDER — SODIUM CHLORIDE 0.9 % IV SOLN: 250.0000 mL | INTRAVENOUS | Status: DC | PRN

## 2022-12-11 MED ORDER — LIDOCAINE HCL (PF) 1 % IJ SOLN
INTRAMUSCULAR | Status: DC | PRN
  Administered 2022-12-11: 2 mL

## 2022-12-11 MED ORDER — ASPIRIN 81 MG PO TBEC: 81.0000 mg | DELAYED_RELEASE_TABLET | Freq: Every day | ORAL | Status: DC

## 2022-12-11 MED ORDER — NITROGLYCERIN 0.4 MG SL SUBL: 0.4000 mg | SUBLINGUAL_TABLET | SUBLINGUAL | Status: DC | PRN

## 2022-12-11 MED ORDER — TICAGRELOR 90 MG PO TABS
ORAL_TABLET | ORAL | Status: AC
  Filled 2022-12-11: qty 2

## 2022-12-11 MED ORDER — SODIUM CHLORIDE 0.9% FLUSH
3.0000 mL | Freq: Two times a day (BID) | INTRAVENOUS | Status: DC
  Administered 2022-12-11 – 2022-12-13 (×5): 3 mL via INTRAVENOUS

## 2022-12-11 MED ORDER — TICAGRELOR 90 MG PO TABS
ORAL_TABLET | ORAL | Status: DC | PRN
  Administered 2022-12-11: 180 mg via ORAL

## 2022-12-11 MED ORDER — VERAPAMIL HCL 2.5 MG/ML IV SOLN
INTRAVENOUS | Status: AC
  Filled 2022-12-11: qty 2

## 2022-12-11 MED ORDER — HEPARIN SODIUM (PORCINE) 5000 UNIT/ML IJ SOLN
4000.0000 [IU] | Freq: Once | INTRAMUSCULAR | Status: AC
  Administered 2022-12-11: 4000 [IU] via INTRAVENOUS
  Filled 2022-12-11: qty 1

## 2022-12-11 MED ORDER — ACETAMINOPHEN 325 MG PO TABS
650.0000 mg | ORAL_TABLET | ORAL | Status: DC | PRN
  Administered 2022-12-11: 650 mg via ORAL
  Filled 2022-12-11: qty 2

## 2022-12-11 MED ORDER — BISOPROLOL FUMARATE 5 MG PO TABS
2.5000 mg | ORAL_TABLET | Freq: Every day | ORAL | Status: DC
  Administered 2022-12-11: 2.5 mg via ORAL
  Filled 2022-12-11: qty 0.5

## 2022-12-11 MED ORDER — INSULIN ASPART 100 UNIT/ML IJ SOLN: 0.0000 [IU] | Freq: Every day | INTRAMUSCULAR | Status: DC

## 2022-12-11 MED ORDER — METOPROLOL SUCCINATE ER 25 MG PO TB24
25.0000 mg | ORAL_TABLET | Freq: Every day | ORAL | Status: DC
  Administered 2022-12-11 – 2022-12-12 (×2): 25 mg via ORAL
  Filled 2022-12-11 (×2): qty 1

## 2022-12-11 MED ORDER — ATORVASTATIN CALCIUM 80 MG PO TABS
80.0000 mg | ORAL_TABLET | Freq: Every day | ORAL | Status: DC
  Administered 2022-12-11 – 2022-12-13 (×3): 80 mg via ORAL
  Filled 2022-12-11 (×3): qty 1

## 2022-12-11 MED ORDER — SODIUM CHLORIDE 0.9 % IV BOLUS
INTRAVENOUS | Status: AC | PRN
  Administered 2022-12-11: 250 mL via INTRAVENOUS

## 2022-12-11 MED ORDER — VERAPAMIL HCL 2.5 MG/ML IV SOLN
INTRAVENOUS | Status: DC | PRN
  Administered 2022-12-11 (×2): 2.5 mg via INTRA_ARTERIAL

## 2022-12-11 MED ORDER — PANTOPRAZOLE SODIUM 40 MG PO TBEC
40.0000 mg | DELAYED_RELEASE_TABLET | Freq: Every day | ORAL | Status: DC
  Administered 2022-12-11 – 2022-12-14 (×4): 40 mg via ORAL
  Filled 2022-12-11 (×4): qty 1

## 2022-12-11 MED ORDER — ASPIRIN 81 MG PO TBEC
81.0000 mg | DELAYED_RELEASE_TABLET | Freq: Every day | ORAL | Status: DC
  Administered 2022-12-11 – 2022-12-14 (×4): 81 mg via ORAL
  Filled 2022-12-11 (×4): qty 1

## 2022-12-11 MED ORDER — HEPARIN SODIUM (PORCINE) 1000 UNIT/ML IJ SOLN
INTRAMUSCULAR | Status: AC
  Filled 2022-12-11: qty 10

## 2022-12-11 MED ORDER — MIDAZOLAM HCL 2 MG/2ML IJ SOLN
INTRAMUSCULAR | Status: AC
  Filled 2022-12-11: qty 2

## 2022-12-11 MED ORDER — NITROGLYCERIN 1 MG/10 ML FOR IR/CATH LAB
INTRA_ARTERIAL | Status: AC
  Filled 2022-12-11: qty 10

## 2022-12-11 MED ORDER — ACETAMINOPHEN 325 MG PO TABS: 650.0000 mg | ORAL_TABLET | ORAL | Status: DC | PRN

## 2022-12-11 MED ORDER — LIDOCAINE HCL 1 % IJ SOLN
INTRAMUSCULAR | Status: AC
  Filled 2022-12-11: qty 20

## 2022-12-11 SURGICAL SUPPLY — 26 items
BALLN TREK RX 2.25X15 (BALLOONS) ×1
BALLN ~~LOC~~ EUPHORA RX 2.75X20 (BALLOONS) ×1
BALLOON TREK RX 2.25X15 (BALLOONS) IMPLANT
BALLOON ~~LOC~~ EUPHORA RX 2.75X20 (BALLOONS) IMPLANT
CATH INFINITI 5 FR JL3.5 (CATHETERS) IMPLANT
CATH INFINITI 5FR ANG PIGTAIL (CATHETERS) IMPLANT
CATH INFINITI 5FR JL4 (CATHETERS) IMPLANT
CATH LAUNCHER 6FR 3DRC (CATHETERS) IMPLANT
CATH LAUNCHER 6FR JR4 (CATHETERS) IMPLANT
CATH VISTA GUIDE 6FR JR4 (CATHETERS) IMPLANT
CATHETER LAUNCHER 6FR 3DRC (CATHETERS) ×1
DEVICE RAD TR BAND REGULAR (VASCULAR PRODUCTS) IMPLANT
DRAPE BRACHIAL (DRAPES) IMPLANT
GLIDESHEATH SLEND SS 6F .021 (SHEATH) IMPLANT
GUIDEWIRE INQWIRE 1.5J.035X260 (WIRE) IMPLANT
INQWIRE 1.5J .035X260CM (WIRE) ×1
KIT ENCORE 26 ADVANTAGE (KITS) IMPLANT
KIT SYRINGE INJ CVI SPIKEX1 (MISCELLANEOUS) IMPLANT
PACK CARDIAC CATH (CUSTOM PROCEDURE TRAY) ×1 IMPLANT
PROTECTION STATION PRESSURIZED (MISCELLANEOUS) ×1
SET ATX-X65L (MISCELLANEOUS) IMPLANT
STATION PROTECTION PRESSURIZED (MISCELLANEOUS) IMPLANT
STENT ONYX FRONTIER 2.5X22 (Permanent Stent) IMPLANT
TUBING CIL FLEX 10 FLL-RA (TUBING) IMPLANT
WIRE ASAHI PROWATER 180CM (WIRE) IMPLANT
WIRE G HI TQ BMW 190 (WIRE) IMPLANT

## 2022-12-11 NOTE — ED Provider Notes (Signed)
[facility] Provider Note    Event Date/Time   First MD Initiated Contact with Patient 12/11/22 0142     (approximate)   History   Code STEMI   HPI  Jon [name] is a 77 y.o. male to the ED via EMS from home with a chief complaint of acute STEMI.  Patient called out for generalized weakness.  While there, patient was diaphoretic, pale and vomited.  EKG transmitted by EMS demonstrated inferior ST EMI this code STEMI was called in the field.  Patient given aspirin, IV fluids and nitroglycerin by EMS.  Reports chest pain x 45 minutes, radiating around his side to his back.  History of CAD.  Patient states 10 months ago he was recommended by cardiology for CABG which she declined at the time.  Chest pain currently resolved.  Denies recent fever/chills, shortness of breath, abdominal pain, dizziness.     Past Medical History   Past Medical History:  Diagnosis Date   BENIGN POSITIONAL VERTIGO 05/20/2010   Qualifier: Diagnosis of  By: Linford Arnold MD, Ronnette Juniper emphysema [facility])    Cerebrovascular disease 07/15/2018   Prior CVA & TIA. Carotid CTA ~50% Bilateral ICA,  Multifocal severe stenosis of the left V2 vertebral artery.  Right vertebral artery patent without significant stenosis.   Coronary artery disease involving native coronary artery with angina pectoris (HCC) 11/18/2017   Severe LM, LAD & RCA stenosis indicated on Coronary CTA => CaCardiac Cath 01/02/2022: dLM ~50%; mLAD 85%@D1  90% (1,1,1) - m-dLAD 90$ @ 65% D2 (0,1,1), RI 75%, pLCx 30%-OM1 60%, pRCA 25% - p-mRCA hazy 85%. Normal LVEF by Echo, EDP 16 mmHg. = Best option CABGrdiac Cath    Current every day smoker    No interest in quitting   Diabetes mellitus, type II, insulin dependent (HCC)    With peripheral neuropathy, peripheral vascular disease and coronary artery disease as complications.   Epididymitis    Hyperlipemia    Hypertension    Stroke (HCC) 11/2009   Subclavian  steal syndrome of left subclavian artery 11/19/2021   Severe stenosis (already seen on CT angiogram (symptoms of left arm claudication, cold left arm, subclavian steal neurologic symptoms. => 12/17/2021: L Subclavia A PTA=Stent with resolution of Sx.   TIA (transient ischemic attack) 11/11/2021   Brief episode of left sided arm pain and slurred speech     Active Problem List   Patient Active Problem List   Diagnosis Date Noted   Nightmares 08/04/2022   Senile purpura (HCC) 01/17/2022   Abnormal CT scan of heart    Carotid stenosis 12/01/2021   Precordial pain 11/16/2021   TIA (transient ischemic attack) 11/16/2021   Postural dizziness with near syncope 03/08/2020   Subclavian arterial stenosis (HCC) 12/07/2019   Abnormal TSH 12/07/2019   Insomnia 10/01/2018   Pulmonary nodule 07/15/2018   Cerebrovascular disease 07/15/2018   Continuous dependence on cigarette smoking 12/02/2017   History of stroke 12/02/2017   Shortness of breath on exertion 12/02/2017   Coronary artery disease involving native coronary artery with angina pectoris (HCC) 11/18/2017   Centrilobular emphysema (HCC) 11/03/2017   H/O parotidectomy 06/02/2017   Parotid mass 05/07/2017   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) 07/30/2016   CKD stage 3 due to type 2 diabetes mellitus (HCC) 03/22/2015   Screening for prostate cancer 07/22/2011   Diabetic peripheral neuropathy (HCC) 06/05/2011   ROTATOR CUFF SYNDROME, RIGHT 03/22/2010  History of cardiovascular disorder 02/28/2010   Controlled diabetes mellitus type 2 with complications (HCC) 01/31/2010   Hyperlipidemia due to type 2 diabetes mellitus (HCC) 01/31/2010   TOBACCO ABUSE 01/31/2010   Essential hypertension 01/31/2010     Past Surgical History   Past Surgical History:  Procedure Laterality Date   LEFT HEART CATH AND CORONARY ANGIOGRAPHY N/A 01/02/2022   Procedure: LEFT HEART CATH AND CORONARY  ANGIOGRAPHY;  Surgeon: Marykay Lex, MD;  Location: [facility] INVASIVE CV LAB:: dLM ~50%; mLAD 85%@D1  90% (1,1,1) - m-dLAD 90$ @ 65% D2 (0,1,1), RI 75%, pLCx 30%-OM1 60%, pRCA 25% - p-mRCA hazy 85%. Normal LVEF by Echo, EDP 16 mmHg. = Best option CABG   PILONIDAL CYST EXCISION  1960, 61, 62, 63   TRANSTHORACIC ECHOCARDIOGRAM  11/28/2021   EF 55 to 60%.  No RWMA.  Moderate asymmetric LVH-basal septal.  GR 1 DD.  Normal RV.  Normal MV.  AOV sclerosis without stenosis.  Normal RAP.   UPPER EXTREMITY ANGIOGRAPHY Left 12/17/2021   Procedure: Upper Extremity Angiography;  Surgeon: Renford Dills, MD;  Location: [facility] INVASIVE CV LAB;  Service: Cardiovascular:: High-grade stenosis just distal to the origin of the L-Subclavian A, normal Innom A & L Carotid A => Ost-Prox L SubCl A [id number] Stent - dilated with 9 mm balloon.   VASECTOMY     Zio Patch Monitor  11/2021   Predom Rhtyhm: SR - HR range 47-98 bpm, AVG 64 bpm.  3 runs of NS VT  (> 4 PVC beats): Fastest: 5 beats - HR 125-162 bpm, ~ 145 bpm, 2.1 sec; Longest with Fastest Avg-12 beats, 150-158 bpm, ~ 155 bpm, 4.6 sec.  1  5 beat Atrial Run: HR 125, Avg 120 bpm,   No Sustained Arrhythmias,   Occasional isolated PACs (2.1%) & Rare isolated PVCs (<1%).  No patient triggered events.     Home Medications   Prior to Admission medications   Medication Sig Start Date End Date Taking? Authorizing Provider  Accu-Chek Softclix Lancets lancets Use to check blood sugars twice daily 01/01/22   Agapito Games, MD  aspirin EC 81 MG tablet Take 1 tablet (81 mg total) by mouth daily. Swallow whole. 12/17/21 12/17/22  Schnier, Latina Craver, MD  atorvastatin (LIPITOR) 80 MG tablet TAKE 1 TABLET BY MOUTH AT  BEDTIME 09/23/22   Agapito Games, MD  bisoprolol (ZEBETA) 5 MG tablet Take 0.5 tablets (2.5 mg total) by mouth daily. 01/16/22   Marykay Lex, MD  Blood Glucose Monitoring Suppl (ACCU-CHEK GUIDE ME) w/Device KIT Check glucose three times daily. Dx.  Code E11.8 01/01/22   Agapito Games, MD  clopidogrel (PLAVIX) 75 MG tablet TAKE 1 TABLET BY MOUTH DAILY 06/09/22   Agapito Games, MD  glucose blood (ACCU-CHEK GUIDE) test strip Dx DM E11.9. Check fasting blood sugar every morning. 01/01/22   Agapito Games, MD  insulin degludec (TRESIBA FLEXTOUCH) 100 UNIT/ML FlexTouch Pen Inject 30-35 Units into the skin daily. 12/04/22   Agapito Games, MD  Insulin Pen Needle (EASY TOUCH PEN NEEDLES) 31G X 8 MM MISC FOR USE WITH INJECTING  INSULIN DAILY 05/01/22   Agapito Games, MD  lisinopril (ZESTRIL) 2.5 MG tablet Take 1 tablet (2.5 mg total) by mouth daily. 09/04/22   Agapito Games, MD  Omega-3 Fatty Acids (FISH OIL) 1000 MG CAPS Take 2 capsules by mouth daily.    [provider]  Semaglutide,0.25 or 0.5MG /DOS, 2 MG/3ML SOPN Inject 0.5 mg  into the skin every 7 (seven) days. 12/04/22   Agapito Games, MD  SYNJARDY 12.5-500 MG TABS TAKE 1 TABLET BY MOUTH  TWICE DAILY 09/03/21   Agapito Games, MD  traZODone (DESYREL) 50 MG tablet Take 1.5 tablets (75 mg total) by mouth at bedtime. for sleep 08/04/22   Agapito Games, MD     Allergies  Gabapentin, Lisinopril, and Metformin and related   Family History   Family History  Problem Relation Age of Onset   Diabetes Other        family hx of   Colon cancer Neg Hx      Physical Exam  Triage Vital Signs: ED Triage Vitals  Encounter Vitals Group     BP --      Systolic BP Percentile --      Diastolic BP Percentile --      Pulse Rate 12/11/22 0138 72     Resp --      Temp 12/11/22 0138 98.2 F (36.8 C)     Temp Source 12/11/22 0138 Oral     SpO2 12/11/22 0138 100 %     Weight 12/11/22 0142 241 lb 10 oz (109.6 kg)     Height 12/11/22 0142 6\' 2"  (1.88 m)     Head Circumference --      Peak Flow --      Pain Score --      Pain Loc --      Pain Education --      Exclude from Growth Chart --     Updated Vital Signs: BP (!)  151/77   Pulse 66   Temp 98.2 F (36.8 C) (Oral)   Resp (!) 25   Ht 6\' 2"  (1.88 m)   Wt 109.6 kg   SpO2 100%   BMI 31.02 kg/m    General: Awake, mild to moderate distress.  CV:  RRR. Good peripheral perfusion.  Resp:  Normal effort. CTAB. Abd:  Nontender.  No distention.  Other:  No pedal edema.   ED Results / Procedures / Treatments  Labs (all labs ordered are listed, but only abnormal results are displayed) Labs Reviewed  CBC WITH DIFFERENTIAL/PLATELET - Abnormal; Notable for the following components:      Result Value   WBC 10.9 (*)    Neutro Abs 8.0 (*)    All other components within normal limits  CBG MONITORING, ED - Abnormal; Notable for the following components:   Glucose-Capillary 169 (*)    All other components within normal limits  PROTIME-INR  APTT  COMPREHENSIVE METABOLIC PANEL  LIPID PANEL  TROPONIN I (HIGH SENSITIVITY)     EKG  ED ECG REPORT I, Judee Hennick J, the attending physician, personally viewed and interpreted this ECG.   Date: 12/11/2022  EKG Time: 0140  Rate: 67  Rhythm: normal sinus rhythm  Axis: Normal  Intervals:none  ST&T Change: Inferior STEMI    RADIOLOGY I have independently visualized and interpreted patient's x-ray as well as noted the radiology interpretation:  X-ray: No acute cardiopulmonary process  Official radiology report(s): DG Chest Port 1 View  Result Date: 12/11/2022 CLINICAL DATA:  Code STEMI. EXAM: PORTABLE CHEST 1 VIEW COMPARISON:  October 15, 2021 FINDINGS: The heart size and mediastinal contours are within normal limits. A radiopaque stent is seen overlying the superior mediastinum on the left. Both lungs are clear. The visualized skeletal structures are unremarkable. IMPRESSION: No active disease. Electronically Signed   By: Demetrius Revel.D.  On: 12/11/2022 01:58     PROCEDURES:  Critical Care performed: Yes, see critical care procedure note(s)  CRITICAL CARE Performed by: Irean Hong   Total  critical care time: 30 minutes  Critical care time was exclusive of separately billable procedures and treating other patients.  Critical care was necessary to treat or prevent imminent or life-threatening deterioration.  Critical care was time spent personally by me on the following activities: development of treatment plan with patient and/or surrogate as well as nursing, discussions with consultants, evaluation of patient's response to treatment, examination of patient, obtaining history from patient or surrogate, ordering and performing treatments and interventions, ordering and review of laboratory studies, ordering and review of radiographic studies, pulse oximetry and re-evaluation of patient's condition.   [name]1-3 Lead EKG Interpretation  Performed by: Irean Hong, MD Authorized by: Irean Hong, MD     Interpretation: normal     ECG rate:  70   ECG rate assessment: normal     Rhythm: sinus rhythm     Ectopy: none     Conduction: normal   Comments:     Patient placed on cardiac monitor to evaluate for arrhythmias    MEDICATIONS ORDERED IN ED: Medications  0.9 %  sodium chloride infusion ( Intravenous New Bag/Given 12/11/22 0147)  heparin injection 4,000 Units (4,000 Units Intravenous Given 12/11/22 0144)     IMPRESSION / MDM / ASSESSMENT AND PLAN / ED COURSE  I reviewed the triage vital signs and the nursing notes.                             77 year old male brought to the ED via EMS for acute inferior STEMI.  Code STEMI initiated in the field. Differential diagnosis includes, but is not limited to, ACS, aortic dissection, pulmonary embolism, cardiac tamponade, pneumothorax, pneumonia, pericarditis, myocarditis, GI-related causes including esophagitis/gastritis, and musculoskeletal chest wall pain.   I personally reviewed patient's records and note a PCP office visit from 12/04/2022 for follow-up chronic medical issues.  Patient's presentation is most consistent with acute  presentation with potential threat to life or bodily function.  The patient is on the cardiac monitor to evaluate for evidence of arrhythmia and/or significant heart rate changes.  Code STEMI activated in the field.  Patient is currently chest pain-free after nitroglycerin and aspirin given in the field.  Will administer IV heparin bolus.  Awaiting cardiology.  Clinical Course as of 12/11/22 0210  Thu Dec 11, 2022  0210 Dr. Cassie Freer at bedside taking patient to Cath Lab now [JS]    Clinical Course User Index [JS] Irean Hong, MD     FINAL CLINICAL IMPRESSION(S) / ED DIAGNOSES   Final diagnoses:  ST elevation myocardial infarction (STEMI), unspecified artery (HCC)  Weakness generalized     Rx / DC Orders   ED Discharge Orders     None        Note:  This document was prepared using Dragon voice recognition software and may include unintentional dictation errors.   Irean Hong, MD 12/11/22 [phone]

## 2022-12-11 NOTE — Plan of Care (Signed)
  Problem: Cardiovascular: Goal: Ability to achieve and maintain adequate cardiovascular perfusion will improve Outcome: Progressing   Problem: Activity: Goal: Ability to return to baseline activity level will improve Outcome: Progressing   Problem: Cardiovascular: Goal: Ability to achieve and maintain adequate cardiovascular perfusion will improve Outcome: Progressing Goal: Vascular access site(s) Level 0-1 will be maintained Outcome: Progressing   Problem: Education: Goal: Understanding of CV disease, CV risk reduction, and recovery process will improve Outcome: Not Progressing

## 2022-12-11 NOTE — ED Notes (Addendum)
**  PARASCHOS, MD/CARDIOLOGIST IN ROOM AT THIS TIME

## 2022-12-11 NOTE — Assessment & Plan Note (Signed)
Renal function at baseline CKD 3a-b

## 2022-12-11 NOTE — Progress Notes (Signed)
*  PRELIMINARY RESULTS* Echocardiogram 2D Echocardiogram has been performed.  Carolyne Fiscal 12/11/2022, 3:55 PM

## 2022-12-11 NOTE — ED Notes (Signed)
PT placed on 2LPM O2 via NC

## 2022-12-11 NOTE — ED Triage Notes (Signed)
Pt BIB EMS on a code STEMI. Per pt had sx's of restlessness, SHOB, diaphoresis., nausea and emesis. Pt states sx's began around midnight.   EMS admin Pt 1 nitroglycerin spray and 4 baby ASA, IV EST [id number]

## 2022-12-11 NOTE — ED Notes (Signed)
EMS STEMI EKG time 0120

## 2022-12-11 NOTE — Telephone Encounter (Signed)
Patient is in hospital in [place] he had a heart attack and still trying to find a cure for constipation He wanted to let you know

## 2022-12-11 NOTE — ED Notes (Signed)
Pt denies CP on arrival.

## 2022-12-11 NOTE — Assessment & Plan Note (Signed)
Basal insulin with sliding scale coverage

## 2022-12-11 NOTE — ED Notes (Signed)
CBG 169. 

## 2022-12-11 NOTE — Progress Notes (Signed)
[facility] Cardiology  CARDIOLOGY CONSULT NOTE  Patient ID: Jon [name] MRN: [id number] DOB/AGE: [birth year] 76 y.o.  Admit date: 12/11/2022 Referring Physician Dolores Frame Primary Physician  Primary Cardiologist Herbie Baltimore Reason for Consultation inferior STEMI  HPI: 77 year old gentleman referred for inferior STEMI.  The patient underwent cardiac catheterization 01/02/2022 which revealed 2% stenosis distal left main, 85% stenosis mid LAD, 60% stenosis OM1, and 80% stenosis proximal RCA.  CABG was recommended, however patient was hesitant.  He was in his usual state of health till midnight tonight when he experienced shortness of breath with left-sided chest discomfort.  He presented to [facility] ED where ECG revealed ST elevations in leads II, III and aVF V5 and V6 consistent with inferior ST elevation myocardial infarction.  The patient underwent urgent cardiac catheterization which revealed occluded proximal RCA, 75% stenosis distal left main, 70% stenosis proximal LAD, 75% stenosis mid LAD, 75% stenosis OM1.  Left ventriculography revealed mildly reduced left ventricular function with LVEF 45 to 50%.  The patient underwent primary PCI receiving 2.5 x 22 mm Onyx frontier drug-eluting stent proximal RCA with excellent angiographic result and resolution of chest pain.  Interval History:  -Seen and examined this morning in stepdown his the son-in-law at bedside -Angina free since LHC -Inferolateral STE improving on repeat EKG -Denies shortness of breath, heart racing or palpitations, orthopnea, or PND.  Primarily concerned about constipation (for the past several weeks)   Review of systems complete and found to be negative unless listed above     Past Medical History:  Diagnosis Date   BENIGN POSITIONAL VERTIGO 05/20/2010   Qualifier: Diagnosis of  By: Linford Arnold MD, Catherine     Centrilobular emphysema [facility])    Cerebrovascular disease 07/15/2018   Prior CVA & TIA. Carotid CTA ~50% Bilateral ICA,   Multifocal severe stenosis of the left V2 vertebral artery.  Right vertebral artery patent without significant stenosis.   Coronary artery disease involving native coronary artery with angina pectoris (HCC) 11/18/2017   Severe LM, LAD & RCA stenosis indicated on Coronary CTA => CaCardiac Cath 01/02/2022: dLM ~50%; mLAD 85%@D1  90% (1,1,1) - m-dLAD 90$ @ 65% D2 (0,1,1), RI 75%, pLCx 30%-OM1 60%, pRCA 25% - p-mRCA hazy 85%. Normal LVEF by Echo, EDP 16 mmHg. = Best option CABGrdiac Cath    Current every day smoker    No interest in quitting   Diabetes mellitus, type II, insulin dependent (HCC)    With peripheral neuropathy, peripheral vascular disease and coronary artery disease as complications.   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 => 12/17/2021: L Subclavia A PTA=Stent with resolution of Sx.   TIA (transient ischemic attack) 11/11/2021   Brief episode of left sided arm pain and slurred speech    Past Surgical History:  Procedure Laterality Date   CORONARY/GRAFT ACUTE MI REVASCULARIZATION N/A 12/11/2022   Procedure: Coronary/Graft Acute MI Revascularization;  Surgeon: Marcina Millard, MD;  Location: [facility] INVASIVE CV LAB;  Service: Cardiovascular;  Laterality: N/A;   LEFT HEART CATH AND CORONARY ANGIOGRAPHY N/A 01/02/2022   Procedure: LEFT HEART CATH AND CORONARY ANGIOGRAPHY;  Surgeon: Marykay Lex, MD;  Location: [facility] INVASIVE CV LAB:: dLM ~50%; mLAD 85%@D1  90% (1,1,1) - m-dLAD 90$ @ 65% D2 (0,1,1), RI 75%, pLCx 30%-OM1 60%, pRCA 25% -  p-mRCA hazy 85%. Normal LVEF by Echo, EDP 16 mmHg. = Best option CABG   LEFT HEART CATH AND CORONARY ANGIOGRAPHY N/A 12/11/2022   Procedure: LEFT HEART CATH AND CORONARY ANGIOGRAPHY;  Surgeon: Marcina Millard, MD;  Location:  [facility] INVASIVE CV LAB;  Service: Cardiovascular;  Laterality: N/A;   PILONIDAL CYST EXCISION  1960, 61, 62, 63   TRANSTHORACIC ECHOCARDIOGRAM  11/28/2021   EF 55 to 60%.  No RWMA.  Moderate asymmetric LVH-basal septal.  GR 1 DD.  Normal RV.  Normal MV.  AOV sclerosis without stenosis.  Normal RAP.   UPPER EXTREMITY ANGIOGRAPHY Left 12/17/2021   Procedure: Upper Extremity Angiography;  Surgeon: Renford Dills, MD;  Location: [facility] INVASIVE CV LAB;  Service: Cardiovascular:: High-grade stenosis just distal to the origin of the L-Subclavian A, normal Innom A & L Carotid A => Ost-Prox L SubCl A 8x37x80 Stent - dilated with 9 mm balloon.   VASECTOMY     Zio Patch Monitor  11/2021   Predom Rhtyhm: SR - HR range 47-98 bpm, AVG 64 bpm.  3 runs of NS VT  (> 4 PVC beats): Fastest: 5 beats - HR 125-162 bpm, ~ 145 bpm, 2.1 sec; Longest with Fastest Avg-12 beats, 150-158 bpm, ~ 155 bpm, 4.6 sec.  1  5 beat Atrial Run: HR 125, Avg 120 bpm,   No Sustained Arrhythmias,   Occasional isolated PACs (2.1%) & Rare isolated PVCs (<1%).  No patient triggered events.    Medications Prior to Admission  Medication Sig Dispense Refill Last Dose   Accu-Chek Softclix Lancets lancets Use to check blood sugars twice daily 300 each 3    aspirin EC 81 MG tablet Take 1 tablet (81 mg total) by mouth daily. Swallow whole. 150 tablet 1    atorvastatin (LIPITOR) 80 MG tablet TAKE 1 TABLET BY MOUTH AT  BEDTIME 100 tablet 1    bisoprolol (ZEBETA) 5 MG tablet Take 0.5 tablets (2.5 mg total) by mouth daily. 90 tablet 3    Blood Glucose Monitoring Suppl (ACCU-CHEK GUIDE ME) w/Device KIT Check glucose three times daily. Dx. Code E11.8 1 kit 3    clopidogrel (PLAVIX) 75 MG tablet TAKE 1 TABLET BY MOUTH DAILY 100 tablet 2    glucose blood (ACCU-CHEK GUIDE) test strip Dx DM E11.9. Check fasting blood sugar every morning. 300 each PRN    insulin degludec (TRESIBA FLEXTOUCH) 100 UNIT/ML FlexTouch Pen Inject 30-35 Units into the skin  daily.      Insulin Pen Needle (EASY TOUCH PEN NEEDLES) 31G X 8 MM MISC FOR USE WITH INJECTING  INSULIN DAILY 100 each 3    lisinopril (ZESTRIL) 2.5 MG tablet Take 1 tablet (2.5 mg total) by mouth daily. 90 tablet 3    Omega-3 Fatty Acids (FISH OIL) 1000 MG CAPS Take 2 capsules by mouth daily.      Semaglutide,0.25 or 0.5MG /DOS, 2 MG/3ML SOPN Inject 0.5 mg into the skin every 7 (seven) days.      SYNJARDY 12.5-500 MG TABS TAKE 1 TABLET BY MOUTH  TWICE DAILY 180 tablet 3    traZODone (DESYREL) 50 MG tablet Take 1.5 tablets (75 mg total) by mouth at bedtime. for sleep 135 tablet 1    Social History   Socioeconomic History   Marital status: Divorced    Spouse name: Not on file   Number of children: 2   Years of education: 12th grade   Highest education level: High school graduate  Occupational History   Occupation: Retired  Tobacco Use   Smoking status: Former    Current packs/day: 0.00    Average packs/day: 1 pack/day for 62.0 years (62.0 ttl pk-yrs)    Types: Cigarettes    Start date: 01/13/1960    Quit date: 01/12/2022    Years since quitting: 0.9   Smokeless tobacco: Never  Vaping Use   Vaping status: Never Used  Substance and Sexual Activity   Alcohol use: No   Drug use: No   Sexual activity: Not on file  Other Topics Concern   Not on file  Social History Narrative   Lives alone and Likes to play golf 2 x 3 times a week.   Social Determinants of Health   Financial Resource Strain: Low Risk  (08/01/2022)   Overall Financial Resource Strain (CARDIA)    Difficulty of Paying Living Expenses: Not hard at all  Food Insecurity: No Food Insecurity (08/01/2022)   Hunger Vital Sign    Worried About Running Out of Food in the Last Year: Never true    Ran Out of Food in the Last Year: Never true  Transportation Needs: No Transportation Needs (08/06/2022)   PRAPARE - Administrator, Civil Service (Medical): No    Lack of Transportation (Non-Medical): No  Physical Activity:  Inactive (08/01/2022)   Exercise Vital Sign    Days of Exercise per Week: 0 days    Minutes of Exercise per Session: 0 min  Stress: No Stress Concern Present (08/01/2022)   [employer] of Occupational Health - Occupational Stress Questionnaire    Feeling of Stress : Not at all  Social Connections: Socially Isolated (08/01/2022)   Social Connection and Isolation Panel [NHANES]    Frequency of Communication with Friends and Family: Three times a week    Frequency of Social Gatherings with Friends and Family: Once a week    Attends Religious Services: Never    Database administrator or Organizations: No    Attends Banker Meetings: Never    Marital Status: Divorced  Catering manager Violence: Not At Risk (08/01/2022)   Humiliation, Afraid, Rape, and Kick questionnaire    Fear of Current or Ex-Partner: No    Emotionally Abused: No    Physically Abused: No    Sexually Abused: No    Family History  Problem Relation Age of Onset   Diabetes Other        family hx of   Colon cancer Neg Hx      PHYSICAL EXAM  General: Conversational elderly Caucasian male, well nourished, in no acute distress.  Laying at low incline in stepdown bed with son-in-law present at bedside HEENT:  Normocephalic and atramatic Neck:  No JVD.  Lungs: Normal respiratory effort on room air Heart: HRRR . Normal S1 and S2 without gallops or murmurs.  Abdomen: Nondistended appearing with excess adiposity NWG:NFAOZH strength and tone for age. Extremities: No clubbing, cyanosis or edema.   Neuro: Alert and oriented X 3. Psych:  Good affect, responds appropriately  Labs:   Lab Results  Component Value Date   WBC 10.4 12/11/2022   HGB 13.7 12/11/2022   HCT 42.0 12/11/2022   MCV 96.8 12/11/2022   PLT 158 12/11/2022    Recent Labs  Lab 12/11/22 0144 12/11/22 0337 12/11/22 0440  NA 135   < > 131*  K 3.8   < > 4.4  CL 104  --  100  CO2 24  --  21*  BUN 22  --  20  CREATININE 1.73*  --   1.45*  CALCIUM 8.6*  --  8.8*  PROT 7.0  --   --   BILITOT 0.5  --   --   ALKPHOS 57  --   --   ALT 19  --   --   AST 18  --   --   GLUCOSE 181*  --  127*   < > = values in this interval not displayed.   No results found for: "CKTOTAL", "CKMB", "CKMBINDEX", "TROPONINI"  Lab Results  Component Value Date   CHOL 109 12/11/2022   CHOL 105 02/12/2022   CHOL 145 11/22/2020   Lab Results  Component Value Date   HDL 37 (L) 12/11/2022   HDL 36 (L) 02/12/2022   HDL 42 11/22/2020   Lab Results  Component Value Date   LDLCALC 51 12/11/2022   LDLCALC 46 02/12/2022   LDLCALC 78 11/22/2020   Lab Results  Component Value Date   TRIG 103 12/11/2022   TRIG 126 02/12/2022   TRIG 157 (H) 11/22/2020   Lab Results  Component Value Date   CHOLHDL 2.9 12/11/2022   CHOLHDL 2.9 02/12/2022   CHOLHDL 3.5 11/22/2020   No results found for: "LDLDIRECT"    Radiology: CARDIAC CATHETERIZATION  Result Date: 12/11/2022   1st Mrg lesion is 75% stenosed.   Prox LAD lesion is 70% stenosed.   Mid LAD lesion is 75% stenosed.   Mid LM to Dist LM lesion is 75% stenosed.   Prox RCA lesion is 100% stenosed.   A drug-eluting stent was successfully placed using a STENT ONYX FRONTIER 2.5X22.   Post intervention, there is a 0% residual stenosis.   There is mild left ventricular systolic dysfunction.   LV end diastolic pressure is mildly elevated.   The left ventricular ejection fraction is 45-50% by visual estimate. 1.  Inferior STEMI 2.  Three-vessel coronary artery disease with 75% stenosis distal left main, 70% stenosis proximal LAD, 75% stenosis mid LAD, 95% stenosis OM1, 100% stenosis proximal RCA (culprit vessel) 3.  Mildly reduced left ventricular function with estimated LV ejection fraction 45 to 50% 4.  Successful primary PCI with 2.5 x 22 mm Onyx frontier drug-eluting stent proximal RCA Recommendations 1.  Dual antiplatelet therapy uninterrupted x 1 year 2.  Start metoprolol succinate 25 mg daily 3.  2D  echocardiogram 4.  Start ACE/ARB prior to discharge 5.  Cardiac rehabilitation after discharge 6.  Close cardiology follow-up after discharge   DG Chest Port 1 View  Result Date: 12/11/2022 CLINICAL DATA:  Code STEMI. EXAM: PORTABLE CHEST 1 VIEW COMPARISON:  October 15, 2021 FINDINGS: The heart size and mediastinal contours are within normal limits. A radiopaque stent is seen overlying the superior mediastinum on the left. Both lungs are clear. The visualized skeletal structures are unremarkable. IMPRESSION: No active disease. Electronically Signed   By: Aram Candela M.D.   On: 12/11/2022 01:58    EKG: Sinus rhythm with ST elevation in leads II, III, aVF, V5 and V6  ASSESSMENT AND PLAN:   1.  Inferior STEMI, with occluded proximal RCA, and residual 75% stenosis distal left main, 70% stenosis proximal LAD, 75% stenosis mid LAD, 75% stenosis OM1, with preserved left ventricular function.  Patient underwent primary PCI with 2.5 x 22 mm Onyx frontier stent proximal/mid RCA, with excellent angiographic result and resolution of chest pain. 2.  Hyperlipidemia, on high intensity atorvastatin 3.  Type 2 diabetes, on insulin  Recommendations  1.  Dual antiplatelet therapy uninterrupted x 1 year with aspirin 81 mg daily, and Brilinta 90 mg twice daily 2.  continue high intensity atorvastatin 80mg  daily 3.  Start metoprolol succinate 25 mg daily, lisinopril 2.5 mg daily 4.  2D echocardiogram, performed this morning, pending read 5.  Readdress need for CABG in the future 6.  Okay for transfer to PCU today 7.  [facility] to follow tomorrow, 8/2   This patient's plan of care was discussed and created with Dr. Darrold Junker and he is in agreement.     Signed: Cheryln Manly  PA-C 12/11/2022, 10:06 AM

## 2022-12-11 NOTE — Assessment & Plan Note (Signed)
-  Nicotine patch 

## 2022-12-11 NOTE — Assessment & Plan Note (Signed)
Continue antiplatelet and statin

## 2022-12-11 NOTE — ED Notes (Signed)
**  PARASCHOS, MD/CARDIOLOGIST IN DEPARTMENT AT THIS TIME

## 2022-12-11 NOTE — ED Notes (Signed)
Family updated as to patient's status and procedure of cardiac catheterization. Family (Tiffany/daughter and Miles/son) taken to Cath waiting room and informed that Cardiologist would come out once procedure complete to provide update and plan of continued care/admission.

## 2022-12-11 NOTE — Assessment & Plan Note (Signed)
Not acutely exacerbated DuoNebs as needed

## 2022-12-11 NOTE — Progress Notes (Signed)
   12/11/22 0137  Spiritual Encounters  Type of Visit Initial  Care provided to: Pt and family  Referral source Code page  OnCall Visit Yes  Advance Directives (For Healthcare)  Does Patient Have a Medical Advance Directive? No  Would patient like information on creating a medical advance directive? No - Patient declined  Mental Health Advance Directives  Does Patient Have a Mental Health Advance Directive? No   Chaplain responds to code- stemi with compassionate care. Patient cared for by interdisciplinary team. Family taken to cath lab waiting area.  Patient transferred to ICU. Family notes will return later in the day. Chaplain spiritual support services are available as the need arises.

## 2022-12-11 NOTE — Consult Note (Signed)
[facility] Cardiology  CARDIOLOGY CONSULT NOTE  Patient ID: Jon [name] MRN: [id number] DOB/AGE: 1946/01/25 77 y.o.  Admit date: 12/11/2022 Referring Physician Dolores Frame Primary Physician  Primary Cardiologist Herbie Baltimore Reason for Consultation inferior STEMI  HPI: 77 year old gentleman referred for inferior STEMI.  The patient underwent cardiac catheterization 01/02/2022 which revealed 2% stenosis distal left main, 85% stenosis mid LAD, 60% stenosis OM1, and 80% stenosis proximal RCA.  CABG was recommended, however patient was hesitant.  He was in his usual state of health till midnight tonight when he experienced shortness of breath with left-sided chest discomfort.  He presented to [facility] ED where ECG revealed ST elevations in leads II, III and aVF V5 and V6 consistent with inferior ST elevation myocardial infarction.  The patient underwent urgent cardiac catheterization which revealed occluded proximal RCA, 75% stenosis distal left main, 70% stenosis proximal LAD, 75% stenosis mid LAD, 75% stenosis OM1.  Left ventriculography revealed mildly reduced left ventricular function with LVEF 45 to 50%.  The patient underwent primary PCI receiving 2.5 x 22 mm Onyx frontier drug-eluting stent proximal RCA with excellent angiographic result and resolution of chest pain.  Review of systems complete and found to be negative unless listed above     Past Medical History:  Diagnosis Date   BENIGN POSITIONAL VERTIGO 05/20/2010   Qualifier: Diagnosis of  By: Linford Arnold MD, Ronnette Juniper emphysema [facility])    Cerebrovascular disease 07/15/2018   Prior CVA & TIA. Carotid CTA ~50% Bilateral ICA,  Multifocal severe stenosis of the left V2 vertebral artery.  Right vertebral artery patent without significant stenosis.   Coronary artery disease involving native coronary artery with angina pectoris (HCC) 11/18/2017   Severe LM, LAD & RCA stenosis indicated on Coronary CTA => CaCardiac Cath 01/02/2022: dLM ~50%; mLAD  85%@D1  90% (1,1,1) - m-dLAD 90$ @ 65% D2 (0,1,1), RI 75%, pLCx 30%-OM1 60%, pRCA 25% - p-mRCA hazy 85%. Normal LVEF by Echo, EDP 16 mmHg. = Best option CABGrdiac Cath    Current every day smoker    No interest in quitting   Diabetes mellitus, type II, insulin dependent (HCC)    With peripheral neuropathy, peripheral vascular disease and coronary artery disease as complications.   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 => 12/17/2021: L Subclavia A PTA=Stent with resolution of Sx.   TIA (transient ischemic attack) 11/11/2021   Brief episode of left sided arm pain and slurred speech    Past Surgical History:  Procedure Laterality Date   LEFT HEART CATH AND CORONARY ANGIOGRAPHY N/A 01/02/2022   Procedure: LEFT HEART CATH AND CORONARY ANGIOGRAPHY;  Surgeon: Marykay Lex, MD;  Location: [facility] INVASIVE CV LAB:: dLM ~50%; mLAD 85%@D1  90% (1,1,1) - m-dLAD 90$ @ 65% D2 (0,1,1), RI 75%, pLCx 30%-OM1 60%, pRCA 25% - p-mRCA hazy 85%. Normal LVEF by Echo, EDP 16 mmHg. = Best option CABG   PILONIDAL CYST EXCISION  1960, 61, 62, 63   TRANSTHORACIC ECHOCARDIOGRAM  11/28/2021   EF 55 to 60%.  No RWMA.  Moderate asymmetric LVH-basal septal.  GR 1 DD.  Normal RV.  Normal MV.  AOV sclerosis without stenosis.  Normal RAP.   UPPER EXTREMITY ANGIOGRAPHY Left 12/17/2021   Procedure: Upper Extremity Angiography;  Surgeon: Renford Dills,  MD;  Location: [facility] INVASIVE CV LAB;  Service: Cardiovascular:: High-grade stenosis just distal to the origin of the L-Subclavian A, normal Innom A & L Carotid A => Ost-Prox L SubCl A 8x37x80 Stent - dilated with 9 mm balloon.   VASECTOMY     Zio Patch Monitor  11/2021   Predom Rhtyhm: SR - HR range 47-98 bpm, AVG 64 bpm.  3 runs of NS VT  (> 4 PVC  beats): Fastest: 5 beats - HR 125-162 bpm, ~ 145 bpm, 2.1 sec; Longest with Fastest Avg-12 beats, 150-158 bpm, ~ 155 bpm, 4.6 sec.  1  5 beat Atrial Run: HR 125, Avg 120 bpm,   No Sustained Arrhythmias,   Occasional isolated PACs (2.1%) & Rare isolated PVCs (<1%).  No patient triggered events.    Medications Prior to Admission  Medication Sig Dispense Refill Last Dose   Accu-Chek Softclix Lancets lancets Use to check blood sugars twice daily 300 each 3    aspirin EC 81 MG tablet Take 1 tablet (81 mg total) by mouth daily. Swallow whole. 150 tablet 1    atorvastatin (LIPITOR) 80 MG tablet TAKE 1 TABLET BY MOUTH AT  BEDTIME 100 tablet 1    bisoprolol (ZEBETA) 5 MG tablet Take 0.5 tablets (2.5 mg total) by mouth daily. 90 tablet 3    Blood Glucose Monitoring Suppl (ACCU-CHEK GUIDE ME) w/Device KIT Check glucose three times daily. Dx. Code E11.8 1 kit 3    clopidogrel (PLAVIX) 75 MG tablet TAKE 1 TABLET BY MOUTH DAILY 100 tablet 2    glucose blood (ACCU-CHEK GUIDE) test strip Dx DM E11.9. Check fasting blood sugar every morning. 300 each PRN    insulin degludec (TRESIBA FLEXTOUCH) 100 UNIT/ML FlexTouch Pen Inject 30-35 Units into the skin daily.      Insulin Pen Needle (EASY TOUCH PEN NEEDLES) 31G X 8 MM MISC FOR USE WITH INJECTING  INSULIN DAILY 100 each 3    lisinopril (ZESTRIL) 2.5 MG tablet Take 1 tablet (2.5 mg total) by mouth daily. 90 tablet 3    Omega-3 Fatty Acids (FISH OIL) 1000 MG CAPS Take 2 capsules by mouth daily.      Semaglutide,0.25 or 0.5MG /DOS, 2 MG/3ML SOPN Inject 0.5 mg into the skin every 7 (seven) days.      SYNJARDY 12.5-500 MG TABS TAKE 1 TABLET BY MOUTH  TWICE DAILY 180 tablet 3    traZODone (DESYREL) 50 MG tablet Take 1.5 tablets (75 mg total) by mouth at bedtime. for sleep 135 tablet 1    Social History   Socioeconomic History   Marital status: Divorced    Spouse name: Not on file   Number of children: 2   Years of education: 12th grade   Highest education level:  High school graduate  Occupational History   Occupation: Retired  Tobacco Use   Smoking status: Former    Current packs/day: 0.00    Average packs/day: 1 pack/day for 62.0 years (62.0 ttl pk-yrs)    Types: Cigarettes    Start date: 01/13/1960    Quit date: 01/12/2022    Years since quitting: 0.9   Smokeless tobacco: Never  Vaping Use   Vaping status: Never Used  Substance and Sexual Activity   Alcohol use: No   Drug use: No   Sexual activity: Not on file  Other Topics Concern   Not on file  Social History Narrative   Lives alone and Likes to play golf 2 x 3 times a week.  Social Determinants of Health   Financial Resource Strain: Low Risk  (08/01/2022)   Overall Financial Resource Strain (CARDIA)    Difficulty of Paying Living Expenses: Not hard at all  Food Insecurity: No Food Insecurity (08/01/2022)   Hunger Vital Sign    Worried About Running Out of Food in the Last Year: Never true    Ran Out of Food in the Last Year: Never true  Transportation Needs: No Transportation Needs (08/06/2022)   PRAPARE - Administrator, Civil Service (Medical): No    Lack of Transportation (Non-Medical): No  Physical Activity: Inactive (08/01/2022)   Exercise Vital Sign    Days of Exercise per Week: 0 days    Minutes of Exercise per Session: 0 min  Stress: No Stress Concern Present (08/01/2022)   [employer] of Occupational Health - Occupational Stress Questionnaire    Feeling of Stress : Not at all  Social Connections: Socially Isolated (08/01/2022)   Social Connection and Isolation Panel [NHANES]    Frequency of Communication with Friends and Family: Three times a week    Frequency of Social Gatherings with Friends and Family: Once a week    Attends Religious Services: Never    Database administrator or Organizations: No    Attends Banker Meetings: Never    Marital Status: Divorced  Catering manager Violence: Not At Risk (08/01/2022)   Humiliation, Afraid,  Rape, and Kick questionnaire    Fear of Current or Ex-Partner: No    Emotionally Abused: No    Physically Abused: No    Sexually Abused: No    Family History  Problem Relation Age of Onset   Diabetes Other        family hx of   Colon cancer Neg Hx       Review of systems complete and found to be negative unless listed above      PHYSICAL EXAM  General: Well developed, well nourished, in no acute distress HEENT:  Normocephalic and atramatic Neck:  No JVD.  Lungs: Clear bilaterally to auscultation and percussion. Heart: HRRR . Normal S1 and S2 without gallops or murmurs.  Abdomen: Bowel sounds are positive, abdomen soft and non-tender  Msk:  Back normal, normal gait. Normal strength and tone for age. Extremities: No clubbing, cyanosis or edema.   Neuro: Alert and oriented X 3. Psych:  Good affect, responds appropriately  Labs:   Lab Results  Component Value Date   WBC 10.9 (H) 12/11/2022   HGB 12.2 (L) 12/11/2022   HCT 36.0 (L) 12/11/2022   MCV 97.2 12/11/2022   PLT 158 12/11/2022    Recent Labs  Lab 12/11/22 0144 12/11/22 0337  NA 135 133*  K 3.8 4.7  CL 104  --   CO2 24  --   BUN 22  --   CREATININE 1.73*  --   CALCIUM 8.6*  --   PROT 7.0  --   BILITOT 0.5  --   ALKPHOS 57  --   ALT 19  --   AST 18  --   GLUCOSE 181*  --    No results found for: "CKTOTAL", "CKMB", "CKMBINDEX", "TROPONINI"  Lab Results  Component Value Date   CHOL 109 12/11/2022   CHOL 105 02/12/2022   CHOL 145 11/22/2020   Lab Results  Component Value Date   HDL 37 (L) 12/11/2022   HDL 36 (L) 02/12/2022   HDL 42 11/22/2020   Lab Results  Component Value  Date   LDLCALC 51 12/11/2022   LDLCALC 46 02/12/2022   LDLCALC 78 11/22/2020   Lab Results  Component Value Date   TRIG 103 12/11/2022   TRIG 126 02/12/2022   TRIG 157 (H) 11/22/2020   Lab Results  Component Value Date   CHOLHDL 2.9 12/11/2022   CHOLHDL 2.9 02/12/2022   CHOLHDL 3.5 11/22/2020   No results found  for: "LDLDIRECT"    Radiology: CARDIAC CATHETERIZATION  Result Date: 12/11/2022   1st Mrg lesion is 75% stenosed.   Prox LAD lesion is 70% stenosed.   Mid LAD lesion is 75% stenosed.   Mid LM to Dist LM lesion is 75% stenosed.   Prox RCA lesion is 100% stenosed.   A drug-eluting stent was successfully placed using a STENT ONYX FRONTIER 2.5X22.   Post intervention, there is a 0% residual stenosis.   There is mild left ventricular systolic dysfunction.   LV end diastolic pressure is mildly elevated.   The left ventricular ejection fraction is 45-50% by visual estimate. 1.  Inferior STEMI 2.  Three-vessel coronary artery disease with 75% stenosis distal left main, 70% stenosis proximal LAD, 75% stenosis mid LAD, 95% stenosis OM1, 100% stenosis proximal RCA (culprit vessel) 3.  Mildly reduced left ventricular function with estimated LV ejection fraction 45 to 50% 4.  Successful primary PCI with 2.5 x 22 mm Onyx frontier drug-eluting stent proximal RCA Recommendations 1.  Dual antiplatelet therapy uninterrupted x 1 year 2.  Start metoprolol succinate 25 mg daily 3.  2D echocardiogram 4.  Start ACE/ARB prior to discharge 5.  Cardiac rehabilitation after discharge 6.  Close cardiology follow-up after discharge   DG Chest Port 1 View  Result Date: 12/11/2022 CLINICAL DATA:  Code STEMI. EXAM: PORTABLE CHEST 1 VIEW COMPARISON:  October 15, 2021 FINDINGS: The heart size and mediastinal contours are within normal limits. A radiopaque stent is seen overlying the superior mediastinum on the left. Both lungs are clear. The visualized skeletal structures are unremarkable. IMPRESSION: No active disease. Electronically Signed   By: Aram Candela M.D.   On: 12/11/2022 01:58    EKG: Sinus rhythm with ST elevation in leads II, III, aVF, V5 and V6  ASSESSMENT AND PLAN:   1.  Inferior STEMI, with occluded proximal RCA, and residual 75% stenosis distal left main, 70% stenosis proximal LAD, 75% stenosis mid LAD, 75% stenosis  OM1, with preserved left ventricular function.  Patient underwent primary PCI with 2.5 x 22 mm Onyx frontier stent proximal/mid RCA, with excellent angiographic result and resolution of chest pain. 2.  Hyperlipidemia, on high intensity atorvastatin 3.  Type 2 diabetes, on insulin  Recommendations  1.  Dual antiplatelet therapy uninterrupted x 1 year 2.  Resume high intensity atorvastatin 3.  Start metoprolol succinate 25 mg daily 4.  2D echocardiogram 5.  Readdress need for CABG in the future  Signed: Marcina Millard MD,PhD, [facility] 12/11/2022, 4:22 AM

## 2022-12-11 NOTE — H&P (Signed)
History and Physical    Patient: Jon [name] AOZ:[id number] DOB: [birth year] DOA: 12/11/2022 DOS: the patient was seen and examined on 12/11/2022 PCP: Agapito Games, MD  Patient coming from: Home  Chief Complaint:  Chief Complaint  Patient presents with   Code STEMI   HPI: Jon [name] is a 77 y.o. male with medical history significant of COPD, nicotine dependence, prior CVA/TIA, left subclavian steal s/p subclavian stent, insulin-dependent type 2 diabetes, HTN who has been admitted to the hospitalist service s/p RCA stent for inferior STEMI called in the field.  Patient had a cardiac cath a year ago when CABG was recommended but he declined.  On the current visit he presented with onset of chest pain lasting 45 minutes radiating to back and left side.  On arrival of EMS he was diaphoretic and pale.  Cardiac cath was uneventful and patient was sent to stepdown in stable condition.  He remains chest pain-free. Review of ED data: First troponin of 168, creatinine 1.73, Up from baseline around 1-0.6.  Labs otherwise unremarkable. Chest x-ray clear EKG, precath, personally reviewed and showing marked ST elevation in inferolateral leads  Review of Systems: As mentioned in the history of present illness. All other systems reviewed and are negative. Past Medical History:  Diagnosis Date   BENIGN POSITIONAL VERTIGO 05/20/2010   Qualifier: Diagnosis of  By: Linford Arnold MD, Ronnette Juniper emphysema [facility])    Cerebrovascular disease 07/15/2018   Prior CVA & TIA. Carotid CTA ~50% Bilateral ICA,  Multifocal severe stenosis of the left V2 vertebral artery.  Right vertebral artery patent without significant stenosis.   Coronary artery disease involving native coronary artery with angina pectoris (HCC) 11/18/2017   Severe LM, LAD & RCA stenosis indicated on Coronary CTA => CaCardiac Cath 01/02/2022: dLM ~50%; mLAD 85%@D1  90% (1,1,1) - m-dLAD 90$ @ 65% D2 (0,1,1), RI 75%, pLCx  30%-OM1 60%, pRCA 25% - p-mRCA hazy 85%. Normal LVEF by Echo, EDP 16 mmHg. = Best option CABGrdiac Cath    Current every day smoker    No interest in quitting   Diabetes mellitus, type II, insulin dependent (HCC)    With peripheral neuropathy, peripheral vascular disease and coronary artery disease as complications.   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 => 12/17/2021: L Subclavia A PTA=Stent with resolution of Sx.   TIA (transient ischemic attack) 11/11/2021   Brief episode of left sided arm pain and slurred speech   Past Surgical History:  Procedure Laterality Date   LEFT HEART CATH AND CORONARY ANGIOGRAPHY N/A 01/02/2022   Procedure: LEFT HEART CATH AND CORONARY ANGIOGRAPHY;  Surgeon: Marykay Lex, MD;  Location: [facility] INVASIVE CV LAB:: dLM ~50%; mLAD 85%@D1  90% (1,1,1) - m-dLAD 90$ @ 65% D2 (0,1,1), RI 75%, pLCx 30%-OM1 60%, pRCA 25% - p-mRCA hazy 85%. Normal LVEF by Echo, EDP 16 mmHg. = Best option CABG   PILONIDAL CYST EXCISION  1960, 61, 62, 63   TRANSTHORACIC ECHOCARDIOGRAM  11/28/2021   EF 55 to 60%.  No RWMA.  Moderate asymmetric LVH-basal septal.  GR 1 DD.  Normal RV.  Normal MV.  AOV sclerosis without stenosis.  Normal RAP.   UPPER EXTREMITY ANGIOGRAPHY Left 12/17/2021   Procedure: Upper Extremity Angiography;  Surgeon: Renford Dills, MD;  Location: [facility] INVASIVE CV LAB;  Service: Cardiovascular:: High-grade stenosis just distal to the origin of the L-Subclavian A, normal Innom A & L Carotid A => Ost-Prox L SubCl A 8x37x80 Stent - dilated with 9 mm balloon.   VASECTOMY     Zio Patch Monitor  11/2021   Predom Rhtyhm: SR - HR range 47-98 bpm, AVG 64 bpm.  3 runs of NS VT  (> 4 PVC beats): Fastest: 5 beats - HR 125-162 bpm, ~ 145 bpm, 2.1 sec;  Longest with Fastest Avg-12 beats, 150-158 bpm, ~ 155 bpm, 4.6 sec.  1  5 beat Atrial Run: HR 125, Avg 120 bpm,   No Sustained Arrhythmias,   Occasional isolated PACs (2.1%) & Rare isolated PVCs (<1%).  No patient triggered events.   Social History:  reports that he quit smoking about 10 months ago. His smoking use included cigarettes. He started smoking about 62 years ago. He has a 62 pack-year smoking history. He has never used smokeless tobacco. He reports that he does not drink alcohol and does not use drugs.  Allergies  Allergen Reactions   Gabapentin Other (See Comments)    Nightmares.    Lisinopril Other (See Comments)    Hypotension   Metformin And Related Diarrhea    Family History  Problem Relation Age of Onset   Diabetes Other        family hx of   Colon cancer Neg Hx     Prior to Admission medications   Medication Sig Start Date End Date Taking? Authorizing Provider  Accu-Chek Softclix Lancets lancets Use to check blood sugars twice daily 01/01/22   Agapito Games, MD  aspirin EC 81 MG tablet Take 1 tablet (81 mg total) by mouth daily. Swallow whole. 12/17/21 12/17/22  Schnier, Latina Craver, MD  atorvastatin (LIPITOR) 80 MG tablet TAKE 1 TABLET BY MOUTH AT  BEDTIME 09/23/22   Agapito Games, MD  bisoprolol (ZEBETA) 5 MG tablet Take 0.5 tablets (2.5 mg total) by mouth daily. 01/16/22   Marykay Lex, MD  Blood Glucose Monitoring Suppl (ACCU-CHEK GUIDE ME) w/Device KIT Check glucose three times daily. Dx. Code E11.8 01/01/22   Agapito Games, MD  clopidogrel (PLAVIX) 75 MG tablet TAKE 1 TABLET BY MOUTH DAILY 06/09/22   Agapito Games, MD  glucose blood (ACCU-CHEK GUIDE) test strip Dx DM E11.9. Check fasting blood sugar every morning. 01/01/22   Agapito Games, MD  insulin degludec (TRESIBA FLEXTOUCH) 100 UNIT/ML FlexTouch Pen Inject 30-35 Units into the skin daily. 12/04/22   Agapito Games, MD  Insulin Pen Needle (EASY TOUCH PEN NEEDLES) 31G  X 8 MM MISC FOR USE WITH INJECTING  INSULIN DAILY 05/01/22   Agapito Games, MD  lisinopril (ZESTRIL) 2.5 MG tablet Take 1 tablet (2.5 mg total) by mouth daily. 09/04/22   Agapito Games, MD  Omega-3 Fatty Acids (FISH OIL) 1000 MG CAPS Take 2 capsules by mouth daily.    [provider]  Semaglutide,0.25 or 0.5MG /DOS, 2 MG/3ML SOPN Inject 0.5 mg into the skin every 7 (seven) days. 12/04/22   Agapito Games, MD  SYNJARDY 12.5-500 MG TABS TAKE 1 TABLET BY MOUTH  TWICE DAILY 09/03/21   Agapito Games, MD  traZODone (DESYREL) 50 MG tablet Take 1.5 tablets (75 mg total) by mouth at bedtime. for sleep 08/04/22   Agapito Games, MD    Physical Exam: Vitals:   12/11/22 0326 12/11/22  1610 12/11/22 0336 12/11/22 0341  BP: 111/67 110/71 115/72 (!) 113/90  Pulse: 75 73 72 68  Resp: 19 18 19  (!) 21  Temp:      TempSrc:      SpO2: 98% 98% 99% 98%  Weight:      Height:       Physical Exam Vitals and nursing note reviewed.  Constitutional:      General: He is not in acute distress. HENT:     Head: Normocephalic and atraumatic.  Cardiovascular:     Rate and Rhythm: Normal rate and regular rhythm.     Heart sounds: Normal heart sounds.  Pulmonary:     Effort: Pulmonary effort is normal.     Breath sounds: Normal breath sounds.  Abdominal:     Palpations: Abdomen is soft.     Tenderness: There is no abdominal tenderness.  Neurological:     Mental Status: Mental status is at baseline.     Data Reviewed: Relevant notes from primary care and specialist visits, past discharge summaries as available in EHR, including Care Everywhere. Prior diagnostic testing as pertinent to current admission diagnoses Updated medications and problem lists for reconciliation ED course, including vitals, labs, imaging, treatment and response to treatment Triage notes, nursing and pharmacy notes and ED provider's notes Notable results as noted in HPI   Assessment and  Plan: * STEMI involving right coronary artery (HCC) S/p RCA stent by Dr. Darrold Junker Orders per cardiology Cardiology to follow  History of stroke Continue antiplatelet and statin  Centrilobular emphysema (HCC) Not acutely exacerbated DuoNebs as needed  CKD stage 3 due to type 2 diabetes mellitus (HCC) Renal function at baseline CKD 3a-b  TOBACCO ABUSE Nicotine patch  Diabetes mellitus, type II, insulin dependent (HCC) Basal insulin with sliding scale coverage      Advance Care Planning:   Code Status: Full Code  Consults: [facility] cardiology  Family Communication: Son and daughter at bedside  Severity of Illness: The appropriate patient status for this patient is INPATIENT. Inpatient status is judged to be reasonable and necessary in order to provide the required intensity of service to ensure the patient's safety. The patient's presenting symptoms, physical exam findings, and initial radiographic and laboratory data in the context of their chronic comorbidities is felt to place them at high risk for further clinical deterioration. Furthermore, it is not anticipated that the patient will be medically stable for discharge from the hospital within 2 midnights of admission.   * I certify that at the point of admission it is my clinical judgment that the patient will require inpatient hospital care spanning beyond 2 midnights from the point of admission due to high intensity of service, high risk for further deterioration and high frequency of surveillance required.*  Author: Andris Baumann, MD 12/11/2022 4:27 AM  For on call review [email or web address].

## 2022-12-11 NOTE — ED Notes (Signed)
Pt reports left arm feels different than the right arm at this time and feels weaker. Provider Dolores Frame made aware.

## 2022-12-11 NOTE — Assessment & Plan Note (Signed)
S/p RCA stent by Dr. Darrold Junker Orders per cardiology Cardiology to follow

## 2022-12-11 NOTE — Progress Notes (Signed)
Same day rounding progress note  Patient seen and examined in ICU.  I agree with assessment and plan dictated by Dr. Para March.  Please see her dictated history and physical for further details.  STEMI Continue dual antiplatelet therapy and high intensity statin Continue metoprolol and lisinopril Pending echo Transferred to PCU  Time spent: 20 minutes

## 2022-12-12 ENCOUNTER — Other Ambulatory Visit: Payer: Self-pay

## 2022-12-12 DIAGNOSIS — J432 Centrilobular emphysema: Secondary | ICD-10-CM | POA: Diagnosis not present

## 2022-12-12 DIAGNOSIS — J449 Chronic obstructive pulmonary disease, unspecified: Secondary | ICD-10-CM | POA: Diagnosis not present

## 2022-12-12 DIAGNOSIS — I2111 ST elevation (STEMI) myocardial infarction involving right coronary artery: Secondary | ICD-10-CM | POA: Diagnosis not present

## 2022-12-12 DIAGNOSIS — F17200 Nicotine dependence, unspecified, uncomplicated: Secondary | ICD-10-CM | POA: Diagnosis not present

## 2022-12-12 DIAGNOSIS — N183 Chronic kidney disease, stage 3 unspecified: Secondary | ICD-10-CM | POA: Diagnosis not present

## 2022-12-12 DIAGNOSIS — E119 Type 2 diabetes mellitus without complications: Secondary | ICD-10-CM | POA: Diagnosis not present

## 2022-12-12 DIAGNOSIS — R531 Weakness: Secondary | ICD-10-CM

## 2022-12-12 DIAGNOSIS — E1122 Type 2 diabetes mellitus with diabetic chronic kidney disease: Secondary | ICD-10-CM | POA: Diagnosis not present

## 2022-12-12 LAB — GLUCOSE, CAPILLARY
Glucose-Capillary: 83 mg/dL (ref 70–99)
Glucose-Capillary: 109 mg/dL — ABNORMAL HIGH (ref 70–99)
Glucose-Capillary: 98 mg/dL (ref 70–99)
Glucose-Capillary: 121 mg/dL — ABNORMAL HIGH (ref 70–99)

## 2022-12-12 LAB — TROPONIN I (HIGH SENSITIVITY): Troponin I (High Sensitivity): >24000 ng/L (ref <18)

## 2022-12-12 MED ORDER — METOPROLOL SUCCINATE ER 25 MG PO TB24
25.0000 mg | ORAL_TABLET | Freq: Every day | ORAL | 0 refills | Status: DC
  Filled 2022-12-12: qty 30, 30d supply, fill #0

## 2022-12-12 MED ORDER — TICAGRELOR 90 MG PO TABS
90.0000 mg | ORAL_TABLET | Freq: Two times a day (BID) | ORAL | 0 refills | Status: DC
  Filled 2022-12-12: qty 60, 30d supply, fill #0

## 2022-12-12 NOTE — Progress Notes (Signed)
Transition of Care [facility]) - Inpatient Brief Assessment   Patient Details  Name: Jon [name] MRN: [id number] Date of Birth: 1945-12-05  Transition of Care [facility]) CM/SW Contact:    Truddie Hidden, RN Phone Number: 12/12/2022, 3:27 PM   Clinical Narrative: TOC assessing for ongoing needs and discharge planning.   Transition of Care Asessment: Insurance and Status: Insurance coverage has been reviewed Patient has primary care physician: Yes Home environment has been reviewed: Retjurn to home Prior level of function:: Independent Prior/Current Home Services: No current home services Social Determinants of Health Reivew: SDOH reviewed no interventions necessary Readmission risk has been reviewed: Yes Transition of care needs: no transition of care needs at this time

## 2022-12-12 NOTE — Telephone Encounter (Signed)
Tell him I am so sorry and that I hope that he is recovering quickly.  He can see if the nurses there would be willing to help him do an enema to help clear out his bowels while he is there.  Or at least put him on some MiraLAX daily while he is there if he is not having to avoid oral intake.

## 2022-12-12 NOTE — Progress Notes (Addendum)
Rounding Note    Patient Name: Jon [name] Date of Encounter: 12/12/2022  [facility] Cardiologist: [name], MD   Subjective   Reports broken sleep last night Denies chest pain today Long discussion concerning cardiac catheterization findings Discussed residual disease of left main, LAD Family at the bedside  Inpatient Medications    Scheduled Meds:  aspirin EC  81 mg Oral Daily   atorvastatin  80 mg Oral QHS   Chlorhexidine Gluconate Cloth  6 each Topical Daily   insulin aspart  0-15 Units Subcutaneous TID WC   insulin aspart  0-5 Units Subcutaneous QHS   insulin glargine-yfgn  20 Units Subcutaneous Daily   lactulose  10 g Oral BID   lisinopril  2.5 mg Oral Daily   metoprolol succinate  25 mg Oral Daily   pantoprazole  40 mg Oral Daily   sodium chloride flush  3 mL Intravenous Q12H   ticagrelor  90 mg Oral BID   traZODone  75 mg Oral QHS   Continuous Infusions:  sodium chloride Stopped (12/11/22 1015)   sodium chloride     PRN Meds: sodium chloride, acetaminophen, alum & mag hydroxide-simeth, bisacodyl, nitroGLYCERIN, ondansetron (ZOFRAN) IV, mouth rinse, sodium chloride flush   Vital Signs    Vitals:   12/11/22 1704 12/11/22 2123 12/11/22 2326 12/12/22 0453  BP: 110/61 102/62 (!) 101/57 102/61  Pulse: 84 60 60 (!) 58  Resp: 16 18 18 18   Temp: 97.8 F (36.6 C) 97.6 F (36.4 C) 98 F (36.7 C) 98.2 F (36.8 C)  TempSrc:  Oral    SpO2:  100% 99% 97%  Weight:      Height:        Intake/Output Summary (Last 24 hours) at 12/12/2022 0959 Last data filed at 12/11/2022 1913 Gross per 24 hour  Intake 703.4 ml  Output 675 ml  Net 28.4 ml      12/11/2022    1:42 AM 12/04/2022   10:35 AM 11/27/2022    1:53 PM  Last 3 Weights  Weight (lbs) 241 lb 10 oz 220 lb 6.4 oz 210 lb  Weight (kg) 109.6 kg 99.973 kg 95.255 kg      Telemetry    Normal sinus rhythm- Personally Reviewed  ECG     - Personally Reviewed  Physical Exam   GEN: No  acute distress.   Neck: No JVD Cardiac: RRR, no murmurs, rubs, or gallops.  Respiratory: Clear to auscultation bilaterally. GI: Soft, nontender, non-distended  MS: No edema; No deformity. Neuro:  Nonfocal  Psych: Normal affect   Labs    High Sensitivity Troponin:   Recent Labs  Lab 12/11/22 0144 12/11/22 0440  TROPONINIHS 168* >24,000*     Chemistry Recent Labs  Lab 12/11/22 0144 12/11/22 0337 12/11/22 0440 12/12/22 0547  NA 135 133* 131* 136  K 3.8 4.7 4.4 4.3  CL 104  --  100 107  CO2 24  --  21* 20*  GLUCOSE 181*  --  127* 94  BUN 22  --  20 23  CREATININE 1.73*  --  1.45* 1.53*  CALCIUM 8.6*  --  8.8* 8.4*  PROT 7.0  --   --   --   ALBUMIN 3.7  --   --   --   AST 18  --   --   --   ALT 19  --   --   --   ALKPHOS 57  --   --   --  BILITOT 0.5  --   --   --   GFRNONAA 40*  --  50* 47*  ANIONGAP 7  --  10 9    Lipids  Recent Labs  Lab 12/11/22 0144  CHOL 109  TRIG 103  HDL 37*  LDLCALC 51  CHOLHDL 2.9    Hematology Recent Labs  Lab 12/11/22 0144 12/11/22 0337 12/11/22 0440 12/12/22 0547  WBC 10.9*  --  10.4 6.2  RBC 4.28  --  4.34 4.02*  HGB 13.5 12.2* 13.7 12.7*  HCT 41.6 36.0* 42.0 38.5*  MCV 97.2  --  96.8 95.8  MCH 31.5  --  31.6 31.6  MCHC 32.5  --  32.6 33.0  RDW 11.8  --  11.8 12.1  PLT 158  --  158 132*   Thyroid No results for input(s): "TSH", "FREET4" in the last 168 hours.  BNPNo results for input(s): "BNP", "PROBNP" in the last 168 hours.  DDimer No results for input(s): "DDIMER" in the last 168 hours.   Radiology    ECHOCARDIOGRAM COMPLETE  Result Date: 12/11/2022    ECHOCARDIOGRAM REPORT   Patient Name:   Jon [name] Date of Exam: 12/11/2022 Medical Rec #:  [id number]           Height:       74.0 in Accession #:    [id number]          Weight:       241.6 lb Date of Birth:  [birth year]            BSA:          2.355 m Patient Age:    77 years            BP:           92/69 mmHg Patient Gender: M                   HR:            60 bpm. Exam Location:  [facility] Procedure: 2D Echo, Cardiac Doppler, Color Doppler and Strain Analysis Indications:     Acute MI  History:         Patient has prior history of Echocardiogram examinations, most                  recent 11/28/2021. Acute MI and CAD, TIA and Stroke,                  Signs/Symptoms:Shortness of Breath; Risk Factors:Hypertension,                  Diabetes, Dyslipidemia and Current Smoker. CKD.  Sonographer:     Mikki Harbor Referring Phys:  [id number] LILY MICHELLE TANG Diagnosing Phys: Marcina Millard MD  Sonographer Comments: Global longitudinal strain was attempted. IMPRESSIONS  1. Left ventricular ejection fraction, by estimation, is 50 to 55%. The left ventricle has low normal function. The left ventricle demonstrates regional wall motion abnormalities (see scoring diagram/findings for description). Left ventricular diastolic  parameters are consistent with Grade I diastolic dysfunction (impaired relaxation).  2. Right ventricular systolic function is normal. The right ventricular size is normal.  3. The mitral valve is normal in structure. Mild mitral valve regurgitation. No evidence of mitral stenosis.  4. The aortic valve is normal in structure. Aortic valve regurgitation is not visualized. No aortic stenosis is present.  5. The inferior vena cava is normal in size with greater than 50% respiratory variability,  suggesting right atrial pressure of 3 mmHg. FINDINGS  Left Ventricle: Left ventricular ejection fraction, by estimation, is 50 to 55%. The left ventricle has low normal function. The left ventricle demonstrates regional wall motion abnormalities. The left ventricular internal cavity size was normal in size. There is no left ventricular hypertrophy. Left ventricular diastolic parameters are consistent with Grade I diastolic dysfunction (impaired relaxation).  LV Wall Scoring: The mid and distal inferior wall is hypokinetic. Right Ventricle: The right ventricular size is  normal. No increase in right ventricular wall thickness. Right ventricular systolic function is normal. Left Atrium: Left atrial size was normal in size. Right Atrium: Right atrial size was normal in size. Pericardium: There is no evidence of pericardial effusion. Mitral Valve: The mitral valve is normal in structure. Mild mitral valve regurgitation. No evidence of mitral valve stenosis. MV peak gradient, 5.3 mmHg. The mean mitral valve gradient is 1.0 mmHg. Tricuspid Valve: The tricuspid valve is normal in structure. Tricuspid valve regurgitation is mild . No evidence of tricuspid stenosis. Aortic Valve: The aortic valve is normal in structure. Aortic valve regurgitation is not visualized. No aortic stenosis is present. Aortic valve mean gradient measures 2.0 mmHg. Aortic valve peak gradient measures 4.1 mmHg. Aortic valve area, by VTI measures 2.96 cm. Pulmonic Valve: The pulmonic valve was normal in structure. Pulmonic valve regurgitation is not visualized. No evidence of pulmonic stenosis. Aorta: The aortic root is normal in size and structure. Venous: The inferior vena cava is normal in size with greater than 50% respiratory variability, suggesting right atrial pressure of 3 mmHg. IAS/Shunts: No atrial level shunt detected by color flow Doppler.  LEFT VENTRICLE PLAX 2D LVIDd:         5.00 cm LVIDs:         3.40 cm LV PW:         1.40 cm LV IVS:        1.40 cm LVOT diam:     1.90 cm LV SV:         69 LV SV Index:   29 LVOT Area:     2.84 cm  LV Volumes (MOD) LV vol d, MOD A2C: 42.2 ml LV vol d, MOD A4C: 67.2 ml LV vol s, MOD A2C: 22.7 ml LV vol s, MOD A4C: 29.7 ml LV SV MOD A2C:     19.5 ml LV SV MOD A4C:     67.2 ml LV SV MOD BP:      28.1 ml RIGHT VENTRICLE RV Basal diam:  3.60 cm RV Mid diam:    3.20 cm RV S prime:     13.60 cm/s TAPSE (M-mode): 2.9 cm LEFT ATRIUM             Index        RIGHT ATRIUM           Index LA diam:        3.80 cm 1.61 cm/m   RA Area:     14.90 cm LA Vol (A2C):   52.3 ml 22.21  ml/m  RA Volume:   33.30 ml  14.14 ml/m LA Vol (A4C):   48.3 ml 20.51 ml/m LA Biplane Vol: 50.6 ml 21.48 ml/m  AORTIC VALVE                    PULMONIC VALVE AV Area (Vmax):    3.03 cm     PV Vmax:       0.86 m/s AV Area (Vmean):  2.72 cm     PV Peak grad:  3.0 mmHg AV Area (VTI):     2.96 cm AV Vmax:           101.00 cm/s AV Vmean:          68.900 cm/s AV VTI:            0.233 m AV Peak Grad:      4.1 mmHg AV Mean Grad:      2.0 mmHg LVOT Vmax:         108.00 cm/s LVOT Vmean:        66.000 cm/s LVOT VTI:          0.243 m LVOT/AV VTI ratio: 1.04  AORTA Ao Root diam: 3.80 cm Ao Asc diam:  3.70 cm MITRAL VALVE MV Area (PHT): 3.17 cm    SHUNTS MV Area VTI:   1.97 cm    Systemic VTI:  0.24 m MV Peak grad:  5.3 mmHg    Systemic Diam: 1.90 cm MV Mean grad:  1.0 mmHg MV Vmax:       1.15 m/s MV Vmean:      51.2 cm/s MV Decel Time: 239 msec MV E velocity: 78.10 cm/s MV A velocity: 83.80 cm/s MV E/A ratio:  0.93 Marcina Millard MD Electronically signed by Marcina Millard MD Signature Date/Time: 12/11/2022/4:22:14 PM    Final    CARDIAC CATHETERIZATION  Result Date: 12/11/2022   1st Mrg lesion is 75% stenosed.   Prox LAD lesion is 70% stenosed.   Mid LAD lesion is 75% stenosed.   Mid LM to Dist LM lesion is 75% stenosed.   Prox RCA lesion is 100% stenosed.   A drug-eluting stent was successfully placed using a STENT ONYX FRONTIER 2.5X22.   Post intervention, there is a 0% residual stenosis.   There is mild left ventricular systolic dysfunction.   LV end diastolic pressure is mildly elevated.   The left ventricular ejection fraction is 45-50% by visual estimate. 1.  Inferior STEMI 2.  Three-vessel coronary artery disease with 75% stenosis distal left main, 70% stenosis proximal LAD, 75% stenosis mid LAD, 95% stenosis OM1, 100% stenosis proximal RCA (culprit vessel) 3.  Mildly reduced left ventricular function with estimated LV ejection fraction 45 to 50% 4.  Successful primary PCI with 2.5 x 22 mm Onyx  frontier drug-eluting stent proximal RCA Recommendations 1.  Dual antiplatelet therapy uninterrupted x 1 year 2.  Start metoprolol succinate 25 mg daily 3.  2D echocardiogram 4.  Start ACE/ARB prior to discharge 5.  Cardiac rehabilitation after discharge 6.  Close cardiology follow-up after discharge   DG Chest Port 1 View  Result Date: 12/11/2022 CLINICAL DATA:  Code STEMI. EXAM: PORTABLE CHEST 1 VIEW COMPARISON:  October 15, 2021 FINDINGS: The heart size and mediastinal contours are within normal limits. A radiopaque stent is seen overlying the superior mediastinum on the left. Both lungs are clear. The visualized skeletal structures are unremarkable. IMPRESSION: No active disease. Electronically Signed   By: Aram Candela M.D.   On: 12/11/2022 01:58    Cardiac Studies     Patient Profile     Jon [name] is a 77 y.o. male with medical history significant of COPD, nicotine dependence, prior CVA/TIA, left subclavian steal s/p subclavian stent, insulin-dependent type 2 diabetes, HTN who has been admitted to the hospitalist service s/p RCA stent for inferior STEMI called in the field.   Assessment & Plan    Inferior STEMI Known  three-vessel coronary disease Treated for occluded proximal RCA, PCI with 2.5 x 22 mm Onyx frontier stent proximal/mid RCA  -On aspirin, Brilinta, high intensity statin, beta-blocker, ARB Residual left main, LAD disease Interventional cardiology has discussed his case and recommended meeting with cardiothoracic surgery for bypass in several months time -The above to be arranged as an outpatient after discussion with Dr. Herbie Baltimore, primary cardiologist -Consider discharge home tomorrow, 48 hours after STEMI, close monitoring of telemetry  Chronic renal sufficiency Stable creatinine 1.5 Continue ARB  COPD/smoker We have encouraged him to continue to work on weaning his cigarettes and smoking cessation. He will continue to work on this and does not want any  assistance with chantix.    Hyperlipidemia Continue Lipitor 80 daily  Type 2 diabetes on insulin with complications A1c elevated 7.8 down from 8.7 in January Followed by primary care   Total encounter time more than 50 minutes  Greater than 50% was spent in counseling and coordination of care with the patient  For questions or updates, please contact [facility] Please consult [email or web address] for contact info under        Signed, Julien Nordmann, MD  12/12/2022, 9:59 AM

## 2022-12-12 NOTE — Plan of Care (Signed)
Problem: Education: Goal: Knowledge of General Education information will improve Description: Including pain rating scale, medication(s)/side effects and non-pharmacologic comfort measures Outcome: Progressing   Problem: Health Behavior/Discharge Planning: Goal: Ability to manage health-related needs will improve Outcome: Progressing   Problem: Clinical Measurements: Goal: Ability to maintain clinical measurements within normal limits will improve Outcome: Progressing Goal: Will remain free from infection Outcome: Progressing Goal: Diagnostic test results will improve Outcome: Progressing Goal: Respiratory complications will improve Outcome: Progressing Goal: Cardiovascular complication will be avoided Outcome: Progressing   Problem: Activity: Goal: Risk for activity intolerance will decrease Outcome: Progressing   Problem: Nutrition: Goal: Adequate nutrition will be maintained Outcome: Progressing   Problem: Coping: Goal: Level of anxiety will decrease Outcome: Progressing   Problem: Elimination: Goal: Will not experience complications related to bowel motility Outcome: Progressing Goal: Will not experience complications related to urinary retention Outcome: Progressing   Problem: Pain Managment: Goal: General experience of comfort will improve Outcome: Progressing   Problem: Safety: Goal: Ability to remain free from injury will improve Outcome: Progressing   Problem: Skin Integrity: Goal: Risk for impaired skin integrity will decrease Outcome: Progressing   Problem: Education: Goal: Understanding of CV disease, CV risk reduction, and recovery process will improve Outcome: Progressing Goal: Individualized Educational Video(s) Outcome: Progressing   Problem: Activity: Goal: Ability to return to baseline activity level will improve Outcome: Progressing   Problem: Cardiovascular: Goal: Ability to achieve and maintain adequate cardiovascular perfusion  will improve Outcome: Progressing Goal: Vascular access site(s) Level 0-1 will be maintained Outcome: Progressing   Problem: Health Behavior/Discharge Planning: Goal: Ability to safely manage health-related needs after discharge will improve Outcome: Progressing   Problem: Education: Goal: Understanding of cardiac disease, CV risk reduction, and recovery process will improve Outcome: Progressing Goal: Individualized Educational Video(s) Outcome: Progressing   Problem: Activity: Goal: Ability to tolerate increased activity will improve Outcome: Progressing   Problem: Cardiac: Goal: Ability to achieve and maintain adequate cardiovascular perfusion will improve Outcome: Progressing   Problem: Health Behavior/Discharge Planning: Goal: Ability to safely manage health-related needs after discharge will improve Outcome: Progressing   Problem: Education: Goal: Ability to describe self-care measures that may prevent or decrease complications (Diabetes Survival Skills Education) will improve Outcome: Progressing Goal: Individualized Educational Video(s) Outcome: Progressing   Problem: Coping: Goal: Ability to adjust to condition or change in health will improve Outcome: Progressing   Problem: Fluid Volume: Goal: Ability to maintain a balanced intake and output will improve Outcome: Progressing   Problem: Health Behavior/Discharge Planning: Goal: Ability to identify and utilize available resources and services will improve Outcome: Progressing Goal: Ability to manage health-related needs will improve Outcome: Progressing   Problem: Metabolic: Goal: Ability to maintain appropriate glucose levels will improve Outcome: Progressing   Problem: Nutritional: Goal: Maintenance of adequate nutrition will improve Outcome: Progressing Goal: Progress toward achieving an optimal weight will improve Outcome: Progressing   Problem: Skin Integrity: Goal: Risk for impaired skin  integrity will decrease Outcome: Progressing   Problem: Tissue Perfusion: Goal: Adequacy of tissue perfusion will improve Outcome: Progressing   Problem: Education: Goal: Understanding of CV disease, CV risk reduction, and recovery process will improve Outcome: Progressing Goal: Individualized Educational Video(s) Outcome: Progressing   Problem: Activity: Goal: Ability to return to baseline activity level will improve Outcome: Progressing   Problem: Cardiovascular: Goal: Ability to achieve and maintain adequate cardiovascular perfusion will improve Outcome: Progressing Goal: Vascular access site(s) Level 0-1 will be maintained Outcome: Progressing   Problem: Health Behavior/Discharge Planning: Goal:  Ability to safely manage health-related needs after discharge will improve Outcome: Progressing

## 2022-12-12 NOTE — Telephone Encounter (Signed)
Jon [name] was given medication in the hospital that cleaned him out. He was happy that Dr Linford Arnold was checking on him.

## 2022-12-12 NOTE — Progress Notes (Signed)
1       [place] [facility]   PATIENT NAME: Jon [name]    MR#:  [id number]  PCP: Agapito Games, MD  DATE OF BIRTH:  November 04, 1945  SUBJECTIVE:  CHIEF COMPLAINT:   Chief Complaint  Patient presents with   Code STEMI  Sitting in the chair.  No new complaints always been pleasant REVIEW OF SYSTEMS:  Review of Systems  All other systems reviewed and are negative.  DRUG ALLERGIES:   Allergies  Allergen Reactions   Gabapentin Other (See Comments)    Nightmares.    Lisinopril Other (See Comments)    Hypotension   Metformin And Related Diarrhea   VITALS:  Blood pressure 98/74, pulse (!) 58, temperature 97.6 F (36.4 C), temperature source Oral, resp. rate 18, height 6\' 2"  (1.88 m), weight 109.6 kg, SpO2 98%. PHYSICAL EXAMINATION:  Physical Exam Vitals and nursing note reviewed.  Constitutional:      Appearance: Normal appearance. He is normal weight.  HENT:     Head: Normocephalic and atraumatic.     Nose: Nose normal.     Mouth/Throat:     Mouth: Mucous membranes are moist.  Eyes:     Extraocular Movements: Extraocular movements intact.     Conjunctiva/sclera: Conjunctivae normal.     Pupils: Pupils are equal, round, and reactive to light.  Cardiovascular:     Rate and Rhythm: Normal rate and regular rhythm.     Pulses: Normal pulses.     Heart sounds: Normal heart sounds.  Pulmonary:     Effort: Pulmonary effort is normal.     Breath sounds: Normal breath sounds.  Abdominal:     General: Bowel sounds are normal.     Palpations: Abdomen is soft.  Musculoskeletal:        General: Normal range of motion.     Cervical back: Normal range of motion and neck supple.  Skin:    General: Skin is warm.  Neurological:     General: No focal deficit present.     Mental Status: He is alert and oriented to person, place, and time. Mental status is at baseline.  Psychiatric:        Mood and Affect: Mood normal.        Thought Content: Thought content  normal.        Judgment: Judgment normal.    LABORATORY PANEL:  Male CBC Recent Labs  Lab 12/12/22 0547  WBC 6.2  HGB 12.7*  HCT 38.5*  PLT 132*   ------------------------------------------------------------------------------------------------------------------ Chemistries  Recent Labs  Lab 12/11/22 0144 12/11/22 0337 12/12/22 0547  NA 135   < > 136  K 3.8   < > 4.3  CL 104   < > 107  CO2 24   < > 20*  GLUCOSE 181*   < > 94  BUN 22   < > 23  CREATININE 1.73*   < > 1.53*  CALCIUM 8.6*   < > 8.4*  AST 18  --   --   ALT 19  --   --   ALKPHOS 57  --   --   BILITOT 0.5  --   --    < > = values in this interval not displayed.   MEDICATIONS:  Scheduled Meds:  aspirin EC  81 mg Oral Daily   atorvastatin  80 mg Oral QHS   Chlorhexidine Gluconate Cloth  6 each Topical Daily   insulin aspart  0-15 Units Subcutaneous TID  WC   insulin aspart  0-5 Units Subcutaneous QHS   insulin glargine-yfgn  20 Units Subcutaneous Daily   lactulose  10 g Oral BID   lisinopril  2.5 mg Oral Daily   metoprolol succinate  25 mg Oral Daily   pantoprazole  40 mg Oral Daily   sodium chloride flush  3 mL Intravenous Q12H   ticagrelor  90 mg Oral BID   traZODone  75 mg Oral QHS   Continuous Infusions:  sodium chloride Stopped (12/11/22 1015)   sodium chloride     RADIOLOGY:  No results found. ASSESSMENT AND PLAN:  No notes on file  Principal Problem:   STEMI involving right coronary artery (HCC) Active Problems:   Diabetes mellitus, type II, insulin dependent (HCC)   TOBACCO ABUSE   CKD stage 3 due to type 2 diabetes mellitus (HCC)   Centrilobular emphysema (HCC)   History of stroke   STEMI involving oth coronary artery of inferior wall (HCC)   Weakness generalized  Inferior STEMI Treated for occluded proximal RCA with stent Continue aspirin, Brilinta, high intensity statin, beta-blocker, ARB Outpatient follow-up with Dr. Herbie Baltimore for ongoing discussion for CABG in several  months  History of stroke Continue antiplatelet and statin   Centrilobular emphysema (HCC) Not acutely exacerbated DuoNebs as needed   CKD stage 3 due to type 2 diabetes mellitus (HCC) Renal function at baseline CKD 3a   TOBACCO ABUSE Nicotine patch   Diabetes mellitus, type II, insulin dependent (HCC) Basal insulin with sliding scale coverage     Body mass index is 31.02 kg/m.  Net IO Since Admission: 630.36 mL [12/12/22 1501]      LOS: 1 day   Consultants: Cardiology  Status is: Inpatient Remains inpatient appropriate because: Management of inferior STEMI   DVT prophylaxis:       SCDs Start: 12/11/22 0432     Family Communication: Grandson updated at bedside   All the records are reviewed and case discussed with Nursing and TOC team. Management plans discussed with the patient, family and they are in agreement.  CODE STATUS: Full Code Level of care: Progressive  TOTAL TIME TAKING CARE OF THIS PATIENT: 35 minutes.   More than 50% of the time was spent in counseling/coordination of care: YES  POSSIBLE D/C IN 1 DAYS, DEPENDING ON CLINICAL CONDITION.   Delfino Lovett M.D on 12/12/2022 at 3:01 PM  [facility]   CC: Primary care physician; Agapito Games, MD  Note: This dictation was prepared with Dragon dictation along with smaller phrase technology. Any transcriptional errors that result from this process are unintentional.

## 2022-12-13 DIAGNOSIS — I2111 ST elevation (STEMI) myocardial infarction involving right coronary artery: Secondary | ICD-10-CM | POA: Diagnosis not present

## 2022-12-13 DIAGNOSIS — J432 Centrilobular emphysema: Secondary | ICD-10-CM | POA: Diagnosis not present

## 2022-12-13 DIAGNOSIS — E1122 Type 2 diabetes mellitus with diabetic chronic kidney disease: Secondary | ICD-10-CM | POA: Diagnosis not present

## 2022-12-13 DIAGNOSIS — R531 Weakness: Secondary | ICD-10-CM | POA: Diagnosis not present

## 2022-12-13 DIAGNOSIS — E119 Type 2 diabetes mellitus without complications: Secondary | ICD-10-CM | POA: Diagnosis not present

## 2022-12-13 LAB — CBC
HCT: 33.7 % — ABNORMAL LOW (ref 39.0–52.0)
Hemoglobin: 11.7 g/dL — ABNORMAL LOW (ref 13.0–17.0)
MCH: 32.1 pg (ref 26.0–34.0)
MCHC: 34.7 g/dL (ref 30.0–36.0)
MCV: 92.3 fL (ref 80.0–100.0)
Platelets: 144 10*3/uL — ABNORMAL LOW (ref 150–400)
RBC: 3.65 MIL/uL — ABNORMAL LOW (ref 4.22–5.81)
RDW: 12 % (ref 11.5–15.5)
WBC: 4.8 10*3/uL (ref 4.0–10.5)
nRBC: 0 % (ref 0.0–0.2)

## 2022-12-13 LAB — GLUCOSE, CAPILLARY
Glucose-Capillary: 115 mg/dL — ABNORMAL HIGH (ref 70–99)
Glucose-Capillary: 110 mg/dL — ABNORMAL HIGH (ref 70–99)
Glucose-Capillary: 123 mg/dL — ABNORMAL HIGH (ref 70–99)
Glucose-Capillary: 129 mg/dL — ABNORMAL HIGH (ref 70–99)

## 2022-12-13 LAB — BASIC METABOLIC PANEL
Anion gap: 8 (ref 5–15)
BUN: 28 mg/dL — ABNORMAL HIGH (ref 8–23)
CO2: 20 mmol/L — ABNORMAL LOW (ref 22–32)
Calcium: 8.1 mg/dL — ABNORMAL LOW (ref 8.9–10.3)
Chloride: 108 mmol/L (ref 98–111)
Creatinine, Ser: 1.64 mg/dL — ABNORMAL HIGH (ref 0.61–1.24)
GFR, Estimated: 43 mL/min — ABNORMAL LOW (ref >60)
Glucose, Bld: 104 mg/dL — ABNORMAL HIGH (ref 70–99)
Potassium: 4.3 mmol/L (ref 3.5–5.1)
Sodium: 136 mmol/L (ref 135–145)

## 2022-12-13 MED ORDER — SODIUM CHLORIDE 0.9 % IV SOLN: INTRAVENOUS | Status: DC

## 2022-12-13 MED ORDER — METOPROLOL SUCCINATE ER 25 MG PO TB24: 12.5000 mg | ORAL_TABLET | Freq: Every day | ORAL | Status: DC

## 2022-12-13 NOTE — Plan of Care (Signed)
  Problem: Pain Managment: Goal: General experience of comfort will improve Outcome: Progressing   Problem: Elimination: Goal: Will not experience complications related to bowel motility Outcome: Progressing Goal: Will not experience complications related to urinary retention Outcome: Progressing   Problem: Coping: Goal: Level of anxiety will decrease Outcome: Progressing   Problem: Clinical Measurements: Goal: Ability to maintain clinical measurements within normal limits will improve Outcome: Progressing Goal: Will remain free from infection Outcome: Progressing Goal: Diagnostic test results will improve Outcome: Progressing Goal: Respiratory complications will improve Outcome: Progressing Goal: Cardiovascular complication will be avoided Outcome: Progressing   Problem: Skin Integrity: Goal: Risk for impaired skin integrity will decrease Outcome: Progressing   Problem: Metabolic: Goal: Ability to maintain appropriate glucose levels will improve Outcome: Progressing

## 2022-12-13 NOTE — Progress Notes (Addendum)
Rounding Note    Patient Name: Jon [name] Date of Encounter: 12/13/2022  [facility] Cardiologist: [name], MD   Subjective   Patient seen on a.m. rounds.  Complaints of dizziness, fatigue, and lightheadedness with reports of hypotension with systolic blood pressures in the 70s and 80s.  Denies any chest pain or shortness of breath.  Change in baseline and discussed findings with patient and patient's son Jon [name] via telephone this morning per patient request.  Inpatient Medications    Scheduled Meds:  aspirin EC  81 mg Oral Daily   atorvastatin  80 mg Oral QHS   Chlorhexidine Gluconate Cloth  6 each Topical Daily   insulin aspart  0-15 Units Subcutaneous TID WC   insulin aspart  0-5 Units Subcutaneous QHS   insulin glargine-yfgn  20 Units Subcutaneous Daily   lactulose  10 g Oral BID   lisinopril  2.5 mg Oral Daily   metoprolol succinate  25 mg Oral Daily   pantoprazole  40 mg Oral Daily   sodium chloride flush  3 mL Intravenous Q12H   ticagrelor  90 mg Oral BID   traZODone  75 mg Oral QHS   Continuous Infusions:  sodium chloride Stopped (12/11/22 1015)   sodium chloride     PRN Meds: sodium chloride, acetaminophen, alum & mag hydroxide-simeth, bisacodyl, nitroGLYCERIN, ondansetron (ZOFRAN) IV, mouth rinse, sodium chloride flush   Vital Signs    Vitals:   12/12/22 2310 12/13/22 0440 12/13/22 0750 12/13/22 0757  BP: (!) 98/52 (!) 99/59 (!) 74/42 (!) 84/47  Pulse: 63 75 68 67  Resp: 20 18 17    Temp: 98.9 F (37.2 C) 98.9 F (37.2 C) 98.3 F (36.8 C)   TempSrc: Oral Oral Oral   SpO2: 100% 97% 95%   Weight:      Height:        Intake/Output Summary (Last 24 hours) at 12/13/2022 0930 Last data filed at 12/12/2022 2128 Gross per 24 hour  Intake 243 ml  Output --  Net 243 ml      12/11/2022    1:42 AM 12/04/2022   10:35 AM 11/27/2022    1:53 PM  Last 3 Weights  Weight (lbs) 241 lb 10 oz 220 lb 6.4 oz 210 lb  Weight (kg) 109.6 kg 99.973 kg  95.255 kg      Telemetry    Sinus rhythm with rates in the 70s- Personally Reviewed  ECG    EKG ordered and pending- Personally Reviewed  Physical Exam   GEN: No acute distress.   Neck: No JVD Cardiac: RRR, no murmurs, rubs, or gallops.  Right radial cath site with gauze and OpSite dressing, no bleeding, bruising, hematoma noted inside 2+ radial pulse Respiratory: Clear to auscultation bilaterally. GI: Soft, nontender, non-distended  MS: No edema; No deformity. Neuro:  Nonfocal  Psych: Normal affect   Labs    High Sensitivity Troponin:   Recent Labs  Lab 12/11/22 0144 12/11/22 0440 12/12/22 1033  TROPONINIHS 168* >24,000* >24,000*     Chemistry Recent Labs  Lab 12/11/22 0144 12/11/22 0337 12/11/22 0440 12/12/22 0547  NA 135 133* 131* 136  K 3.8 4.7 4.4 4.3  CL 104  --  100 107  CO2 24  --  21* 20*  GLUCOSE 181*  --  127* 94  BUN 22  --  20 23  CREATININE 1.73*  --  1.45* 1.53*  CALCIUM 8.6*  --  8.8* 8.4*  PROT 7.0  --   --   --  ALBUMIN 3.7  --   --   --   AST 18  --   --   --   ALT 19  --   --   --   ALKPHOS 57  --   --   --   BILITOT 0.5  --   --   --   GFRNONAA 40*  --  50* 47*  ANIONGAP 7  --  10 9    Lipids  Recent Labs  Lab 12/11/22 0144  CHOL 109  TRIG 103  HDL 37*  LDLCALC 51  CHOLHDL 2.9    Hematology Recent Labs  Lab 12/11/22 0144 12/11/22 0337 12/11/22 0440 12/12/22 0547  WBC 10.9*  --  10.4 6.2  RBC 4.28  --  4.34 4.02*  HGB 13.5 12.2* 13.7 12.7*  HCT 41.6 36.0* 42.0 38.5*  MCV 97.2  --  96.8 95.8  MCH 31.5  --  31.6 31.6  MCHC 32.5  --  32.6 33.0  RDW 11.8  --  11.8 12.1  PLT 158  --  158 132*   Thyroid No results for input(s): "TSH", "FREET4" in the last 168 hours.  BNPNo results for input(s): "BNP", "PROBNP" in the last 168 hours.  DDimer No results for input(s): "DDIMER" in the last 168 hours.   Radiology      Cardiac Studies  TTE 12/11/22 1. Left ventricular ejection fraction, by estimation, is 50 to 55%.  The  left ventricle has low normal function. The left ventricle demonstrates  regional wall motion abnormalities (see scoring diagram/findings for  description). Left ventricular diastolic   parameters are consistent with Grade I diastolic dysfunction (impaired  relaxation).   2. Right ventricular systolic function is normal. The right ventricular  size is normal.   3. The mitral valve is normal in structure. Mild mitral valve  regurgitation. No evidence of mitral stenosis.   4. The aortic valve is normal in structure. Aortic valve regurgitation is  not visualized. No aortic stenosis is present.   5. The inferior vena cava is normal in size with greater than 50%  respiratory variability, suggesting right atrial pressure of 3 mmHg.   LHC 12/11/22   1st Mrg lesion is 75% stenosed.   Prox LAD lesion is 70% stenosed.   Mid LAD lesion is 75% stenosed.   Mid LM to Dist LM lesion is 75% stenosed.   Prox RCA lesion is 100% stenosed.   A drug-eluting stent was successfully placed using a STENT ONYX FRONTIER 2.5X22.   Post intervention, there is a 0% residual stenosis.   There is mild left ventricular systolic dysfunction.   LV end diastolic pressure is mildly elevated.   The left ventricular ejection fraction is 45-50% by visual estimate.   1.  Inferior STEMI 2.  Three-vessel coronary artery disease with 75% stenosis distal left main, 70% stenosis proximal LAD, 75% stenosis mid LAD, 95% stenosis OM1, 100% stenosis proximal RCA (culprit vessel) 3.  Mildly reduced left ventricular function with estimated LV ejection fraction 45 to 50% 4.  Successful primary PCI with 2.5 x 22 mm Onyx frontier drug-eluting stent proximal RCA   Recommendations   1.  Dual antiplatelet therapy uninterrupted x 1 year 2.  Start metoprolol succinate 25 mg daily 3.  2D echocardiogram 4.  Start ACE/ARB prior to discharge 5.  Cardiac rehabilitation after discharge 6.  Close cardiology follow-up after  discharge  Patient Profile     77 y.o. male with a past medical history significant  for COPD, nicotine dependence, prior CVA/TIA, left subclavian steal status post NSTEMI, type 2 insulin-dependent diabetes, hypertension who was admitted to the hospitalist service status post RCA stent.[name]  STEMI called in the field.  Assessment & Plan    Inferior STEMI -Three-vessel coronary artery disease a 75% stenosis of the distal left main, 70% stenosis proximal LAD, 75% stenosis of mid LAD, 95% stenosis to OM1, 100% stenosis proximal RCA which was the culprit vessel leading to STEMI where he underwent successful PCI/DES -Continues to remain chest pain-free -Will need to remain on DAPT uninterrupted for 1 year with aspirin and Brilinta -Continue high intensity statin -Recommendation has been given to patient and his family for cardiothoracic surgery for bypass -Continued on telemetry -EKG ordered for this morning  Dizziness/lightheadedness/fatigue -Patient with complaints this morning due to poor baseline -Hypertension on morning exam -CBC and BMP ordered -Metoprolol and lisinopril discontinued can be revisited in the outpatient setting for restart -Repeat EKG ordered -Orthostatic vitals ordered -Serum creatinine with likely benefit from gentle hydration  Chronic renal insufficiency -Serum creatinine 1.53 on 12/12/2022 -At baseline -Repeat BMP ordered today  COPD/smoker -Smoking cessation is advised  Hyperlipidemia -Continue on atorvastatin 80 mg daily  Type 2 diabetes insulin -A1c elevated 7.8 down from 8.7 in January -Continued on insulin -Management per IM    For questions or updates, please contact [facility] Please consult [email or web address] for contact info under        Signed,  , NP  12/13/2022, 9:30 AM

## 2022-12-13 NOTE — Plan of Care (Signed)
Problem: Education: Goal: Knowledge of General Education information will improve Description: Including pain rating scale, medication(s)/side effects and non-pharmacologic comfort measures Outcome: Progressing   Problem: Health Behavior/Discharge Planning: Goal: Ability to manage health-related needs will improve Outcome: Progressing   Problem: Clinical Measurements: Goal: Ability to maintain clinical measurements within normal limits will improve Outcome: Progressing Goal: Will remain free from infection Outcome: Progressing Goal: Diagnostic test results will improve Outcome: Progressing Goal: Respiratory complications will improve Outcome: Progressing Goal: Cardiovascular complication will be avoided Outcome: Progressing   Problem: Activity: Goal: Risk for activity intolerance will decrease Outcome: Progressing   Problem: Nutrition: Goal: Adequate nutrition will be maintained Outcome: Progressing   Problem: Coping: Goal: Level of anxiety will decrease Outcome: Progressing   Problem: Elimination: Goal: Will not experience complications related to bowel motility Outcome: Progressing Goal: Will not experience complications related to urinary retention Outcome: Progressing   Problem: Pain Managment: Goal: General experience of comfort will improve Outcome: Progressing   Problem: Safety: Goal: Ability to remain free from injury will improve Outcome: Progressing   Problem: Skin Integrity: Goal: Risk for impaired skin integrity will decrease Outcome: Progressing   Problem: Education: Goal: Understanding of CV disease, CV risk reduction, and recovery process will improve Outcome: Progressing Goal: Individualized Educational Video(s) Outcome: Progressing   Problem: Activity: Goal: Ability to return to baseline activity level will improve Outcome: Progressing   Problem: Cardiovascular: Goal: Ability to achieve and maintain adequate cardiovascular perfusion  will improve Outcome: Progressing Goal: Vascular access site(s) Level 0-1 will be maintained Outcome: Progressing   Problem: Health Behavior/Discharge Planning: Goal: Ability to safely manage health-related needs after discharge will improve Outcome: Progressing   Problem: Education: Goal: Understanding of cardiac disease, CV risk reduction, and recovery process will improve Outcome: Progressing Goal: Individualized Educational Video(s) Outcome: Progressing   Problem: Activity: Goal: Ability to tolerate increased activity will improve Outcome: Progressing   Problem: Cardiac: Goal: Ability to achieve and maintain adequate cardiovascular perfusion will improve Outcome: Progressing   Problem: Health Behavior/Discharge Planning: Goal: Ability to safely manage health-related needs after discharge will improve Outcome: Progressing   Problem: Education: Goal: Ability to describe self-care measures that may prevent or decrease complications (Diabetes Survival Skills Education) will improve Outcome: Progressing Goal: Individualized Educational Video(s) Outcome: Progressing   Problem: Coping: Goal: Ability to adjust to condition or change in health will improve Outcome: Progressing   Problem: Fluid Volume: Goal: Ability to maintain a balanced intake and output will improve Outcome: Progressing   Problem: Health Behavior/Discharge Planning: Goal: Ability to identify and utilize available resources and services will improve Outcome: Progressing Goal: Ability to manage health-related needs will improve Outcome: Progressing   Problem: Metabolic: Goal: Ability to maintain appropriate glucose levels will improve Outcome: Progressing   Problem: Nutritional: Goal: Maintenance of adequate nutrition will improve Outcome: Progressing Goal: Progress toward achieving an optimal weight will improve Outcome: Progressing   Problem: Skin Integrity: Goal: Risk for impaired skin  integrity will decrease Outcome: Progressing   Problem: Tissue Perfusion: Goal: Adequacy of tissue perfusion will improve Outcome: Progressing   Problem: Education: Goal: Understanding of CV disease, CV risk reduction, and recovery process will improve Outcome: Progressing Goal: Individualized Educational Video(s) Outcome: Progressing   Problem: Activity: Goal: Ability to return to baseline activity level will improve Outcome: Progressing   Problem: Cardiovascular: Goal: Ability to achieve and maintain adequate cardiovascular perfusion will improve Outcome: Progressing Goal: Vascular access site(s) Level 0-1 will be maintained Outcome: Progressing   Problem: Health Behavior/Discharge Planning: Goal:  Ability to safely manage health-related needs after discharge will improve Outcome: Progressing

## 2022-12-13 NOTE — Progress Notes (Signed)
1       [place] [facility]   PATIENT NAME: Jon [name]    MR#:  [id number]  PCP: Agapito Games, MD  DATE OF BIRTH:  01-Jun-1945  SUBJECTIVE:  CHIEF COMPLAINT:   Chief Complaint  Patient presents with   Code STEMI  Not feeling well at all.  Feels very dizzy, tired.  He was hypotensive in the morning with systolic blood pressure in 70s to 80s This is a big change from yesterday REVIEW OF SYSTEMS:  Review of Systems  Neurological:  Positive for dizziness and weakness.  All other systems reviewed and are negative.  DRUG ALLERGIES:   Allergies  Allergen Reactions   Gabapentin Other (See Comments)    Nightmares.    Lisinopril Other (See Comments)    Hypotension   Metformin And Related Diarrhea   VITALS:  Blood pressure (!) 95/56, pulse 69, temperature 98.1 F (36.7 C), temperature source Oral, resp. rate 19, height 6\' 2"  (1.88 m), weight 109.6 kg, SpO2 98%. PHYSICAL EXAMINATION:  Physical Exam Vitals and nursing note reviewed.  Constitutional:      Appearance: Normal appearance. He is normal weight.  HENT:     Head: Normocephalic and atraumatic.     Nose: Nose normal.     Mouth/Throat:     Mouth: Mucous membranes are moist.  Eyes:     Extraocular Movements: Extraocular movements intact.     Conjunctiva/sclera: Conjunctivae normal.     Pupils: Pupils are equal, round, and reactive to light.  Cardiovascular:     Rate and Rhythm: Normal rate and regular rhythm.     Pulses: Normal pulses.     Heart sounds: Normal heart sounds.     Comments: Radial cath site looking clean.  No hematoma Pulmonary:     Effort: Pulmonary effort is normal.     Breath sounds: Normal breath sounds.  Abdominal:     General: Bowel sounds are normal.     Palpations: Abdomen is soft.  Musculoskeletal:        General: Normal range of motion.     Cervical back: Normal range of motion and neck supple.  Skin:    General: Skin is warm.  Neurological:     General: No focal  deficit present.     Mental Status: He is alert and oriented to person, place, and time. Mental status is at baseline.  Psychiatric:        Mood and Affect: Mood normal.        Thought Content: Thought content normal.        Judgment: Judgment normal.    LABORATORY PANEL:  Male CBC Recent Labs  Lab 12/13/22 1013  WBC 4.8  HGB 11.7*  HCT 33.7*  PLT 144*   ------------------------------------------------------------------------------------------------------------------ Chemistries  Recent Labs  Lab 12/11/22 0144 12/11/22 0337 12/13/22 1013  NA 135   < > 136  K 3.8   < > 4.3  CL 104   < > 108  CO2 24   < > 20*  GLUCOSE 181*   < > 104*  BUN 22   < > 28*  CREATININE 1.73*   < > 1.64*  CALCIUM 8.6*   < > 8.1*  AST 18  --   --   ALT 19  --   --   ALKPHOS 57  --   --   BILITOT 0.5  --   --    < > = values in this interval not displayed.  MEDICATIONS:  Scheduled Meds:  aspirin EC  81 mg Oral Daily   atorvastatin  80 mg Oral QHS   Chlorhexidine Gluconate Cloth  6 each Topical Daily   insulin aspart  0-15 Units Subcutaneous TID WC   insulin aspart  0-5 Units Subcutaneous QHS   insulin glargine-yfgn  20 Units Subcutaneous Daily   lactulose  10 g Oral BID   pantoprazole  40 mg Oral Daily   sodium chloride flush  3 mL Intravenous Q12H   ticagrelor  90 mg Oral BID   traZODone  75 mg Oral QHS   Continuous Infusions:  sodium chloride Stopped (12/11/22 1015)   sodium chloride     RADIOLOGY:  No results found. ASSESSMENT AND PLAN:  No notes on file  Principal Problem:   STEMI involving right coronary artery (HCC) Active Problems:   Diabetes mellitus, type II, insulin dependent (HCC)   TOBACCO ABUSE   CKD stage 3 due to type 2 diabetes mellitus (HCC)   Centrilobular emphysema (HCC)   History of stroke   STEMI involving oth coronary artery of inferior wall (HCC)   Weakness generalized  Inferior STEMI Treated for occluded proximal RCA with stent Continue  aspirin, Brilinta, high intensity statin.  Holding metoprolol and lisinopril due to hypotension today Patient is feeling dizzy and tired.  Will start gentle hydration with normal saline at 75 and monitor Outpatient follow-up with Dr. Herbie Baltimore for ongoing discussion for CABG in several months  History of stroke Continue antiplatelet and statin   Centrilobular emphysema (HCC) Not acutely exacerbated DuoNebs as needed   CKD stage 3 due to type 2 diabetes mellitus (HCC) Renal function at baseline CKD 3a   TOBACCO ABUSE Nicotine patch   Diabetes mellitus, type II, insulin dependent (HCC) Basal insulin with sliding scale coverage     Body mass index is 31.02 kg/m.  Net IO Since Admission: 873.36 mL [12/13/22 1330]      LOS: 2 days   Consultants: Cardiology  Status is: Inpatient Remains inpatient appropriate because: Management of inferior STEMI   DVT prophylaxis:       SCDs Start: 12/11/22 0432     Family Communication: Grandson updated at bedside   All the records are reviewed and case discussed with Nursing and TOC team. Management plans discussed with the patient, family and they are in agreement.  CODE STATUS: Full Code Level of care: Progressive  TOTAL TIME TAKING CARE OF THIS PATIENT: 35 minutes.   More than 50% of the time was spent in counseling/coordination of care: YES  POSSIBLE D/C IN 1-2 DAYS, DEPENDING ON CLINICAL CONDITION.   Delfino Lovett M.D on 12/13/2022 at 1:30 PM  [facility]   CC: Primary care physician; Agapito Games, MD  Note: This dictation was prepared with Dragon dictation along with smaller phrase technology. Any transcriptional errors that result from this process are unintentional.

## 2022-12-14 DIAGNOSIS — I2111 ST elevation (STEMI) myocardial infarction involving right coronary artery: Secondary | ICD-10-CM | POA: Diagnosis not present

## 2022-12-14 DIAGNOSIS — J432 Centrilobular emphysema: Secondary | ICD-10-CM | POA: Diagnosis not present

## 2022-12-14 DIAGNOSIS — R531 Weakness: Secondary | ICD-10-CM | POA: Diagnosis not present

## 2022-12-14 DIAGNOSIS — I213 ST elevation (STEMI) myocardial infarction of unspecified site: Secondary | ICD-10-CM | POA: Diagnosis not present

## 2022-12-14 LAB — CBC
HCT: 35.5 % — ABNORMAL LOW (ref 39.0–52.0)
Hemoglobin: 11.9 g/dL — ABNORMAL LOW (ref 13.0–17.0)
MCH: 32 pg (ref 26.0–34.0)
MCHC: 33.5 g/dL (ref 30.0–36.0)
MCV: 95.4 fL (ref 80.0–100.0)
Platelets: 137 10*3/uL — ABNORMAL LOW (ref 150–400)
RBC: 3.72 MIL/uL — ABNORMAL LOW (ref 4.22–5.81)
RDW: 11.9 % (ref 11.5–15.5)
WBC: 5.2 10*3/uL (ref 4.0–10.5)
nRBC: 0 % (ref 0.0–0.2)

## 2022-12-14 LAB — BASIC METABOLIC PANEL WITH GFR
Anion gap: 6 (ref 5–15)
BUN: 24 mg/dL — ABNORMAL HIGH (ref 8–23)
CO2: 19 mmol/L — ABNORMAL LOW (ref 22–32)
Calcium: 8 mg/dL — ABNORMAL LOW (ref 8.9–10.3)
Chloride: 112 mmol/L — ABNORMAL HIGH (ref 98–111)
Creatinine, Ser: 1.62 mg/dL — ABNORMAL HIGH (ref 0.61–1.24)
GFR, Estimated: 44 mL/min — ABNORMAL LOW (ref >60)
Glucose, Bld: 95 mg/dL (ref 70–99)
Potassium: 4.1 mmol/L (ref 3.5–5.1)
Sodium: 137 mmol/L (ref 135–145)

## 2022-12-14 LAB — GLUCOSE, CAPILLARY: Glucose-Capillary: 93 mg/dL (ref 70–99)

## 2022-12-14 NOTE — Progress Notes (Signed)
Rounding Note    Patient Name: Jon [name] Date of Encounter: 12/14/2022  [facility] Cardiologist: [name], MD   Subjective   No further episodes of dizziness. fatigue improved with holding metoprolol and lisinopril.  Inpatient Medications    Scheduled Meds:  aspirin EC  81 mg Oral Daily   atorvastatin  80 mg Oral QHS   Chlorhexidine Gluconate Cloth  6 each Topical Daily   insulin aspart  0-15 Units Subcutaneous TID WC   insulin aspart  0-5 Units Subcutaneous QHS   insulin glargine-yfgn  20 Units Subcutaneous Daily   lactulose  10 g Oral BID   pantoprazole  40 mg Oral Daily   sodium chloride flush  3 mL Intravenous Q12H   ticagrelor  90 mg Oral BID   traZODone  75 mg Oral QHS   Continuous Infusions:  sodium chloride Stopped (12/11/22 1015)   sodium chloride     sodium chloride 75 mL/hr at 12/14/22 0700   PRN Meds: sodium chloride, acetaminophen, alum & mag hydroxide-simeth, bisacodyl, nitroGLYCERIN, ondansetron (ZOFRAN) IV, mouth rinse, sodium chloride flush   Vital Signs    Vitals:   12/13/22 2008 12/14/22 0038 12/14/22 0330 12/14/22 0814  BP: 122/75 113/66 (!) 107/47 127/72  Pulse: 66 71 64 64  Resp: 18  17 18   Temp: 97.9 F (36.6 C) 98.2 F (36.8 C) 98.8 F (37.1 C) 97.9 F (36.6 C)  TempSrc: Oral Oral Oral Oral  SpO2: 100%  98% 99%  Weight:      Height:        Intake/Output Summary (Last 24 hours) at 12/14/2022 1342 Last data filed at 12/14/2022 0507 Gross per 24 hour  Intake 1504.25 ml  Output --  Net 1504.25 ml      12/11/2022    1:42 AM 12/04/2022   10:35 AM 11/27/2022    1:53 PM  Last 3 Weights  Weight (lbs) 241 lb 10 oz 220 lb 6.4 oz 210 lb  Weight (kg) 109.6 kg 99.973 kg 95.255 kg      Telemetry    Sinus rhythm- Personally Reviewed  ECG     - Personally Reviewed  Physical Exam   GEN: No acute distress.   Neck: No JVD Cardiac: RRR, no murmurs, rubs, or gallops.  Respiratory: Clear to auscultation  bilaterally. GI: Soft, nontender, non-distended  MS: No edema; No deformity. Neuro:  Nonfocal  Psych: Normal affect   Labs    High Sensitivity Troponin:   Recent Labs  Lab 12/11/22 0144 12/11/22 0440 12/12/22 1033  TROPONINIHS 168* >24,000* >24,000*     Chemistry Recent Labs  Lab 12/11/22 0144 12/11/22 0337 12/12/22 0547 12/13/22 1013 12/14/22 0351  NA 135   < > 136 136 137  K 3.8   < > 4.3 4.3 4.1  CL 104   < > 107 108 112*  CO2 24   < > 20* 20* 19*  GLUCOSE 181*   < > 94 104* 95  BUN 22   < > 23 28* 24*  CREATININE 1.73*   < > 1.53* 1.64* 1.62*  CALCIUM 8.6*   < > 8.4* 8.1* 8.0*  PROT 7.0  --   --   --   --   ALBUMIN 3.7  --   --   --   --   AST 18  --   --   --   --   ALT 19  --   --   --   --  ALKPHOS 57  --   --   --   --   BILITOT 0.5  --   --   --   --   GFRNONAA 40*   < > 47* 43* 44*  ANIONGAP 7   < > 9 8 6    < > = values in this interval not displayed.    Lipids  Recent Labs  Lab 12/11/22 0144  CHOL 109  TRIG 103  HDL 37*  LDLCALC 51  CHOLHDL 2.9    Hematology Recent Labs  Lab 12/12/22 0547 12/13/22 1013 12/14/22 0351  WBC 6.2 4.8 5.2  RBC 4.02* 3.65* 3.72*  HGB 12.7* 11.7* 11.9*  HCT 38.5* 33.7* 35.5*  MCV 95.8 92.3 95.4  MCH 31.6 32.1 32.0  MCHC 33.0 34.7 33.5  RDW 12.1 12.0 11.9  PLT 132* 144* 137*   Thyroid No results for input(s): "TSH", "FREET4" in the last 168 hours.  BNPNo results for input(s): "BNP", "PROBNP" in the last 168 hours.  DDimer No results for input(s): "DDIMER" in the last 168 hours.   Radiology    No results found.  Cardiac Studies   Echo EF 50 to 55%  Patient Profile     77 y.o. male with history of CAD, hypertension, diabetes, former smoker x 50+ years complaint of weakness, evaluated by EMS, diagnosed with inferior STEMI.   Assessment & Plan    1.  Inferior STEMI s/p DES to proximal RCA -Mid LAD 75% stenosis, OM1 75% stenosis. -Continue aspirin, Brilinta, Lipitor 80 -EF 50 to 55% -Outpatient  follow-up with CT surgery for CABG consideration   2.  Hypotension, fatigue -Resolved after holding lisinopril, Toprol-XL -Continue to hold lisinopril and Toprol-XL on discharge. -Restart as BP permits on outpatient basis  Okay for patient to be discharged on current medications, close follow-up as outpatient advised.  Total encounter time more than 50 minutes  Greater than 50% was spent in counseling and coordination of care with the patient    Signed, Debbe Odea, MD  12/14/2022, 1:42 PM

## 2022-12-14 NOTE — Plan of Care (Signed)
Problem: Education: Goal: Knowledge of General Education information will improve Description: Including pain rating scale, medication(s)/side effects and non-pharmacologic comfort measures Outcome: Progressing   Problem: Health Behavior/Discharge Planning: Goal: Ability to manage health-related needs will improve Outcome: Progressing   Problem: Clinical Measurements: Goal: Ability to maintain clinical measurements within normal limits will improve Outcome: Progressing Goal: Will remain free from infection Outcome: Progressing Goal: Diagnostic test results will improve Outcome: Progressing Goal: Respiratory complications will improve Outcome: Progressing Goal: Cardiovascular complication will be avoided Outcome: Progressing   Problem: Activity: Goal: Risk for activity intolerance will decrease Outcome: Progressing   Problem: Nutrition: Goal: Adequate nutrition will be maintained Outcome: Progressing   Problem: Coping: Goal: Level of anxiety will decrease Outcome: Progressing   Problem: Elimination: Goal: Will not experience complications related to bowel motility Outcome: Progressing Goal: Will not experience complications related to urinary retention Outcome: Progressing   Problem: Pain Managment: Goal: General experience of comfort will improve Outcome: Progressing   Problem: Safety: Goal: Ability to remain free from injury will improve Outcome: Progressing   Problem: Skin Integrity: Goal: Risk for impaired skin integrity will decrease Outcome: Progressing   Problem: Education: Goal: Understanding of CV disease, CV risk reduction, and recovery process will improve Outcome: Progressing Goal: Individualized Educational Video(s) Outcome: Progressing   Problem: Activity: Goal: Ability to return to baseline activity level will improve Outcome: Progressing   Problem: Cardiovascular: Goal: Ability to achieve and maintain adequate cardiovascular perfusion  will improve Outcome: Progressing Goal: Vascular access site(s) Level 0-1 will be maintained Outcome: Progressing   Problem: Health Behavior/Discharge Planning: Goal: Ability to safely manage health-related needs after discharge will improve Outcome: Progressing   Problem: Education: Goal: Understanding of cardiac disease, CV risk reduction, and recovery process will improve Outcome: Progressing Goal: Individualized Educational Video(s) Outcome: Progressing   Problem: Activity: Goal: Ability to tolerate increased activity will improve Outcome: Progressing   Problem: Cardiac: Goal: Ability to achieve and maintain adequate cardiovascular perfusion will improve Outcome: Progressing   Problem: Health Behavior/Discharge Planning: Goal: Ability to safely manage health-related needs after discharge will improve Outcome: Progressing   Problem: Education: Goal: Ability to describe self-care measures that may prevent or decrease complications (Diabetes Survival Skills Education) will improve Outcome: Progressing Goal: Individualized Educational Video(s) Outcome: Progressing   Problem: Coping: Goal: Ability to adjust to condition or change in health will improve Outcome: Progressing   Problem: Fluid Volume: Goal: Ability to maintain a balanced intake and output will improve Outcome: Progressing   Problem: Health Behavior/Discharge Planning: Goal: Ability to identify and utilize available resources and services will improve Outcome: Progressing Goal: Ability to manage health-related needs will improve Outcome: Progressing   Problem: Metabolic: Goal: Ability to maintain appropriate glucose levels will improve Outcome: Progressing   Problem: Nutritional: Goal: Maintenance of adequate nutrition will improve Outcome: Progressing Goal: Progress toward achieving an optimal weight will improve Outcome: Progressing   Problem: Skin Integrity: Goal: Risk for impaired skin  integrity will decrease Outcome: Progressing   Problem: Tissue Perfusion: Goal: Adequacy of tissue perfusion will improve Outcome: Progressing   Problem: Education: Goal: Understanding of CV disease, CV risk reduction, and recovery process will improve Outcome: Progressing Goal: Individualized Educational Video(s) Outcome: Progressing   Problem: Activity: Goal: Ability to return to baseline activity level will improve Outcome: Progressing   Problem: Cardiovascular: Goal: Ability to achieve and maintain adequate cardiovascular perfusion will improve Outcome: Progressing Goal: Vascular access site(s) Level 0-1 will be maintained Outcome: Progressing   Problem: Health Behavior/Discharge Planning: Goal:  Ability to safely manage health-related needs after discharge will improve Outcome: Progressing

## 2022-12-14 NOTE — Plan of Care (Signed)
  Problem: Nutritional: Goal: Maintenance of adequate nutrition will improve Outcome: Progressing Goal: Progress toward achieving an optimal weight will improve Outcome: Progressing   Problem: Coping: Goal: Ability to adjust to condition or change in health will improve Outcome: Progressing   Problem: Fluid Volume: Goal: Ability to maintain a balanced intake and output will improve Outcome: Progressing   Problem: Education: Goal: Ability to describe self-care measures that may prevent or decrease complications (Diabetes Survival Skills Education) will improve Outcome: Progressing Goal: Individualized Educational Video(s) Outcome: Progressing   Problem: Cardiac: Goal: Ability to achieve and maintain adequate cardiovascular perfusion will improve Outcome: Progressing   Problem: Activity: Goal: Ability to tolerate increased activity will improve Outcome: Progressing   Problem: Pain Managment: Goal: General experience of comfort will improve Outcome: Progressing   Problem: Clinical Measurements: Goal: Ability to maintain clinical measurements within normal limits will improve Outcome: Progressing Goal: Will remain free from infection Outcome: Progressing Goal: Diagnostic test results will improve Outcome: Progressing Goal: Respiratory complications will improve Outcome: Progressing Goal: Cardiovascular complication will be avoided Outcome: Progressing

## 2022-12-15 ENCOUNTER — Ambulatory Visit: Payer: Medicare Other | Attending: Family Medicine | Admitting: Physical Therapy

## 2022-12-15 NOTE — Discharge Summary (Signed)
Physician Discharge Summary   Patient: Jon [name] MRN: [id number] DOB: 1945-10-23  Admit date:     12/11/2022  Discharge date: 12/14/2022  Discharge Physician: Jon [name]   PCP: Jon Games, MD   Recommendations at discharge:    F/up with outpt providers as requested  Discharge Diagnoses: Principal Problem:   STEMI involving right coronary artery [facility]) Active Problems:   Diabetes mellitus, type II, insulin dependent (HCC)   TOBACCO ABUSE   CKD stage 3 due to type 2 diabetes mellitus (HCC)   Centrilobular emphysema (HCC)   History of stroke   STEMI involving oth coronary artery of inferior wall (HCC)   Weakness generalized  Hospital Course: Assessment and Plan:  77 y.o. male with history of CAD, hypertension, diabetes, former smoker x 50+ years complaint of weakness admitted with inferior STEMI.    Inferior STEMI Treated for occluded proximal RCA with stent Continue aspirin, Brilinta, high intensity statin.  Holding metoprolol and lisinopril at DC due to hypotension  Outpatient follow-up with Dr. Herbie [name] & CT surgery for ongoing discussion for CABG in several months  Hypotension, fatigue -Resolved after holding lisinopril, Toprol-XL -Continue to hold lisinopril and Toprol-XL at discharge. -Consider restart as BP permits on outpatient basis   History of stroke Continue antiplatelet and statin   Centrilobular emphysema (HCC) Not acutely exacerbated   CKD stage 3 due to type 2 diabetes mellitus (HCC) Renal function at baseline CKD 3a   TOBACCO ABUSE Nicotine patch   Diabetes mellitus, type II, insulin dependent (HCC)         Consultants: Cardio Procedures performed: Cardiac Cath  Disposition: Home Diet recommendation:  Discharge Diet Orders (From admission, onward)     Start     Ordered   12/14/22 0000  Diet - low sodium heart healthy        12/14/22 1207           Carb modified diet DISCHARGE MEDICATION: Allergies as of 12/14/2022        Reactions   Gabapentin Other (See Comments)   Nightmares.    Lisinopril Other (See Comments)   Hypotension   Metformin And Related Diarrhea        Medication List     STOP taking these medications    bisoprolol 5 MG tablet Commonly known as: ZEBETA   clopidogrel 75 MG tablet Commonly known as: PLAVIX   lisinopril 2.5 MG tablet Commonly known as: ZESTRIL       TAKE these medications    Accu-Chek Guide Me w/Device Kit Check glucose three times daily. Dx. Code E11.8   Accu-Chek Guide test strip Generic drug: glucose blood Dx DM E11.9. Check fasting blood sugar every morning.   Accu-Chek Softclix Lancets lancets Use to check blood sugars twice daily   aspirin EC 81 MG tablet Take 1 tablet (81 mg total) by mouth daily. Swallow whole.   atorvastatin 80 MG tablet Commonly known as: LIPITOR TAKE 1 TABLET BY MOUTH AT  BEDTIME   Brilinta 90 MG Tabs tablet Generic drug: ticagrelor Take 1 tablet (90 mg total) by mouth 2 (two) times daily.   Semaglutide(0.25 or 0.5MG /DOS) 2 MG/3ML Sopn Inject 0.5 mg into the skin every 7 (seven) days.   Synjardy 12.5-500 MG Tabs Generic drug: Empagliflozin-metFORMIN HCl TAKE 1 TABLET BY MOUTH  TWICE DAILY   traZODone 50 MG tablet Commonly known as: DESYREL Take 1.5 tablets (75 mg total) by mouth at bedtime. for sleep What changed: how much to take  Jon [name] FlexTouch 100 UNIT/ML FlexTouch Pen Generic drug: insulin degludec Inject 30-35 Units into the skin daily. What changed:  how much to take when to take this        Follow-up Information     Jon Games, MD. Schedule an appointment as soon as possible for a visit in 1 week(s).   Specialty: Family Medicine Why: [facility] Discharge F/UP Contact information: [address] [place] [address] [phone]         Jon Lex, MD. Schedule an appointment as soon as possible for a visit in 2 week(s).   Specialty:  Cardiology Why: [facility] Discharge F/UP Contact information: [address] [place] [address] [place] [address] [phone]                Discharge Exam: Jon [name] Weights   12/11/22 0142  Weight: 109.6 kg   Constitutional:      Appearance: Normal appearance. He is normal weight.  HENT:     Head: Normocephalic and atraumatic.     Nose: Nose normal.     Mouth/Throat:     Mouth: Mucous membranes are moist.  Eyes:     Extraocular Movements: Extraocular movements intact.     Conjunctiva/sclera: Conjunctivae normal.     Pupils: Pupils are equal, round, and reactive to light.  Cardiovascular:     Rate and Rhythm: Normal rate and regular rhythm.     Pulses: Normal pulses.     Heart sounds: Normal heart sounds.    Pulmonary:     Effort: Pulmonary effort is normal.     Breath sounds: Normal breath sounds.  Abdominal:     General: Bowel sounds are normal.     Palpations: Abdomen is soft.  Musculoskeletal:        General: Normal range of motion.     Cervical back: Normal range of motion and neck supple.  Skin:    General: Skin is warm.  Neurological:     General: No focal deficit present.     Mental Status: He is alert and oriented to person, place, and time. Mental status is at baseline.  Psychiatric:        Mood and Affect: Mood normal.        Thought Content: Thought content normal.        Judgment: Judgment normal.     Condition at discharge: good  The results of significant diagnostics from this hospitalization (including imaging, microbiology, ancillary and laboratory) are listed below for reference.   Imaging Studies: ECHOCARDIOGRAM COMPLETE  Result Date: 12/11/2022    ECHOCARDIOGRAM REPORT   Patient Name:   Jon [name] Date of Exam: 12/11/2022 Medical Rec #:  [id number]           Height:       74.0 in Accession #:    [id number]          Weight:       241.6 lb Date of Birth:  02-19-1946            BSA:          2.355 m Patient Age:    77 years             BP:           92/69 mmHg Patient Gender: M                   HR:  60 bpm. Exam Location:  [facility] Procedure: 2D Echo, Cardiac Doppler, Color Doppler and Strain Analysis Indications:     Acute MI  History:         Patient has prior history of Echocardiogram examinations, most                  recent 11/28/2021. Acute MI and CAD, TIA and Stroke,                  Signs/Symptoms:Shortness of Breath; Risk Factors:Hypertension,                  Diabetes, Dyslipidemia and Current Smoker. CKD.  Sonographer:     Mikki Harbor Referring Phys:  [id number] LILY MICHELLE TANG Diagnosing Phys: Marcina Millard MD  Sonographer Comments: Global longitudinal strain was attempted. IMPRESSIONS  1. Left ventricular ejection fraction, by estimation, is 50 to 55%. The left ventricle has low normal function. The left ventricle demonstrates regional wall motion abnormalities (see scoring diagram/findings for description). Left ventricular diastolic  parameters are consistent with Grade I diastolic dysfunction (impaired relaxation).  2. Right ventricular systolic function is normal. The right ventricular size is normal.  3. The mitral valve is normal in structure. Mild mitral valve regurgitation. No evidence of mitral stenosis.  4. The aortic valve is normal in structure. Aortic valve regurgitation is not visualized. No aortic stenosis is present.  5. The inferior vena cava is normal in size with greater than 50% respiratory variability, suggesting right atrial pressure of 3 mmHg. FINDINGS  Left Ventricle: Left ventricular ejection fraction, by estimation, is 50 to 55%. The left ventricle has low normal function. The left ventricle demonstrates regional wall motion abnormalities. The left ventricular internal cavity size was normal in size. There is no left ventricular hypertrophy. Left ventricular diastolic parameters are consistent with Grade I diastolic dysfunction (impaired relaxation).  LV Wall Scoring: The mid and distal  inferior wall is hypokinetic. Right Ventricle: The right ventricular size is normal. No increase in right ventricular wall thickness. Right ventricular systolic function is normal. Left Atrium: Left atrial size was normal in size. Right Atrium: Right atrial size was normal in size. Pericardium: There is no evidence of pericardial effusion. Mitral Valve: The mitral valve is normal in structure. Mild mitral valve regurgitation. No evidence of mitral valve stenosis. MV peak gradient, 5.3 mmHg. The mean mitral valve gradient is 1.0 mmHg. Tricuspid Valve: The tricuspid valve is normal in structure. Tricuspid valve regurgitation is mild . No evidence of tricuspid stenosis. Aortic Valve: The aortic valve is normal in structure. Aortic valve regurgitation is not visualized. No aortic stenosis is present. Aortic valve mean gradient measures 2.0 mmHg. Aortic valve peak gradient measures 4.1 mmHg. Aortic valve area, by VTI measures 2.96 cm. Pulmonic Valve: The pulmonic valve was normal in structure. Pulmonic valve regurgitation is not visualized. No evidence of pulmonic stenosis. Aorta: The aortic root is normal in size and structure. Venous: The inferior vena cava is normal in size with greater than 50% respiratory variability, suggesting right atrial pressure of 3 mmHg. IAS/Shunts: No atrial level shunt detected by color flow Doppler.  LEFT VENTRICLE PLAX 2D LVIDd:         5.00 cm LVIDs:         3.40 cm LV PW:         1.40 cm LV IVS:        1.40 cm LVOT diam:     1.90 cm LV SV:  69 LV SV Index:   29 LVOT Area:     2.84 cm  LV Volumes (MOD) LV vol d, MOD A2C: 42.2 ml LV vol d, MOD A4C: 67.2 ml LV vol s, MOD A2C: 22.7 ml LV vol s, MOD A4C: 29.7 ml LV SV MOD A2C:     19.5 ml LV SV MOD A4C:     67.2 ml LV SV MOD BP:      28.1 ml RIGHT VENTRICLE RV Basal diam:  3.60 cm RV Mid diam:    3.20 cm RV S prime:     13.60 cm/s TAPSE (M-mode): 2.9 cm LEFT ATRIUM             Index        RIGHT ATRIUM           Index LA diam:         3.80 cm 1.61 cm/m   RA Area:     14.90 cm LA Vol (A2C):   52.3 ml 22.21 ml/m  RA Volume:   33.30 ml  14.14 ml/m LA Vol (A4C):   48.3 ml 20.51 ml/m LA Biplane Vol: 50.6 ml 21.48 ml/m  AORTIC VALVE                    PULMONIC VALVE AV Area (Vmax):    3.03 cm     PV Vmax:       0.86 m/s AV Area (Vmean):   2.72 cm     PV Peak grad:  3.0 mmHg AV Area (VTI):     2.96 cm AV Vmax:           101.00 cm/s AV Vmean:          68.900 cm/s AV VTI:            0.233 m AV Peak Grad:      4.1 mmHg AV Mean Grad:      2.0 mmHg LVOT Vmax:         108.00 cm/s LVOT Vmean:        66.000 cm/s LVOT VTI:          0.243 m LVOT/AV VTI ratio: 1.04  AORTA Ao Root diam: 3.80 cm Ao Asc diam:  3.70 cm MITRAL VALVE MV Area (PHT): 3.17 cm    SHUNTS MV Area VTI:   1.97 cm    Systemic VTI:  0.24 m MV Peak grad:  5.3 mmHg    Systemic Diam: 1.90 cm MV Mean grad:  1.0 mmHg MV Vmax:       1.15 m/s MV Vmean:      51.2 cm/s MV Decel Time: 239 msec MV E velocity: 78.10 cm/s MV A velocity: 83.80 cm/s MV E/A ratio:  0.93 Marcina Millard MD Electronically signed by Marcina Millard MD Signature Date/Time: 12/11/2022/4:22:14 PM    Final    CARDIAC CATHETERIZATION  Result Date: 12/11/2022   1st Mrg lesion is 75% stenosed.   Prox LAD lesion is 70% stenosed.   Mid LAD lesion is 75% stenosed.   Mid LM to Dist LM lesion is 75% stenosed.   Prox RCA lesion is 100% stenosed.   A drug-eluting stent was successfully placed using a STENT ONYX FRONTIER 2.5X22.   Post intervention, there is a 0% residual stenosis.   There is mild left ventricular systolic dysfunction.   LV end diastolic pressure is mildly elevated.   The left ventricular ejection fraction is 45-50% by visual estimate. 1.  Inferior STEMI 2.  Three-vessel coronary artery disease with 75% stenosis distal left main, 70% stenosis proximal LAD, 75% stenosis mid LAD, 95% stenosis OM1, 100% stenosis proximal RCA (culprit vessel) 3.  Mildly reduced left ventricular function with estimated LV ejection  fraction 45 to 50% 4.  Successful primary PCI with 2.5 x 22 mm Onyx frontier drug-eluting stent proximal RCA Recommendations 1.  Dual antiplatelet therapy uninterrupted x 1 year 2.  Start metoprolol succinate 25 mg daily 3.  2D echocardiogram 4.  Start ACE/ARB prior to discharge 5.  Cardiac rehabilitation after discharge 6.  Close cardiology follow-up after discharge   DG Chest Port 1 View  Result Date: 12/11/2022 CLINICAL DATA:  Code STEMI. EXAM: PORTABLE CHEST 1 VIEW COMPARISON:  October 15, 2021 FINDINGS: The heart size and mediastinal contours are within normal limits. A radiopaque stent is seen overlying the superior mediastinum on the left. Both lungs are clear. The visualized skeletal structures are unremarkable. IMPRESSION: No active disease. Electronically Signed   By: Aram Candela M.D.   On: 12/11/2022 01:58    Microbiology: Results for orders placed or performed during the hospital encounter of 12/11/22  MRSA Next Gen by PCR, Nasal     Status: None   Collection Time: 12/11/22  4:25 AM   Specimen: Nasal Mucosa; Nasal Swab  Result Value Ref Range Status   MRSA by PCR Next Gen NOT DETECTED NOT DETECTED Final    Comment: (NOTE) The GeneXpert MRSA Assay (FDA approved for NASAL specimens only), is one component of a comprehensive MRSA colonization surveillance program. It is not intended to diagnose MRSA infection nor to guide or monitor treatment for MRSA infections. Test performance is not FDA approved in patients less than 56 years old. Performed at [facility], [address] [place], [place] [address]     Labs: CBC: Recent Labs  Lab 12/11/22 0144 12/11/22 6045 12/11/22 0440 12/12/22 0547 12/13/22 1013 12/14/22 0351  WBC 10.9*  --  10.4 6.2 4.8 5.2  NEUTROABS 8.0*  --   --   --   --   --   HGB 13.5 12.2* 13.7 12.7* 11.7* 11.9*  HCT 41.6 36.0* 42.0 38.5* 33.7* 35.5*  MCV 97.2  --  96.8 95.8 92.3 95.4  PLT 158  --  158 132* 144* 137*   Basic Metabolic  Panel: Recent Labs  Lab 12/11/22 0144 12/11/22 0337 12/11/22 0440 12/12/22 0547 12/13/22 1013 12/14/22 0351  NA 135 133* 131* 136 136 137  K 3.8 4.7 4.4 4.3 4.3 4.1  CL 104  --  100 107 108 112*  CO2 24  --  21* 20* 20* 19*  GLUCOSE 181*  --  127* 94 104* 95  BUN 22  --  20 23 28* 24*  CREATININE 1.73*  --  1.45* 1.53* 1.64* 1.62*  CALCIUM 8.6*  --  8.8* 8.4* 8.1* 8.0*   Liver Function Tests: Recent Labs  Lab 12/11/22 0144  AST 18  ALT 19  ALKPHOS 57  BILITOT 0.5  PROT 7.0  ALBUMIN 3.7   CBG: Recent Labs  Lab 12/13/22 0752 12/13/22 1206 12/13/22 1620 12/13/22 2012 12/14/22 0817  GLUCAP 115* 110* 123* 129* 93    Discharge time spent: greater than 30 minutes.  Signed: Delfino Lovett, MD [facility] 12/15/2022

## 2022-12-15 NOTE — Therapy (Deleted)
OUTPATIENT PHYSICAL THERAPY THORACOLUMBAR EVALUATION   Patient Name: Jon [name] MRN: [id number] DOB:[birth year], 77 y.o., male Today's Date: 12/15/2022    END OF SESSION:   Past Medical History:  Diagnosis Date   BENIGN POSITIONAL VERTIGO 05/20/2010   Qualifier: Diagnosis of  By: Linford Arnold MD, Ronnette Juniper emphysema [facility])    Cerebrovascular disease 07/15/2018   Prior CVA & TIA. Carotid CTA ~50% Bilateral ICA,  Multifocal severe stenosis of the left V2 vertebral artery.  Right vertebral artery patent without significant stenosis.   Coronary artery disease involving native coronary artery with angina pectoris (HCC) 11/18/2017   Severe LM, LAD & RCA stenosis indicated on Coronary CTA => CaCardiac Cath 01/02/2022: dLM ~50%; mLAD 85%@D1  90% (1,1,1) - m-dLAD 90$ @ 65% D2 (0,1,1), RI 75%, pLCx 30%-OM1 60%, pRCA 25% - p-mRCA hazy 85%. Normal LVEF by Echo, EDP 16 mmHg. = Best option CABGrdiac Cath    Current every day smoker    No interest in quitting   Diabetes mellitus, type II, insulin dependent (HCC)    With peripheral neuropathy, peripheral vascular disease and coronary artery disease as complications.   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 => 12/17/2021: L Subclavia A PTA=Stent with resolution of Sx.   TIA (transient ischemic attack) 11/11/2021   Brief episode of left sided arm pain and slurred speech   Past Surgical History:  Procedure Laterality Date   CORONARY/GRAFT ACUTE MI REVASCULARIZATION N/A 12/11/2022   Procedure: Coronary/Graft Acute MI Revascularization;  Surgeon: Marcina Millard, MD;  Location: [facility] INVASIVE CV LAB;  Service: Cardiovascular;  Laterality: N/A;   LEFT HEART CATH AND CORONARY ANGIOGRAPHY N/A 01/02/2022    Procedure: LEFT HEART CATH AND CORONARY ANGIOGRAPHY;  Surgeon: Marykay Lex, MD;  Location: [facility] INVASIVE CV LAB:: dLM ~50%; mLAD 85%@D1  90% (1,1,1) - m-dLAD 90$ @ 65% D2 (0,1,1), RI 75%, pLCx 30%-OM1 60%, pRCA 25% - p-mRCA hazy 85%. Normal LVEF by Echo, EDP 16 mmHg. = Best option CABG   LEFT HEART CATH AND CORONARY ANGIOGRAPHY N/A 12/11/2022   Procedure: LEFT HEART CATH AND CORONARY ANGIOGRAPHY;  Surgeon: Marcina Millard, MD;  Location: [facility] INVASIVE CV LAB;  Service: Cardiovascular;  Laterality: N/A;   PILONIDAL CYST EXCISION  1960, 61, 62, 63   TRANSTHORACIC ECHOCARDIOGRAM  11/28/2021   EF 55 to 60%.  No RWMA.  Moderate asymmetric LVH-basal septal.  GR 1 DD.  Normal RV.  Normal MV.  AOV sclerosis without stenosis.  Normal RAP.   UPPER EXTREMITY ANGIOGRAPHY Left 12/17/2021   Procedure: Upper Extremity Angiography;  Surgeon: Renford Dills, MD;  Location: [facility] INVASIVE CV LAB;  Service: Cardiovascular:: High-grade stenosis just distal to the origin of the L-Subclavian A, normal Innom A & L Carotid A => Ost-Prox L SubCl A 8x37x80 Stent - dilated with 9 mm balloon.   VASECTOMY     Zio Patch Monitor  11/2021   Predom Rhtyhm: SR - HR range 47-98 bpm, AVG 64 bpm.  3 runs of NS VT  (> 4 PVC beats): Fastest: 5 beats - HR 125-162 bpm, ~ 145 bpm, 2.1 sec; Longest with Fastest Avg-12 beats, 150-158 bpm, ~ 155 bpm, 4.6 sec.  1  5 beat Atrial Run: HR 125, Avg 120 bpm,   No  Sustained Arrhythmias,   Occasional isolated PACs (2.1%) & Rare isolated PVCs (<1%).  No patient triggered events.   Patient Active Problem List   Diagnosis Date Noted   Weakness generalized 12/12/2022   STEMI involving oth coronary artery of inferior wall (HCC) 12/11/2022   STEMI involving right coronary artery (HCC) 12/11/2022   Nightmares 08/04/2022   Senile purpura (HCC) 01/17/2022   Abnormal CT scan of heart    Carotid stenosis 12/01/2021   Precordial pain 11/16/2021   TIA (transient ischemic attack) 11/16/2021    Postural dizziness with near syncope 03/08/2020   Subclavian arterial stenosis (HCC) 12/07/2019   Abnormal TSH 12/07/2019   Insomnia 10/01/2018   Pulmonary nodule 07/15/2018   Cerebrovascular disease 07/15/2018   Continuous dependence on cigarette smoking 12/02/2017   History of stroke 12/02/2017   Shortness of breath on exertion 12/02/2017   Coronary artery disease involving native coronary artery with angina pectoris (HCC) 11/18/2017   Centrilobular emphysema (HCC) 11/03/2017   H/O parotidectomy 06/02/2017   Parotid mass 05/07/2017   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) 07/30/2016   CKD stage 3 due to type 2 diabetes mellitus (HCC) 03/22/2015   Screening for prostate cancer 07/22/2011   Diabetic peripheral neuropathy (HCC) 06/05/2011   ROTATOR CUFF SYNDROME, RIGHT 03/22/2010   History of cardiovascular disorder 02/28/2010   Diabetes mellitus, type II, insulin dependent (HCC) 01/31/2010   Hyperlipidemia due to type 2 diabetes mellitus (HCC) 01/31/2010   TOBACCO ABUSE 01/31/2010   Essential hypertension 01/31/2010    PCP: Nani Gasser, MD  REFERRING PROVIDER: Nani Gasser, MD  REFERRING DIAG: M54.50 (ICD-10-CM) - Acute bilateral low back pain, unspecified whether sciatica present  Rationale for Evaluation and Treatment: Rehabilitation  THERAPY DIAG:  No diagnosis found.  ONSET DATE: ***  SUBJECTIVE:                                                                                                                                                                                           SUBJECTIVE STATEMENT: ***  PERTINENT HISTORY:  ***  PAIN:  Are you having pain? {OPRCPAIN:27236}  PRECAUTIONS: {Therapy precautions:24002}  RED FLAGS: {PT Red Flags:29287}   WEIGHT BEARING RESTRICTIONS: {Yes ***/No:24003}  FALLS:  Has patient fallen in last 6 months? {fallsyesno:27318}  LIVING  ENVIRONMENT: Lives with: {OPRC lives with:25569::"lives with their family"} Lives in: {Lives in:25570} Stairs: {opstairs:27293} Has following equipment at home: {Assistive devices:23999}  OCCUPATION: ***  PLOF: {PLOF:24004}  PATIENT GOALS: ***  NEXT MD VISIT: ***  OBJECTIVE:   DIAGNOSTIC FINDINGS:  ***  PATIENT SURVEYS:  {rehab surveys:24030}  SCREENING FOR RED  FLAGS: Bowel or bladder incontinence: {Yes/No:304960894} Spinal tumors: {Yes/No:304960894} Cauda equina syndrome: {Yes/No:304960894} Compression fracture: {Yes/No:304960894} Abdominal aneurysm: {Yes/No:304960894}  COGNITION: Overall cognitive status: {cognition:24006}     SENSATION: {sensation:27233}  MUSCLE LENGTH: Hamstrings: Right *** deg; Left *** deg Thomas test: Right *** deg; Left *** deg  POSTURE: {posture:25561}  PALPATION: ***  LUMBAR ROM:   AROM eval  Flexion   Extension   Right lateral flexion   Left lateral flexion   Right rotation   Left rotation    (Blank rows = not tested)  LOWER EXTREMITY ROM:     {AROM/PROM:27142}  Right eval Left eval  Hip flexion    Hip extension    Hip abduction    Hip adduction    Hip internal rotation    Hip external rotation    Knee flexion    Knee extension    Ankle dorsiflexion    Ankle plantarflexion    Ankle inversion    Ankle eversion     (Blank rows = not tested)  LOWER EXTREMITY MMT:    MMT Right eval Left eval  Hip flexion    Hip extension    Hip abduction    Hip adduction    Hip internal rotation    Hip external rotation    Knee flexion    Knee extension    Ankle dorsiflexion    Ankle plantarflexion    Ankle inversion    Ankle eversion     (Blank rows = not tested)  LUMBAR SPECIAL TESTS:  {lumbar special test:25242}  FUNCTIONAL TESTS:  {Functional tests:24029}  GAIT: Distance walked: *** Assistive device utilized: {Assistive devices:23999} Level of assistance: {Levels of assistance:24026} Comments:  ***  TODAY'S TREATMENT:                                                                                                                              DATE: ***    PATIENT EDUCATION:  Education details: *** Person educated: {Person educated:25204} Education method: {Education Method:25205} Education comprehension: {Education Comprehension:25206}  HOME EXERCISE PROGRAM: ***  ASSESSMENT:  CLINICAL IMPRESSION: Patient is a *** y.o. *** who was seen today for physical therapy evaluation and treatment for ***.   OBJECTIVE IMPAIRMENTS: {opptimpairments:25111}.   ACTIVITY LIMITATIONS: {activitylimitations:27494}  PARTICIPATION LIMITATIONS: {participationrestrictions:25113}  PERSONAL FACTORS: {Personal factors:25162} are also affecting patient's functional outcome.   REHAB POTENTIAL: {rehabpotential:25112}  CLINICAL DECISION MAKING: {clinical decision making:25114}  EVALUATION COMPLEXITY: {Evaluation complexity:25115}   GOALS: Goals reviewed with patient? {yes/no:20286}  SHORT TERM GOALS: Target date: ***  *** Baseline: Goal status: INITIAL    LONG TERM GOALS: Target date: ***  *** Baseline:  Goal status: INITIAL  2.  *** Baseline:  Goal status: INITIAL  3.  *** Baseline:  Goal status: INITIAL  4.  *** Baseline:  Goal status: INITIAL  5.  *** Baseline:  Goal status: INITIAL  6.  *** Baseline:  Goal status: INITIAL   PLAN:  PT FREQUENCY: {rehab  frequency:25116}  PT DURATION: {rehab duration:25117}  PLANNED INTERVENTIONS: {rehab planned interventions:25118::"Therapeutic exercises","Therapeutic activity","Neuromuscular re-education","Balance training","Gait training","Patient/Family education","Self Care","Joint mobilization"}.  PLAN FOR NEXT SESSION: ***    Nolon Bussing, PT, DPT Physical Therapist - [place]  [facility]  12/15/22, 8:51 AM

## 2022-12-16 ENCOUNTER — Encounter: Payer: Self-pay | Admitting: *Deleted

## 2022-12-16 ENCOUNTER — Telehealth: Payer: Self-pay | Admitting: *Deleted

## 2022-12-16 NOTE — Transitions of Care (Post Inpatient/ED Visit) (Signed)
   12/16/2022  Name: Jon [name] MRN: [id number] DOB: 1946-04-26  Today's TOC FU Call Status: Today's TOC FU Call Status:: Unsuccessful Call (1st Attempt) Unsuccessful Call (1st Attempt) Date: 12/16/22  Attempted to reach the patient regarding the most recent Inpatient visit; left HIPAA compliant voice message requesting call back  Follow Up Plan: Additional outreach attempts will be made to reach the patient to complete the Transitions of Care (Post Inpatient visit) call.   Caryl Pina, RN, BSN, CCRN Alumnus RN CM Care Coordination/ Transition of Care- [facility] Care Management [phone]: direct office

## 2022-12-17 ENCOUNTER — Telehealth: Payer: Self-pay | Admitting: *Deleted

## 2022-12-17 ENCOUNTER — Encounter: Payer: Medicare Other | Admitting: Physical Therapy

## 2022-12-17 ENCOUNTER — Telehealth: Payer: Self-pay

## 2022-12-17 ENCOUNTER — Encounter: Payer: Self-pay | Admitting: *Deleted

## 2022-12-17 NOTE — Progress Notes (Signed)
   12/17/2022  Patient ID: Jon [name], male   DOB: Mar 10, 1946, 77 y.o.   MRN: [id number]  Patient outreach in response to message patient sent via MyChart requesting a telephone call.  -[name] was recently in the hospital for 6 days due to MI  -Bisoprolol, clopidogrel, and lisinopril were stopped -Started on metoprolol xl 25mg  daily and Brilinta 90mg  daily  Medication list has been updated to reflect these changes, and follow-up was already scheduled with the patient for 8/21 to address any additional needs.  Lenna Gilford, PharmD, DPLA

## 2022-12-17 NOTE — Transitions of Care (Post Inpatient/ED Visit) (Signed)
   12/17/2022  Name: Jon [name] MRN: [id number] DOB: 01/31/1946  Today's TOC FU Call Status: Today's TOC FU Call Status:: Unsuccessful Call (2nd Attempt) Unsuccessful Call (2nd Attempt) Date: 12/17/22  Attempted to reach the patient regarding the most recent Inpatient visit; left HIPAA compliant voice message requesting call back  Follow Up Plan: Additional outreach attempts will be made to reach the patient to complete the Transitions of Care (Post Inpatient visit) call.   Caryl Pina, RN, BSN, CCRN Alumnus RN CM Care Coordination/ Transition of Care- [facility] Care Management [phone]: direct office

## 2022-12-18 ENCOUNTER — Inpatient Hospital Stay: Payer: Medicare Other | Admitting: Family Medicine

## 2022-12-18 ENCOUNTER — Encounter: Payer: Self-pay | Admitting: *Deleted

## 2022-12-18 ENCOUNTER — Telehealth: Payer: Self-pay | Admitting: *Deleted

## 2022-12-18 NOTE — Transitions of Care (Post Inpatient/ED Visit) (Signed)
   12/18/2022  Name: Jon [name] MRN: [id number] DOB: May 12, 1946  Today's TOC FU Call Status: Today's TOC FU Call Status:: Unsuccessful Call (3rd Attempt) Unsuccessful Call (3rd Attempt) Date: 12/18/22  Attempted to reach the patient regarding the most recent Inpatient visit; left HIPAA compliant voice message requesting call back  Follow Up Plan: No further outreach attempts will be made at this time. We have been unable to contact the patient.  Caryl Pina, RN, BSN, CCRN Alumnus RN CM Care Coordination/ Transition of Care- [facility] Care Management [phone]: direct office

## 2022-12-22 ENCOUNTER — Emergency Department
Admission: EM | Admit: 2022-12-22 | Discharge: 2022-12-22 | Disposition: A | Discharge status: Home / Self Care | Payer: Medicare Other | Source: Home / Self Care | Attending: Emergency Medicine | Admitting: Emergency Medicine

## 2022-12-22 ENCOUNTER — Emergency Department: Payer: Medicare Other

## 2022-12-22 ENCOUNTER — Other Ambulatory Visit: Payer: Self-pay

## 2022-12-22 ENCOUNTER — Telehealth: Payer: Self-pay | Admitting: Cardiology

## 2022-12-22 ENCOUNTER — Ambulatory Visit: Payer: Medicare Other | Attending: General Practice | Admitting: General Practice

## 2022-12-22 ENCOUNTER — Encounter: Payer: Medicare Other | Admitting: Physical Therapy

## 2022-12-22 ENCOUNTER — Inpatient Hospital Stay: Payer: Medicare Other | Admitting: Family Medicine

## 2022-12-22 DIAGNOSIS — J439 Emphysema, unspecified: Secondary | ICD-10-CM | POA: Diagnosis not present

## 2022-12-22 DIAGNOSIS — E119 Type 2 diabetes mellitus without complications: Secondary | ICD-10-CM | POA: Insufficient documentation

## 2022-12-22 DIAGNOSIS — Z794 Long term (current) use of insulin: Secondary | ICD-10-CM | POA: Insufficient documentation

## 2022-12-22 DIAGNOSIS — I10 Essential (primary) hypertension: Secondary | ICD-10-CM | POA: Insufficient documentation

## 2022-12-22 DIAGNOSIS — I2510 Atherosclerotic heart disease of native coronary artery without angina pectoris: Secondary | ICD-10-CM | POA: Diagnosis not present

## 2022-12-22 DIAGNOSIS — J432 Centrilobular emphysema: Secondary | ICD-10-CM | POA: Diagnosis not present

## 2022-12-22 DIAGNOSIS — Z7982 Long term (current) use of aspirin: Secondary | ICD-10-CM | POA: Insufficient documentation

## 2022-12-22 DIAGNOSIS — R0602 Shortness of breath: Secondary | ICD-10-CM

## 2022-12-22 LAB — CBC WITH DIFFERENTIAL/PLATELET
Abs Immature Granulocytes: 0.02 10*3/uL (ref 0.00–0.07)
Basophils Absolute: 0 10*3/uL (ref 0.0–0.1)
Basophils Relative: 0 %
Eosinophils Absolute: 0.1 10*3/uL (ref 0.0–0.5)
Eosinophils Relative: 2 %
HCT: 44.2 % (ref 39.0–52.0)
Hemoglobin: 14.6 g/dL (ref 13.0–17.0)
Immature Granulocytes: 0 %
Lymphocytes Relative: 16 %
Lymphs Abs: 0.9 10*3/uL (ref 0.7–4.0)
MCH: 31.9 pg (ref 26.0–34.0)
MCHC: 33 g/dL (ref 30.0–36.0)
MCV: 96.7 fL (ref 80.0–100.0)
Monocytes Absolute: 0.4 10*3/uL (ref 0.1–1.0)
Monocytes Relative: 7 %
Neutro Abs: 4.5 10*3/uL (ref 1.7–7.7)
Neutrophils Relative %: 75 %
Platelets: 268 10*3/uL (ref 150–400)
RBC: 4.57 MIL/uL (ref 4.22–5.81)
RDW: 12.3 % (ref 11.5–15.5)
WBC: 5.9 10*3/uL (ref 4.0–10.5)
nRBC: 0 % (ref 0.0–0.2)

## 2022-12-22 LAB — COMPREHENSIVE METABOLIC PANEL
ALT: 22 U/L (ref 0–44)
AST: 24 U/L (ref 15–41)
Albumin: 4.1 g/dL (ref 3.5–5.0)
Alkaline Phosphatase: 82 U/L (ref 38–126)
Anion gap: 10 (ref 5–15)
BUN: 21 mg/dL (ref 8–23)
CO2: 22 mmol/L (ref 22–32)
Calcium: 8.9 mg/dL (ref 8.9–10.3)
Chloride: 103 mmol/L (ref 98–111)
Creatinine, Ser: 1.74 mg/dL — ABNORMAL HIGH (ref 0.61–1.24)
GFR, Estimated: 40 mL/min — ABNORMAL LOW (ref >60)
Glucose, Bld: 138 mg/dL — ABNORMAL HIGH (ref 70–99)
Potassium: 5.3 mmol/L — ABNORMAL HIGH (ref 3.5–5.1)
Sodium: 135 mmol/L (ref 135–145)
Total Bilirubin: 1.1 mg/dL (ref 0.3–1.2)
Total Protein: 8.1 g/dL (ref 6.5–8.1)

## 2022-12-22 LAB — BRAIN NATRIURETIC PEPTIDE: B Natriuretic Peptide: 206.6 pg/mL — ABNORMAL HIGH (ref 0.0–100.0)

## 2022-12-22 LAB — TROPONIN I (HIGH SENSITIVITY)
Troponin I (High Sensitivity): 138 ng/L (ref <18)
Troponin I (High Sensitivity): 116 ng/L (ref <18)

## 2022-12-22 MED ORDER — ALBUTEROL SULFATE HFA 108 (90 BASE) MCG/ACT IN AERS: 2.0000 | INHALATION_SPRAY | RESPIRATORY_TRACT | Status: DC | PRN

## 2022-12-22 NOTE — Telephone Encounter (Signed)
Called and spoke with pt/Son BP 135/78 does not know HR. No swelling, does not use oxygen, pt has DOE after taking 3-4 steps. I do not hear any SOB while talking to me. Reviewed with Edd Fabian, FNP-C

## 2022-12-22 NOTE — Discharge Instructions (Signed)
Continue taking all of your medications every day as prescribed.

## 2022-12-22 NOTE — ED Triage Notes (Addendum)
Pt arrives via POV w/ c/o shortness of breath since he left the hospital Sunday, was here for STEMI on 8/1. Pt had 1 stent placed. Pt also endorsing weakness. Pt was supposed to have followup appt with cardiologist today but was late so they wouldn't see him.

## 2022-12-22 NOTE — Telephone Encounter (Signed)
Patient came in with son  late for appointment. Let nurse know. Could not see him. Pt son said he was complaining about chest pain. Patient was upset and left.

## 2022-12-22 NOTE — Telephone Encounter (Signed)
Patient is calling because they had a heart attack on 08/01. Patient has an appt today with their PCP, but would like to see Dr. Herbie Baltimore today. Patient stated they are feeling weak and a lack of stability since their heart attack. Please advise.

## 2022-12-22 NOTE — Progress Notes (Deleted)
Cardiology Clinic Note   Patient Name: Jon [name] Date of Encounter: 12/22/2022  Primary Care Provider:  Agapito Games, MD Primary Cardiologist:  [name], MD  Patient Profile    Jon [name] 77 year old male presents to the clinic today for follow-up of his inferior STEMI.  Past Medical History    Past Medical History:  Diagnosis Date   BENIGN POSITIONAL VERTIGO 05/20/2010   Qualifier: Diagnosis of  By: Linford Arnold MD, Ronnette Juniper emphysema Jon [name])    Cerebrovascular disease 07/15/2018   Prior CVA & TIA. Carotid CTA ~50% Bilateral ICA,  Multifocal severe stenosis of the left V2 vertebral artery.  Right vertebral artery patent without significant stenosis.   Coronary artery disease involving native coronary artery with angina pectoris (HCC) 11/18/2017   Severe LM, LAD & RCA stenosis indicated on Coronary CTA => CaCardiac Cath 01/02/2022: dLM ~50%; mLAD 85%@D1  90% (1,1,1) - m-dLAD 90$ @ 65% D2 (0,1,1), RI 75%, pLCx 30%-OM1 60%, pRCA 25% - p-mRCA hazy 85%. Normal LVEF by Echo, EDP 16 mmHg. = Best option CABGrdiac Cath    Current every day smoker    No interest in quitting   Diabetes mellitus, type II, insulin dependent (HCC)    With peripheral neuropathy, peripheral vascular disease and coronary artery disease as complications.   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 => 12/17/2021: L Subclavia A PTA=Stent with resolution of Sx.   TIA (transient ischemic attack) 11/11/2021   Brief episode of left sided arm pain and slurred speech   Past Surgical History:  Procedure Laterality Date   CORONARY/GRAFT ACUTE MI REVASCULARIZATION N/A 12/11/2022   Procedure: Coronary/Graft Acute MI Revascularization;  Surgeon:  Marcina Millard, MD;  Location: [facility] INVASIVE CV LAB;  Service: Cardiovascular;  Laterality: N/A;   LEFT HEART CATH AND CORONARY ANGIOGRAPHY N/A 01/02/2022   Procedure: LEFT HEART CATH AND CORONARY ANGIOGRAPHY;  Surgeon: Marykay Lex, MD;  Location: [facility] INVASIVE CV LAB:: dLM ~50%; mLAD 85%@D1  90% (1,1,1) - m-dLAD 90$ @ 65% D2 (0,1,1), RI 75%, pLCx 30%-OM1 60%, pRCA 25% - p-mRCA hazy 85%. Normal LVEF by Echo, EDP 16 mmHg. = Best option CABG   LEFT HEART CATH AND CORONARY ANGIOGRAPHY N/A 12/11/2022   Procedure: LEFT HEART CATH AND CORONARY ANGIOGRAPHY;  Surgeon: Marcina Millard, MD;  Location: [facility] INVASIVE CV LAB;  Service: Cardiovascular;  Laterality: N/A;   PILONIDAL CYST EXCISION  1960, 61, 62, 63   TRANSTHORACIC ECHOCARDIOGRAM  11/28/2021   EF 55 to 60%.  No RWMA.  Moderate asymmetric LVH-basal septal.  GR 1 DD.  Normal RV.  Normal MV.  AOV sclerosis without stenosis.  Normal RAP.   UPPER EXTREMITY ANGIOGRAPHY Left 12/17/2021   Procedure: Upper Extremity Angiography;  Surgeon: Renford Dills, MD;  Location: [facility] INVASIVE CV LAB;  Service: Cardiovascular:: High-grade stenosis just distal to the origin of the L-Subclavian A, normal Innom A & L Carotid A => Ost-Prox L SubCl A 8x37x80 Stent - dilated with 9 mm balloon.   VASECTOMY     Zio Patch Monitor  11/2021   Predom Rhtyhm: SR - HR range 47-98 bpm, AVG 64 bpm.  3 runs of NS VT  (> 4 PVC beats): Fastest: 5 beats - HR 125-162 bpm, ~  145 bpm, 2.1 sec; Longest with Fastest Avg-12 beats, 150-158 bpm, ~ 155 bpm, 4.6 sec.  1  5 beat Atrial Run: HR 125, Avg 120 bpm,   No Sustained Arrhythmias,   Occasional isolated PACs (2.1%) & Rare isolated PVCs (<1%).  No patient triggered events.    Allergies  Allergies  Allergen Reactions   Gabapentin Other (See Comments)    Nightmares.    Lisinopril Other (See Comments)    Hypotension   Metformin And Related Diarrhea    History of Present Illness    Jon [name] has a PMH of  coronary artery disease, hypertension, diabetes, 50+ year smoking history, and weakness.  He was admitted to the hospital 12/11/2022 and discharged on 12/14/2022.  He was diagnosed with inferior STEMI.  He underwent cardiac catheterization and received PCI with DES to his proximal RCA.  He was noted to have mid LAD stenosis 75% and OM1 stenosis 75%.  He was placed on aspirin and Brilinta.  His EF was noted to be 50-55%.  Outpatient follow-up with CT surgery for CABG consideration was recommended.  His fatigue somewhat resolved after holding lisinopril.  He was discharged in stable condition on 12/14/2022.  He contacted the nurse triage line today and was added to my schedule.  He presents to the clinic today for follow-up evaluation and states***.  *** denies chest pain, shortness of breath, lower extremity edema, fatigue, palpitations, melena, hematuria, hemoptysis, diaphoresis, weakness, presyncope, syncope, orthopnea, and PND.  Inferior STEMI-no chest pain today.  Underwent cardiac catheterization with PCI and DES to his proximal RCA.  He was also noted to have mid LAD stenosis 75% and OM1 stenosis 75%.  Follow-up with CT surgery was recommended.  EF was noted to be 50-55%. Continue aspirin, Brilinta, atorvastatin Heart healthy low-sodium diet Ambulatory referral to CT surgery  Hyperlipidemia-LDL***. High-fiber diet Continue aspirin, atorvastatin  Hypotension-BP today***.  Lisinopril held during hospitalization due to low blood pressure. Continue metoprolol Heart healthy low-sodium diet Maintain blood pressure log  Disposition: Follow-up with Dr. Herbie Baltimore or me in 3-4 months.    Home Medications    Prior to Admission medications   Medication Sig Start Date End Date Taking? Authorizing Provider  Accu-Chek Softclix Lancets lancets Use to check blood sugars twice daily 01/01/22   Agapito Games, MD  atorvastatin (LIPITOR) 80 MG tablet TAKE 1 TABLET BY MOUTH AT  BEDTIME 09/23/22    Agapito Games, MD  Blood Glucose Monitoring Suppl (ACCU-CHEK GUIDE ME) w/Device KIT Check glucose three times daily. Dx. Code E11.8 01/01/22   Agapito Games, MD  glucose blood (ACCU-CHEK GUIDE) test strip Dx DM E11.9. Check fasting blood sugar every morning. 01/01/22   Agapito Games, MD  insulin degludec (TRESIBA FLEXTOUCH) 100 UNIT/ML FlexTouch Pen Inject 30-35 Units into the skin daily. Patient taking differently: Inject 30 Units into the skin at bedtime. 12/04/22   Agapito Games, MD  metoprolol succinate (TOPROL-XL) 25 MG 24 hr tablet Take 25 mg by mouth daily.    [provider]  Semaglutide,0.25 or 0.5MG /DOS, 2 MG/3ML SOPN Inject 0.5 mg into the skin every 7 (seven) days. 12/04/22   Agapito Games, MD  SYNJARDY 12.5-500 MG TABS TAKE 1 TABLET BY MOUTH  TWICE DAILY 09/03/21   Agapito Games, MD  ticagrelor (BRILINTA) 90 MG TABS tablet Take 1 tablet (90 mg total) by mouth 2 (two) times daily. 12/12/22   Delfino Lovett, MD  traZODone (DESYREL) 50 MG tablet Take 1.5 tablets (  75 mg total) by mouth at bedtime. for sleep Patient taking differently: Take 50 mg by mouth at bedtime. for sleep 08/04/22   Agapito Games, MD    Family History    Family History  Problem Relation Age of Onset   Diabetes Other        family hx of   Colon cancer Neg Hx    He indicated that the status of his neg hx is unknown. He indicated that the status of his other is unknown.  Social History    Social History   Socioeconomic History   Marital status: Divorced    Spouse name: Not on file   Number of children: 2   Years of education: 12th grade   Highest education level: High school graduate  Occupational History   Occupation: Retired  Tobacco Use   Smoking status: Former    Current packs/day: 0.00    Average packs/day: 1 pack/day for 62.0 years (62.0 ttl pk-yrs)    Types: Cigarettes    Start date: 01/13/1960    Quit date: 01/12/2022    Years since  quitting: 0.9   Smokeless tobacco: Never  Vaping Use   Vaping status: Never Used  Substance and Sexual Activity   Alcohol use: No   Drug use: No   Sexual activity: Not on file  Other Topics Concern   Not on file  Social History Narrative   Lives alone and Likes to play golf 2 x 3 times a week.   Social Determinants of Health   Financial Resource Strain: Low Risk  (08/01/2022)   Overall Financial Resource Strain (CARDIA)    Difficulty of Paying Living Expenses: Not hard at all  Food Insecurity: No Food Insecurity (08/01/2022)   Hunger Vital Sign    Worried About Running Out of Food in the Last Year: Never true    Ran Out of Food in the Last Year: Never true  Transportation Needs: No Transportation Needs (08/06/2022)   PRAPARE - Administrator, Civil Service (Medical): No    Lack of Transportation (Non-Medical): No  Physical Activity: Inactive (08/01/2022)   Exercise Vital Sign    Days of Exercise per Week: 0 days    Minutes of Exercise per Session: 0 min  Stress: No Stress Concern Present (08/01/2022)   [employer] of Occupational Health - Occupational Stress Questionnaire    Feeling of Stress : Not at all  Social Connections: Socially Isolated (08/01/2022)   Social Connection and Isolation Panel [NHANES]    Frequency of Communication with Friends and Family: Three times a week    Frequency of Social Gatherings with Friends and Family: Once a week    Attends Religious Services: Never    Database administrator or Organizations: No    Attends Banker Meetings: Never    Marital Status: Divorced  Catering manager Violence: Not At Risk (08/01/2022)   Humiliation, Afraid, Rape, and Kick questionnaire    Fear of Current or Ex-Partner: No    Emotionally Abused: No    Physically Abused: No    Sexually Abused: No     Review of Systems    General:  No chills, fever, night sweats or weight changes.  Cardiovascular:  No chest pain, dyspnea on exertion,  edema, orthopnea, palpitations, paroxysmal nocturnal dyspnea. Dermatological: No rash, lesions/masses Respiratory: No cough, dyspnea Urologic: No hematuria, dysuria Abdominal:   No nausea, vomiting, diarrhea, bright red blood per rectum, melena, or hematemesis Neurologic:  No  visual changes, wkns, changes in mental status. All other systems reviewed and are otherwise negative except as noted above.  Physical Exam    VS:  There were no vitals taken for this visit. , BMI There is no height or weight on file to calculate BMI. GEN: Well nourished, well developed, in no acute distress. HEENT: normal. Neck: Supple, no JVD, carotid bruits, or masses. Cardiac: RRR, no murmurs, rubs, or gallops. No clubbing, cyanosis, edema.  Radials/DP/PT 2+ and equal bilaterally.  Respiratory:  Respirations regular and unlabored, clear to auscultation bilaterally. GI: Soft, nontender, nondistended, BS + x 4. MS: no deformity or atrophy. Skin: warm and dry, no rash. Neuro:  Strength and sensation are intact. Psych: Normal affect.  Accessory Clinical Findings    Recent Labs: 12/11/2022: ALT 19 12/14/2022: BUN 24; Creatinine, Ser 1.62; Hemoglobin 11.9; Platelets 137; Potassium 4.1; Sodium 137   Recent Lipid Panel    Component Value Date/Time   CHOL 109 12/11/2022 0144   CHOL 105 02/12/2022 0941   TRIG 103 12/11/2022 0144   HDL 37 (L) 12/11/2022 0144   HDL 36 (L) 02/12/2022 0941   CHOLHDL 2.9 12/11/2022 0144   VLDL 21 12/11/2022 0144   LDLCALC 51 12/11/2022 0144   LDLCALC 46 02/12/2022 0941   LDLCALC 78 11/22/2020 1420    No BP recorded.  {Refresh Note OR Click here to enter BP  :1}***    ECG personally reviewed by me today- ***     Echocardiogram 12/11/2022 IMPRESSIONS     1. Left ventricular ejection fraction, by estimation, is 50 to 55%. The  left ventricle has low normal function. The left ventricle demonstrates  regional wall motion abnormalities (see scoring diagram/findings for   description). Left ventricular diastolic   parameters are consistent with Grade I diastolic dysfunction (impaired  relaxation).   2. Right ventricular systolic function is normal. The right ventricular  size is normal.   3. The mitral valve is normal in structure. Mild mitral valve  regurgitation. No evidence of mitral stenosis.   4. The aortic valve is normal in structure. Aortic valve regurgitation is  not visualized. No aortic stenosis is present.   5. The inferior vena cava is normal in size with greater than 50%  respiratory variability, suggesting right atrial pressure of 3 mmHg.   FINDINGS   Left Ventricle: Left ventricular ejection fraction, by estimation, is 50  to 55%. The left ventricle has low normal function. The left ventricle  demonstrates regional wall motion abnormalities. The left ventricular  internal cavity size was normal in  size. There is no left ventricular hypertrophy. Left ventricular diastolic  parameters are consistent with Grade I diastolic dysfunction (impaired  relaxation).     LV Wall Scoring:  The mid and distal inferior wall is hypokinetic.   Right Ventricle: The right ventricular size is normal. No increase in  right ventricular wall thickness. Right ventricular systolic function is  normal.   Left Atrium: Left atrial size was normal in size.   Right Atrium: Right atrial size was normal in size.   Pericardium: There is no evidence of pericardial effusion.   Mitral Valve: The mitral valve is normal in structure. Mild mitral valve  regurgitation. No evidence of mitral valve stenosis. MV peak gradient, 5.3  mmHg. The mean mitral valve gradient is 1.0 mmHg.   Tricuspid Valve: The tricuspid valve is normal in structure. Tricuspid  valve regurgitation is mild . No evidence of tricuspid stenosis.   Aortic Valve: The aortic  valve is normal in structure. Aortic valve  regurgitation is not visualized. No aortic stenosis is present. Aortic  valve  mean gradient measures 2.0 mmHg. Aortic valve peak gradient measures  4.1 mmHg. Aortic valve area, by VTI  measures 2.96 cm.   Pulmonic Valve: The pulmonic valve was normal in structure. Pulmonic valve  regurgitation is not visualized. No evidence of pulmonic stenosis.   Aorta: The aortic root is normal in size and structure.   Venous: The inferior vena cava is normal in size with greater than 50%  respiratory variability, suggesting right atrial pressure of 3 mmHg.   IAS/Shunts: No atrial level shunt detected by color flow Doppler.    Cardiac catheterization 12/11/2022    1st Mrg lesion is 75% stenosed.   Prox LAD lesion is 70% stenosed.   Mid LAD lesion is 75% stenosed.   Mid LM to Dist LM lesion is 75% stenosed.   Prox RCA lesion is 100% stenosed.   A drug-eluting stent was successfully placed using a STENT ONYX FRONTIER 2.5X22.   Post intervention, there is a 0% residual stenosis.   There is mild left ventricular systolic dysfunction.   LV end diastolic pressure is mildly elevated.   The left ventricular ejection fraction is 45-50% by visual estimate.   1.  Inferior STEMI 2.  Three-vessel coronary artery disease with 75% stenosis distal left main, 70% stenosis proximal LAD, 75% stenosis mid LAD, 95% stenosis OM1, 100% stenosis proximal RCA (culprit vessel) 3.  Mildly reduced left ventricular function with estimated LV ejection fraction 45 to 50% 4.  Successful primary PCI with 2.5 x 22 mm Onyx frontier drug-eluting stent proximal RCA   Recommendations   1.  Dual antiplatelet therapy uninterrupted x 1 year 2.  Start metoprolol succinate 25 mg daily 3.  2D echocardiogram 4.  Start ACE/ARB prior to discharge 5.  Cardiac rehabilitation after discharge 6.  Close cardiology follow-up after discharge  Diagnostic Dominance: Right  Intervention       Assessment & Plan   1.  ***   Thomasene Ripple.  NP-C     12/22/2022, 9:49 AM [name]  [facility] [address] Office [phone] Fax [phone]    I spent***minutes examining this patient, reviewing medications, and using patient centered shared decision making involving her cardiac care.  Prior to her visit I spent greater than 20 minutes reviewing her past medical history,  medications, and prior cardiac tests.

## 2022-12-22 NOTE — Telephone Encounter (Signed)
Returned pt call. Pt is weak, sob and unstable since having a heart attack on 12/11/22. No complaints of chest pain or pressure. No dizziness or lightheadedness. Pt is concerned if this is normal after a heart attack. Scheduled with Tawni Levy today.

## 2022-12-22 NOTE — Telephone Encounter (Signed)
See other message:  Called and spoke with pt/Son BP 135/78 does not know HR. No swelling, does not use oxygen, pt has DOE after taking 3-4 steps. I do not hear any SOB while talking to me. Reviewed with Edd Fabian, FNP-C

## 2022-12-22 NOTE — ED Provider Notes (Signed)
[facility] Provider Note    Event Date/Time   First MD Initiated Contact with Patient 12/22/22 1537     (approximate)   History   Chief Complaint: Shortness of Breath and Weakness   HPI  Jon [name] is a 77 y.o. male with a history of diabetes, hypertension, emphysema, CAD post STEMI 11 days ago who comes to the ED due to dyspnea on exertion that is been present since his recent heart attack.  No change in symptoms, not worse, not better.  Denies any chest pain.  No dizziness or syncope or palpitations.  Reports that he was advised he needed a CABG in the past prior to this heart attack but is reluctant.  Reports compliance with his medications including aspirin and Brilinta.     Physical Exam   Triage Vital Signs: ED Triage Vitals  Encounter Vitals Group     BP 12/22/22 1403 (!) 137/118     Systolic BP Percentile --      Diastolic BP Percentile --      Pulse Rate 12/22/22 1403 80     Resp 12/22/22 1403 20     Temp 12/22/22 1403 97.9 F (36.6 C)     Temp Source 12/22/22 1403 Oral     SpO2 12/22/22 1403 98 %     Weight 12/22/22 1404 207 lb (93.9 kg)     Height 12/22/22 1404 6\' 1"  (1.854 m)     Head Circumference --      Peak Flow --      Pain Score 12/22/22 1404 0     Pain Loc --      Pain Education --      Exclude from Growth Chart --     Most recent vital signs: Vitals:   12/22/22 1535 12/22/22 1540  BP: (!) 140/85 128/77  Pulse: 73 75  Resp: 10 20  Temp:    SpO2: 100% 100%    General: Awake, no distress.  CV:  Good peripheral perfusion.  Regular rate rhythm, no murmurs Resp:  Normal effort.  Clear to auscultation bilaterally Abd:  No distention.  Soft nontender Other:  No lower extremity edema   ED Results / Procedures / Treatments   Labs (all labs ordered are listed, but only abnormal results are displayed) Labs Reviewed  COMPREHENSIVE METABOLIC PANEL - Abnormal; Notable for the following components:      Result  Value   Potassium 5.3 (*)    Glucose, Bld 138 (*)    Creatinine, Ser 1.74 (*)    GFR, Estimated 40 (*)    All other components within normal limits  BRAIN NATRIURETIC PEPTIDE - Abnormal; Notable for the following components:   B Natriuretic Peptide 206.6 (*)    All other components within normal limits  TROPONIN I (HIGH SENSITIVITY) - Abnormal; Notable for the following components:   Troponin I (High Sensitivity) 138 (*)    All other components within normal limits  TROPONIN I (HIGH SENSITIVITY) - Abnormal; Notable for the following components:   Troponin I (High Sensitivity) 116 (*)    All other components within normal limits  CBC WITH DIFFERENTIAL/PLATELET     EKG Interpreted by me Sinus rhythm, rate of 78.  Left axis, poor R wave progression.  Slight ST elevation in inferior and anterolateral leads without reciprocal changes.  Unchanged compared to prior EKG post stent on December 11, 2022.  Prior EKG demonstrating STEMI shows profound ST elevation in similar distribution.  RADIOLOGY Chest x-ray interpreted by me, appears normal.  Radiology report reviewed   PROCEDURES:  Procedures   MEDICATIONS ORDERED IN ED: Medications  albuterol (VENTOLIN HFA) 108 (90 Base) MCG/ACT inhaler 2 puff (has no administration in time range)     IMPRESSION / MDM / ASSESSMENT AND PLAN / ED COURSE  I reviewed the triage vital signs and the nursing notes.  DDx: COPD, pneumonia, pneumothorax, NSTEMI, stable angina pectoris  Patient's presentation is most consistent with acute presentation with potential threat to life or bodily function.  Patient presents with dyspnea on exertion, subacute to chronic.  No recent change in symptomatology.  Doubt stent thrombosis or new arterial occlusion, doubt pericardial effusion or PE or aortic dissection.  EKG unchanged, chest x-ray and exam unremarkable.  Will trend troponin.   Clinical Course as of 12/22/22 1815  Mon Dec 22, 2022  1625 Trop 138.  Likely downtrending after recent STEMI (tn then was >24k). Will repeat today. Pt not able to wait for repeat result due to pre-existing family plans. Will call with result if significantly uptrending. Low suspicion for ACS, pericardial effusion, PE, dissection. [PS]    Clinical Course User Index [PS] Sharman Cheek, MD    ----------------------------------------- 6:14 PM on 12/22/2022 ----------------------------------------- Repeat troponin continues downtrending consistent with recent STEMI but no active ACS.  Stable for continued outpatient follow-up.   FINAL CLINICAL IMPRESSION(S) / ED DIAGNOSES   Final diagnoses:  Shortness of breath  Centrilobular emphysema (HCC)  Type 2 diabetes mellitus without complication, with long-term current use of insulin (HCC)     Rx / DC Orders   ED Discharge Orders          Ordered    Ambulatory referral to Cardiology       Comments: If you have not heard from the Cardiology office within the next 72 hours please call [phone].   12/22/22 1627             Note:  This document was prepared using Dragon voice recognition software and may include unintentional dictation errors.   Sharman Cheek, MD 12/22/22 [phone]

## 2022-12-22 NOTE — ED Notes (Signed)
Pt reports feeling weak.  Pt had MI 12/11/22 and was treated here at [facility].  Intermitttent sob.  No chest pain.  No n/v/d  pt alert speech clear.  Family with pt.

## 2022-12-23 ENCOUNTER — Encounter: Payer: Self-pay | Admitting: Family Medicine

## 2022-12-23 ENCOUNTER — Ambulatory Visit (INDEPENDENT_AMBULATORY_CARE_PROVIDER_SITE_OTHER): Payer: Medicare Other | Admitting: Family Medicine

## 2022-12-23 VITALS — BP 125/82 | HR 79 | Ht 74.0 in | Wt 209.0 lb

## 2022-12-23 DIAGNOSIS — D692 Other nonthrombocytopenic purpura: Secondary | ICD-10-CM | POA: Diagnosis not present

## 2022-12-23 DIAGNOSIS — E119 Type 2 diabetes mellitus without complications: Secondary | ICD-10-CM | POA: Diagnosis not present

## 2022-12-23 DIAGNOSIS — I213 ST elevation (STEMI) myocardial infarction of unspecified site: Secondary | ICD-10-CM | POA: Diagnosis not present

## 2022-12-23 DIAGNOSIS — N183 Chronic kidney disease, stage 3 unspecified: Secondary | ICD-10-CM | POA: Diagnosis not present

## 2022-12-23 DIAGNOSIS — R0602 Shortness of breath: Secondary | ICD-10-CM | POA: Diagnosis not present

## 2022-12-23 DIAGNOSIS — E1122 Type 2 diabetes mellitus with diabetic chronic kidney disease: Secondary | ICD-10-CM

## 2022-12-23 DIAGNOSIS — Z794 Long term (current) use of insulin: Secondary | ICD-10-CM | POA: Diagnosis not present

## 2022-12-23 MED ORDER — TRESIBA FLEXTOUCH 100 UNIT/ML ~~LOC~~ SOPN: 20.0000 [IU] | PEN_INJECTOR | Freq: Every day | SUBCUTANEOUS | 0 refills | Status: AC

## 2022-12-23 MED ORDER — SYNJARDY 12.5-500 MG PO TABS: 1.0000 | ORAL_TABLET | Freq: Two times a day (BID) | ORAL | 0 refills | Status: DC

## 2022-12-23 NOTE — Patient Instructions (Addendum)
OK to decrease you daily insulin tresiba to 20 units daily. If sugars are going under 100 on the 20 units then ok to decrease the Tresiba to 10 units daily.   Keep up with the once weekly shot as well  Go ahead and restart the lisinopril 2.5 mg once daily.  Continue your 2 blood thinners.    Did go ahead and order your cardiac rehab.

## 2022-12-23 NOTE — Assessment & Plan Note (Signed)
Pressures are stable and well-controlled so I feel like or safe to restart the lisinopril.  Go ahead and restart the lisinopril 2.5 mg once daily.

## 2022-12-23 NOTE — Assessment & Plan Note (Signed)
OK to decrease you daily insulin tresiba to 20 units daily. If sugars are going under 100 on the 20 units then ok to decrease the Tresiba to 10 units daily.   Keep up with the once weekly shot as well

## 2022-12-23 NOTE — Assessment & Plan Note (Signed)
Extensive bruising on forearms from blooddraws, IVs, etc.

## 2022-12-23 NOTE — Progress Notes (Signed)
Established Patient Office Visit  Subjective   Patient ID: Jon [name], male    DOB: 03/20/1946  Age: 77 y.o. MRN: [id number]  Chief Complaint  Patient presents with   Hospitalization Follow-up    HPI Here today for hospital follow-up.  He was admitted on August 1 and discharged on August 4.  He was diagnosed with an inferior STEMI.  They have actually recommended CABG.  Still to take aspirin, Brilinta and they adjusted his statin.  On his metoprolol and lisinopril because of low blood pressure.  He did undergo cardiac catheterization while there.  He actually ended up going back to the emergency department yesterday for shortness of breath and weakness.  Has noticed blood sugars are dropping since starting the semaglutide weekly.  In fact yesterday he had a glucose of 70 and felt shaky and off.  Seeing ENT tomorrow.  Voice has been haorse. No pain in the throat.   Says yesterday he had a low blood sugar he had already recently decreased his [place] down to 30 units.  He is on the semaglutide and tolerating it well so far.    ROS    Objective:     BP 125/82 (BP Location: Right Arm, Patient Position: Sitting, Cuff Size: Normal)   Pulse 79   Ht 6\' 2"  (1.88 m)   Wt 209 lb (94.8 kg)   SpO2 99%   BMI 26.83 kg/m    Vitals:   12/23/22 1413 12/23/22 1446 12/23/22 1447  BP: (!) 146/79 132/81 125/82  Pulse: 77 77 79  Height: 6\' 2"  (1.88 m)    Weight: 209 lb (94.8 kg)    SpO2: 99%    BMI (Calculated): 26.82       Physical Exam Constitutional:      Appearance: He is well-developed.  HENT:     Head: Normocephalic and atraumatic.  Cardiovascular:     Rate and Rhythm: Normal rate and regular rhythm.     Heart sounds: Normal heart sounds.  Pulmonary:     Effort: Pulmonary effort is normal.     Breath sounds: Normal breath sounds.  Skin:    General: Skin is warm and dry.  Neurological:     Mental Status: He is alert and oriented to person, place, and time.   Psychiatric:        Behavior: Behavior normal.      No results found for any visits on 12/23/22.    The ASCVD Risk score (Arnett DK, et al., 2019) failed to calculate for the following reasons:   The patient has a prior MI or stroke diagnosis    Assessment & Plan:   Problem List Items Addressed This Visit       Cardiovascular and Mediastinum   Senile purpura (HCC) (Chronic)    Extensive bruising on forearms from blooddraws, IVs, etc.       Relevant Medications   aspirin EC 81 MG tablet   lisinopril (ZESTRIL) 2.5 MG tablet     Endocrine   CKD stage 3 due to type 2 diabetes mellitus (HCC) (Chronic)    Pressures are stable and well-controlled so I feel like or safe to restart the lisinopril.  Go ahead and restart the lisinopril 2.5 mg once daily.      Relevant Medications   insulin degludec (TRESIBA FLEXTOUCH) 100 UNIT/ML FlexTouch Pen   aspirin EC 81 MG tablet   lisinopril (ZESTRIL) 2.5 MG tablet   Empagliflozin-metFORMIN HCl (SYNJARDY) 12.5-500 MG TABS   Diabetes  mellitus, type II, insulin dependent (HCC)    OK to decrease you daily insulin tresiba to 20 units daily. If sugars are going under 100 on the 20 units then ok to decrease the Tresiba to 10 units daily.   Keep up with the once weekly shot as well      Relevant Medications   insulin degludec (TRESIBA FLEXTOUCH) 100 UNIT/ML FlexTouch Pen   aspirin EC 81 MG tablet   lisinopril (ZESTRIL) 2.5 MG tablet   Empagliflozin-metFORMIN HCl (SYNJARDY) 12.5-500 MG TABS   Other Visit Diagnoses     ST elevation myocardial infarction (STEMI), unspecified artery (HCC)    -  Primary   Relevant Medications   aspirin EC 81 MG tablet   lisinopril (ZESTRIL) 2.5 MG tablet   Other Relevant Orders   Amb Referral to Cardiac Rehabilitation   SOB (shortness of breath)          Shortness of breath-reassured him that I think this is from his heart attack any physical exertion is making him feel tired and short of breath.  We  did do a short walk test just to make sure that he was maintaining his oxygen which she was which is very reassuring.  I think he is gena be a great candidate for cardiac rehab to start about another 2 to 3 weeks and get a go ahead and place an order so that they can go ahead and get him scheduled out.  Just encouraged him to make sure that when he gets home he really is taking the aspirin daily and the Brilinta twice a day.  There is a bit of confusion on medications today so we went through and updated his list and tried to clarify which ones are in the morning and which ones are in the evening.  Return in about 4 weeks (around 01/20/2023) for BP.    Nani Gasser, MD

## 2022-12-24 ENCOUNTER — Encounter: Payer: Medicare Other | Admitting: Physical Therapy

## 2022-12-24 DIAGNOSIS — J383 Other diseases of vocal cords: Secondary | ICD-10-CM | POA: Diagnosis not present

## 2022-12-24 DIAGNOSIS — R490 Dysphonia: Secondary | ICD-10-CM | POA: Diagnosis not present

## 2022-12-24 NOTE — Progress Notes (Deleted)
Cardiology Office Note:  .   Date:  12/24/2022  ID:  Jon [name], DOB 1945/07/02, MRN [id number] PCP: Agapito Games, MD  [facility] Providers Cardiologist:  [name], MD { Click to update primary MD,subspecialty MD or APP then REFRESH:1}   History of Present Illness: [name]   Jon [name] is a 77 y.o. male with a past medical history of hyperlipidemia, TIA, hypertension, postural dizziness, subclavian artery stenosis, CKD stage III, and coronary artery disease, who presents today for hospital follow-up.  He was seen in 11/14/2021 for DOE and atypical chest pain, TIA, and left arm numbness.  Cardiac CTA, heart monitor, echo with bubble study, carotid below subclavian CTA were ordered.  CT of the neck was ordered for subclavian artery stenosis.  Cardiac CTA showed coronary calcium score of 562, 67 percentile, greater than 70% stenosis in the PDA RCA and M LAD, cardiac catheterization was recommended.  CT of the neck showed multifocal severe stenosis of the V2 vertebral artery, severe stenosis of the proximal left subclavian artery, bilateral carotid bifurcation ICA atherosclerosis without greater than 50% stenosis.  He was last seen in clinic 01/16/2022 by Dr. Herbie Baltimore.  Patient was noted to have multivessel CAD.  It was recommended he have a referral to CVTS to discuss the risk and benefits and alternatives and indication for CABG.  After long discussion he was agreeable to meet with the surgeons but was still fixated on medical therapy.  He presented to the [facility] emergency on 12/11/2022 and presented as an inferior STEMI.  Patient been in his usual state of health until midnight when he experienced shortness of breath and left-sided chest discomfort.  When he presented to the emergency department ST elevations were noted in lead II, III, aVF, V5 and V6 consistent with inferior ST elevated myocardial infarction.  He underwent emergent cardiac catheterization which revealed  occluded proximal RCA, 75% stenosis to the distal left main, 70% stenosis of the proximal LAD, 75% stenosis mid LAD, 75% stenosis to the OM1.  Left ventriculogram revealed a mildly reduced left ventricular function with an LVEF of 45-50%.  Patient underwent primary PCI and successful DES placement to the proximal RCA with a resolution of his chest discomfort.  Metoprolol and lisinopril had to be discontinued due to hypotension and fatigue during his hospitalization.  Would consider restarting as blood pressure permits on the outpatient basis.  After much discussion it was decided that he would follow-up with CVTS for consideration of CABG as an outpatient.  He was considered stable and discharged from the facility on 12/14/22.  Patient presented to the [facility] emergency department on 12/22/2022 with complaints of shortness of breath and weakness.  Denied any chest pain.  Blood pressure was 137/118, pulse of 80, respirations of 20, temperature 97.9.  Labs revealed potassium 5.3, blood glucose 138, serum creatinine 1.74, GFR 40, BNP 206.6, high-sensitivity troponin 138 and 116.  EKG revealed sinus rhythm rate of 78, left axis deviation with poor R wave progression.  Slight ST elevation in inferior and anterior lateral leads without reciprocal changes.  Unchanged compared to prior EKG [name] STEMI.  It was decided to troponin would be trended but patient had pre-existing family plans and was not willing to wait in the emergency department until the labs came back he was advised by the ER if it was an uptrend he would be called to return and he was agreeable and was discharged from the emergency department with outpatient follow-up.  He returns  to clinic today  ROS: 10 point review of systems has been reviewed and considered negative with exception of what is been listed in HPI  Studies Reviewed: [name]        TTE 12/11/22 1. Left ventricular ejection fraction, by estimation, is 50 to 55%. The  left ventricle has low  normal function. The left ventricle demonstrates  regional wall motion abnormalities (see scoring diagram/findings for  description). Left ventricular diastolic   parameters are consistent with Grade I diastolic dysfunction (impaired  relaxation).   2. Right ventricular systolic function is normal. The right ventricular  size is normal.   3. The mitral valve is normal in structure. Mild mitral valve  regurgitation. No evidence of mitral stenosis.   4. The aortic valve is normal in structure. Aortic valve regurgitation is  not visualized. No aortic stenosis is present.   5. The inferior vena cava is normal in size with greater than 50%  respiratory variability, suggesting right atrial pressure of 3 mmHg.    LHC 12/11/22   1st Mrg lesion is 75% stenosed.   Prox LAD lesion is 70% stenosed.   Mid LAD lesion is 75% stenosed.   Mid LM to Dist LM lesion is 75% stenosed.   Prox RCA lesion is 100% stenosed.   A drug-eluting stent was successfully placed using a STENT ONYX FRONTIER 2.5X22.   [name] intervention, there is a 0% residual stenosis.   There is mild left ventricular systolic dysfunction.   LV end diastolic pressure is mildly elevated.   The left ventricular ejection fraction is 45-50% by visual estimate.   1.  Inferior STEMI 2.  Three-vessel coronary artery disease with 75% stenosis distal left main, 70% stenosis proximal LAD, 75% stenosis mid LAD, 95% stenosis OM1, 100% stenosis proximal RCA (culprit vessel) 3.  Mildly reduced left ventricular function with estimated LV ejection fraction 45 to 50% 4.  Successful primary PCI with 2.5 x 22 mm Onyx frontier drug-eluting stent proximal RCA   Recommendations   1.  Dual antiplatelet therapy uninterrupted x 1 year 2.  Start metoprolol succinate 25 mg daily 3.  2D echocardiogram 4.  Start ACE/ARB prior to discharge 5.  Cardiac rehabilitation after discharge 6.  Close cardiology follow-up after discharge Risk Assessment/Calculations:              Physical Exam:   VS:  There were no vitals taken for this visit.   Wt Readings from Last 3 Encounters:  12/23/22 209 lb (94.8 kg)  12/22/22 207 lb (93.9 kg)  12/11/22 241 lb 10 oz (109.6 kg)    GEN: Well nourished, well developed in no acute distress NECK: No JVD; No carotid bruits CARDIAC: ***RRR, no murmurs, rubs, gallops RESPIRATORY:  Clear to auscultation without rales, wheezing or rhonchi  ABDOMEN: Soft, non-tender, non-distended EXTREMITIES:  No edema; No deformity   ASSESSMENT AND PLAN: .   *** {The patient has an active order for outpatient cardiac rehabilitation.   Please indicate if the patient is ready to start. Do NOT delete this.  It will auto delete.  Refresh note, then sign.              Click here to document readiness and see contraindications.  :1}  Cardiac Rehabilitation Eligibility Assessment      {Are you ordering a CV Procedure (e.g. stress test, cath, DCCV, TEE, etc)?   Press F2        :[id number]}  Dispo: ***  Signed,  , NP

## 2022-12-26 ENCOUNTER — Ambulatory Visit: Payer: Medicare Other | Admitting: Cardiology

## 2022-12-29 ENCOUNTER — Encounter: Payer: Medicare Other | Admitting: Physical Therapy

## 2022-12-30 ENCOUNTER — Telehealth: Payer: Self-pay | Admitting: Cardiology

## 2022-12-30 ENCOUNTER — Telehealth: Payer: Self-pay

## 2022-12-30 ENCOUNTER — Ambulatory Visit: Payer: Medicare Other | Admitting: Family Medicine

## 2022-12-30 NOTE — Telephone Encounter (Signed)
Transition Care Management Unsuccessful Follow-up Telephone Call  Date of discharge and from where:  [place] 8/12   Attempts:  1st Attempt  Reason for unsuccessful TCM follow-up call:  No answer/busy   Lenard Forth [facility] Guide, [facility] [phone] [address] [place], [place], [place] [address] Phone: [phone] Email: [email or web address]

## 2022-12-30 NOTE — Telephone Encounter (Signed)
Transition Care Management Unsuccessful Follow-up Telephone Call  Date of discharge and from where:  [place] 8/12  Attempts:  2nd Attempt  Reason for unsuccessful TCM follow-up call:  No answer/busy

## 2022-12-30 NOTE — Telephone Encounter (Signed)
Patient wants to get orders for cardiac rehab.

## 2022-12-31 ENCOUNTER — Encounter: Payer: Medicare Other | Admitting: Physical Therapy

## 2022-12-31 ENCOUNTER — Other Ambulatory Visit: Payer: Medicare Other

## 2022-12-31 NOTE — Progress Notes (Signed)
   12/31/2022  Patient ID: Jon [name], male   DOB: 1945-05-26, 77 y.o.   MRN: [id number]  S/O Telephone follow-up to check on DM control  Diabetes Management Plan -Current regimen:  Synjardy 12.5/500mg  BID, Tresiba 20 units daily, Ozempic 0.5mg  weekly -Has not taken insulin in 4 days due to low BG - FBG 87-104 -There have been a couple of mornings when he first wakes up having s/sx of hypoglycemia   Medication Consultation -Patient had recent MI and states he continues to feel SOB and has concern with lingering hoarseness of voice -Patient also has documented diagnosis of COPD (centrilobular emphysema) and wonders if SOB is related to this or recent MI -Patient was recently seen by  ENT and states he was cleared of any cancer diagnosis; wants to make sure Dr. Linford Arnold received result reports from this visit  A/P  Diabetes Management Plan -Recommend continuing to hold Tresiba at this time and decrease Synjardy to 1 tablet daily until no longer experience hypoglycemia -Continue Ozempic 0.5mg  weekly -Continue to monitor FBG and [name]-prandial BG regularly  Medication Consultation -Consulting Dr. Linford Arnold to make sure she has received information from patient's ENT visit; patient sees her again 9/13 -Patient sees [name] with cardiology for further evaluation 9/9  Follow-up:  Telephone visit to check on home BG 9/4  Lenna Gilford, PharmD, DPLA

## 2022-12-31 NOTE — Telephone Encounter (Signed)
He has not yet been seen in clinic follow-up since his discharge.  I have not seen him since September 2023 when I referred him to [facility] for CABG which he never did.  We usually wait till he is seen in follow-up to determine if he is safe for referral to cardiac rehab. He should have an outpatient visit scheduled soon with either me or an APP.  Otherwise needs to have one scheduled.  [name], MD

## 2023-01-05 ENCOUNTER — Encounter: Payer: Medicare Other | Admitting: Physical Therapy

## 2023-01-05 NOTE — Telephone Encounter (Signed)
Patient has an appt scheduled with [name] on 9/9.

## 2023-01-06 ENCOUNTER — Other Ambulatory Visit: Payer: Self-pay | Admitting: Family Medicine

## 2023-01-06 DIAGNOSIS — F515 Nightmare disorder: Secondary | ICD-10-CM

## 2023-01-07 ENCOUNTER — Encounter: Payer: Medicare Other | Admitting: Physical Therapy

## 2023-01-07 ENCOUNTER — Telehealth: Payer: Self-pay | Admitting: Family Medicine

## 2023-01-07 MED ORDER — ALBUTEROL SULFATE HFA 108 (90 BASE) MCG/ACT IN AERS: 2.0000 | INHALATION_SPRAY | Freq: Four times a day (QID) | RESPIRATORY_TRACT | 0 refills | Status: DC | PRN

## 2023-01-07 NOTE — Telephone Encounter (Signed)
Call patient and let him know him, send in an albuterol inhaler for him to use as needed for shortness of breath to see if he feels like that is helpful and to see if the shortness of breath might be coming more from his COPD then from his heart.  Occasion pended he has 5 pharmacies on file so not sure where to send it.  Please sign and send to current pharmacy.

## 2023-01-12 ENCOUNTER — Other Ambulatory Visit: Payer: Self-pay

## 2023-01-12 ENCOUNTER — Other Ambulatory Visit: Payer: Self-pay | Admitting: Family Medicine

## 2023-01-13 ENCOUNTER — Other Ambulatory Visit: Payer: Self-pay | Admitting: Family Medicine

## 2023-01-13 ENCOUNTER — Other Ambulatory Visit: Payer: Self-pay

## 2023-01-13 MED ORDER — METOPROLOL SUCCINATE ER 25 MG PO TB24
25.0000 mg | ORAL_TABLET | Freq: Every day | ORAL | 3 refills | Status: DC
  Filled 2023-01-13: qty 90, 90d supply, fill #0
  Filled 2023-04-11: qty 90, 90d supply, fill #1

## 2023-01-14 ENCOUNTER — Telehealth: Payer: Self-pay | Admitting: Family Medicine

## 2023-01-14 ENCOUNTER — Encounter: Payer: Medicare Other | Admitting: Physical Therapy

## 2023-01-14 ENCOUNTER — Other Ambulatory Visit: Payer: Medicare Other

## 2023-01-14 ENCOUNTER — Other Ambulatory Visit: Payer: Self-pay

## 2023-01-14 NOTE — Progress Notes (Signed)
   01/14/2023  Patient ID: Jon [name], male   DOB: [birth year], 77 y.o.   MRN: [id number]  Patient outreach to follow-up on blood glucose control.  [name] answered but had just sat down to eat, so we rescheduled the telephone visit to tomorrow morning at 9:30am.  Lenna Gilford, PharmD, DPLA

## 2023-01-14 NOTE — Telephone Encounter (Signed)
Patient would like metoprolol succinate (TOPROL-XL) 25 MG 24 hr tablet sent to:   [facility] [address] ([address]), [place] - [address]) [place] [address] Phone: [phone] Fax: [phone]

## 2023-01-14 NOTE — Telephone Encounter (Signed)
Pt requested this medication via mychart and asked that it be sent to  [facility]. This is where HE wanted it to be sent NOT [facility].

## 2023-01-15 ENCOUNTER — Other Ambulatory Visit: Payer: Self-pay

## 2023-01-15 ENCOUNTER — Other Ambulatory Visit: Payer: Medicare Other

## 2023-01-15 MED FILL — Ticagrelor Tab 90 MG: ORAL | 90 days supply | Qty: 180 | Fill #0 | Status: AC

## 2023-01-16 NOTE — Progress Notes (Signed)
   01/16/2023  Patient ID: Jon [name], male   DOB: 11-13-1945, 77 y.o.   MRN: [id number]  S/O Telephone visit to follow-up on diabetes management and home BG  Diabetes Management -Current regimen:  Ozempic 0.5mg  weekly -Patient endorses that he has not used [place] or Synjardy in >2 weeks  -FBG 100-125 with Ozempic 0.5mg  weekly monotherapy -7/25 A1c was 7.8% -Patient endorses decreased appetite, especially since recent MI  Medication Management -[name] was not aware albuterol inhaler had been sent to [facility], so he has not picked this medication up or stated using it yet  A/P  Diabetes Management -Patient sees Dr. Linford Arnold again 9/13, and I recommended continuing to hold [place] until that appointment  -I do not recommend increasing Ozempic at this time based on decreased appetite -Could consider addition of just Jardiance for renal protection without additional agent that could lead to hypoglycemia  Medication Management -Patient plans to pick albuterol rescue inhaler up from the pharmacy  Follow-up:  None scheduled at this time but can as recommended per PCP  Lenna Gilford, PharmD, DPLA

## 2023-01-19 ENCOUNTER — Encounter: Payer: Self-pay | Admitting: Physician Assistant

## 2023-01-19 ENCOUNTER — Encounter: Payer: Medicare Other | Admitting: Physical Therapy

## 2023-01-19 ENCOUNTER — Ambulatory Visit: Payer: Medicare Other | Attending: Physician Assistant | Admitting: Physician Assistant

## 2023-01-19 VITALS — BP 122/80 | HR 81 | Ht 73.0 in | Wt 199.4 lb

## 2023-01-19 DIAGNOSIS — Z8673 Personal history of transient ischemic attack (TIA), and cerebral infarction without residual deficits: Secondary | ICD-10-CM | POA: Diagnosis not present

## 2023-01-19 DIAGNOSIS — I739 Peripheral vascular disease, unspecified: Secondary | ICD-10-CM | POA: Diagnosis not present

## 2023-01-19 DIAGNOSIS — K118 Other diseases of salivary glands: Secondary | ICD-10-CM

## 2023-01-19 DIAGNOSIS — I679 Cerebrovascular disease, unspecified: Secondary | ICD-10-CM | POA: Diagnosis not present

## 2023-01-19 DIAGNOSIS — R490 Dysphonia: Secondary | ICD-10-CM

## 2023-01-19 DIAGNOSIS — I10 Essential (primary) hypertension: Secondary | ICD-10-CM

## 2023-01-19 DIAGNOSIS — I771 Stricture of artery: Secondary | ICD-10-CM

## 2023-01-19 DIAGNOSIS — I25708 Atherosclerosis of coronary artery bypass graft(s), unspecified, with other forms of angina pectoris: Secondary | ICD-10-CM | POA: Diagnosis not present

## 2023-01-19 DIAGNOSIS — R0602 Shortness of breath: Secondary | ICD-10-CM | POA: Diagnosis not present

## 2023-01-19 DIAGNOSIS — R29898 Other symptoms and signs involving the musculoskeletal system: Secondary | ICD-10-CM | POA: Diagnosis not present

## 2023-01-19 MED ORDER — CLOPIDOGREL BISULFATE 75 MG PO TABS: 75.0000 mg | ORAL_TABLET | Freq: Every day | ORAL | 3 refills | Status: DC

## 2023-01-19 MED ORDER — CLOPIDOGREL BISULFATE 75 MG PO TABS
300.0000 mg | ORAL_TABLET | Freq: Every day | ORAL | Status: AC
Start: 2023-01-19 — End: 2023-01-20

## 2023-01-19 NOTE — Patient Instructions (Addendum)
Medication Instructions:  Your physician recommends the following medication changes.  STOP TAKING: Brillinta  START TAKING: Clopidogrel/Plavix 300 mg once today Clopidogrel/Plavix 75 mg daily after today  *If you need a refill on your cardiac medications before your next appointment, please call your pharmacy*   Lab Work: NONE If you have labs (blood work) drawn today and your tests are completely normal, you will receive your results only by: MyChart Message (if you have MyChart) OR A paper copy in the mail If you have any lab test that is abnormal or we need to change your treatment, we will call you to review the results.   Testing/Procedures: Your physician has requested that you have an ankle brachial index (ABI). During this test an ultrasound and blood pressure cuff are used to evaluate the arteries that supply the arms and legs with blood.  Allow thirty minutes for this exam.  There are no restrictions or special instructions.  This will take place at [address] ([facility] [address], [place] [address]   Follow-Up: At [facility], you and your health needs are our priority.  As part of our continuing mission to provide you with exceptional heart care, we have created designated Provider Care Teams.  These Care Teams include your primary Cardiologist (physician) and Advanced Practice Providers (APPs -  Physician Assistants and Nurse Practitioners) who all work together to provide you with the care you need, when you need it.  We recommend signing up for the patient portal called "MyChart".  Sign up information is provided on this After Visit Summary.  MyChart is used to connect with patients for Virtual Visits (Telemedicine).  Patients are able to view lab/test results, encounter notes, upcoming appointments, etc.  Non-urgent messages can be sent to your provider as well.   To learn more about what you can do with MyChart, go to  [email or web address].    Your next appointment:   Soonest available Provider:   You may see [name], MD   Other Instructions You have been referred to Cardiovascular Thoracic Surgery

## 2023-01-19 NOTE — Progress Notes (Signed)
Cardiology Office Note    Date:  01/19/2023   ID:  Jon [name], DOB May 05, 1946, MRN [id number]  PCP:  Agapito Games, MD  Cardiologist:  [name], MD  Electrophysiologist:  None   Chief Complaint: Hospital follow-up  History of Present Illness:   Jon [name] is a 77 y.o. male with history of known multivessel CAD with prior recommendation for CABG evaluation with recent inferior ST elevation MI status post PCI/DES to the RCA in 12/2022, left subclavian artery stenosis with associated subclavian steal status post stenting in 12/2021, remote CVA/TIA, DM2, HTN, HLD, prior tobacco use with likely emphysema, senile purpura, hoarseness, and parotid mass who presents for hospital follow-up as outlined below.  Remote ETT from 11/2017 was nondiagnostic due to submaximal heart rate.  Subsequent Lexiscan MPI in 11/2017 was low risk with an EF of 73%.  Echo from 2021 showed an EF of 60 to 65%, no regional wall motion maladies, grade 1 diastolic dysfunction, normal RV systolic function and ventricular cavity size, mildly dilated left atrium, mild mitral regurgitation, mild aortic valve sclerosis without evidence of stenosis, mild dilatation of the ascending aorta measuring 39 mm, and an estimated right atrial pressure of 3 mmHg.  Carotid artery ultrasound from 2021 showed no hemodynamically significant stenosis by duplex criteria in the extracranial cerebrovascular circulation.  Reversal of the left vertebral artery suggesting a proximal left subclavian artery stenosis was noted.  He was evaluated in 11/2021 for precordial pain, exertional dyspnea, and left arm numbness.  MRI of the brain in 11/2021 showed no acute intracranial abnormality with multiple small vessel infarcts and findings of chronic ischemic microangiopathy.  In this setting, he underwent echo in 11/2021 which showed an EF of 55 to 60%, no regional wall motion abnormalities, moderate asymmetric LVH of the basal septal  segment, grade 1 diastolic dysfunction, normal RV systolic function and ventricular cavity size, aortic valve sclerosis without evidence of stenosis, and an estimated right atrial pressure of 3 mmHg.  Zio patch at that time showed a predominant rhythm of sinus with an average rate of 64 bpm (range 47 to 98 bpm in sinus), 3 runs of NSVT lasting up to 12 beats, 1 run of atrial tachycardia lasting 5 beats, occasional PACs with a 2.1% burden, and rare PVCs.  Coronary CTA in 11/2021 that showed a calcium score of 562, which was the 67th percentile.  There was 50 to 69% stenosis in the ostial LAD, greater than 70% stenosis in the mid LAD, less than 25% stenosis in a nondominant LCx, and greater than 70% stenosis in the proximal RCA.  Cardiac catheterization was recommended.  CTA head and neck in 11/2021 showed severe stenosis of the proximal left subclavian artery, multifocal severe stenosis of left V2 vertebral artery, and less than 50% stenosis of bilateral bifurcation ICA atherosclerosis.  Partially imaged left parotid tail mass had increased in size relative to CT from neck in 2018.  Prior to cardiac cath, he was evaluated by vascular surgery for left subclavian artery stenosis and underwent successful stenting in 12/2021.  LHC in 12/2022 showed significant multivessel CAD as outlined below.  In follow-up, it was recommended he be evaluated by CVTS for CABG as the best revascularization option.  Referral was made, though canceled by patient despite extensive discussion with primary cardiologist.  He was admitted to [facility] from 8/1 through 12/14/2022 with an inferior ST elevation MI.  Emergent LHC showed significant three-vessel CAD including 75% distal left main stenosis, 70% proximal  LAD stenosis, 75% stenosis in the mid LAD, 95% stenosis in OM1, and 100% stenosis of the proximal RCA which was felt to be the culprit vessel.  He underwent successful PCI/DES to the proximal RCA.  Echo during the admission showed an EF of 50  to 55% with mid and distal inferior wall hypokinesis, grade 1 diastolic dysfunction, normal RV systolic function and ventricular cavity size, mild mitral regurgitation, and an estimated right atrial pressure of 3 mmHg.  If he presented to the ED on 12/22/2022 with continued shortness of breath.  High-sensitivity troponin was downtrending from prior peak of greater than 24,000.  Chest x-ray showed emphysema with no acute cardiopulmonary process.  EKG showed sinus rhythm with evolving changes consistent with prior inferior MI.  He was discharged outpatient follow-up.  He comes in today and is without symptoms of ongoing angina or cardiac decompensation.  He continues to note longstanding dyspnea that appears to be more pronounced following his MI.  He also notes a 6 to 68-month history of voice hoarseness and was recently evaluated by ENT with laryngoscopy being unrevealing.  They have recommended he establish care with pulmonology.  No falls or symptoms concerning for bleeding.  He remains adherent to cardiac medications including aspirin and ticagrelor.  No dizziness, presyncope, or syncope.  He does note a longstanding history of bilateral lower extremity weakness.  He does question if he would ever move forward with coronary artery bypass grafting despite multiple providers recommending this.   Labs independently reviewed: 12/2022 - potassium 5.3 (hemolysis noted), BUN 21, serum creatinine 1.74, albumin 4.1, AST/ALT normal, Hgb 14.6, PLT 268, LP(a) 132.8, TC 109, TG 103, HDL 37, LDL 51 11/2022 - A1c 7.8 11/2020 - TSH 5.14  Past Medical History:  Diagnosis Date   BENIGN POSITIONAL VERTIGO 05/20/2010   Qualifier: Diagnosis of  By: Linford Arnold MD, Catherine     Centrilobular emphysema [facility])    Cerebrovascular disease 07/15/2018   Prior CVA & TIA. Carotid CTA ~50% Bilateral ICA,  Multifocal severe stenosis of the left V2 vertebral artery.  Right vertebral artery patent without significant stenosis.    Coronary artery disease involving native coronary artery with angina pectoris (HCC) 11/18/2017   Severe LM, LAD & RCA stenosis indicated on Coronary CTA => CaCardiac Cath 01/02/2022: dLM ~50%; mLAD 85%@D1  90% (1,1,1) - m-dLAD 90$ @ 65% D2 (0,1,1), RI 75%, pLCx 30%-OM1 60%, pRCA 25% - p-mRCA hazy 85%. Normal LVEF by Echo, EDP 16 mmHg. = Best option CABGrdiac Cath    Current every day smoker    No interest in quitting   Diabetes mellitus, type II, insulin dependent (HCC)    With peripheral neuropathy, peripheral vascular disease and coronary artery disease as complications.   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 => 12/17/2021: L Subclavia A PTA=Stent with resolution of Sx.   TIA (transient ischemic attack) 11/11/2021   Brief episode of left sided arm pain and slurred speech    Past Surgical History:  Procedure Laterality Date   CORONARY/GRAFT ACUTE MI REVASCULARIZATION N/A 12/11/2022   Procedure: Coronary/Graft Acute MI Revascularization;  Surgeon: Marcina Millard, MD;  Location: [facility] INVASIVE CV LAB;  Service: Cardiovascular;  Laterality: N/A;   LEFT HEART CATH AND CORONARY ANGIOGRAPHY N/A 01/02/2022   Procedure: LEFT HEART  CATH AND CORONARY ANGIOGRAPHY;  Surgeon: Marykay Lex, MD;  Location: [facility] INVASIVE CV LAB:: dLM ~50%; mLAD 85%@D1  90% (1,1,1) - m-dLAD 90$ @ 65% D2 (0,1,1), RI 75%, pLCx 30%-OM1 60%, pRCA 25% - p-mRCA hazy 85%. Normal LVEF by Echo, EDP 16 mmHg. = Best option CABG   LEFT HEART CATH AND CORONARY ANGIOGRAPHY N/A 12/11/2022   Procedure: LEFT HEART CATH AND CORONARY ANGIOGRAPHY;  Surgeon: Marcina Millard, MD;  Location: [facility] INVASIVE CV LAB;  Service: Cardiovascular;  Laterality: N/A;   PILONIDAL CYST EXCISION  1960, 61, 62, 63    TRANSTHORACIC ECHOCARDIOGRAM  11/28/2021   EF 55 to 60%.  No RWMA.  Moderate asymmetric LVH-basal septal.  GR 1 DD.  Normal RV.  Normal MV.  AOV sclerosis without stenosis.  Normal RAP.   UPPER EXTREMITY ANGIOGRAPHY Left 12/17/2021   Procedure: Upper Extremity Angiography;  Surgeon: Renford Dills, MD;  Location: [facility] INVASIVE CV LAB;  Service: Cardiovascular:: High-grade stenosis just distal to the origin of the L-Subclavian A, normal Innom A & L Carotid A => Ost-Prox L SubCl A 8x37x80 Stent - dilated with 9 mm balloon.   VASECTOMY     Zio Patch Monitor  11/2021   Predom Rhtyhm: SR - HR range 47-98 bpm, AVG 64 bpm.  3 runs of NS VT  (> 4 PVC beats): Fastest: 5 beats - HR 125-162 bpm, ~ 145 bpm, 2.1 sec; Longest with Fastest Avg-12 beats, 150-158 bpm, ~ 155 bpm, 4.6 sec.  1  5 beat Atrial Run: HR 125, Avg 120 bpm,   No Sustained Arrhythmias,   Occasional isolated PACs (2.1%) & Rare isolated PVCs (<1%).  No patient triggered events.    Current Medications: Current Meds  Medication Sig   Accu-Chek Softclix Lancets lancets Use to check blood sugars twice daily   albuterol (VENTOLIN HFA) 108 (90 Base) MCG/ACT inhaler Inhale 2 puffs into the lungs every 6 (six) hours as needed for wheezing or shortness of breath.   aspirin EC 81 MG tablet Take 81 mg by mouth daily. Swallow whole.   atorvastatin (LIPITOR) 80 MG tablet TAKE 1 TABLET BY MOUTH AT  BEDTIME   Blood Glucose Monitoring Suppl (ACCU-CHEK GUIDE ME) w/Device KIT Check glucose three times daily. Dx. Code E11.8   clopidogrel (PLAVIX) 75 MG tablet Take 1 tablet (75 mg total) by mouth daily.   Empagliflozin-metFORMIN HCl (SYNJARDY) 12.5-500 MG TABS Take 1 tablet by mouth 2 (two) times daily.   glucose blood (ACCU-CHEK GUIDE) test strip Dx DM E11.9. Check fasting blood sugar every morning.   insulin degludec (TRESIBA FLEXTOUCH) 100 UNIT/ML FlexTouch Pen Inject 20 Units into the skin daily.   lisinopril (ZESTRIL) 2.5 MG tablet Take 2.5 mg by  mouth daily.   metoprolol succinate (TOPROL-XL) 25 MG 24 hr tablet Take 1 tablet (25 mg total) by mouth daily.   Semaglutide,0.25 or 0.5MG /DOS, 2 MG/3ML SOPN Inject 0.5 mg into the skin every 7 (seven) days.   traZODone (DESYREL) 50 MG tablet TAKE 1 AND 1/2 TABLETS BY MOUTH  AT BEDTIME FOR SLEEP   [DISCONTINUED] ticagrelor (BRILINTA) 90 MG TABS tablet Take 1 tablet (90 mg total) by mouth 2 (two) times daily.   Current Facility-Administered Medications for the 01/19/23 encounter (Office Visit) with Sondra Barges, PA-C  Medication   clopidogrel (PLAVIX) tablet 300 mg    Allergies:   Gabapentin, Lisinopril, and Metformin and related   Social History   Socioeconomic History   Marital status: Divorced    Spouse  name: Not on file   Number of children: 2   Years of education: 12th grade   Highest education level: High school graduate  Occupational History   Occupation: Retired  Tobacco Use   Smoking status: Former    Current packs/day: 0.00    Average packs/day: 1 pack/day for 62.0 years (62.0 ttl pk-yrs)    Types: Cigarettes    Start date: 01/13/1960    Quit date: 01/12/2022    Years since quitting: 1.0   Smokeless tobacco: Never  Vaping Use   Vaping status: Never Used  Substance and Sexual Activity   Alcohol use: No   Drug use: No   Sexual activity: Not on file  Other Topics Concern   Not on file  Social History Narrative   Lives alone and Likes to play golf 2 x 3 times a week.   Social Determinants of Health   Financial Resource Strain: Low Risk  (08/01/2022)   Overall Financial Resource Strain (CARDIA)    Difficulty of Paying Living Expenses: Not hard at all  Food Insecurity: No Food Insecurity (08/01/2022)   Hunger Vital Sign    Worried About Running Out of Food in the Last Year: Never true    Ran Out of Food in the Last Year: Never true  Transportation Needs: No Transportation Needs (08/06/2022)   PRAPARE - Administrator, Civil Service (Medical): No    Lack of  Transportation (Non-Medical): No  Physical Activity: Inactive (08/01/2022)   Exercise Vital Sign    Days of Exercise per Week: 0 days    Minutes of Exercise per Session: 0 min  Stress: No Stress Concern Present (08/01/2022)   [employer] of Occupational Health - Occupational Stress Questionnaire    Feeling of Stress : Not at all  Social Connections: Socially Isolated (08/01/2022)   Social Connection and Isolation Panel [NHANES]    Frequency of Communication with Friends and Family: Three times a week    Frequency of Social Gatherings with Friends and Family: Once a week    Attends Religious Services: Never    Database administrator or Organizations: No    Attends Engineer, structural: Never    Marital Status: Divorced     Family History:  The patient's family history includes Diabetes in an other family member. There is no history of Colon cancer.  ROS:   12-point review of systems is negative unless otherwise noted in the HPI.   EKGs/Labs/Other Studies Reviewed:    Studies reviewed were summarized above. The additional studies were reviewed today:  2D echo 12/11/2022: 1. Left ventricular ejection fraction, by estimation, is 50 to 55%. The  left ventricle has low normal function. The left ventricle demonstrates  regional wall motion abnormalities (see scoring diagram/findings for  description). Left ventricular diastolic   parameters are consistent with Grade I diastolic dysfunction (impaired  relaxation).   2. Right ventricular systolic function is normal. The right ventricular  size is normal.   3. The mitral valve is normal in structure. Mild mitral valve  regurgitation. No evidence of mitral stenosis.   4. The aortic valve is normal in structure. Aortic valve regurgitation is  not visualized. No aortic stenosis is present.   5. The inferior vena cava is normal in size with greater than 50%  respiratory variability, suggesting right atrial pressure of 3 mmHg.   __________  LHC 12/11/2022:   1st Mrg lesion is 75% stenosed.   Prox LAD lesion is 70%  stenosed.   Mid LAD lesion is 75% stenosed.   Mid LM to Dist LM lesion is 75% stenosed.   Prox RCA lesion is 100% stenosed.   A drug-eluting stent was successfully placed using a STENT ONYX FRONTIER 2.5X22.   Post intervention, there is a 0% residual stenosis.   There is mild left ventricular systolic dysfunction.   LV end diastolic pressure is mildly elevated.   The left ventricular ejection fraction is 45-50% by visual estimate.   1.  Inferior STEMI 2.  Three-vessel coronary artery disease with 75% stenosis distal left main, 70% stenosis proximal LAD, 75% stenosis mid LAD, 95% stenosis OM1, 100% stenosis proximal RCA (culprit vessel) 3.  Mildly reduced left ventricular function with estimated LV ejection fraction 45 to 50% 4.  Successful primary PCI with 2.5 x 22 mm Onyx frontier drug-eluting stent proximal RCA   Recommendations   1.  Dual antiplatelet therapy uninterrupted x 1 year 2.  Start metoprolol succinate 25 mg daily 3.  2D echocardiogram 4.  Start ACE/ARB prior to discharge 5.  Cardiac rehabilitation after discharge 6.  Close cardiology follow-up after discharge __________  LHC 01/02/2022:   Dist LM to Ost LAD lesion is 50% stenosed.   Mid LAD lesion is 85% stenosed with 90% stenosed side branch in 1st Diag.   Mid LAD to Dist LAD lesion is 90% stenosed with 65% stenosed side branch in 2nd Diag.   Prox Cx to Mid Cx lesion is 30% stenosed.   Ramus lesion is 75% stenosed.   1st Mrg lesion is 60% stenosed.   Prox RCA lesion is 25% stenosed.   Prox RCA to Mid RCA lesion is 80% stenosed.   The left ventricular systolic function is normal by echocardiogram   LV end diastolic pressure is normal.   There is no aortic valve stenosis.   POST-OPERATIVE DIAGNOSIS: Multivessel CAD: Distal LM-ostial LAD eccentric 50% ([place] 1, 0, 1+. Proximal-mid LAD (eccentric) 80% involving D1 80% ([place]  0, 1, 1) -> distal mid LAD 90% involving 70% D2 ([place] 1, 1, 1) 80% proximal-mid RI 30% proximal LCx with 6% OM1 10% proximal RCA, calcified eccentric 80% mid RCA LVEDP 16 mmHg.   No AS     PLAN OF CARE: Discharge to home after PACU -> multivessel CAD warrants CABG evaluation as the Best Revasculariztion option.  Not favorable for PCI.   The patient wanted time to think about it and discuss with his family to determine whether or not he would want to go through with CABG or PCI.  Right now it sounds like he is more interested in medical management. For now we will continue aspirin and Plavix until decision is made about whether or not he wants to go for CABG. Would likely need to titrate up his antilipid management did titrate for LDL<55 We will reevaluate blood pressures and symptoms and follow-up visit in roughly 2 weeks.  He has been on ACE inhibitor but is on the list but not taking, will need to determine reasons for not being on medications and then likely consider ARB and beta-blocker. __________  Luci Bank patch 11/2021:   Zio Patch Wear Time:  12 days and 14 hours (2023-07-13T19:56:30-0400 to 2023-07-26T10:46:39-0400)   Predominant Underlying Rhythm is Sinus Rhythm: HR range 47-98 bpm, AVG 64 bpm.   3 runs of Non-sustained Ventricular Tachycardia (> 4 PVC beats): Fastest: 5 beats - HR range 125-162 bpm, Avg 145 bpm, 2.1 sec; Longest with Fastest Average-12 beats, 150-158 bpm, avg 155  bpm, 4.6 sec-   1 Atrial Run: 5 beats - max HR 125, Avg 120 bpm, * Sec   No Sustained Arrhythmias: Sustained Ventricular Tachycardia (VT), Supraventricular Tachycardia (SVT), Atrial Tachycardia (AT), Atrial Fibrillation (A-Fib), Atrial Flutter (A-Flutter)   Occasional isolated PACs (2.1%) with rare couplets and triplets.   Rare isolated PVCs (<1%) with some couplets, bigeminy and trigeminy.   No patient triggered events.   Overall pretty reassuring study.  Short Bursts of Nonsustained Ventricular Tachycardia  none unsuspected with multivessel coronary artery disease.  This will be treated with the same medical treatment to be used for treating coronary disease. __________  2D echo 11/28/2021: 1. Left ventricular ejection fraction, by estimation, is 55 to 60%. The  left ventricle has normal function. The left ventricle has no regional  wall motion abnormalities. There is moderate asymmetric left ventricular  hypertrophy of the basal-septal  segment. Left ventricular diastolic parameters are consistent with Grade I  diastolic dysfunction (impaired relaxation).   2. Right ventricular systolic function is normal. The right ventricular  size is normal.   3. The mitral valve is normal in structure. No evidence of mitral valve  regurgitation. No evidence of mitral stenosis.   4. The aortic valve is normal in structure. Aortic valve regurgitation is  not visualized. Aortic valve sclerosis/calcification is present, without  any evidence of aortic stenosis. Aortic valve area, by VTI measures 2.85  cm. Aortic valve mean gradient  measures 3.0 mmHg.   5. The inferior vena cava is normal in size with greater than 50%  respiratory variability, suggesting right atrial pressure of 3 mmHg.  __________  Coronary CTA 11/18/2021: FINDINGS: Aorta:  Normal size.  No calcifications.  No dissection.   Aortic Valve:  Trileaflet.  No calcifications.   Coronary Arteries:  Normal coronary origin.  Right dominance.   RCA is a dominant artery that gives rise to PDA and PLA. There is calcified and non calcified plaque causing severe proximal RCA stenosis (>70%).   Left main gives rise to LAD and LCX arteries.   LAD has calcified plaque in the ostium causing moderate stenosis (50-69%). There is non calcified plaque in the mid LAD causing severe stenosis (>70%).   LCX is a non-dominant artery that gives rise to two obtuse marginal branches. There is calcified plaque in the proximal LCx causing minimal stenosis  (<25%).   Other findings:   Normal pulmonary vein drainage into the left atrium.   Normal left atrial appendage without a thrombus.   Normal size of the pulmonary artery.   IMPRESSION: 1. Coronary calcium score of 562. This was 67th percentile for age and sex matched control.   2. Normal coronary origin with right dominance.   3. Non calcified plaque causing severe stenosis (>70%) in the proximal RCA and mid LAD.   4. CAD-RADS 4 Severe stenosis. (70-99% or > 50% left main). Cardiac catheterization is recommended.   5. Consider symptom-guided anti-ischemic pharmacotherapy as well as risk factor modification per guideline directed care. __________  Carotid artery ultrasound 10/24/2019: IMPRESSION: Color duplex indicates minimal heterogeneous plaque, with no hemodynamically significant stenosis by duplex criteria in the extracranial cerebrovascular circulation.   Reversal of the left vertebral artery, suggesting a proximal left subclavian artery stenosis. Office based repeat bilateral upper extremity blood pressure assessment may be useful, as there is a borderline significant differential measured on today's study. Alternatively, CT angiogram of the chest to include the aortic arch may be considered if further workup is indicated. __________  2D echo 09/14/2019: 1. Left ventricular ejection fraction, by estimation, is 60 to 65%. The  left ventricle has normal function. The left ventricle has no regional  wall motion abnormalities. Left ventricular diastolic parameters are  consistent with Grade I diastolic  dysfunction (impaired relaxation).   2. Right ventricular systolic function is normal. The right ventricular  size is normal.   3. Left atrial size was mildly dilated.   4. The mitral valve is normal in structure. Mild mitral valve  regurgitation. No evidence of mitral stenosis.   5. The aortic valve is normal in structure. Aortic valve regurgitation is  not  visualized. Mild aortic valve sclerosis is present, with no evidence  of aortic valve stenosis.   6. There is mild dilatation of the ascending aorta measuring 39 mm.   7. The inferior vena cava is normal in size with greater than 50%  respiratory variability, suggesting right atrial pressure of 3 mmHg.  __________  Eugenie Birks MPI 12/08/2017: Nuclear stress EF: 73%. No T wave inversion was noted during stress. There was no ST segment deviation noted during stress. This is a low risk study.   No reversible ischemia. LVEF 73% with normal wall motion. This is a low risk study. __________  ETT 11/19/2017: Blood pressure demonstrated a normal response to exercise. There was no ST segment deviation noted during stress. Mildly impaired exercise capacity. The patient reached 7 mets and stopped due to SOB and fatigue. Submaximal stress test as patient only achieved 77% of MPHR Nondiagnostic study due to submaximal HR. Recommend coronary CTA or nuclear stress test.    EKG:  EKG is ordered today.  The EKG ordered today demonstrates NSR, 81 bpm, left axis deviation, low voltage QRS, poor R wave progression along the precordial leads, prior inferior and anterolateral MI, nonspecific inferolateral ST-T changes consistent with prior MI and improved from prior  Recent Labs: 12/22/2022: ALT 22; B Natriuretic Peptide 206.6; BUN 21; Creatinine, Ser 1.74; Hemoglobin 14.6; Platelets 268; Potassium 5.3; Sodium 135  Recent Lipid Panel    Component Value Date/Time   CHOL 109 12/11/2022 0144   CHOL 105 02/12/2022 0941   TRIG 103 12/11/2022 0144   HDL 37 (L) 12/11/2022 0144   HDL 36 (L) 02/12/2022 0941   CHOLHDL 2.9 12/11/2022 0144   VLDL 21 12/11/2022 0144   LDLCALC 51 12/11/2022 0144   LDLCALC 46 02/12/2022 0941   LDLCALC 78 11/22/2020 1420    PHYSICAL EXAM:    VS:  BP 122/80 (BP Location: Left Arm, Patient Position: Sitting, Cuff Size: Normal)   Pulse 81   Ht 6\' 1"  (1.854 m)   Wt 199 lb 6.4 oz  (90.4 kg)   SpO2 95%   BMI 26.31 kg/m   BMI: Body mass index is 26.31 kg/m.  Physical Exam Vitals reviewed.  Constitutional:      Appearance: He is well-developed.  HENT:     Head: Normocephalic and atraumatic.  Eyes:     General:        Right eye: No discharge.        Left eye: No discharge.  Cardiovascular:     Rate and Rhythm: Normal rate and regular rhythm.     Pulses:          Posterior tibial pulses are 1+ on the right side and 1+ on the left side.     Heart sounds: Normal heart sounds, S1 normal and S2 normal. Heart sounds not distant. No midsystolic click and no opening snap.  No murmur heard.    No friction rub.  Pulmonary:     Effort: Pulmonary effort is normal. No respiratory distress.     Breath sounds: Normal breath sounds. No decreased breath sounds, wheezing or rales.  Chest:     Chest wall: No tenderness.  Abdominal:     General: There is no distension.  Musculoskeletal:     Cervical back: Normal range of motion.     Right lower leg: No edema.     Left lower leg: No edema.  Skin:    General: Skin is warm and dry.     Nails: There is no clubbing.  Neurological:     Mental Status: He is alert and oriented to person, place, and time.  Psychiatric:        Speech: Speech normal.        Behavior: Behavior normal.        Thought Content: Thought content normal.        Judgment: Judgment normal.     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  Status post recent PCI/DES to the RCA in the setting of inferior STEMI.  Previously recommended to be evaluated by CVTS for consideration of CABG as best revascularization option given multivessel disease, though appointment was canceled by patient.   Again recommend CVTS evaluation for CABG, referral made today.  Patient indicates he is unclear if he will move forward with this.  He was made aware of possible cardiopulmonary death and accepts this risk and sound state of mind.  Continue aggressive risk factor modification and secondary prevention including antiplatelet therapy with aspirin.  We will transition him from ticagrelor to clopidogrel with a loading dose 300 mg x 1 followed by 75 mg daily thereafter to see if transitioning off ticagrelor improves his dyspnea.  Otherwise, he will continue atorvastatin, lisinopril, and metoprolol.  Recommend cardiac rehab.  Dyspnea: Likely multifactorial including underlying multivessel CAD and a component of emphysema with pulmonology evaluation pending.  We will transition off ticagrelor to see if this helps with his breathing as outlined above.  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  It is unclear if this is contributing to his focal abnormality or not.  Recommend patient follow-up with PCP.  Recent ENT eval was unrevealing.  No evidence of significant aortic valve insufficiency on echo.  HTN: Blood pressure is well-controlled in the office today.  He remains on lisinopril 2.5 mg and Toprol-XL 25 mg.  HLD: LDL 51 in 12/2022 with normal AST/ALT at that time.  Target LDL less than 55.  Remains on atorvastatin 80 mg.  History of CVA/TIA with cerebrovascular disease: No new deficits.  Antiplatelet and statin therapy as outlined above.  Outpatient cardiac monitoring without evidence of A-fib/flutter.  Follow-up with PCP.  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  Antiplatelet therapy as outlined above.  Lower extremity weakness: Diminished pedal pulses.  Schedule ABI.    Disposition: F/u with Dr. Herbie Baltimore or an APP in 1 to 2 months.   Medication Adjustments/Labs and Tests Ordered: Current  medicines are reviewed at length with the patient today.  Concerns regarding medicines are outlined above. Medication changes, Labs and Tests ordered today are summarized above and listed in the Patient Instructions accessible in Encounters.   Signed, [name], PA-C 01/19/2023 4:32 PM     [facility] [place] [address] [place], [place] [address] [phone]

## 2023-01-20 ENCOUNTER — Telehealth: Payer: Self-pay

## 2023-01-20 NOTE — Telephone Encounter (Addendum)
Forwarding to [place] as an Financial planner.  [employer] PAP shipment for Ozempic 0.25/0.5 mg dose / 5 boxes, Tresiba 100 u dose/ 5 boxes, and NovoFine 32 tip needles  / 2 boxes received this morning. Please contact the patient to come and pick up their order today. Placed in the front PAP fridge with patient identifier. Thanks in advance.   Ozempic  NDC: [id number] LOT: [id number] EXP: 2024-07-09  Evaristo Bury  NDC: 6440-3474-25 LOT: [id number] EXP: 2025-04-10  NovoFine  LIST: [id number]  LOT: [id number] EXP: 2027-07-10

## 2023-01-21 ENCOUNTER — Telehealth: Payer: Self-pay | Admitting: Family Medicine

## 2023-01-21 ENCOUNTER — Encounter: Payer: Medicare Other | Admitting: Physical Therapy

## 2023-01-21 DIAGNOSIS — R0602 Shortness of breath: Secondary | ICD-10-CM

## 2023-01-21 NOTE — Telephone Encounter (Signed)
Ref placed  Orders Placed This Encounter  Procedures   Ambulatory referral to Pulmonology    Referral Priority:   Routine    Referral Type:   Consultation    Referral Reason:   Specialty Services Required    Requested Specialty:   Pulmonary Disease    Number of Visits Requested:   1

## 2023-01-21 NOTE — Telephone Encounter (Signed)
Received incoming  voicemail from [facility] requesting referral for pulmonary. Please advise.

## 2023-01-22 NOTE — Telephone Encounter (Signed)
Referral and clinical notes have been faxed to [employer] through [employer]. Office will contact patient to schedule referral appointment.   Patient is scheduled for 01/27/2023 at 2:30 pm with Khabib Dgayli.

## 2023-01-23 ENCOUNTER — Encounter: Payer: Self-pay | Admitting: Family Medicine

## 2023-01-23 ENCOUNTER — Ambulatory Visit (INDEPENDENT_AMBULATORY_CARE_PROVIDER_SITE_OTHER): Payer: Medicare Other | Admitting: Family Medicine

## 2023-01-23 VITALS — BP 116/68 | HR 97

## 2023-01-23 DIAGNOSIS — R531 Weakness: Secondary | ICD-10-CM | POA: Diagnosis not present

## 2023-01-23 DIAGNOSIS — E1151 Type 2 diabetes mellitus with diabetic peripheral angiopathy without gangrene: Secondary | ICD-10-CM | POA: Diagnosis not present

## 2023-01-23 DIAGNOSIS — K5900 Constipation, unspecified: Secondary | ICD-10-CM | POA: Diagnosis not present

## 2023-01-23 DIAGNOSIS — R42 Dizziness and giddiness: Secondary | ICD-10-CM | POA: Diagnosis not present

## 2023-01-23 DIAGNOSIS — I080 Rheumatic disorders of both mitral and aortic valves: Secondary | ICD-10-CM | POA: Diagnosis not present

## 2023-01-23 DIAGNOSIS — I708 Atherosclerosis of other arteries: Secondary | ICD-10-CM | POA: Diagnosis not present

## 2023-01-23 DIAGNOSIS — N1831 Chronic kidney disease, stage 3a: Secondary | ICD-10-CM | POA: Diagnosis not present

## 2023-01-23 DIAGNOSIS — J432 Centrilobular emphysema: Secondary | ICD-10-CM | POA: Diagnosis not present

## 2023-01-23 DIAGNOSIS — N1832 Chronic kidney disease, stage 3b: Secondary | ICD-10-CM | POA: Diagnosis not present

## 2023-01-23 DIAGNOSIS — E8770 Fluid overload, unspecified: Secondary | ICD-10-CM | POA: Diagnosis not present

## 2023-01-23 DIAGNOSIS — Z1152 Encounter for screening for COVID-19: Secondary | ICD-10-CM | POA: Diagnosis not present

## 2023-01-23 DIAGNOSIS — E1122 Type 2 diabetes mellitus with diabetic chronic kidney disease: Secondary | ICD-10-CM | POA: Diagnosis not present

## 2023-01-23 DIAGNOSIS — R61 Generalized hyperhidrosis: Secondary | ICD-10-CM

## 2023-01-23 DIAGNOSIS — I440 Atrioventricular block, first degree: Secondary | ICD-10-CM | POA: Diagnosis not present

## 2023-01-23 DIAGNOSIS — R443 Hallucinations, unspecified: Secondary | ICD-10-CM | POA: Diagnosis not present

## 2023-01-23 DIAGNOSIS — I771 Stricture of artery: Secondary | ICD-10-CM | POA: Diagnosis not present

## 2023-01-23 DIAGNOSIS — I2582 Chronic total occlusion of coronary artery: Secondary | ICD-10-CM | POA: Diagnosis not present

## 2023-01-23 DIAGNOSIS — E785 Hyperlipidemia, unspecified: Secondary | ICD-10-CM | POA: Diagnosis not present

## 2023-01-23 DIAGNOSIS — R079 Chest pain, unspecified: Secondary | ICD-10-CM | POA: Diagnosis not present

## 2023-01-23 DIAGNOSIS — I10 Essential (primary) hypertension: Secondary | ICD-10-CM | POA: Diagnosis not present

## 2023-01-23 DIAGNOSIS — Z01810 Encounter for preprocedural cardiovascular examination: Secondary | ICD-10-CM | POA: Diagnosis not present

## 2023-01-23 DIAGNOSIS — Z8673 Personal history of transient ischemic attack (TIA), and cerebral infarction without residual deficits: Secondary | ICD-10-CM | POA: Diagnosis not present

## 2023-01-23 DIAGNOSIS — I480 Paroxysmal atrial fibrillation: Secondary | ICD-10-CM | POA: Diagnosis not present

## 2023-01-23 DIAGNOSIS — E119 Type 2 diabetes mellitus without complications: Secondary | ICD-10-CM | POA: Diagnosis not present

## 2023-01-23 DIAGNOSIS — I252 Old myocardial infarction: Secondary | ICD-10-CM | POA: Diagnosis not present

## 2023-01-23 DIAGNOSIS — I2510 Atherosclerotic heart disease of native coronary artery without angina pectoris: Secondary | ICD-10-CM | POA: Diagnosis not present

## 2023-01-23 DIAGNOSIS — R0602 Shortness of breath: Secondary | ICD-10-CM

## 2023-01-23 DIAGNOSIS — I493 Ventricular premature depolarization: Secondary | ICD-10-CM | POA: Diagnosis not present

## 2023-01-23 DIAGNOSIS — Z7902 Long term (current) use of antithrombotics/antiplatelets: Secondary | ICD-10-CM | POA: Diagnosis not present

## 2023-01-23 DIAGNOSIS — I25110 Atherosclerotic heart disease of native coronary artery with unstable angina pectoris: Secondary | ICD-10-CM | POA: Diagnosis not present

## 2023-01-23 DIAGNOSIS — Z743 Need for continuous supervision: Secondary | ICD-10-CM | POA: Diagnosis not present

## 2023-01-23 DIAGNOSIS — D62 Acute posthemorrhagic anemia: Secondary | ICD-10-CM | POA: Diagnosis not present

## 2023-01-23 DIAGNOSIS — I2119 ST elevation (STEMI) myocardial infarction involving other coronary artery of inferior wall: Secondary | ICD-10-CM | POA: Diagnosis present

## 2023-01-23 DIAGNOSIS — Z79899 Other long term (current) drug therapy: Secondary | ICD-10-CM | POA: Diagnosis not present

## 2023-01-23 DIAGNOSIS — R110 Nausea: Secondary | ICD-10-CM | POA: Diagnosis not present

## 2023-01-23 DIAGNOSIS — R404 Transient alteration of awareness: Secondary | ICD-10-CM | POA: Diagnosis not present

## 2023-01-23 DIAGNOSIS — F05 Delirium due to known physiological condition: Secondary | ICD-10-CM | POA: Diagnosis not present

## 2023-01-23 DIAGNOSIS — R9431 Abnormal electrocardiogram [ECG] [EKG]: Secondary | ICD-10-CM | POA: Diagnosis not present

## 2023-01-23 DIAGNOSIS — I7090 Unspecified atherosclerosis: Secondary | ICD-10-CM | POA: Diagnosis not present

## 2023-01-23 DIAGNOSIS — Z87891 Personal history of nicotine dependence: Secondary | ICD-10-CM | POA: Diagnosis not present

## 2023-01-23 DIAGNOSIS — D696 Thrombocytopenia, unspecified: Secondary | ICD-10-CM | POA: Diagnosis not present

## 2023-01-23 DIAGNOSIS — Z794 Long term (current) use of insulin: Secondary | ICD-10-CM | POA: Diagnosis not present

## 2023-01-23 DIAGNOSIS — I129 Hypertensive chronic kidney disease with stage 1 through stage 4 chronic kidney disease, or unspecified chronic kidney disease: Secondary | ICD-10-CM | POA: Diagnosis not present

## 2023-01-23 DIAGNOSIS — I200 Unstable angina: Secondary | ICD-10-CM | POA: Diagnosis not present

## 2023-01-23 DIAGNOSIS — Z7982 Long term (current) use of aspirin: Secondary | ICD-10-CM | POA: Diagnosis not present

## 2023-01-23 DIAGNOSIS — Z888 Allergy status to other drugs, medicaments and biological substances status: Secondary | ICD-10-CM | POA: Diagnosis not present

## 2023-01-23 NOTE — Telephone Encounter (Signed)
Pt has appt today will give him the samples at that time.

## 2023-01-23 NOTE — Progress Notes (Signed)
Acute Office Visit  Subjective:     Patient ID: Jon [name], male    DOB: 12-27-1945, 77 y.o.   MRN: [id number]  No chief complaint on file.   HPI Patient is in today for f/u today.  He says he drove here from [place] and felt fine until he walked from the parking lot to the our front door.  He said he started to feel weak, broke out in a sweat and started to feel nauseated and short of breath.  Though he denies any chest pain but says he feels like when he had his heart attack a month or so ago.  Says his voice is weak.  Started to feel very nauseated while he was sitting in the room.  ROS      Objective:    BP 116/68   Pulse 97   SpO2 99%    Physical Exam Constitutional:      Appearance: Normal appearance.     Comments: Pale   HENT:     Head: Normocephalic and atraumatic.     Right Ear: External ear normal.     Left Ear: External ear normal.     Nose: Nose normal.     Mouth/Throat:     Pharynx: Oropharynx is clear.  Eyes:     Conjunctiva/sclera: Conjunctivae normal.  Cardiovascular:     Rate and Rhythm: Normal rate and regular rhythm.  Pulmonary:     Comments: Winded and speaking softly.  Musculoskeletal:     Cervical back: Neck supple. No tenderness.  Lymphadenopathy:     Cervical: No cervical adenopathy.  Skin:    General: Skin is warm and dry.  Neurological:     Mental Status: He is alert and oriented to person, place, and time.  Psychiatric:        Mood and Affect: Mood normal.     No results found for any visits on 01/23/23.      Assessment & Plan:   Problem List Items Addressed This Visit       Other   Weakness generalized - Primary   Oxygen was normal at 98% pulse at 101.  Blood pressure was normal.  EKG with no significant change from prior.  Opted to call EMS for further treatment and evaluation as patient was also no condition to be able to drive home or leave the office in his current condition.  Discern for repeat MI.  Did go  ahead and place 2 L oxygen on him.  EKG shows normal sinus rhythm with left axis deviation.  History of prior infarct.  Unchanged from recent EKG from September 9.   He says it feels like when he had his MI on August 1.  Discussed case with paramedics once they were here.  Patient preferred to go to [place] over.  Patient was transferred to [facility] his son was notified.   No orders of the defined types were placed in this encounter.   No follow-ups on file.  Nani Gasser, MD

## 2023-01-25 ENCOUNTER — Inpatient Hospital Stay (HOSPITAL_COMMUNITY)
Admission: RE | Admit: 2023-01-25 | Discharge: 2023-02-09 | DRG: 236 | Disposition: A | Discharge status: Skilled Nursing Facility | Payer: Medicare Other | Source: Other Acute Inpatient Hospital | Attending: Surgery | Admitting: Surgery

## 2023-01-25 ENCOUNTER — Encounter (HOSPITAL_COMMUNITY): Payer: Self-pay

## 2023-01-25 ENCOUNTER — Encounter (HOSPITAL_COMMUNITY): Payer: Self-pay | Admitting: Internal Medicine

## 2023-01-25 ENCOUNTER — Other Ambulatory Visit: Payer: Self-pay

## 2023-01-25 DIAGNOSIS — Z87891 Personal history of nicotine dependence: Secondary | ICD-10-CM | POA: Diagnosis not present

## 2023-01-25 DIAGNOSIS — Z01818 Encounter for other preprocedural examination: Secondary | ICD-10-CM | POA: Diagnosis not present

## 2023-01-25 DIAGNOSIS — I708 Atherosclerosis of other arteries: Secondary | ICD-10-CM | POA: Diagnosis not present

## 2023-01-25 DIAGNOSIS — D696 Thrombocytopenia, unspecified: Secondary | ICD-10-CM | POA: Diagnosis present

## 2023-01-25 DIAGNOSIS — E785 Hyperlipidemia, unspecified: Secondary | ICD-10-CM | POA: Diagnosis present

## 2023-01-25 DIAGNOSIS — Z1152 Encounter for screening for COVID-19: Secondary | ICD-10-CM

## 2023-01-25 DIAGNOSIS — R443 Hallucinations, unspecified: Secondary | ICD-10-CM | POA: Diagnosis not present

## 2023-01-25 DIAGNOSIS — I2111 ST elevation (STEMI) myocardial infarction involving right coronary artery: Secondary | ICD-10-CM | POA: Diagnosis not present

## 2023-01-25 DIAGNOSIS — I2582 Chronic total occlusion of coronary artery: Secondary | ICD-10-CM | POA: Diagnosis not present

## 2023-01-25 DIAGNOSIS — E1142 Type 2 diabetes mellitus with diabetic polyneuropathy: Secondary | ICD-10-CM | POA: Diagnosis not present

## 2023-01-25 DIAGNOSIS — E119 Type 2 diabetes mellitus without complications: Secondary | ICD-10-CM | POA: Diagnosis not present

## 2023-01-25 DIAGNOSIS — I700 Atherosclerosis of aorta: Secondary | ICD-10-CM | POA: Diagnosis not present

## 2023-01-25 DIAGNOSIS — I25119 Atherosclerotic heart disease of native coronary artery with unspecified angina pectoris: Secondary | ICD-10-CM | POA: Diagnosis not present

## 2023-01-25 DIAGNOSIS — I2119 ST elevation (STEMI) myocardial infarction involving other coronary artery of inferior wall: Secondary | ICD-10-CM | POA: Diagnosis present

## 2023-01-25 DIAGNOSIS — J432 Centrilobular emphysema: Secondary | ICD-10-CM | POA: Diagnosis present

## 2023-01-25 DIAGNOSIS — Z951 Presence of aortocoronary bypass graft: Secondary | ICD-10-CM

## 2023-01-25 DIAGNOSIS — D62 Acute posthemorrhagic anemia: Secondary | ICD-10-CM | POA: Diagnosis not present

## 2023-01-25 DIAGNOSIS — F05 Delirium due to known physiological condition: Secondary | ICD-10-CM | POA: Diagnosis not present

## 2023-01-25 DIAGNOSIS — E8770 Fluid overload, unspecified: Secondary | ICD-10-CM | POA: Diagnosis not present

## 2023-01-25 DIAGNOSIS — I440 Atrioventricular block, first degree: Secondary | ICD-10-CM | POA: Diagnosis not present

## 2023-01-25 DIAGNOSIS — I252 Old myocardial infarction: Secondary | ICD-10-CM | POA: Diagnosis not present

## 2023-01-25 DIAGNOSIS — Z48812 Encounter for surgical aftercare following surgery on the circulatory system: Secondary | ICD-10-CM | POA: Diagnosis not present

## 2023-01-25 DIAGNOSIS — G459 Transient cerebral ischemic attack, unspecified: Secondary | ICD-10-CM | POA: Diagnosis not present

## 2023-01-25 DIAGNOSIS — Z79899 Other long term (current) drug therapy: Secondary | ICD-10-CM | POA: Diagnosis not present

## 2023-01-25 DIAGNOSIS — K5900 Constipation, unspecified: Secondary | ICD-10-CM | POA: Diagnosis not present

## 2023-01-25 DIAGNOSIS — M6281 Muscle weakness (generalized): Secondary | ICD-10-CM | POA: Diagnosis not present

## 2023-01-25 DIAGNOSIS — Z4682 Encounter for fitting and adjustment of non-vascular catheter: Secondary | ICD-10-CM | POA: Diagnosis not present

## 2023-01-25 DIAGNOSIS — K6389 Other specified diseases of intestine: Secondary | ICD-10-CM | POA: Diagnosis not present

## 2023-01-25 DIAGNOSIS — R269 Unspecified abnormalities of gait and mobility: Secondary | ICD-10-CM | POA: Diagnosis not present

## 2023-01-25 DIAGNOSIS — I129 Hypertensive chronic kidney disease with stage 1 through stage 4 chronic kidney disease, or unspecified chronic kidney disease: Secondary | ICD-10-CM | POA: Diagnosis present

## 2023-01-25 DIAGNOSIS — E1151 Type 2 diabetes mellitus with diabetic peripheral angiopathy without gangrene: Secondary | ICD-10-CM | POA: Diagnosis not present

## 2023-01-25 DIAGNOSIS — I493 Ventricular premature depolarization: Secondary | ICD-10-CM | POA: Diagnosis not present

## 2023-01-25 DIAGNOSIS — J9811 Atelectasis: Secondary | ICD-10-CM | POA: Diagnosis not present

## 2023-01-25 DIAGNOSIS — I480 Paroxysmal atrial fibrillation: Secondary | ICD-10-CM | POA: Diagnosis not present

## 2023-01-25 DIAGNOSIS — Z7401 Bed confinement status: Secondary | ICD-10-CM | POA: Diagnosis not present

## 2023-01-25 DIAGNOSIS — R739 Hyperglycemia, unspecified: Secondary | ICD-10-CM | POA: Diagnosis not present

## 2023-01-25 DIAGNOSIS — N1831 Chronic kidney disease, stage 3a: Secondary | ICD-10-CM | POA: Diagnosis not present

## 2023-01-25 DIAGNOSIS — F515 Nightmare disorder: Secondary | ICD-10-CM

## 2023-01-25 DIAGNOSIS — I6529 Occlusion and stenosis of unspecified carotid artery: Secondary | ICD-10-CM | POA: Diagnosis not present

## 2023-01-25 DIAGNOSIS — E1122 Type 2 diabetes mellitus with diabetic chronic kidney disease: Secondary | ICD-10-CM | POA: Diagnosis present

## 2023-01-25 DIAGNOSIS — I200 Unstable angina: Principal | ICD-10-CM

## 2023-01-25 DIAGNOSIS — Z833 Family history of diabetes mellitus: Secondary | ICD-10-CM

## 2023-01-25 DIAGNOSIS — Z7982 Long term (current) use of aspirin: Secondary | ICD-10-CM

## 2023-01-25 DIAGNOSIS — G4700 Insomnia, unspecified: Secondary | ICD-10-CM | POA: Diagnosis not present

## 2023-01-25 DIAGNOSIS — Z01810 Encounter for preprocedural cardiovascular examination: Secondary | ICD-10-CM | POA: Diagnosis not present

## 2023-01-25 DIAGNOSIS — Z888 Allergy status to other drugs, medicaments and biological substances status: Secondary | ICD-10-CM

## 2023-01-25 DIAGNOSIS — N183 Chronic kidney disease, stage 3 unspecified: Secondary | ICD-10-CM | POA: Diagnosis not present

## 2023-01-25 DIAGNOSIS — I10 Essential (primary) hypertension: Secondary | ICD-10-CM | POA: Diagnosis not present

## 2023-01-25 DIAGNOSIS — Z7902 Long term (current) use of antithrombotics/antiplatelets: Secondary | ICD-10-CM

## 2023-01-25 DIAGNOSIS — Z955 Presence of coronary angioplasty implant and graft: Secondary | ICD-10-CM

## 2023-01-25 DIAGNOSIS — Z794 Long term (current) use of insulin: Secondary | ICD-10-CM | POA: Diagnosis not present

## 2023-01-25 DIAGNOSIS — J449 Chronic obstructive pulmonary disease, unspecified: Secondary | ICD-10-CM | POA: Diagnosis not present

## 2023-01-25 DIAGNOSIS — R2681 Unsteadiness on feet: Secondary | ICD-10-CM | POA: Diagnosis not present

## 2023-01-25 DIAGNOSIS — Z8673 Personal history of transient ischemic attack (TIA), and cerebral infarction without residual deficits: Secondary | ICD-10-CM | POA: Diagnosis not present

## 2023-01-25 DIAGNOSIS — I341 Nonrheumatic mitral (valve) prolapse: Secondary | ICD-10-CM | POA: Diagnosis not present

## 2023-01-25 DIAGNOSIS — R1319 Other dysphagia: Secondary | ICD-10-CM | POA: Diagnosis not present

## 2023-01-25 DIAGNOSIS — I25110 Atherosclerotic heart disease of native coronary artery with unstable angina pectoris: Principal | ICD-10-CM | POA: Diagnosis present

## 2023-01-25 LAB — GLUCOSE, CAPILLARY
Glucose-Capillary: 127 mg/dL — ABNORMAL HIGH (ref 70–99)
Glucose-Capillary: 147 mg/dL — ABNORMAL HIGH (ref 70–99)

## 2023-01-25 LAB — CBC WITH DIFFERENTIAL/PLATELET
Abs Immature Granulocytes: 0.03 10*3/uL (ref 0.00–0.07)
Basophils Absolute: 0 10*3/uL (ref 0.0–0.1)
Basophils Relative: 1 %
Eosinophils Absolute: 0.2 10*3/uL (ref 0.0–0.5)
Eosinophils Relative: 3 %
HCT: 40 % (ref 39.0–52.0)
Hemoglobin: 13.2 g/dL (ref 13.0–17.0)
Immature Granulocytes: 1 %
Lymphocytes Relative: 34 %
Lymphs Abs: 1.9 10*3/uL (ref 0.7–4.0)
MCH: 31.3 pg (ref 26.0–34.0)
MCHC: 33 g/dL (ref 30.0–36.0)
MCV: 94.8 fL (ref 80.0–100.0)
Monocytes Absolute: 0.4 10*3/uL (ref 0.1–1.0)
Monocytes Relative: 7 %
Neutro Abs: 3.1 10*3/uL (ref 1.7–7.7)
Neutrophils Relative %: 54 %
Platelets: 168 10*3/uL (ref 150–400)
RBC: 4.22 MIL/uL (ref 4.22–5.81)
RDW: 12.7 % (ref 11.5–15.5)
WBC: 5.6 10*3/uL (ref 4.0–10.5)
nRBC: 0 % (ref 0.0–0.2)

## 2023-01-25 LAB — COMPREHENSIVE METABOLIC PANEL
ALT: 27 U/L (ref 0–44)
AST: 21 U/L (ref 15–41)
Albumin: 3.7 g/dL (ref 3.5–5.0)
Alkaline Phosphatase: 66 U/L (ref 38–126)
Anion gap: 7 (ref 5–15)
BUN: 16 mg/dL (ref 8–23)
CO2: 25 mmol/L (ref 22–32)
Calcium: 8.7 mg/dL — ABNORMAL LOW (ref 8.9–10.3)
Chloride: 104 mmol/L (ref 98–111)
Creatinine, Ser: 1.54 mg/dL — ABNORMAL HIGH (ref 0.61–1.24)
GFR, Estimated: 46 mL/min — ABNORMAL LOW (ref >60)
Glucose, Bld: 155 mg/dL — ABNORMAL HIGH (ref 70–99)
Potassium: 4.6 mmol/L (ref 3.5–5.1)
Sodium: 136 mmol/L (ref 135–145)
Total Bilirubin: 0.5 mg/dL (ref 0.3–1.2)
Total Protein: 6.7 g/dL (ref 6.5–8.1)

## 2023-01-25 LAB — HEPARIN LEVEL (UNFRACTIONATED): Heparin Unfractionated: 0.29 [IU]/mL — ABNORMAL LOW (ref 0.30–0.70)

## 2023-01-25 LAB — PROTIME-INR
INR: 1 (ref 0.8–1.2)
Prothrombin Time: 13.5 s (ref 11.4–15.2)

## 2023-01-25 LAB — APTT: aPTT: 54 s — ABNORMAL HIGH (ref 24–36)

## 2023-01-25 MED ORDER — TRAZODONE HCL 50 MG PO TABS
50.0000 mg | ORAL_TABLET | Freq: Every evening | ORAL | Status: DC | PRN
  Administered 2023-01-29 – 2023-02-08 (×5): 50 mg via ORAL
  Filled 2023-01-25 (×6): qty 1

## 2023-01-25 MED ORDER — ATORVASTATIN CALCIUM 80 MG PO TABS
80.0000 mg | ORAL_TABLET | Freq: Every day | ORAL | Status: DC
  Administered 2023-01-26 – 2023-02-08 (×13): 80 mg via ORAL
  Filled 2023-01-25 (×15): qty 1

## 2023-01-25 MED ORDER — ALBUTEROL SULFATE (2.5 MG/3ML) 0.083% IN NEBU: 3.0000 mL | INHALATION_SOLUTION | Freq: Four times a day (QID) | RESPIRATORY_TRACT | Status: DC | PRN

## 2023-01-25 MED ORDER — MAGNESIUM HYDROXIDE 400 MG/5ML PO SUSP
30.0000 mL | Freq: Every day | ORAL | Status: DC | PRN
  Administered 2023-01-25: 30 mL via ORAL
  Filled 2023-01-25 (×3): qty 30

## 2023-01-25 MED ORDER — NITROGLYCERIN 0.4 MG SL SUBL: 0.4000 mg | SUBLINGUAL_TABLET | SUBLINGUAL | Status: DC | PRN

## 2023-01-25 MED ORDER — LISINOPRIL 5 MG PO TABS
2.5000 mg | ORAL_TABLET | Freq: Every day | ORAL | Status: AC
  Administered 2023-01-26 – 2023-01-27 (×2): 2.5 mg via ORAL
  Filled 2023-01-25 (×2): qty 1

## 2023-01-25 MED ORDER — ASPIRIN 81 MG PO TBEC
81.0000 mg | DELAYED_RELEASE_TABLET | Freq: Every day | ORAL | Status: DC
  Administered 2023-01-25 – 2023-01-29 (×5): 81 mg via ORAL
  Filled 2023-01-25 (×5): qty 1

## 2023-01-25 MED ORDER — ACETAMINOPHEN 325 MG PO TABS: 650.0000 mg | ORAL_TABLET | ORAL | Status: DC | PRN

## 2023-01-25 MED ORDER — INSULIN GLARGINE-YFGN 100 UNIT/ML ~~LOC~~ SOLN
20.0000 [IU] | Freq: Every day | SUBCUTANEOUS | Status: DC
  Administered 2023-01-26 – 2023-01-29 (×4): 20 [IU] via SUBCUTANEOUS
  Filled 2023-01-25 (×5): qty 0.2

## 2023-01-25 MED ORDER — ONDANSETRON HCL 4 MG/2ML IJ SOLN: 4.0000 mg | Freq: Four times a day (QID) | INTRAMUSCULAR | Status: DC | PRN

## 2023-01-25 MED ORDER — HEPARIN (PORCINE) 25000 UT/250ML-% IV SOLN
950.0000 [IU]/h | INTRAVENOUS | Status: DC
  Administered 2023-01-26: 1050 [IU]/h via INTRAVENOUS
  Administered 2023-01-27 – 2023-01-28 (×2): 950 [IU]/h via INTRAVENOUS
  Filled 2023-01-25 (×4): qty 250

## 2023-01-25 MED ORDER — METOPROLOL SUCCINATE ER 25 MG PO TB24
25.0000 mg | ORAL_TABLET | Freq: Every day | ORAL | Status: DC
  Administered 2023-01-26 – 2023-01-29 (×4): 25 mg via ORAL
  Filled 2023-01-25 (×4): qty 1

## 2023-01-25 MED ORDER — INSULIN ASPART 100 UNIT/ML IJ SOLN
0.0000 [IU] | Freq: Three times a day (TID) | INTRAMUSCULAR | Status: DC
  Administered 2023-01-27: 3 [IU] via SUBCUTANEOUS

## 2023-01-25 MED ORDER — INSULIN DEGLUDEC 100 UNIT/ML ~~LOC~~ SOPN: 20.0000 [IU] | PEN_INJECTOR | Freq: Every day | SUBCUTANEOUS | Status: DC

## 2023-01-25 MED ORDER — INSULIN ASPART 100 UNIT/ML IJ SOLN: 0.0000 [IU] | Freq: Every day | INTRAMUSCULAR | Status: DC

## 2023-01-25 NOTE — H&P (Signed)
to titrate up his antilipid management did titrate for LDL<55 We will reevaluate blood pressures and symptoms and follow-up visit in roughly 2 weeks.  He has been on ACE inhibitor but is on the list but not taking, will need to determine reasons for not being on medications and then likely consider ARB and beta-blocker.   [name], MD  Findings Coronary Findings Diagnostic  Dominance: Right  Left Main Dist LM to Ost LAD lesion is 50% stenosed. The lesion is located at the bifurcation, focal and eccentric. Mildly calcified, hazy  Left Anterior Descending Mid LAD lesion is 85% stenosed with 90% stenosed side branch in 1st Diag. The lesion is located at the bifurcation, focal and eccentric. Mid LAD to Dist LAD lesion is 90% stenosed with 65% stenosed side branch in 2nd Diag. The lesion is located at the bifurcation and segmental.  First Diagonal Branch Vessel is small in size.  First Septal Branch Vessel is small in size.  Second Diagonal Branch Vessel is small in size.  Second Septal Branch Vessel is small in size.  Ramus Intermedius Vessel is large. Ramus lesion is 75% stenosed.  Left Circumflex Vessel is moderate in size. Prox Cx to Mid Cx lesion is 30% stenosed.  First Obtuse Marginal Branch Vessel is small in size. 1st Mrg lesion is 60% stenosed.  Right Coronary Artery Prox RCA lesion is 25% stenosed. Prox RCA to Mid RCA lesion is 80% stenosed. The lesion is located proximal to the major branch and irregular. The lesion is moderately calcified.  Right  Ventricular Branch Vessel is small in size.  Right Posterior Atrioventricular Artery Vessel is large in size.  First Right Posterolateral Branch Vessel is small in size.  Second Right Posterolateral Branch Vessel is large in size.  Intervention  No interventions have been documented.   STRESS TESTS  MYOCARDIAL PERFUSION IMAGING 12/08/2017  Narrative  Nuclear stress EF: 73%.  No T wave inversion was noted during stress.  There was no ST segment deviation noted during stress.  This is a low risk study.  No reversible ischemia. LVEF 73% with normal wall motion. This is a low risk study.   ECHOCARDIOGRAM  ECHOCARDIOGRAM COMPLETE 12/11/2022  Narrative ECHOCARDIOGRAM REPORT    Patient Name:   Jon [name] Date of Exam: 12/11/2022 Medical Rec #:  [id number]           Height:       74.0 in Accession #:    [id number]          Weight:       241.6 lb Date of Birth:  March 20, 1946            BSA:          2.355 m Patient Age:    77 years            BP:           92/69 mmHg Patient Gender: M                   HR:           60 bpm. Exam Location:  [facility]  Procedure: 2D Echo, Cardiac Doppler, Color Doppler and Strain Analysis  Indications:     Acute MI  History:         Patient has prior history of Echocardiogram examinations, most recent 11/28/2021. Acute MI and CAD, TIA and Stroke, Signs/Symptoms:Shortness of Breath; Risk Factors:Hypertension, Diabetes, Dyslipidemia and Current Smoker. CKD.  Sonographer:  to titrate up his antilipid management did titrate for LDL<55 We will reevaluate blood pressures and symptoms and follow-up visit in roughly 2 weeks.  He has been on ACE inhibitor but is on the list but not taking, will need to determine reasons for not being on medications and then likely consider ARB and beta-blocker.   [name], MD  Findings Coronary Findings Diagnostic  Dominance: Right  Left Main Dist LM to Ost LAD lesion is 50% stenosed. The lesion is located at the bifurcation, focal and eccentric. Mildly calcified, hazy  Left Anterior Descending Mid LAD lesion is 85% stenosed with 90% stenosed side branch in 1st Diag. The lesion is located at the bifurcation, focal and eccentric. Mid LAD to Dist LAD lesion is 90% stenosed with 65% stenosed side branch in 2nd Diag. The lesion is located at the bifurcation and segmental.  First Diagonal Branch Vessel is small in size.  First Septal Branch Vessel is small in size.  Second Diagonal Branch Vessel is small in size.  Second Septal Branch Vessel is small in size.  Ramus Intermedius Vessel is large. Ramus lesion is 75% stenosed.  Left Circumflex Vessel is moderate in size. Prox Cx to Mid Cx lesion is 30% stenosed.  First Obtuse Marginal Branch Vessel is small in size. 1st Mrg lesion is 60% stenosed.  Right Coronary Artery Prox RCA lesion is 25% stenosed. Prox RCA to Mid RCA lesion is 80% stenosed. The lesion is located proximal to the major branch and irregular. The lesion is moderately calcified.  Right  Ventricular Branch Vessel is small in size.  Right Posterior Atrioventricular Artery Vessel is large in size.  First Right Posterolateral Branch Vessel is small in size.  Second Right Posterolateral Branch Vessel is large in size.  Intervention  No interventions have been documented.   STRESS TESTS  MYOCARDIAL PERFUSION IMAGING 12/08/2017  Narrative  Nuclear stress EF: 73%.  No T wave inversion was noted during stress.  There was no ST segment deviation noted during stress.  This is a low risk study.  No reversible ischemia. LVEF 73% with normal wall motion. This is a low risk study.   ECHOCARDIOGRAM  ECHOCARDIOGRAM COMPLETE 12/11/2022  Narrative ECHOCARDIOGRAM REPORT    Patient Name:   Jon [name] Date of Exam: 12/11/2022 Medical Rec #:  [id number]           Height:       74.0 in Accession #:    [id number]          Weight:       241.6 lb Date of Birth:  March 20, 1946            BSA:          2.355 m Patient Age:    77 years            BP:           92/69 mmHg Patient Gender: M                   HR:           60 bpm. Exam Location:  [facility]  Procedure: 2D Echo, Cardiac Doppler, Color Doppler and Strain Analysis  Indications:     Acute MI  History:         Patient has prior history of Echocardiogram examinations, most recent 11/28/2021. Acute MI and CAD, TIA and Stroke, Signs/Symptoms:Shortness of Breath; Risk Factors:Hypertension, Diabetes, Dyslipidemia and Current Smoker. CKD.  Sonographer:  to titrate up his antilipid management did titrate for LDL<55 We will reevaluate blood pressures and symptoms and follow-up visit in roughly 2 weeks.  He has been on ACE inhibitor but is on the list but not taking, will need to determine reasons for not being on medications and then likely consider ARB and beta-blocker.   [name], MD  Findings Coronary Findings Diagnostic  Dominance: Right  Left Main Dist LM to Ost LAD lesion is 50% stenosed. The lesion is located at the bifurcation, focal and eccentric. Mildly calcified, hazy  Left Anterior Descending Mid LAD lesion is 85% stenosed with 90% stenosed side branch in 1st Diag. The lesion is located at the bifurcation, focal and eccentric. Mid LAD to Dist LAD lesion is 90% stenosed with 65% stenosed side branch in 2nd Diag. The lesion is located at the bifurcation and segmental.  First Diagonal Branch Vessel is small in size.  First Septal Branch Vessel is small in size.  Second Diagonal Branch Vessel is small in size.  Second Septal Branch Vessel is small in size.  Ramus Intermedius Vessel is large. Ramus lesion is 75% stenosed.  Left Circumflex Vessel is moderate in size. Prox Cx to Mid Cx lesion is 30% stenosed.  First Obtuse Marginal Branch Vessel is small in size. 1st Mrg lesion is 60% stenosed.  Right Coronary Artery Prox RCA lesion is 25% stenosed. Prox RCA to Mid RCA lesion is 80% stenosed. The lesion is located proximal to the major branch and irregular. The lesion is moderately calcified.  Right  Ventricular Branch Vessel is small in size.  Right Posterior Atrioventricular Artery Vessel is large in size.  First Right Posterolateral Branch Vessel is small in size.  Second Right Posterolateral Branch Vessel is large in size.  Intervention  No interventions have been documented.   STRESS TESTS  MYOCARDIAL PERFUSION IMAGING 12/08/2017  Narrative  Nuclear stress EF: 73%.  No T wave inversion was noted during stress.  There was no ST segment deviation noted during stress.  This is a low risk study.  No reversible ischemia. LVEF 73% with normal wall motion. This is a low risk study.   ECHOCARDIOGRAM  ECHOCARDIOGRAM COMPLETE 12/11/2022  Narrative ECHOCARDIOGRAM REPORT    Patient Name:   Jon [name] Date of Exam: 12/11/2022 Medical Rec #:  [id number]           Height:       74.0 in Accession #:    [id number]          Weight:       241.6 lb Date of Birth:  March 20, 1946            BSA:          2.355 m Patient Age:    77 years            BP:           92/69 mmHg Patient Gender: M                   HR:           60 bpm. Exam Location:  [facility]  Procedure: 2D Echo, Cardiac Doppler, Color Doppler and Strain Analysis  Indications:     Acute MI  History:         Patient has prior history of Echocardiogram examinations, most recent 11/28/2021. Acute MI and CAD, TIA and Stroke, Signs/Symptoms:Shortness of Breath; Risk Factors:Hypertension, Diabetes, Dyslipidemia and Current Smoker. CKD.  Sonographer:  to titrate up his antilipid management did titrate for LDL<55 We will reevaluate blood pressures and symptoms and follow-up visit in roughly 2 weeks.  He has been on ACE inhibitor but is on the list but not taking, will need to determine reasons for not being on medications and then likely consider ARB and beta-blocker.   [name], MD  Findings Coronary Findings Diagnostic  Dominance: Right  Left Main Dist LM to Ost LAD lesion is 50% stenosed. The lesion is located at the bifurcation, focal and eccentric. Mildly calcified, hazy  Left Anterior Descending Mid LAD lesion is 85% stenosed with 90% stenosed side branch in 1st Diag. The lesion is located at the bifurcation, focal and eccentric. Mid LAD to Dist LAD lesion is 90% stenosed with 65% stenosed side branch in 2nd Diag. The lesion is located at the bifurcation and segmental.  First Diagonal Branch Vessel is small in size.  First Septal Branch Vessel is small in size.  Second Diagonal Branch Vessel is small in size.  Second Septal Branch Vessel is small in size.  Ramus Intermedius Vessel is large. Ramus lesion is 75% stenosed.  Left Circumflex Vessel is moderate in size. Prox Cx to Mid Cx lesion is 30% stenosed.  First Obtuse Marginal Branch Vessel is small in size. 1st Mrg lesion is 60% stenosed.  Right Coronary Artery Prox RCA lesion is 25% stenosed. Prox RCA to Mid RCA lesion is 80% stenosed. The lesion is located proximal to the major branch and irregular. The lesion is moderately calcified.  Right  Ventricular Branch Vessel is small in size.  Right Posterior Atrioventricular Artery Vessel is large in size.  First Right Posterolateral Branch Vessel is small in size.  Second Right Posterolateral Branch Vessel is large in size.  Intervention  No interventions have been documented.   STRESS TESTS  MYOCARDIAL PERFUSION IMAGING 12/08/2017  Narrative  Nuclear stress EF: 73%.  No T wave inversion was noted during stress.  There was no ST segment deviation noted during stress.  This is a low risk study.  No reversible ischemia. LVEF 73% with normal wall motion. This is a low risk study.   ECHOCARDIOGRAM  ECHOCARDIOGRAM COMPLETE 12/11/2022  Narrative ECHOCARDIOGRAM REPORT    Patient Name:   Jon [name] Date of Exam: 12/11/2022 Medical Rec #:  [id number]           Height:       74.0 in Accession #:    [id number]          Weight:       241.6 lb Date of Birth:  March 20, 1946            BSA:          2.355 m Patient Age:    77 years            BP:           92/69 mmHg Patient Gender: M                   HR:           60 bpm. Exam Location:  [facility]  Procedure: 2D Echo, Cardiac Doppler, Color Doppler and Strain Analysis  Indications:     Acute MI  History:         Patient has prior history of Echocardiogram examinations, most recent 11/28/2021. Acute MI and CAD, TIA and Stroke, Signs/Symptoms:Shortness of Breath; Risk Factors:Hypertension, Diabetes, Dyslipidemia and Current Smoker. CKD.  Sonographer:  Cardiology Admission History and Physical    Patient ID: Jon [name]; MRN: [id number]; DOB: 1945-12-17   Admission date: 01/25/2023  Primary Care Provider: Agapito Games, MD Primary Cardiologist: Dr. Herbie Baltimore  Chief Complaint:  Unstable angina.  Patient Profile:   Jon [name] is a 77 y.o. male  with history of known multivessel CAD with prior recommendation for CABG evaluation with recent inferior ST elevation MI status post PCI/DES to the RCA in 12/2022, left subclavian artery stenosis with associated subclavian steal status post stenting in 12/2021, remote CVA/TIA, DM2, HTN, HLD, prior tobacco use with likely emphysema seen in Transfer from [facility] for bypass consideration.  History of Present Illness:   Mr. Mixell is feeling fine while on heparin.  Mr. Masin, a 77 year old patient with a history of multivessel coronary artery disease, recent inferior ST elevation myocardial infarction (MI) with percutaneous coronary intervention (PCI) to the right coronary artery (RCA), left subclavian stenosis, subclavian steal syndrome, stroke, hypertension, hyperlipidemia, and prior tobacco abuse, presents with worsening shortness of breath and generalized weakness. He was recently admitted to another hospital due to unstable angina; after getting his A1C check with his PCP he had anginal pain with diaphoresis and weakness. He reports feeling the same symptoms that he experienced prior to his MI in August 2024. He also reports episodes of significant chest pain with diaphoresis, which come and go. He has been on various medications, including aspirin, atorvastatin, lisinopril, and Plavix and ticagrelor, but some were held due to concerns about low blood pressure or concerns for Brilinta related shortness of breath. He has a history of prior tobacco abuse but is now smoke-free. His blood pressure has been reasonably well controlled, and he is on atorvastatin for the management of  his cholesterol.  At [facility] health he had no elevation of his high sensitivity troponin.  He was stared on heparin drip.  For the last 24 hours he has been chest pain free.  Notes weakness but denies shortness of breath, DOE .  No PND or orthopnea.  No weight gain, leg swelling , or abdominal swelling.  No syncope or near syncope . Notes  no palpitations or funny heart beats.  He has not been ambulatory since his 9/13 admission.  He was planned for [facility] and transfer for CABG eval.  He noted that  he would like this evaluation at [facility] because of his closeness to Dr. Herbie Baltimore.   Transfer was done.  Presently he has many concerns about needing CABG for severe left main and multi-vessel disease in the setting of DM.  His last plavix was today His last semaglutide was 5 days ago. He was unaware of his CKD (average creatinine 1.5)  Allergies:    Allergies  Allergen Reactions   Gabapentin Other (See Comments)    Nightmares.    Lisinopril Other (See Comments)    Hypotension   Metformin And Related Diarrhea    Social History:   Social History   Socioeconomic History   Marital status: Divorced    Spouse name: Not on file   Number of children: 2   Years of education: 12th grade   Highest education level: High school graduate  Occupational History   Occupation: Retired  Tobacco Use   Smoking status: Former    Current packs/day: 0.00    Average packs/day: 1 pack/day for 62.0 years (62.0 ttl pk-yrs)    Types: Cigarettes    Start date: 01/13/1960  to titrate up his antilipid management did titrate for LDL<55 We will reevaluate blood pressures and symptoms and follow-up visit in roughly 2 weeks.  He has been on ACE inhibitor but is on the list but not taking, will need to determine reasons for not being on medications and then likely consider ARB and beta-blocker.   [name], MD  Findings Coronary Findings Diagnostic  Dominance: Right  Left Main Dist LM to Ost LAD lesion is 50% stenosed. The lesion is located at the bifurcation, focal and eccentric. Mildly calcified, hazy  Left Anterior Descending Mid LAD lesion is 85% stenosed with 90% stenosed side branch in 1st Diag. The lesion is located at the bifurcation, focal and eccentric. Mid LAD to Dist LAD lesion is 90% stenosed with 65% stenosed side branch in 2nd Diag. The lesion is located at the bifurcation and segmental.  First Diagonal Branch Vessel is small in size.  First Septal Branch Vessel is small in size.  Second Diagonal Branch Vessel is small in size.  Second Septal Branch Vessel is small in size.  Ramus Intermedius Vessel is large. Ramus lesion is 75% stenosed.  Left Circumflex Vessel is moderate in size. Prox Cx to Mid Cx lesion is 30% stenosed.  First Obtuse Marginal Branch Vessel is small in size. 1st Mrg lesion is 60% stenosed.  Right Coronary Artery Prox RCA lesion is 25% stenosed. Prox RCA to Mid RCA lesion is 80% stenosed. The lesion is located proximal to the major branch and irregular. The lesion is moderately calcified.  Right  Ventricular Branch Vessel is small in size.  Right Posterior Atrioventricular Artery Vessel is large in size.  First Right Posterolateral Branch Vessel is small in size.  Second Right Posterolateral Branch Vessel is large in size.  Intervention  No interventions have been documented.   STRESS TESTS  MYOCARDIAL PERFUSION IMAGING 12/08/2017  Narrative  Nuclear stress EF: 73%.  No T wave inversion was noted during stress.  There was no ST segment deviation noted during stress.  This is a low risk study.  No reversible ischemia. LVEF 73% with normal wall motion. This is a low risk study.   ECHOCARDIOGRAM  ECHOCARDIOGRAM COMPLETE 12/11/2022  Narrative ECHOCARDIOGRAM REPORT    Patient Name:   Jon [name] Date of Exam: 12/11/2022 Medical Rec #:  [id number]           Height:       74.0 in Accession #:    [id number]          Weight:       241.6 lb Date of Birth:  March 20, 1946            BSA:          2.355 m Patient Age:    77 years            BP:           92/69 mmHg Patient Gender: M                   HR:           60 bpm. Exam Location:  [facility]  Procedure: 2D Echo, Cardiac Doppler, Color Doppler and Strain Analysis  Indications:     Acute MI  History:         Patient has prior history of Echocardiogram examinations, most recent 11/28/2021. Acute MI and CAD, TIA and Stroke, Signs/Symptoms:Shortness of Breath; Risk Factors:Hypertension, Diabetes, Dyslipidemia and Current Smoker. CKD.  Sonographer:  Cardiology Admission History and Physical    Patient ID: Jon [name]; MRN: [id number]; DOB: 1945-12-17   Admission date: 01/25/2023  Primary Care Provider: Agapito Games, MD Primary Cardiologist: Dr. Herbie Baltimore  Chief Complaint:  Unstable angina.  Patient Profile:   Jon [name] is a 77 y.o. male  with history of known multivessel CAD with prior recommendation for CABG evaluation with recent inferior ST elevation MI status post PCI/DES to the RCA in 12/2022, left subclavian artery stenosis with associated subclavian steal status post stenting in 12/2021, remote CVA/TIA, DM2, HTN, HLD, prior tobacco use with likely emphysema seen in Transfer from [facility] for bypass consideration.  History of Present Illness:   Mr. Mixell is feeling fine while on heparin.  Mr. Masin, a 77 year old patient with a history of multivessel coronary artery disease, recent inferior ST elevation myocardial infarction (MI) with percutaneous coronary intervention (PCI) to the right coronary artery (RCA), left subclavian stenosis, subclavian steal syndrome, stroke, hypertension, hyperlipidemia, and prior tobacco abuse, presents with worsening shortness of breath and generalized weakness. He was recently admitted to another hospital due to unstable angina; after getting his A1C check with his PCP he had anginal pain with diaphoresis and weakness. He reports feeling the same symptoms that he experienced prior to his MI in August 2024. He also reports episodes of significant chest pain with diaphoresis, which come and go. He has been on various medications, including aspirin, atorvastatin, lisinopril, and Plavix and ticagrelor, but some were held due to concerns about low blood pressure or concerns for Brilinta related shortness of breath. He has a history of prior tobacco abuse but is now smoke-free. His blood pressure has been reasonably well controlled, and he is on atorvastatin for the management of  his cholesterol.  At [facility] he had no elevation of his high sensitivity troponin.  He was stared on heparin drip.  For the last 24 hours he has been chest pain free.  Notes weakness but denies shortness of breath, DOE .  No PND or orthopnea.  No weight gain, leg swelling , or abdominal swelling.  No syncope or near syncope . Notes  no palpitations or funny heart beats.  He has not been ambulatory since his 9/13 admission.  He was planned for [facility] at [facility] health and transfer for CABG eval.  He noted that  he would like this evaluation at [facility] because of his closeness to Dr. Herbie Baltimore.   Transfer was done.  Presently he has many concerns about needing CABG for severe left main and multi-vessel disease in the setting of DM.  His last plavix was today His last semaglutide was 5 days ago. He was unaware of his CKD (average creatinine 1.5)  Allergies:    Allergies  Allergen Reactions   Gabapentin Other (See Comments)    Nightmares.    Lisinopril Other (See Comments)    Hypotension   Metformin And Related Diarrhea    Social History:   Social History   Socioeconomic History   Marital status: Divorced    Spouse name: Not on file   Number of children: 2   Years of education: 12th grade   Highest education level: High school graduate  Occupational History   Occupation: Retired  Tobacco Use   Smoking status: Former    Current packs/day: 0.00    Average packs/day: 1 pack/day for 62.0 years (62.0 ttl pk-yrs)    Types: Cigarettes    Start date: 01/13/1960  Cardiology Admission History and Physical    Patient ID: Jon [name]; MRN: [id number]; DOB: 1945-12-17   Admission date: 01/25/2023  Primary Care Provider: Agapito Games, MD Primary Cardiologist: Dr. Herbie Baltimore  Chief Complaint:  Unstable angina.  Patient Profile:   Jon [name] is a 77 y.o. male  with history of known multivessel CAD with prior recommendation for CABG evaluation with recent inferior ST elevation MI status post PCI/DES to the RCA in 12/2022, left subclavian artery stenosis with associated subclavian steal status post stenting in 12/2021, remote CVA/TIA, DM2, HTN, HLD, prior tobacco use with likely emphysema seen in Transfer from [facility] for bypass consideration.  History of Present Illness:   Mr. Mixell is feeling fine while on heparin.  Mr. Masin, a 77 year old patient with a history of multivessel coronary artery disease, recent inferior ST elevation myocardial infarction (MI) with percutaneous coronary intervention (PCI) to the right coronary artery (RCA), left subclavian stenosis, subclavian steal syndrome, stroke, hypertension, hyperlipidemia, and prior tobacco abuse, presents with worsening shortness of breath and generalized weakness. He was recently admitted to another hospital due to unstable angina; after getting his A1C check with his PCP he had anginal pain with diaphoresis and weakness. He reports feeling the same symptoms that he experienced prior to his MI in August 2024. He also reports episodes of significant chest pain with diaphoresis, which come and go. He has been on various medications, including aspirin, atorvastatin, lisinopril, and Plavix and ticagrelor, but some were held due to concerns about low blood pressure or concerns for Brilinta related shortness of breath. He has a history of prior tobacco abuse but is now smoke-free. His blood pressure has been reasonably well controlled, and he is on atorvastatin for the management of  his cholesterol.  At [facility] he had no elevation of his high sensitivity troponin.  He was stared on heparin drip.  For the last 24 hours he has been chest pain free.  Notes weakness but denies shortness of breath, DOE .  No PND or orthopnea.  No weight gain, leg swelling , or abdominal swelling.  No syncope or near syncope . Notes  no palpitations or funny heart beats.  He has not been ambulatory since his 9/13 admission.  He was planned for [facility] health and transfer for CABG eval.  He noted that  he would like this evaluation at [facility] because of his closeness to Dr. Herbie Baltimore.   Transfer was done.  Presently he has many concerns about needing CABG for severe left main and multi-vessel disease in the setting of DM.  His last plavix was today His last semaglutide was 5 days ago. He was unaware of his CKD (average creatinine 1.5)  Allergies:    Allergies  Allergen Reactions   Gabapentin Other (See Comments)    Nightmares.    Lisinopril Other (See Comments)    Hypotension   Metformin And Related Diarrhea    Social History:   Social History   Socioeconomic History   Marital status: Divorced    Spouse name: Not on file   Number of children: 2   Years of education: 12th grade   Highest education level: High school graduate  Occupational History   Occupation: Retired  Tobacco Use   Smoking status: Former    Current packs/day: 0.00    Average packs/day: 1 pack/day for 62.0 years (62.0 ttl pk-yrs)    Types: Cigarettes    Start date: 01/13/1959

## 2023-01-25 NOTE — Progress Notes (Addendum)
Cardiologist at bedside. Admission questions deferred at this time.

## 2023-01-25 NOTE — Progress Notes (Signed)
ANTICOAGULATION CONSULT NOTE - Initial Consult  Pharmacy Consult for heparin Indication: chest pain/ACS  Allergies  Allergen Reactions   Gabapentin Other (See Comments)    Nightmares.    Lisinopril Other (See Comments)    Hypotension   Metformin And Related Diarrhea    Patient Measurements: Height: 6\' 1"  (185.4 cm) Weight: 91 kg (200 lb 9.6 oz) IBW/kg (Calculated) : 79.9 Heparin Dosing Weight: 91 kg   Vital Signs: Temp: 98.1 F (36.7 C) (09/15 1706) Temp Source: Oral (09/15 1706) BP: 131/99 (09/15 1706) Pulse Rate: 75 (09/15 1706)  Labs: Recent Labs    01/25/23 1754 01/25/23 1757  HGB 13.2  --   HCT 40.0  --   PLT 168  --   APTT 54*  --   LABPROT 13.5  --   INR 1.0  --   HEPARINUNFRC  --  0.29*  CREATININE 1.54*  --     Estimated Creatinine Clearance: 46.1 mL/min (A) (by C-G formula based on SCr of 1.54 mg/dL (H)).   Medical History: Past Medical History:  Diagnosis Date   BENIGN POSITIONAL VERTIGO 05/20/2010   Qualifier: Diagnosis of  By: Linford Arnold MD, Ronnette Juniper emphysema [facility])    Cerebrovascular disease 07/15/2018   Prior CVA & TIA. Carotid CTA ~50% Bilateral ICA,  Multifocal severe stenosis of the left V2 vertebral artery.  Right vertebral artery patent without significant stenosis.   Coronary artery disease involving native coronary artery with angina pectoris (HCC) 11/18/2017   Severe LM, LAD & RCA stenosis indicated on Coronary CTA => CaCardiac Cath 01/02/2022: dLM ~50%; mLAD 85%@D1  90% (1,1,1) - m-dLAD 90$ @ 65% D2 (0,1,1), RI 75%, pLCx 30%-OM1 60%, pRCA 25% - p-mRCA hazy 85%. Normal LVEF by Echo, EDP 16 mmHg. = Best option CABGrdiac Cath    Current every day smoker    No interest in quitting   Diabetes mellitus, type II, insulin dependent (HCC)    With peripheral neuropathy, peripheral vascular disease and coronary artery disease as complications.   Epididymitis    Hyperlipemia    Hypertension    Stroke (HCC) 11/2009   Subclavian  steal syndrome of left subclavian artery 11/19/2021   Severe stenosis (already seen on CT angiogram (symptoms of left arm claudication, cold left arm, subclavian steal neurologic symptoms. => 12/17/2021: L Subclavia A PTA=Stent with resolution of Sx.   TIA (transient ischemic attack) 11/11/2021   Brief episode of left sided arm pain and slurred speech    Assessment: 77 yoM transferred from [facility] for possible CABG. Patient has ben on heparin for several days at the outside facility and was transported with heparin infusing. Pharmacy has been consulted to manage the heparin infusion. On arrival, the pump was infusing at 860 units/hr and paperwork with morning labs show a therapeutic heparin level 0.39.   Current admission heparin level resulted slightly low at 0.29, CBC stable. Will increase infusion and recheck levels.   Goal of Therapy:  Heparin level 0.3-0.7 units/ml Monitor platelets by anticoagulation protocol: Yes   Plan:  Increase heparin infusion to 950 units/hr Check anti-Xa level in 8 hours and daily while on heparin Continue to monitor H&H and platelets  Ruben Im, PharmD Clinical Pharmacist 01/25/2023 7:01 PM Please check AMION for all [facility] Pharmacy numbers

## 2023-01-26 ENCOUNTER — Encounter: Payer: Medicare Other | Admitting: Physical Therapy

## 2023-01-26 ENCOUNTER — Encounter (HOSPITAL_COMMUNITY): Payer: Self-pay | Admitting: Internal Medicine

## 2023-01-26 DIAGNOSIS — I25110 Atherosclerotic heart disease of native coronary artery with unstable angina pectoris: Secondary | ICD-10-CM | POA: Diagnosis not present

## 2023-01-26 DIAGNOSIS — I200 Unstable angina: Secondary | ICD-10-CM | POA: Diagnosis not present

## 2023-01-26 LAB — GLUCOSE, CAPILLARY
Glucose-Capillary: 104 mg/dL — ABNORMAL HIGH (ref 70–99)
Glucose-Capillary: 109 mg/dL — ABNORMAL HIGH (ref 70–99)
Glucose-Capillary: 113 mg/dL — ABNORMAL HIGH (ref 70–99)
Glucose-Capillary: 128 mg/dL — ABNORMAL HIGH (ref 70–99)

## 2023-01-26 LAB — CBC
HCT: 40 % (ref 39.0–52.0)
Hemoglobin: 13.2 g/dL (ref 13.0–17.0)
MCH: 31.5 pg (ref 26.0–34.0)
MCHC: 33 g/dL (ref 30.0–36.0)
MCV: 95.5 fL (ref 80.0–100.0)
Platelets: 157 10*3/uL (ref 150–400)
RBC: 4.19 MIL/uL — ABNORMAL LOW (ref 4.22–5.81)
RDW: 12.6 % (ref 11.5–15.5)
WBC: 5.8 10*3/uL (ref 4.0–10.5)
nRBC: 0 % (ref 0.0–0.2)

## 2023-01-26 LAB — HEPARIN LEVEL (UNFRACTIONATED)
Heparin Unfractionated: 0.29 [IU]/mL — ABNORMAL LOW (ref 0.30–0.70)
Heparin Unfractionated: 0.61 [IU]/mL (ref 0.30–0.70)
Heparin Unfractionated: 0.64 [IU]/mL (ref 0.30–0.70)

## 2023-01-26 MED ORDER — LACTULOSE 10 GM/15ML PO SOLN
20.0000 g | Freq: Once | ORAL | Status: AC
  Administered 2023-01-26: 20 g via ORAL
  Filled 2023-01-26: qty 30

## 2023-01-26 MED ORDER — BISACODYL 5 MG PO TBEC
5.0000 mg | DELAYED_RELEASE_TABLET | Freq: Every day | ORAL | Status: DC | PRN
  Administered 2023-01-26: 5 mg via ORAL
  Filled 2023-01-26 (×2): qty 1

## 2023-01-26 NOTE — Consult Note (Signed)
[address]       [name] [address]             [phone]      Cardiothoracic Surgery Consultation  Reason for Consult: Left main and severe three-vessel coronary disease with unstable angina Referring Physician: Verne Carrow, MD  Jon [name] is a 77 y.o. male.  HPI:   The patient is a 77 year old gentleman with a history of remote smoking, type 2 diabetes, hyperlipidemia, hypertension, stage III chronic kidney disease, TIA and remote stroke, left subclavian artery stenosis and subclavian steal syndrome status [name] stenting of the proximal left subclavian artery, and coronary disease status [name] PCI of an occluded RCA in the setting of a STEMI on 12/11/2022 by Dr. Darrold Junker.  He underwent catheterization by Dr. Herbie Baltimore on 01/02/2022 showing left main and severe three-vessel coronary disease.  He was referred for CABG evaluation but apparently canceled that appointment and said that he was not going to have surgery.  He was continued on medical therapy and then presented on 12/11/2022 to [facility] with an acute STEMI.  Troponin was greater than 24,000.  Cardiac catheterization showed the culprit to be an occluded right coronary artery.  There is also about 75% mid to distal left main stenosis.  The LAD had 70% proximal and 75% mid stenosis and was diffusely irregular.  There is a large first marginal with 75% stenosis.  The RCA was successfully opened with DES.  Left ventricular ejection fraction was 45 to 50% by visual estimate with mildly elevated LVEDP.  Plans were made for dual antiplatelet therapy for 1 year and continue medical therapy with close follow-up.  He was in seeing his PCP on the day of admission and developed anginal pain with diaphoresis and generalized weakness.  His troponin was negative.  He was started on heparin and remained chest pain-free.  The patient is divorced and lives alone.  He has a son and daughter who both have their  own families and careers.  His daughter is here with him today.  Past Medical History:  Diagnosis Date   BENIGN POSITIONAL VERTIGO 05/20/2010   Qualifier: Diagnosis of  By: Linford Arnold MD, Ronnette Juniper emphysema [facility])    Cerebrovascular disease 07/15/2018   Prior CVA & TIA. Carotid CTA ~50% Bilateral ICA,  Multifocal severe stenosis of the left V2 vertebral artery.  Right vertebral artery patent without significant stenosis.   Coronary artery disease involving native coronary artery with angina pectoris (HCC) 11/18/2017   Severe LM, LAD & RCA stenosis indicated on Coronary CTA => CaCardiac Cath 01/02/2022: dLM ~50%; mLAD 85%@D1  90% (1,1,1) - m-dLAD 90$ @ 65% D2 (0,1,1), RI 75%, pLCx 30%-OM1 60%, pRCA 25% - p-mRCA hazy 85%. Normal LVEF by Echo, EDP 16 mmHg. = Best option CABGrdiac Cath    Current every day smoker    No interest in quitting   Diabetes mellitus, type II, insulin dependent (HCC)    With peripheral neuropathy, peripheral vascular disease and coronary artery disease as complications.   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 => 12/17/2021: L Subclavia A PTA=Stent with resolution of Sx.   TIA (transient ischemic attack) 11/11/2021   Brief episode of left sided arm pain  and slurred speech    Past Surgical History:  Procedure Laterality Date   CORONARY/GRAFT ACUTE MI REVASCULARIZATION N/A 12/11/2022   Procedure: Coronary/Graft Acute MI Revascularization;  Surgeon: Marcina Millard, MD;  Location: [facility] INVASIVE CV LAB;  Service: Cardiovascular;  Laterality: N/A;   LEFT HEART CATH AND CORONARY ANGIOGRAPHY N/A 01/02/2022   Procedure: LEFT HEART CATH AND CORONARY ANGIOGRAPHY;  Surgeon: Marykay Lex, MD;  Location: [facility] INVASIVE CV LAB:: dLM  ~50%; mLAD 85%@D1  90% (1,1,1) - m-dLAD 90$ @ 65% D2 (0,1,1), RI 75%, pLCx 30%-OM1 60%, pRCA 25% - p-mRCA hazy 85%. Normal LVEF by Echo, EDP 16 mmHg. = Best option CABG   LEFT HEART CATH AND CORONARY ANGIOGRAPHY N/A 12/11/2022   Procedure: LEFT HEART CATH AND CORONARY ANGIOGRAPHY;  Surgeon: Marcina Millard, MD;  Location: [facility] INVASIVE CV LAB;  Service: Cardiovascular;  Laterality: N/A;   PILONIDAL CYST EXCISION  1960, 61, 62, 63   TRANSTHORACIC ECHOCARDIOGRAM  11/28/2021   EF 55 to 60%.  No RWMA.  Moderate asymmetric LVH-basal septal.  GR 1 DD.  Normal RV.  Normal MV.  AOV sclerosis without stenosis.  Normal RAP.   UPPER EXTREMITY ANGIOGRAPHY Left 12/17/2021   Procedure: Upper Extremity Angiography;  Surgeon: Renford Dills, MD;  Location: [facility] INVASIVE CV LAB;  Service: Cardiovascular:: High-grade stenosis just distal to the origin of the L-Subclavian A, normal Innom A & L Carotid A => Ost-Prox L SubCl A 8x37x80 Stent - dilated with 9 mm balloon.   VASECTOMY     Zio Patch Monitor  11/2021   Predom Rhtyhm: SR - HR range 47-98 bpm, AVG 64 bpm.  3 runs of NS VT  (> 4 PVC beats): Fastest: 5 beats - HR 125-162 bpm, ~ 145 bpm, 2.1 sec; Longest with Fastest Avg-12 beats, 150-158 bpm, ~ 155 bpm, 4.6 sec.  1  5 beat Atrial Run: HR 125, Avg 120 bpm,   No Sustained Arrhythmias,   Occasional isolated PACs (2.1%) & Rare isolated PVCs (<1%).  No patient triggered events.    Family History  Problem Relation Age of Onset   Diabetes Other        family hx of   Colon cancer Neg Hx     Social History:  reports that he quit smoking about a year ago. His smoking use included cigarettes. He started smoking about 63 years ago. He has a 62 pack-year smoking history. He has never used smokeless tobacco. He reports that he does not drink alcohol and does not use drugs.  Allergies:  Allergies  Allergen Reactions   Gabapentin Other (See Comments)    Nightmares.    Lisinopril Other (See Comments)     Hypotension   Metformin And Related Diarrhea    Medications: I have reviewed the patient's current medications. Prior to Admission:  Medications Prior to Admission  Medication Sig Dispense Refill Last Dose   albuterol (VENTOLIN HFA) 108 (90 Base) MCG/ACT inhaler Inhale 2 puffs into the lungs every 6 (six) hours as needed for wheezing or shortness of breath. 8 g 0 Past Week   aspirin EC 81 MG tablet Take 81 mg by mouth daily. Swallow whole.   01/23/2023   atorvastatin (LIPITOR) 80 MG tablet TAKE 1 TABLET BY MOUTH AT  BEDTIME 100 tablet 1 01/23/2023   clopidogrel (PLAVIX) 75 MG tablet Take 1 tablet (75 mg total) by mouth daily. 90 tablet 3 01/23/2023   Empagliflozin-metFORMIN HCl (SYNJARDY) 12.5-500 MG TABS Take 1 tablet by mouth 2 (two) times  daily. 180 tablet 0 01/23/2023   insulin degludec (TRESIBA FLEXTOUCH) 100 UNIT/ML FlexTouch Pen Inject 20 Units into the skin daily. (Patient taking differently: Inject 20 Units into the skin daily as needed (high blood sugar per patient).) 1 mL 0 Past Week   lisinopril (ZESTRIL) 2.5 MG tablet Take 2.5 mg by mouth daily.   01/23/2023   metoprolol succinate (TOPROL-XL) 25 MG 24 hr tablet Take 1 tablet (25 mg total) by mouth daily. 90 tablet 3 01/23/2023 at 0800   Semaglutide,0.25 or 0.5MG /DOS, 2 MG/3ML SOPN Inject 0.5 mg into the skin every 7 (seven) days.      traZODone (DESYREL) 50 MG tablet TAKE 1 AND 1/2 TABLETS BY MOUTH  AT BEDTIME FOR SLEEP (Patient taking differently: Take 50 mg by mouth at bedtime as needed for sleep.) 135 tablet 3 Past Week   Accu-Chek Softclix Lancets lancets Use to check blood sugars twice daily 300 each 3    Blood Glucose Monitoring Suppl (ACCU-CHEK GUIDE ME) w/Device KIT Check glucose three times daily. Dx. Code E11.8 1 kit 3    glucose blood (ACCU-CHEK GUIDE) test strip Dx DM E11.9. Check fasting blood sugar every morning. 300 each PRN    Scheduled:  aspirin EC  81 mg Oral Daily   atorvastatin  80 mg Oral QHS   insulin aspart   0-20 Units Subcutaneous TID WC   insulin aspart  0-5 Units Subcutaneous QHS   insulin glargine-yfgn  20 Units Subcutaneous Daily   lisinopril  2.5 mg Oral Daily   metoprolol succinate  25 mg Oral Daily   Continuous:  heparin 1,050 Units/hr (01/26/23 0549)   ZOX:[id number], albuterol, magnesium hydroxide, nitroGLYCERIN, ondansetron (ZOFRAN) IV, traZODone Anti-infectives (From admission, onward)    None       Results for orders placed or performed during the hospital encounter of 01/25/23 (from the past 48 hour(s))  Glucose, capillary     Status: Abnormal   Collection Time: 01/25/23  5:08 PM  Result Value Ref Range   Glucose-Capillary 127 (H) 70 - 99 mg/dL    Comment: Glucose reference range applies only to samples taken after fasting for at least 8 hours.  Comprehensive metabolic panel     Status: Abnormal   Collection Time: 01/25/23  5:54 PM  Result Value Ref Range   Sodium 136 135 - 145 mmol/L   Potassium 4.6 3.5 - 5.1 mmol/L   Chloride 104 98 - 111 mmol/L   CO2 25 22 - 32 mmol/L   Glucose, Bld 155 (H) 70 - 99 mg/dL    Comment: Glucose reference range applies only to samples taken after fasting for at least 8 hours.   BUN 16 8 - 23 mg/dL   Creatinine, Ser 1.47 (H) 0.61 - 1.24 mg/dL   Calcium 8.7 (L) 8.9 - 10.3 mg/dL   Total Protein 6.7 6.5 - 8.1 g/dL   Albumin 3.7 3.5 - 5.0 g/dL   AST 21 15 - 41 U/L   ALT 27 0 - 44 U/L   Alkaline Phosphatase 66 38 - 126 U/L   Total Bilirubin 0.5 0.3 - 1.2 mg/dL   GFR, Estimated 46 (L) >60 mL/min    Comment: (NOTE) Calculated using the CKD-EPI Creatinine Equation (2021)    Anion gap 7 5 - 15    Comment: Performed at [facility], [address] [place], [place] [address]  CBC with Differential/Platelet     Status: None   Collection Time: 01/25/23  5:54 PM  Result Value  Ref Range   WBC 5.6 4.0 - 10.5 K/uL   RBC 4.22 4.22 - 5.81 MIL/uL   Hemoglobin 13.2 13.0 - 17.0 g/dL   HCT 91.4 78.2 - 95.6 %   MCV 94.8 80.0 - 100.0 fL    MCH 31.3 26.0 - 34.0 pg   MCHC 33.0 30.0 - 36.0 g/dL   RDW 21.3 08.6 - 57.8 %   Platelets 168 150 - 400 K/uL   nRBC 0.0 0.0 - 0.2 %   Neutrophils Relative % 54 %   Neutro Abs 3.1 1.7 - 7.7 K/uL   Lymphocytes Relative 34 %   Lymphs Abs 1.9 0.7 - 4.0 K/uL   Monocytes Relative 7 %   Monocytes Absolute 0.4 0.1 - 1.0 K/uL   Eosinophils Relative 3 %   Eosinophils Absolute 0.2 0.0 - 0.5 K/uL   Basophils Relative 1 %   Basophils Absolute 0.0 0.0 - 0.1 K/uL   Immature Granulocytes 1 %   Abs Immature Granulocytes 0.03 0.00 - 0.07 K/uL    Comment: Performed at [facility], [address], [place], [place] [address]  Protime-INR     Status: None   Collection Time: 01/25/23  5:54 PM  Result Value Ref Range   Prothrombin Time 13.5 11.4 - 15.2 seconds   INR 1.0 0.8 - 1.2    Comment: (NOTE) INR goal varies based on device and disease states. Performed at [facility], [address], [place], [place] [address]   APTT     Status: Abnormal   Collection Time: 01/25/23  5:54 PM  Result Value Ref Range   aPTT 54 (H) 24 - 36 seconds    Comment:        IF BASELINE aPTT IS ELEVATED, SUGGEST PATIENT RISK ASSESSMENT BE USED TO DETERMINE APPROPRIATE ANTICOAGULANT THERAPY. Performed at [facility], [address], [place], [place] [address]   Heparin level (unfractionated)     Status: Abnormal   Collection Time: 01/25/23  5:57 PM  Result Value Ref Range   Heparin Unfractionated 0.29 (L) 0.30 - 0.70 IU/mL    Comment: (NOTE) The clinical reportable range upper limit is being lowered to >1.10 to align with the FDA approved guidance for the current laboratory assay.  If heparin results are below expected values, and patient dosage has  been confirmed, suggest follow up testing of antithrombin III levels. Performed at [facility], [address], [place], [place] [address]   Glucose, capillary     Status: Abnormal   Collection Time: 01/25/23  8:57 PM  Result Value  Ref Range   Glucose-Capillary 147 (H) 70 - 99 mg/dL    Comment: Glucose reference range applies only to samples taken after fasting for at least 8 hours.   Comment 1 Notify RN    Comment 2 Document in Chart   Heparin level (unfractionated)     Status: Abnormal   Collection Time: 01/26/23  3:13 AM  Result Value Ref Range   Heparin Unfractionated 0.29 (L) 0.30 - 0.70 IU/mL    Comment: (NOTE) The clinical reportable range upper limit is being lowered to >1.10 to align with the FDA approved guidance for the current laboratory assay.  If heparin results are below expected values, and patient dosage has  been confirmed, suggest follow up testing of antithrombin III levels. Performed at [facility], [address] [place], [place] [address]   CBC     Status: Abnormal  Collection Time: 01/26/23  3:13 AM  Result Value Ref Range   WBC 5.8 4.0 - 10.5 K/uL   RBC 4.19 (L) 4.22 - 5.81 MIL/uL   Hemoglobin 13.2 13.0 - 17.0 g/dL   HCT 19.1 47.8 - 29.5 %   MCV 95.5 80.0 - 100.0 fL   MCH 31.5 26.0 - 34.0 pg   MCHC 33.0 30.0 - 36.0 g/dL   RDW 62.1 30.8 - 65.7 %   Platelets 157 150 - 400 K/uL   nRBC 0.0 0.0 - 0.2 %    Comment: Performed at [facility], [address] [place], [place] [address]  Glucose, capillary     Status: Abnormal   Collection Time: 01/26/23  6:06 AM  Result Value Ref Range   Glucose-Capillary 104 (H) 70 - 99 mg/dL    Comment: Glucose reference range applies only to samples taken after fasting for at least 8 hours.   Comment 1 Notify RN    Comment 2 Document in Chart     No results found.  Review of Systems  Constitutional:  Positive for diaphoresis and fatigue. Negative for activity change.  HENT: Negative.    Eyes: Negative.   Respiratory:  Positive for chest tightness, shortness of breath and wheezing.   Cardiovascular:  Positive for chest pain. Negative for palpitations and leg swelling.  Gastrointestinal: Negative.   Endocrine: Negative.    Genitourinary: Negative.   Musculoskeletal: Negative.   Skin: Negative.   Allergic/Immunologic: Negative.   Neurological:  Negative for dizziness and syncope.  Hematological: Negative.   Psychiatric/Behavioral: Negative.     Blood pressure 139/78, pulse 70, temperature 98 F (36.7 C), temperature source Oral, resp. rate 13, height 6\' 1"  (1.854 m), weight 90.9 kg, SpO2 99%. Physical Exam Constitutional:      Appearance: Normal appearance. He is normal weight.  HENT:     Head: Normocephalic and atraumatic.  Eyes:     Extraocular Movements: Extraocular movements intact.     Pupils: Pupils are equal, round, and reactive to light.  Neck:     Vascular: No carotid bruit.  Cardiovascular:     Rate and Rhythm: Normal rate and regular rhythm.     Pulses: Normal pulses.     Heart sounds: Normal heart sounds. No murmur heard. Pulmonary:     Effort: Pulmonary effort is normal.     Breath sounds: Normal breath sounds.  Abdominal:     General: There is no distension.     Tenderness: There is no abdominal tenderness.  Musculoskeletal:        General: No swelling.  Skin:    General: Skin is warm and dry.  Neurological:     General: No focal deficit present.     Mental Status: He is alert and oriented to person, place, and time.  Psychiatric:        Mood and Affect: Mood normal.        Behavior: Behavior normal.   Physicians  Panel Physicians Referring Physician Case Authorizing Physician  Paraschos, Alexander, MD (Primary)     Procedures  Coronary/Graft Acute MI Revascularization  LEFT HEART CATH AND CORONARY ANGIOGRAPHY   Conclusion      1st Mrg lesion is 75% stenosed.   Prox LAD lesion is 70% stenosed.   Mid LAD lesion is 75% stenosed.   Mid LM to Dist LM lesion is 75% stenosed.   Prox RCA lesion is 100% stenosed.   A drug-eluting stent was successfully placed using a STENT  ONYX FRONTIER 2.5X22.   [name] intervention, there is a 0% residual stenosis.   There is mild left  ventricular systolic dysfunction.   LV end diastolic pressure is mildly elevated.   The left ventricular ejection fraction is 45-50% by visual estimate.   1.  Inferior STEMI 2.  Three-vessel coronary artery disease with 75% stenosis distal left main, 70% stenosis proximal LAD, 75% stenosis mid LAD, 95% stenosis OM1, 100% stenosis proximal RCA (culprit vessel) 3.  Mildly reduced left ventricular function with estimated LV ejection fraction 45 to 50% 4.  Successful primary PCI with 2.5 x 22 mm Onyx frontier drug-eluting stent proximal RCA   Recommendations   1.  Dual antiplatelet therapy uninterrupted x 1 year 2.  Start metoprolol succinate 25 mg daily 3.  2D echocardiogram 4.  Start ACE/ARB prior to discharge 5.  Cardiac rehabilitation after discharge 6.  Close cardiology follow-up after discharge   Procedural Details  Technical Details The right wrist was prepped and draped in usual sterile manner.  Anesthesia was obtained 1% lidocaine locally.  Hydrophilic 6 French sheath was introduced to right radial artery.  Coronary angiography of the left coronary artery was performed with a JL 4 diagnostic catheter.  The right coronary artery was cannulated with a JR4 guide catheter.  Proximal RCA occlusion was crossed with a Prowater wire.  Dilatation was performed with a 2.25 x 15 mm trek balloon dilatation catheter.  A 2.5 x 22 mm Onyx frontier stent was deployed.  Postdilatation was performed with a 2.75 x 20 mm Sharp Euphora balloon dilatation catheter.  Left ventriculography was performed with a pigtail catheter.  Hemostasis was achieved with a TR band.  Patient received 5000 units of heparin.  He received 180 mg of Brilinta.  He received 2 mg of Versed and 2 mcg of fentanyl.  Total contrast was 100 cc.  There were no periprocedural complications. Estimated blood loss <50 mL.   During this procedure medications were administered to achieve and maintain moderate conscious sedation while the patient's  heart rate, blood pressure, and oxygen saturation were continuously monitored and I was present face-to-face 100% of this time.   Medications (Filter: Administrations occurring from 0210 to 0350 on 12/11/22)  important  Continuous medications are totaled by the amount administered until 12/11/22 0350.   midazolam (VERSED) injection (mg)  Total dose: 2 mg Date/Time Rate/Dose/Volume Action   12/11/22 0226 0.5 mg Given   0249 0.5 mg Given   0306 0.5 mg Given   0319 0.5 mg Given   fentaNYL (SUBLIMAZE) injection (mcg)  Total dose: 50 mcg Date/Time Rate/Dose/Volume Action   12/11/22 0226 12.5 mcg Given   0249 12.5 mcg Given   0306 12.5 mcg Given   0319 12.5 mcg Given   Heparin (Porcine) in NaCl 1000-0.9 UT/500ML-% SOLN (mL)  Total volume: 1,000 mL Date/Time Rate/Dose/Volume Action   12/11/22 0226 1,000 mL Given   verapamil (ISOPTIN) injection (mg)  Total dose: 5 mg Date/Time Rate/Dose/Volume Action   12/11/22 0228 2.5 mg Given   0323 2.5 mg Given   heparin sodium (porcine) injection (Units)  Total dose: 12,000 Units Date/Time Rate/Dose/Volume Action   12/11/22 0231 4,000 Units Given   0243 8,000 Units Given   lidocaine (PF) (XYLOCAINE) 1 % injection (mL)  Total volume: 2 mL Date/Time Rate/Dose/Volume Action   12/11/22 0231 2 mL Given   ticagrelor (BRILINTA) tablet (mg)  Total dose: 180 mg Date/Time Rate/Dose/Volume Action   12/11/22 0306 180 mg Given   nitroGLYCERIN  1 mg/10 mL (100 mcg/mL) - IR/CATH LAB (mcg)  Total dose: 200 mcg Date/Time Rate/Dose/Volume Action   12/11/22 0309 200 mcg Given   sodium chloride 0.9 % bolus (mL)  Total volume: 250 mL Date/Time Rate/Dose/Volume Action   12/11/22 0312 250 mL New Bag/Given   iohexol (OMNIPAQUE) 300 MG/ML solution (mL)  Total volume: 300 mL Date/Time Rate/Dose/Volume Action   12/11/22 0343 300 mL Given    Sedation Time  Sedation Time Physician-1: 1 hour 7 minutes 48 seconds Contrast     Administrations occurring from  0210 to 0350 on 12/11/22:  Medication Name Total Dose  iohexol (OMNIPAQUE) 300 MG/ML solution 300 mL   Radiation/Fluoro  Fluoro time: 31.1 (min) DAP: 105 (Gycm2) Cumulative Air Kerma: 1481 (mGy) Complications  Complications documented before study signed (12/11/2022  4:00 AM)   No complications were associated with this study.  Documented by Lanette Hampshire, RN - 12/11/2022  3:41 AM     Coronary Findings  Diagnostic Dominance: Right Left Main  Mid LM to Dist LM lesion is 75% stenosed. The lesion is tubular.    Left Anterior Descending  Prox LAD lesion is 70% stenosed. The lesion is tubular.  Mid LAD lesion is 75% stenosed. The lesion is tubular.    Left Circumflex    First Obtuse Marginal Branch  1st Mrg lesion is 75% stenosed. The lesion is tubular.    Right Coronary Artery  Prox RCA lesion is 100% stenosed.    Intervention   Prox RCA lesion  Stent  Lesion crossed with guidewire using a WIRE ASAHI PROWATER 180CM. Pre-stent angioplasty was performed using a BALLN TREK RX [id number]. A drug-eluting stent was successfully placed using a STENT ONYX FRONTIER 2.5X22. Stent strut is well apposed. Stent does not overlap previously placed stentPost-stent angioplasty was performed using a BALLN Brilliant EUPHORA RX 2.75X20.  [name]-Intervention Lesion Assessment  The intervention was successful. Pre-interventional TIMI flow is 0. [name]-intervention TIMI flow is 3. No complications occurred at this lesion.  There is a 0% residual stenosis [name] intervention.     Wall Motion     The following segments are hypokinetic: mid inferior and apical inferior.           Left Heart  Left Ventricle The left ventricular size is normal. There is mild left ventricular systolic dysfunction. LV end diastolic pressure is mildly elevated. The left ventricular ejection fraction is 45-50% by visual estimate. There are LV function abnormalities.   Coronary Diagrams  Diagnostic Dominance:  Right  Intervention    Implants   Permanent Stent  Stent Onyx Frontier 2.5x22 - [id number] - Implanted  Inventory item: Gilda Crease 0.9W11 Model/Cat number: [id number]  Manufacturer: [employer] Lot number: [id number]  Device identifier: [id number] Device identifier type: GS1  Area Of Implantation: Mid RCA    GUDID Information  Request status Successful    Brand name: Onyx FrontierT Version/Model: [id number]  Company name: [employer] MRI safety info as of 12/11/22: MR Conditional  Contains dry or latex rubber: No    GMDN P.T. name: Drug-eluting coronary artery stent, non-bioabsorbable-polymer-coated    As of 12/11/2022  Status: Implanted       Syngo Images   Show images for CARDIAC CATHETERIZATION Images on Long Term Storage   Show images for Jon [name], Jon "Donald" Link to Procedure Log  Procedure Log   Link to Procedure Log  Procedure Log    Hemo Data  Flowsheet Row Most Recent Value  AO Systolic Pressure 127  mmHg  AO Diastolic Pressure 67 mmHg  AO Mean 93 mmHg  LV Systolic Pressure 124 mmHg  LV Diastolic Pressure 9 mmHg  LV EDP 22 mmHg  AOp Systolic Pressure 85 mmHg  AOp Diastolic Pressure 37 mmHg  AOp Mean Pressure 52 mmHg  LVp Systolic Pressure 118 mmHg  LVp Diastolic Pressure 50 mmHg  LVp EDP Pressure 21 mmHg     ECHOCARDIOGRAM REPORT       Patient Name:   RAHEEL MILOVICH Date of Exam: 12/11/2022 Medical Rec #:  [id number]           Height:       74.0 in Accession #:    [id number]          Weight:       241.6 lb Date of Birth:  [birth year]            BSA:          2.355 m Patient Age:    77 years            BP:           92/69 mmHg Patient Gender: M                   HR:           60 bpm. Exam Location:  [facility]  Procedure: 2D Echo, Cardiac Doppler, Color Doppler and Strain Analysis  Indications:     Acute MI   History:         Patient has prior history of Echocardiogram examinations, most                   recent 11/28/2021. Acute MI and CAD, TIA and Stroke,                  Signs/Symptoms:Shortness of Breath; Risk Factors:Hypertension,                  Diabetes, Dyslipidemia and Current Smoker. CKD.   Sonographer:     Mikki Harbor Referring Phys:  [id number] LILY MICHELLE TANG Diagnosing Phys: Marcina Millard MD    Sonographer Comments: Global longitudinal strain was attempted. IMPRESSIONS    1. Left ventricular ejection fraction, by estimation, is 50 to 55%. The left ventricle has low normal function. The left ventricle demonstrates regional wall motion abnormalities (see scoring diagram/findings for description). Left ventricular diastolic  parameters are consistent with Grade I diastolic dysfunction (impaired relaxation).  2. Right ventricular systolic function is normal. The right ventricular size is normal.  3. The mitral valve is normal in structure. Mild mitral valve regurgitation. No evidence of mitral stenosis.  4. The aortic valve is normal in structure. Aortic valve regurgitation is not visualized. No aortic stenosis is present.  5. The inferior vena cava is normal in size with greater than 50% respiratory variability, suggesting right atrial pressure of 3 mmHg.  FINDINGS  Left Ventricle: Left ventricular ejection fraction, by estimation, is 50 to 55%. The left ventricle has low normal function. The left ventricle demonstrates regional wall motion abnormalities. The left ventricular internal cavity size was normal in size. There is no left ventricular hypertrophy. Left ventricular diastolic parameters are consistent with Grade I diastolic dysfunction (impaired relaxation).    LV Wall Scoring: The mid and distal inferior wall is hypokinetic.  Right Ventricle: The right ventricular size is normal. No increase in right ventricular wall thickness. Right ventricular systolic function is normal.  Left Atrium: Left atrial size was  normal in size.  Right  Atrium: Right atrial size was normal in size.  Pericardium: There is no evidence of pericardial effusion.  Mitral Valve: The mitral valve is normal in structure. Mild mitral valve regurgitation. No evidence of mitral valve stenosis. MV peak gradient, 5.3 mmHg. The mean mitral valve gradient is 1.0 mmHg.  Tricuspid Valve: The tricuspid valve is normal in structure. Tricuspid valve regurgitation is mild . No evidence of tricuspid stenosis.  Aortic Valve: The aortic valve is normal in structure. Aortic valve regurgitation is not visualized. No aortic stenosis is present. Aortic valve mean gradient measures 2.0 mmHg. Aortic valve peak gradient measures 4.1 mmHg. Aortic valve area, by VTI measures 2.96 cm.  Pulmonic Valve: The pulmonic valve was normal in structure. Pulmonic valve regurgitation is not visualized. No evidence of pulmonic stenosis.  Aorta: The aortic root is normal in size and structure.  Venous: The inferior vena cava is normal in size with greater than 50% respiratory variability, suggesting right atrial pressure of 3 mmHg.  IAS/Shunts: No atrial level shunt detected by color flow Doppler.    LEFT VENTRICLE PLAX 2D LVIDd:         5.00 cm LVIDs:         3.40 cm LV PW:         1.40 cm LV IVS:        1.40 cm LVOT diam:     1.90 cm LV SV:         69 LV SV Index:   29 LVOT Area:     2.84 cm   LV Volumes (MOD) LV vol d, MOD A2C: 42.2 ml LV vol d, MOD A4C: 67.2 ml LV vol s, MOD A2C: 22.7 ml LV vol s, MOD A4C: 29.7 ml LV SV MOD A2C:     19.5 ml LV SV MOD A4C:     67.2 ml LV SV MOD BP:      28.1 ml  RIGHT VENTRICLE RV Basal diam:  3.60 cm RV Mid diam:    3.20 cm RV S prime:     13.60 cm/s TAPSE (M-mode): 2.9 cm  LEFT ATRIUM             Index        RIGHT ATRIUM           Index LA diam:        3.80 cm 1.61 cm/m   RA Area:     14.90 cm LA Vol (A2C):   52.3 ml 22.21 ml/m  RA Volume:   33.30 ml  14.14 ml/m LA Vol (A4C):   48.3 ml 20.51 ml/m LA Biplane  Vol: 50.6 ml 21.48 ml/m  AORTIC VALVE                    PULMONIC VALVE AV Area (Vmax):    3.03 cm     PV Vmax:       0.86 m/s AV Area (Vmean):   2.72 cm     PV Peak grad:  3.0 mmHg AV Area (VTI):     2.96 cm AV Vmax:           101.00 cm/s AV Vmean:          68.900 cm/s AV VTI:            0.233 m AV Peak Grad:      4.1 mmHg AV Mean Grad:      2.0 mmHg LVOT Vmax:  108.00 cm/s LVOT Vmean:        66.000 cm/s LVOT VTI:          0.243 m LVOT/AV VTI ratio: 1.04   AORTA Ao Root diam: 3.80 cm Ao Asc diam:  3.70 cm  MITRAL VALVE MV Area (PHT): 3.17 cm    SHUNTS MV Area VTI:   1.97 cm    Systemic VTI:  0.24 m MV Peak grad:  5.3 mmHg    Systemic Diam: 1.90 cm MV Mean grad:  1.0 mmHg MV Vmax:       1.15 m/s MV Vmean:      51.2 cm/s MV Decel Time: 239 msec MV E velocity: 78.10 cm/s MV A velocity: 83.80 cm/s MV E/A ratio:  0.93  Marcina Millard MD Electronically signed by Marcina Millard MD Signature Date/Time: 12/11/2022/4:22:14 PM       Final     Assessment/Plan:  This 77 year old gentleman has left main and severe three-vessel coronary disease presenting with unstable anginal symptoms after recent inferior STEMI treated with PCI in August.  I agree that coronary artery bypass graft surgery is the best treatment to prevent further ischemia and infarction and improve his quality of life.  His operative risk is somewhat elevated due to comorbid risk factors including suboptimally controlled diabetes and stage III chronic kidney disease.  He has been on aspirin and Plavix and his Plavix has been discontinued in anticipation of needing bypass surgery. I discussed the operative procedure with the patient and his daughter including alternatives, benefits and risks; including but not limited to bleeding, blood transfusion, infection, stroke, myocardial infarction, graft failure, heart block requiring a permanent pacemaker, organ dysfunction, and death.  Jon [name]  understands and agrees to proceed.  We will schedule surgery for Friday this week.  Payton Doughty Brenlyn Beshara 01/26/2023, 11:03 AM

## 2023-01-26 NOTE — Progress Notes (Signed)
ANTICOAGULATION CONSULT NOTE - Follow Up  Pharmacy Consult for heparin Indication: chest pain/ACS  Allergies  Allergen Reactions   Gabapentin Other (See Comments)    Nightmares.    Lisinopril Other (See Comments)    Hypotension   Metformin And Related Diarrhea    Patient Measurements: Height: 6\' 1"  (185.4 cm) Weight: 90.9 kg (200 lb 6.4 oz) IBW/kg (Calculated) : 79.9 Heparin Dosing Weight: 91 kg   Vital Signs: Temp: 98 F (36.7 C) (09/16 1110) Temp Source: Oral (09/16 1110) BP: 127/72 (09/16 1110) Pulse Rate: 68 (09/16 1110)  Labs: Recent Labs    01/25/23 1754 01/25/23 1757 01/26/23 0313 01/26/23 1343  HGB 13.2  --  13.2  --   HCT 40.0  --  40.0  --   PLT 168  --  157  --   APTT 54*  --   --   --   LABPROT 13.5  --   --   --   INR 1.0  --   --   --   HEPARINUNFRC  --  0.29* 0.29* 0.61  CREATININE 1.54*  --   --   --     Estimated Creatinine Clearance: 46.1 mL/min (A) (by C-G formula based on SCr of 1.54 mg/dL (H)).   Medical History: Past Medical History:  Diagnosis Date   BENIGN POSITIONAL VERTIGO 05/20/2010   Qualifier: Diagnosis of  By: Linford Arnold MD, Ronnette Juniper emphysema [facility])    Cerebrovascular disease 07/15/2018   Prior CVA & TIA. Carotid CTA ~50% Bilateral ICA,  Multifocal severe stenosis of the left V2 vertebral artery.  Right vertebral artery patent without significant stenosis.   Coronary artery disease involving native coronary artery with angina pectoris (HCC) 11/18/2017   Severe LM, LAD & RCA stenosis indicated on Coronary CTA => CaCardiac Cath 01/02/2022: dLM ~50%; mLAD 85%@D1  90% (1,1,1) - m-dLAD 90$ @ 65% D2 (0,1,1), RI 75%, pLCx 30%-OM1 60%, pRCA 25% - p-mRCA hazy 85%. Normal LVEF by Echo, EDP 16 mmHg. = Best option CABGrdiac Cath    Current every day smoker    No interest in quitting   Diabetes mellitus, type II, insulin dependent (HCC)    With peripheral neuropathy, peripheral vascular disease and coronary artery disease as  complications.   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 => 12/17/2021: L Subclavia A PTA=Stent with resolution of Sx.   TIA (transient ischemic attack) 11/11/2021   Brief episode of left sided arm pain and slurred speech    Assessment: 77 yoM transferred from [facility] for possible CABG. Patient has ben on heparin for several days at the outside facility and was transported with heparin infusing. Pharmacy has been consulted to manage the heparin infusion. On arrival, the pump was infusing at 860 units/hr and paperwork with morning labs show a therapeutic heparin level 0.39.   Current admission heparin level resulted slightly low at 0.29, CBC stable. Will increase infusion and recheck levels.   9/16 PM: HL 0.61- therapeutic No signs of bleeding or issues with heparin gtt per nursing  Goal of Therapy:  Heparin level 0.3-0.7 units/ml Monitor platelets by anticoagulation protocol: Yes   Plan:  Continue heparin infusion to 1050 units/hr Check a HL (confirmatory level) in 8 hours and daily while  on heparin Continue to monitor H&H and platelets  Anjelica Gorniak BS, PharmD, BCPS Clinical Pharmacist 01/26/2023 4:03 PM  Contact: [phone] after 3 PM  "Be curious, not judgmental..." -Debbora Dus

## 2023-01-26 NOTE — Progress Notes (Signed)
ANTICOAGULATION CONSULT NOTE - Follow Up  Pharmacy Consult for heparin Indication: chest pain/ACS  Allergies  Allergen Reactions   Gabapentin Other (See Comments)    Nightmares.    Lisinopril Other (See Comments)    Hypotension   Metformin And Related Diarrhea    Patient Measurements: Height: 6\' 1"  (185.4 cm) Weight: 90.9 kg (200 lb 6.4 oz) IBW/kg (Calculated) : 79.9 Heparin Dosing Weight: 91 kg   Vital Signs: Temp: 97.9 F (36.6 C) (09/16 0352) Temp Source: Oral (09/16 0352) BP: 101/53 (09/16 0352) Pulse Rate: 64 (09/16 0352)  Labs: Recent Labs    01/25/23 1754 01/25/23 1757 01/26/23 0313  HGB 13.2  --  13.2  HCT 40.0  --  40.0  PLT 168  --  157  APTT 54*  --   --   LABPROT 13.5  --   --   INR 1.0  --   --   HEPARINUNFRC  --  0.29* 0.29*  CREATININE 1.54*  --   --     Estimated Creatinine Clearance: 46.1 mL/min (A) (by C-G formula based on SCr of 1.54 mg/dL (H)).   Medical History: Past Medical History:  Diagnosis Date   BENIGN POSITIONAL VERTIGO 05/20/2010   Qualifier: Diagnosis of  By: Linford Arnold MD, Ronnette Juniper emphysema [facility])    Cerebrovascular disease 07/15/2018   Prior CVA & TIA. Carotid CTA ~50% Bilateral ICA,  Multifocal severe stenosis of the left V2 vertebral artery.  Right vertebral artery patent without significant stenosis.   Coronary artery disease involving native coronary artery with angina pectoris (HCC) 11/18/2017   Severe LM, LAD & RCA stenosis indicated on Coronary CTA => CaCardiac Cath 01/02/2022: dLM ~50%; mLAD 85%@D1  90% (1,1,1) - m-dLAD 90$ @ 65% D2 (0,1,1), RI 75%, pLCx 30%-OM1 60%, pRCA 25% - p-mRCA hazy 85%. Normal LVEF by Echo, EDP 16 mmHg. = Best option CABGrdiac Cath    Current every day smoker    No interest in quitting   Diabetes mellitus, type II, insulin dependent (HCC)    With peripheral neuropathy, peripheral vascular disease and coronary artery disease as complications.   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 => 12/17/2021: L Subclavia A PTA=Stent with resolution of Sx.   TIA (transient ischemic attack) 11/11/2021   Brief episode of left sided arm pain and slurred speech    Assessment: 77 yoM transferred from [facility] for possible CABG. Patient has ben on heparin for several days at the outside facility and was transported with heparin infusing. Pharmacy has been consulted to manage the heparin infusion. On arrival, the pump was infusing at 860 units/hr and paperwork with morning labs show a therapeutic heparin level 0.39.   Current admission heparin level resulted slightly low at 0.29, CBC stable. Will increase infusion and recheck levels.   9/16 AM: heparin level returned at 0.29 on 950 units/hr (subtherapeutic). Per RN, no issues with heparin or signs/symptoms of bleeding. CBC stable  Goal of Therapy:  Heparin level 0.3-0.7 units/ml Monitor platelets by anticoagulation protocol: Yes   Plan:  Increase heparin infusion to 1050 units/hr Check anti-Xa level in 8 hours and daily while on heparin Continue to monitor H&H and platelets  Arabella Merles, PharmD. Clinical Pharmacist 01/26/2023 5:04 AM

## 2023-01-26 NOTE — Plan of Care (Signed)
  Problem: Activity: ?Goal: Risk for activity intolerance will decrease ?Outcome: Progressing ?  ?Problem: Pain Managment: ?Goal: General experience of comfort will improve ?Outcome: Progressing ?  ?Problem: Safety: ?Goal: Ability to remain free from injury will improve ?Outcome: Progressing ?  ?

## 2023-01-26 NOTE — Progress Notes (Addendum)
Rounding Note    Patient Name: Jon [name] Date of Encounter: 01/26/2023  [facility] Cardiologist: [name], MD   Subjective   No chest pain or dyspnea this am. He has many questions.   Inpatient Medications    Scheduled Meds:  aspirin EC  81 mg Oral Daily   atorvastatin  80 mg Oral QHS   insulin aspart  0-20 Units Subcutaneous TID WC   insulin aspart  0-5 Units Subcutaneous QHS   insulin glargine-yfgn  20 Units Subcutaneous Daily   lisinopril  2.5 mg Oral Daily   metoprolol succinate  25 mg Oral Daily   Continuous Infusions:  heparin 1,050 Units/hr (01/26/23 0549)   PRN Meds: acetaminophen, albuterol, magnesium hydroxide, nitroGLYCERIN, ondansetron (ZOFRAN) IV, traZODone   Vital Signs    Vitals:   01/25/23 1706 01/25/23 2014 01/25/23 2337 01/26/23 0352  BP: (!) 131/99 115/81 104/62 (!) 101/53  Pulse: 75 71 62 64  Resp: 17 17 19 19   Temp: 98.1 F (36.7 C) 98.2 F (36.8 C) 97.9 F (36.6 C) 97.9 F (36.6 C)  TempSrc: Oral Oral Oral Oral  SpO2:  99% 97% 100%  Weight: 91 kg   90.9 kg  Height: 6\' 1"  (1.854 m)       Intake/Output Summary (Last 24 hours) at 01/26/2023 0833 Last data filed at 01/26/2023 0356 Gross per 24 hour  Intake 261.53 ml  Output 200 ml  Net 61.53 ml      01/26/2023    3:52 AM 01/25/2023    5:06 PM 01/19/2023   10:23 AM  Last 3 Weights  Weight (lbs) 200 lb 6.4 oz 200 lb 9.6 oz 199 lb 6.4 oz  Weight (kg) 90.901 kg 90.992 kg 90.447 kg      Telemetry    Sinus - Personally Reviewed  ECG    No tracing this am - Personally Reviewed  Physical Exam   GEN: No acute distress.   Neck: No JVD Cardiac: RRR, no murmurs, rubs, or gallops.  Respiratory: Clear to auscultation bilaterally. GI: Soft, nontender, non-distended  MS: No edema; No deformity. Neuro:  Nonfocal  Psych: Normal affect   Labs    High Sensitivity Troponin:  No results for input(s): "TROPONINIHS" in the last 720 hours.   Chemistry Recent Labs   Lab 01/25/23 1754  NA 136  K 4.6  CL 104  CO2 25  GLUCOSE 155*  BUN 16  CREATININE 1.54*  CALCIUM 8.7*  PROT 6.7  ALBUMIN 3.7  AST 21  ALT 27  ALKPHOS 66  BILITOT 0.5  GFRNONAA 46*  ANIONGAP 7    Lipids No results for input(s): "CHOL", "TRIG", "HDL", "LABVLDL", "LDLCALC", "CHOLHDL" in the last 168 hours.  Hematology Recent Labs  Lab 01/25/23 1754 01/26/23 0313  WBC 5.6 5.8  RBC 4.22 4.19*  HGB 13.2 13.2  HCT 40.0 40.0  MCV 94.8 95.5  MCH 31.3 31.5  MCHC 33.0 33.0  RDW 12.7 12.6  PLT 168 157   Thyroid No results for input(s): "TSH", "FREET4" in the last 168 hours.  BNPNo results for input(s): "BNP", "PROBNP" in the last 168 hours.  DDimer No results for input(s): "DDIMER" in the last 168 hours.   Radiology    No results found.  Cardiac Studies     Patient Profile     77 y.o. male with history of known multivessel CAD with prior recommendation for CABG evaluation with recent inferior ST elevation MI status post PCI/DES to the RCA in  12/2022, left subclavian artery stenosis with associated subclavian steal status post stenting in 12/2021, remote CVA/TIA, DM2, HTN, HLD, prior tobacco use with likely emphysema transferred to [facility] from [facility] for further cardiac workup. Pt admitted there with diaphoresis, weakness and worsening dyspnea.   Assessment & Plan    Multi-vessel CAD with unstable angina: Pt had cardiac cath in August 2024 in the setting of an acute inferior MI and had stenting of the RCA at that time. Also with severe left main stenosis and severe disease in the LAD and Circumflex/OM. Admitted at [facility] with unstable angina. Troponin negative. Pt transferred to [facility] for evaluation for possible CABG. He has no chest pain this am. Will plan to continue ASA, statin and metoprolol. Plavix on hold for washout for potential surgery. Continue IV heparin since Plavix is being held. If he is not felt to be a candidate for surgery then would proceed with medical  management of his CAD. I do not think stenting of the distal left main lesion is a viable option. He is not sure that he will proceed with surgery even if it is offered to him. EF 45-50% at [facility]. HTN: BP is well controlled.  CKD: Renal function at baseline Prior CVA DM: SSI HLD: continue statin  For questions or updates, please contact [facility] Please consult [email or web address] for contact info under        Signed, Verne Carrow, MD  01/26/2023, 8:33 AM

## 2023-01-26 NOTE — Plan of Care (Signed)
  Problem: Education: Goal: Knowledge of General Education information will improve Description: Including pain rating scale, medication(s)/side effects and non-pharmacologic comfort measures Outcome: Progressing   Problem: Coping: Goal: Level of anxiety will decrease Outcome: Progressing   Problem: Safety: Goal: Ability to remain free from injury will improve Outcome: Progressing   Problem: Elimination: Goal: Will not experience complications related to bowel motility Outcome: Not Progressing  Pt. States he hasn't had BM. Meds given by previous RN.

## 2023-01-26 NOTE — Progress Notes (Signed)
ANTICOAGULATION CONSULT NOTE - Follow Up  Pharmacy Consult for heparin Indication: chest pain/ACS  Allergies  Allergen Reactions   Gabapentin Other (See Comments)    Nightmares.    Lisinopril Other (See Comments)    Hypotension   Metformin And Related Diarrhea    Patient Measurements: Height: 6\' 1"  (185.4 cm) Weight: 90.9 kg (200 lb 6.4 oz) IBW/kg (Calculated) : 79.9 Heparin Dosing Weight: 91 kg   Vital Signs: Temp: 98.7 F (37.1 C) (09/16 2120) Temp Source: Oral (09/16 2120) BP: 100/60 (09/16 2120) Pulse Rate: 65 (09/16 2120)  Labs: Recent Labs    01/25/23 1754 01/25/23 1757 01/26/23 0313 01/26/23 1343 01/26/23 2154  HGB 13.2  --  13.2  --   --   HCT 40.0  --  40.0  --   --   PLT 168  --  157  --   --   APTT 54*  --   --   --   --   LABPROT 13.5  --   --   --   --   INR 1.0  --   --   --   --   HEPARINUNFRC  --    < > 0.29* 0.61 0.64  CREATININE 1.54*  --   --   --   --    < > = values in this interval not displayed.    Estimated Creatinine Clearance: 46.1 mL/min (A) (by C-G formula based on SCr of 1.54 mg/dL (H)).   Medical History: Past Medical History:  Diagnosis Date   BENIGN POSITIONAL VERTIGO 05/20/2010   Qualifier: Diagnosis of  By: Linford Arnold MD, Ronnette Juniper emphysema [facility])    Cerebrovascular disease 07/15/2018   Prior CVA & TIA. Carotid CTA ~50% Bilateral ICA,  Multifocal severe stenosis of the left V2 vertebral artery.  Right vertebral artery patent without significant stenosis.   Coronary artery disease involving native coronary artery with angina pectoris (HCC) 11/18/2017   Severe LM, LAD & RCA stenosis indicated on Coronary CTA => CaCardiac Cath 01/02/2022: dLM ~50%; mLAD 85%@D1  90% (1,1,1) - m-dLAD 90$ @ 65% D2 (0,1,1), RI 75%, pLCx 30%-OM1 60%, pRCA 25% - p-mRCA hazy 85%. Normal LVEF by Echo, EDP 16 mmHg. = Best option CABGrdiac Cath    Current every day smoker    No interest in quitting   Diabetes mellitus, type II, insulin  dependent (HCC)    With peripheral neuropathy, peripheral vascular disease and coronary artery disease as complications.   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 => 12/17/2021: L Subclavia A PTA=Stent with resolution of Sx.   TIA (transient ischemic attack) 11/11/2021   Brief episode of left sided arm pain and slurred speech    Assessment: 77 yoM transferred from [facility] [place] for possible CABG. Patient has ben on heparin for several days at the outside facility and was transported with heparin infusing. Pharmacy has been consulted to manage the heparin infusion. On arrival, the pump was infusing at 860 units/hr and paperwork with morning labs show a therapeutic heparin level 0.39.   Current admission heparin level resulted slightly low at 0.29, CBC stable. Will increase infusion and recheck levels.   9/16 PM: HL 0.61>>0.64- therapeutic No signs of bleeding or issues with heparin gtt  per nursing Last CBC stable  Goal of Therapy:  Heparin level 0.3-0.7 units/ml Monitor platelets by anticoagulation protocol: Yes   Plan:  Continue heparin infusion to 1050 units/hr Check a heparin level daily Continue to monitor H&H and platelets  Arabella Merles, PharmD. Clinical Pharmacist 01/26/2023 11:17 PM

## 2023-01-27 ENCOUNTER — Institutional Professional Consult (permissible substitution): Payer: Medicare Other | Admitting: Student in an Organized Health Care Education/Training Program

## 2023-01-27 ENCOUNTER — Encounter: Payer: Medicare Other | Admitting: Surgery

## 2023-01-27 ENCOUNTER — Inpatient Hospital Stay (HOSPITAL_COMMUNITY): Payer: Medicare Other

## 2023-01-27 DIAGNOSIS — I200 Unstable angina: Secondary | ICD-10-CM | POA: Diagnosis not present

## 2023-01-27 DIAGNOSIS — Z01810 Encounter for preprocedural cardiovascular examination: Secondary | ICD-10-CM

## 2023-01-27 LAB — CBC
HCT: 38.9 % — ABNORMAL LOW (ref 39.0–52.0)
Hemoglobin: 12.7 g/dL — ABNORMAL LOW (ref 13.0–17.0)
MCH: 31.3 pg (ref 26.0–34.0)
MCHC: 32.6 g/dL (ref 30.0–36.0)
MCV: 95.8 fL (ref 80.0–100.0)
Platelets: 159 10*3/uL (ref 150–400)
RBC: 4.06 MIL/uL — ABNORMAL LOW (ref 4.22–5.81)
RDW: 12.9 % (ref 11.5–15.5)
WBC: 6.1 10*3/uL (ref 4.0–10.5)
nRBC: 0 % (ref 0.0–0.2)

## 2023-01-27 LAB — BASIC METABOLIC PANEL
Anion gap: 6 (ref 5–15)
BUN: 18 mg/dL (ref 8–23)
CO2: 24 mmol/L (ref 22–32)
Calcium: 8.8 mg/dL — ABNORMAL LOW (ref 8.9–10.3)
Chloride: 105 mmol/L (ref 98–111)
Creatinine, Ser: 1.46 mg/dL — ABNORMAL HIGH (ref 0.61–1.24)
GFR, Estimated: 50 mL/min — ABNORMAL LOW (ref >60)
Glucose, Bld: 106 mg/dL — ABNORMAL HIGH (ref 70–99)
Potassium: 4.5 mmol/L (ref 3.5–5.1)
Sodium: 135 mmol/L (ref 135–145)

## 2023-01-27 LAB — GLUCOSE, CAPILLARY
Glucose-Capillary: 100 mg/dL — ABNORMAL HIGH (ref 70–99)
Glucose-Capillary: 143 mg/dL — ABNORMAL HIGH (ref 70–99)
Glucose-Capillary: 106 mg/dL — ABNORMAL HIGH (ref 70–99)
Glucose-Capillary: 93 mg/dL (ref 70–99)

## 2023-01-27 LAB — HEPARIN LEVEL (UNFRACTIONATED)
Heparin Unfractionated: 0.78 [IU]/mL — ABNORMAL HIGH (ref 0.30–0.70)
Heparin Unfractionated: 0.39 [IU]/mL (ref 0.30–0.70)

## 2023-01-27 LAB — VAS US DOPPLER PRE CABG
Left ABI: 0.88
Right ABI: 0.91

## 2023-01-27 MED ORDER — LACTULOSE 10 GM/15ML PO SOLN
30.0000 g | Freq: Two times a day (BID) | ORAL | Status: DC | PRN
  Administered 2023-01-27 – 2023-01-29 (×4): 30 g via ORAL
  Filled 2023-01-27 (×4): qty 45

## 2023-01-27 MED ORDER — LACTULOSE ENCEPHALOPATHY 10 GM/15ML PO SOLN: 30.0000 g | Freq: Two times a day (BID) | ORAL | Status: DC | PRN

## 2023-01-27 NOTE — TOC Initial Note (Signed)
Transition of Care [facility]) - Initial/Assessment Note    Patient Details  Name: Jon [name] MRN: [id number] Date of Birth: 08/31/1945  Transition of Care [facility]) CM/SW Contact:    Leone Haven, RN Phone Number: 01/27/2023, 4:30 PM  Clinical Narrative:                 From home alone, has PCP and insurance on file, states has no HH services in place at this time or DME at home.  States son or daughter will transport him home at [employer] and family is support system, states gets medications from [facility] in [place] on [address]  Pta self ambulatory.   Expected Discharge Plan: Home/Self Care Barriers to Discharge: Continued Medical Work up   Patient Goals and CMS Choice Patient states their goals for this hospitalization and ongoing recovery are:: return home   Choice offered to / list presented to : NA      Expected Discharge Plan and Services In-house Referral: NA Discharge Planning Services: CM Consult Post Acute Care Choice: NA Living arrangements for the past 2 months: Single Family Home                 DME Arranged: N/A DME Agency: NA       HH Arranged: NA          Prior Living Arrangements/Services Living arrangements for the past 2 months: Single Family Home Lives with:: Self Patient language and need for interpreter reviewed:: Yes Do you feel safe going back to the place where you live?: Yes      Need for Family Participation in Patient Care: Yes (Comment) Care giver support system in place?: Yes (comment)   Criminal Activity/Legal Involvement Pertinent to Current Situation/Hospitalization: No - Comment as needed  Activities of Daily Living Home Assistive Devices/Equipment: None ADL Screening (condition at time of admission) Patient's cognitive ability adequate to safely complete daily activities?: Yes Is the patient deaf or have difficulty hearing?: No Does the patient have difficulty seeing, even when wearing glasses/contacts?: No Does the  patient have difficulty concentrating, remembering, or making decisions?: No Patient able to express need for assistance with ADLs?: Yes Does the patient have difficulty dressing or bathing?: No Independently performs ADLs?: Yes (appropriate for developmental age) Does the patient have difficulty walking or climbing stairs?: Yes Weakness of Legs: Both Weakness of Arms/Hands: None  Permission Sought/Granted Permission sought to share information with : Case Manager Permission granted to share information with : Yes, Verbal Permission Granted              Emotional Assessment Appearance:: Appears stated age Attitude/Demeanor/Rapport: Engaged Affect (typically observed): Appropriate Orientation: : Oriented to Self, Oriented to Place, Oriented to  Time, Oriented to Situation   Psych Involvement: No (comment)  Admission diagnosis:  Unstable angina (HCC) [I20.0] Patient Active Problem List   Diagnosis Date Noted   Unstable angina (HCC) 01/25/2023   Weakness generalized 12/12/2022   STEMI involving oth coronary artery of inferior wall (HCC) 12/11/2022   STEMI involving right coronary artery (HCC) 12/11/2022   Nightmares 08/04/2022   Senile purpura (HCC) 01/17/2022   Abnormal CT scan of heart    Carotid stenosis 12/01/2021   Precordial pain 11/16/2021   TIA (transient ischemic attack) 11/16/2021   Postural dizziness with near syncope 03/08/2020   Subclavian arterial stenosis (HCC) 12/07/2019   Abnormal TSH 12/07/2019   Insomnia 10/01/2018   Pulmonary nodule 07/15/2018   Cerebrovascular disease 07/15/2018   Continuous dependence  on cigarette smoking 12/02/2017   History of stroke 12/02/2017   Shortness of breath on exertion 12/02/2017   Coronary artery disease involving native coronary artery with angina pectoris (HCC) 11/18/2017   Centrilobular emphysema (HCC) 11/03/2017   H/O parotidectomy 06/02/2017   Parotid mass 05/07/2017   Monoplegia of lower extremity following  cerebral infarction affecting right dominant side (HCC) 07/30/2016   CKD stage 3 due to type 2 diabetes mellitus (HCC) 03/22/2015   Screening for prostate cancer 07/22/2011   Diabetic peripheral neuropathy (HCC) 06/05/2011   ROTATOR CUFF SYNDROME, RIGHT 03/22/2010   History of cardiovascular disorder 02/28/2010   Diabetes mellitus, type II, insulin dependent (HCC) 01/31/2010   Hyperlipidemia due to type 2 diabetes mellitus (HCC) 01/31/2010   TOBACCO ABUSE 01/31/2010   Essential hypertension 01/31/2010   PCP:  Agapito Games, MD Pharmacy:   [facility]) - [place], [place] - [address] [facility] [address] [place] [address] [place] [address] Phone: [phone] Fax: [phone]  [facility] [address]), [place] - [address] [place]) [place] [address] Phone: [phone] Fax: [phone]  [facility] Delivery - [place], [place] - [address] [place] [address] Phone: [phone] Fax: [phone]  [facility] [address] [place] [address] Phone: [phone] Fax: [phone]  [facility] [address] [place] [address] Phone: [phone] Fax: [phone]     Social Determinants of Health (SDOH) Social History: SDOH Screenings   Food Insecurity: No Food Insecurity (01/25/2023)  Housing: Low Risk  (01/25/2023)  Transportation Needs: No Transportation Needs (01/25/2023)  Utilities: Not At Risk (01/25/2023)  Alcohol Screen: Low Risk  (08/01/2022)  Depression (PHQ2-9): Low Risk  (12/04/2022)  Financial Resource Strain: Low Risk  (08/01/2022)  Physical Activity: Inactive (08/01/2022)  Social Connections: Unknown (01/23/2023)   Received from [facility]  Stress: No Stress Concern Present (01/23/2023)   Received from [facility]  Tobacco Use: Medium  Risk (01/26/2023)   SDOH Interventions:     Readmission Risk Interventions     No data to display

## 2023-01-27 NOTE — Progress Notes (Signed)
Rounding Note    Patient Name: Jon [name] Date of Encounter: 01/27/2023  [facility] Cardiologist: [name], MD   Subjective   No chest pain or dyspnea.   Inpatient Medications    Scheduled Meds:  aspirin EC  81 mg Oral Daily   atorvastatin  80 mg Oral QHS   insulin aspart  0-20 Units Subcutaneous TID WC   insulin aspart  0-5 Units Subcutaneous QHS   insulin glargine-yfgn  20 Units Subcutaneous Daily   lisinopril  2.5 mg Oral Daily   metoprolol succinate  25 mg Oral Daily   Continuous Infusions:  heparin 950 Units/hr (01/27/23 0618)   PRN Meds: acetaminophen, albuterol, bisacodyl, magnesium hydroxide, nitroGLYCERIN, ondansetron (ZOFRAN) IV, traZODone   Vital Signs    Vitals:   01/26/23 2120 01/26/23 2349 01/27/23 0524 01/27/23 0811  BP: 100/60 (!) 94/45 (!) 99/55 95/66  Pulse: 65 62 66 60  Resp: 20 18 18 17   Temp: 98.7 F (37.1 C) 98.2 F (36.8 C) 98.2 F (36.8 C) 98.2 F (36.8 C)  TempSrc: Oral Oral Oral Oral  SpO2: 96% 98% 100% 100%  Weight:   90.8 kg   Height:        Intake/Output Summary (Last 24 hours) at 01/27/2023 1000 Last data filed at 01/27/2023 0630 Gross per 24 hour  Intake 784.76 ml  Output 900 ml  Net -115.24 ml      01/27/2023    5:24 AM 01/26/2023    3:52 AM 01/25/2023    5:06 PM  Last 3 Weights  Weight (lbs) 200 lb 1.6 oz 200 lb 6.4 oz 200 lb 9.6 oz  Weight (kg) 90.765 kg 90.901 kg 90.992 kg      Telemetry    Sinus - Personally Reviewed  ECG    No morning tracing - Personally Reviewed  Physical Exam   GEN: No acute distress.   Neck: No JVD Cardiac: RRR, no murmurs, rubs, or gallops.  Respiratory: Clear to auscultation bilaterally. GI: Soft, nontender, non-distended  MS: No edema; No deformity. Neuro:  Nonfocal  Psych: Normal affect   Labs    High Sensitivity Troponin:  No results for input(s): "TROPONINIHS" in the last 720 hours.   Chemistry Recent Labs  Lab 01/25/23 1754 01/27/23 0302   NA 136 135  K 4.6 4.5  CL 104 105  CO2 25 24  GLUCOSE 155* 106*  BUN 16 18  CREATININE 1.54* 1.46*  CALCIUM 8.7* 8.8*  PROT 6.7  --   ALBUMIN 3.7  --   AST 21  --   ALT 27  --   ALKPHOS 66  --   BILITOT 0.5  --   GFRNONAA 46* 50*  ANIONGAP 7 6    Lipids No results for input(s): "CHOL", "TRIG", "HDL", "LABVLDL", "LDLCALC", "CHOLHDL" in the last 168 hours.  Hematology Recent Labs  Lab 01/25/23 1754 01/26/23 0313 01/27/23 0302  WBC 5.6 5.8 6.1  RBC 4.22 4.19* 4.06*  HGB 13.2 13.2 12.7*  HCT 40.0 40.0 38.9*  MCV 94.8 95.5 95.8  MCH 31.3 31.5 31.3  MCHC 33.0 33.0 32.6  RDW 12.7 12.6 12.9  PLT 168 157 159   Thyroid No results for input(s): "TSH", "FREET4" in the last 168 hours.  BNPNo results for input(s): "BNP", "PROBNP" in the last 168 hours.  DDimer No results for input(s): "DDIMER" in the last 168 hours.   Radiology    No results found.  Cardiac Studies     Patient Profile  77 y.o. male with history of known multivessel CAD with prior recommendation for CABG evaluation with recent inferior ST elevation MI status post PCI/DES to the RCA in 12/2022, left subclavian artery stenosis with associated subclavian steal status post stenting in 12/2021, remote CVA/TIA, DM2, HTN, HLD, prior tobacco use with likely emphysema transferred to [facility] from [facility] for further cardiac workup. Pt admitted there with diaphoresis, weakness and worsening dyspnea.   Assessment & Plan    CAD/Unstable Angina: He underwent cardiac cath 12/2022 in the setting of acute inferior MI with stenting of the RCA. Was also noted to have severe left main stenosis and severe LAD/Lcx and OM disease. Presented to [facility] with unstable angina and transferred to [facility] for CABG elevation. Seen by Dr. Laneta Simmers with plans for CABG on 01/30/23. Plavix washout this week. Continue IV heparin, ASA, statin, metoprolol. Received lisinopril (will hold after today's dose with up-coming CABG)  2. HTN: Controlled. No changes  today.   3. HLD: Continue statin  4. CKD: baseline Cr around 1.5, stable.   5. DM: on SSI   For questions or updates, please contact [facility] Please consult [email or web address] for contact info under        Signed, Verne Carrow, MD, [facility] 01/27/2023 10:32 AM

## 2023-01-27 NOTE — Progress Notes (Signed)
ANTICOAGULATION CONSULT NOTE - Follow Up  Pharmacy Consult for heparin Indication: chest pain/ACS  Allergies  Allergen Reactions   Gabapentin Other (See Comments)    Nightmares.    Lisinopril Other (See Comments)    Hypotension   Metformin And Related Diarrhea    Patient Measurements: Height: 6\' 1"  (185.4 cm) Weight: 90.8 kg (200 lb 1.6 oz) IBW/kg (Calculated) : 79.9 Heparin Dosing Weight: 91 kg   Vital Signs: Temp: 98.2 F (36.8 C) (09/17 1125) Temp Source: Oral (09/17 1125) BP: 112/67 (09/17 1125) Pulse Rate: 67 (09/17 1125)  Labs: Recent Labs    01/25/23 1754 01/25/23 1757 01/26/23 0313 01/26/23 1343 01/26/23 2154 01/27/23 0302 01/27/23 1210  HGB 13.2  --  13.2  --   --  12.7*  --   HCT 40.0  --  40.0  --   --  38.9*  --   PLT 168  --  157  --   --  159  --   APTT 54*  --   --   --   --   --   --   LABPROT 13.5  --   --   --   --   --   --   INR 1.0  --   --   --   --   --   --   HEPARINUNFRC  --    < > 0.29*   < > 0.64 0.78* 0.39  CREATININE 1.54*  --   --   --   --  1.46*  --    < > = values in this interval not displayed.    Estimated Creatinine Clearance: 48.6 mL/min (A) (by C-G formula based on SCr of 1.46 mg/dL (H)).   Medical History: Past Medical History:  Diagnosis Date   BENIGN POSITIONAL VERTIGO 05/20/2010   Qualifier: Diagnosis of  By: Linford Arnold MD, Ronnette Juniper emphysema [facility])    Cerebrovascular disease 07/15/2018   Prior CVA & TIA. Carotid CTA ~50% Bilateral ICA,  Multifocal severe stenosis of the left V2 vertebral artery.  Right vertebral artery patent without significant stenosis.   Coronary artery disease involving native coronary artery with angina pectoris (HCC) 11/18/2017   Severe LM, LAD & RCA stenosis indicated on Coronary CTA => CaCardiac Cath 01/02/2022: dLM ~50%; mLAD 85%@D1  90% (1,1,1) - m-dLAD 90$ @ 65% D2 (0,1,1), RI 75%, pLCx 30%-OM1 60%, pRCA 25% - p-mRCA hazy 85%. Normal LVEF by Echo, EDP 16 mmHg. = Best  option CABGrdiac Cath    Current every day smoker    No interest in quitting   Diabetes mellitus, type II, insulin dependent (HCC)    With peripheral neuropathy, peripheral vascular disease and coronary artery disease as complications.   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 => 12/17/2021: L Subclavia A PTA=Stent with resolution of Sx.   TIA (transient ischemic attack) 11/11/2021   Brief episode of left sided arm pain and slurred speech    Assessment:  77 yoM [facility]. Patient has ben on heparin for several days at the outside facility and was transported with heparin infusing. Pharmacy has been consulted to manage the heparin infusion. CABG planned for Friday  -heparin level= 0.29 on 950 units/hr  Goal of Therapy:  Heparin level 0.3-0.7 units/ml Monitor platelets by anticoagulation protocol: Yes   Plan:  -Continue heparin at 950 units/hr -Daily heparin level and CBC  Harland German, PharmD Clinical Pharmacist **Pharmacist phone directory can now be found on [email or web address] ([email or web address]).  Listed under [facility].

## 2023-01-27 NOTE — Plan of Care (Signed)

## 2023-01-27 NOTE — Consult Note (Signed)
Value-Based Care Institute  [facility] Inpatient Consult   01/27/2023  Jon [name] 1946/03/20 [id number]  [facility] [[facility]]  Accountable Care Organization [ACO] Patient: [name] Medicare  Primary Care Provider: Agapito Games, MD with [facility], Kathryne Sharper is listed to provide the transition of care follow up.  Insurance: [employer]    The patient was screened for hospitalization with noted medium risk score for unplanned readmission risk with 2 IP/2ED in 6 months.  The patient was assessed for potential [facility]) Care Management service needs for post hospital transition for care coordination. Review of patient's electronic medical record reveals patient is from home alone. Patient with ongoing follow up for intervention and medical workup as per electronic medical record.  Plan: [facility] Liaison will continue to follow progress and disposition to asess for post hospital community care coordination/management needs.  Referral request for community care coordination: pending disposition.    [facility] Care Management/Population Health does not replace or interfere with any arrangements made by the Inpatient Transition of Care team.   For questions contact:   Charlesetta Shanks, RN, BSN, CCM [place]  [facility], [facility] Liaison Direct Dial: [phone] or secure chat Website: [email or web address]

## 2023-01-27 NOTE — Progress Notes (Signed)
Pre-CABG testing has been completed. Preliminary results can be found in CV Proc through chart review.   01/27/23 2:19 PM Olen Cordial RVT

## 2023-01-27 NOTE — Plan of Care (Signed)
Problem: Education: Goal: Knowledge of General Education information will improve Description: Including pain rating scale, medication(s)/side effects and non-pharmacologic comfort measures Outcome: Progressing   Problem: Clinical Measurements: Goal: Cardiovascular complication will be avoided Outcome: Progressing   Problem: Activity: Goal: Risk for activity intolerance will decrease Outcome: Progressing

## 2023-01-28 ENCOUNTER — Encounter: Payer: Medicare Other | Admitting: Physical Therapy

## 2023-01-28 DIAGNOSIS — I200 Unstable angina: Secondary | ICD-10-CM | POA: Diagnosis not present

## 2023-01-28 LAB — GLUCOSE, CAPILLARY
Glucose-Capillary: 97 mg/dL (ref 70–99)
Glucose-Capillary: 115 mg/dL — ABNORMAL HIGH (ref 70–99)
Glucose-Capillary: 102 mg/dL — ABNORMAL HIGH (ref 70–99)
Glucose-Capillary: 140 mg/dL — ABNORMAL HIGH (ref 70–99)

## 2023-01-28 MED ORDER — INSULIN ASPART 100 UNIT/ML IJ SOLN
0.0000 [IU] | Freq: Three times a day (TID) | INTRAMUSCULAR | Status: DC
  Administered 2023-01-30: 1 [IU] via SUBCUTANEOUS

## 2023-01-28 NOTE — Progress Notes (Signed)
Rounding Note    Patient Name: Jon [name] Date of Encounter: 01/28/2023  [facility] Cardiologist: [name], MD   Subjective   No chest pain  Inpatient Medications    Scheduled Meds:  aspirin EC  81 mg Oral Daily   atorvastatin  80 mg Oral QHS   insulin aspart  0-20 Units Subcutaneous TID WC   insulin aspart  0-5 Units Subcutaneous QHS   insulin glargine-yfgn  20 Units Subcutaneous Daily   metoprolol succinate  25 mg Oral Daily   Continuous Infusions:  heparin 950 Units/hr (01/27/23 1821)   PRN Meds: acetaminophen, albuterol, bisacodyl, lactulose, magnesium hydroxide, nitroGLYCERIN, ondansetron (ZOFRAN) IV, traZODone   Vital Signs    Vitals:   01/27/23 1900 01/27/23 2353 01/28/23 0426 01/28/23 0747  BP: 131/70 (!) 93/49  125/71  Pulse: 65 61 70 68  Resp:  19  15  Temp: 97.7 F (36.5 C) 97.8 F (36.6 C) 98.1 F (36.7 C) 98.1 F (36.7 C)  TempSrc: Oral Oral Oral Oral  SpO2:    95%  Weight:   90.5 kg   Height:        Intake/Output Summary (Last 24 hours) at 01/28/2023 0926 Last data filed at 01/28/2023 0429 Gross per 24 hour  Intake 1116.75 ml  Output 1200 ml  Net -83.25 ml      01/28/2023    4:26 AM 01/27/2023    5:24 AM 01/26/2023    3:52 AM  Last 3 Weights  Weight (lbs) 199 lb 8.3 oz 200 lb 1.6 oz 200 lb 6.4 oz  Weight (kg) 90.5 kg 90.765 kg 90.901 kg      Telemetry    Sinus - Personally Reviewed  ECG    No morning tracing - Personally Reviewed  Physical Exam   General: Well developed, well nourished, NAD  HEENT: OP clear, mucus membranes moist  SKIN: warm, dry. No rashes. Neuro: No focal deficits  Musculoskeletal: Muscle strength 5/5 all ext  Psychiatric: Mood and affect normal  Neck: No JVD Lungs:Clear bilaterally, no wheezes, rhonci, crackles Cardiovascular: Regular rate and rhythm. No murmurs, gallops or rubs. Abdomen:Soft.  Extremities: No lower extremity edema.   Labs    High Sensitivity Troponin:  No  results for input(s): "TROPONINIHS" in the last 720 hours.   Chemistry Recent Labs  Lab 01/25/23 1754 01/27/23 0302  NA 136 135  K 4.6 4.5  CL 104 105  CO2 25 24  GLUCOSE 155* 106*  BUN 16 18  CREATININE 1.54* 1.46*  CALCIUM 8.7* 8.8*  PROT 6.7  --   ALBUMIN 3.7  --   AST 21  --   ALT 27  --   ALKPHOS 66  --   BILITOT 0.5  --   GFRNONAA 46* 50*  ANIONGAP 7 6    Lipids No results for input(s): "CHOL", "TRIG", "HDL", "LABVLDL", "LDLCALC", "CHOLHDL" in the last 168 hours.  Hematology Recent Labs  Lab 01/26/23 0313 01/27/23 0302 01/28/23 0444  WBC 5.8 6.1 5.3  RBC 4.19* 4.06* 4.11*  HGB 13.2 12.7* 13.4  HCT 40.0 38.9* 39.6  MCV 95.5 95.8 96.4  MCH 31.5 31.3 32.6  MCHC 33.0 32.6 33.8  RDW 12.6 12.9 12.7  PLT 157 159 150   Thyroid No results for input(s): "TSH", "FREET4" in the last 168 hours.  BNPNo results for input(s): "BNP", "PROBNP" in the last 168 hours.  DDimer No results for input(s): "DDIMER" in the last 168 hours.   Radiology  VAS US DOPPLER PRE CABG  Result Date: 01/27/2023 PREOPERATIVE VASCULAR EVALUATION Patient Name:  Jon [name]  Date of Exam:   01/27/2023 Medical Rec #: [id number]            Accession #:    [id number] Date of Birth: [birth year]             Patient Gender: M Patient Age:   77 years Exam Location:  [facility] Procedure:      VAS US DOPPLER PRE CABG Referring Phys: Evelene Croon --------------------------------------------------------------------------------  Indications:      Pre-CABG. Risk Factors:     Hypertension, hyperlipidemia, Diabetes, current smoker. Limitations:      L mid upper arm IV, patient talking Comparison Study: 10/24/2019 - Narrative & Impression                   CLINICAL DATA: 77 year old male with a history of dizziness                    EXAM:                   BILATERAL CAROTID DUPLEX ULTRASOUND                    TECHNIQUE:                   Wallace Cullens scale imaging, color Doppler and duplex ultrasound were                    performed of bilateral carotid and vertebral arteries in the                   neck.                    COMPARISON: None.                    FINDINGS:                   Criteria: Quantification of carotid stenosis is based on                   velocity                   parameters that correlate the residual internal carotid                   diameter                   with NASCET-based stenosis levels, using the diameter of the                   distal                   internal carotid lumen as the denominator for stenosis                   measurement.                    The following velocity measurements were obtained:                    RIGHT                    ICA: Systolic 76 cm/sec, Diastolic 23 cm/sec  CCA: 77 cm/sec                    SYSTOLIC ICA/CCA RATIO: 1.0                    ECA: 63 cm/sec                    LEFT                    ICA: Systolic 63 cm/sec, Diastolic 15 cm/sec                    CCA: 99 cm/sec                    SYSTOLIC ICA/CCA RATIO: 0.6                    ECA: 124 cm/sec                    Right Brachial SBP: 121                    Left Brachial SBP: 109                    RIGHT CAROTID ARTERY: No significant calcified disease of the                   right                   common carotid artery. Intermediate waveform maintained.                   Heterogeneous plaque without significant calcifications at the                   right                   carotid bifurcation. Low resistance waveform of the right ICA.                   No                   significant tortuosity.                    RIGHT VERTEBRAL ARTERY: Antegrade flow with low resistance                   waveform.                    LEFT CAROTID ARTERY: No significant calcified disease of the                   left                   common carotid artery. Intermediate waveform maintained.                   Heterogeneous plaque at the left carotid bifurcation without                    significant calcifications. Low resistance waveform of the                   left ICA.                    LEFT VERTEBRAL ARTERY: Reversal  of the left vertebral                   waveform.                    IMPRESSION:                   Color duplex indicates minimal heterogeneous plaque, with no                   hemodynamically significant stenosis by duplex criteria in the                   extracranial cerebrovascular circulation.                    Reversal of the left vertebral artery, suggesting a proximal                   left                   subclavian artery stenosis. Office based repeat bilateral                   upper                   extremity blood pressure assessment may be useful, as there is                   a                   borderline significant differential measured on today's study.                   Alternatively, CT angiogram of the chest to include the aortic                   arch                   may be considered if further workup is indicated. Performing Technologist: Olen Cordial RVT  Examination Guidelines: A complete evaluation includes B-mode imaging, spectral Doppler, color Doppler, and power Doppler as needed of all accessible portions of each vessel. Bilateral testing is considered an integral part of a complete examination. Limited examinations for reoccurring indications may be performed as noted.  Right Carotid Findings: +----------+--------+--------+--------+-----------------------+--------+           PSV cm/sEDV cm/sStenosisDescribe               Comments +----------+--------+--------+--------+-----------------------+--------+ CCA Prox  76      12                                              +----------+--------+--------+--------+-----------------------+--------+ CCA Distal57      12              smooth and heterogenous         +----------+--------+--------+--------+-----------------------+--------+ ICA Prox  72      24              smooth  and heterogenous         +----------+--------+--------+--------+-----------------------+--------+ ICA Mid   76      22                                              +----------+--------+--------+--------+-----------------------+--------+  ICA Distal69      24                                     tortuous +----------+--------+--------+--------+-----------------------+--------+ ECA       58      8                                               +----------+--------+--------+--------+-----------------------+--------+ +----------+--------+-------+--------+------------+           PSV cm/sEDV cmsDescribeArm Pressure +----------+--------+-------+--------+------------+ Subclavian120                                 +----------+--------+-------+--------+------------+ +---------+--------+--+--------+--+---------+ VertebralPSV cm/s29EDV cm/s10Antegrade +---------+--------+--+--------+--+---------+ Left Carotid Findings: +----------+--------+--------+--------+-----------------------+--------+           PSV cm/sEDV cm/sStenosisDescribe               Comments +----------+--------+--------+--------+-----------------------+--------+ CCA Prox  89      15                                              +----------+--------+--------+--------+-----------------------+--------+ CCA Distal61      15              smooth and heterogenous         +----------+--------+--------+--------+-----------------------+--------+ ICA Prox  46      13              smooth and heterogenous         +----------+--------+--------+--------+-----------------------+--------+ ICA Mid   47      16                                              +----------+--------+--------+--------+-----------------------+--------+ ICA Distal63      20                                     tortuous +----------+--------+--------+--------+-----------------------+--------+ ECA       60      8                                                +----------+--------+--------+--------+-----------------------+--------+ +----------+--------+--------+--------+------------+ SubclavianPSV cm/sEDV cm/sDescribeArm Pressure +----------+--------+--------+--------+------------+           134                                  +----------+--------+--------+--------+------------+ +---------+--------+---+--------+--+---------+ VertebralPSV cm/s150EDV cm/s31Antegrade +---------+--------+---+--------+--+---------+  ABI Findings: +--------+------------------+-----+-----------+--------+ Right   Rt Pressure (mmHg)IndexWaveform   Comment  +--------+------------------+-----+-----------+--------+ [id number]                    triphasic           +--------+------------------+-----+-----------+--------+ PTA     117  0.91 multiphasic         +--------+------------------+-----+-----------+--------+ DP      101               0.78 monophasic          +--------+------------------+-----+-----------+--------+ +--------+------------------+-----+-----------+-------+ Left    Lt Pressure (mmHg)IndexWaveform   Comment +--------+------------------+-----+-----------+-------+ Brachial                                  IV      +--------+------------------+-----+-----------+-------+ PTA     114               0.88 multiphasic        +--------+------------------+-----+-----------+-------+ DP      110               0.85 monophasic         +--------+------------------+-----+-----------+-------+ +-------+---------------+----------------+ ABI/TBIToday's ABI/TBIPrevious ABI/TBI +-------+---------------+----------------+ Right  0.91                            +-------+---------------+----------------+ Left   0.88                            +-------+---------------+----------------+  Right Doppler Findings: +--------+--------+-----+---------+--------+ Site    PressureIndexDoppler   Comments +--------+--------+-----+---------+--------+ [id number]          triphasic         +--------+--------+-----+---------+--------+ Radial               triphasic         +--------+--------+-----+---------+--------+ Ulnar                triphasic         +--------+--------+-----+---------+--------+  Left Doppler Findings: +--------+--------+-----+---------+--------+ Site    PressureIndexDoppler  Comments +--------+--------+-----+---------+--------+ Brachial                      IV       +--------+--------+-----+---------+--------+ Radial               triphasic         +--------+--------+-----+---------+--------+ Ulnar                triphasic         +--------+--------+-----+---------+--------+   Summary: Right Carotid: Velocities in the right ICA are consistent with a 1-39% stenosis. Left Carotid: Velocities in the left ICA are consistent with a 1-39% stenosis. Vertebrals: Bilateral vertebral arteries demonstrate antegrade flow. Right ABI: Resting right ankle-brachial index indicates mild right lower extremity arterial disease. Left ABI: Resting left ankle-brachial index indicates mild left lower extremity arterial disease. Right Upper Extremity: Doppler waveform obliterate with right radial compression. Doppler waveform obliterate with right ulnar compression. Left Upper Extremity: Doppler waveforms remain within normal limits with left radial compression. Doppler waveforms decrease >50% with left ulnar compression.  Electronically signed by Waverly Ferrari MD on 01/27/2023 at 4:51:23 PM.    Final     Cardiac Studies     Patient Profile     77 y.o. male with history of known multivessel CAD with prior recommendation for CABG evaluation with recent inferior ST elevation MI status post PCI/DES to the RCA in 12/2022, left subclavian artery stenosis with associated subclavian steal status post stenting in 12/2021, remote CVA/TIA, DM2, HTN, HLD, prior tobacco  use with likely emphysema transferred to [facility] from [facility] for further cardiac  workup. Pt admitted there with diaphoresis, weakness and worsening dyspnea.   Assessment & Plan    CAD/Unstable Angina: He underwent cardiac cath 12/2022 in the setting of acute inferior MI with stenting of the RCA. Was also noted to have severe left main stenosis and severe LAD/Lcx and OM disease. Presented to [facility] with unstable angina and transferred to [facility] for CABG elevation. Seen by Dr. Laneta Simmers with plans for CABG on 01/30/23. Plavix washout this week.  No chest pain this am. Will continue IV heparin while awaiting CABG.  Continue ASA, statin and metoprolol.   HTN: BP is well controlled. No changes today  HLD: Continue statin  CKD: baseline Cr around 1.5, stable yesterday  DM: on SSI   Constipation: He had a bowel movement yesterday. He detailed that for me today.  Lactulose prn.   For questions or updates, please contact [facility] Please consult [email or web address] for contact info under       Signed, Verne Carrow, MD, [facility] 01/28/2023 9:26 AM

## 2023-01-28 NOTE — Plan of Care (Signed)
Problem: Education: Goal: Knowledge of General Education information will improve Description: Including pain rating scale, medication(s)/side effects and non-pharmacologic comfort measures Outcome: Progressing   Problem: Activity: Goal: Risk for activity intolerance will decrease Outcome: Progressing   Problem: Coping: Goal: Level of anxiety will decrease Outcome: Progressing

## 2023-01-28 NOTE — Care Management Important Message (Signed)
Important Message  Patient Details  Name: Jon [name] MRN: [id number] Date of Birth: April 29, 1946   Medicare Important Message Given:  Yes     Haris Baack Stefan Church 01/28/2023, 12:51 PM

## 2023-01-28 NOTE — Progress Notes (Signed)
CARDIAC REHAB PHASE I      Pre-op OHS education including OHS booklet, OHS handout, IS use, mobility importance, home needs at discharge and sternal precautions/move in the tub reviewed. All questions and concerns addressed. Will continue to follow.  [id number] Woodroe Chen, RN BSN 01/28/2023 2:24 PM

## 2023-01-28 NOTE — Plan of Care (Signed)

## 2023-01-28 NOTE — Progress Notes (Signed)
ANTICOAGULATION CONSULT NOTE - Follow Up  Pharmacy Consult for heparin Indication: chest pain/ACS  Allergies  Allergen Reactions   Gabapentin Other (See Comments)    Nightmares.    Lisinopril Other (See Comments)    Hypotension   Metformin And Related Diarrhea    Patient Measurements: Height: 6\' 1"  (185.4 cm) Weight: 90.5 kg (199 lb 8.3 oz) IBW/kg (Calculated) : 79.9 Heparin Dosing Weight: 91 kg   Vital Signs: Temp: 98.1 F (36.7 C) (09/18 0426) Temp Source: Oral (09/18 0426) BP: 93/49 (09/17 2353) Pulse Rate: 70 (09/18 0426)  Labs: Recent Labs    01/25/23 1754 01/25/23 1757 01/26/23 0313 01/26/23 1343 01/27/23 0302 01/27/23 1210 01/28/23 0444  HGB 13.2  --  13.2  --  12.7*  --  13.4  HCT 40.0  --  40.0  --  38.9*  --  39.6  PLT 168  --  157  --  159  --  150  APTT 54*  --   --   --   --   --   --   LABPROT 13.5  --   --   --   --   --   --   INR 1.0  --   --   --   --   --   --   HEPARINUNFRC  --    < > 0.29*   < > 0.78* 0.39 0.49  CREATININE 1.54*  --   --   --  1.46*  --   --    < > = values in this interval not displayed.    Estimated Creatinine Clearance: 48.6 mL/min (A) (by C-G formula based on SCr of 1.46 mg/dL (H)).   Medical History: Past Medical History:  Diagnosis Date   BENIGN POSITIONAL VERTIGO 05/20/2010   Qualifier: Diagnosis of  By: Linford Arnold MD, Ronnette Juniper emphysema [facility])    Cerebrovascular disease 07/15/2018   Prior CVA & TIA. Carotid CTA ~50% Bilateral ICA,  Multifocal severe stenosis of the left V2 vertebral artery.  Right vertebral artery patent without significant stenosis.   Coronary artery disease involving native coronary artery with angina pectoris (HCC) 11/18/2017   Severe LM, LAD & RCA stenosis indicated on Coronary CTA => CaCardiac Cath 01/02/2022: dLM ~50%; mLAD 85%@D1  90% (1,1,1) - m-dLAD 90$ @ 65% D2 (0,1,1), RI 75%, pLCx 30%-OM1 60%, pRCA 25% - p-mRCA hazy 85%. Normal LVEF by Echo, EDP 16 mmHg. = Best option  CABGrdiac Cath    Current every day smoker    No interest in quitting   Diabetes mellitus, type II, insulin dependent (HCC)    With peripheral neuropathy, peripheral vascular disease and coronary artery disease as complications.   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 => 12/17/2021: L Subclavia A PTA=Stent with resolution of Sx.   TIA (transient ischemic attack) 11/11/2021   Brief episode of left sided arm pain and slurred speech    Assessment:  77 yoM transferred from [facility] for possible CABG. Patient has ben on heparin for several days at the outside facility and was transported with heparin infusing. Pharmacy has been consulted to manage the heparin infusion. CABG planned for Friday  -heparin level= 0.49 on 950 units/hr, CBC stable  Goal of Therapy:  Heparin level 0.3-0.7 units/ml Monitor platelets by anticoagulation protocol: Yes   Plan:  -Continue heparin at 950 units/hr -Daily heparin level and CBC  Harland German, PharmD Clinical Pharmacist **Pharmacist phone directory can now be found on [email or web address] ([email or web address]).  Listed under [facility].

## 2023-01-28 NOTE — Plan of Care (Signed)
  Problem: Education: Goal: Knowledge of General Education information will improve Description: Including pain rating scale, medication(s)/side effects and non-pharmacologic comfort measures 01/28/2023 2129 by Carolanne Grumbling, RN Outcome: Progressing 01/28/2023 2128 by Carolanne Grumbling, RN Outcome: Progressing   Problem: Clinical Measurements: Goal: Respiratory complications will improve Outcome: Progressing   Problem: Activity: Goal: Risk for activity intolerance will decrease 01/28/2023 2129 by Carolanne Grumbling, RN Outcome: Progressing 01/28/2023 2128 by Carolanne Grumbling, RN Outcome: Progressing   Problem: Coping: Goal: Level of anxiety will decrease 01/28/2023 2129 by Carolanne Grumbling, RN Outcome: Progressing 01/28/2023 2128 by Carolanne Grumbling, RN Outcome: Progressing

## 2023-01-29 ENCOUNTER — Inpatient Hospital Stay (HOSPITAL_COMMUNITY): Payer: Medicare Other

## 2023-01-29 DIAGNOSIS — I200 Unstable angina: Secondary | ICD-10-CM | POA: Diagnosis not present

## 2023-01-29 DIAGNOSIS — I25110 Atherosclerotic heart disease of native coronary artery with unstable angina pectoris: Secondary | ICD-10-CM | POA: Diagnosis not present

## 2023-01-29 LAB — GLUCOSE, CAPILLARY
Glucose-Capillary: 106 mg/dL — ABNORMAL HIGH (ref 70–99)
Glucose-Capillary: 112 mg/dL — ABNORMAL HIGH (ref 70–99)
Glucose-Capillary: 108 mg/dL — ABNORMAL HIGH (ref 70–99)
Glucose-Capillary: 109 mg/dL — ABNORMAL HIGH (ref 70–99)

## 2023-01-29 LAB — SURGICAL PCR SCREEN
MRSA, PCR: NEGATIVE
Staphylococcus aureus: NEGATIVE

## 2023-01-29 LAB — BASIC METABOLIC PANEL
Anion gap: 13 (ref 5–15)
BUN: 15 mg/dL (ref 8–23)
CO2: 22 mmol/L (ref 22–32)
Calcium: 9.7 mg/dL (ref 8.9–10.3)
Chloride: 103 mmol/L (ref 98–111)
Creatinine, Ser: 1.69 mg/dL — ABNORMAL HIGH (ref 0.61–1.24)
GFR, Estimated: 42 mL/min — ABNORMAL LOW (ref >60)
Glucose, Bld: 133 mg/dL — ABNORMAL HIGH (ref 70–99)
Potassium: 4.6 mmol/L (ref 3.5–5.1)
Sodium: 138 mmol/L (ref 135–145)

## 2023-01-29 LAB — CBC
HCT: 40.6 % (ref 39.0–52.0)
Hemoglobin: 13.4 g/dL (ref 13.0–17.0)
MCH: 31.8 pg (ref 26.0–34.0)
MCHC: 33 g/dL (ref 30.0–36.0)
MCV: 96.2 fL (ref 80.0–100.0)
Platelets: 176 10*3/uL (ref 150–400)
RBC: 4.22 MIL/uL (ref 4.22–5.81)
RDW: 12.9 % (ref 11.5–15.5)
WBC: 5.8 10*3/uL (ref 4.0–10.5)
nRBC: 0 % (ref 0.0–0.2)

## 2023-01-29 LAB — URINALYSIS, ROUTINE W REFLEX MICROSCOPIC
Bacteria, UA: NONE SEEN
Bilirubin Urine: NEGATIVE
Glucose, UA: >=500 mg/dL — AB
Hgb urine dipstick: NEGATIVE
Ketones, ur: NEGATIVE mg/dL
Leukocytes,Ua: NEGATIVE
Nitrite: NEGATIVE
Protein, ur: NEGATIVE mg/dL
Specific Gravity, Urine: 1.021 (ref 1.005–1.030)
pH: 5 (ref 5.0–8.0)

## 2023-01-29 LAB — TYPE AND SCREEN
ABO/RH(D): A POS
Antibody Screen: NEGATIVE

## 2023-01-29 LAB — ABO/RH: ABO/RH(D): A POS

## 2023-01-29 LAB — HEPARIN LEVEL (UNFRACTIONATED): Heparin Unfractionated: 0.57 IU/mL (ref 0.30–0.70)

## 2023-01-29 MED ORDER — EPINEPHRINE HCL 5 MG/250ML IV SOLN IN NS
0.0000 ug/min | INTRAVENOUS | Status: DC
  Filled 2023-01-29: qty 250

## 2023-01-29 MED ORDER — CEFAZOLIN SODIUM-DEXTROSE 2-4 GM/100ML-% IV SOLN
2.0000 g | INTRAVENOUS | Status: AC
  Administered 2023-01-30: 2 g via INTRAVENOUS
  Filled 2023-01-29: qty 100

## 2023-01-29 MED ORDER — NOREPINEPHRINE 4 MG/250ML-% IV SOLN
0.0000 ug/min | INTRAVENOUS | Status: DC
  Filled 2023-01-29: qty 250

## 2023-01-29 MED ORDER — TRANEXAMIC ACID (OHS) BOLUS VIA INFUSION
15.0000 mg/kg | INTRAVENOUS | Status: AC
  Administered 2023-01-30: 1359 mg via INTRAVENOUS
  Filled 2023-01-29: qty 1359

## 2023-01-29 MED ORDER — MUPIROCIN 2 % EX OINT
1.0000 | TOPICAL_OINTMENT | Freq: Two times a day (BID) | CUTANEOUS | Status: AC
  Administered 2023-01-29 – 2023-02-03 (×8): 1 via NASAL
  Filled 2023-01-29 (×3): qty 22

## 2023-01-29 MED ORDER — TRANEXAMIC ACID (OHS) PUMP PRIME SOLUTION
2.0000 mg/kg | INTRAVENOUS | Status: DC
  Filled 2023-01-29: qty 1.81

## 2023-01-29 MED ORDER — ZOLPIDEM TARTRATE 5 MG PO TABS: 5.0000 mg | ORAL_TABLET | Freq: Every evening | ORAL | Status: DC | PRN

## 2023-01-29 MED ORDER — VANCOMYCIN HCL 1250 MG/250ML IV SOLN
1250.0000 mg | INTRAVENOUS | Status: DC
  Filled 2023-01-29: qty 250

## 2023-01-29 MED ORDER — INSULIN REGULAR(HUMAN) IN NACL 100-0.9 UT/100ML-% IV SOLN
INTRAVENOUS | Status: AC
  Administered 2023-01-30: 1.7 [IU]/h via INTRAVENOUS
  Filled 2023-01-29: qty 100

## 2023-01-29 MED ORDER — ACETAMINOPHEN 325 MG PO TABS: 650.0000 mg | ORAL_TABLET | ORAL | Status: DC | PRN

## 2023-01-29 MED ORDER — HEPARIN 30,000 UNITS/1000 ML (OHS) CELLSAVER SOLUTION
Status: DC
  Filled 2023-01-29: qty 1000

## 2023-01-29 MED ORDER — MILRINONE LACTATE IN DEXTROSE 20-5 MG/100ML-% IV SOLN
0.3000 ug/kg/min | INTRAVENOUS | Status: DC
  Filled 2023-01-29: qty 100

## 2023-01-29 MED ORDER — TRANEXAMIC ACID 1000 MG/10ML IV SOLN
1.5000 mg/kg/h | INTRAVENOUS | Status: AC
  Administered 2023-01-30: 1.5 mg/kg/h via INTRAVENOUS
  Filled 2023-01-29: qty 25

## 2023-01-29 MED ORDER — MAGNESIUM SULFATE 50 % IJ SOLN
40.0000 meq | INTRAMUSCULAR | Status: DC
  Filled 2023-01-29: qty 9.85

## 2023-01-29 MED ORDER — DEXMEDETOMIDINE HCL IN NACL 400 MCG/100ML IV SOLN
0.1000 ug/kg/h | INTRAVENOUS | Status: AC
  Administered 2023-01-30: .4 ug/kg/h via INTRAVENOUS
  Filled 2023-01-29: qty 100

## 2023-01-29 MED ORDER — PHENYLEPHRINE HCL-NACL 20-0.9 MG/250ML-% IV SOLN
30.0000 ug/min | INTRAVENOUS | Status: AC
  Administered 2023-01-30: 30 ug/min via INTRAVENOUS
  Filled 2023-01-29: qty 250

## 2023-01-29 MED ORDER — NITROGLYCERIN IN D5W 200-5 MCG/ML-% IV SOLN
2.0000 ug/min | INTRAVENOUS | Status: DC
  Filled 2023-01-29: qty 250

## 2023-01-29 MED ORDER — ALPRAZOLAM 0.25 MG PO TABS
0.2500 mg | ORAL_TABLET | Freq: Two times a day (BID) | ORAL | Status: DC | PRN
  Administered 2023-01-31: 0.25 mg via ORAL
  Filled 2023-01-29: qty 1

## 2023-01-29 MED ORDER — POTASSIUM CHLORIDE 2 MEQ/ML IV SOLN
80.0000 meq | INTRAVENOUS | Status: DC
  Filled 2023-01-29: qty 40

## 2023-01-29 MED ORDER — PLASMA-LYTE A IV SOLN
INTRAVENOUS | Status: DC
  Filled 2023-01-29: qty 2.5

## 2023-01-29 MED ORDER — BISACODYL 5 MG PO TBEC
5.0000 mg | DELAYED_RELEASE_TABLET | Freq: Once | ORAL | Status: AC
  Administered 2023-01-29: 5 mg via ORAL
  Filled 2023-01-29: qty 1

## 2023-01-29 MED ORDER — VANCOMYCIN HCL 1500 MG/300ML IV SOLN
1500.0000 mg | INTRAVENOUS | Status: AC
  Administered 2023-01-30: 1500 mg via INTRAVENOUS
  Filled 2023-01-29: qty 300

## 2023-01-29 MED ORDER — ALUM & MAG HYDROXIDE-SIMETH 200-200-20 MG/5ML PO SUSP: 30.0000 mL | Freq: Four times a day (QID) | ORAL | Status: DC | PRN

## 2023-01-29 MED ORDER — ONDANSETRON HCL 4 MG/2ML IJ SOLN: 4.0000 mg | Freq: Four times a day (QID) | INTRAMUSCULAR | Status: DC | PRN

## 2023-01-29 NOTE — H&P (View-Only) (Signed)
Subjective: No chest pain or SOB.  Objective: Vital signs in last 24 hours: Temp:  [97.9 F (36.6 C)-98.1 F (36.7 C)] 98 F (36.7 C) (09/19 1056) Pulse Rate:  [63-74] 67 (09/19 1056) Cardiac Rhythm: Normal sinus rhythm (09/19 1151) Resp:  [18] 18 (09/19 1056) BP: (112-149)/(69-79) 112/76 (09/19 1056) SpO2:  [99 %] 99 % (09/19 1056) Weight:  [90.6 kg] 90.6 kg (09/19 0412)  Hemodynamic parameters for last 24 hours:    Intake/Output from previous day: 09/18 0701 - 09/19 0700 In: 1166.1 [P.O.:900; I.V.:266.1] Out: 500 [Urine:500] Intake/Output this shift: Total I/O In: 477 [P.O.:477] Out: 375 [Urine:375]  General appearance: alert and cooperative Heart: regular rate and rhythm Lungs: clear to auscultation bilaterally  Lab Results: Recent Labs    01/28/23 0444 01/29/23 1515  WBC 5.3 5.8  HGB 13.4 13.4  HCT 39.6 40.6  PLT 150 176   BMET:  Recent Labs    01/27/23 0302  NA 135  K 4.5  CL 105  CO2 24  GLUCOSE 106*  BUN 18  CREATININE 1.46*  CALCIUM 8.8*    PT/INR: No results for input(s): "LABPROT", "INR" in the last 72 hours. ABG    Component Value Date/Time   PHART 7.390 12/11/2022 0337   HCO3 21.4 12/11/2022 0337   TCO2 22 12/11/2022 0337   ACIDBASEDEF 3.0 (H) 12/11/2022 0337   O2SAT 95 12/11/2022 0337   CBG (last 3)  Recent Labs    01/28/23 2106 01/29/23 0610 01/29/23 1054  GLUCAP 140* 106* 112*    Assessment/Plan:  He is stable for CABG tomorrow. I reviewed the surgical procedure again with him and answered his questions.   LOS: 4 days    [name] 01/29/2023

## 2023-01-29 NOTE — Anesthesia Preprocedure Evaluation (Addendum)
Anesthesia Evaluation  Patient identified by MRN, date of birth, ID band Patient awake    Reviewed: Allergy & Precautions, NPO status , Patient's Chart, lab work & pertinent test results, reviewed documented beta blocker date and time   History of Anesthesia Complications Negative for: history of anesthetic complications  Airway Mallampati: II  TM Distance: >3 FB Neck ROM: Full    Dental  (+) Edentulous Lower   Pulmonary neg sleep apnea, COPD, neg recent URI, former smoker   breath sounds clear to auscultation       Cardiovascular hypertension, + angina  + CAD, + Past MI and + Peripheral Vascular Disease   Rhythm:Regular Rate:Normal     Neuro/Psych  PSYCHIATRIC DISORDERS Anxiety     TIACVA    GI/Hepatic ,GERD  ,,(+) neg Cirrhosis        Endo/Other  diabetes, Type 2    Renal/GU CRFRenal disease     Musculoskeletal   Abdominal   Peds  Hematology   Anesthesia Other Findings   Reproductive/Obstetrics                             Anesthesia Physical Anesthesia Plan  ASA: 4  Anesthesia Plan: General   Post-op Pain Management:    Induction: Intravenous  PONV Risk Score and Plan: 1 and Ondansetron and Propofol infusion  Airway Management Planned: Oral ETT  Additional Equipment: Arterial line, TEE, CVP, PA Cath and Ultrasound Guidance Line Placement  Intra-op Plan:   Post-operative Plan: Post-operative intubation/ventilation  Informed Consent: I have reviewed the patients History and Physical, chart, labs and discussed the procedure including the risks, benefits and alternatives for the proposed anesthesia with the patient or authorized representative who has indicated his/her understanding and acceptance.       Plan Discussed with: CRNA  Anesthesia Plan Comments: (Admitted with unstable angina. TTE with low normal LV function, normal RV function, normal valves. Cath with 75%  stenosis distal LM, 70% prox LAD, 75% mid LAD,  95% OM1, 100% prox RCA (s/p PCI 12/2022). Hgb 13.4, platelets 176. Prior left subclavian stenosis s/p stenting. )        Anesthesia Quick Evaluation

## 2023-01-29 NOTE — Progress Notes (Signed)
RT attempted ABG x2 without any success. RN will see if an VBG is acceptable.

## 2023-01-29 NOTE — Progress Notes (Signed)
CARDIAC REHAB PHASE I    Pt resting in bed, feeling well today. Reviewed pre-op education provided yesterday. Able to address several questions for pt. He is eager to have surgery and begin recovery. Will continue to follow.    1030-1100  Woodroe Chen, RN BSN 01/29/2023 10:58 AM

## 2023-01-29 NOTE — Plan of Care (Signed)

## 2023-01-29 NOTE — Progress Notes (Signed)
  Subjective: No chest pain or SOB.  Objective: Vital signs in last 24 hours: Temp:  [97.9 F (36.6 C)-98.1 F (36.7 C)] 98 F (36.7 C) (09/19 1056) Pulse Rate:  [63-74] 67 (09/19 1056) Cardiac Rhythm: Normal sinus rhythm (09/19 1151) Resp:  [18] 18 (09/19 1056) BP: (112-149)/(69-79) 112/76 (09/19 1056) SpO2:  [99 %] 99 % (09/19 1056) Weight:  [90.6 kg] 90.6 kg (09/19 0412)  Hemodynamic parameters for last 24 hours:    Intake/Output from previous day: 09/18 0701 - 09/19 0700 In: 1166.1 [P.O.:900; I.V.:266.1] Out: 500 [Urine:500] Intake/Output this shift: Total I/O In: 477 [P.O.:477] Out: 375 [Urine:375]  General appearance: alert and cooperative Heart: regular rate and rhythm Lungs: clear to auscultation bilaterally  Lab Results: Recent Labs    01/28/23 0444 01/29/23 1515  WBC 5.3 5.8  HGB 13.4 13.4  HCT 39.6 40.6  PLT 150 176   BMET:  Recent Labs    01/27/23 0302  NA 135  K 4.5  CL 105  CO2 24  GLUCOSE 106*  BUN 18  CREATININE 1.46*  CALCIUM 8.8*    PT/INR: No results for input(s): "LABPROT", "INR" in the last 72 hours. ABG    Component Value Date/Time   PHART 7.390 12/11/2022 0337   HCO3 21.4 12/11/2022 0337   TCO2 22 12/11/2022 0337   ACIDBASEDEF 3.0 (H) 12/11/2022 0337   O2SAT 95 12/11/2022 0337   CBG (last 3)  Recent Labs    01/28/23 2106 01/29/23 0610 01/29/23 1054  GLUCAP 140* 106* 112*    Assessment/Plan:  He is stable for CABG tomorrow. I reviewed the surgical procedure again with him and answered his questions.   LOS: 4 days    [name] 01/29/2023

## 2023-01-29 NOTE — Addendum Note (Signed)
Addended by: Chalmers Cater on: 01/29/2023 11:08 AM   Modules accepted: Orders

## 2023-01-29 NOTE — Plan of Care (Signed)

## 2023-01-29 NOTE — Progress Notes (Signed)
Rounding Note    Patient Name: Jon [name] Date of Encounter: 01/29/2023  [facility] Cardiologist: [name], MD   Subjective   No chest pain or dyspnea  Inpatient Medications    Scheduled Meds:  aspirin EC  81 mg Oral Daily   atorvastatin  80 mg Oral QHS   insulin aspart  0-5 Units Subcutaneous QHS   insulin aspart  0-9 Units Subcutaneous TID WC   insulin glargine-yfgn  20 Units Subcutaneous Daily   metoprolol succinate  25 mg Oral Daily   Continuous Infusions:  heparin 950 Units/hr (01/28/23 2105)   PRN Meds: acetaminophen, albuterol, bisacodyl, lactulose, magnesium hydroxide, nitroGLYCERIN, ondansetron (ZOFRAN) IV, traZODone   Vital Signs    Vitals:   01/28/23 1948 01/29/23 0035 01/29/23 0412 01/29/23 0732  BP:  112/69 (!) 149/79 116/71  Pulse: 74 63 65   Resp:    18  Temp: 98.1 F (36.7 C) 98 F (36.7 C) 98.1 F (36.7 C) 97.9 F (36.6 C)  TempSrc: Oral Oral Oral Oral  SpO2:      Weight:   90.6 kg   Height:        Intake/Output Summary (Last 24 hours) at 01/29/2023 0833 Last data filed at 01/29/2023 0816 Gross per 24 hour  Intake 1406.06 ml  Output 500 ml  Net 906.06 ml      01/29/2023    4:12 AM 01/28/2023    4:26 AM 01/27/2023    5:24 AM  Last 3 Weights  Weight (lbs) 199 lb 11.8 oz 199 lb 8.3 oz 200 lb 1.6 oz  Weight (kg) 90.6 kg 90.5 kg 90.765 kg      Telemetry    sinus - Personally Reviewed  ECG    No morning tracing - Personally Reviewed  Physical Exam   General: Well developed, well nourished, NAD  HEENT: OP clear, mucus membranes moist  SKIN: warm, dry. No rashes. Neuro: No focal deficits  Musculoskeletal: Muscle strength 5/5 all ext  Psychiatric: Mood and affect normal  Neck: No JVD Lungs:Clear bilaterally, no wheezes, rhonci, crackles Cardiovascular: Regular rate and rhythm. No murmurs, gallops or rubs. Abdomen:Soft. Bowel sounds present. Non-tender.  Extremities: No lower extremity edema.  Labs     High Sensitivity Troponin:  No results for input(s): "TROPONINIHS" in the last 720 hours.   Chemistry Recent Labs  Lab 01/25/23 1754 01/27/23 0302  NA 136 135  K 4.6 4.5  CL 104 105  CO2 25 24  GLUCOSE 155* 106*  BUN 16 18  CREATININE 1.54* 1.46*  CALCIUM 8.7* 8.8*  PROT 6.7  --   ALBUMIN 3.7  --   AST 21  --   ALT 27  --   ALKPHOS 66  --   BILITOT 0.5  --   GFRNONAA 46* 50*  ANIONGAP 7 6    Lipids No results for input(s): "CHOL", "TRIG", "HDL", "LABVLDL", "LDLCALC", "CHOLHDL" in the last 168 hours.  Hematology Recent Labs  Lab 01/26/23 0313 01/27/23 0302 01/28/23 0444  WBC 5.8 6.1 5.3  RBC 4.19* 4.06* 4.11*  HGB 13.2 12.7* 13.4  HCT 40.0 38.9* 39.6  MCV 95.5 95.8 96.4  MCH 31.5 31.3 32.6  MCHC 33.0 32.6 33.8  RDW 12.6 12.9 12.7  PLT 157 159 150   Thyroid No results for input(s): "TSH", "FREET4" in the last 168 hours.  BNPNo results for input(s): "BNP", "PROBNP" in the last 168 hours.  DDimer No results for input(s): "DDIMER" in the last 168 hours.  Radiology    VAS US DOPPLER PRE CABG  Result Date: 01/27/2023 PREOPERATIVE VASCULAR EVALUATION Patient Name:  Jon [name]  Date of Exam:   01/27/2023 Medical Rec #: [id number]            Accession #:    [id number] Date of Birth: [birth year]             Patient Gender: M Patient Age:   77 years Exam Location:  [facility] Procedure:      VAS US DOPPLER PRE CABG Referring Phys: Evelene Croon --------------------------------------------------------------------------------  Indications:      Pre-CABG. Risk Factors:     Hypertension, hyperlipidemia, Diabetes, current smoker. Limitations:      L mid upper arm IV, patient talking Comparison Study: 10/24/2019 - Narrative & Impression                   CLINICAL DATA: 77 year old male with a history of dizziness                    EXAM:                   BILATERAL CAROTID DUPLEX ULTRASOUND                    TECHNIQUE:                   Wallace Cullens scale imaging, color Doppler  and duplex ultrasound were                   performed of bilateral carotid and vertebral arteries in the                   neck.                    COMPARISON: None.                    FINDINGS:                   Criteria: Quantification of carotid stenosis is based on                   velocity                   parameters that correlate the residual internal carotid                   diameter                   with NASCET-based stenosis levels, using the diameter of the                   distal                   internal carotid lumen as the denominator for stenosis                   measurement.                    The following velocity measurements were obtained:                    RIGHT                    ICA: Systolic 76 cm/sec, Diastolic 23 cm/sec  CCA: 77 cm/sec                    SYSTOLIC ICA/CCA RATIO: 1.0                    ECA: 63 cm/sec                    LEFT                    ICA: Systolic 63 cm/sec, Diastolic 15 cm/sec                    CCA: 99 cm/sec                    SYSTOLIC ICA/CCA RATIO: 0.6                    ECA: 124 cm/sec                    Right Brachial SBP: 121                    Left Brachial SBP: 109                    RIGHT CAROTID ARTERY: No significant calcified disease of the                   right                   common carotid artery. Intermediate waveform maintained.                   Heterogeneous plaque without significant calcifications at the                   right                   carotid bifurcation. Low resistance waveform of the right ICA.                   No                   significant tortuosity.                    RIGHT VERTEBRAL ARTERY: Antegrade flow with low resistance                   waveform.                    LEFT CAROTID ARTERY: No significant calcified disease of the                   left                   common carotid artery. Intermediate waveform maintained.                   Heterogeneous plaque at the left carotid  bifurcation without                   significant calcifications. Low resistance waveform of the                   left ICA.                    LEFT VERTEBRAL ARTERY: Reversal  of the left vertebral                   waveform.                    IMPRESSION:                   Color duplex indicates minimal heterogeneous plaque, with no                   hemodynamically significant stenosis by duplex criteria in the                   extracranial cerebrovascular circulation.                    Reversal of the left vertebral artery, suggesting a proximal                   left                   subclavian artery stenosis. Office based repeat bilateral                   upper                   extremity blood pressure assessment may be useful, as there is                   a                   borderline significant differential measured on today's study.                   Alternatively, CT angiogram of the chest to include the aortic                   arch                   may be considered if further workup is indicated. Performing Technologist: Olen Cordial RVT  Examination Guidelines: A complete evaluation includes B-mode imaging, spectral Doppler, color Doppler, and power Doppler as needed of all accessible portions of each vessel. Bilateral testing is considered an integral part of a complete examination. Limited examinations for reoccurring indications may be performed as noted.  Right Carotid Findings: +----------+--------+--------+--------+-----------------------+--------+           PSV cm/sEDV cm/sStenosisDescribe               Comments +----------+--------+--------+--------+-----------------------+--------+ CCA Prox  76      12                                              +----------+--------+--------+--------+-----------------------+--------+ CCA Distal57      12              smooth and heterogenous         +----------+--------+--------+--------+-----------------------+--------+ ICA  Prox  72      24              smooth and heterogenous         +----------+--------+--------+--------+-----------------------+--------+ ICA Mid   76      22                                              +----------+--------+--------+--------+-----------------------+--------+  ICA Distal69      24                                     tortuous +----------+--------+--------+--------+-----------------------+--------+ ECA       58      8                                               +----------+--------+--------+--------+-----------------------+--------+ +----------+--------+-------+--------+------------+           PSV cm/sEDV cmsDescribeArm Pressure +----------+--------+-------+--------+------------+ Subclavian120                                 +----------+--------+-------+--------+------------+ +---------+--------+--+--------+--+---------+ VertebralPSV cm/s29EDV cm/s10Antegrade +---------+--------+--+--------+--+---------+ Left Carotid Findings: +----------+--------+--------+--------+-----------------------+--------+           PSV cm/sEDV cm/sStenosisDescribe               Comments +----------+--------+--------+--------+-----------------------+--------+ CCA Prox  89      15                                              +----------+--------+--------+--------+-----------------------+--------+ CCA Distal61      15              smooth and heterogenous         +----------+--------+--------+--------+-----------------------+--------+ ICA Prox  46      13              smooth and heterogenous         +----------+--------+--------+--------+-----------------------+--------+ ICA Mid   47      16                                              +----------+--------+--------+--------+-----------------------+--------+ ICA Distal63      20                                     tortuous +----------+--------+--------+--------+-----------------------+--------+  ECA       60      8                                               +----------+--------+--------+--------+-----------------------+--------+ +----------+--------+--------+--------+------------+ SubclavianPSV cm/sEDV cm/sDescribeArm Pressure +----------+--------+--------+--------+------------+           134                                  +----------+--------+--------+--------+------------+ +---------+--------+---+--------+--+---------+ VertebralPSV cm/s150EDV cm/s31Antegrade +---------+--------+---+--------+--+---------+  ABI Findings: +--------+------------------+-----+-----------+--------+ Right   Rt Pressure (mmHg)IndexWaveform   Comment  +--------+------------------+-----+-----------+--------+ [id number]                    triphasic           +--------+------------------+-----+-----------+--------+ PTA     117  0.91 multiphasic         +--------+------------------+-----+-----------+--------+ DP      101               0.78 monophasic          +--------+------------------+-----+-----------+--------+ +--------+------------------+-----+-----------+-------+ Left    Lt Pressure (mmHg)IndexWaveform   Comment +--------+------------------+-----+-----------+-------+ Brachial                                  IV      +--------+------------------+-----+-----------+-------+ PTA     114               0.88 multiphasic        +--------+------------------+-----+-----------+-------+ DP      110               0.85 monophasic         +--------+------------------+-----+-----------+-------+ +-------+---------------+----------------+ ABI/TBIToday's ABI/TBIPrevious ABI/TBI +-------+---------------+----------------+ Right  0.91                            +-------+---------------+----------------+ Left   0.88                            +-------+---------------+----------------+  Right Doppler Findings:  +--------+--------+-----+---------+--------+ Site    PressureIndexDoppler  Comments +--------+--------+-----+---------+--------+ [id number]          triphasic         +--------+--------+-----+---------+--------+ Radial               triphasic         +--------+--------+-----+---------+--------+ Ulnar                triphasic         +--------+--------+-----+---------+--------+  Left Doppler Findings: +--------+--------+-----+---------+--------+ Site    PressureIndexDoppler  Comments +--------+--------+-----+---------+--------+ Brachial                      IV       +--------+--------+-----+---------+--------+ Radial               triphasic         +--------+--------+-----+---------+--------+ Ulnar                triphasic         +--------+--------+-----+---------+--------+   Summary: Right Carotid: Velocities in the right ICA are consistent with a 1-39% stenosis. Left Carotid: Velocities in the left ICA are consistent with a 1-39% stenosis. Vertebrals: Bilateral vertebral arteries demonstrate antegrade flow. Right ABI: Resting right ankle-brachial index indicates mild right lower extremity arterial disease. Left ABI: Resting left ankle-brachial index indicates mild left lower extremity arterial disease. Right Upper Extremity: Doppler waveform obliterate with right radial compression. Doppler waveform obliterate with right ulnar compression. Left Upper Extremity: Doppler waveforms remain within normal limits with left radial compression. Doppler waveforms decrease >50% with left ulnar compression.  Electronically signed by Waverly Ferrari MD on 01/27/2023 at 4:51:23 PM.    Final     Cardiac Studies     Patient Profile     77 y.o. male with history of known multivessel CAD with prior recommendation for CABG evaluation with recent inferior ST elevation MI status post PCI/DES to the RCA in 12/2022, left subclavian artery stenosis with associated subclavian  steal status post stenting in 12/2021, remote CVA/TIA, DM2, HTN, HLD, prior tobacco use with likely emphysema transferred to [facility] from [facility] for further cardiac  workup. Pt admitted there with diaphoresis, weakness and worsening dyspnea.   Assessment & Plan    CAD/Unstable Angina: He underwent cardiac cath 12/2022 in the setting of acute inferior MI with stenting of the RCA. Was also noted to have severe left main stenosis and severe LAD/Lcx and OM disease. Presented to [facility] with unstable angina and transferred to [facility] for CABG elevation. Seen by Dr. Laneta Simmers with plans for CABG on 01/30/23. Plavix washout this week.   No chest pain since admission.  Continue IV heparin Continue ASA, statin and metoprolol.  OR tomorrow for CABG  HTN: BP is controlled. No changes  HLD: Continue statin  CKD: baseline Cr around 1.5. BMET this am  DM: on SSI   Constipation: Resolved. Lactulose prn.   For questions or updates, please contact [facility] Please consult [email or web address] for contact info under       Signed, Verne Carrow, MD, [facility] 01/29/2023 8:33 AM

## 2023-01-30 ENCOUNTER — Inpatient Hospital Stay (HOSPITAL_COMMUNITY): Payer: Medicare Other | Admitting: Anesthesiology

## 2023-01-30 ENCOUNTER — Encounter (HOSPITAL_COMMUNITY): Payer: Self-pay | Admitting: Internal Medicine

## 2023-01-30 ENCOUNTER — Other Ambulatory Visit: Payer: Self-pay

## 2023-01-30 ENCOUNTER — Inpatient Hospital Stay (HOSPITAL_COMMUNITY): Payer: Medicare Other

## 2023-01-30 ENCOUNTER — Inpatient Hospital Stay (HOSPITAL_COMMUNITY)
Admission: RE | Disposition: A | Discharge status: Skilled Nursing Facility | Payer: Self-pay | Source: Other Acute Inpatient Hospital | Attending: Surgery

## 2023-01-30 DIAGNOSIS — I25110 Atherosclerotic heart disease of native coronary artery with unstable angina pectoris: Secondary | ICD-10-CM

## 2023-01-30 DIAGNOSIS — N183 Chronic kidney disease, stage 3 unspecified: Secondary | ICD-10-CM | POA: Diagnosis not present

## 2023-01-30 DIAGNOSIS — J449 Chronic obstructive pulmonary disease, unspecified: Secondary | ICD-10-CM | POA: Diagnosis not present

## 2023-01-30 DIAGNOSIS — I129 Hypertensive chronic kidney disease with stage 1 through stage 4 chronic kidney disease, or unspecified chronic kidney disease: Secondary | ICD-10-CM | POA: Diagnosis not present

## 2023-01-30 DIAGNOSIS — I25119 Atherosclerotic heart disease of native coronary artery with unspecified angina pectoris: Secondary | ICD-10-CM | POA: Diagnosis not present

## 2023-01-30 DIAGNOSIS — Z951 Presence of aortocoronary bypass graft: Secondary | ICD-10-CM

## 2023-01-30 HISTORY — DX: Presence of aortocoronary bypass graft: Z95.1

## 2023-01-30 HISTORY — PX: CORONARY ARTERY BYPASS GRAFT: SHX141

## 2023-01-30 HISTORY — PX: TEE WITHOUT CARDIOVERSION: SHX5443

## 2023-01-30 LAB — POCT I-STAT 7, (LYTES, BLD GAS, ICA,H+H)
Acid-base deficit: 6 mmol/L — ABNORMAL HIGH (ref 0.0–2.0)
Acid-base deficit: 5 mmol/L — ABNORMAL HIGH (ref 0.0–2.0)
Acid-base deficit: 4 mmol/L — ABNORMAL HIGH (ref 0.0–2.0)
Acid-base deficit: 5 mmol/L — ABNORMAL HIGH (ref 0.0–2.0)
Acid-base deficit: 5 mmol/L — ABNORMAL HIGH (ref 0.0–2.0)
Acid-base deficit: 6 mmol/L — ABNORMAL HIGH (ref 0.0–2.0)
Acid-base deficit: 8 mmol/L — ABNORMAL HIGH (ref 0.0–2.0)
Bicarbonate: 19.6 mmol/L — ABNORMAL LOW (ref 20.0–28.0)
Bicarbonate: 21.1 mmol/L (ref 20.0–28.0)
Bicarbonate: 19.8 mmol/L — ABNORMAL LOW (ref 20.0–28.0)
Bicarbonate: 20.5 mmol/L (ref 20.0–28.0)
Bicarbonate: 19.8 mmol/L — ABNORMAL LOW (ref 20.0–28.0)
Bicarbonate: 19.4 mmol/L — ABNORMAL LOW (ref 20.0–28.0)
Bicarbonate: 16.9 mmol/L — ABNORMAL LOW (ref 20.0–28.0)
Calcium, Ion: 1.22 mmol/L (ref 1.15–1.40)
Calcium, Ion: 0.93 mmol/L — ABNORMAL LOW (ref 1.15–1.40)
Calcium, Ion: 0.96 mmol/L — ABNORMAL LOW (ref 1.15–1.40)
Calcium, Ion: 1 mmol/L — ABNORMAL LOW (ref 1.15–1.40)
Calcium, Ion: 1.03 mmol/L — ABNORMAL LOW (ref 1.15–1.40)
Calcium, Ion: 1.04 mmol/L — ABNORMAL LOW (ref 1.15–1.40)
Calcium, Ion: 1.08 mmol/L — ABNORMAL LOW (ref 1.15–1.40)
HCT: 35 % — ABNORMAL LOW (ref 39.0–52.0)
HCT: 23 % — ABNORMAL LOW (ref 39.0–52.0)
HCT: 22 % — ABNORMAL LOW (ref 39.0–52.0)
HCT: 26 % — ABNORMAL LOW (ref 39.0–52.0)
HCT: 26 % — ABNORMAL LOW (ref 39.0–52.0)
HCT: 23 % — ABNORMAL LOW (ref 39.0–52.0)
HCT: 24 % — ABNORMAL LOW (ref 39.0–52.0)
Hemoglobin: 11.9 g/dL — ABNORMAL LOW (ref 13.0–17.0)
Hemoglobin: 7.8 g/dL — ABNORMAL LOW (ref 13.0–17.0)
Hemoglobin: 7.5 g/dL — ABNORMAL LOW (ref 13.0–17.0)
Hemoglobin: 8.8 g/dL — ABNORMAL LOW (ref 13.0–17.0)
Hemoglobin: 8.8 g/dL — ABNORMAL LOW (ref 13.0–17.0)
Hemoglobin: 7.8 g/dL — ABNORMAL LOW (ref 13.0–17.0)
Hemoglobin: 8.2 g/dL — ABNORMAL LOW (ref 13.0–17.0)
O2 Saturation: 100 %
O2 Saturation: 100 %
O2 Saturation: 100 %
O2 Saturation: 100 %
O2 Saturation: 99 %
O2 Saturation: 99 %
O2 Saturation: 99 %
Patient temperature: 35.9
Patient temperature: 36.5
Patient temperature: 36.4
Potassium: 4.2 mmol/L (ref 3.5–5.1)
Potassium: 4.6 mmol/L (ref 3.5–5.1)
Potassium: 5.5 mmol/L — ABNORMAL HIGH (ref 3.5–5.1)
Potassium: 5.8 mmol/L — ABNORMAL HIGH (ref 3.5–5.1)
Potassium: 4.7 mmol/L (ref 3.5–5.1)
Potassium: 4.3 mmol/L (ref 3.5–5.1)
Potassium: 4.4 mmol/L (ref 3.5–5.1)
Sodium: 139 mmol/L (ref 135–145)
Sodium: 139 mmol/L (ref 135–145)
Sodium: 136 mmol/L (ref 135–145)
Sodium: 137 mmol/L (ref 135–145)
Sodium: 139 mmol/L (ref 135–145)
Sodium: 140 mmol/L (ref 135–145)
Sodium: 139 mmol/L (ref 135–145)
TCO2: 21 mmol/L — ABNORMAL LOW (ref 22–32)
TCO2: 22 mmol/L (ref 22–32)
TCO2: 21 mmol/L — ABNORMAL LOW (ref 22–32)
TCO2: 22 mmol/L (ref 22–32)
TCO2: 21 mmol/L — ABNORMAL LOW (ref 22–32)
TCO2: 20 mmol/L — ABNORMAL LOW (ref 22–32)
TCO2: 18 mmol/L — ABNORMAL LOW (ref 22–32)
pCO2 arterial: 38.9 mmHg (ref 32–48)
pCO2 arterial: 41 mmHg (ref 32–48)
pCO2 arterial: 32.8 mmHg (ref 32–48)
pCO2 arterial: 35.2 mmHg (ref 32–48)
pCO2 arterial: 32.3 mmHg (ref 32–48)
pCO2 arterial: 34.8 mmHg (ref 32–48)
pCO2 arterial: 31.8 mmHg — ABNORMAL LOW (ref 32–48)
pH, Arterial: 7.31 — ABNORMAL LOW (ref 7.35–7.45)
pH, Arterial: 7.319 — ABNORMAL LOW (ref 7.35–7.45)
pH, Arterial: 7.372 (ref 7.35–7.45)
pH, Arterial: 7.388 (ref 7.35–7.45)
pH, Arterial: 7.391 (ref 7.35–7.45)
pH, Arterial: 7.351 (ref 7.35–7.45)
pH, Arterial: 7.331 — ABNORMAL LOW (ref 7.35–7.45)
pO2, Arterial: 401 mmHg — ABNORMAL HIGH (ref 83–108)
pO2, Arterial: 425 mmHg — ABNORMAL HIGH (ref 83–108)
pO2, Arterial: 341 mmHg — ABNORMAL HIGH (ref 83–108)
pO2, Arterial: 381 mmHg — ABNORMAL HIGH (ref 83–108)
pO2, Arterial: 112 mmHg — ABNORMAL HIGH (ref 83–108)
pO2, Arterial: 133 mmHg — ABNORMAL HIGH (ref 83–108)
pO2, Arterial: 132 mmHg — ABNORMAL HIGH (ref 83–108)

## 2023-01-30 LAB — CBC
HCT: 37.3 % — ABNORMAL LOW (ref 39.0–52.0)
HCT: 27.1 % — ABNORMAL LOW (ref 39.0–52.0)
HCT: 25.2 % — ABNORMAL LOW (ref 39.0–52.0)
Hemoglobin: 12.5 g/dL — ABNORMAL LOW (ref 13.0–17.0)
Hemoglobin: 9 g/dL — ABNORMAL LOW (ref 13.0–17.0)
Hemoglobin: 8.1 g/dL — ABNORMAL LOW (ref 13.0–17.0)
MCH: 32.5 pg (ref 26.0–34.0)
MCH: 32 pg (ref 26.0–34.0)
MCH: 31.2 pg (ref 26.0–34.0)
MCHC: 33.5 g/dL (ref 30.0–36.0)
MCHC: 33.2 g/dL (ref 30.0–36.0)
MCHC: 32.1 g/dL (ref 30.0–36.0)
MCV: 96.9 fL (ref 80.0–100.0)
MCV: 96.4 fL (ref 80.0–100.0)
MCV: 96.9 fL (ref 80.0–100.0)
Platelets: 144 10*3/uL — ABNORMAL LOW (ref 150–400)
Platelets: 89 10*3/uL — ABNORMAL LOW (ref 150–400)
Platelets: 112 10*3/uL — ABNORMAL LOW (ref 150–400)
RBC: 3.85 MIL/uL — ABNORMAL LOW (ref 4.22–5.81)
RBC: 2.81 MIL/uL — ABNORMAL LOW (ref 4.22–5.81)
RBC: 2.6 MIL/uL — ABNORMAL LOW (ref 4.22–5.81)
RDW: 13.1 % (ref 11.5–15.5)
RDW: 13.2 % (ref 11.5–15.5)
RDW: 13.4 % (ref 11.5–15.5)
WBC: 5.2 10*3/uL (ref 4.0–10.5)
WBC: 7.9 10*3/uL (ref 4.0–10.5)
WBC: 6.6 10*3/uL (ref 4.0–10.5)
nRBC: 0 % (ref 0.0–0.2)
nRBC: 0 % (ref 0.0–0.2)
nRBC: 0 % (ref 0.0–0.2)

## 2023-01-30 LAB — ECHO INTRAOPERATIVE TEE
AR max vel: 2.48 cm2
AV Area VTI: 1.97 cm2
AV Area mean vel: 2.26 cm2
AV Mean grad: 4.7 mmHg
AV Peak grad: 9.6 mmHg
Ao pk vel: 1.55 m/s
Area-P 1/2: 3.93 cm2
Height: 73 in
Weight: 3195.79 oz

## 2023-01-30 LAB — POCT I-STAT, CHEM 8
BUN: 16 mg/dL (ref 8–23)
BUN: 15 mg/dL (ref 8–23)
BUN: 14 mg/dL (ref 8–23)
BUN: 15 mg/dL (ref 8–23)
BUN: 14 mg/dL (ref 8–23)
Calcium, Ion: 1.25 mmol/L (ref 1.15–1.40)
Calcium, Ion: 1.24 mmol/L (ref 1.15–1.40)
Calcium, Ion: 1 mmol/L — ABNORMAL LOW (ref 1.15–1.40)
Calcium, Ion: 1 mmol/L — ABNORMAL LOW (ref 1.15–1.40)
Calcium, Ion: 1 mmol/L — ABNORMAL LOW (ref 1.15–1.40)
Chloride: 107 mmol/L (ref 98–111)
Chloride: 107 mmol/L (ref 98–111)
Chloride: 103 mmol/L (ref 98–111)
Chloride: 104 mmol/L (ref 98–111)
Chloride: 104 mmol/L (ref 98–111)
Creatinine, Ser: 1.4 mg/dL — ABNORMAL HIGH (ref 0.61–1.24)
Creatinine, Ser: 1.4 mg/dL — ABNORMAL HIGH (ref 0.61–1.24)
Creatinine, Ser: 1.2 mg/dL (ref 0.61–1.24)
Creatinine, Ser: 1.2 mg/dL (ref 0.61–1.24)
Creatinine, Ser: 1.2 mg/dL (ref 0.61–1.24)
Glucose, Bld: 115 mg/dL — ABNORMAL HIGH (ref 70–99)
Glucose, Bld: 111 mg/dL — ABNORMAL HIGH (ref 70–99)
Glucose, Bld: 89 mg/dL (ref 70–99)
Glucose, Bld: 90 mg/dL (ref 70–99)
Glucose, Bld: 119 mg/dL — ABNORMAL HIGH (ref 70–99)
HCT: 33 % — ABNORMAL LOW (ref 39.0–52.0)
HCT: 31 % — ABNORMAL LOW (ref 39.0–52.0)
HCT: 24 % — ABNORMAL LOW (ref 39.0–52.0)
HCT: 33 % — ABNORMAL LOW (ref 39.0–52.0)
HCT: 25 % — ABNORMAL LOW (ref 39.0–52.0)
Hemoglobin: 11.2 g/dL — ABNORMAL LOW (ref 13.0–17.0)
Hemoglobin: 10.5 g/dL — ABNORMAL LOW (ref 13.0–17.0)
Hemoglobin: 11.2 g/dL — ABNORMAL LOW (ref 13.0–17.0)
Hemoglobin: 8.2 g/dL — ABNORMAL LOW (ref 13.0–17.0)
Hemoglobin: 8.5 g/dL — ABNORMAL LOW (ref 13.0–17.0)
Potassium: 4.2 mmol/L (ref 3.5–5.1)
Potassium: 4.3 mmol/L (ref 3.5–5.1)
Potassium: 5 mmol/L (ref 3.5–5.1)
Potassium: 5.4 mmol/L — ABNORMAL HIGH (ref 3.5–5.1)
Potassium: 5.6 mmol/L — ABNORMAL HIGH (ref 3.5–5.1)
Sodium: 140 mmol/L (ref 135–145)
Sodium: 138 mmol/L (ref 135–145)
Sodium: 135 mmol/L (ref 135–145)
Sodium: 135 mmol/L (ref 135–145)
Sodium: 137 mmol/L (ref 135–145)
TCO2: 23 mmol/L (ref 22–32)
TCO2: 21 mmol/L — ABNORMAL LOW (ref 22–32)
TCO2: 23 mmol/L (ref 22–32)
TCO2: 24 mmol/L (ref 22–32)
TCO2: 25 mmol/L (ref 22–32)

## 2023-01-30 LAB — BASIC METABOLIC PANEL
Anion gap: 9 (ref 5–15)
BUN: 12 mg/dL (ref 8–23)
CO2: 20 mmol/L — ABNORMAL LOW (ref 22–32)
Calcium: 7.2 mg/dL — ABNORMAL LOW (ref 8.9–10.3)
Chloride: 109 mmol/L (ref 98–111)
Creatinine, Ser: 1.33 mg/dL — ABNORMAL HIGH (ref 0.61–1.24)
GFR, Estimated: 55 mL/min — ABNORMAL LOW (ref >60)
Glucose, Bld: 130 mg/dL — ABNORMAL HIGH (ref 70–99)
Potassium: 4.2 mmol/L (ref 3.5–5.1)
Sodium: 138 mmol/L (ref 135–145)

## 2023-01-30 LAB — GLUCOSE, CAPILLARY
Glucose-Capillary: 100 mg/dL — ABNORMAL HIGH (ref 70–99)
Glucose-Capillary: 120 mg/dL — ABNORMAL HIGH (ref 70–99)
Glucose-Capillary: 124 mg/dL — ABNORMAL HIGH (ref 70–99)
Glucose-Capillary: 126 mg/dL — ABNORMAL HIGH (ref 70–99)
Glucose-Capillary: 150 mg/dL — ABNORMAL HIGH (ref 70–99)
Glucose-Capillary: 83 mg/dL (ref 70–99)
Glucose-Capillary: 89 mg/dL (ref 70–99)
Glucose-Capillary: 93 mg/dL (ref 70–99)
Glucose-Capillary: 97 mg/dL (ref 70–99)

## 2023-01-30 LAB — POCT I-STAT EG7
Acid-Base Excess: 0 mmol/L (ref 0.0–2.0)
Bicarbonate: 26.4 mmol/L (ref 20.0–28.0)
Calcium, Ion: 0.97 mmol/L — ABNORMAL LOW (ref 1.15–1.40)
HCT: 24 % — ABNORMAL LOW (ref 39.0–52.0)
Hemoglobin: 8.2 g/dL — ABNORMAL LOW (ref 13.0–17.0)
O2 Saturation: 83 %
Potassium: 4.1 mmol/L (ref 3.5–5.1)
Sodium: 144 mmol/L (ref 135–145)
TCO2: 28 mmol/L (ref 22–32)
pCO2, Ven: 49.2 mmHg (ref 44–60)
pH, Ven: 7.338 (ref 7.25–7.43)
pO2, Ven: 51 mmHg — ABNORMAL HIGH (ref 32–45)

## 2023-01-30 LAB — MAGNESIUM: Magnesium: 3.1 mg/dL — ABNORMAL HIGH (ref 1.7–2.4)

## 2023-01-30 LAB — HEMOGLOBIN AND HEMATOCRIT, BLOOD
HCT: 25.8 % — ABNORMAL LOW (ref 39.0–52.0)
Hemoglobin: 8.7 g/dL — ABNORMAL LOW (ref 13.0–17.0)

## 2023-01-30 LAB — APTT: aPTT: 32 seconds (ref 24–36)

## 2023-01-30 LAB — PLATELET COUNT: Platelets: 107 10*3/uL — ABNORMAL LOW (ref 150–400)

## 2023-01-30 LAB — SARS CORONAVIRUS 2 BY RT PCR: SARS Coronavirus 2 by RT PCR: NEGATIVE

## 2023-01-30 LAB — HEPARIN LEVEL (UNFRACTIONATED): Heparin Unfractionated: 0.49 IU/mL (ref 0.30–0.70)

## 2023-01-30 LAB — PROTIME-INR
INR: 1.4 — ABNORMAL HIGH (ref 0.8–1.2)
Prothrombin Time: 17.3 seconds — ABNORMAL HIGH (ref 11.4–15.2)

## 2023-01-30 SURGERY — CORONARY ARTERY BYPASS GRAFTING (CABG)
Anesthesia: General | Site: Chest

## 2023-01-30 MED ORDER — PROTAMINE SULFATE 10 MG/ML IV SOLN
INTRAVENOUS | Status: DC | PRN
  Administered 2023-01-30: 370 mg via INTRAVENOUS

## 2023-01-30 MED ORDER — VANCOMYCIN HCL IN DEXTROSE 1-5 GM/200ML-% IV SOLN
1000.0000 mg | Freq: Once | INTRAVENOUS | Status: AC
  Administered 2023-01-30: 1000 mg via INTRAVENOUS
  Filled 2023-01-30: qty 200

## 2023-01-30 MED ORDER — THROMBIN 20000 UNITS EX KIT: PACK | CUTANEOUS | Status: DC | PRN

## 2023-01-30 MED ORDER — SODIUM CHLORIDE 0.9 % IV SOLN: 250.0000 mL | INTRAVENOUS | Status: DC

## 2023-01-30 MED ORDER — ONDANSETRON HCL 4 MG/2ML IJ SOLN: 4.0000 mg | Freq: Four times a day (QID) | INTRAMUSCULAR | Status: DC | PRN

## 2023-01-30 MED ORDER — LIDOCAINE 2% (20 MG/ML) 5 ML SYRINGE
INTRAMUSCULAR | Status: AC
  Filled 2023-01-30: qty 5

## 2023-01-30 MED ORDER — THROMBIN 20000 UNITS EX SOLR: OROMUCOSAL | Status: DC | PRN

## 2023-01-30 MED ORDER — ACETAMINOPHEN 160 MG/5ML PO SOLN: 1000.0000 mg | Freq: Four times a day (QID) | ORAL | Status: DC

## 2023-01-30 MED ORDER — ONDANSETRON HCL 4 MG/2ML IJ SOLN
INTRAMUSCULAR | Status: DC | PRN
  Administered 2023-01-30: 4 mg via INTRAVENOUS

## 2023-01-30 MED ORDER — 0.9 % SODIUM CHLORIDE (POUR BTL) OPTIME
TOPICAL | Status: DC | PRN
Start: 2023-01-30 — End: 2023-01-30
  Administered 2023-01-30: 2000 mL

## 2023-01-30 MED ORDER — PLASMA-LYTE A IV SOLN
INTRAVENOUS | Status: DC | PRN
  Administered 2023-01-30: 500 mL via INTRAVASCULAR

## 2023-01-30 MED ORDER — DEXTROSE 50 % IV SOLN: 0.0000 mL | INTRAVENOUS | Status: DC | PRN

## 2023-01-30 MED ORDER — ALBUMIN HUMAN 5 % IV SOLN: INTRAVENOUS | Status: DC | PRN

## 2023-01-30 MED ORDER — FENTANYL CITRATE (PF) 250 MCG/5ML IJ SOLN
INTRAMUSCULAR | Status: AC
  Filled 2023-01-30: qty 5

## 2023-01-30 MED ORDER — CHLORHEXIDINE GLUCONATE 0.12 % MT SOLN
OROMUCOSAL | Status: AC
  Filled 2023-01-30: qty 15

## 2023-01-30 MED ORDER — SODIUM CHLORIDE 0.9% FLUSH
3.0000 mL | Freq: Two times a day (BID) | INTRAVENOUS | Status: DC
  Administered 2023-01-31 – 2023-02-02 (×5): 3 mL via INTRAVENOUS

## 2023-01-30 MED ORDER — PANTOPRAZOLE SODIUM 40 MG PO TBEC
40.0000 mg | DELAYED_RELEASE_TABLET | Freq: Every day | ORAL | Status: DC
  Administered 2023-02-01 – 2023-02-09 (×9): 40 mg via ORAL
  Filled 2023-01-30 (×9): qty 1

## 2023-01-30 MED ORDER — THROMBIN 20000 UNITS EX SOLR
CUTANEOUS | Status: DC | PRN
  Administered 2023-01-30: 20000 [IU] via TOPICAL

## 2023-01-30 MED ORDER — LACTATED RINGERS IV SOLN: INTRAVENOUS | Status: DC

## 2023-01-30 MED ORDER — BISACODYL 10 MG RE SUPP: 10.0000 mg | Freq: Every day | RECTAL | Status: DC

## 2023-01-30 MED ORDER — PANTOPRAZOLE SODIUM 40 MG IV SOLR
40.0000 mg | Freq: Every day | INTRAVENOUS | Status: AC
  Administered 2023-01-30 – 2023-01-31 (×2): 40 mg via INTRAVENOUS
  Filled 2023-01-30 (×2): qty 10

## 2023-01-30 MED ORDER — LACTATED RINGERS IV SOLN: INTRAVENOUS | Status: DC | PRN

## 2023-01-30 MED ORDER — PROPOFOL 10 MG/ML IV BOLUS
INTRAVENOUS | Status: AC
  Filled 2023-01-30: qty 20

## 2023-01-30 MED ORDER — ALBUMIN HUMAN 5 % IV SOLN
250.0000 mL | INTRAVENOUS | Status: DC | PRN
  Administered 2023-01-30 (×2): 12.5 g via INTRAVENOUS
  Filled 2023-01-30 (×2): qty 250

## 2023-01-30 MED ORDER — FENTANYL CITRATE (PF) 250 MCG/5ML IJ SOLN
INTRAMUSCULAR | Status: DC | PRN
  Administered 2023-01-30: 50 ug via INTRAVENOUS
  Administered 2023-01-30: 200 ug via INTRAVENOUS
  Administered 2023-01-30: 100 ug via INTRAVENOUS
  Administered 2023-01-30: 250 ug via INTRAVENOUS

## 2023-01-30 MED ORDER — THROMBIN (RECOMBINANT) 20000 UNITS EX SOLR
CUTANEOUS | Status: AC
  Filled 2023-01-30: qty 20000

## 2023-01-30 MED ORDER — SODIUM CHLORIDE 0.9% FLUSH: 3.0000 mL | INTRAVENOUS | Status: DC | PRN

## 2023-01-30 MED ORDER — MORPHINE SULFATE (PF) 2 MG/ML IV SOLN
1.0000 mg | INTRAVENOUS | Status: DC | PRN
  Administered 2023-01-31 (×2): 2 mg via INTRAVENOUS
  Filled 2023-01-30 (×2): qty 1

## 2023-01-30 MED ORDER — LIDOCAINE HCL (CARDIAC) PF 100 MG/5ML IV SOSY
PREFILLED_SYRINGE | INTRAVENOUS | Status: DC | PRN
Start: 2023-01-30 — End: 2023-01-30
  Administered 2023-01-30: 100 mg via INTRAVENOUS

## 2023-01-30 MED ORDER — ASPIRIN 81 MG PO CHEW
324.0000 mg | CHEWABLE_TABLET | Freq: Once | ORAL | Status: AC
  Administered 2023-01-30: 324 mg via ORAL
  Filled 2023-01-30: qty 4

## 2023-01-30 MED ORDER — METOPROLOL TARTRATE 5 MG/5ML IV SOLN: 2.5000 mg | INTRAVENOUS | Status: DC | PRN

## 2023-01-30 MED ORDER — CEFAZOLIN SODIUM-DEXTROSE 2-4 GM/100ML-% IV SOLN
2.0000 g | Freq: Three times a day (TID) | INTRAVENOUS | Status: AC
  Administered 2023-01-30 – 2023-02-01 (×6): 2 g via INTRAVENOUS
  Filled 2023-01-30 (×6): qty 100

## 2023-01-30 MED ORDER — ACETAMINOPHEN 500 MG PO TABS
1000.0000 mg | ORAL_TABLET | Freq: Four times a day (QID) | ORAL | Status: AC
  Administered 2023-01-30 – 2023-02-04 (×13): 1000 mg via ORAL
  Filled 2023-01-30 (×16): qty 2

## 2023-01-30 MED ORDER — SODIUM CHLORIDE 0.45 % IV SOLN: INTRAVENOUS | Status: DC | PRN

## 2023-01-30 MED ORDER — SODIUM CHLORIDE 0.9% IV SOLUTION: Freq: Once | INTRAVENOUS | Status: AC

## 2023-01-30 MED ORDER — CHLORHEXIDINE GLUCONATE CLOTH 2 % EX PADS: 6.0000 | MEDICATED_PAD | Freq: Once | CUTANEOUS | Status: DC

## 2023-01-30 MED ORDER — DIAZEPAM 5 MG PO TABS
5.0000 mg | ORAL_TABLET | Freq: Once | ORAL | Status: AC
  Administered 2023-01-30: 5 mg via ORAL
  Filled 2023-01-30: qty 1

## 2023-01-30 MED ORDER — METOPROLOL TARTRATE 12.5 MG HALF TABLET
12.5000 mg | ORAL_TABLET | Freq: Two times a day (BID) | ORAL | Status: DC
  Administered 2023-01-31 – 2023-02-01 (×3): 12.5 mg via ORAL
  Filled 2023-01-30 (×3): qty 1

## 2023-01-30 MED ORDER — CHLORHEXIDINE GLUCONATE CLOTH 2 % EX PADS
6.0000 | MEDICATED_PAD | Freq: Every day | CUTANEOUS | Status: DC
  Administered 2023-01-30 – 2023-02-02 (×4): 6 via TOPICAL

## 2023-01-30 MED ORDER — ASPIRIN 81 MG PO CHEW: 324.0000 mg | CHEWABLE_TABLET | Freq: Every day | ORAL | Status: DC

## 2023-01-30 MED ORDER — BISACODYL 5 MG PO TBEC
10.0000 mg | DELAYED_RELEASE_TABLET | Freq: Every day | ORAL | Status: DC
  Administered 2023-01-31 – 2023-02-02 (×3): 10 mg via ORAL
  Filled 2023-01-30 (×3): qty 2

## 2023-01-30 MED ORDER — HEPARIN SODIUM (PORCINE) 1000 UNIT/ML IJ SOLN
INTRAMUSCULAR | Status: AC
  Filled 2023-01-30: qty 1

## 2023-01-30 MED ORDER — SODIUM BICARBONATE 8.4 % IV SOLN
50.0000 meq | Freq: Once | INTRAVENOUS | Status: AC
  Administered 2023-01-30: 50 meq via INTRAVENOUS

## 2023-01-30 MED ORDER — METOPROLOL TARTRATE 12.5 MG HALF TABLET
12.5000 mg | ORAL_TABLET | Freq: Once | ORAL | Status: AC
  Administered 2023-01-30: 12.5 mg via ORAL
  Filled 2023-01-30: qty 1

## 2023-01-30 MED ORDER — SODIUM CHLORIDE 0.9 % IV SOLN: INTRAVENOUS | Status: DC

## 2023-01-30 MED ORDER — POTASSIUM CHLORIDE 10 MEQ/50ML IV SOLN: 10.0000 meq | INTRAVENOUS | Status: AC

## 2023-01-30 MED ORDER — PHENYLEPHRINE HCL-NACL 20-0.9 MG/250ML-% IV SOLN: 30.0000 ug/min | INTRAVENOUS | Status: DC

## 2023-01-30 MED ORDER — ROCURONIUM BROMIDE 10 MG/ML (PF) SYRINGE
PREFILLED_SYRINGE | INTRAVENOUS | Status: AC
  Filled 2023-01-30: qty 10

## 2023-01-30 MED ORDER — ACETAMINOPHEN 160 MG/5ML PO SOLN
650.0000 mg | Freq: Once | ORAL | Status: AC
  Administered 2023-01-30: 650 mg
  Filled 2023-01-30: qty 20.3

## 2023-01-30 MED ORDER — METHADONE HCL 10 MG/ML IJ SOLN
0.1500 mg/kg | Freq: Once | INTRAMUSCULAR | Status: AC
  Administered 2023-01-30: 13.59 mg via INTRAVENOUS
  Filled 2023-01-30 (×3): qty 1.36

## 2023-01-30 MED ORDER — MAGNESIUM SULFATE 4 GM/100ML IV SOLN
4.0000 g | Freq: Once | INTRAVENOUS | Status: AC
  Administered 2023-01-30: 4 g via INTRAVENOUS
  Filled 2023-01-30: qty 100

## 2023-01-30 MED ORDER — TEMAZEPAM 7.5 MG PO CAPS: 15.0000 mg | ORAL_CAPSULE | Freq: Once | ORAL | Status: DC | PRN

## 2023-01-30 MED ORDER — PROPOFOL 10 MG/ML IV BOLUS
INTRAVENOUS | Status: DC | PRN
  Administered 2023-01-30: 60 mg via INTRAVENOUS

## 2023-01-30 MED ORDER — SUGAMMADEX SODIUM 200 MG/2ML IV SOLN
INTRAVENOUS | Status: DC | PRN
Start: 2023-01-30 — End: 2023-01-30
  Administered 2023-01-30: 200 mg via INTRAVENOUS

## 2023-01-30 MED ORDER — DOCUSATE SODIUM 100 MG PO CAPS
200.0000 mg | ORAL_CAPSULE | Freq: Every day | ORAL | Status: DC
  Administered 2023-01-31 – 2023-02-02 (×3): 200 mg via ORAL
  Filled 2023-01-30 (×3): qty 2

## 2023-01-30 MED ORDER — MIDAZOLAM HCL 2 MG/2ML IJ SOLN
2.0000 mg | INTRAMUSCULAR | Status: DC | PRN
  Administered 2023-01-30: 2 mg via INTRAVENOUS
  Filled 2023-01-30: qty 2

## 2023-01-30 MED ORDER — SODIUM CHLORIDE 0.9 % IV SOLN: INTRAVENOUS | Status: DC | PRN

## 2023-01-30 MED ORDER — INSULIN ASPART 100 UNIT/ML IJ SOLN
0.0000 [IU] | INTRAMUSCULAR | Status: DC
  Administered 2023-01-31 (×2): 2 [IU] via SUBCUTANEOUS
  Administered 2023-01-31 (×2): 4 [IU] via SUBCUTANEOUS
  Administered 2023-01-31 – 2023-02-01 (×4): 2 [IU] via SUBCUTANEOUS

## 2023-01-30 MED ORDER — TRAMADOL HCL 50 MG PO TABS
50.0000 mg | ORAL_TABLET | ORAL | Status: DC | PRN
  Administered 2023-01-31: 100 mg via ORAL
  Filled 2023-01-30: qty 2

## 2023-01-30 MED ORDER — OXYCODONE HCL 5 MG PO TABS
5.0000 mg | ORAL_TABLET | ORAL | Status: DC | PRN
  Administered 2023-01-31: 5 mg via ORAL
  Administered 2023-01-31: 10 mg via ORAL
  Filled 2023-01-30: qty 1
  Filled 2023-01-30: qty 2

## 2023-01-30 MED ORDER — MIDAZOLAM HCL (PF) 5 MG/ML IJ SOLN
INTRAMUSCULAR | Status: DC | PRN
  Administered 2023-01-30: 2 mg via INTRAVENOUS

## 2023-01-30 MED ORDER — INSULIN REGULAR(HUMAN) IN NACL 100-0.9 UT/100ML-% IV SOLN: INTRAVENOUS | Status: DC

## 2023-01-30 MED ORDER — MIDAZOLAM HCL (PF) 10 MG/2ML IJ SOLN
INTRAMUSCULAR | Status: AC
  Filled 2023-01-30: qty 2

## 2023-01-30 MED ORDER — HEMOSTATIC AGENTS (NO CHARGE) OPTIME
TOPICAL | Status: DC | PRN
Start: 2023-01-30 — End: 2023-01-30
  Administered 2023-01-30: 1 via TOPICAL

## 2023-01-30 MED ORDER — METOPROLOL TARTRATE 25 MG/10 ML ORAL SUSPENSION: 12.5000 mg | Freq: Two times a day (BID) | ORAL | Status: DC

## 2023-01-30 MED ORDER — PROTAMINE SULFATE 10 MG/ML IV SOLN
INTRAVENOUS | Status: AC
  Filled 2023-01-30: qty 10

## 2023-01-30 MED ORDER — METOCLOPRAMIDE HCL 5 MG/ML IJ SOLN
10.0000 mg | Freq: Four times a day (QID) | INTRAMUSCULAR | Status: AC
  Administered 2023-01-30 – 2023-01-31 (×6): 10 mg via INTRAVENOUS
  Filled 2023-01-30 (×6): qty 2

## 2023-01-30 MED ORDER — ROCURONIUM BROMIDE 10 MG/ML (PF) SYRINGE
PREFILLED_SYRINGE | INTRAVENOUS | Status: DC | PRN
  Administered 2023-01-30: 50 mg via INTRAVENOUS
  Administered 2023-01-30: 30 mg via INTRAVENOUS
  Administered 2023-01-30: 100 mg via INTRAVENOUS

## 2023-01-30 MED ORDER — HEPARIN SODIUM (PORCINE) 1000 UNIT/ML IJ SOLN
INTRAMUSCULAR | Status: AC
  Filled 2023-01-30: qty 10

## 2023-01-30 MED ORDER — PHENYLEPHRINE 80 MCG/ML (10ML) SYRINGE FOR IV PUSH (FOR BLOOD PRESSURE SUPPORT)
PREFILLED_SYRINGE | INTRAVENOUS | Status: DC | PRN
  Administered 2023-01-30 (×2): 80 ug via INTRAVENOUS

## 2023-01-30 MED ORDER — CHLORHEXIDINE GLUCONATE 0.12 % MT SOLN
15.0000 mL | Freq: Once | OROMUCOSAL | Status: AC
  Administered 2023-01-30: 15 mL via OROMUCOSAL
  Filled 2023-01-30: qty 15

## 2023-01-30 MED ORDER — ASPIRIN 325 MG PO TBEC
325.0000 mg | DELAYED_RELEASE_TABLET | Freq: Every day | ORAL | Status: DC
  Administered 2023-01-31: 325 mg via ORAL
  Filled 2023-01-30: qty 1

## 2023-01-30 MED ORDER — CHLORHEXIDINE GLUCONATE 0.12 % MT SOLN
15.0000 mL | OROMUCOSAL | Status: AC
  Administered 2023-01-30: 15 mL via OROMUCOSAL
  Filled 2023-01-30: qty 15

## 2023-01-30 MED ORDER — DEXMEDETOMIDINE HCL IN NACL 400 MCG/100ML IV SOLN
0.0000 ug/kg/h | INTRAVENOUS | Status: DC
  Administered 2023-01-30: 0.7 ug/kg/h via INTRAVENOUS
  Filled 2023-01-30: qty 100

## 2023-01-30 MED ORDER — HEPARIN SODIUM (PORCINE) 1000 UNIT/ML IJ SOLN
INTRAMUSCULAR | Status: DC | PRN
  Administered 2023-01-30: 37000 [IU] via INTRAVENOUS

## 2023-01-30 SURGICAL SUPPLY — 106 items
ADH SKN CLS APL DERMABOND .7 (GAUZE/BANDAGES/DRESSINGS) ×2
ANTIFOG SOL W/FOAM PAD STRL (MISCELLANEOUS) ×2
BAG DECANTER FOR FLEXI CONT (MISCELLANEOUS) ×2 IMPLANT
BLADE CLIPPER SURG (BLADE) ×2 IMPLANT
BLADE STERNUM SYSTEM 6 (BLADE) ×2 IMPLANT
BLADE SURG 11 STRL SS (BLADE) IMPLANT
BNDG ELASTIC 4X5.8 VLCR STR LF (GAUZE/BANDAGES/DRESSINGS) ×2 IMPLANT
BNDG ELASTIC 6X5.8 VLCR STR LF (GAUZE/BANDAGES/DRESSINGS) ×2 IMPLANT
BNDG GAUZE DERMACEA FLUFF 4 (GAUZE/BANDAGES/DRESSINGS) ×2 IMPLANT
BNDG GZE DERMACEA 4 6PLY (GAUZE/BANDAGES/DRESSINGS) ×2
CANISTER SUCT 3000ML PPV (MISCELLANEOUS) ×2 IMPLANT
CANNULA ARTERIAL VENT 3/8 20FR (CANNULA) IMPLANT
CANNULA MC2 2 STG 36/46 NON-V (CANNULA) IMPLANT
CATH ROBINSON RED A/P 18FR (CATHETERS) ×4 IMPLANT
CATH THORACIC 28FR (CATHETERS) ×2 IMPLANT
CATH THORACIC 36FR (CATHETERS) ×2 IMPLANT
CATH THORACIC 36FR RT ANG (CATHETERS) ×2 IMPLANT
CLIP TI MEDIUM 24 (CLIP) IMPLANT
CLIP TI WIDE RED SMALL 24 (CLIP) IMPLANT
CONTAINER PROTECT SURGISLUSH (MISCELLANEOUS) ×4 IMPLANT
DERMABOND ADVANCED .7 DNX12 (GAUZE/BANDAGES/DRESSINGS) IMPLANT
DRAPE CARDIOVASCULAR INCISE (DRAPES) ×2
DRAPE SRG 135X102X78XABS (DRAPES) ×2 IMPLANT
DRAPE WARM FLUID 44X44 (DRAPES) ×2 IMPLANT
DRSG COVADERM 4X14 (GAUZE/BANDAGES/DRESSINGS) ×2 IMPLANT
DRSG COVADERM 4X8 (GAUZE/BANDAGES/DRESSINGS) IMPLANT
ELECT BLADE 4.0 EZ CLEAN MEGAD (MISCELLANEOUS) ×2
ELECT CAUTERY BLADE 6.4 (BLADE) ×2 IMPLANT
ELECT REM PT RETURN 9FT ADLT (ELECTROSURGICAL) ×4
ELECTRODE BLDE 4.0 EZ CLN MEGD (MISCELLANEOUS) IMPLANT
ELECTRODE REM PT RTRN 9FT ADLT (ELECTROSURGICAL) ×4 IMPLANT
FELT TEFLON 1X6 (MISCELLANEOUS) ×2 IMPLANT
GAUZE 4X4 16PLY ~~LOC~~+RFID DBL (SPONGE) IMPLANT
GAUZE SPONGE 4X4 12PLY STRL (GAUZE/BANDAGES/DRESSINGS) ×4 IMPLANT
GLOVE BIO SURGEON STRL SZ 6 (GLOVE) IMPLANT
GLOVE BIO SURGEON STRL SZ 6.5 (GLOVE) IMPLANT
GLOVE BIO SURGEON STRL SZ7 (GLOVE) IMPLANT
GLOVE BIO SURGEON STRL SZ7.5 (GLOVE) IMPLANT
GLOVE BIOGEL PI IND STRL 6 (GLOVE) IMPLANT
GLOVE BIOGEL PI IND STRL 6.5 (GLOVE) IMPLANT
GLOVE BIOGEL PI IND STRL 7.0 (GLOVE) IMPLANT
GLOVE ECLIPSE 7.0 STRL STRAW (GLOVE) ×4 IMPLANT
GLOVE ORTHO TXT STRL SZ7.5 (GLOVE) IMPLANT
GOWN STRL REUS W/ TWL LRG LVL3 (GOWN DISPOSABLE) ×8 IMPLANT
GOWN STRL REUS W/ TWL XL LVL3 (GOWN DISPOSABLE) ×2 IMPLANT
GOWN STRL REUS W/TWL LRG LVL3 (GOWN DISPOSABLE) ×8
GOWN STRL REUS W/TWL XL LVL3 (GOWN DISPOSABLE) ×2
HEMOSTAT POWDER SURGIFOAM 1G (HEMOSTASIS) ×6 IMPLANT
HEMOSTAT SURGICEL 2X14 (HEMOSTASIS) ×2 IMPLANT
INSERT FOGARTY 61MM (MISCELLANEOUS) IMPLANT
INSERT FOGARTY XLG (MISCELLANEOUS) IMPLANT
KIT BASIN OR (CUSTOM PROCEDURE TRAY) ×2 IMPLANT
KIT SUCTION CATH 14FR (SUCTIONS) ×2 IMPLANT
KIT TURNOVER KIT B (KITS) ×2 IMPLANT
KIT VASOVIEW HEMOPRO 2 VH 4000 (KITS) ×2 IMPLANT
KNIFE MICRO-UNI 3.5 30 DEG (BLADE) ×2 IMPLANT
MARKER GRAFT CORONARY BYPASS (MISCELLANEOUS) IMPLANT
NS IRRIG 1000ML POUR BTL (IV SOLUTION) ×10 IMPLANT
PACK E OPEN HEART (SUTURE) ×2 IMPLANT
PACK OPEN HEART (CUSTOM PROCEDURE TRAY) ×2 IMPLANT
PAD ARMBOARD 7.5X6 YLW CONV (MISCELLANEOUS) ×4 IMPLANT
PAD ELECT DEFIB RADIOL ZOLL (MISCELLANEOUS) ×2 IMPLANT
PENCIL BUTTON HOLSTER BLD 10FT (ELECTRODE) ×2 IMPLANT
POSITIONER HEAD DONUT 9IN (MISCELLANEOUS) ×2 IMPLANT
PUNCH AORTIC ROTATE 4.0MM (MISCELLANEOUS) IMPLANT
PUNCH AORTIC ROTATE 4.5MM 8IN (MISCELLANEOUS) ×2 IMPLANT
PUNCH AORTIC ROTATE 5MM 8IN (MISCELLANEOUS) IMPLANT
SET MPS 3-ND DEL (MISCELLANEOUS) IMPLANT
SOLUTION ANTFG W/FOAM PAD STRL (MISCELLANEOUS) IMPLANT
SPONGE INTESTINAL PEANUT (DISPOSABLE) IMPLANT
SPONGE T-LAP 18X18 ~~LOC~~+RFID (SPONGE) IMPLANT
SPONGE T-LAP 4X18 ~~LOC~~+RFID (SPONGE) IMPLANT
SUPPORT HEART JANKE-BARRON (MISCELLANEOUS) ×2 IMPLANT
SUT BONE WAX W31G (SUTURE) ×2 IMPLANT
SUT MNCRL AB 4-0 PS2 18 (SUTURE) IMPLANT
SUT PROLENE 3 0 SH DA (SUTURE) IMPLANT
SUT PROLENE 3 0 SH1 36 (SUTURE) ×2 IMPLANT
SUT PROLENE 4 0 RB 1 (SUTURE)
SUT PROLENE 4 0 SH DA (SUTURE) IMPLANT
SUT PROLENE 4-0 RB1 .5 CRCL 36 (SUTURE) IMPLANT
SUT PROLENE 5 0 C 1 36 (SUTURE) IMPLANT
SUT PROLENE 6 0 C 1 30 (SUTURE) IMPLANT
SUT PROLENE 7 0 BV 1 (SUTURE) IMPLANT
SUT PROLENE 7 0 BV1 MDA (SUTURE) ×2 IMPLANT
SUT PROLENE 8 0 BV175 6 (SUTURE) IMPLANT
SUT SILK 1 MH (SUTURE) IMPLANT
SUT SILK 2 0 SH (SUTURE) IMPLANT
SUT STEEL STERNAL CCS#1 18IN (SUTURE) IMPLANT
SUT STEEL SZ 6 DBL 3X14 BALL (SUTURE) IMPLANT
SUT VIC AB 1 CTX 36 (SUTURE) ×6
SUT VIC AB 1 CTX36XBRD ANBCTR (SUTURE) ×4 IMPLANT
SUT VIC AB 2-0 CT1 27 (SUTURE) ×2
SUT VIC AB 2-0 CT1 TAPERPNT 27 (SUTURE) IMPLANT
SUT VIC AB 2-0 CTX 27 (SUTURE) IMPLANT
SUT VIC AB 3-0 SH 27 (SUTURE)
SUT VIC AB 3-0 SH 27X BRD (SUTURE) IMPLANT
SUT VIC AB 3-0 X1 27 (SUTURE) IMPLANT
SUT VICRYL 4-0 PS2 18IN ABS (SUTURE) IMPLANT
SYSTEM SAHARA CHEST DRAIN ATS (WOUND CARE) ×2 IMPLANT
TAPE CLOTH 4X10 WHT NS (GAUZE/BANDAGES/DRESSINGS) IMPLANT
TOWEL GREEN STERILE (TOWEL DISPOSABLE) ×2 IMPLANT
TOWEL GREEN STERILE FF (TOWEL DISPOSABLE) ×2 IMPLANT
TRAY FOLEY SLVR 16FR TEMP STAT (SET/KITS/TRAYS/PACK) ×2 IMPLANT
TUBING LAP HI FLOW INSUFFLATIO (TUBING) ×2 IMPLANT
UNDERPAD 30X36 HEAVY ABSORB (UNDERPADS AND DIAPERS) ×2 IMPLANT
WATER STERILE IRR 1000ML POUR (IV SOLUTION) ×4 IMPLANT

## 2023-01-30 NOTE — Progress Notes (Signed)
Pt did well throughout the rapid wean on the ventilator, however was not able to follow commands and still somnolent at this time. RN and RT made decision to place pt back on previous full support settings and attempt rapid wean in 1 hour per protocol.

## 2023-01-30 NOTE — Hospital Course (Addendum)
History of Present Illness:  Jon [name] is a 77 year old gentleman with a history of remote smoking, type 2 diabetes, hyperlipidemia, hypertension, stage III chronic kidney disease, TIA and remote stroke, left subclavian artery stenosis and subclavian steal syndrome status post stenting of the proximal left subclavian artery and coronary disease.  He underwent catheterization by Dr. Herbie Baltimore on 01/02/2022 showing left main and severe three-vessel coronary disease.  He was referred for CABG evaluation but apparently canceled that appointment and said that he was not going to have surgery. He was continued on medical therapy He presented on 12/11/2022 to [facility] with an acute STEMI.  Troponin was greater than 24,000.  Cardiac catheterization showed the culprit to be an occluded right coronary artery.  There is also about 75% mid to distal left main stenosis.  The LAD had 70% proximal and 75% mid stenosis and was diffusely irregular.  There is a large first marginal with 75% stenosis.  The RCA was successfully opened with DES.  Left ventricular ejection fraction was 45 to 50% by visual estimate with mildly elevated LVEDP.  Plans were made for dual antiplatelet therapy for 1 year and continue medical therapy with close follow-up.  He was in seeing his PCP on 9/13 and developed anginal pain with diaphoresis and generalized weakness. He stated this pain was similar to when he experienced his MI. His troponin was negative.  He was transported to the Emergency Department via EMS.  He was started on heparin and remained chest pain-free.  He requested transfer and was admitted to [facility] for further care.  Hospital Course:   The patient remained chest pain free during admission.  He was evaluated by Dr. Laneta Simmers for coronary bypass grafting procedure.  He was felt to be a candidate for surgical revascularization and the patient was agreeable to proceed.  He was taken to the operating room on 01/30/2023.   He underwent CABG x 3 utilizing LIMA to LAD, SVG to PDA, and SVG to OM.  He also underwent endoscopic harvest of greater saphenous vein from his right leg.  He tolerated the procedure and was taken to the SICU in stable condition. He was extubated early post op day 1. EPW, swan, a line, and chest tubes were removed. He has CKD (stage IIIa)-creatinine was monitored closely as he was diuresed for volume overload. His creatinine is 1.4 He was transitioned off the Insulin drip. His pre op HGA1C was 6.3.He will be restarted on Synjardy later in his post op course. Patient then had hallucinations/confusion;trying to limit narcotics. He went into a fib with RVR and was put on Amiodarone drip. He was restarted on Plavix for recent stent to the RCA so ec asa was decreased to 81 mg daily. He had sundowning the evening of post op day 2 and was given Hadol and Trazodone was continued at bedtime. Narcotics were avoided. He was stable for transfer from the ICU to 4E for further convalescence on 09/23.  All wounds are clean, dry, healing without signs of infection. He has been tolerating a diet. He was weaned off oxygen. He had a fib/fl run early the am of 09/25. His BP was not as labile so he was started on low dose Lopressor. He had an occasional burst of a fib so we monitored the need for anticoagulation. His thrombocytopenia did resolve as last platelets were up to 192,000.  He had severe constipation and required treatment with laxative.  He was evaluated by PT/OT who recommended SNF  placement.  His confusion/sundowning resolved.  His surgical incisions are free from evidence of infection.  He is felt stable for discharge home today.

## 2023-01-30 NOTE — Anesthesia Postprocedure Evaluation (Signed)
Anesthesia Post Note  Patient: Jon [name]  Procedure(s) Performed: CORONARY ARTERY BYPASS GRAFTING TIMES THREE USING LEFT INTERNAL MAMMARY ARTERY AND ENDOHARVEST OF RIGHT LEG GREATER SAPHENOUS VEIN (Chest) TRANSESOPHAGEAL ECHOCARDIOGRAM     Patient location during evaluation: ICU Anesthesia Type: General Level of consciousness: sedated Pain management: pain level controlled Vital Signs Assessment: post-procedure vital signs reviewed and stable Respiratory status: patient remains intubated per anesthesia plan Cardiovascular status: stable Postop Assessment: no apparent nausea or vomiting Anesthetic complications: no   No notable events documented.  Last Vitals:  Vitals:   01/30/23 0452 01/30/23 1240  BP:  113/60  Pulse: 68 65  Resp:  16  Temp: 36.7 C   SpO2:  100%    Last Pain:  Vitals:   01/30/23 0658  TempSrc:   PainSc: 0-No pain                 Mariann Barter

## 2023-01-30 NOTE — Op Note (Signed)
CARDIOVASCULAR SURGERY OPERATIVE NOTE  01/30/2023  Surgeon:  [name], MD  First Assistant: Lowella Dandy,  PA-C:   An experienced assistant was required given the complexity of this surgery and the standard of surgical care. The assistant was needed for endoscopic vein harvest, exposure, dissection, suctioning, retraction of delicate tissues and sutures, instrument exchange and for overall help during this procedure.   Preoperative Diagnosis:  Severe multi-vessel coronary artery disease   Postoperative Diagnosis:  Same   Procedure:  Median Sternotomy Extracorporeal circulation 3.   Coronary artery bypass grafting x 3  Left internal mammary artery graft to the LAD SVG to OM 1 SVG to PDA  4.   Endoscopic vein harvest from the right leg   Anesthesia:  General Endotracheal   Clinical History/Surgical Indication:  This 77 year old gentleman has left main and severe three-vessel coronary disease presenting with unstable anginal symptoms after recent inferior STEMI treated with PCI in August.  I agree that coronary artery bypass graft surgery is the best treatment to prevent further ischemia and infarction and improve his quality of life.  His operative risk is somewhat elevated due to comorbid risk factors including suboptimally controlled diabetes and stage III chronic kidney disease.  He has been on aspirin and Plavix and his Plavix has been discontinued in anticipation of needing bypass surgery. I discussed the operative procedure with the patient and his daughter including alternatives, benefits and risks; including but not limited to bleeding, blood transfusion, infection, stroke, myocardial infarction, graft failure, heart block requiring a permanent pacemaker, organ dysfunction, and death.  Eulas Post understands and agrees to proceed.   Preparation:  The patient was seen  in the preoperative holding area and the correct patient, correct operation were confirmed with the patient after reviewing the medical record and catheterization. The consent was signed by me. Preoperative antibiotics were given. A pulmonary arterial line and radial arterial line were placed by the anesthesia team. The patient was taken back to the operating room and positioned supine on the operating room table. After being placed under general endotracheal anesthesia by the anesthesia team a foley catheter was placed. The neck, chest, abdomen, and both legs were prepped with betadine soap and solution and draped in the usual sterile manner. A surgical time-out was taken and the correct patient and operative procedure were confirmed with the nursing and anesthesia staff.  TEE: performed by Dr. Ace Gins. This showed mild LV systolic dysfunction. The aortic valve was calcified with restricted mobility of the left and non-coronary cusps but the right cusp moved well and the mean gradient was only 6 mm Hg with an AVA of 1.73 cm2. I did not feel that the valve needed to be replaced at this time. Trivial MR.   Cardiopulmonary Bypass:  A median sternotomy was performed. The pericardium was opened in the midline. Right ventricular function appeared normal. The ascending aorta was of normal size and had no palpable plaque. There were no contraindications to aortic cannulation or cross-clamping. The patient was fully systemically heparinized and the ACT was maintained > 400 sec. The proximal aortic arch was cannulated with a 20 F aortic cannula for arterial inflow. Venous cannulation was performed via the right atrial appendage using a two-staged venous cannula. An antegrade cardioplegia/vent cannula was inserted into the mid-ascending aorta. Aortic occlusion was performed with a single cross-clamp. Systemic cooling to 32 degrees Centigrade and topical cooling of the heart with iced saline were used. Hyperkalemic  antegrade cold blood cardioplegia was used  to induce diastolic arrest and was then given at about 20 minute intervals throughout the period of arrest to maintain myocardial temperature at or below 10 degrees centigrade. A temperature probe was inserted into the interventricular septum and an insulating pad was placed in the pericardium.   Left internal mammary artery harvest:  The left side of the sternum was retracted using the Rultract retractor. The left internal mammary artery was harvested as a pedicle graft. All side branches were clipped. It was a medium-sized vessel of good quality with excellent blood flow. It was ligated distally and divided. It was sprayed with topical papaverine solution to prevent vasospasm.   Endoscopic vein harvest: performed by Lowella Dandy, PA-C  The right greater saphenous vein was harvested endoscopically through a 2 cm incision medial to the right knee. It was harvested from the upper thigh to below the knee. It was a medium-sized vein of good quality. The side branches were all ligated with 4-0 silk ties.    Coronary arteries:  The coronary arteries were examined.  LAD:  intramyocardial in proximal and mid portions. Visible distally where there was minimal disease. Diagonal branches were very small and not graftable. LCX:  OM 1 intramyocardial but visible just beyond the bifurcation for a short segment. RCA:  diffusely diseased with calcific plaque and this extended out into the proximal PDA. The PDA was diffusely diseased but graftable.   Grafts: performed by me and assisted by Lowella Dandy, PA-C  LIMA to the LAD: 2.0 mm. It was sewn end to side using 8-0 prolene continuous suture. SVG to OM 1:  1.75 mm. It was sewn end to side using 7-0 prolene continuous suture. SVG to PDA:  1.6 mm. It was sewn end to side using 7-0 prolene continuous suture.   The proximal vein graft anastomoses were performed to the mid-ascending aorta using continuous 6-0  prolene suture. Graft markers were placed around the proximal anastomoses.   Completion:  The patient was rewarmed to 37 degrees Centigrade. The clamp was removed from the LIMA pedicle and there was rapid warming of the septum and return of ventricular fibrillation. The crossclamp was removed with a time of 69 minutes. There was spontaneous return of sinus rhythm. The distal and proximal anastomoses were checked for hemostasis. The position of the grafts was satisfactory. Two temporary epicardial pacing wires were placed on the right atrium and two on the right ventricle. The patient was weaned from CPB without difficulty on no inotropes. CPB time was 86 minutes. Cardiac output was 5 LPM. TEE showed normal LV systolic function. Heparin was fully reversed with protamine and the aortic and venous cannulas removed. Hemostasis was achieved. Mediastinal and left pleural drainage tubes were placed. The sternum was closed with double #6 stainless steel wires. The fascia was closed with continuous # 1 vicryl suture. The subcutaneous tissue was closed with 2-0 vicryl continuous suture. The skin was closed with 3-0 vicryl subcuticular suture. All sponge, needle, and instrument counts were reported correct at the end of the case. Dry sterile dressings were placed over the incisions and around the chest tubes which were connected to pleurevac suction. The patient was then transported to the surgical intensive care unit in but stable condition.

## 2023-01-30 NOTE — Discharge Summary (Signed)
[address]       [place] [address]             [phone]    Physician Discharge Summary  Patient ID: Jon [name] MRN: [id number] DOB/AGE: Sep 28, 1945 77 y.o.  Admit date: 01/25/2023 Discharge date: 02/09/2023  Admission Diagnoses:  Patient Active Problem List   Diagnosis Date Noted   Unstable angina (HCC) 01/25/2023   Weakness generalized 12/12/2022   STEMI involving oth coronary artery of inferior wall (HCC) 12/11/2022   STEMI involving right coronary artery (HCC) 12/11/2022   Nightmares 08/04/2022   Senile purpura (HCC) 01/17/2022   Abnormal CT scan of heart    Carotid stenosis 12/01/2021   Precordial pain 11/16/2021   TIA (transient ischemic attack) 11/16/2021   Postural dizziness with near syncope 03/08/2020   Subclavian arterial stenosis (HCC) 12/07/2019   Abnormal TSH 12/07/2019   Insomnia 10/01/2018   Pulmonary nodule 07/15/2018   Cerebrovascular disease 07/15/2018   Continuous dependence on cigarette smoking 12/02/2017   History of stroke 12/02/2017   Shortness of breath on exertion 12/02/2017   Coronary artery disease involving native coronary artery with angina pectoris (HCC) 11/18/2017   Centrilobular emphysema (HCC) 11/03/2017   H/O parotidectomy 06/02/2017   Parotid mass 05/07/2017   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) 07/30/2016   CKD stage 3 due to type 2 diabetes mellitus (HCC) 03/22/2015   Screening for prostate cancer 07/22/2011   Diabetic peripheral neuropathy (HCC) 06/05/2011   ROTATOR CUFF SYNDROME, RIGHT 03/22/2010   History of cardiovascular disorder 02/28/2010   Diabetes mellitus, type II, insulin dependent (HCC) 01/31/2010   Hyperlipidemia due to type 2 diabetes mellitus (HCC) 01/31/2010   TOBACCO ABUSE 01/31/2010   Essential hypertension 01/31/2010   Discharge Diagnoses:  Patient Active Problem List   Diagnosis Date Noted   S/P CABG x 3  02/09/2023   Unstable angina (HCC) 01/25/2023   Weakness generalized 12/12/2022   STEMI involving oth coronary artery of inferior wall (HCC) 12/11/2022   STEMI involving right coronary artery (HCC) 12/11/2022   Nightmares 08/04/2022   Senile purpura (HCC) 01/17/2022   Abnormal CT scan of heart    Carotid stenosis 12/01/2021   Precordial pain 11/16/2021   TIA (transient ischemic attack) 11/16/2021   Postural dizziness with near syncope 03/08/2020   Subclavian arterial stenosis (HCC) 12/07/2019   Abnormal TSH 12/07/2019   Insomnia 10/01/2018   Pulmonary nodule 07/15/2018   Cerebrovascular disease 07/15/2018   Continuous dependence on cigarette smoking 12/02/2017   History of stroke 12/02/2017   Shortness of breath on exertion 12/02/2017   Coronary artery disease involving native coronary artery with angina pectoris (HCC) 11/18/2017   Centrilobular emphysema (HCC) 11/03/2017   H/O parotidectomy 06/02/2017   Parotid mass 05/07/2017   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) 07/30/2016   CKD stage 3 due to type 2 diabetes mellitus (HCC) 03/22/2015   Screening for prostate cancer 07/22/2011   Diabetic peripheral neuropathy (HCC) 06/05/2011   ROTATOR CUFF SYNDROME, RIGHT 03/22/2010   History of cardiovascular disorder 02/28/2010   Diabetes mellitus, type II, insulin dependent (HCC) 01/31/2010   Hyperlipidemia due to type 2 diabetes mellitus (HCC) 01/31/2010   TOBACCO ABUSE 01/31/2010   Essential hypertension 01/31/2010   Discharged Condition: Stable  History of Present Illness:  Jon [name] is a 77 year old gentleman with a history of remote smoking, type 2 diabetes, hyperlipidemia, hypertension, stage III  chronic kidney disease, TIA and remote stroke, left subclavian artery stenosis and subclavian steal syndrome status post stenting of the proximal left subclavian artery and coronary disease.  He underwent catheterization by Dr.  Herbie Baltimore on 01/02/2022 showing left main and severe three-vessel coronary disease.  He was referred for CABG evaluation but apparently canceled that appointment and said that he was not going to have surgery. He was continued on medical therapy He presented on 12/11/2022 to [facility] with an acute STEMI.  Troponin was greater than 24,000.  Cardiac catheterization showed the culprit to be an occluded right coronary artery.  There is also about 75% mid to distal left main stenosis.  The LAD had 70% proximal and 75% mid stenosis and was diffusely irregular.  There is a large first marginal with 75% stenosis.  The RCA was successfully opened with DES.  Left ventricular ejection fraction was 45 to 50% by visual estimate with mildly elevated LVEDP.  Plans were made for dual antiplatelet therapy for 1 year and continue medical therapy with close follow-up.  He was in seeing his PCP on 9/13 and developed anginal pain with diaphoresis and generalized weakness. He stated this pain was similar to when he experienced his MI. His troponin was negative.  He was transported to the Emergency Department via EMS.  He was started on heparin and remained chest pain-free.  He requested transfer and was admitted to [facility] for further care.  Hospital Course:   The patient remained chest pain free during admission.  He was evaluated by Dr. Laneta Simmers for coronary bypass grafting procedure.  He was felt to be a candidate for surgical revascularization and the patient was agreeable to proceed.  He was taken to the operating room on 01/30/2023.  He underwent CABG x 3 utilizing LIMA to LAD, SVG to PDA, and SVG to OM.  He also underwent endoscopic harvest of greater saphenous vein from his right leg.  He tolerated the procedure and was taken to the SICU in stable condition. He was extubated early post op day 1. EPW, swan, a line, and chest tubes were removed. He has CKD (stage IIIa)-creatinine was monitored closely as he was  diuresed for volume overload. His creatinine is 1.4 He was transitioned off the Insulin drip. His pre op HGA1C was 6.3.He will be restarted on Synjardy later in his post op course. Patient then had hallucinations/confusion;trying to limit narcotics. He went into a fib with RVR and was put on Amiodarone drip. He was restarted on Plavix for recent stent to the RCA so ec asa was decreased to 81 mg daily. He had sundowning the evening of post op day 2 and was given Hadol and Trazodone was continued at bedtime. Narcotics were avoided. He was stable for transfer from the ICU to 4E for further convalescence on 09/23.  All wounds are clean, dry, healing without signs of infection. He has been tolerating a diet. He was weaned off oxygen. He had a fib/fl run early the am of 09/25. His BP was not as labile so he was started on low dose Lopressor. He had an occasional burst of a fib so we monitored the need for anticoagulation. His thrombocytopenia did resolve as last platelets were up to 192,000.  He had severe constipation and required treatment with laxative.  He was evaluated by PT/OT who recommended SNF placement.  His confusion/sundowning has shown some improvement but it has not resolved.  He did have a fall in his room but  there was no injury.  His surgical incisions are free from evidence of infection.  He is felt stable for discharge to the skilled nursing facility today.  Consults: None  Significant Diagnostic Studies:  Narrative & Impression  CLINICAL DATA:  161096 Hx of CABG [id number]   EXAM: PORTABLE CHEST 1 VIEW   COMPARISON:  January 31, 2023   FINDINGS: The cardiomediastinal silhouette is unchanged in contour.Status post median sternotomy and CABG. RIGHT IJ sheath tip terminates over the SVC. Favor trace bilateral pleural effusions. No pneumothorax. LEFT greater than RIGHT predominately platelike opacities, similar comparison to prior.   IMPRESSION: Similar appearance of LEFT greater than  RIGHT atelectasis.     Electronically Signed   By: Meda Klinefelter M.D.   On: 02/01/2023 09:01   Treatments: surgery:  Median Sternotomy Extracorporeal circulation 3.   Coronary artery bypass grafting x 3   Left internal mammary artery graft to the LAD SVG to OM 1 SVG to PDA   4.   Endoscopic vein harvest from the right leg by Dr. Laneta Simmers on 01/30/2023.    Discharge Exam: Blood pressure (!) 95/59, pulse 60, temperature 97.8 F (36.6 C), temperature source Oral, resp. rate 19, height 6\' 1"  (1.854 m), weight 89.7 kg, SpO2 92%.  General appearance: alert, cooperative, and no distress Heart: regular rate and rhythm Lungs: clear to auscultation bilaterally Abdomen: soft, non-tender; bowel sounds normal; no masses,  no organomegaly Extremities: edema trace Wound: clean and dry  Discharge Medications:  The patient has been discharged on:   1.Beta Blocker:  Yes [ x  ]                              No   [   ]                              If No, reason:  2.Ace Inhibitor/ARB: Yes [   ]                                     No  [  x  ]                                     If No, reason:Labile BP  3.Statin:   Yes [  x ]                  No  [   ]                  If No, reason:  4.Ecasa:  Yes  [  x ]                  No   [   ]                  If No, reason:  Patient had ACS upon admission:No  Plavix/P2Y12 inhibitor: Yes [   ]                                      No  [  x ]     Discharge Instructions  Amb Referral to Cardiac Rehabilitation   Complete by: As directed    Diagnosis: CABG   CABG X ___: 3   After initial evaluation and assessments completed: Virtual Based Care may be provided alone or in conjunction with Phase 2 Cardiac Rehab based on patient barriers.: Yes   Intensive Cardiac Rehabilitation (ICR) [facility] location only OR Traditional Cardiac Rehabilitation (TCR) *If criteria for ICR are not met will enroll in TCR [facility] only): Yes      Allergies as of  02/09/2023       Reactions   Gabapentin Other (See Comments)   Nightmares.    Lisinopril Other (See Comments)   Hypotension   Metformin And Related Diarrhea        Medication List     STOP taking these medications    lisinopril 2.5 MG tablet Commonly known as: ZESTRIL       TAKE these medications    Accu-Chek Guide Me w/Device Kit Check glucose three times daily. Dx. Code E11.8   Accu-Chek Guide test strip Generic drug: glucose blood Dx DM E11.9. Check fasting blood sugar every morning.   Accu-Chek Softclix Lancets lancets Use to check blood sugars twice daily   albuterol 108 (90 Base) MCG/ACT inhaler Commonly known as: VENTOLIN HFA Inhale 2 puffs into the lungs every 6 (six) hours as needed for wheezing or shortness of breath.   amiodarone 200 MG tablet Commonly known as: PACERONE Take 1 tablet (200 mg total) by mouth daily.   aspirin EC 81 MG tablet Take 1 tablet (81 mg total) by mouth daily. Swallow whole.   atorvastatin 80 MG tablet Commonly known as: LIPITOR TAKE 1 TABLET BY MOUTH AT  BEDTIME   clopidogrel 75 MG tablet Commonly known as: PLAVIX Take 1 tablet (75 mg total) by mouth daily.   metoprolol succinate 25 MG 24 hr tablet Commonly known as: TOPROL-XL Take 1 tablet (25 mg total) by mouth daily.   Semaglutide(0.25 or 0.5MG /DOS) 2 MG/3ML Sopn Inject 0.5 mg into the skin every 7 (seven) days.   Synjardy 12.5-500 MG Tabs Generic drug: Empagliflozin-metFORMIN HCl Take 1 tablet by mouth 2 (two) times daily.   traZODone 50 MG tablet Commonly known as: DESYREL Take 1 tablet (50 mg total) by mouth at bedtime as needed for sleep. What changed: See the new instructions.   Evaristo Bury FlexTouch 100 UNIT/ML FlexTouch Pen Generic drug: insulin degludec Inject 20 Units into the skin daily. What changed:  when to take this reasons to take this        Contact information for follow-up providers     [name], MD Follow up on 03/04/2023.    Specialty: Cardiothoracic Surgery Why: Appointment is at 4:30 Contact information: [address] [place] [address] [phone]         [facility] Follow up on 03/04/2023.   Why: Please arrive by 3:30 in order to have PA/LAT CXR PRIOR to office appointment with Dr. Cristy Hilts information: [address] [place] [address]        Creig Hines, NP Follow up on 02/20/2023.   Specialties: Cardiology, Radiology Why: Appointment is at 10:05 Contact information: [address] [place] [address] [phone]              Contact information for after-discharge care     Destination     [facility] .   Service: Skilled Nursing Contact information: [address]  [address] [place] [address] [phone]                     Signed:  Lowella Dandy, PA-C 02/09/2023, 1:39 PM

## 2023-01-30 NOTE — Anesthesia Procedure Notes (Addendum)
Arterial Line Insertion Start/End9/20/2024 7:15 AM, 01/30/2023 7:20 AM Performed by: Mariann Barter, MD, anesthesiologist  Patient location: OR. Preanesthetic checklist: patient identified, IV checked, site marked, risks and benefits discussed, surgical consent, monitors and equipment checked, pre-op evaluation, timeout performed and anesthesia consent Patient sedated Left, radial was placed Catheter size: 20 G Hand hygiene performed , maximum sterile barriers used  and Seldinger technique used Allen's test indicative of satisfactory collateral circulation Attempts: 3 Procedure performed using ultrasound guided technique. Ultrasound Notes:anatomy identified and image(s) printed for medical record Following insertion, line sutured, dressing applied and Biopatch. Post procedure assessment: normal  Post procedure complications: second provider assisted. Patient tolerated the procedure well with no immediate complications. Additional procedure comments: Initial attempts x2 by CRNA, good needle placement but unable to thread wire. Third attempt by anesthesiologist with U/S guidance, easy wire passage and catheter insertion. [name]

## 2023-01-30 NOTE — Anesthesia Procedure Notes (Signed)
Procedure Name: Intubation Date/Time: 01/30/2023 7:53 AM  Performed by: Garfield Cornea, CRNAPre-anesthesia Checklist: Patient identified, Emergency Drugs available, Suction available and Patient being monitored Patient Re-evaluated:Patient Re-evaluated prior to induction Oxygen Delivery Method: Circle System Utilized Preoxygenation: Pre-oxygenation with 100% oxygen Induction Type: IV induction Ventilation: Mask ventilation without difficulty and Oral airway inserted - appropriate to patient size Laryngoscope Size: Mac and 4 Grade View: Grade I Tube type: Oral Tube size: 8.0 mm Number of attempts: 1 Airway Equipment and Method: Stylet and Oral airway Placement Confirmation: ETT inserted through vocal cords under direct vision, positive ETCO2 and breath sounds checked- equal and bilateral Secured at: 23 cm Tube secured with: Tape Dental Injury: Teeth and Oropharynx as per pre-operative assessment

## 2023-01-30 NOTE — Progress Notes (Signed)
Patient ID: Jon [name], male   DOB: 12-Oct-1945, 77 y.o.   MRN: [id number]  TCTS Evening Rounds:   Hemodynamically stable  CI = 1.98  Has started to wake up on vent.   Urine output good  CT output low  CBC    Component Value Date/Time   WBC 7.9 01/30/2023 1243   RBC 2.81 (L) 01/30/2023 1243   HGB 8.8 (L) 01/30/2023 1249   HGB 14.9 02/12/2022 0941   HCT 26.0 (L) 01/30/2023 1249   HCT 43.4 02/12/2022 0941   PLT 89 (L) 01/30/2023 1243   PLT 156 02/12/2022 0941   MCV 96.4 01/30/2023 1243   MCV 93 02/12/2022 0941   MCH 32.0 01/30/2023 1243   MCHC 33.2 01/30/2023 1243   RDW 13.2 01/30/2023 1243   RDW 12.3 02/12/2022 0941   LYMPHSABS 1.9 01/25/2023 1754   LYMPHSABS 1.5 02/12/2022 0941   MONOABS 0.4 01/25/2023 1754   EOSABS 0.2 01/25/2023 1754   EOSABS 0.2 02/12/2022 0941   BASOSABS 0.0 01/25/2023 1754   BASOSABS 0.0 02/12/2022 0941     BMET    Component Value Date/Time   NA 139 01/30/2023 1249   NA 140 02/12/2022 0941   K 4.7 01/30/2023 1249   CL 104 01/30/2023 1125   CO2 22 01/29/2023 1515   GLUCOSE 119 (H) 01/30/2023 1125   BUN 14 01/30/2023 1125   BUN 15 02/12/2022 0941   CREATININE 1.20 01/30/2023 1125   CREATININE 1.38 (H) 03/20/2021 1122   CALCIUM 9.7 01/29/2023 1515   EGFR 49 (L) 02/12/2022 0941   GFRNONAA 42 (L) 01/29/2023 1515   GFRNONAA 51 (L) 09/07/2019 0033     A/P:  Stable postop course. Continue current plans. Receiving plts.

## 2023-01-30 NOTE — Anesthesia Procedure Notes (Signed)
Central Venous Catheter Insertion Performed by: Mariann Barter, MD, anesthesiologist Start/End9/20/2024 7:00 AM, 01/30/2023 7:15 AM Patient location: Pre-op. Preanesthetic checklist: patient identified, IV checked, site marked, risks and benefits discussed, surgical consent, monitors and equipment checked, pre-op evaluation, timeout performed and anesthesia consent Position: Trendelenburg Lidocaine 1% used for infiltration and patient sedated Hand hygiene performed , maximum sterile barriers used  and Seldinger technique used Catheter size: 9 Fr Central line was placed.MAC introducer Swan type:thermodilution Procedure performed using ultrasound guided technique. Ultrasound Notes:anatomy identified, needle tip was noted to be adjacent to the nerve/plexus identified, no ultrasound evidence of intravascular and/or intraneural injection and image(s) printed for medical record Attempts: 1 Following insertion, line sutured, dressing applied and Biopatch. Post procedure assessment: blood return through all ports, free fluid flow and no air

## 2023-01-30 NOTE — Interval H&P Note (Signed)
History and Physical Interval Note:  01/30/2023 6:38 AM  Jon [name]  has presented today for surgery, with the diagnosis of CAD.  The various methods of treatment have been discussed with the patient and family. After consideration of risks, benefits and other options for treatment, the patient has consented to  Procedure(s): CORONARY ARTERY BYPASS GRAFTING (CABG) (N/A) TRANSESOPHAGEAL ECHOCARDIOGRAM (N/A) as a surgical intervention.  The patient's history has been reviewed, patient examined, no change in status, stable for surgery.  I have reviewed the patient's chart and labs.  Questions were answered to the patient's satisfaction.     [name]

## 2023-01-30 NOTE — Discharge Instructions (Addendum)
Discharge Instructions:  1. You may shower, please wash incisions daily with soap and water and keep dry.  If you wish to cover wounds with dressing you may do so but please keep clean and change daily.  No tub baths or swimming until incisions have completely healed.  If your incisions become red or develop any drainage please call our office at [phone]  2. No Driving until cleared by Dr. Sharee Pimple office and you are no longer using narcotic pain medications  3. Monitor your weight daily.. Please use the same scale and weigh at same time... If you gain 5-10 lbs in 48 hours with associated lower extremity swelling, please contact our office at [phone]  4. Fever of 101.5 for at least 24 hours with no source, please contact our office at [phone]  5. Activity- up as tolerated, please walk at least 3 times per day.  Avoid strenuous activity, no lifting, pushing, or pulling with your arms over 8-10 lbs for a minimum of 6 weeks  6. If any questions or concerns arise, please do not hesitate to contact our office at [phone]

## 2023-01-30 NOTE — Progress Notes (Signed)
Pt did well during rapid wean, VC 2.3LT, NIF -30, Pt extubated to 4 lt [place]. IS 500 X 8 . Good leak, Sats 100% Tol well.

## 2023-01-30 NOTE — Transfer of Care (Signed)
Immediate Anesthesia Transfer of Care Note  Patient: Jon [name]  Procedure(s) Performed: CORONARY ARTERY BYPASS GRAFTING TIMES THREE USING LEFT INTERNAL MAMMARY ARTERY AND ENDOHARVEST OF RIGHT LEG GREATER SAPHENOUS VEIN (Chest) TRANSESOPHAGEAL ECHOCARDIOGRAM  Patient Location: ICU  Anesthesia Type:General  Level of Consciousness: sedated and Patient remains intubated per anesthesia plan  Airway & Oxygen Therapy: Patient remains intubated per anesthesia plan and Patient placed on Ventilator (see vital sign flow sheet for setting)  Post-op Assessment: Report given to RN and Post -op Vital signs reviewed and stable  Post vital signs: Reviewed and stable  Last Vitals:  Vitals Value Taken Time  BP    Temp 35.9 C 01/30/23 1247  Pulse 68 01/30/23 1247  Resp 16 01/30/23 1247  SpO2 99 % 01/30/23 1247  Vitals shown include unfiled device data.  Last Pain:  Vitals:   01/30/23 0658  TempSrc:   PainSc: 0-No pain         Complications: No notable events documented.

## 2023-01-30 NOTE — Brief Op Note (Signed)
01/25/2023 - 01/30/2023  7:16 AM  PATIENT:  Jon [name]  77 y.o. male  PRE-OPERATIVE DIAGNOSIS:  CAD  [name]-OPERATIVE DIAGNOSIS:  CAD  PROCEDURE:  Procedure(s):  CORONARY ARTERY BYPASS GRAFTING x 3 -LIMA to LAD -SVG to OM -SVG to PDA  TRANSESOPHAGEAL ECHOCARDIOGRAM (N/A)  ENDOSCOPIC HARVEST GREATER SAPHENOUS VEIN -Right leg Vein harvest time: 25 min Vein prep time: 15 min  SURGEON:  Surgeons and Role:    * [name], MD - Primary  PHYSICIAN ASSISTANT: Lowella Dandy PA-C   ASSISTANTS: Virgilio Frees RNFA   ANESTHESIA:   general  EBL:  487 mL   BLOOD ADMINISTERED:   CC CELLSAVER  DRAINS:  Left Pleural Chest Tube, Mediastinal Chest Drains    LOCAL MEDICATIONS USED:  NONE  SPECIMEN:  No Specimen  DISPOSITION OF SPECIMEN:  N/A  COUNTS:  YES  TOURNIQUET:  * No tourniquets in log *  DICTATION: .Dragon Dictation  PLAN OF CARE: Admit to inpatient   PATIENT DISPOSITION:  ICU - intubated and hemodynamically stable.   Delay start of Pharmacological VTE agent (>24hrs) due to surgical blood loss or risk of bleeding: yes

## 2023-01-31 ENCOUNTER — Encounter (HOSPITAL_COMMUNITY): Payer: Self-pay | Admitting: Surgery

## 2023-01-31 ENCOUNTER — Inpatient Hospital Stay (HOSPITAL_COMMUNITY): Payer: Medicare Other

## 2023-01-31 DIAGNOSIS — I200 Unstable angina: Secondary | ICD-10-CM | POA: Diagnosis not present

## 2023-01-31 LAB — CBC
HCT: 25.9 % — ABNORMAL LOW (ref 39.0–52.0)
HCT: 25.2 % — ABNORMAL LOW (ref 39.0–52.0)
Hemoglobin: 8.4 g/dL — ABNORMAL LOW (ref 13.0–17.0)
Hemoglobin: 8.2 g/dL — ABNORMAL LOW (ref 13.0–17.0)
MCH: 31.7 pg (ref 26.0–34.0)
MCH: 32.9 pg (ref 26.0–34.0)
MCHC: 32.4 g/dL (ref 30.0–36.0)
MCHC: 32.5 g/dL (ref 30.0–36.0)
MCV: 97.7 fL (ref 80.0–100.0)
MCV: 101.2 fL — ABNORMAL HIGH (ref 80.0–100.0)
Platelets: 118 10*3/uL — ABNORMAL LOW (ref 150–400)
Platelets: 108 10*3/uL — ABNORMAL LOW (ref 150–400)
RBC: 2.65 MIL/uL — ABNORMAL LOW (ref 4.22–5.81)
RBC: 2.49 MIL/uL — ABNORMAL LOW (ref 4.22–5.81)
RDW: 13.5 % (ref 11.5–15.5)
RDW: 13.8 % (ref 11.5–15.5)
WBC: 8.1 10*3/uL (ref 4.0–10.5)
WBC: 6.9 10*3/uL (ref 4.0–10.5)
nRBC: 0 % (ref 0.0–0.2)
nRBC: 0 % (ref 0.0–0.2)

## 2023-01-31 LAB — BASIC METABOLIC PANEL
Anion gap: 11 (ref 5–15)
Anion gap: 9 (ref 5–15)
BUN: 13 mg/dL (ref 8–23)
BUN: 15 mg/dL (ref 8–23)
CO2: 18 mmol/L — ABNORMAL LOW (ref 22–32)
CO2: 22 mmol/L (ref 22–32)
Calcium: 7.6 mg/dL — ABNORMAL LOW (ref 8.9–10.3)
Calcium: 7.8 mg/dL — ABNORMAL LOW (ref 8.9–10.3)
Chloride: 108 mmol/L (ref 98–111)
Chloride: 104 mmol/L (ref 98–111)
Creatinine, Ser: 1.44 mg/dL — ABNORMAL HIGH (ref 0.61–1.24)
Creatinine, Ser: 1.4 mg/dL — ABNORMAL HIGH (ref 0.61–1.24)
GFR, Estimated: 50 mL/min — ABNORMAL LOW (ref >60)
GFR, Estimated: 52 mL/min — ABNORMAL LOW (ref >60)
Glucose, Bld: 163 mg/dL — ABNORMAL HIGH (ref 70–99)
Glucose, Bld: 152 mg/dL — ABNORMAL HIGH (ref 70–99)
Potassium: 4.4 mmol/L (ref 3.5–5.1)
Potassium: 4.1 mmol/L (ref 3.5–5.1)
Sodium: 137 mmol/L (ref 135–145)
Sodium: 135 mmol/L (ref 135–145)

## 2023-01-31 LAB — GLUCOSE, CAPILLARY
Glucose-Capillary: 138 mg/dL — ABNORMAL HIGH (ref 70–99)
Glucose-Capillary: 144 mg/dL — ABNORMAL HIGH (ref 70–99)
Glucose-Capillary: 165 mg/dL — ABNORMAL HIGH (ref 70–99)
Glucose-Capillary: 157 mg/dL — ABNORMAL HIGH (ref 70–99)
Glucose-Capillary: 135 mg/dL — ABNORMAL HIGH (ref 70–99)
Glucose-Capillary: 183 mg/dL — ABNORMAL HIGH (ref 70–99)
Glucose-Capillary: 141 mg/dL — ABNORMAL HIGH (ref 70–99)

## 2023-01-31 LAB — MAGNESIUM
Magnesium: 2.5 mg/dL — ABNORMAL HIGH (ref 1.7–2.4)
Magnesium: 2.2 mg/dL (ref 1.7–2.4)

## 2023-01-31 LAB — PREPARE PLATELET PHERESIS: Unit division: 0

## 2023-01-31 LAB — BPAM PLATELET PHERESIS
Blood Product Expiration Date: 202409222359
ISSUE DATE / TIME: 202409201514
Unit Type and Rh: 6200

## 2023-01-31 MED ORDER — AMIODARONE LOAD VIA INFUSION
150.0000 mg | Freq: Once | INTRAVENOUS | Status: AC
  Administered 2023-01-31: 150 mg via INTRAVENOUS
  Filled 2023-01-31: qty 83.34

## 2023-01-31 MED ORDER — FUROSEMIDE 10 MG/ML IJ SOLN
40.0000 mg | Freq: Two times a day (BID) | INTRAMUSCULAR | Status: AC
  Administered 2023-01-31 (×2): 40 mg via INTRAVENOUS
  Filled 2023-01-31 (×2): qty 4

## 2023-01-31 MED ORDER — AMIODARONE HCL IN DEXTROSE 360-4.14 MG/200ML-% IV SOLN
30.0000 mg/h | INTRAVENOUS | Status: DC
  Administered 2023-02-01: 30 mg/h via INTRAVENOUS
  Filled 2023-01-31: qty 200

## 2023-01-31 MED ORDER — AMIODARONE HCL IN DEXTROSE 360-4.14 MG/200ML-% IV SOLN
60.0000 mg/h | INTRAVENOUS | Status: AC
  Administered 2023-01-31 (×2): 60 mg/h via INTRAVENOUS
  Filled 2023-01-31 (×2): qty 200

## 2023-01-31 NOTE — Progress Notes (Signed)
1 Day Post-Op Procedure(s) (LRB): CORONARY ARTERY BYPASS GRAFTING TIMES THREE USING LEFT INTERNAL MAMMARY ARTERY AND ENDOHARVEST OF RIGHT LEG GREATER SAPHENOUS VEIN (N/A) TRANSESOPHAGEAL ECHOCARDIOGRAM (N/A) Subjective: Complains of pain. Did not like dangle this am.  Objective: Vital signs in last 24 hours: Temp:  [96.3 F (35.7 C)-99.3 F (37.4 C)] 99 F (37.2 C) (09/21 0600) Pulse Rate:  [60-89] 75 (09/21 0600) Cardiac Rhythm: Normal sinus rhythm (09/21 0000) Resp:  [12-22] 18 (09/21 0600) BP: (97-133)/(50-77) 100/58 (09/21 0600) SpO2:  [98 %-100 %] 99 % (09/21 0600) Arterial Line BP: (88-152)/(47-64) 112/47 (09/21 0600) FiO2 (%):  [40 %-50 %] 40 % (09/20 2127) Weight:  [97 kg] 97 kg (09/21 0500)  Hemodynamic parameters for last 24 hours: PAP: (14-31)/(7-18) 29/13 CO:  [2.6 L/min-5.5 L/min] 5.5 L/min CI:  [1.2 L/min/m2-2.56 L/min/m2] 2.56 L/min/m2  Intake/Output from previous day: 09/20 0701 - 09/21 0700 In: 5255 [I.V.:2872.3; Blood:528.5; NG/GT:200; IV Piggyback:1654.2] Out: 3917 [Urine:2800; Blood:487; Chest Tube:630] Intake/Output this shift: No intake/output data recorded.  General appearance: alert and cooperative Neurologic: intact Heart: regular rate and rhythm, S1, S2 normal, no murmur Lungs: clear to auscultation bilaterally Extremities: edema mild Wound: dressings dry  Lab Results: Recent Labs    01/30/23 1856 01/30/23 2156 01/31/23 0215  WBC 6.6  --  8.1  HGB 8.1* 8.2* 8.4*  HCT 25.2* 24.0* 25.9*  PLT 112*  --  118*   BMET:  Recent Labs    01/30/23 1856 01/30/23 2156 01/31/23 0215  NA 138 139 137  K 4.2 4.4 4.4  CL 109  --  108  CO2 20*  --  18*  GLUCOSE 130*  --  163*  BUN 12  --  13  CREATININE 1.33*  --  1.44*  CALCIUM 7.2*  --  7.6*    PT/INR:  Recent Labs    01/30/23 1243  LABPROT 17.3*  INR 1.4*   ABG    Component Value Date/Time   PHART 7.331 (L) 01/30/2023 2156   HCO3 16.9 (L) 01/30/2023 2156   TCO2 18 (L) 01/30/2023  2156   ACIDBASEDEF 8.0 (H) 01/30/2023 2156   O2SAT 99 01/30/2023 2156   CBG (last 3)  Recent Labs    01/30/23 1854 01/30/23 2354 01/31/23 0404  GLUCAP 124* 150* 144*   CXR: clear  ECG: sinus, 1st degree AV block, old IMI.  Assessment/Plan: S/P Procedure(s) (LRB): CORONARY ARTERY BYPASS GRAFTING TIMES THREE USING LEFT INTERNAL MAMMARY ARTERY AND ENDOHARVEST OF RIGHT LEG GREATER SAPHENOUS VEIN (N/A) TRANSESOPHAGEAL ECHOCARDIOGRAM (N/A)  POD 1 Hemodynamically stable. Continue low dose Lopressor.  DC pacing wires this am and if stable in 2 hrs remove arterial line, swan, chest tubes.  Stage 3a CKD: creat stable. Start diuresis.  IS, OOB.  Will resume Plavix with ASA tomorrow for hx of RCA stent 12/11/22.   LOS: 6 days    [name] 01/31/2023

## 2023-01-31 NOTE — Plan of Care (Signed)
Problem: Education: Goal: Knowledge of General Education information will improve Description: Including pain rating scale, medication(s)/side effects and non-pharmacologic comfort measures Outcome: Progressing   Problem: Health Behavior/Discharge Planning: Goal: Ability to manage health-related needs will improve Outcome: Progressing   Problem: Clinical Measurements: Goal: Ability to maintain clinical measurements within normal limits will improve Outcome: Progressing Goal: Will remain free from infection Outcome: Progressing Goal: Diagnostic test results will improve Outcome: Progressing Goal: Respiratory complications will improve Outcome: Progressing Goal: Cardiovascular complication will be avoided Outcome: Progressing   Problem: Activity: Goal: Risk for activity intolerance will decrease Outcome: Progressing   Problem: Nutrition: Goal: Adequate nutrition will be maintained Outcome: Progressing   Problem: Coping: Goal: Level of anxiety will decrease Outcome: Progressing   Problem: Elimination: Goal: Will not experience complications related to urinary retention Outcome: Progressing   Problem: Pain Managment: Goal: General experience of comfort will improve Outcome: Progressing   Problem: Safety: Goal: Ability to remain free from injury will improve Outcome: Progressing   Problem: Skin Integrity: Goal: Risk for impaired skin integrity will decrease Outcome: Progressing   Problem: Education: Goal: Understanding of cardiac disease, CV risk reduction, and recovery process will improve Outcome: Progressing Goal: Individualized Educational Video(s) Outcome: Progressing   Problem: Activity: Goal: Ability to tolerate increased activity will improve Outcome: Progressing   Problem: Cardiac: Goal: Ability to achieve and maintain adequate cardiovascular perfusion will improve Outcome: Progressing   Problem: Health Behavior/Discharge Planning: Goal: Ability  to safely manage health-related needs after discharge will improve Outcome: Progressing   Problem: Education: Goal: Understanding of CV disease, CV risk reduction, and recovery process will improve Outcome: Progressing Goal: Individualized Educational Video(s) Outcome: Progressing   Problem: Activity: Goal: Ability to return to baseline activity level will improve Outcome: Progressing   Problem: Cardiovascular: Goal: Ability to achieve and maintain adequate cardiovascular perfusion will improve Outcome: Progressing Goal: Vascular access site(s) Level 0-1 will be maintained Outcome: Progressing   Problem: Health Behavior/Discharge Planning: Goal: Ability to safely manage health-related needs after discharge will improve Outcome: Progressing   Problem: Education: Goal: Ability to describe self-care measures that may prevent or decrease complications (Diabetes Survival Skills Education) will improve Outcome: Progressing Goal: Individualized Educational Video(s) Outcome: Progressing   Problem: Coping: Goal: Ability to adjust to condition or change in health will improve Outcome: Progressing   Problem: Fluid Volume: Goal: Ability to maintain a balanced intake and output will improve Outcome: Progressing   Problem: Health Behavior/Discharge Planning: Goal: Ability to identify and utilize available resources and services will improve Outcome: Progressing Goal: Ability to manage health-related needs will improve Outcome: Progressing   Problem: Metabolic: Goal: Ability to maintain appropriate glucose levels will improve Outcome: Progressing   Problem: Nutritional: Goal: Maintenance of adequate nutrition will improve Outcome: Progressing Goal: Progress toward achieving an optimal weight will improve Outcome: Progressing   Problem: Skin Integrity: Goal: Risk for impaired skin integrity will decrease Outcome: Progressing   Problem: Tissue Perfusion: Goal: Adequacy of  tissue perfusion will improve Outcome: Progressing   Problem: Education: Goal: Will demonstrate proper wound care and an understanding of methods to prevent future damage Outcome: Progressing Goal: Knowledge of disease or condition will improve Outcome: Progressing Goal: Knowledge of the prescribed therapeutic regimen will improve Outcome: Progressing Goal: Individualized Educational Video(s) Outcome: Progressing   Problem: Activity: Goal: Risk for activity intolerance will decrease Outcome: Progressing   Problem: Cardiac: Goal: Will achieve and/or maintain hemodynamic stability Outcome: Progressing   Problem: Clinical Measurements: Goal: Postoperative complications will be avoided or minimized  Outcome: Progressing   Problem: Respiratory: Goal: Respiratory status will improve Outcome: Progressing

## 2023-01-31 NOTE — Progress Notes (Signed)
   Patient Name: Jon [name] Date of Encounter: 01/31/2023 [facility] Cardiologist: [name], MD   Interval Summary  .    No specific complaints.  Vital Signs .    Vitals:   01/31/23 0500 01/31/23 0600 01/31/23 0746 01/31/23 0839  BP: 133/61 (!) 100/58  113/64  Pulse: 82 75  79  Resp: 20 18    Temp: 99.3 F (37.4 C) 99 F (37.2 C) 99.7 F (37.6 C)   TempSrc:      SpO2: 99% 99%    Weight: 97 kg     Height:        Intake/Output Summary (Last 24 hours) at 01/31/2023 0853 Last data filed at 01/31/2023 0800 Gross per 24 hour  Intake 5254.95 ml  Output 3937 ml  Net 1317.95 ml      01/31/2023    5:00 AM 01/30/2023    4:52 AM 01/29/2023    4:12 AM  Last 3 Weights  Weight (lbs) 213 lb 13.5 oz 199 lb 11.8 oz 199 lb 11.8 oz  Weight (kg) 97 kg 90.6 kg 90.6 kg      Telemetry/ECG    Sinus rhythm- Personally Reviewed  Physical Exam .   GEN: No acute distress.   Neck: No JVD Cardiac: RRR, no murmurs, rubs, or gallops.  Respiratory: Clear to auscultation bilaterally. GI: Soft, nontender, non-distended  MS: No edema  Assessment & Plan .     77 year old post CABG/unstable angina/acute inferior MI in August 2024 with RCA stent placement transferred here for CABG, now post op.   Plavix washout took place prior to surgery.  -Pacing wires being removed this morning than arterial line then chest tubes -Resuming Plavix and aspirin tomorrow with prior history of RCA stent placed on 12/11/2022  Chronic kidney disease stage IIIa - Creatinine has been stable.  Lasix 40 mg IV twice daily has been started.  Baseline creatinine 1.5  Hyperlipidemia - Atorvastatin 80 mg a day.  LDL goal less than 55.    For questions or updates, please contact [facility] Please consult [email or web address] for contact info under        Signed, Donato Schultz, MD

## 2023-01-31 NOTE — Procedures (Signed)
Extubation Procedure Note  Patient Details:   Name: Jon [name] DOB: 1946/02/09 MRN: [id number]   Airway Documentation:  Airway 8 mm (Active)  Secured at (cm) 23 cm 01/30/23 2000  Measured From Lips 01/30/23 2000  Secured Location Right 01/30/23 2000  Secured By [employer] 01/30/23 2000  Tube Holder Repositioned Yes 01/30/23 2000  Prone position No 01/30/23 2000  Cuff Pressure (cm H2O) Red OR 40-60 CmH2O 01/30/23 2000  Site Condition Cool;Dry 01/30/23 2000   Vent end date: 01/30/23 Vent end time: 2215   Evaluation  O2 sats: stable throughout Complications: No apparent complications Patient did tolerate procedure well. Bilateral Breath Sounds: Clear, Diminished   Yes  Rushie Chestnut [facility] 01/31/2023, 12:12 AM

## 2023-01-31 NOTE — Progress Notes (Signed)
Patient ID: Jon [name], male   DOB: 24-Dec-1945, 77 y.o.   MRN: [id number]  TCTS Evening Rounds:  Hemodynamically stable in sinus rhythm.  Diuresing well.  Pacing wires are out. Planning chest tube removal when back to bed.  PM labs pending.

## 2023-02-01 ENCOUNTER — Inpatient Hospital Stay (HOSPITAL_COMMUNITY): Payer: Medicare Other

## 2023-02-01 DIAGNOSIS — I200 Unstable angina: Secondary | ICD-10-CM | POA: Diagnosis not present

## 2023-02-01 LAB — CBC
HCT: 23.9 % — ABNORMAL LOW (ref 39.0–52.0)
Hemoglobin: 7.7 g/dL — ABNORMAL LOW (ref 13.0–17.0)
MCH: 32.6 pg (ref 26.0–34.0)
MCHC: 32.2 g/dL (ref 30.0–36.0)
MCV: 101.3 fL — ABNORMAL HIGH (ref 80.0–100.0)
Platelets: 97 10*3/uL — ABNORMAL LOW (ref 150–400)
RBC: 2.36 MIL/uL — ABNORMAL LOW (ref 4.22–5.81)
RDW: 13.9 % (ref 11.5–15.5)
WBC: 5.7 10*3/uL (ref 4.0–10.5)
nRBC: 0 % (ref 0.0–0.2)

## 2023-02-01 LAB — BASIC METABOLIC PANEL
Anion gap: 9 (ref 5–15)
BUN: 15 mg/dL (ref 8–23)
CO2: 24 mmol/L (ref 22–32)
Calcium: 7.8 mg/dL — ABNORMAL LOW (ref 8.9–10.3)
Chloride: 102 mmol/L (ref 98–111)
Creatinine, Ser: 1.38 mg/dL — ABNORMAL HIGH (ref 0.61–1.24)
GFR, Estimated: 53 mL/min — ABNORMAL LOW (ref >60)
Glucose, Bld: 150 mg/dL — ABNORMAL HIGH (ref 70–99)
Potassium: 3.8 mmol/L (ref 3.5–5.1)
Sodium: 135 mmol/L (ref 135–145)

## 2023-02-01 LAB — GLUCOSE, CAPILLARY
Glucose-Capillary: 130 mg/dL — ABNORMAL HIGH (ref 70–99)
Glucose-Capillary: 153 mg/dL — ABNORMAL HIGH (ref 70–99)
Glucose-Capillary: 131 mg/dL — ABNORMAL HIGH (ref 70–99)
Glucose-Capillary: 147 mg/dL — ABNORMAL HIGH (ref 70–99)
Glucose-Capillary: 150 mg/dL — ABNORMAL HIGH (ref 70–99)
Glucose-Capillary: 163 mg/dL — ABNORMAL HIGH (ref 70–99)

## 2023-02-01 MED ORDER — ASPIRIN 81 MG PO CHEW: 81.0000 mg | CHEWABLE_TABLET | Freq: Every day | ORAL | Status: DC

## 2023-02-01 MED ORDER — ASPIRIN 81 MG PO TBEC
81.0000 mg | DELAYED_RELEASE_TABLET | Freq: Every day | ORAL | Status: DC
  Administered 2023-02-01 – 2023-02-02 (×2): 81 mg via ORAL
  Filled 2023-02-01 (×2): qty 1

## 2023-02-01 MED ORDER — POTASSIUM CHLORIDE CRYS ER 20 MEQ PO TBCR
20.0000 meq | EXTENDED_RELEASE_TABLET | Freq: Three times a day (TID) | ORAL | Status: DC
  Administered 2023-02-01 (×2): 20 meq via ORAL
  Filled 2023-02-01 (×3): qty 1

## 2023-02-01 MED ORDER — AMIODARONE HCL 200 MG PO TABS
400.0000 mg | ORAL_TABLET | Freq: Two times a day (BID) | ORAL | Status: DC
  Administered 2023-02-01 – 2023-02-07 (×14): 400 mg via ORAL
  Filled 2023-02-01 (×16): qty 2

## 2023-02-01 MED ORDER — TORSEMIDE 20 MG PO TABS
40.0000 mg | ORAL_TABLET | Freq: Once | ORAL | Status: AC
  Administered 2023-02-01: 40 mg via ORAL
  Filled 2023-02-01: qty 2

## 2023-02-01 MED ORDER — HALOPERIDOL LACTATE 5 MG/ML IJ SOLN: 5.0000 mg | Freq: Once | INTRAMUSCULAR | Status: AC

## 2023-02-01 MED ORDER — FE FUM-VIT C-VIT B12-FA 460-60-0.01-1 MG PO CAPS
1.0000 | ORAL_CAPSULE | Freq: Every day | ORAL | Status: DC
  Administered 2023-02-01 – 2023-02-09 (×9): 1 via ORAL
  Filled 2023-02-01 (×9): qty 1

## 2023-02-01 MED ORDER — HALOPERIDOL LACTATE 5 MG/ML IJ SOLN
INTRAMUSCULAR | Status: AC
  Administered 2023-02-01: 5 mg via INTRAVENOUS
  Filled 2023-02-01: qty 1

## 2023-02-01 MED ORDER — CLOPIDOGREL BISULFATE 75 MG PO TABS
75.0000 mg | ORAL_TABLET | Freq: Every day | ORAL | Status: DC
  Administered 2023-02-01 – 2023-02-09 (×9): 75 mg via ORAL
  Filled 2023-02-01 (×9): qty 1

## 2023-02-01 MED ORDER — INSULIN ASPART 100 UNIT/ML IJ SOLN: 0.0000 [IU] | Freq: Three times a day (TID) | INTRAMUSCULAR | Status: DC

## 2023-02-01 MED ORDER — TRAMADOL HCL 50 MG PO TABS: 50.0000 mg | ORAL_TABLET | ORAL | Status: DC | PRN

## 2023-02-01 MED ORDER — INSULIN ASPART 100 UNIT/ML IJ SOLN
0.0000 [IU] | Freq: Three times a day (TID) | INTRAMUSCULAR | Status: DC
  Administered 2023-02-01 – 2023-02-02 (×5): 2 [IU] via SUBCUTANEOUS

## 2023-02-01 NOTE — Progress Notes (Signed)
2 Days Post-Op Procedure(s) (LRB): CORONARY ARTERY BYPASS GRAFTING TIMES THREE USING LEFT INTERNAL MAMMARY ARTERY AND ENDOHARVEST OF RIGHT LEG GREATER SAPHENOUS VEIN (N/A) TRANSESOPHAGEAL ECHOCARDIOGRAM (N/A) Subjective:  Nurse reports he was confused and hallucinating overnight.  He slept well though.  Went into atrial fib with RVR overnight and started on IV amio with conversion.  He denies any pain this am.  Objective: Vital signs in last 24 hours: Temp:  [97.8 F (36.6 C)-99.7 F (37.6 C)] 97.8 F (36.6 C) (09/21 2316) Pulse Rate:  [62-105] 68 (09/22 0500) Cardiac Rhythm: Normal sinus rhythm (09/21 0800) Resp:  [10-24] 15 (09/22 0600) BP: (90-137)/(49-107) 137/61 (09/22 0600) SpO2:  [77 %-100 %] 98 % (09/22 0500) Arterial Line BP: (125-157)/(48-59) 157/59 (09/21 0900)  Hemodynamic parameters for last 24 hours: PAP: (27-29)/(11-12) 29/12  Intake/Output from previous day: 09/21 0701 - 09/22 0700 In: 846.6 [I.V.:572.7; IV Piggyback:273.9] Out: 3210 [Urine:3080; Chest Tube:130] Intake/Output this shift: No intake/output data recorded.  General appearance: alert and cooperative Neurologic: intact Heart: regular rate and rhythm, S1, S2 normal, no murmur Lungs: clear to auscultation bilaterally Extremities: edema mild Wound: chest dressing dry  Lab Results: Recent Labs    01/31/23 1637 02/01/23 0236  WBC 6.9 5.7  HGB 8.2* 7.7*  HCT 25.2* 23.9*  PLT 108* 97*   BMET:  Recent Labs    01/31/23 1637 02/01/23 0236  NA 135 135  K 4.1 3.8  CL 104 102  CO2 22 24  GLUCOSE 152* 150*  BUN 15 15  CREATININE 1.40* 1.38*  CALCIUM 7.8* 7.8*    PT/INR:  Recent Labs    01/30/23 1243  LABPROT 17.3*  INR 1.4*   ABG    Component Value Date/Time   PHART 7.331 (L) 01/30/2023 2156   HCO3 16.9 (L) 01/30/2023 2156   TCO2 18 (L) 01/30/2023 2156   ACIDBASEDEF 8.0 (H) 01/30/2023 2156   O2SAT 99 01/30/2023 2156   CBG (last 3)  Recent Labs    01/31/23 1950  01/31/23 2314 02/01/23 0324  GLUCAP 183* 141* 130*   CXR: minimal bibasilar atelectasis.   Assessment/Plan: S/P Procedure(s) (LRB): CORONARY ARTERY BYPASS GRAFTING TIMES THREE USING LEFT INTERNAL MAMMARY ARTERY AND ENDOHARVEST OF RIGHT LEG GREATER SAPHENOUS VEIN (N/A) TRANSESOPHAGEAL ECHOCARDIOGRAM (N/A)  POD 2 Hemodynamically stable   Postop atrial fib converted with amio. Transition to po today.  -2588 cc yesterday. Continue diuresis today.  Stage 3a CKD: creat stable  Resume Plavix for recent stent to RCA. Decrease ASA to 81 mg.  Glucose under good control. Switch to [facility] sliding scale.  DC foley.  Continue IS, ambulation.  Expected postop anemia: start iron.  LOS: 7 days    Jon [name] 02/01/2023

## 2023-02-01 NOTE — Progress Notes (Signed)
   Patient Name: Jon [name] Date of Encounter: 02/01/2023 [facility] Cardiologist: [name], MD   Interval Summary  .    A-fib RVR on 01/21/2023 evening-IV amiodarone, conversion Confusion noted  Vital Signs .    Vitals:   02/01/23 0500 02/01/23 0600 02/01/23 0700 02/01/23 0730  BP: (!) 110/56 137/61 119/61 98/66  Pulse: 68   74  Resp: 16 15 16  (!) 21  Temp:    98.7 F (37.1 C)  TempSrc:    Oral  SpO2: 98%   99%  Weight:      Height:        Intake/Output Summary (Last 24 hours) at 02/01/2023 0820 Last data filed at 02/01/2023 0700 Gross per 24 hour  Intake 846.63 ml  Output 3275 ml  Net -2428.37 ml      01/31/2023    5:00 AM 01/30/2023    4:52 AM 01/29/2023    4:12 AM  Last 3 Weights  Weight (lbs) 213 lb 13.5 oz 199 lb 11.8 oz 199 lb 11.8 oz  Weight (kg) 97 kg 90.6 kg 90.6 kg      Telemetry/ECG    Brief atrial fibrillation, currently sinus rhythm- Personally Reviewed  Physical Exam .   GEN: No acute distress.   Neck: No JVD Cardiac: RRR, no murmurs, rubs, or gallops.  Respiratory: Clear to auscultation bilaterally. GI: Soft, nontender, non-distended  MS: No edema  Assessment & Plan .     77 year old with acute inferior MI in August 2024 with RCA stent placement transferred here for CABG  Coronary artery disease/CABG - Goal-directed medical therapy. - Plavix has been resumed secondary to recent stent to the RCA.  Aspirin decreased to 81 mg. -Plavix washout took place prior to surgery.  Postoperative atrial fibrillation - Converted with amiodarone.  Currently on 400 mg p.o. twice daily.  Hyperlipidemia - On atorvastatin 80 mg a day with LDL goal less than 55  Chronic kidney disease stage IIIa - Baseline creatinine approximately 1.5, today 1.38 - Torsemide 40 mg once ordered, excellent diuresis of 3.2 L out yesterday.      For questions or updates, please contact [facility] Please consult [email or web address] for  contact info under        Signed, Donato Schultz, MD

## 2023-02-01 NOTE — Progress Notes (Signed)
Patient ID: Jon [name], male   DOB: [birth year], 77 y.o.   MRN: [id number] TCTS Evening Rounds:  Some orthostasis today probably related to big diuresis. -2200 cc so far after 40 mg Demedex. Will hold Lopressor tonight.  Up in chair. Says he feels better this afternoon. Daughter is with him.

## 2023-02-01 NOTE — Plan of Care (Signed)
Problem: Education: Goal: Knowledge of General Education information will improve Description: Including pain rating scale, medication(s)/side effects and non-pharmacologic comfort measures Outcome: Progressing   Problem: Health Behavior/Discharge Planning: Goal: Ability to manage health-related needs will improve Outcome: Progressing   Problem: Clinical Measurements: Goal: Ability to maintain clinical measurements within normal limits will improve Outcome: Progressing Goal: Will remain free from infection Outcome: Progressing Goal: Diagnostic test results will improve Outcome: Progressing Goal: Respiratory complications will improve Outcome: Progressing Goal: Cardiovascular complication will be avoided Outcome: Progressing   Problem: Activity: Goal: Risk for activity intolerance will decrease Outcome: Progressing   Problem: Nutrition: Goal: Adequate nutrition will be maintained Outcome: Progressing   Problem: Coping: Goal: Level of anxiety will decrease Outcome: Progressing   Problem: Elimination: Goal: Will not experience complications related to bowel motility Outcome: Progressing Goal: Will not experience complications related to urinary retention Outcome: Progressing   Problem: Pain Managment: Goal: General experience of comfort will improve Outcome: Progressing   Problem: Safety: Goal: Ability to remain free from injury will improve Outcome: Progressing   Problem: Skin Integrity: Goal: Risk for impaired skin integrity will decrease Outcome: Progressing   Problem: Education: Goal: Understanding of cardiac disease, CV risk reduction, and recovery process will improve Outcome: Progressing Goal: Individualized Educational Video(s) Outcome: Progressing   Problem: Activity: Goal: Ability to tolerate increased activity will improve Outcome: Progressing   Problem: Cardiac: Goal: Ability to achieve and maintain adequate cardiovascular perfusion will  improve Outcome: Progressing   Problem: Health Behavior/Discharge Planning: Goal: Ability to safely manage health-related needs after discharge will improve Outcome: Progressing   Problem: Education: Goal: Understanding of CV disease, CV risk reduction, and recovery process will improve Outcome: Progressing Goal: Individualized Educational Video(s) Outcome: Progressing   Problem: Activity: Goal: Ability to return to baseline activity level will improve Outcome: Progressing   Problem: Cardiovascular: Goal: Ability to achieve and maintain adequate cardiovascular perfusion will improve Outcome: Progressing Goal: Vascular access site(s) Level 0-1 will be maintained Outcome: Progressing   Problem: Health Behavior/Discharge Planning: Goal: Ability to safely manage health-related needs after discharge will improve Outcome: Progressing   Problem: Education: Goal: Ability to describe self-care measures that may prevent or decrease complications (Diabetes Survival Skills Education) will improve Outcome: Progressing Goal: Individualized Educational Video(s) Outcome: Progressing   Problem: Coping: Goal: Ability to adjust to condition or change in health will improve Outcome: Progressing   Problem: Fluid Volume: Goal: Ability to maintain a balanced intake and output will improve Outcome: Progressing   Problem: Health Behavior/Discharge Planning: Goal: Ability to identify and utilize available resources and services will improve Outcome: Progressing Goal: Ability to manage health-related needs will improve Outcome: Progressing   Problem: Metabolic: Goal: Ability to maintain appropriate glucose levels will improve Outcome: Progressing   Problem: Nutritional: Goal: Maintenance of adequate nutrition will improve Outcome: Progressing Goal: Progress toward achieving an optimal weight will improve Outcome: Progressing   Problem: Skin Integrity: Goal: Risk for impaired skin  integrity will decrease Outcome: Progressing   Problem: Tissue Perfusion: Goal: Adequacy of tissue perfusion will improve Outcome: Progressing   Problem: Education: Goal: Will demonstrate proper wound care and an understanding of methods to prevent future damage Outcome: Progressing Goal: Knowledge of disease or condition will improve Outcome: Progressing Goal: Knowledge of the prescribed therapeutic regimen will improve Outcome: Progressing Goal: Individualized Educational Video(s) Outcome: Progressing   Problem: Activity: Goal: Risk for activity intolerance will decrease Outcome: Progressing   Problem: Cardiac: Goal: Will achieve and/or maintain hemodynamic stability Outcome: Progressing  Problem: Clinical Measurements: Goal: Postoperative complications will be avoided or minimized Outcome: Progressing   Problem: Respiratory: Goal: Respiratory status will improve Outcome: Progressing

## 2023-02-01 NOTE — Progress Notes (Signed)
Patient became delirious after waking up, disoriented x4 and attempted to get out of bed at 0500. Bed alarm was active and alerted myself and other nurses to enter the room. Patient showed signs of paranoia, distress and agitation.  He repeatedly pulled his pulse ox off, attempted to pull central line and foley catheter. Reorientation attempts were unsuccessful, after 45 minutes patient agreed to lay back down in bed but refused any ambulation or transfer to chair.   Attempted to call son Jon [name]) to assist in orientation and assurance for patient but unable to contact him.  Patient now resting in bed with bed alarm active.

## 2023-02-02 ENCOUNTER — Encounter: Payer: Medicare Other | Admitting: Physical Therapy

## 2023-02-02 DIAGNOSIS — I200 Unstable angina: Secondary | ICD-10-CM | POA: Diagnosis not present

## 2023-02-02 LAB — GLUCOSE, CAPILLARY
Glucose-Capillary: 138 mg/dL — ABNORMAL HIGH (ref 70–99)
Glucose-Capillary: 150 mg/dL — ABNORMAL HIGH (ref 70–99)
Glucose-Capillary: 113 mg/dL — ABNORMAL HIGH (ref 70–99)
Glucose-Capillary: 177 mg/dL — ABNORMAL HIGH (ref 70–99)

## 2023-02-02 LAB — BASIC METABOLIC PANEL
Anion gap: 10 (ref 5–15)
BUN: 22 mg/dL (ref 8–23)
CO2: 21 mmol/L — ABNORMAL LOW (ref 22–32)
Calcium: 8.3 mg/dL — ABNORMAL LOW (ref 8.9–10.3)
Chloride: 105 mmol/L (ref 98–111)
Creatinine, Ser: 1.49 mg/dL — ABNORMAL HIGH (ref 0.61–1.24)
GFR, Estimated: 48 mL/min — ABNORMAL LOW (ref >60)
Glucose, Bld: 155 mg/dL — ABNORMAL HIGH (ref 70–99)
Potassium: 4.2 mmol/L (ref 3.5–5.1)
Sodium: 136 mmol/L (ref 135–145)

## 2023-02-02 LAB — CBC
HCT: 27.2 % — ABNORMAL LOW (ref 39.0–52.0)
Hemoglobin: 8.8 g/dL — ABNORMAL LOW (ref 13.0–17.0)
MCH: 31.8 pg (ref 26.0–34.0)
MCHC: 32.4 g/dL (ref 30.0–36.0)
MCV: 98.2 fL (ref 80.0–100.0)
Platelets: 107 10*3/uL — ABNORMAL LOW (ref 150–400)
RBC: 2.77 MIL/uL — ABNORMAL LOW (ref 4.22–5.81)
RDW: 13.8 % (ref 11.5–15.5)
WBC: 6.8 10*3/uL (ref 4.0–10.5)
nRBC: 0 % (ref 0.0–0.2)

## 2023-02-02 MED ORDER — ORAL CARE MOUTH RINSE: 15.0000 mL | OROMUCOSAL | Status: DC | PRN

## 2023-02-02 MED ORDER — ONDANSETRON HCL 4 MG/2ML IJ SOLN: 4.0000 mg | Freq: Four times a day (QID) | INTRAMUSCULAR | Status: DC | PRN

## 2023-02-02 MED ORDER — SODIUM CHLORIDE 0.9% FLUSH
3.0000 mL | Freq: Two times a day (BID) | INTRAVENOUS | Status: DC
  Administered 2023-02-02 – 2023-02-09 (×14): 3 mL via INTRAVENOUS

## 2023-02-02 MED ORDER — SODIUM CHLORIDE 0.9 % IV SOLN: 250.0000 mL | INTRAVENOUS | Status: DC | PRN

## 2023-02-02 MED ORDER — ~~LOC~~ CARDIAC SURGERY, PATIENT & FAMILY EDUCATION: Freq: Once | Status: AC

## 2023-02-02 MED ORDER — ONDANSETRON HCL 4 MG PO TABS: 4.0000 mg | ORAL_TABLET | Freq: Four times a day (QID) | ORAL | Status: DC | PRN

## 2023-02-02 MED ORDER — CLOPIDOGREL BISULFATE 75 MG PO TABS: 75.0000 mg | ORAL_TABLET | Freq: Every day | ORAL | Status: DC

## 2023-02-02 MED ORDER — ASPIRIN 81 MG PO TBEC
81.0000 mg | DELAYED_RELEASE_TABLET | Freq: Every day | ORAL | Status: DC
  Administered 2023-02-03 – 2023-02-09 (×7): 81 mg via ORAL
  Filled 2023-02-02 (×7): qty 1

## 2023-02-02 MED ORDER — LACTULOSE 10 GM/15ML PO SOLN: 20.0000 g | Freq: Every day | ORAL | Status: DC | PRN

## 2023-02-02 MED ORDER — SODIUM CHLORIDE 0.9% FLUSH: 3.0000 mL | INTRAVENOUS | Status: DC | PRN

## 2023-02-02 MED ORDER — INSULIN ASPART 100 UNIT/ML IJ SOLN
0.0000 [IU] | Freq: Three times a day (TID) | INTRAMUSCULAR | Status: DC
  Administered 2023-02-02: 4 [IU] via SUBCUTANEOUS
  Administered 2023-02-03 – 2023-02-05 (×5): 2 [IU] via SUBCUTANEOUS
  Administered 2023-02-05: 4 [IU] via SUBCUTANEOUS
  Administered 2023-02-05 – 2023-02-07 (×5): 2 [IU] via SUBCUTANEOUS
  Administered 2023-02-07: 8 [IU] via SUBCUTANEOUS
  Administered 2023-02-07 – 2023-02-08 (×3): 2 [IU] via SUBCUTANEOUS
  Administered 2023-02-08: 8 [IU] via SUBCUTANEOUS
  Administered 2023-02-08: 2 [IU] via SUBCUTANEOUS
  Administered 2023-02-09 (×2): 4 [IU] via SUBCUTANEOUS

## 2023-02-02 NOTE — Progress Notes (Incomplete)
Delirium precautions in place. Blinds closed, room made dark.

## 2023-02-02 NOTE — Progress Notes (Signed)
1815 patient transferred from 2 heart in [facility] moderate assist to chair patient alertx4 able to make needs known will repeat same questions forgetful.

## 2023-02-02 NOTE — Progress Notes (Signed)
3 Days Post-Op Procedure(s) (LRB): CORONARY ARTERY BYPASS GRAFTING TIMES THREE USING LEFT INTERNAL MAMMARY ARTERY AND ENDOHARVEST OF RIGHT LEG GREATER SAPHENOUS VEIN (N/A) TRANSESOPHAGEAL ECHOCARDIOGRAM (N/A) Subjective:  Sundowning last night with confusion and agitation. Required a dose of Haldol which settled him down for a few hrs.  Objective: Vital signs in last 24 hours: Temp:  [98 F (36.7 C)-98.9 F (37.2 C)] 98.7 F (37.1 C) (09/22 2323) Pulse Rate:  [67-86] 79 (09/23 0600) Cardiac Rhythm: Normal sinus rhythm (09/22 2000) Resp:  [13-30] 17 (09/23 0600) BP: (89-124)/(49-110) 101/63 (09/23 0400) SpO2:  [76 %-100 %] 97 % (09/23 0600)  Hemodynamic parameters for last 24 hours:    Intake/Output from previous day: 09/22 0701 - 09/23 0700 In: 65.3 [IV Piggyback:65.3] Out: 3055 [Urine:3055] Intake/Output this shift: No intake/output data recorded.  General appearance: alert and cooperative Neurologic: intact Heart: regular rate and rhythm Lungs: clear to auscultation bilaterally Extremities: no edema Wound: dressings dry  Lab Results: Recent Labs    01/31/23 1637 02/01/23 0236  WBC 6.9 5.7  HGB 8.2* 7.7*  HCT 25.2* 23.9*  PLT 108* 97*   BMET:  Recent Labs    01/31/23 1637 02/01/23 0236  NA 135 135  K 4.1 3.8  CL 104 102  CO2 22 24  GLUCOSE 152* 150*  BUN 15 15  CREATININE 1.40* 1.38*  CALCIUM 7.8* 7.8*    PT/INR:  Recent Labs    01/30/23 1243  LABPROT 17.3*  INR 1.4*   ABG    Component Value Date/Time   PHART 7.331 (L) 01/30/2023 2156   HCO3 16.9 (L) 01/30/2023 2156   TCO2 18 (L) 01/30/2023 2156   ACIDBASEDEF 8.0 (H) 01/30/2023 2156   O2SAT 99 01/30/2023 2156   CBG (last 3)  Recent Labs    02/01/23 1725 02/01/23 1950 02/01/23 2326  GLUCAP 147* 150* 163*    Assessment/Plan: S/P Procedure(s) (LRB): CORONARY ARTERY BYPASS GRAFTING TIMES THREE USING LEFT INTERNAL MAMMARY ARTERY AND ENDOHARVEST OF RIGHT LEG GREATER SAPHENOUS VEIN  (N/A) TRANSESOPHAGEAL ECHOCARDIOGRAM (N/A)  Hemodynamically stable in sinus rhythm on oral amio. Lopressor held yesterday due to orthostasis probably from diuresis. Will resume Lopressor tomorrow if BP ok.  -3894 cc yesterday. No wt yet today but should be back to baseline. Hold any further diuresis. Lytes pending this am.  DM: glucose under adequate control on SSI. Not eating reliably yet.  Nighttime delirium: he has some of this at home at times with bad dreams. Continue trazodone at hs, avoid narcotics. Continue Tylenol. He denies any pain.  Will transfer to 4E since ICU is likely to exacerbate nighttime delirium.   LOS: 8 days    [name] 02/02/2023

## 2023-02-02 NOTE — Evaluation (Signed)
Physical Therapy Evaluation Patient Details Name: Jon [name] MRN: [id number] DOB: 05/15/1945 Today's Date: 02/02/2023  History of Present Illness  Jon [name] is a 76 y.o. male s/p CABGx3 on 9/20.  PMH: multivessel CAD with prior recommendation for CABG evaluation with recent inferior ST elevation MI status post PCI/DES to the RCA in 12/2022, left subclavian artery stenosis with associated subclavian steal status post stenting in 12/2021, remote CVA/TIA, DM2, HTN, HLD, prior tobacco use with likely emphysema   Clinical Impression  PTA pt was living alone and indep for all mobility, ADLs, and drove. Pt now presenting with 4/4 DOE limiting ambulation tolerance, requires assist for all transfers and ADLs, and demo's impaired memory and has been noted to have delerium and hallucinations at night. Spoke with daughter regarding d/c recommendations. Pt's daughter and son both work during the day and aware pt is unsafe to return home alone at this time. Pt to benefit from inpatient rehab program <3hrs /day to address below deficits prior to returning home alone or with family. Acute PT to cont to follow.        If plan is discharge home, recommend the following: A lot of help with walking and/or transfers;A lot of help with bathing/dressing/bathroom;Assist for transportation;Help with stairs or ramp for entrance;Assistance with cooking/housework   Can travel by private vehicle   No    Equipment Recommendations  (TBD at next facility)  Recommendations for Other Services       Functional Status Assessment Patient has had a recent decline in their functional status and demonstrates the ability to make significant improvements in function in a reasonable and predictable amount of time.     Precautions / Restrictions Precautions Precautions: Fall;Sternal Precaution Booklet Issued: No Precaution Comments: verbally went over, pt able to recall at end of session Restrictions Weight  Bearing Restrictions: Yes (sternal prec) RUE Weight Bearing: Non weight bearing LUE Weight Bearing: Non weight bearing      Mobility  Bed Mobility Overal bed mobility: Needs Assistance Bed Mobility: Supine to Sit     Supine to sit: Mod assist, HOB elevated     General bed mobility comments: pt impulsive trying to get up with bed rail up, max directional verbal cues, pt with decreased attn vs STM on task at hand requiring step by step verbal sequencing commands    Transfers Overall transfer level: Needs assistance Equipment used: Rolling walker (2 wheels) Transfers: Sit to/from Stand Sit to Stand: Mod assist, +2 safety/equipment           General transfer comment: max directional verbal cues to use rocking momentum and to squeeze heart pillow to impede pushing up with UEs, modA to power up and steady    Ambulation/Gait Ambulation/Gait assistance: Mod assist, +2 safety/equipment Gait Distance (Feet): 20 Feet (x1, 10x1) Assistive device: Rolling walker (2 wheels) Gait Pattern/deviations: Step-to pattern, Decreased stride length, Trunk flexed, Narrow base of support, Drifts right/left Gait velocity: slow Gait velocity interpretation: <1.31 ft/sec, indicative of household ambulator   General Gait Details: pt very deconditioned with DOE limiting ambulation tolerance and impulsively trying to sit stating "i'm running out of wind"  Stairs            Wheelchair Mobility     Tilt Bed    Modified Rankin (Stroke Patients Only)       Balance Overall balance assessment: Needs assistance Sitting-balance support: Feet supported, No upper extremity supported Sitting balance-Leahy Scale: Fair     Standing balance support: Bilateral  upper extremity supported, During functional activity, Reliant on assistive device for balance Standing balance-Leahy Scale: Poor Standing balance comment: dependent on RW                             Pertinent Vitals/Pain  Pain Assessment Pain Assessment: Faces Faces Pain Scale: Hurts a little bit Pain Location: sternal incision Pain Descriptors / Indicators: Sore Pain Intervention(s): Monitored during session    Home Living Family/patient expects to be discharged to:: Private residence Living Arrangements: Alone Available Help at Discharge: Family;Available PRN/intermittently (son and dtr work during the day) Type of Home: Apartment Home Access: Level entry       Home Layout: One level   Additional Comments: pt lives alone in retirement community    Prior Function Prior Level of Function : Independent/Modified Independent             Mobility Comments: no AD, drove ADLs Comments: indep, would pick up take out for meals     Extremity/Trunk Assessment   Upper Extremity Assessment Upper Extremity Assessment: Generalized weakness    Lower Extremity Assessment Lower Extremity Assessment: Generalized weakness    Cervical / Trunk Assessment Cervical / Trunk Assessment: Other exceptions Cervical / Trunk Exceptions: sternal incision  Communication   Communication Communication: Hearing impairment  Cognition Arousal: Alert Behavior During Therapy: Impulsive Overall Cognitive Status: Impaired/Different from baseline Area of Impairment: Memory, Safety/judgement, Problem solving                     Memory: Decreased short-term memory   Safety/Judgement: Decreased awareness of safety, Decreased awareness of deficits   Problem Solving: Decreased initiation, Difficulty sequencing, Requires verbal cues, Requires tactile cues General Comments: per chart pt has been sun downing and has had hallucinations at night requiring haldol to sleep        General Comments General comments (skin integrity, edema, etc.): Pt with poor waveform on pulse ox however SpO2 in the 70s on 2Lo2 via [place] with 4/4 DOE, RN aware    Exercises     Assessment/Plan    PT Assessment Patient needs continued  PT services  PT Problem List Decreased strength;Decreased activity tolerance;Decreased balance;Decreased mobility;Decreased cognition;Decreased knowledge of use of DME;Decreased safety awareness;Cardiopulmonary status limiting activity       PT Treatment Interventions DME instruction;Gait training;Therapeutic activities;Functional mobility training;Therapeutic exercise;Balance training;Neuromuscular re-education    PT Goals (Current goals can be found in the Care Plan section)  Acute Rehab PT Goals Patient Stated Goal: get stronger PT Goal Formulation: With patient/family Time For Goal Achievement: 02/16/23 Potential to Achieve Goals: Good    Frequency Min 1X/week     Co-evaluation               AM-PAC PT "6 Clicks" Mobility  Outcome Measure Help needed turning from your back to your side while in a flat bed without using bedrails?: A Lot Help needed moving from lying on your back to sitting on the side of a flat bed without using bedrails?: A Lot Help needed moving to and from a bed to a chair (including a wheelchair)?: A Lot Help needed standing up from a chair using your arms (e.g., wheelchair or bedside chair)?: A Lot Help needed to walk in hospital room?: A Lot Help needed climbing 3-5 steps with a railing? : A Lot 6 Click Score: 12    End of Session Equipment Utilized During Treatment: Oxygen Activity Tolerance: Other (comment) (limited  by DOE) Patient left: in chair;with chair alarm set;with call bell/phone within reach Nurse Communication: Mobility status PT Visit Diagnosis: Unsteadiness on feet (R26.81);Muscle weakness (generalized) (M62.81);Difficulty in walking, not elsewhere classified (R26.2)    Time: 4098-1191 PT Time Calculation (min) (ACUTE ONLY): 33 min   Charges:   PT Evaluation $PT Eval Moderate Complexity: 1 Mod PT Treatments $Gait Training: 8-22 mins PT General Charges $$ ACUTE PT VISIT: 1 Visit         Lewis Shock, PT, DPT Acute  Rehabilitation Services Secure chat preferred Office #: [phone]   Iona Hansen 02/02/2023, 12:42 PM

## 2023-02-02 NOTE — Progress Notes (Signed)
   Patient Name: Jon [name] Date of Encounter: 02/02/2023 [facility] Cardiologist: [name], MD   Interval Summary  .    No acute events overnight.  Remains in NSR  Remains somewhat confused this AM but without complaints  To be transferred to 4E today  Vital Signs .    Vitals:   02/02/23 0400 02/02/23 0500 02/02/23 0600 02/02/23 0818  BP: 101/63     Pulse:  85 79   Resp: 15 18 17    Temp:    98.1 F (36.7 C)  TempSrc:    Oral  SpO2: 94% 95% 97%   Weight:      Height:        Intake/Output Summary (Last 24 hours) at 02/02/2023 0839 Last data filed at 02/02/2023 0400 Gross per 24 hour  Intake 65.34 ml  Output 3055 ml  Net -2989.66 ml      01/31/2023    5:00 AM 01/30/2023    4:52 AM 01/29/2023    4:12 AM  Last 3 Weights  Weight (lbs) 213 lb 13.5 oz 199 lb 11.8 oz 199 lb 11.8 oz  Weight (kg) 97 kg 90.6 kg 90.6 kg      Telemetry/ECG    SR with PVCs- Personally Reviewed  Physical Exam .   GEN: No acute distress.   Neck: No JVD Cardiac: RRR, no murmurs, rubs, or gallops.  Respiratory: Clear to auscultation bilaterally. GI: Soft, nontender, non-distended  MS: No edema  Assessment & Plan .     77 year old with acute inferior MI in August 2024 with RCA stent placement transferred here for CABG  Coronary artery disease/CABG - Cont plavix, ASA, atorvastatin  Postoperative atrial fibrillation - Cont amiodarone, remains in NSR.  Short duration < 48hr.  Hyperlipidemia - On atorvastatin 80 mg a day with LDL goal less than 55  Chronic kidney disease stage IIIa -Brisk diuresis overnight and Cr remains stable.  Looks relatively euvolemic.      For questions or updates, please contact [facility] Please consult [email or web address] for contact info under        Signed, Orbie Pyo, MD

## 2023-02-02 NOTE — TOC Initial Note (Signed)
Transition of Care [facility]) - Initial/Assessment Note    Patient Details  Name: Jon [name] MRN: [id number] Date of Birth: November 03, 1945  Transition of Care [facility]) CM/SW Contact:    Delilah Shan, LCSWA Phone Number: 02/02/2023, 3:23 PM  Clinical Narrative:                  CSW received consult for possible SNF placement at time of discharge. CSW spoke with patient at bedside regarding PT recommendation of SNF placement at time of discharge. Patient reports PTA he comes from home alone. Patient expressed understanding of PT recommendation and is agreeable to SNF placement at time of discharge. Patient gave permission to fax out for SNF placement. CSW discussed insurance authorization process with patient.  No further questions reported at this time. CSW to continue to follow and assist with discharge planning needs.   Expected Discharge Plan: Skilled Nursing Facility Barriers to Discharge: Continued Medical Work up   Patient Goals and CMS Choice Patient states their goals for this hospitalization and ongoing recovery are:: SNF CMS Medicare.gov Compare Post Acute Care list provided to:: Patient Choice offered to / list presented to : Patient      Expected Discharge Plan and Services In-house Referral: Clinical Social Work Discharge Planning Services: CM Consult Post Acute Care Choice: NA Living arrangements for the past 2 months: Apartment                 DME Arranged: N/A DME Agency: NA       HH Arranged: NA          Prior Living Arrangements/Services Living arrangements for the past 2 months: Apartment Lives with:: Self Patient language and need for interpreter reviewed:: Yes Do you feel safe going back to the place where you live?: No   SNF  Need for Family Participation in Patient Care: Yes (Comment) Care giver support system in place?: Yes (comment)   Criminal Activity/Legal Involvement Pertinent to Current Situation/Hospitalization: No - Comment as  needed  Activities of Daily Living Home Assistive Devices/Equipment: None ADL Screening (condition at time of admission) Patient's cognitive ability adequate to safely complete daily activities?: Yes Is the patient deaf or have difficulty hearing?: No Does the patient have difficulty seeing, even when wearing glasses/contacts?: No Does the patient have difficulty concentrating, remembering, or making decisions?: No Patient able to express need for assistance with ADLs?: Yes Does the patient have difficulty dressing or bathing?: No Independently performs ADLs?: Yes (appropriate for developmental age) Does the patient have difficulty walking or climbing stairs?: Yes Weakness of Legs: Both Weakness of Arms/Hands: None  Permission Sought/Granted Permission sought to share information with : Case Manager, Family Supports, Magazine features editor Permission granted to share information with : Yes, Verbal Permission Granted  Share Information with NAME: [name]  Permission granted to share info w AGENCY: SNF  Permission granted to share info w Relationship: son  Permission granted to share info w Contact Information: [name] [phone]  Emotional Assessment Appearance:: Appears stated age Attitude/Demeanor/Rapport: Gracious Affect (typically observed): Calm Orientation: : Oriented to Self, Oriented to Place, Oriented to  Time, Oriented to Situation Alcohol / Substance Use: Not Applicable Psych Involvement: No (comment)  Admission diagnosis:  Unstable angina (HCC) [I20.0] Patient Active Problem List   Diagnosis Date Noted   Unstable angina (HCC) 01/25/2023   Weakness generalized 12/12/2022   STEMI involving oth coronary artery of inferior wall (HCC) 12/11/2022   STEMI involving right coronary artery (HCC) 12/11/2022   Nightmares 08/04/2022  Senile purpura (HCC) 01/17/2022   Abnormal CT scan of heart    Carotid stenosis 12/01/2021   Precordial pain 11/16/2021   TIA  (transient ischemic attack) 11/16/2021   Postural dizziness with near syncope 03/08/2020   Subclavian arterial stenosis (HCC) 12/07/2019   Abnormal TSH 12/07/2019   Insomnia 10/01/2018   Pulmonary nodule 07/15/2018   Cerebrovascular disease 07/15/2018   Continuous dependence on cigarette smoking 12/02/2017   History of stroke 12/02/2017   Shortness of breath on exertion 12/02/2017   Coronary artery disease involving native coronary artery with angina pectoris (HCC) 11/18/2017   Centrilobular emphysema (HCC) 11/03/2017   H/O parotidectomy 06/02/2017   Parotid mass 05/07/2017   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) 07/30/2016   CKD stage 3 due to type 2 diabetes mellitus (HCC) 03/22/2015   Screening for prostate cancer 07/22/2011   Diabetic peripheral neuropathy (HCC) 06/05/2011   ROTATOR CUFF SYNDROME, RIGHT 03/22/2010   History of cardiovascular disorder 02/28/2010   Diabetes mellitus, type II, insulin dependent (HCC) 01/31/2010   Hyperlipidemia due to type 2 diabetes mellitus (HCC) 01/31/2010   TOBACCO ABUSE 01/31/2010   Essential hypertension 01/31/2010   PCP:  Agapito Games, MD Pharmacy:   [facility]) - [place], [place] - [address] [facility] [address] [place] [address] Phone: [phone] Fax: [phone]  [facility] [address]), [place] - [address] [place] [address] Phone: [phone] Fax: [phone]  [facility] - [place], [place] - [address] [place] [address] Phone: [phone] Fax: [phone]  [facility] [address] [place] [address] Phone: [phone] Fax: [phone]  [facility] [address] [place] [address] Phone:  [phone] Fax: [phone]     Social Determinants of Health (SDOH) Social History: SDOH Screenings   Food Insecurity: No Food Insecurity (01/25/2023)  Housing: Low Risk  (01/25/2023)  Transportation Needs: No Transportation Needs (01/25/2023)  Utilities: Not At Risk (01/25/2023)  Alcohol Screen: Low Risk  (08/01/2022)  Depression (PHQ2-9): Low Risk  (12/04/2022)  Financial Resource Strain: Low Risk  (08/01/2022)  Physical Activity: Inactive (08/01/2022)  Social Connections: Unknown (01/23/2023)   Received from [facility]  Stress: No Stress Concern Present (01/23/2023)   Received from [facility]  Tobacco Use: Medium Risk (01/30/2023)   SDOH Interventions:     Readmission Risk Interventions     No data to display

## 2023-02-03 DIAGNOSIS — I200 Unstable angina: Secondary | ICD-10-CM | POA: Diagnosis not present

## 2023-02-03 LAB — GLUCOSE, CAPILLARY
Glucose-Capillary: 123 mg/dL — ABNORMAL HIGH (ref 70–99)
Glucose-Capillary: 117 mg/dL — ABNORMAL HIGH (ref 70–99)
Glucose-Capillary: 130 mg/dL — ABNORMAL HIGH (ref 70–99)
Glucose-Capillary: 126 mg/dL — ABNORMAL HIGH (ref 70–99)
Glucose-Capillary: 136 mg/dL — ABNORMAL HIGH (ref 70–99)
Glucose-Capillary: 126 mg/dL — ABNORMAL HIGH (ref 70–99)
Glucose-Capillary: 123 mg/dL — ABNORMAL HIGH (ref 70–99)

## 2023-02-03 MED ORDER — GUAIFENESIN ER 600 MG PO TB12
600.0000 mg | ORAL_TABLET | Freq: Two times a day (BID) | ORAL | Status: DC
  Administered 2023-02-03 – 2023-02-09 (×12): 600 mg via ORAL
  Filled 2023-02-03 (×13): qty 1

## 2023-02-03 MED ORDER — LACTULOSE 10 GM/15ML PO SOLN
20.0000 g | Freq: Once | ORAL | Status: AC
  Administered 2023-02-03: 20 g via ORAL
  Filled 2023-02-03: qty 30

## 2023-02-03 MED FILL — Thrombin (Recombinant) For Soln 20000 Unit: CUTANEOUS | Qty: 1 | Status: AC

## 2023-02-03 NOTE — Evaluation (Signed)
Occupational Therapy Evaluation Patient Details Name: Jon [name] MRN: [id number] DOB: March 14, 1946 Today's Date: 02/03/2023   History of Present Illness Jon [name] is a 77 y.o. male s/p CABGx3 on 9/20.  PMH: multivessel CAD with prior recommendation for CABG evaluation with recent inferior ST elevation MI status post PCI/DES to the RCA in 12/2022, left subclavian artery stenosis with associated subclavian steal status post stenting in 12/2021, remote CVA/TIA, DM2, HTN, HLD, prior tobacco use with likely emphysema   Clinical Impression   Patient admitted for the diagnosis above.  PTA he lived at home, alone, but did have family close by if needed.  Patient continued to perform his own ADL, drove and cared for his own medications.  Patient presents with poor balance and activity tolerance, decreased safety, and fair cognition.  Currently he is needing Mod A for ADL and basic mobility.  OT to continue efforts in the acute setting to address deficits and Patient will benefit from continued inpatient follow up therapy, <3 hours/day        If plan is discharge home, recommend the following: Assist for transportation;Assistance with cooking/housework;A lot of help with bathing/dressing/bathroom;A lot of help with walking and/or transfers;Direct supervision/assist for medications management    Functional Status Assessment  Patient has had a recent decline in their functional status and demonstrates the ability to make significant improvements in function in a reasonable and predictable amount of time.  Equipment Recommendations  None recommended by OT    Recommendations for Other Services       Precautions / Restrictions Precautions Precautions: Fall;Sternal Precaution Booklet Issued: No Precaution Comments: continues to need reinforcement of sternal precautions Restrictions Weight Bearing Restrictions: Yes Other Position/Activity Restrictions: sternal precautions       Mobility Bed Mobility               General bed mobility comments: up in recliner via cardiac team    Transfers Overall transfer level: Needs assistance Equipment used: Rolling walker (2 wheels) Transfers: Sit to/from Stand, Bed to chair/wheelchair/BSC Sit to Stand: Mod assist Stand pivot transfers: Mod assist                Balance Overall balance assessment: Needs assistance Sitting-balance support: Feet supported, No upper extremity supported Sitting balance-Leahy Scale: Fair     Standing balance support: Reliant on assistive device for balance Standing balance-Leahy Scale: Poor                             ADL either performed or assessed with clinical judgement   ADL Overall ADL's : Needs assistance/impaired Eating/Feeding: Independent;Sitting   Grooming: Wash/dry hands;Wash/dry face;Oral care;Set up;Sitting   Upper Body Bathing: Cueing for UE precautions;Sitting;Supervision/ safety   Lower Body Bathing: Moderate assistance;Sit to/from stand   Upper Body Dressing : Minimal assistance;Cueing for UE precautions;Sitting   Lower Body Dressing: Moderate assistance;Sit to/from stand   Toilet Transfer: Moderate assistance;Stand-pivot                   Vision Patient Visual Report: No change from baseline       Perception Perception: Not tested       Praxis Praxis: Not tested       Pertinent Vitals/Pain Pain Assessment Faces Pain Scale: Hurts a little bit Pain Location: sternal incision Pain Descriptors / Indicators: Sore, Aching Pain Intervention(s): Monitored during session     Extremity/Trunk Assessment Upper Extremity Assessment Upper Extremity Assessment: Overall  WFL for tasks assessed   Lower Extremity Assessment Lower Extremity Assessment: Defer to PT evaluation   Cervical / Trunk Assessment Cervical / Trunk Assessment: Other exceptions Cervical / Trunk Exceptions: sternal incision   Communication  Communication Communication: Hearing impairment   Cognition Arousal: Alert Behavior During Therapy: Impulsive Overall Cognitive Status: No family/caregiver present to determine baseline cognitive functioning                       Memory: Decreased recall of precautions, Decreased short-term memory   Safety/Judgement: Decreased awareness of safety, Decreased awareness of deficits   Problem Solving: Slow processing, Requires verbal cues, Requires tactile cues       General Comments       Exercises     Shoulder Instructions      Home Living Family/patient expects to be discharged to:: Private residence Living Arrangements: Alone Available Help at Discharge: Family;Available PRN/intermittently Type of Home: Apartment Home Access: Level entry     Home Layout: One level     Bathroom Shower/Tub: Producer, television/film/video: Standard Bathroom Accessibility: Yes How Accessible: Accessible via walker Home Equipment: None   Additional Comments: pt lives alone in retirement community      Prior Functioning/Environment Prior Level of Function : Independent/Modified Independent             Mobility Comments: no AD, drove ADLs Comments: indep, would pick up take out for meals        OT Problem List: Decreased strength;Decreased activity tolerance;Impaired balance (sitting and/or standing);Pain;Decreased safety awareness;Decreased cognition      OT Treatment/Interventions: Self-care/ADL training;Therapeutic activities;Patient/family education;Energy conservation;Balance training;DME and/or AE instruction    OT Goals(Current goals can be found in the care plan section) Acute Rehab OT Goals Patient Stated Goal: Return home OT Goal Formulation: With patient Time For Goal Achievement: 02/17/23 Potential to Achieve Goals: Fair ADL Goals Pt Will Perform Grooming: with contact guard assist;standing Pt Will Perform Upper Body Bathing: with  supervision;sitting Pt Will Perform Lower Body Bathing: with min assist;sit to/from stand Pt Will Perform Upper Body Dressing: with supervision;sitting Pt Will Perform Lower Body Dressing: with min assist;sit to/from stand Pt Will Transfer to Toilet: with contact guard assist;regular height toilet;ambulating  OT Frequency: Min 1X/week    Co-evaluation              AM-PAC OT "6 Clicks" Daily Activity     Outcome Measure Help from another person eating meals?: None Help from another person taking care of personal grooming?: A Little Help from another person toileting, which includes using toliet, bedpan, or urinal?: A Lot Help from another person bathing (including washing, rinsing, drying)?: A Lot Help from another person to put on and taking off regular upper body clothing?: A Little Help from another person to put on and taking off regular lower body clothing?: A Lot 6 Click Score: 16   End of Session Equipment Utilized During Treatment: Rolling walker (2 wheels) Nurse Communication: Mobility status  Activity Tolerance: Patient limited by fatigue Patient left: in chair;with call bell/phone within reach;with chair alarm set  OT Visit Diagnosis: Unsteadiness on feet (R26.81);Muscle weakness (generalized) (M62.81);Other symptoms and signs involving cognitive function                Time: 3295-1884 OT Time Calculation (min): 16 min Charges:  OT General Charges $OT Visit: 1 Visit OT Evaluation $OT Eval Moderate Complexity: 1 Mod  02/03/2023  RP, OTR/L  Acute Rehabilitation Services  Office:  [phone]   Suzanna Obey 02/03/2023, 11:34 AM

## 2023-02-03 NOTE — Progress Notes (Signed)
Mobility Specialist Progress Note:  02/03/23 1632  Mobility  Activity Ambulated with assistance in room  Level of Assistance Moderate assist, patient does 50-74%  Assistive Device Front wheel walker  Distance Ambulated (ft) 20 ft ((10+10))  RUE Weight Bearing NWB  LUE Weight Bearing NWB  Activity Response Tolerated well  Mobility Referral Yes  $Mobility charge 1 Mobility  Mobility Specialist Start Time (ACUTE ONLY) 1555  Mobility Specialist Stop Time (ACUTE ONLY) 1630  Mobility Specialist Time Calculation (min) (ACUTE ONLY) 35 min   Pre Mobility: 79 HR  During Mobility: 89 HR  Post Mobility: 85 HR   Pt received in bed, agreeable to mobility. Pt able to stand from bedside with CG for balance. C/o dizziness and slight leg weakness but able to ambulate around bed with no fault. Seated rest required for safety. Pt eager to ambulate greater distance needing ModA for second STS. Pt able to ambulate to sink from bedside. Experienced x1 LOB during session but quickly recovered with MinA. Pt returned to bed with call bell in reach and all needs met.    Leory Plowman  Mobility Specialist Please contact via [employer] office at [phone]

## 2023-02-03 NOTE — TOC Progression Note (Signed)
Transition of Care [facility]) - Progression Note    Patient Details  Name: Jon [name] MRN: [id number] Date of Birth: 07/21/1945  Transition of Care [facility]) CM/SW Contact  Eduard Roux, [place] Phone Number: 02/03/2023, 3:56 PM  Clinical Narrative:     CSW met with patient at bedside. CSW introduced self and explained role. CSW gave patient bed offers. Patient states he will review, discuss with family and give his decision tomorrow.  TOC will continue to follow and assist with discharge planning.  Antony Blackbird, MSW, LCSW Clinical Social Worker    Expected Discharge Plan: Skilled Nursing Facility Barriers to Discharge: Continued Medical Work up  Expected Discharge Plan and Services In-house Referral: Clinical Social Work Discharge Planning Services: CM Consult Post Acute Care Choice: NA Living arrangements for the past 2 months: Apartment                 DME Arranged: N/A DME Agency: NA       HH Arranged: NA           Social Determinants of Health (SDOH) Interventions SDOH Screenings   Food Insecurity: No Food Insecurity (01/25/2023)  Housing: Low Risk  (01/25/2023)  Transportation Needs: No Transportation Needs (01/25/2023)  Utilities: Not At Risk (01/25/2023)  Alcohol Screen: Low Risk  (08/01/2022)  Depression (PHQ2-9): Low Risk  (12/04/2022)  Financial Resource Strain: Low Risk  (08/01/2022)  Physical Activity: Inactive (08/01/2022)  Social Connections: Unknown (01/23/2023)   Received from [facility]  Stress: No Stress Concern Present (01/23/2023)   Received from [facility]  Tobacco Use: Medium Risk (01/30/2023)    Readmission Risk Interventions     No data to display

## 2023-02-03 NOTE — Progress Notes (Signed)
Pt assisted from bed to chair, pt needed moderate assistance standing and transitioning to chair. Pt wobbly upon standing but able to moe laterally with RW support and contact assistance.   Pt educated on the difference between CR and SNF. Will refer to [facility].   Faustino Congress 02/03/2023 11:19 AM  [id number]

## 2023-02-03 NOTE — Progress Notes (Signed)
   Patient Name: Jon [name] Date of Encounter: 02/03/2023 [facility] Cardiologist: [name], MD   Interval Summary  .    Patient doing okay this morning, but complaining of weakness when he is up as well as shortness of breath with activity.  Somewhat tangential.  No chest discomfort at rest.  Vital Signs .    Vitals:   02/03/23 0325 02/03/23 0458 02/03/23 0648 02/03/23 0737  BP:  123/61  104/68  Pulse:  78  77  Resp: (!) 21 18    Temp:  97.7 F (36.5 C)  98.2 F (36.8 C)  TempSrc:  Oral  Oral  SpO2:  100%  99%  Weight:   91.9 kg   Height:        Intake/Output Summary (Last 24 hours) at 02/03/2023 1107 Last data filed at 02/03/2023 0459 Gross per 24 hour  Intake 720 ml  Output 1000 ml  Net -280 ml      02/03/2023    6:48 AM 02/03/2023    4:58 AM 02/02/2023    2:45 PM  Last 3 Weights  Weight (lbs) 202 lb 9.6 oz -- 203 lb 14.8 oz  Weight (kg) 91.9 kg -- 92.5 kg      Telemetry/ECG    Sinus rhythm with no significant arrhythmia- Personally Reviewed  Physical Exam .   GEN: No acute distress.   Neck: No JVD Cardiac: RRR, no murmurs, rubs, or gallops.  Respiratory: Clear to auscultation bilaterally. GI: Soft, nontender, non-distended  MS: No edema  Assessment & Plan .     1.  Non-STEMI: Recovering well from multivessel CABG.  Treated with clopidogrel. 2.  Postoperative atrial fibrillation: Maintaining sinus rhythm on oral amiodarone.  Hold beta-blocker per surgical team due to labile blood pressures.  Overall the patient appears clinically stable on DAPT with aspirin and clopidogrel, a high intensity statin drug with atorvastatin 80 mg, and oral amiodarone to treat postoperative atrial fibrillation.  He is maintaining sinus rhythm.  He will continue to work with physical therapy and cardiac rehab.  Plans noted for need of short-term skilled nursing at discharge.  For questions or updates, please contact  [facility] Please consult  [email or web address] for contact info under        Signed, Tonny Bollman, MD

## 2023-02-03 NOTE — Plan of Care (Signed)
On first evening assessment at 2020, pt alert, oriented x 4 but sometimes forgetful, repeating the same questions. Has many questions about his medications and side effects. Mostly normal conversation but sometimes questions don't make sense and seems a little confused about situation and items in room. Needs frequent reminders about sternal precautions.   Refused PRN trazadone tonight; says he only takes it "once every 8-10 days" at home and says sometimes it makes his night terrors worse.   Around 2245, pt ready to sleep for the night. Assisted to transfer from chair to bed. Room made dark and quiet to promote sleep; delirium precautions in place.  When RN checking on pt overnight, appears to be asleep.   At 0500, pt oriented x4, less confusion noted. Pleasant, cooperative and making jokes. Reports sleeping well except for nonproductive cough waking him up sometimes. Denies nightmares. Encouraged incentive spirometer and gave new flutter valve and instructed pt on use to help cough be productive.    Problem: Education: Goal: Knowledge of General Education information will improve Description: Including pain rating scale, medication(s)/side effects and non-pharmacologic comfort measures Outcome: Progressing   Problem: Clinical Measurements: Goal: Ability to maintain clinical measurements within normal limits will improve Outcome: Progressing Goal: Will remain free from infection Outcome: Progressing Goal: Diagnostic test results will improve Outcome: Progressing Goal: Respiratory complications will improve Outcome: Progressing Goal: Cardiovascular complication will be avoided Outcome: Progressing   Problem: Activity: Goal: Risk for activity intolerance will decrease Outcome: Progressing   Problem: Coping: Goal: Level of anxiety will decrease Outcome: Progressing   Problem: Elimination: Goal: Will not experience complications related to urinary retention Outcome: Progressing    Problem: Education: Goal: Knowledge of General Education information will improve Description: Including pain rating scale, medication(s)/side effects and non-pharmacologic comfort measures 02/03/2023 0509 by Dorathy Daft, RN Outcome: Progressing 02/03/2023 0508 by Dorathy Daft, RN Outcome: Progressing   Problem: Health Behavior/Discharge Planning: Goal: Ability to manage health-related needs will improve 02/03/2023 0509 by Dorathy Daft, RN Outcome: Progressing 02/03/2023 0508 by Dorathy Daft, RN Outcome: Progressing   Problem: Clinical Measurements: Goal: Ability to maintain clinical measurements within normal limits will improve 02/03/2023 0509 by Dorathy Daft, RN Outcome: Progressing 02/03/2023 0508 by Dorathy Daft, RN Outcome: Progressing Goal: Will remain free from infection 02/03/2023 0509 by Dorathy Daft, RN Outcome: Progressing 02/03/2023 0508 by Dorathy Daft, RN Outcome: Progressing Goal: Diagnostic test results will improve 02/03/2023 0509 by Dorathy Daft, RN Outcome: Progressing 02/03/2023 0508 by Dorathy Daft, RN Outcome: Progressing Goal: Respiratory complications will improve 02/03/2023 0509 by Dorathy Daft, RN Outcome: Progressing 02/03/2023 0508 by Dorathy Daft, RN Outcome: Progressing Goal: Cardiovascular complication will be avoided 02/03/2023 0509 by Dorathy Daft, RN Outcome: Progressing 02/03/2023 0508 by Dorathy Daft, RN Outcome: Progressing   Problem: Activity: Goal: Risk for activity intolerance will decrease 02/03/2023 0509 by Dorathy Daft, RN Outcome: Progressing 02/03/2023 0508 by Dorathy Daft, RN Outcome: Progressing   Problem: Elimination: Goal: Will not experience complications related to bowel motility 02/03/2023 0509 by Dorathy Daft, RN Outcome: Not Progressing 02/03/2023 0508 by Dorathy Daft, RN Outcome: Progressing Goal: Will not experience complications related to urinary  retention 02/03/2023 0509 by Dorathy Daft, RN Outcome: Progressing 02/03/2023 0508 by Dorathy Daft, RN Outcome: Progressing

## 2023-02-03 NOTE — Progress Notes (Addendum)
      [address]       [place] [address]             [phone]        4 Days Post-Op Procedure(s) (LRB): CORONARY ARTERY BYPASS GRAFTING TIMES THREE USING LEFT INTERNAL MAMMARY ARTERY AND ENDOHARVEST OF RIGHT LEG GREATER SAPHENOUS VEIN (N/A) TRANSESOPHAGEAL ECHOCARDIOGRAM (N/A)  Subjective: He is alert, oriented this am.  Objective: Vital signs in last 24 hours: Temp:  [97.7 F (36.5 C)-98.6 F (37 C)] 97.7 F (36.5 C) (09/24 0458) Pulse Rate:  [71-93] 78 (09/24 0458) Cardiac Rhythm: Normal sinus rhythm (09/23 2020) Resp:  [16-23] 18 (09/24 0458) BP: (104-146)/(49-82) 123/61 (09/24 0458) SpO2:  [90 %-100 %] 100 % (09/24 0458) Weight:  [91.9 kg-92.5 kg] 91.9 kg (09/24 0648)  Pre op weight 90.6 kg Current Weight  02/03/23 91.9 kg      Intake/Output from previous day: 09/23 0701 - 09/24 0700 In: 960 [P.O.:960] Out: 1000 [Urine:1000]   Physical Exam:  Cardiovascular: RRR Pulmonary: Slightly diminished bibasilar breath sounds Abdomen: Soft, non tender, bowel sounds present. Extremities: Trace bilateral lower extremity edema. Wounds: Clean and dry.  No erythema or signs of infection.  Lab Results: CBC: Recent Labs    02/01/23 0236 02/02/23 0651  WBC 5.7 6.8  HGB 7.7* 8.8*  HCT 23.9* 27.2*  PLT 97* 107*   BMET:  Recent Labs    02/01/23 0236 02/02/23 0651  NA 135 136  K 3.8 4.2  CL 102 105  CO2 24 21*  GLUCOSE 150* 155*  BUN 15 22  CREATININE 1.38* 1.49*  CALCIUM 7.8* 8.3*    PT/INR:  Lab Results  Component Value Date   INR 1.4 (H) 01/30/2023   INR 1.0 01/25/2023   INR 1.4 (H) 12/11/2022   ABG:  INR: Will add last result for INR, ABG once components are confirmed Will add last 4 CBG results once components are confirmed  Assessment/Plan:  1. CV - S/p NSTEMI. Prevous a fib. Maintaining SR, first degree heart block. On Amiodarone 400 mg bid and Plavix. SBP labile so will not start low dose Lopressor yet. 2.   Pulmonary - On 1 L of oxygen via [place]. Wean as able. Mucinex for cough. Encourage incentive spirometer 3. Volume Overload - Will give oral Lasix 4.  Expected post op acute blood loss anemia - Last H and H stable at 8.8 and 27.2 5. DM-CBGs 177/117/130. On Insulin PRN. Will restart Synjardy in am. Pre op HGA1C 6.3 6. Thrombocytopenia-Last platelets 107,000 7. History of CKD (stage III)-Last creatinine 1.49. Baseline creatinine appears around 1.3-1.4 8. Sundowning-He was not agitated but was confused again last night.  9. LOC constipation-will stop stool softeners at patient request 10. Disposition-per patient, discussed with family and patient neesd SNF at discharge. Deconditioned-continue with PT/OT  Tiburcio Linder M ZimmermanPA-C 7:02 AM   Chart reviewed, patient examined, agree with above. He is much more alert with with it this evening. Now he needs to walk, use IS and get stronger. He wants to go to SNF at discharge.

## 2023-02-03 NOTE — NC FL2 (Signed)
[place] MEDICAID FL2 LEVEL OF CARE FORM     IDENTIFICATION  Patient Name: Jon [name] Birthdate: 1946-01-19 Sex: male Admission Date (Current Location): 01/25/2023  [facility] and [place] Number:  Producer, television/film/video and Address:  [facility] [place], [address] [place], [place] [address]      Provider Number: [id number]  Attending Physician Name and Address:  [name], MD  Relative Name and Phone Number:  [name] (son) [phone]    Current Level of Care: Hospital Recommended Level of Care: Skilled Nursing Facility Prior Approval Number:    Date Approved/Denied: 02/03/23 PASRR Number: [id number]  Discharge Plan: SNF    Current Diagnoses: Patient Active Problem List   Diagnosis Date Noted   Unstable angina (HCC) 01/25/2023   Weakness generalized 12/12/2022   STEMI involving oth coronary artery of inferior wall (HCC) 12/11/2022   STEMI involving right coronary artery (HCC) 12/11/2022   Nightmares 08/04/2022   Senile purpura (HCC) 01/17/2022   Abnormal CT scan of heart    Carotid stenosis 12/01/2021   Precordial pain 11/16/2021   TIA (transient ischemic attack) 11/16/2021   Postural dizziness with near syncope 03/08/2020   Subclavian arterial stenosis (HCC) 12/07/2019   Abnormal TSH 12/07/2019   Insomnia 10/01/2018   Pulmonary nodule 07/15/2018   Cerebrovascular disease 07/15/2018   Continuous dependence on cigarette smoking 12/02/2017   History of stroke 12/02/2017   Shortness of breath on exertion 12/02/2017   Coronary artery disease involving native coronary artery with angina pectoris (HCC) 11/18/2017   Centrilobular emphysema (HCC) 11/03/2017   H/O parotidectomy 06/02/2017   Parotid mass 05/07/2017   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) 07/30/2016   CKD stage 3 due to type 2 diabetes mellitus (HCC) 03/22/2015   Screening for prostate cancer 07/22/2011   Diabetic  peripheral neuropathy (HCC) 06/05/2011   ROTATOR CUFF SYNDROME, RIGHT 03/22/2010   History of cardiovascular disorder 02/28/2010   Diabetes mellitus, type II, insulin dependent (HCC) 01/31/2010   Hyperlipidemia due to type 2 diabetes mellitus (HCC) 01/31/2010   TOBACCO ABUSE 01/31/2010   Essential hypertension 01/31/2010    Orientation RESPIRATION BLADDER Height & Weight     Time, Situation, Place, Self  O2 (1L currently) Continent Weight: 202 lb 9.6 oz (91.9 kg) Height:  6\' 1"  (185.4 cm)  BEHAVIORAL SYMPTOMS/MOOD NEUROLOGICAL BOWEL NUTRITION STATUS      Continent Diet (See discharge summary)  AMBULATORY STATUS COMMUNICATION OF NEEDS Skin   Extensive Assist Verbally Other (Comment) (Ecchymosis,arm,bil.,Wound/Incision LDAs,Incision closed,chest,other,Incision closed,Leg,R)                       Personal Care Assistance Level of Assistance  Bathing, Feeding, Dressing Bathing Assistance: Maximum assistance Feeding assistance: Limited assistance Dressing Assistance: Maximum assistance     Functional Limitations Info  Sight, Hearing, Speech Sight Info: Adequate Hearing Info: Adequate Speech Info: Adequate    SPECIAL CARE FACTORS FREQUENCY  PT (By licensed PT), OT (By licensed OT)     PT Frequency: 5x weekly OT Frequency: 5x weekly            Contractures Contractures Info: Not present    Additional Factors Info  Insulin Sliding Scale, Allergies, Code Status Code Status Info: Full Code Allergies Info: Gabapentin  Lisinopril  Metformin And Related   Insulin Sliding Scale Info: Insulin aspart (novoLOG) injection 0-24 Units 3 times daily with meals       Current Medications (02/03/2023):  This is the current hospital active medication list Current Facility-Administered Medications  Medication Dose Route Frequency Provider Last Rate Last Admin   0.9 %  sodium chloride infusion  250 mL Intravenous PRN [name], MD       acetaminophen (TYLENOL) tablet 1,000  mg  1,000 mg Oral Q6H [name], MD   1,000 mg at 02/03/23 0441   amiodarone (PACERONE) tablet 400 mg  400 mg Oral BID [name], MD   400 mg at 02/02/23 2151   aspirin EC tablet 81 mg  81 mg Oral Daily [name], MD       atorvastatin (LIPITOR) tablet 80 mg  80 mg Oral QHS [name], MD   80 mg at 02/02/23 2151   clopidogrel (PLAVIX) tablet 75 mg  75 mg Oral Daily [name], MD   75 mg at 02/02/23 1122   Fe Fum-Vit C-Vit B12-FA (TRIGELS-F FORTE) capsule 1 capsule  1 capsule Oral QPC breakfast [name], MD   1 capsule at 02/02/23 1121   guaiFENesin (MUCINEX) 12 hr tablet 600 mg  600 mg Oral BID Doree Fudge M, PA-C       insulin aspart (novoLOG) injection 0-24 Units  0-24 Units Subcutaneous TID AC & HS [name], MD   2 Units at 02/03/23 0644   lactulose (CHRONULAC) 10 GM/15ML solution 20 g  20 g Oral Daily PRN [name], MD       lactulose (CHRONULAC) 10 GM/15ML solution 20 g  20 g Oral Once Zimmerman, Donielle M, PA-C       mupirocin ointment (BACTROBAN) 2 % 1 Application  1 Application Nasal BID [name], MD   1 Application at 02/02/23 2152   ondansetron (ZOFRAN) tablet 4 mg  4 mg Oral Q6H PRN [name], MD       Or   ondansetron (ZOFRAN) injection 4 mg  4 mg Intravenous Q6H PRN [name], MD       Oral care mouth rinse  15 mL Mouth Rinse PRN [name], MD       pantoprazole (PROTONIX) EC tablet 40 mg  40 mg Oral Daily [name], MD   40 mg at 02/02/23 1121   sodium chloride flush (NS) 0.9 % injection 3 mL  3 mL Intravenous Q12H [name], MD   3 mL at 02/02/23 2152   sodium chloride flush (NS) 0.9 % injection 3 mL  3 mL Intravenous PRN [name], MD       traZODone (DESYREL) tablet 50 mg  50 mg Oral QHS PRN [name], MD   50 mg at 01/31/23 2113     Discharge Medications: Please see discharge summary for a list of discharge medications.  Relevant Imaging Results:  Relevant Lab  Results:   Additional Information SSN: [id number]  Inis Sizer, LCSW

## 2023-02-04 ENCOUNTER — Encounter: Payer: Medicare Other | Admitting: Physical Therapy

## 2023-02-04 DIAGNOSIS — I200 Unstable angina: Secondary | ICD-10-CM | POA: Diagnosis not present

## 2023-02-04 LAB — GLUCOSE, CAPILLARY
Glucose-Capillary: 117 mg/dL — ABNORMAL HIGH (ref 70–99)
Glucose-Capillary: 164 mg/dL — ABNORMAL HIGH (ref 70–99)
Glucose-Capillary: 152 mg/dL — ABNORMAL HIGH (ref 70–99)
Glucose-Capillary: 164 mg/dL — ABNORMAL HIGH (ref 70–99)

## 2023-02-04 MED ORDER — METOPROLOL TARTRATE 12.5 MG HALF TABLET
12.5000 mg | ORAL_TABLET | Freq: Two times a day (BID) | ORAL | Status: DC
  Administered 2023-02-04 – 2023-02-09 (×11): 12.5 mg via ORAL
  Filled 2023-02-04 (×11): qty 1

## 2023-02-04 MED ORDER — BISACODYL 10 MG RE SUPP: 10.0000 mg | Freq: Once | RECTAL | Status: DC

## 2023-02-04 MED FILL — Sodium Bicarbonate IV Soln 8.4%: INTRAVENOUS | Qty: 50 | Status: AC

## 2023-02-04 MED FILL — Electrolyte-R (PH 7.4) Solution: INTRAVENOUS | Qty: 4000 | Status: AC

## 2023-02-04 MED FILL — Sodium Chloride IV Soln 0.9%: INTRAVENOUS | Qty: 2000 | Status: AC

## 2023-02-04 MED FILL — Mannitol IV Soln 20%: INTRAVENOUS | Qty: 500 | Status: AC

## 2023-02-04 MED FILL — Potassium Chloride Inj 2 mEq/ML: INTRAVENOUS | Qty: 40 | Status: AC

## 2023-02-04 MED FILL — Heparin Sodium (Porcine) Inj 1000 Unit/ML: INTRAMUSCULAR | Qty: 10 | Status: AC

## 2023-02-04 MED FILL — Lidocaine HCl Local Soln Prefilled Syringe 100 MG/5ML (2%): INTRAMUSCULAR | Qty: 5 | Status: AC

## 2023-02-04 MED FILL — Magnesium Sulfate Inj 50%: INTRAMUSCULAR | Qty: 10 | Status: AC

## 2023-02-04 MED FILL — Albumin, Human Inj 5%: INTRAVENOUS | Qty: 250 | Status: AC

## 2023-02-04 MED FILL — Heparin Sodium (Porcine) Inj 1000 Unit/ML: Qty: 1000 | Status: AC

## 2023-02-04 NOTE — Plan of Care (Signed)
Problem: Activity: Goal: Risk for activity intolerance will decrease Outcome: Progressing   Problem: Clinical Measurements: Goal: Will remain free from infection Outcome: Progressing

## 2023-02-04 NOTE — Progress Notes (Signed)
   Patient Name: Jon [name] Date of Encounter: 02/04/2023 [facility] Cardiologist: [name], MD   Interval Summary  .    AF RVR overnight now back in NSR  Sleepy this AM  Vital Signs .    Vitals:   02/03/23 2343 02/04/23 0448 02/04/23 0741 02/04/23 1116  BP: (!) 105/52 119/64 126/65 (!) 99/59  Pulse: 78 77 77 65  Resp:   20 19  Temp: 99.1 F (37.3 C) 99 F (37.2 C) 97.6 F (36.4 C) 99 F (37.2 C)  TempSrc: Oral Oral Oral Oral  SpO2: 98% 98% 96% 98%  Weight:  92.8 kg    Height:        Intake/Output Summary (Last 24 hours) at 02/04/2023 1234 Last data filed at 02/04/2023 4098 Gross per 24 hour  Intake 1120 ml  Output 375 ml  Net 745 ml      02/04/2023    4:48 AM 02/03/2023    6:48 AM 02/03/2023    4:58 AM  Last 3 Weights  Weight (lbs) 204 lb 9.4 oz 202 lb 9.6 oz --  Weight (kg) 92.8 kg 91.9 kg --      Telemetry/ECG    SR, AF RVR x 2- Personally Reviewed  Physical Exam .   GEN: No acute distress.   Neck: No JVD Cardiac: RRR, no murmurs, rubs, or gallops.  Respiratory: Clear to auscultation bilaterally. GI: Soft, nontender, non-distended  MS: No edema  Assessment & Plan .     77 year old with acute inferior MI in August 2024 with RCA stent placement transferred here for CABG   Coronary artery disease/CABG - Cont plavix, ASA, atorvastatin  2.  Postoperative atrial fibrillation - Cont amiodarone, remains in NSR with short bursts of AF; no need for A/C currently  3.  Hyperlipidemia - On atorvastatin 80 mg a day with LDL goal less than 55  4.  Chronic kidney disease stage IIIa -Euvolemic on exam today      For questions or updates, please contact [facility] Please consult [email or web address] for contact info under        Signed, Orbie Pyo, MD

## 2023-02-04 NOTE — Care Management Important Message (Signed)
Important Message  Patient Details  Name: Jon [name] MRN: [id number] Date of Birth: [birth year]   Important Message Given:  Yes - Medicare IM     Sherilyn Banker 02/04/2023, 1:45 PM

## 2023-02-04 NOTE — Progress Notes (Signed)
Mobility Specialist Progress Note:   02/04/23 1137  Mobility  Activity Ambulated with assistance in room  Level of Assistance Moderate assist, patient does 50-74%  Assistive Device Front wheel walker  Distance Ambulated (ft) 25 ft  RUE Weight Bearing NWB  LUE Weight Bearing NWB  Activity Response Tolerated well  Mobility Referral Yes  $Mobility charge 1 Mobility  Mobility Specialist Start Time (ACUTE ONLY) 1000  Mobility Specialist Stop Time (ACUTE ONLY) 1025  Mobility Specialist Time Calculation (min) (ACUTE ONLY) 25 min   Pre Mobility: 70 HR  During Mobility: 79 HR  Post Mobility: 75 HR   Pt received in bed, agreeable to mobility. ModA to stand. CG during ambulation. Pt c/o dizziness upon standing. Rated dizziness 8/10. During ambulation pt displayed DOE needing seated rest break. Pursed lip breathing encouraged. After recovery pt able to ambulate back to bed and left in supine position asymptomatic with call bell in reach and all needs met. RN notified.  Leory Plowman  Mobility Specialist Please contact via [employer] office at [phone]

## 2023-02-04 NOTE — Progress Notes (Signed)
PT Cancellation Note  Patient Details Name: Nang Havel MRN: [id number] DOB: [birth year]   Cancelled Treatment:    Reason Eval/Treat Not Completed: Fatigue/lethargy limiting ability to participate; attempted twice to see pt, initially just finished walking with mobility specialist, on second attempt just finished up with nursing and bath and too fatigued for therapy.  Will continue attempts.    Elray Mcgregor 02/04/2023, 4:38 PM Sheran Lawless, PT Acute Rehabilitation Services [phone] 02/04/2023

## 2023-02-04 NOTE — TOC Progression Note (Signed)
Transition of Care [facility] - Progression Note    Patient Details  Name: Jon [name] MRN: [id number] Date of Birth: [birth year]  Transition of Care [facility]) CM/SW Contact  Eduard Roux, [place] Phone Number: 02/04/2023, 4:16 PM  Clinical Narrative:     Family as accepted [facility] in [place] bed offer-   [facility] has confirmed bed offer  Per chart review - d/c pending bowels working and improvement w/mobility.   TOC will continue to follow and assist with discharge planning.  Antony Blackbird, MSW, LCSW Clinical Social Worker    Expected Discharge Plan: Skilled Nursing Facility Barriers to Discharge: Continued Medical Work up  Expected Discharge Plan and Services In-house Referral: Clinical Social Work Discharge Planning Services: CM Consult Post Acute Care Choice: NA Living arrangements for the past 2 months: Apartment                 DME Arranged: N/A DME Agency: NA       HH Arranged: NA           Social Determinants of Health (SDOH) Interventions SDOH Screenings   Food Insecurity: No Food Insecurity (01/25/2023)  Housing: Low Risk  (01/25/2023)  Transportation Needs: No Transportation Needs (01/25/2023)  Utilities: Not At Risk (01/25/2023)  Alcohol Screen: Low Risk  (08/01/2022)  Depression (PHQ2-9): Low Risk  (12/04/2022)  Financial Resource Strain: Low Risk  (08/01/2022)  Physical Activity: Inactive (08/01/2022)  Social Connections: Unknown (01/23/2023)   Received from [facility]  Stress: No Stress Concern Present (01/23/2023)   Received from [facility]  Tobacco Use: Medium Risk (01/30/2023)    Readmission Risk Interventions     No data to display

## 2023-02-04 NOTE — Progress Notes (Addendum)
      [address]       [employer] [place] [address]             [phone]        5 Days Post-Op Procedure(s) (LRB): CORONARY ARTERY BYPASS GRAFTING TIMES THREE USING LEFT INTERNAL MAMMARY ARTERY AND ENDOHARVEST OF RIGHT LEG GREATER SAPHENOUS VEIN (N/A) TRANSESOPHAGEAL ECHOCARDIOGRAM (N/A)  Subjective: He still has not had a bowel movement, despite multiple laxatives. He has no abdominal pain or nausea.  Objective: Vital signs in last 24 hours: Temp:  [98 F (36.7 C)-99.1 F (37.3 C)] 99 F (37.2 C) (09/25 0448) Pulse Rate:  [76-78] 77 (09/25 0448) Cardiac Rhythm: Atrial fibrillation;Atrial flutter (09/25 0138) BP: (104-119)/(52-70) 119/64 (09/25 0448) SpO2:  [98 %-100 %] 98 % (09/25 0448) Weight:  [92.8 kg] 92.8 kg (09/25 0448)  Pre op weight 90.6 kg Current Weight  02/04/23 92.8 kg      Intake/Output from previous day: 09/24 0701 - 09/25 0700 In: 1000 [P.O.:1000] Out: 250 [Urine:250]   Physical Exam:  Cardiovascular: RRR Pulmonary: Clear to ausculation bilaterally Abdomen: Soft, non tender, bowel sounds present. Extremities: Trace bilateral lower extremity edema. Mild ecchymosis right thigh. Wounds: Clean and dry.  No erythema or signs of infection.  Lab Results: CBC: Recent Labs    02/02/23 0651  WBC 6.8  HGB 8.8*  HCT 27.2*  PLT 107*   BMET:  Recent Labs    02/02/23 0651  NA 136  K 4.2  CL 105  CO2 21*  GLUCOSE 155*  BUN 22  CREATININE 1.49*  CALCIUM 8.3*    PT/INR:  Lab Results  Component Value Date   INR 1.4 (H) 01/30/2023   INR 1.0 01/25/2023   INR 1.4 (H) 12/11/2022   ABG:  INR: Will add last result for INR, ABG once components are confirmed Will add last 4 CBG results once components are confirmed  Assessment/Plan:  1. CV - S/p NSTEMI. Prevous a fib.  He appears to have had a fib/fl with HR in the 140's earlier this am. On Amiodarone 400 mg bid and Plavix. SBP improving so will try low dose Lopressor. Hope to  avoid anticoagulation 2.  Pulmonary - On room air.  Mucinex for cough. Encourage incentive spirometer 3. Volume Overload - Has been given Lasix the last several days 4.  Expected post op acute blood loss anemia - Last H and H stable at 8.8 and 27.2 5. DM-CBGs 126/123/117. On Insulin PRN. Will restart Synjardy closer to discharge. Pre op HGA1C 6.3 6. Thrombocytopenia-Last platelets 107,000 7. History of CKD (stage III)-Last creatinine 1.49. Baseline creatinine appears around 1.3-1.4 8. Sundowning-He was not agitated but was confused again last night.  9. LOC constipation-Will try Dulcolax suppository later today 10. Disposition-per patient, discussed with family and patient neesd SNF at discharge. Deconditioned-continue with PT/OT  Lelon Huh ZimmermanPA-C 6:58 AM   Chart reviewed, patient examined, agree with above. He seems fine this am as far as mental status goes. Still no BM despite multiple laxatives preop and postop. This is a chronic problem for him. Planning SNF but have to get his bowels working and make sure he is moving around well enough to mobilize at [facility].

## 2023-02-04 NOTE — Plan of Care (Signed)
Problem: Education: Goal: Knowledge of General Education information will improve Description: Including pain rating scale, medication(s)/side effects and non-pharmacologic comfort measures Outcome: Progressing   Problem: Health Behavior/Discharge Planning: Goal: Ability to manage health-related needs will improve Outcome: Progressing   Problem: Clinical Measurements: Goal: Ability to maintain clinical measurements within normal limits will improve Outcome: Progressing Goal: Will remain free from infection Outcome: Progressing Goal: Diagnostic test results will improve Outcome: Progressing Goal: Respiratory complications will improve Outcome: Progressing Goal: Cardiovascular complication will be avoided Outcome: Progressing   Problem: Activity: Goal: Risk for activity intolerance will decrease Outcome: Progressing   Problem: Nutrition: Goal: Adequate nutrition will be maintained Outcome: Progressing   Problem: Coping: Goal: Level of anxiety will decrease Outcome: Progressing   Problem: Elimination: Goal: Will not experience complications related to bowel motility Outcome: Progressing Goal: Will not experience complications related to urinary retention Outcome: Progressing   Problem: Pain Managment: Goal: General experience of comfort will improve Outcome: Progressing   Problem: Safety: Goal: Ability to remain free from injury will improve Outcome: Progressing   Problem: Skin Integrity: Goal: Risk for impaired skin integrity will decrease Outcome: Progressing   Problem: Education: Goal: Understanding of cardiac disease, CV risk reduction, and recovery process will improve Outcome: Progressing Goal: Individualized Educational Video(s) Outcome: Progressing   Problem: Activity: Goal: Ability to tolerate increased activity will improve Outcome: Progressing   Problem: Cardiac: Goal: Ability to achieve and maintain adequate cardiovascular perfusion will  improve Outcome: Progressing   Problem: Health Behavior/Discharge Planning: Goal: Ability to safely manage health-related needs after discharge will improve Outcome: Progressing   Problem: Education: Goal: Understanding of CV disease, CV risk reduction, and recovery process will improve Outcome: Progressing Goal: Individualized Educational Video(s) Outcome: Progressing   Problem: Activity: Goal: Ability to return to baseline activity level will improve Outcome: Progressing   Problem: Cardiovascular: Goal: Ability to achieve and maintain adequate cardiovascular perfusion will improve Outcome: Progressing Goal: Vascular access site(s) Level 0-1 will be maintained Outcome: Progressing   Problem: Health Behavior/Discharge Planning: Goal: Ability to safely manage health-related needs after discharge will improve Outcome: Progressing   Problem: Education: Goal: Ability to describe self-care measures that may prevent or decrease complications (Diabetes Survival Skills Education) will improve Outcome: Progressing Goal: Individualized Educational Video(s) Outcome: Progressing   Problem: Coping: Goal: Ability to adjust to condition or change in health will improve Outcome: Progressing   Problem: Fluid Volume: Goal: Ability to maintain a balanced intake and output will improve Outcome: Progressing   Problem: Health Behavior/Discharge Planning: Goal: Ability to identify and utilize available resources and services will improve Outcome: Progressing Goal: Ability to manage health-related needs will improve Outcome: Progressing   Problem: Metabolic: Goal: Ability to maintain appropriate glucose levels will improve Outcome: Progressing   Problem: Nutritional: Goal: Maintenance of adequate nutrition will improve Outcome: Progressing Goal: Progress toward achieving an optimal weight will improve Outcome: Progressing   Problem: Skin Integrity: Goal: Risk for impaired skin  integrity will decrease Outcome: Progressing   Problem: Tissue Perfusion: Goal: Adequacy of tissue perfusion will improve Outcome: Progressing   Problem: Education: Goal: Will demonstrate proper wound care and an understanding of methods to prevent future damage Outcome: Progressing Goal: Knowledge of disease or condition will improve Outcome: Progressing Goal: Knowledge of the prescribed therapeutic regimen will improve Outcome: Progressing Goal: Individualized Educational Video(s) Outcome: Progressing   Problem: Activity: Goal: Risk for activity intolerance will decrease Outcome: Progressing   Problem: Cardiac: Goal: Will achieve and/or maintain hemodynamic stability Outcome: Progressing  Problem: Clinical Measurements: Goal: Postoperative complications will be avoided or minimized Outcome: Progressing   Problem: Respiratory: Goal: Respiratory status will improve Outcome: Progressing

## 2023-02-04 NOTE — TOC Progression Note (Signed)
Transition of Care [facility]) - Progression Note    Patient Details  Name: Jon [name] MRN: [id number] Date of Birth: 1946/03/19  [facility]) CM/SW Contact  Eduard Roux, [place] Phone Number: 02/04/2023, 8:47 AM  Clinical Narrative:     SNF referral sent to [facility] as requested by family.  Expected Discharge Plan: Skilled Nursing Facility Barriers to Discharge: Continued Medical Work up  Expected Discharge Plan and Services In-house Referral: Clinical Social Work Discharge Planning Services: CM Consult Post Acute Care Choice: NA Living arrangements for the past 2 months: Apartment                 DME Arranged: N/A DME Agency: NA       HH Arranged: NA           Social Determinants of Health (SDOH) Interventions SDOH Screenings   Food Insecurity: No Food Insecurity (01/25/2023)  Housing: Low Risk  (01/25/2023)  Transportation Needs: No Transportation Needs (01/25/2023)  Utilities: Not At Risk (01/25/2023)  Alcohol Screen: Low Risk  (08/01/2022)  Depression (PHQ2-9): Low Risk  (12/04/2022)  Financial Resource Strain: Low Risk  (08/01/2022)  Physical Activity: Inactive (08/01/2022)  Social Connections: Unknown (01/23/2023)   Received from [facility]  Stress: No Stress Concern Present (01/23/2023)   Received from [facility]  Tobacco Use: Medium Risk (01/30/2023)    Readmission Risk Interventions     No data to display

## 2023-02-05 DIAGNOSIS — I200 Unstable angina: Secondary | ICD-10-CM | POA: Diagnosis not present

## 2023-02-05 LAB — GLUCOSE, CAPILLARY
Glucose-Capillary: 139 mg/dL — ABNORMAL HIGH (ref 70–99)
Glucose-Capillary: 139 mg/dL — ABNORMAL HIGH (ref 70–99)
Glucose-Capillary: 175 mg/dL — ABNORMAL HIGH (ref 70–99)
Glucose-Capillary: 145 mg/dL — ABNORMAL HIGH (ref 70–99)

## 2023-02-05 LAB — BASIC METABOLIC PANEL
Anion gap: 7 (ref 5–15)
BUN: 22 mg/dL (ref 8–23)
CO2: 26 mmol/L (ref 22–32)
Calcium: 8.4 mg/dL — ABNORMAL LOW (ref 8.9–10.3)
Chloride: 104 mmol/L (ref 98–111)
Creatinine, Ser: 1.44 mg/dL — ABNORMAL HIGH (ref 0.61–1.24)
GFR, Estimated: 50 mL/min — ABNORMAL LOW (ref >60)
Glucose, Bld: 151 mg/dL — ABNORMAL HIGH (ref 70–99)
Potassium: 4 mmol/L (ref 3.5–5.1)
Sodium: 137 mmol/L (ref 135–145)

## 2023-02-05 LAB — CBC
HCT: 25.7 % — ABNORMAL LOW (ref 39.0–52.0)
Hemoglobin: 8.6 g/dL — ABNORMAL LOW (ref 13.0–17.0)
MCH: 33.1 pg (ref 26.0–34.0)
MCHC: 33.5 g/dL (ref 30.0–36.0)
MCV: 98.8 fL (ref 80.0–100.0)
Platelets: 192 10*3/uL (ref 150–400)
RBC: 2.6 MIL/uL — ABNORMAL LOW (ref 4.22–5.81)
RDW: 13.7 % (ref 11.5–15.5)
WBC: 5.8 10*3/uL (ref 4.0–10.5)
nRBC: 0 % (ref 0.0–0.2)

## 2023-02-05 MED ORDER — POTASSIUM CHLORIDE CRYS ER 20 MEQ PO TBCR
20.0000 meq | EXTENDED_RELEASE_TABLET | Freq: Every day | ORAL | Status: DC
  Administered 2023-02-05 – 2023-02-07 (×3): 20 meq via ORAL
  Filled 2023-02-05 (×3): qty 1

## 2023-02-05 MED ORDER — FUROSEMIDE 40 MG PO TABS
40.0000 mg | ORAL_TABLET | Freq: Every day | ORAL | Status: DC
  Administered 2023-02-05 – 2023-02-07 (×3): 40 mg via ORAL
  Filled 2023-02-05 (×3): qty 1

## 2023-02-05 NOTE — Progress Notes (Signed)
Physical Therapy Treatment Patient Details Name: Jon [name] MRN: [id number] DOB: 04-Mar-1946 Today's Date: 02/05/2023   History of Present Illness Jon [name] is a 77 y.o. male s/p CABGx3 on 9/20.  PMH: multivessel CAD with prior recommendation for CABG evaluation with recent inferior ST elevation MI status post PCI/DES to the RCA in 12/2022, left subclavian artery stenosis with associated subclavian steal status post stenting in 12/2021, remote CVA/TIA, DM2, HTN, HLD, prior tobacco use with likely emphysema    PT Comments  Pt received in supine and agreeable to session with encouragement. Pt tangential throughout session requiring redirection and demonstrates some confusion. Pt continues to require frequent cues to maintain sternal precautions and for safety. Pt able to demonstrate improved power up to stand and tolerate increased gait distance this session. Pt continues to be limited by DOE and require seated rest breaks. Education provided on importance of mobility throughout the day and increasing activity level with staff. Pt continues to benefit from PT services to progress toward functional mobility goals.     If plan is discharge home, recommend the following: A lot of help with walking and/or transfers;A lot of help with bathing/dressing/bathroom;Assist for transportation;Help with stairs or ramp for entrance;Assistance with cooking/housework   Can travel by private vehicle        Equipment Recommendations   (TBD next facility)    Recommendations for Other Services       Precautions / Restrictions Precautions Precautions: Fall;Sternal Precaution Comments: continues to need reinforcement of sternal precautions Restrictions Weight Bearing Restrictions: Yes Other Position/Activity Restrictions: sternal precautions     Mobility  Bed Mobility Overal bed mobility: Needs Assistance Bed Mobility: Supine to Sit     Supine to sit: HOB elevated, Mod assist      General bed mobility comments: mod A for trunk elevation and dense cues to maintain sternal precautions despite pt hugging heart pillow    Transfers Overall transfer level: Needs assistance Equipment used: Rolling walker (2 wheels) Transfers: Sit to/from Stand Sit to Stand: Contact guard assist, Min assist, From elevated surface           General transfer comment: STS from elevated EOB x3 with CGA for safety and cues for hand placement. Min A to stand from lower chair    Ambulation/Gait Ambulation/Gait assistance: Contact guard assist Gait Distance (Feet): 75 Feet (+20) Assistive device: Rolling walker (2 wheels) Gait Pattern/deviations: Step-through pattern, Trunk flexed, Decreased stride length Gait velocity: slow     General Gait Details: Pt demonstrating DOE requiring 2 seated rest breaks. Cues for RW proximity and safety due to some impulsivity and pt becoming distracted and lifting one hand off of the RW intermittently      Balance Overall balance assessment: Needs assistance Sitting-balance support: Feet supported, No upper extremity supported Sitting balance-Leahy Scale: Fair Sitting balance - Comments: sitting EOB   Standing balance support: Reliant on assistive device for balance, During functional activity, Bilateral upper extremity supported Standing balance-Leahy Scale: Poor Standing balance comment: with RW support                            Cognition Arousal: Alert Behavior During Therapy: Impulsive Overall Cognitive Status: No family/caregiver present to determine baseline cognitive functioning                                 General Comments: Pt reports that  he has been told about his recent difficulty with short term memory and states he is hallucinating that it is 11:30pm instead of am. Pt tangential throughout session requiring redirection to mobility tasks and cues for safety throughout.        Exercises      General  Comments        Pertinent Vitals/Pain Pain Assessment Pain Assessment: Faces Faces Pain Scale: Hurts a little bit Pain Location: sternal incision Pain Descriptors / Indicators: Sore, Aching Pain Intervention(s): Monitored during session, Repositioned     PT Goals (current goals can now be found in the care plan section) Acute Rehab PT Goals Patient Stated Goal: get stronger PT Goal Formulation: With patient/family Time For Goal Achievement: 02/16/23 Progress towards PT goals: Progressing toward goals    Frequency    Min 1X/week       AM-PAC PT "6 Clicks" Mobility   Outcome Measure  Help needed turning from your back to your side while in a flat bed without using bedrails?: A Little Help needed moving from lying on your back to sitting on the side of a flat bed without using bedrails?: A Lot Help needed moving to and from a bed to a chair (including a wheelchair)?: A Little Help needed standing up from a chair using your arms (e.g., wheelchair or bedside chair)?: A Little Help needed to walk in hospital room?: A Little Help needed climbing 3-5 steps with a railing? : A Lot 6 Click Score: 16    End of Session Equipment Utilized During Treatment: Gait belt Activity Tolerance: Patient limited by fatigue;Patient tolerated treatment well Patient left: in chair;with chair alarm set;with call bell/phone within reach Nurse Communication: Mobility status PT Visit Diagnosis: Unsteadiness on feet (R26.81);Muscle weakness (generalized) (M62.81);Difficulty in walking, not elsewhere classified (R26.2)     Time: 1914-7829 PT Time Calculation (min) (ACUTE ONLY): 27 min  Charges:    $Gait Training: 8-22 mins $Therapeutic Activity: 8-22 mins PT General Charges $$ ACUTE PT VISIT: 1 Visit                     [name], PTA Acute Rehabilitation Services Secure Chat Preferred  [phone]    [name] 02/05/2023, 12:49 PM

## 2023-02-05 NOTE — Progress Notes (Signed)
Patient woke confused and did not know where he was. When asked about his location he mentioned to me he was in a cruise ship and his roommates have left. He later informed me he was at [facility].

## 2023-02-05 NOTE — Progress Notes (Addendum)
      [address]       [place] [address]             [phone]        6 Days Post-Op Procedure(s) (LRB): CORONARY ARTERY BYPASS GRAFTING TIMES THREE USING LEFT INTERNAL MAMMARY ARTERY AND ENDOHARVEST OF RIGHT LEG GREATER SAPHENOUS VEIN (N/A) TRANSESOPHAGEAL ECHOCARDIOGRAM (N/A)  Subjective: He had confusion earlier this am. He was very con versive this am, which he has been. He does not recall receiving dulcolax suppository yesterday. He did have complaints of right ear/neck  discomfort (he had a tumor removed years ago) and there was a small scab from previous right internal jugular line (but no signs of infection).  Objective: Vital signs in last 24 hours: Temp:  [97.6 F (36.4 C)-99.1 F (37.3 C)] 99 F (37.2 C) (09/26 0612) Pulse Rate:  [65-98] 68 (09/26 0612) Cardiac Rhythm: Normal sinus rhythm (09/25 2000) Resp:  [19-20] 20 (09/26 0612) BP: (99-150)/(59-76) 119/65 (09/26 0612) SpO2:  [92 %-98 %] 93 % (09/26 0612)  Pre op weight 90.6 kg Current Weight  02/04/23 92.8 kg      Intake/Output from previous day: 09/25 0701 - 09/26 0700 In: 240 [P.O.:240] Out: 125 [Urine:125]   Physical Exam:  Cardiovascular: RRR Pulmonary: Clear to ausculation bilaterally Abdomen: Soft, non tender, bowel sounds present. Extremities: Trace bilateral lower extremity edema. Mild ecchymosis right thigh. Wounds: Clean and dry.  No erythema or signs of infection.  Lab Results: CBC: Recent Labs    02/05/23 0321  WBC 5.8  HGB 8.6*  HCT 25.7*  PLT 192   BMET:  Recent Labs    02/05/23 0321  NA 137  K 4.0  CL 104  CO2 26  GLUCOSE 151*  BUN 22  CREATININE 1.44*  CALCIUM 8.4*    PT/INR:  Lab Results  Component Value Date   INR 1.4 (H) 01/30/2023   INR 1.0 01/25/2023   INR 1.4 (H) 12/11/2022   ABG:  INR: Will add last result for INR, ABG once components are confirmed Will add last 4 CBG results once components are  confirmed  Assessment/Plan:  1. CV - S/p NSTEMI. PAF. He has been having small bursts of a fib. If becomes more constant, may need to consider anticoagulation.  On Lopressor 12.5 mg bid, Amiodarone 400 mg bid and Plavix.  2.  Pulmonary - On room air.  Mucinex for cough. Encourage incentive spirometer 3. Volume Overload - Has been given Lasix the last several days 4.  Expected post op acute blood loss anemia -H and H this am stable at 8.6 and 25.7 5. DM-CBGs 152/164/139. On Insulin PRN. Will restart Synjardy closer to discharge. Pre op HGA1C 6.3 6. Thrombocytopenia resolved-Platelets this am up to 192,000 7. History of CKD (stage III)-Creatinine  this am 1.44. Baseline creatinine appears around 1.3-1.4 8. Sundowning-He was not agitated but was confused again last night.  9. LOC constipation-may need enema. Of note, he has had constipation  in the past. Dulcolax suppository was ordered yesterday but not given (per medication record) as patient had bowel movement? 10. Disposition-per patient, discussed with family and patient needs SNF at discharge. Deconditioned-continue with PT/OT  Donielle M ZimmermanPA-C 6:59 AM   Chart reviewed, patient examined, agree with above.

## 2023-02-05 NOTE — Progress Notes (Signed)
Mobility Specialist Progress Note:   02/05/23 1526  Mobility  Activity Refused mobility   Pt refused mobility, stated no specific reason. Will f/u as able.    Leory Plowman  Mobility Specialist Please contact via [employer] office at [phone]

## 2023-02-05 NOTE — Plan of Care (Signed)
Problem: Education: Goal: Knowledge of General Education information will improve Description: Including pain rating scale, medication(s)/side effects and non-pharmacologic comfort measures Outcome: Progressing   Problem: Health Behavior/Discharge Planning: Goal: Ability to manage health-related needs will improve Outcome: Progressing   Problem: Clinical Measurements: Goal: Ability to maintain clinical measurements within normal limits will improve Outcome: Progressing Goal: Will remain free from infection Outcome: Progressing Goal: Diagnostic test results will improve Outcome: Progressing Goal: Respiratory complications will improve Outcome: Progressing Goal: Cardiovascular complication will be avoided Outcome: Progressing   Problem: Activity: Goal: Risk for activity intolerance will decrease Outcome: Progressing   Problem: Nutrition: Goal: Adequate nutrition will be maintained Outcome: Progressing   Problem: Coping: Goal: Level of anxiety will decrease Outcome: Progressing   Problem: Elimination: Goal: Will not experience complications related to bowel motility Outcome: Progressing Goal: Will not experience complications related to urinary retention Outcome: Progressing   Problem: Pain Managment: Goal: General experience of comfort will improve Outcome: Progressing   Problem: Safety: Goal: Ability to remain free from injury will improve Outcome: Progressing   Problem: Skin Integrity: Goal: Risk for impaired skin integrity will decrease Outcome: Progressing   Problem: Education: Goal: Understanding of cardiac disease, CV risk reduction, and recovery process will improve Outcome: Progressing Goal: Individualized Educational Video(s) Outcome: Progressing   Problem: Activity: Goal: Ability to tolerate increased activity will improve Outcome: Progressing   Problem: Cardiac: Goal: Ability to achieve and maintain adequate cardiovascular perfusion will  improve Outcome: Progressing   Problem: Health Behavior/Discharge Planning: Goal: Ability to safely manage health-related needs after discharge will improve Outcome: Progressing   Problem: Education: Goal: Understanding of CV disease, CV risk reduction, and recovery process will improve Outcome: Progressing Goal: Individualized Educational Video(s) Outcome: Progressing   Problem: Activity: Goal: Ability to return to baseline activity level will improve Outcome: Progressing   Problem: Cardiovascular: Goal: Ability to achieve and maintain adequate cardiovascular perfusion will improve Outcome: Progressing Goal: Vascular access site(s) Level 0-1 will be maintained Outcome: Progressing   Problem: Health Behavior/Discharge Planning: Goal: Ability to safely manage health-related needs after discharge will improve Outcome: Progressing   Problem: Education: Goal: Ability to describe self-care measures that may prevent or decrease complications (Diabetes Survival Skills Education) will improve Outcome: Progressing Goal: Individualized Educational Video(s) Outcome: Progressing   Problem: Coping: Goal: Ability to adjust to condition or change in health will improve Outcome: Progressing   Problem: Fluid Volume: Goal: Ability to maintain a balanced intake and output will improve Outcome: Progressing   Problem: Health Behavior/Discharge Planning: Goal: Ability to identify and utilize available resources and services will improve Outcome: Progressing Goal: Ability to manage health-related needs will improve Outcome: Progressing   Problem: Metabolic: Goal: Ability to maintain appropriate glucose levels will improve Outcome: Progressing   Problem: Nutritional: Goal: Maintenance of adequate nutrition will improve Outcome: Progressing Goal: Progress toward achieving an optimal weight will improve Outcome: Progressing   Problem: Skin Integrity: Goal: Risk for impaired skin  integrity will decrease Outcome: Progressing   Problem: Tissue Perfusion: Goal: Adequacy of tissue perfusion will improve Outcome: Progressing   Problem: Education: Goal: Will demonstrate proper wound care and an understanding of methods to prevent future damage Outcome: Progressing Goal: Knowledge of disease or condition will improve Outcome: Progressing Goal: Knowledge of the prescribed therapeutic regimen will improve Outcome: Progressing Goal: Individualized Educational Video(s) Outcome: Progressing   Problem: Activity: Goal: Risk for activity intolerance will decrease Outcome: Progressing   Problem: Cardiac: Goal: Will achieve and/or maintain hemodynamic stability Outcome: Progressing  Problem: Clinical Measurements: Goal: Postoperative complications will be avoided or minimized Outcome: Progressing   Problem: Respiratory: Goal: Respiratory status will improve Outcome: Progressing

## 2023-02-05 NOTE — TOC Progression Note (Signed)
Transition of Care [facility]) - Progression Note    Patient Details  Name: Jon [name] MRN: [id number] Date of Birth: Jul 09, 1945  Transition of Care [facility]) CM/SW Contact  Eduard Roux, [place] Phone Number: 02/05/2023, 12:57 PM  Clinical Narrative:     Insurance authorization is approved reference # [id number] from  09/27-10/01  Antony Blackbird, MSW, LCSW Clinical Social Worker     Expected Discharge Plan: Skilled Nursing Facility Barriers to Discharge: Continued Medical Work up  Expected Discharge Plan and Services In-house Referral: Clinical Social Work Discharge Planning Services: CM Consult Post Acute Care Choice: NA Living arrangements for the past 2 months: Apartment                 DME Arranged: N/A DME Agency: NA       HH Arranged: NA           Social Determinants of Health (SDOH) Interventions SDOH Screenings   Food Insecurity: No Food Insecurity (01/25/2023)  Housing: Low Risk  (01/25/2023)  Transportation Needs: No Transportation Needs (01/25/2023)  Utilities: Not At Risk (01/25/2023)  Alcohol Screen: Low Risk  (08/01/2022)  Depression (PHQ2-9): Low Risk  (12/04/2022)  Financial Resource Strain: Low Risk  (08/01/2022)  Physical Activity: Inactive (08/01/2022)  Social Connections: Unknown (01/23/2023)   Received from [facility]  Stress: No Stress Concern Present (01/23/2023)   Received from [facility]  Tobacco Use: Medium Risk (01/30/2023)    Readmission Risk Interventions     No data to display

## 2023-02-05 NOTE — Progress Notes (Signed)
   Patient Name: Jon [name] Date of Encounter: 02/05/2023 [facility] Cardiologist: [name], MD   Interval Summary  .    No acute events overnight.  More alert and conversant.  Remains in NSR  Vital Signs .    Vitals:   02/04/23 2322 02/05/23 0612 02/05/23 0743 02/05/23 1206  BP: 111/65 119/65 (!) 99/51 119/60  Pulse: 67 68 75   Resp: 20 20 18 18   Temp: 99.1 F (37.3 C) 99 F (37.2 C) 98.7 F (37.1 C) 97.9 F (36.6 C)  TempSrc: Oral Oral Oral Oral  SpO2: 92% 93% 100%   Weight:      Height:        Intake/Output Summary (Last 24 hours) at 02/05/2023 1230 Last data filed at 02/05/2023 0800 Gross per 24 hour  Intake 480 ml  Output 500 ml  Net -20 ml      02/04/2023    4:48 AM 02/03/2023    6:48 AM 02/03/2023    4:58 AM  Last 3 Weights  Weight (lbs) 204 lb 9.4 oz 202 lb 9.6 oz --  Weight (kg) 92.8 kg 91.9 kg --      Telemetry/ECG    SR Personally Reviewed  Physical Exam .   GEN: No acute distress.   Neck: No JVD Cardiac: RRR, no murmurs, rubs, or gallops.  Respiratory: Clear to auscultation bilaterally. GI: Soft, nontender, non-distended  MS: No edema  Assessment & Plan .     77 year old with acute inferior MI in August 2024 with RCA stent placement transferred here for CABG   Coronary artery disease/CABG - Cont plavix, ASA, atorvastatin  2.  Postoperative atrial fibrillation - Cont amiodarone, remains in NSR overnight; no need for A/C currently  3.  Hyperlipidemia - On atorvastatin 80 mg a day with LDL goal less than 55  4.  Chronic kidney disease stage IIIa --Cr stable; euvolemic  For questions or updates, please contact [facility] Please consult [email or web address] for contact info under        Signed, Orbie Pyo, MD

## 2023-02-06 LAB — GLUCOSE, CAPILLARY
Glucose-Capillary: 158 mg/dL — ABNORMAL HIGH (ref 70–99)
Glucose-Capillary: 157 mg/dL — ABNORMAL HIGH (ref 70–99)
Glucose-Capillary: 123 mg/dL — ABNORMAL HIGH (ref 70–99)
Glucose-Capillary: 113 mg/dL — ABNORMAL HIGH (ref 70–99)

## 2023-02-06 MED ORDER — SORBITOL 70 % SOLN
30.0000 mL | Freq: Once | Status: AC
  Administered 2023-02-06: 30 mL via ORAL
  Filled 2023-02-06: qty 30

## 2023-02-06 NOTE — Progress Notes (Signed)
Mobility Specialist Progress Note:   02/06/23 1400  Mobility  Activity Ambulated with assistance in hallway  Level of Assistance Contact guard assist, steadying assist  Assistive Device Front wheel walker  Distance Ambulated (ft) 100 ft  RUE Weight Bearing NWB  LUE Weight Bearing NWB  Activity Response Tolerated well  Mobility Referral Yes  $Mobility charge 1 Mobility  Mobility Specialist Start Time (ACUTE ONLY) 1339  Mobility Specialist Stop Time (ACUTE ONLY) 1354  Mobility Specialist Time Calculation (min) (ACUTE ONLY) 15 min    Pre Mobility: 66 HR During Mobility: 70 HR Post Mobility:  68 HR  Pt received in bed, agreeable to mobility. Mod I for bed mobility and STS. C/o SOB, though audibly asymptomatic. VSS throughout. Pt left in bed with call bell and family present.  Jon [name] Mobility Specialist Please contact via Special educational needs teacher or Rehab office at [phone]

## 2023-02-06 NOTE — Progress Notes (Signed)
7 Days Post-Op Procedure(s) (LRB): CORONARY ARTERY BYPASS GRAFTING TIMES THREE USING LEFT INTERNAL MAMMARY ARTERY AND ENDOHARVEST OF RIGHT LEG GREATER SAPHENOUS VEIN (N/A) TRANSESOPHAGEAL ECHOCARDIOGRAM (N/A) Subjective:  Feels ok overall. Brain is in a different dimension at times. No BM yet. Eating well and walking.  Objective: Vital signs in last 24 hours: Temp:  [97.9 F (36.6 C)-99 F (37.2 C)] 99 F (37.2 C) (09/26 2035) Pulse Rate:  [68] 68 (09/26 2035) Cardiac Rhythm: Normal sinus rhythm (09/26 1924) Resp:  [17-18] 17 (09/26 1540) BP: (108-119)/(60-62) 108/62 (09/26 2035) SpO2:  [97 %] 97 % (09/26 2035)  Hemodynamic parameters for last 24 hours:    Intake/Output from previous day: 09/26 0701 - 09/27 0700 In: 240 [P.O.:240] Out: 300 [Urine:300] Intake/Output this shift: No intake/output data recorded.  General appearance: alert and cooperative Neurologic: intact Heart: regular rate and rhythm Lungs: clear to auscultation bilaterally Abdomen: soft, non-tender; bowel sounds normal Extremities: minimal edema Wound: incisions healing well  Lab Results: Recent Labs    02/05/23 0321  WBC 5.8  HGB 8.6*  HCT 25.7*  PLT 192   BMET:  Recent Labs    02/05/23 0321  NA 137  K 4.0  CL 104  CO2 26  GLUCOSE 151*  BUN 22  CREATININE 1.44*  CALCIUM 8.4*    PT/INR: No results for input(s): "LABPROT", "INR" in the last 72 hours. ABG    Component Value Date/Time   PHART 7.331 (L) 01/30/2023 2156   HCO3 16.9 (L) 01/30/2023 2156   TCO2 18 (L) 01/30/2023 2156   ACIDBASEDEF 8.0 (H) 01/30/2023 2156   O2SAT 99 01/30/2023 2156   CBG (last 3)  Recent Labs    02/05/23 1542 02/05/23 2103 02/06/23 0640  GLUCAP 175* 145* 158*    Assessment/Plan: S/P Procedure(s) (LRB): CORONARY ARTERY BYPASS GRAFTING TIMES THREE USING LEFT INTERNAL MAMMARY ARTERY AND ENDOHARVEST OF RIGHT LEG GREATER SAPHENOUS VEIN (N/A) TRANSESOPHAGEAL ECHOCARDIOGRAM (N/A)  POD 7  He has  been hemodynamically stable in sinus rhythm on po amio and Lopressor.   He has been on lasix with trace LE edema but no wt today. Will check. Probably does not need to stay on lasix. He has CKD.  No BM yet. Will give him sorbitol today.  He is ready for SNF if bowels move.   LOS: 12 days    [name] 02/06/2023

## 2023-02-07 LAB — GLUCOSE, CAPILLARY
Glucose-Capillary: 146 mg/dL — ABNORMAL HIGH (ref 70–99)
Glucose-Capillary: 154 mg/dL — ABNORMAL HIGH (ref 70–99)
Glucose-Capillary: 151 mg/dL — ABNORMAL HIGH (ref 70–99)
Glucose-Capillary: 205 mg/dL — ABNORMAL HIGH (ref 70–99)

## 2023-02-07 NOTE — Progress Notes (Addendum)
8 Days Post-Op Procedure(s) (LRB): CORONARY ARTERY BYPASS GRAFTING TIMES THREE USING LEFT INTERNAL MAMMARY ARTERY AND ENDOHARVEST OF RIGHT LEG GREATER SAPHENOUS VEIN (N/A) TRANSESOPHAGEAL ECHOCARDIOGRAM (N/A) Subjective: Some confusion, but re-orients some, hase some level of baseline dementia w/delerium  Objective: Vital signs in last 24 hours: Temp:  [97.7 F (36.5 C)-98.4 F (36.9 C)] 97.9 F (36.6 C) (09/28 0454) Pulse Rate:  [61-75] 63 (09/28 0651) Cardiac Rhythm: Normal sinus rhythm (09/28 0700) Resp:  [18-20] 19 (09/28 0651) BP: (96-112)/(46-92) 112/66 (09/28 0651) SpO2:  [96 %-99 %] 96 % (09/28 0651)  Hemodynamic parameters for last 24 hours:    Intake/Output from previous day: 09/27 0701 - 09/28 0700 In: 120 [P.O.:120] Out: 700 [Urine:700] Intake/Output this shift: No intake/output data recorded.  General appearance: alert, cooperative, distracted, and no distress Heart: regular rate and rhythm Lungs: clear to auscultation bilaterally Abdomen: benign Extremities: no edema Wound: incis healing well  Lab Results: Recent Labs    02/05/23 0321  WBC 5.8  HGB 8.6*  HCT 25.7*  PLT 192   BMET:  Recent Labs    02/05/23 0321  NA 137  K 4.0  CL 104  CO2 26  GLUCOSE 151*  BUN 22  CREATININE 1.44*  CALCIUM 8.4*    PT/INR: No results for input(s): "LABPROT", "INR" in the last 72 hours. ABG    Component Value Date/Time   PHART 7.331 (L) 01/30/2023 2156   HCO3 16.9 (L) 01/30/2023 2156   TCO2 18 (L) 01/30/2023 2156   ACIDBASEDEF 8.0 (H) 01/30/2023 2156   O2SAT 99 01/30/2023 2156   CBG (last 3)  Recent Labs    02/06/23 1620 02/06/23 1947 02/07/23 0647  GLUCAP 123* 113* 146*    Meds Scheduled Meds:  amiodarone  400 mg Oral BID   aspirin EC  81 mg Oral Daily   atorvastatin  80 mg Oral QHS   bisacodyl  10 mg Rectal Once   clopidogrel  75 mg Oral Daily   Fe Fum-Vit C-Vit B12-FA  1 capsule Oral QPC breakfast   furosemide  40 mg Oral Daily    guaiFENesin  600 mg Oral BID   insulin aspart  0-24 Units Subcutaneous TID AC & HS   metoprolol tartrate  12.5 mg Oral BID   pantoprazole  40 mg Oral Daily   potassium chloride  20 mEq Oral Daily   sodium chloride flush  3 mL Intravenous Q12H   Continuous Infusions:  sodium chloride     PRN Meds:.sodium chloride, lactulose, ondansetron **OR** ondansetron (ZOFRAN) IV, mouth rinse, sodium chloride flush, traZODone  Xrays No results found.  Assessment/Plan: S/P Procedure(s) (LRB): CORONARY ARTERY BYPASS GRAFTING TIMES THREE USING LEFT INTERNAL MAMMARY ARTERY AND ENDOHARVEST OF RIGHT LEG GREATER SAPHENOUS VEIN (N/A) TRANSESOPHAGEAL ECHOCARDIOGRAM (N/A) POD#8  1 afeb, VSS, s BP 90's-110's, sinus rhythm 2 O2 sats good on RA 3 not weighed, voiding, not all measured 4 had an unwitnessed fall, no apparent injury, confusion at times 5 unsure if he has had a BM 6 SNF at D/C      LOS: 13 days    Rowe Clack PA-C Pager [phone] 02/07/2023 Patient seen and examined, agree with findings and plan outlined above Confusion remains an issue  Viviann Spare C. Dorris Fetch, MD [facility] [phone]

## 2023-02-07 NOTE — Progress Notes (Signed)
Went to check pt's Blood sugar and tried to wake him up for weight. Pt seemed extremely tired, was having hard time waking up. Didn't want to talk. He was opening his eye, looking at me and falling back sleep. Another Charity fundraiser and charge RN called to the room. Pt was talking and awake around 5am when vitals was taken. At first pt did not follow command. Stared at all of [place], would not talk. Bilateral pupil 3 reactive to light, round and brisk, eyes could follow /track finger . Moving all extremity equally. Pt stated his throat hurts and want some coffee. When asked, pt stated he wants 3 cream and 3 sugar. No slurred speech noted. CT surgery PA Gershon Crane was on the floor, notified. I was little concerned about his unwitnessed fall last night, no visible injury post fall , no CT was done. This morning he seemed very tried, pt hasn't been sleeping night before last per report. Deniece Portela stated he will check on patient. Reported to day shift RN. Bed alarm on, floor mat in place.

## 2023-02-07 NOTE — Plan of Care (Signed)
Problem: Education: Goal: Knowledge of General Education information will improve Description: Including pain rating scale, medication(s)/side effects and non-pharmacologic comfort measures Outcome: Progressing   Problem: Health Behavior/Discharge Planning: Goal: Ability to manage health-related needs will improve Outcome: Progressing   Problem: Clinical Measurements: Goal: Ability to maintain clinical measurements within normal limits will improve Outcome: Progressing Goal: Will remain free from infection Outcome: Progressing Goal: Diagnostic test results will improve Outcome: Progressing Goal: Respiratory complications will improve Outcome: Progressing Goal: Cardiovascular complication will be avoided Outcome: Progressing   Problem: Activity: Goal: Risk for activity intolerance will decrease Outcome: Progressing   Problem: Nutrition: Goal: Adequate nutrition will be maintained Outcome: Progressing   Problem: Coping: Goal: Level of anxiety will decrease Outcome: Progressing   Problem: Elimination: Goal: Will not experience complications related to bowel motility Outcome: Progressing Goal: Will not experience complications related to urinary retention Outcome: Progressing   Problem: Pain Managment: Goal: General experience of comfort will improve Outcome: Progressing   Problem: Safety: Goal: Ability to remain free from injury will improve Outcome: Progressing   Problem: Skin Integrity: Goal: Risk for impaired skin integrity will decrease Outcome: Progressing   Problem: Education: Goal: Understanding of cardiac disease, CV risk reduction, and recovery process will improve Outcome: Progressing Goal: Individualized Educational Video(s) Outcome: Progressing   Problem: Activity: Goal: Ability to tolerate increased activity will improve Outcome: Progressing   Problem: Cardiac: Goal: Ability to achieve and maintain adequate cardiovascular perfusion will  improve Outcome: Progressing   Problem: Health Behavior/Discharge Planning: Goal: Ability to safely manage health-related needs after discharge will improve Outcome: Progressing   Problem: Education: Goal: Understanding of CV disease, CV risk reduction, and recovery process will improve Outcome: Progressing Goal: Individualized Educational Video(s) Outcome: Progressing   Problem: Activity: Goal: Ability to return to baseline activity level will improve Outcome: Progressing   Problem: Cardiovascular: Goal: Ability to achieve and maintain adequate cardiovascular perfusion will improve Outcome: Progressing Goal: Vascular access site(s) Level 0-1 will be maintained Outcome: Progressing   Problem: Health Behavior/Discharge Planning: Goal: Ability to safely manage health-related needs after discharge will improve Outcome: Progressing   Problem: Education: Goal: Ability to describe self-care measures that may prevent or decrease complications (Diabetes Survival Skills Education) will improve Outcome: Progressing Goal: Individualized Educational Video(s) Outcome: Progressing   Problem: Coping: Goal: Ability to adjust to condition or change in health will improve Outcome: Progressing   Problem: Fluid Volume: Goal: Ability to maintain a balanced intake and output will improve Outcome: Progressing   Problem: Health Behavior/Discharge Planning: Goal: Ability to identify and utilize available resources and services will improve Outcome: Progressing Goal: Ability to manage health-related needs will improve Outcome: Progressing   Problem: Metabolic: Goal: Ability to maintain appropriate glucose levels will improve Outcome: Progressing   Problem: Nutritional: Goal: Maintenance of adequate nutrition will improve Outcome: Progressing Goal: Progress toward achieving an optimal weight will improve Outcome: Progressing   Problem: Skin Integrity: Goal: Risk for impaired skin  integrity will decrease Outcome: Progressing   Problem: Tissue Perfusion: Goal: Adequacy of tissue perfusion will improve Outcome: Progressing   Problem: Education: Goal: Will demonstrate proper wound care and an understanding of methods to prevent future damage Outcome: Progressing Goal: Knowledge of disease or condition will improve Outcome: Progressing Goal: Knowledge of the prescribed therapeutic regimen will improve Outcome: Progressing Goal: Individualized Educational Video(s) Outcome: Progressing   Problem: Activity: Goal: Risk for activity intolerance will decrease Outcome: Progressing   Problem: Cardiac: Goal: Will achieve and/or maintain hemodynamic stability Outcome: Progressing  Problem: Clinical Measurements: Goal: Postoperative complications will be avoided or minimized Outcome: Progressing   Problem: Respiratory: Goal: Respiratory status will improve Outcome: Progressing

## 2023-02-07 NOTE — Progress Notes (Signed)
Pt was confused but could be reoriented at the bedside report. Pt thought his phone was being hacked and we had him hostage, he was getting ready to call 911. When pt's daughter Elmarie Shiley called, she insured him that he needs to stay in the hospital this weekend and he wont be  released till Monday, and all the family is going to be there in the morning. Pt was reoriented, pt apologized , agreed to go back to bed and sleep. Bed alarm on. Floor mat in place. Call bell within reach, will continue to monitor.

## 2023-02-07 NOTE — Progress Notes (Signed)
2154: Bed alarm went off, pt was confused. HE wanted to eat, Pt was eating sitting on side of the bed, bed alarm was on. Pt tried to get up again, staff went to help the patinet and reoriented him. Unfortunately bed alarm was not turned on. Pt thought he was in golf course and tried to reach for his golf stick and fell. This RN heard a noise and entered the room, pt was sitting on the floor on his buttocks. Pt was attended couple of second after the fall. There was a chair right behind him. NO visible injury in head, back or buttocks. Post fall vitals stable, helped pt back to bed. Pt was able to stand up with 2 person assist, took couple steps. He was unstable on his feet which is his baseline. Bed alarm set to middle setting, all side rails up for safety, floor matt in place. Call bell within reach. Awaiting tele sitter. Instructed pt to not get up without any assistance.  2228: Dr. Dorris Fetch called and notified of pt's fall. Notified pt did not sustain any visible physical injury, no new orders at this time.MD suggested sitter and continue to monitor. Notified MD we are waiting on tele sitter and I will be keeping close eye.  2235: Pt's son Arlander Gillen called and notified of the event. Son was aware pt was struggling with confusion and delirium. Notified pt did not sustain any physical injury and vitals are stable. Answered all the questions and concerns. Plan of care continues.

## 2023-02-08 LAB — GLUCOSE, CAPILLARY
Glucose-Capillary: 145 mg/dL — ABNORMAL HIGH (ref 70–99)
Glucose-Capillary: 210 mg/dL — ABNORMAL HIGH (ref 70–99)
Glucose-Capillary: 120 mg/dL — ABNORMAL HIGH (ref 70–99)
Glucose-Capillary: 144 mg/dL — ABNORMAL HIGH (ref 70–99)

## 2023-02-08 MED ORDER — AMIODARONE HCL 200 MG PO TABS
200.0000 mg | ORAL_TABLET | Freq: Two times a day (BID) | ORAL | Status: DC
  Administered 2023-02-08 (×2): 200 mg via ORAL
  Filled 2023-02-08 (×2): qty 1

## 2023-02-08 NOTE — Progress Notes (Addendum)
9 Days Post-Op Procedure(s) (LRB): CORONARY ARTERY BYPASS GRAFTING TIMES THREE USING LEFT INTERNAL MAMMARY ARTERY AND ENDOHARVEST OF RIGHT LEG GREATER SAPHENOUS VEIN (N/A) TRANSESOPHAGEAL ECHOCARDIOGRAM (N/A) Subjective: Has had a BM, remains confused  Objective: Vital signs in last 24 hours: Temp:  [97.8 F (36.6 C)-98.4 F (36.9 C)] 98.1 F (36.7 C) (09/29 0749) Pulse Rate:  [64-65] 64 (09/28 1639) Cardiac Rhythm: Normal sinus rhythm (09/29 0700) Resp:  [15-20] 15 (09/29 0615) BP: (100-119)/(57-82) 100/57 (09/29 0749) SpO2:  [98 %-100 %] 100 % (09/29 0615)  Hemodynamic parameters for last 24 hours:    Intake/Output from previous day: 09/28 0701 - 09/29 0700 In: -  Out: 950 [Urine:950] Intake/Output this shift: No intake/output data recorded.  General appearance: alert, distracted, and confused Heart: regular rate and rhythm Lungs: clear to auscultation bilaterally Abdomen: benign Extremities: no edema Wound: incis healing well  Lab Results: No results for input(s): "WBC", "HGB", "HCT", "PLT" in the last 72 hours. BMET: No results for input(s): "NA", "K", "CL", "CO2", "GLUCOSE", "BUN", "CREATININE", "CALCIUM" in the last 72 hours.  PT/INR: No results for input(s): "LABPROT", "INR" in the last 72 hours. ABG    Component Value Date/Time   PHART 7.331 (L) 01/30/2023 2156   HCO3 16.9 (L) 01/30/2023 2156   TCO2 18 (L) 01/30/2023 2156   ACIDBASEDEF 8.0 (H) 01/30/2023 2156   O2SAT 99 01/30/2023 2156   CBG (last 3)  Recent Labs    02/07/23 1635 02/07/23 2110 02/08/23 0616  GLUCAP 151* 205* 145*    Meds Scheduled Meds:  amiodarone  400 mg Oral BID   aspirin EC  81 mg Oral Daily   atorvastatin  80 mg Oral QHS   bisacodyl  10 mg Rectal Once   clopidogrel  75 mg Oral Daily   Fe Fum-Vit C-Vit B12-FA  1 capsule Oral QPC breakfast   furosemide  40 mg Oral Daily   guaiFENesin  600 mg Oral BID   insulin aspart  0-24 Units Subcutaneous TID AC & HS   metoprolol  tartrate  12.5 mg Oral BID   pantoprazole  40 mg Oral Daily   potassium chloride  20 mEq Oral Daily   sodium chloride flush  3 mL Intravenous Q12H   Continuous Infusions:  sodium chloride     PRN Meds:.sodium chloride, lactulose, ondansetron **OR** ondansetron (ZOFRAN) IV, mouth rinse, sodium chloride flush, traZODone  Xrays No results found.  Assessment/Plan: S/P Procedure(s) (LRB): CORONARY ARTERY BYPASS GRAFTING TIMES THREE USING LEFT INTERNAL MAMMARY ARTERY AND ENDOHARVEST OF RIGHT LEG GREATER SAPHENOUS VEIN (N/A) TRANSESOPHAGEAL ECHOCARDIOGRAM (N/A) POD#9  1 afeb, VSS, NSR, will reduce amio to 200 bid 2 O2 sats good on RA 3 voiding, not weighed in a few days 4 BS mostly controlled, one reading>200, cont SSI for now, was on insulin and synjardy at home 5 will recheck BMET as may be able to restart synjardy if renal fxn ok 6 will d/c lasix and potassium 7 cont rehab and pulm hygiene- likely SNF soon     LOS: 14 days    Rowe Clack 02/08/2023 Patient seen and examined, agree with findings and plan outlined above  Viviann Spare C. Dorris Fetch, MD [facility] [phone]

## 2023-02-08 NOTE — Progress Notes (Signed)
Mobility Specialist Progress Note:   02/08/23 1205  Mobility  Activity Ambulated with assistance in hallway  Level of Assistance Minimal assist, patient does 75% or more  Assistive Device Front wheel walker  Distance Ambulated (ft) 30 ft  RUE Weight Bearing NWB  LUE Weight Bearing NWB  Activity Response Tolerated well  Mobility Referral Yes  $Mobility charge 1 Mobility  Mobility Specialist Start Time (ACUTE ONLY) 1120  Mobility Specialist Stop Time (ACUTE ONLY) 1144  Mobility Specialist Time Calculation (min) (ACUTE ONLY) 24 min   Pre Mobility: 66 HR  During Mobility: 71 HR Post Mobility: 67 HR   Pt received in bed, agreeable to mobility. Pt able to stand with SB assist. Displayed slight unsteadiness but no LOB. Seated rest break required d/t fatigue and DOE. Pursed lip breathing encouraged. Pt unable to continue further ambulation d/t leg weakness. Pt needing 2x attempts to perform STS from chair requiring ModA. Pt returned to bed asymptomatic with RN present in room. Bed alarm on.   Leory Plowman  Mobility Specialist Please contact via SecureChat Rehab office at [phone]

## 2023-02-09 ENCOUNTER — Encounter: Payer: Medicare Other | Admitting: Physical Therapy

## 2023-02-09 DIAGNOSIS — Z7982 Long term (current) use of aspirin: Secondary | ICD-10-CM | POA: Diagnosis not present

## 2023-02-09 DIAGNOSIS — R269 Unspecified abnormalities of gait and mobility: Secondary | ICD-10-CM | POA: Diagnosis not present

## 2023-02-09 DIAGNOSIS — I4891 Unspecified atrial fibrillation: Secondary | ICD-10-CM | POA: Diagnosis not present

## 2023-02-09 DIAGNOSIS — I129 Hypertensive chronic kidney disease with stage 1 through stage 4 chronic kidney disease, or unspecified chronic kidney disease: Secondary | ICD-10-CM | POA: Diagnosis not present

## 2023-02-09 DIAGNOSIS — R42 Dizziness and giddiness: Secondary | ICD-10-CM | POA: Diagnosis not present

## 2023-02-09 DIAGNOSIS — Z7401 Bed confinement status: Secondary | ICD-10-CM | POA: Diagnosis not present

## 2023-02-09 DIAGNOSIS — I25110 Atherosclerotic heart disease of native coronary artery with unstable angina pectoris: Secondary | ICD-10-CM | POA: Diagnosis not present

## 2023-02-09 DIAGNOSIS — R5381 Other malaise: Secondary | ICD-10-CM | POA: Diagnosis not present

## 2023-02-09 DIAGNOSIS — N183 Chronic kidney disease, stage 3 unspecified: Secondary | ICD-10-CM | POA: Diagnosis not present

## 2023-02-09 DIAGNOSIS — J432 Centrilobular emphysema: Secondary | ICD-10-CM | POA: Diagnosis not present

## 2023-02-09 DIAGNOSIS — J449 Chronic obstructive pulmonary disease, unspecified: Secondary | ICD-10-CM | POA: Diagnosis not present

## 2023-02-09 DIAGNOSIS — Z8673 Personal history of transient ischemic attack (TIA), and cerebral infarction without residual deficits: Secondary | ICD-10-CM | POA: Diagnosis not present

## 2023-02-09 DIAGNOSIS — Z951 Presence of aortocoronary bypass graft: Secondary | ICD-10-CM | POA: Diagnosis not present

## 2023-02-09 DIAGNOSIS — E785 Hyperlipidemia, unspecified: Secondary | ICD-10-CM | POA: Diagnosis not present

## 2023-02-09 DIAGNOSIS — I10 Essential (primary) hypertension: Secondary | ICD-10-CM | POA: Diagnosis not present

## 2023-02-09 DIAGNOSIS — I200 Unstable angina: Secondary | ICD-10-CM | POA: Diagnosis not present

## 2023-02-09 DIAGNOSIS — E1122 Type 2 diabetes mellitus with diabetic chronic kidney disease: Secondary | ICD-10-CM | POA: Diagnosis not present

## 2023-02-09 DIAGNOSIS — E1142 Type 2 diabetes mellitus with diabetic polyneuropathy: Secondary | ICD-10-CM | POA: Diagnosis not present

## 2023-02-09 DIAGNOSIS — I6529 Occlusion and stenosis of unspecified carotid artery: Secondary | ICD-10-CM | POA: Diagnosis not present

## 2023-02-09 DIAGNOSIS — E1159 Type 2 diabetes mellitus with other circulatory complications: Secondary | ICD-10-CM | POA: Diagnosis not present

## 2023-02-09 DIAGNOSIS — M6281 Muscle weakness (generalized): Secondary | ICD-10-CM | POA: Diagnosis not present

## 2023-02-09 DIAGNOSIS — G459 Transient cerebral ischemic attack, unspecified: Secondary | ICD-10-CM | POA: Diagnosis not present

## 2023-02-09 DIAGNOSIS — Z7902 Long term (current) use of antithrombotics/antiplatelets: Secondary | ICD-10-CM | POA: Diagnosis not present

## 2023-02-09 DIAGNOSIS — Z794 Long term (current) use of insulin: Secondary | ICD-10-CM | POA: Diagnosis not present

## 2023-02-09 DIAGNOSIS — I252 Old myocardial infarction: Secondary | ICD-10-CM | POA: Diagnosis not present

## 2023-02-09 DIAGNOSIS — R739 Hyperglycemia, unspecified: Secondary | ICD-10-CM | POA: Diagnosis not present

## 2023-02-09 DIAGNOSIS — E119 Type 2 diabetes mellitus without complications: Secondary | ICD-10-CM | POA: Diagnosis not present

## 2023-02-09 DIAGNOSIS — Z48812 Encounter for surgical aftercare following surgery on the circulatory system: Secondary | ICD-10-CM | POA: Diagnosis not present

## 2023-02-09 DIAGNOSIS — K5900 Constipation, unspecified: Secondary | ICD-10-CM | POA: Diagnosis not present

## 2023-02-09 DIAGNOSIS — G4700 Insomnia, unspecified: Secondary | ICD-10-CM | POA: Diagnosis not present

## 2023-02-09 DIAGNOSIS — R2681 Unsteadiness on feet: Secondary | ICD-10-CM | POA: Diagnosis not present

## 2023-02-09 DIAGNOSIS — R1319 Other dysphagia: Secondary | ICD-10-CM | POA: Diagnosis not present

## 2023-02-09 DIAGNOSIS — I2111 ST elevation (STEMI) myocardial infarction involving right coronary artery: Secondary | ICD-10-CM | POA: Diagnosis not present

## 2023-02-09 DIAGNOSIS — Z7984 Long term (current) use of oral hypoglycemic drugs: Secondary | ICD-10-CM | POA: Diagnosis not present

## 2023-02-09 LAB — BASIC METABOLIC PANEL
Anion gap: 8 (ref 5–15)
BUN: 25 mg/dL — ABNORMAL HIGH (ref 8–23)
CO2: 24 mmol/L (ref 22–32)
Calcium: 8.8 mg/dL — ABNORMAL LOW (ref 8.9–10.3)
Chloride: 105 mmol/L (ref 98–111)
Creatinine, Ser: 1.74 mg/dL — ABNORMAL HIGH (ref 0.61–1.24)
GFR, Estimated: 40 mL/min — ABNORMAL LOW (ref >60)
Glucose, Bld: 177 mg/dL — ABNORMAL HIGH (ref 70–99)
Potassium: 3.9 mmol/L (ref 3.5–5.1)
Sodium: 137 mmol/L (ref 135–145)

## 2023-02-09 LAB — GLUCOSE, CAPILLARY
Glucose-Capillary: 167 mg/dL — ABNORMAL HIGH (ref 70–99)
Glucose-Capillary: 170 mg/dL — ABNORMAL HIGH (ref 70–99)

## 2023-02-09 MED ORDER — AMIODARONE HCL 200 MG PO TABS
200.0000 mg | ORAL_TABLET | Freq: Every day | ORAL | Status: DC
  Administered 2023-02-09: 200 mg via ORAL
  Filled 2023-02-09: qty 1

## 2023-02-09 MED ORDER — TRAZODONE HCL 50 MG PO TABS: 50.0000 mg | ORAL_TABLET | Freq: Every evening | ORAL | Status: DC | PRN

## 2023-02-09 MED ORDER — ASPIRIN 81 MG PO TBEC: 81.0000 mg | DELAYED_RELEASE_TABLET | Freq: Every day | ORAL | Status: AC

## 2023-02-09 MED ORDER — AMIODARONE HCL 200 MG PO TABS: 200.0000 mg | ORAL_TABLET | Freq: Every day | ORAL | Status: DC

## 2023-02-09 NOTE — Progress Notes (Addendum)
Mobility Specialist Progress Note:   02/09/23 1050  Mobility  Activity Ambulated with assistance in hallway  Level of Assistance Minimal assist, patient does 75% or more  Assistive Device Front wheel walker  Distance Ambulated (ft) 95 ft  RUE Weight Bearing NWB  LUE Weight Bearing NWB  Activity Response Tolerated well  Mobility Referral Yes  $Mobility charge 1 Mobility  Mobility Specialist Start Time (ACUTE ONLY) 1015  Mobility Specialist Stop Time (ACUTE ONLY) 1040  Mobility Specialist Time Calculation (min) (ACUTE ONLY) 25 min   Pre Mobility: 65 HR  During Mobility: 67 HR   Pt receive in bed, agreeable to mobility. 2x seated rest break required d/t DOE. Pursed lip breathing encouraged. Pt c/o feeling slightly lightheaded but denied any dizziness. Pt returned to bed asymptomatic with call bell in reach and all needs met. Bed alarm on.   Leory Plowman  Mobility Specialist Please contact via SecureChat Rehab office at [phone]

## 2023-02-09 NOTE — Progress Notes (Signed)
      [address]       [place] [address]             [phone]      10 Days Post-Op Procedure(s) (LRB): CORONARY ARTERY BYPASS GRAFTING TIMES THREE USING LEFT INTERNAL MAMMARY ARTERY AND ENDOHARVEST OF RIGHT LEG GREATER SAPHENOUS VEIN (N/A) TRANSESOPHAGEAL ECHOCARDIOGRAM (N/A)  Subjective:  Patient without new complaints.  Said he had some episodes of delirium overnight.  I had a long conversation with patient about recovery expectations.  He has moved his bowels.  Objective: Vital signs in last 24 hours: Temp:  [97.6 F (36.4 C)-98.6 F (37 C)] 98.6 F (37 C) (09/30 0454) Pulse Rate:  [56-65] 56 (09/30 0500) Cardiac Rhythm: Normal sinus rhythm (09/29 2045) Resp:  [14-20] 19 (09/30 0500) BP: (91-127)/(56-110) 91/58 (09/30 0500) SpO2:  [94 %-100 %] 99 % (09/30 0500) Weight:  [89.7 kg] 89.7 kg (09/30 0500)  Intake/Output from previous day: 09/29 0701 - 09/30 0700 In: 360 [P.O.:360] Out: 700 [Urine:700]  General appearance: alert, cooperative, and no distress Heart: regular rate and rhythm Lungs: clear to auscultation bilaterally Abdomen: soft, non-tender; bowel sounds normal; no masses,  no organomegaly Extremities: edema trace Wound: clean and dry  Lab Results: No results for input(s): "WBC", "HGB", "HCT", "PLT" in the last 72 hours. BMET:  Recent Labs    02/09/23 0404  NA 137  K 3.9  CL 105  CO2 24  GLUCOSE 177*  BUN 25*  CREATININE 1.74*  CALCIUM 8.8*    PT/INR: No results for input(s): "LABPROT", "INR" in the last 72 hours. ABG    Component Value Date/Time   PHART 7.331 (L) 01/30/2023 2156   HCO3 16.9 (L) 01/30/2023 2156   TCO2 18 (L) 01/30/2023 2156   ACIDBASEDEF 8.0 (H) 01/30/2023 2156   O2SAT 99 01/30/2023 2156   CBG (last 3)  Recent Labs    02/08/23 1651 02/08/23 2121 02/09/23 0605  GLUCAP 120* 144* 167*    Assessment/Plan: S/P Procedure(s) (LRB): CORONARY ARTERY BYPASS GRAFTING TIMES THREE USING LEFT INTERNAL  MAMMARY ARTERY AND ENDOHARVEST OF RIGHT LEG GREATER SAPHENOUS VEIN (N/A) TRANSESOPHAGEAL ECHOCARDIOGRAM (N/A)  CV- Sinus Bradycardia-continue Lopressor, will decrease Amiodarone to 200 mg daily as discussed with Dr. Darl Pikes- no acute issues, off oxygen, continue IS Renal- creatinine at 1.7, baseline range is 1.3-1.7.[name] not currently on diuretics DM- sugars stable Deconditioning- severe, for SNF placement Dispo- patient stable, constipation resolved, for d/c to SNF today   LOS: 15 days    Lowella Dandy, PA-C 02/09/2023

## 2023-02-09 NOTE — Progress Notes (Signed)
Discussed with pt IS, sternal precautions, mobility as able, CRPII. Left DM diet. Will refer to [facility] CRPII. Some appropriate questions. Pt has [facility] book.  [id number] Ethelda Chick BS, ACSM-CEP 02/09/2023 11:26 AM

## 2023-02-09 NOTE — Progress Notes (Signed)
PT Cancellation Note  Patient Details Name: Jon [name] MRN: [id number] DOB: 21-Dec-1945   Cancelled Treatment:    Reason Eval/Treat Not Completed: (P) Patient declined, no reason specified (Pt reports mobility earlier today and politely declines mobility at this time. Will continue to follow per PT POC.)   [name] 02/09/2023, 2:47 PM

## 2023-02-09 NOTE — TOC Transition Note (Signed)
Transition of Care [facility]) - CM/SW Discharge Note   Patient Details  Name: Jon [name] MRN: [id number] Date of Birth: 1945/12/21  Transition of Care [facility]) CM/SW Contact:  Eduard Roux, LCSW Phone Number: 02/09/2023, 2:53 PM   Clinical Narrative:     Patient will Discharge to: [facility] Discharge Date: 02/09/2023 Family Notified: daughter Transport AO:ZHYQ  Per MD patient is ready for discharge. RN, patient, and facility notified of discharge. Discharge Summary sent to facility. RN given number for [phone]. Ambulance transport requested for patient.   Clinical Social Worker signing off.  Antony Blackbird, MSW, LCSW Clinical Social Worker      Final next level of care: Skilled Nursing Facility Barriers to Discharge: Barriers Resolved   Patient Goals and CMS Choice CMS Medicare.gov Compare Post Acute Care list provided to:: Patient Choice offered to / list presented to : Patient  Discharge Placement                Patient chooses bed at:  [facility]) Patient to be transferred to facility by: [facility] Name of family member notified: daughter Patient and family notified of of transfer: 02/09/23  Discharge Plan and Services Additional resources added to the After Visit Summary for   In-house Referral: Clinical Social Work Discharge Planning Services: CM Consult Post Acute Care Choice: NA          DME Arranged: N/A DME Agency: NA       HH Arranged: NA          Social Determinants of Health (SDOH) Interventions SDOH Screenings   Food Insecurity: No Food Insecurity (01/25/2023)  Housing: Low Risk  (01/25/2023)  Transportation Needs: No Transportation Needs (01/25/2023)  Utilities: Not At Risk (01/25/2023)  Alcohol Screen: Low Risk  (08/01/2022)  Depression (PHQ2-9): Low Risk  (12/04/2022)  Financial Resource Strain: Low Risk  (08/01/2022)  Physical Activity: Inactive (08/01/2022)  Social Connections: Unknown  (01/23/2023)   Received from [facility]  Stress: No Stress Concern Present (01/23/2023)   Received from [facility]  Tobacco Use: Medium Risk (01/30/2023)     Readmission Risk Interventions     No data to display

## 2023-02-10 DIAGNOSIS — Z8673 Personal history of transient ischemic attack (TIA), and cerebral infarction without residual deficits: Secondary | ICD-10-CM | POA: Diagnosis not present

## 2023-02-10 DIAGNOSIS — I6529 Occlusion and stenosis of unspecified carotid artery: Secondary | ICD-10-CM | POA: Diagnosis not present

## 2023-02-10 DIAGNOSIS — E1142 Type 2 diabetes mellitus with diabetic polyneuropathy: Secondary | ICD-10-CM | POA: Diagnosis not present

## 2023-02-10 DIAGNOSIS — I252 Old myocardial infarction: Secondary | ICD-10-CM | POA: Diagnosis not present

## 2023-02-10 DIAGNOSIS — I25110 Atherosclerotic heart disease of native coronary artery with unstable angina pectoris: Secondary | ICD-10-CM | POA: Diagnosis not present

## 2023-02-10 DIAGNOSIS — J449 Chronic obstructive pulmonary disease, unspecified: Secondary | ICD-10-CM | POA: Diagnosis not present

## 2023-02-10 DIAGNOSIS — N183 Chronic kidney disease, stage 3 unspecified: Secondary | ICD-10-CM | POA: Diagnosis not present

## 2023-02-10 DIAGNOSIS — E1159 Type 2 diabetes mellitus with other circulatory complications: Secondary | ICD-10-CM | POA: Diagnosis not present

## 2023-02-10 DIAGNOSIS — Z794 Long term (current) use of insulin: Secondary | ICD-10-CM | POA: Diagnosis not present

## 2023-02-10 DIAGNOSIS — E1122 Type 2 diabetes mellitus with diabetic chronic kidney disease: Secondary | ICD-10-CM | POA: Diagnosis not present

## 2023-02-10 DIAGNOSIS — I129 Hypertensive chronic kidney disease with stage 1 through stage 4 chronic kidney disease, or unspecified chronic kidney disease: Secondary | ICD-10-CM | POA: Diagnosis not present

## 2023-02-10 DIAGNOSIS — Z7984 Long term (current) use of oral hypoglycemic drugs: Secondary | ICD-10-CM | POA: Diagnosis not present

## 2023-02-10 DIAGNOSIS — Z951 Presence of aortocoronary bypass graft: Secondary | ICD-10-CM | POA: Diagnosis not present

## 2023-02-10 DIAGNOSIS — K5900 Constipation, unspecified: Secondary | ICD-10-CM | POA: Diagnosis not present

## 2023-02-10 DIAGNOSIS — G4700 Insomnia, unspecified: Secondary | ICD-10-CM | POA: Diagnosis not present

## 2023-02-10 DIAGNOSIS — R5381 Other malaise: Secondary | ICD-10-CM | POA: Diagnosis not present

## 2023-02-10 DIAGNOSIS — Z7982 Long term (current) use of aspirin: Secondary | ICD-10-CM | POA: Diagnosis not present

## 2023-02-10 DIAGNOSIS — J432 Centrilobular emphysema: Secondary | ICD-10-CM | POA: Diagnosis not present

## 2023-02-10 DIAGNOSIS — Z7902 Long term (current) use of antithrombotics/antiplatelets: Secondary | ICD-10-CM | POA: Diagnosis not present

## 2023-02-10 DIAGNOSIS — E785 Hyperlipidemia, unspecified: Secondary | ICD-10-CM | POA: Diagnosis not present

## 2023-02-10 NOTE — Consult Note (Signed)
[facility] Inpatient Consult   02/10/2023  Jon [name] [birth year] [id number]  Follow up:  Disposition -  SNF rehab  [facility] [[facility]]  Accountable Care Organization [ACO] Patient: [name] Medicare  Primary Care Provider:  Agapito Games, MD, [place]  [facility]  Patient was screened for hospitalization and on behalf of [facility] Care Coordination to assess for post hospital community care needs.  Patient transitioned last evening to a skilled nursing facility level of care for post hospital transition of [facility] affiliated facility with traditional Medicare and approved Medicare Advantage plans. Patient to [facility] noted.  Plan:   Will notify the Community [facility] RN can follow for any known or needs for transitional care needs for returning to post facility care coordination needs to return to community.  For questions or referrals, please contact:   Charlesetta Shanks, RN, BSN, CCM [place]  [facility], [facility] Liaison Direct Dial: [phone] or secure chat Website: [email or web address]

## 2023-02-11 ENCOUNTER — Encounter: Payer: Medicare Other | Admitting: Physical Therapy

## 2023-02-16 ENCOUNTER — Other Ambulatory Visit: Payer: Self-pay | Admitting: Family Medicine

## 2023-02-17 DIAGNOSIS — I25110 Atherosclerotic heart disease of native coronary artery with unstable angina pectoris: Secondary | ICD-10-CM | POA: Diagnosis not present

## 2023-02-17 DIAGNOSIS — I4891 Unspecified atrial fibrillation: Secondary | ICD-10-CM | POA: Diagnosis not present

## 2023-02-17 DIAGNOSIS — N183 Chronic kidney disease, stage 3 unspecified: Secondary | ICD-10-CM | POA: Diagnosis not present

## 2023-02-17 DIAGNOSIS — J432 Centrilobular emphysema: Secondary | ICD-10-CM | POA: Diagnosis not present

## 2023-02-17 DIAGNOSIS — I129 Hypertensive chronic kidney disease with stage 1 through stage 4 chronic kidney disease, or unspecified chronic kidney disease: Secondary | ICD-10-CM | POA: Diagnosis not present

## 2023-02-17 DIAGNOSIS — E1159 Type 2 diabetes mellitus with other circulatory complications: Secondary | ICD-10-CM | POA: Diagnosis not present

## 2023-02-19 DIAGNOSIS — Z8673 Personal history of transient ischemic attack (TIA), and cerebral infarction without residual deficits: Secondary | ICD-10-CM | POA: Diagnosis not present

## 2023-02-19 DIAGNOSIS — R42 Dizziness and giddiness: Secondary | ICD-10-CM | POA: Diagnosis not present

## 2023-02-20 ENCOUNTER — Ambulatory Visit: Payer: Medicare Other | Admitting: Nurse Practitioner

## 2023-02-24 ENCOUNTER — Institutional Professional Consult (permissible substitution): Payer: Medicare Other | Admitting: Student in an Organized Health Care Education/Training Program

## 2023-02-25 ENCOUNTER — Telehealth: Payer: Self-pay | Admitting: Family Medicine

## 2023-02-25 ENCOUNTER — Other Ambulatory Visit: Payer: Self-pay | Admitting: *Deleted

## 2023-02-25 DIAGNOSIS — E1122 Type 2 diabetes mellitus with diabetic chronic kidney disease: Secondary | ICD-10-CM | POA: Diagnosis not present

## 2023-02-25 DIAGNOSIS — Z95820 Peripheral vascular angioplasty status with implants and grafts: Secondary | ICD-10-CM | POA: Diagnosis not present

## 2023-02-25 DIAGNOSIS — I129 Hypertensive chronic kidney disease with stage 1 through stage 4 chronic kidney disease, or unspecified chronic kidney disease: Secondary | ICD-10-CM | POA: Diagnosis not present

## 2023-02-25 DIAGNOSIS — H9191 Unspecified hearing loss, right ear: Secondary | ICD-10-CM | POA: Diagnosis not present

## 2023-02-25 DIAGNOSIS — G4700 Insomnia, unspecified: Secondary | ICD-10-CM | POA: Diagnosis not present

## 2023-02-25 DIAGNOSIS — Z7984 Long term (current) use of oral hypoglycemic drugs: Secondary | ICD-10-CM | POA: Diagnosis not present

## 2023-02-25 DIAGNOSIS — I2510 Atherosclerotic heart disease of native coronary artery without angina pectoris: Secondary | ICD-10-CM | POA: Diagnosis not present

## 2023-02-25 DIAGNOSIS — E1159 Type 2 diabetes mellitus with other circulatory complications: Secondary | ICD-10-CM | POA: Diagnosis not present

## 2023-02-25 DIAGNOSIS — M75101 Unspecified rotator cuff tear or rupture of right shoulder, not specified as traumatic: Secondary | ICD-10-CM | POA: Diagnosis not present

## 2023-02-25 DIAGNOSIS — I252 Old myocardial infarction: Secondary | ICD-10-CM | POA: Diagnosis not present

## 2023-02-25 DIAGNOSIS — E1142 Type 2 diabetes mellitus with diabetic polyneuropathy: Secondary | ICD-10-CM | POA: Diagnosis not present

## 2023-02-25 DIAGNOSIS — N1831 Chronic kidney disease, stage 3a: Secondary | ICD-10-CM | POA: Diagnosis not present

## 2023-02-25 DIAGNOSIS — Z48812 Encounter for surgical aftercare following surgery on the circulatory system: Secondary | ICD-10-CM | POA: Diagnosis not present

## 2023-02-25 DIAGNOSIS — J432 Centrilobular emphysema: Secondary | ICD-10-CM | POA: Diagnosis not present

## 2023-02-25 DIAGNOSIS — Z7982 Long term (current) use of aspirin: Secondary | ICD-10-CM | POA: Diagnosis not present

## 2023-02-25 DIAGNOSIS — S5012XD Contusion of left forearm, subsequent encounter: Secondary | ICD-10-CM | POA: Diagnosis not present

## 2023-02-25 DIAGNOSIS — Z955 Presence of coronary angioplasty implant and graft: Secondary | ICD-10-CM | POA: Diagnosis not present

## 2023-02-25 DIAGNOSIS — Z951 Presence of aortocoronary bypass graft: Secondary | ICD-10-CM | POA: Diagnosis not present

## 2023-02-25 DIAGNOSIS — K5900 Constipation, unspecified: Secondary | ICD-10-CM | POA: Diagnosis not present

## 2023-02-25 DIAGNOSIS — E785 Hyperlipidemia, unspecified: Secondary | ICD-10-CM | POA: Diagnosis not present

## 2023-02-25 DIAGNOSIS — Z7902 Long term (current) use of antithrombotics/antiplatelets: Secondary | ICD-10-CM | POA: Diagnosis not present

## 2023-02-25 DIAGNOSIS — I4891 Unspecified atrial fibrillation: Secondary | ICD-10-CM | POA: Diagnosis not present

## 2023-02-25 MED ORDER — SYNJARDY 12.5-500 MG PO TABS: 1.0000 | ORAL_TABLET | Freq: Two times a day (BID) | ORAL | 0 refills | Status: DC

## 2023-02-25 NOTE — Telephone Encounter (Signed)
I didn't order home health.  Did his Cardiologist?

## 2023-02-25 NOTE — Telephone Encounter (Signed)
OK, sounds good. Thank you!

## 2023-02-25 NOTE — Telephone Encounter (Signed)
[facility] called and requested Home Health for In home PT 1 time a week for 7 weeks Please call [phone]

## 2023-02-25 NOTE — Telephone Encounter (Signed)
Spoke with PT with [facility] - states patient was just released from nursing home after hospital stay for bypass surgery. Was in [facility]ab 22 days.  He was evaluated today for PT and OT will also soon be calling.  PT has advised  patient that needs to contact our office to schedule a hospital follow up visit with provider.

## 2023-02-26 ENCOUNTER — Other Ambulatory Visit: Payer: Self-pay | Admitting: Family Medicine

## 2023-03-02 ENCOUNTER — Telehealth: Payer: Self-pay | Admitting: Family Medicine

## 2023-03-02 DIAGNOSIS — E1122 Type 2 diabetes mellitus with diabetic chronic kidney disease: Secondary | ICD-10-CM | POA: Diagnosis not present

## 2023-03-02 DIAGNOSIS — M75101 Unspecified rotator cuff tear or rupture of right shoulder, not specified as traumatic: Secondary | ICD-10-CM | POA: Diagnosis not present

## 2023-03-02 DIAGNOSIS — E785 Hyperlipidemia, unspecified: Secondary | ICD-10-CM | POA: Diagnosis not present

## 2023-03-02 DIAGNOSIS — K5900 Constipation, unspecified: Secondary | ICD-10-CM | POA: Diagnosis not present

## 2023-03-02 DIAGNOSIS — I4891 Unspecified atrial fibrillation: Secondary | ICD-10-CM | POA: Diagnosis not present

## 2023-03-02 DIAGNOSIS — I129 Hypertensive chronic kidney disease with stage 1 through stage 4 chronic kidney disease, or unspecified chronic kidney disease: Secondary | ICD-10-CM | POA: Diagnosis not present

## 2023-03-02 DIAGNOSIS — Z95820 Peripheral vascular angioplasty status with implants and grafts: Secondary | ICD-10-CM | POA: Diagnosis not present

## 2023-03-02 DIAGNOSIS — Z7902 Long term (current) use of antithrombotics/antiplatelets: Secondary | ICD-10-CM | POA: Diagnosis not present

## 2023-03-02 DIAGNOSIS — Z955 Presence of coronary angioplasty implant and graft: Secondary | ICD-10-CM | POA: Diagnosis not present

## 2023-03-02 DIAGNOSIS — Z48812 Encounter for surgical aftercare following surgery on the circulatory system: Secondary | ICD-10-CM | POA: Diagnosis not present

## 2023-03-02 DIAGNOSIS — G4700 Insomnia, unspecified: Secondary | ICD-10-CM | POA: Diagnosis not present

## 2023-03-02 DIAGNOSIS — I2510 Atherosclerotic heart disease of native coronary artery without angina pectoris: Secondary | ICD-10-CM | POA: Diagnosis not present

## 2023-03-02 DIAGNOSIS — E1159 Type 2 diabetes mellitus with other circulatory complications: Secondary | ICD-10-CM | POA: Diagnosis not present

## 2023-03-02 DIAGNOSIS — Z7982 Long term (current) use of aspirin: Secondary | ICD-10-CM | POA: Diagnosis not present

## 2023-03-02 DIAGNOSIS — I252 Old myocardial infarction: Secondary | ICD-10-CM | POA: Diagnosis not present

## 2023-03-02 DIAGNOSIS — E1142 Type 2 diabetes mellitus with diabetic polyneuropathy: Secondary | ICD-10-CM | POA: Diagnosis not present

## 2023-03-02 DIAGNOSIS — S5012XD Contusion of left forearm, subsequent encounter: Secondary | ICD-10-CM | POA: Diagnosis not present

## 2023-03-02 DIAGNOSIS — Z951 Presence of aortocoronary bypass graft: Secondary | ICD-10-CM | POA: Diagnosis not present

## 2023-03-02 DIAGNOSIS — N1831 Chronic kidney disease, stage 3a: Secondary | ICD-10-CM | POA: Diagnosis not present

## 2023-03-02 DIAGNOSIS — H9191 Unspecified hearing loss, right ear: Secondary | ICD-10-CM | POA: Diagnosis not present

## 2023-03-02 DIAGNOSIS — Z7984 Long term (current) use of oral hypoglycemic drugs: Secondary | ICD-10-CM | POA: Diagnosis not present

## 2023-03-02 DIAGNOSIS — J432 Centrilobular emphysema: Secondary | ICD-10-CM | POA: Diagnosis not present

## 2023-03-02 NOTE — Telephone Encounter (Signed)
[facility] called and wanted to report that patient had a recent fall on 03-01-23 and also request verbal order for OT 1 time week x 6 weeks  Please contact [phone]

## 2023-03-02 NOTE — Telephone Encounter (Signed)
OK to give verbal order 

## 2023-03-03 ENCOUNTER — Other Ambulatory Visit: Payer: Self-pay | Admitting: Surgery

## 2023-03-03 DIAGNOSIS — Z951 Presence of aortocoronary bypass graft: Secondary | ICD-10-CM

## 2023-03-03 NOTE — Telephone Encounter (Signed)
Left message giving verbal orders.  

## 2023-03-04 ENCOUNTER — Ambulatory Visit (INDEPENDENT_AMBULATORY_CARE_PROVIDER_SITE_OTHER): Payer: Self-pay | Admitting: Surgery

## 2023-03-04 ENCOUNTER — Other Ambulatory Visit: Payer: Self-pay | Admitting: *Deleted

## 2023-03-04 ENCOUNTER — Ambulatory Visit
Admission: RE | Admit: 2023-03-04 | Discharge: 2023-03-04 | Disposition: A | Discharge status: Home / Self Care | Payer: Medicare Other | Source: Ambulatory Visit | Attending: Surgery | Admitting: Surgery

## 2023-03-04 ENCOUNTER — Encounter: Payer: Self-pay | Admitting: Surgery

## 2023-03-04 VITALS — BP 104/62 | HR 72 | Resp 18 | Ht 73.0 in | Wt 208.0 lb

## 2023-03-04 DIAGNOSIS — Z951 Presence of aortocoronary bypass graft: Secondary | ICD-10-CM | POA: Diagnosis not present

## 2023-03-04 NOTE — Progress Notes (Signed)
HPI: Patient returns for routine postoperative follow-up having undergone coronary bypass graft surgery x 3 on 01/30/2023. The patient's early postoperative recovery while in the hospital was notable for development of postoperative atrial fibrillation converted with amiodarone.  He also developed postoperative delirium which was worse at night and seemed to improve during the day.  He continued to improve and was discharged to a skilled nursing facility for 21 days.  He is now back at home. He said that earlier this week he was taking 2 bags of trash out to his trash cans and suddenly fell forward landing on his face.  He skinned up his knees and cut his lip and has a few bruises.  He said that his chest was very sore for couple days but has improved.  He was told to use a walker whenever he was up walking and not to go outside by himself when he left the skilled nursing facility.  He denies any chest pain or shortness of breath.  His daughter is with him today.  She said that he spends most of his time at home sitting on the couch and has not been walking much.  He has not gotten in the shower and has been sponge bathing due to his concern about falling.  Current Outpatient Medications  Medication Sig Dispense Refill   Accu-Chek Softclix Lancets lancets Use to check blood sugars twice daily 300 each 3   albuterol (VENTOLIN HFA) 108 (90 Base) MCG/ACT inhaler Inhale 2 puffs into the lungs every 6 (six) hours as needed for wheezing or shortness of breath. 8 g 0   amiodarone (PACERONE) 200 MG tablet Take 1 tablet (200 mg total) by mouth daily.     aspirin EC 81 MG tablet Take 1 tablet (81 mg total) by mouth daily. Swallow whole.     atorvastatin (LIPITOR) 80 MG tablet TAKE 1 TABLET BY MOUTH AT  BEDTIME 100 tablet 2   Blood Glucose Monitoring Suppl (ACCU-CHEK GUIDE ME) w/Device KIT Check glucose three times daily. Dx. Code E11.8 1 kit 3   clopidogrel (PLAVIX) 75 MG tablet Take 1 tablet (75 mg total)  by mouth daily. 90 tablet 3   Empagliflozin-metFORMIN HCl (SYNJARDY) 12.5-500 MG TABS Take 1 tablet by mouth 2 (two) times daily. 180 tablet 0   glucose blood (ACCU-CHEK GUIDE) test strip Dx DM E11.9. Check fasting blood sugar every morning. 300 each PRN   insulin degludec (TRESIBA FLEXTOUCH) 100 UNIT/ML FlexTouch Pen Inject 20 Units into the skin daily. (Patient taking differently: Inject 20 Units into the skin daily as needed (high blood sugar per patient).) 1 mL 0   lisinopril (ZESTRIL) 2.5 MG tablet Take 2.5 mg by mouth daily.     memantine (NAMENDA TITRATION PACK) tablet pack Take by mouth See admin instructions. 5 mg/day for =1 week; 5 mg twice daily for =1 week; 15 mg/day given in 5 mg and 10 mg separated doses for =1 week; then 10 mg twice daily     metoprolol succinate (TOPROL-XL) 25 MG 24 hr tablet Take 1 tablet (25 mg total) by mouth daily. 90 tablet 3   Semaglutide,0.25 or 0.5MG /DOS, 2 MG/3ML SOPN Inject 0.5 mg into the skin every 7 (seven) days.     traZODone (DESYREL) 50 MG tablet Take 1 tablet (50 mg total) by mouth at bedtime as needed for sleep.     No current facility-administered medications for this visit.    Physical Exam: BP 104/62 (BP Location: Left Arm, Patient Position:  Sitting)   Pulse 72   Resp 18   Ht 6\' 1"  (1.854 m)   Wt 208 lb (94.3 kg)   SpO2 97% Comment: RA  BMI 27.44 kg/m  He looks well. Cardiac exam shows a regular rate and rhythm with normal heart sounds.  There is no murmur. Lungs are clear. The chest incision is healing well and the sternum is stable. His leg incision is healing well and there is no peripheral edema.  Diagnostic Tests:  Chest x-ray today shows clear lung fields and no pleural effusions.  Impression:  He is doing fairly well 1 month following his surgery.  I think he needs to keep ambulating and I encouraged him to use his walker whenever he is up and ambulating.  I do not think he should be outside ambulating unless he has  someone with him.  I told him that he is not ready to return to driving a car.  I think he would benefit from outpatient cardiac rehab.  He asked me about a medication that he was started on at the SNF called Namenda.  It looks like this was started by the supervising physician there due to some concerns about dementia.  He has somewhat of a quirky personality but I do not think he has dementia and seems like he is back at his baseline.  I told him he should discontinue this medicine so it does not cloud the picture.  Plan:  He will be referred to outpatient cardiac rehab.  I will plan to see him back in 1 month for follow-up and we will plan to discontinue the amiodarone at that time if he is maintaining sinus rhythm.   [name], MD [facility] [phone]

## 2023-03-04 NOTE — Progress Notes (Signed)
Per Dr. Laneta Simmers, referral for cardiac rehab placed to [facility].

## 2023-03-05 ENCOUNTER — Telehealth: Payer: Self-pay | Admitting: Family Medicine

## 2023-03-05 DIAGNOSIS — S5012XD Contusion of left forearm, subsequent encounter: Secondary | ICD-10-CM | POA: Diagnosis not present

## 2023-03-05 DIAGNOSIS — I252 Old myocardial infarction: Secondary | ICD-10-CM | POA: Diagnosis not present

## 2023-03-05 DIAGNOSIS — Z7985 Long-term (current) use of injectable non-insulin antidiabetic drugs: Secondary | ICD-10-CM | POA: Diagnosis not present

## 2023-03-05 DIAGNOSIS — Z7984 Long term (current) use of oral hypoglycemic drugs: Secondary | ICD-10-CM | POA: Diagnosis not present

## 2023-03-05 DIAGNOSIS — I4891 Unspecified atrial fibrillation: Secondary | ICD-10-CM | POA: Diagnosis not present

## 2023-03-05 DIAGNOSIS — E1142 Type 2 diabetes mellitus with diabetic polyneuropathy: Secondary | ICD-10-CM | POA: Diagnosis not present

## 2023-03-05 DIAGNOSIS — I129 Hypertensive chronic kidney disease with stage 1 through stage 4 chronic kidney disease, or unspecified chronic kidney disease: Secondary | ICD-10-CM | POA: Diagnosis not present

## 2023-03-05 DIAGNOSIS — H9191 Unspecified hearing loss, right ear: Secondary | ICD-10-CM | POA: Diagnosis not present

## 2023-03-05 DIAGNOSIS — M75101 Unspecified rotator cuff tear or rupture of right shoulder, not specified as traumatic: Secondary | ICD-10-CM | POA: Diagnosis not present

## 2023-03-05 DIAGNOSIS — E1122 Type 2 diabetes mellitus with diabetic chronic kidney disease: Secondary | ICD-10-CM | POA: Diagnosis not present

## 2023-03-05 DIAGNOSIS — Z955 Presence of coronary angioplasty implant and graft: Secondary | ICD-10-CM | POA: Diagnosis not present

## 2023-03-05 DIAGNOSIS — Z951 Presence of aortocoronary bypass graft: Secondary | ICD-10-CM | POA: Diagnosis not present

## 2023-03-05 DIAGNOSIS — E785 Hyperlipidemia, unspecified: Secondary | ICD-10-CM | POA: Diagnosis not present

## 2023-03-05 DIAGNOSIS — I2510 Atherosclerotic heart disease of native coronary artery without angina pectoris: Secondary | ICD-10-CM | POA: Diagnosis not present

## 2023-03-05 DIAGNOSIS — Z95820 Peripheral vascular angioplasty status with implants and grafts: Secondary | ICD-10-CM | POA: Diagnosis not present

## 2023-03-05 DIAGNOSIS — E1159 Type 2 diabetes mellitus with other circulatory complications: Secondary | ICD-10-CM | POA: Diagnosis not present

## 2023-03-05 DIAGNOSIS — Z7982 Long term (current) use of aspirin: Secondary | ICD-10-CM | POA: Diagnosis not present

## 2023-03-05 DIAGNOSIS — N1831 Chronic kidney disease, stage 3a: Secondary | ICD-10-CM | POA: Diagnosis not present

## 2023-03-05 DIAGNOSIS — K5900 Constipation, unspecified: Secondary | ICD-10-CM | POA: Diagnosis not present

## 2023-03-05 DIAGNOSIS — G4700 Insomnia, unspecified: Secondary | ICD-10-CM | POA: Diagnosis not present

## 2023-03-05 DIAGNOSIS — J432 Centrilobular emphysema: Secondary | ICD-10-CM | POA: Diagnosis not present

## 2023-03-05 DIAGNOSIS — Z48812 Encounter for surgical aftercare following surgery on the circulatory system: Secondary | ICD-10-CM | POA: Diagnosis not present

## 2023-03-05 DIAGNOSIS — Z7902 Long term (current) use of antithrombotics/antiplatelets: Secondary | ICD-10-CM | POA: Diagnosis not present

## 2023-03-05 MED ORDER — POLYETHYLENE GLYCOL 3350 17 GM/SCOOP PO POWD: 1.0000 | Freq: Every day | ORAL | 0 refills | Status: DC

## 2023-03-05 NOTE — Telephone Encounter (Signed)
Attempted call to Marlaine Hind PTA with [facility] [phone] Left a voice mail message requesting a return call.

## 2023-03-05 NOTE — Telephone Encounter (Signed)
Kendal Hymen called from [facility] home health she has some concerns patient fell outside apt on 03-02-23 He has some cuts and bruises didn't go the ER patient hasn't had a full bowel movement in over a week . Kendal Hymen has concerns about  metoprolol succinate XL 25mg  that is causing him to fall If you could call in something for constipation before his appointment   [facility] [place]  Phone [phone]

## 2023-03-05 NOTE — Telephone Encounter (Signed)
Ok recommend mrialax. It is OTC but I sent to pharmacy as well.  I think we needed to check his blood pressure before stopping or holding the metoprolol.  They will need to take it in both arms AC does have a big difference in pressures in both arms.  Also make sure he is hydrating well and no he recently got out of surgery.

## 2023-03-09 ENCOUNTER — Telehealth: Payer: Self-pay | Admitting: Family Medicine

## 2023-03-09 DIAGNOSIS — M75101 Unspecified rotator cuff tear or rupture of right shoulder, not specified as traumatic: Secondary | ICD-10-CM | POA: Diagnosis not present

## 2023-03-09 DIAGNOSIS — S5012XD Contusion of left forearm, subsequent encounter: Secondary | ICD-10-CM | POA: Diagnosis not present

## 2023-03-09 DIAGNOSIS — Z7984 Long term (current) use of oral hypoglycemic drugs: Secondary | ICD-10-CM | POA: Diagnosis not present

## 2023-03-09 DIAGNOSIS — H9191 Unspecified hearing loss, right ear: Secondary | ICD-10-CM | POA: Diagnosis not present

## 2023-03-09 DIAGNOSIS — E1142 Type 2 diabetes mellitus with diabetic polyneuropathy: Secondary | ICD-10-CM | POA: Diagnosis not present

## 2023-03-09 DIAGNOSIS — I252 Old myocardial infarction: Secondary | ICD-10-CM | POA: Diagnosis not present

## 2023-03-09 DIAGNOSIS — E1122 Type 2 diabetes mellitus with diabetic chronic kidney disease: Secondary | ICD-10-CM | POA: Diagnosis not present

## 2023-03-09 DIAGNOSIS — I4891 Unspecified atrial fibrillation: Secondary | ICD-10-CM | POA: Diagnosis not present

## 2023-03-09 DIAGNOSIS — I2510 Atherosclerotic heart disease of native coronary artery without angina pectoris: Secondary | ICD-10-CM | POA: Diagnosis not present

## 2023-03-09 DIAGNOSIS — G4700 Insomnia, unspecified: Secondary | ICD-10-CM | POA: Diagnosis not present

## 2023-03-09 DIAGNOSIS — K5900 Constipation, unspecified: Secondary | ICD-10-CM | POA: Diagnosis not present

## 2023-03-09 DIAGNOSIS — Z48812 Encounter for surgical aftercare following surgery on the circulatory system: Secondary | ICD-10-CM | POA: Diagnosis not present

## 2023-03-09 DIAGNOSIS — Z95820 Peripheral vascular angioplasty status with implants and grafts: Secondary | ICD-10-CM | POA: Diagnosis not present

## 2023-03-09 DIAGNOSIS — Z955 Presence of coronary angioplasty implant and graft: Secondary | ICD-10-CM | POA: Diagnosis not present

## 2023-03-09 DIAGNOSIS — E1159 Type 2 diabetes mellitus with other circulatory complications: Secondary | ICD-10-CM | POA: Diagnosis not present

## 2023-03-09 DIAGNOSIS — N1831 Chronic kidney disease, stage 3a: Secondary | ICD-10-CM | POA: Diagnosis not present

## 2023-03-09 DIAGNOSIS — E785 Hyperlipidemia, unspecified: Secondary | ICD-10-CM | POA: Diagnosis not present

## 2023-03-09 DIAGNOSIS — I129 Hypertensive chronic kidney disease with stage 1 through stage 4 chronic kidney disease, or unspecified chronic kidney disease: Secondary | ICD-10-CM | POA: Diagnosis not present

## 2023-03-09 DIAGNOSIS — J432 Centrilobular emphysema: Secondary | ICD-10-CM | POA: Diagnosis not present

## 2023-03-09 DIAGNOSIS — Z7902 Long term (current) use of antithrombotics/antiplatelets: Secondary | ICD-10-CM | POA: Diagnosis not present

## 2023-03-09 DIAGNOSIS — Z951 Presence of aortocoronary bypass graft: Secondary | ICD-10-CM | POA: Diagnosis not present

## 2023-03-09 DIAGNOSIS — Z7982 Long term (current) use of aspirin: Secondary | ICD-10-CM | POA: Diagnosis not present

## 2023-03-09 MED ORDER — AMIODARONE HCL 200 MG PO TABS: 200.0000 mg | ORAL_TABLET | Freq: Every day | ORAL | 1 refills | Status: DC

## 2023-03-09 NOTE — Telephone Encounter (Signed)
Med sent.

## 2023-03-09 NOTE — Telephone Encounter (Signed)
Patient request refill on Amiodarone HCL 200mg       [facility]) [place], [place] [address] [facility] [address] [place] [address], [place] [address] Phone: [phone]  Fax: [phone]

## 2023-03-09 NOTE — Telephone Encounter (Signed)
Patient called he is on new medication amiodarone HCL 200mg  by Dr. Lorenda Hatchet at [facility]

## 2023-03-10 DIAGNOSIS — G4700 Insomnia, unspecified: Secondary | ICD-10-CM | POA: Diagnosis not present

## 2023-03-10 DIAGNOSIS — Z7902 Long term (current) use of antithrombotics/antiplatelets: Secondary | ICD-10-CM | POA: Diagnosis not present

## 2023-03-10 DIAGNOSIS — Z955 Presence of coronary angioplasty implant and graft: Secondary | ICD-10-CM | POA: Diagnosis not present

## 2023-03-10 DIAGNOSIS — E1142 Type 2 diabetes mellitus with diabetic polyneuropathy: Secondary | ICD-10-CM | POA: Diagnosis not present

## 2023-03-10 DIAGNOSIS — Z95820 Peripheral vascular angioplasty status with implants and grafts: Secondary | ICD-10-CM | POA: Diagnosis not present

## 2023-03-10 DIAGNOSIS — Z7982 Long term (current) use of aspirin: Secondary | ICD-10-CM | POA: Diagnosis not present

## 2023-03-10 DIAGNOSIS — I2510 Atherosclerotic heart disease of native coronary artery without angina pectoris: Secondary | ICD-10-CM | POA: Diagnosis not present

## 2023-03-10 DIAGNOSIS — I252 Old myocardial infarction: Secondary | ICD-10-CM | POA: Diagnosis not present

## 2023-03-10 DIAGNOSIS — H9191 Unspecified hearing loss, right ear: Secondary | ICD-10-CM | POA: Diagnosis not present

## 2023-03-10 DIAGNOSIS — J432 Centrilobular emphysema: Secondary | ICD-10-CM | POA: Diagnosis not present

## 2023-03-10 DIAGNOSIS — E785 Hyperlipidemia, unspecified: Secondary | ICD-10-CM | POA: Diagnosis not present

## 2023-03-10 DIAGNOSIS — K5900 Constipation, unspecified: Secondary | ICD-10-CM | POA: Diagnosis not present

## 2023-03-10 DIAGNOSIS — E1159 Type 2 diabetes mellitus with other circulatory complications: Secondary | ICD-10-CM | POA: Diagnosis not present

## 2023-03-10 DIAGNOSIS — Z48812 Encounter for surgical aftercare following surgery on the circulatory system: Secondary | ICD-10-CM | POA: Diagnosis not present

## 2023-03-10 DIAGNOSIS — M75101 Unspecified rotator cuff tear or rupture of right shoulder, not specified as traumatic: Secondary | ICD-10-CM | POA: Diagnosis not present

## 2023-03-10 DIAGNOSIS — Z951 Presence of aortocoronary bypass graft: Secondary | ICD-10-CM | POA: Diagnosis not present

## 2023-03-10 DIAGNOSIS — I129 Hypertensive chronic kidney disease with stage 1 through stage 4 chronic kidney disease, or unspecified chronic kidney disease: Secondary | ICD-10-CM | POA: Diagnosis not present

## 2023-03-10 DIAGNOSIS — I4891 Unspecified atrial fibrillation: Secondary | ICD-10-CM | POA: Diagnosis not present

## 2023-03-10 DIAGNOSIS — S5012XD Contusion of left forearm, subsequent encounter: Secondary | ICD-10-CM | POA: Diagnosis not present

## 2023-03-10 DIAGNOSIS — E1122 Type 2 diabetes mellitus with diabetic chronic kidney disease: Secondary | ICD-10-CM | POA: Diagnosis not present

## 2023-03-10 DIAGNOSIS — Z7984 Long term (current) use of oral hypoglycemic drugs: Secondary | ICD-10-CM | POA: Diagnosis not present

## 2023-03-10 DIAGNOSIS — N1831 Chronic kidney disease, stage 3a: Secondary | ICD-10-CM | POA: Diagnosis not present

## 2023-03-12 ENCOUNTER — Ambulatory Visit: Payer: Medicare Other | Admitting: Family Medicine

## 2023-03-12 ENCOUNTER — Telehealth: Payer: Self-pay

## 2023-03-12 NOTE — Telephone Encounter (Signed)
Attempted to call Marlaine Hind regarding the provider's recommendation. No answer, left a brief vm msg to return a call back to the clinic. Direct call back info provided.

## 2023-03-12 NOTE — Telephone Encounter (Signed)
Patient contacted the office about concerns with a new symptom that just started about 4-5 days ago. He states it is described as "burning and stinging" and is intermittent but only last about a second. It is located to the right of his incision and it has gotten more noticeable as days progress. Advised that this sounds like neuropathy which can happen after surgery but advised that if it continues or gets worse to the point where it is not bearable he could contact the office back and we could get him in to see Dr. Laneta Simmers. He acknowledged receipt.

## 2023-03-16 DIAGNOSIS — Z48812 Encounter for surgical aftercare following surgery on the circulatory system: Secondary | ICD-10-CM | POA: Diagnosis not present

## 2023-03-16 DIAGNOSIS — E1122 Type 2 diabetes mellitus with diabetic chronic kidney disease: Secondary | ICD-10-CM | POA: Diagnosis not present

## 2023-03-16 DIAGNOSIS — I129 Hypertensive chronic kidney disease with stage 1 through stage 4 chronic kidney disease, or unspecified chronic kidney disease: Secondary | ICD-10-CM | POA: Diagnosis not present

## 2023-03-16 DIAGNOSIS — Z951 Presence of aortocoronary bypass graft: Secondary | ICD-10-CM | POA: Diagnosis not present

## 2023-03-16 DIAGNOSIS — H9191 Unspecified hearing loss, right ear: Secondary | ICD-10-CM | POA: Diagnosis not present

## 2023-03-16 DIAGNOSIS — S5012XD Contusion of left forearm, subsequent encounter: Secondary | ICD-10-CM | POA: Diagnosis not present

## 2023-03-16 DIAGNOSIS — Z955 Presence of coronary angioplasty implant and graft: Secondary | ICD-10-CM | POA: Diagnosis not present

## 2023-03-16 DIAGNOSIS — E1159 Type 2 diabetes mellitus with other circulatory complications: Secondary | ICD-10-CM | POA: Diagnosis not present

## 2023-03-16 DIAGNOSIS — Z7984 Long term (current) use of oral hypoglycemic drugs: Secondary | ICD-10-CM | POA: Diagnosis not present

## 2023-03-16 DIAGNOSIS — Z7902 Long term (current) use of antithrombotics/antiplatelets: Secondary | ICD-10-CM | POA: Diagnosis not present

## 2023-03-16 DIAGNOSIS — M75101 Unspecified rotator cuff tear or rupture of right shoulder, not specified as traumatic: Secondary | ICD-10-CM | POA: Diagnosis not present

## 2023-03-16 DIAGNOSIS — I252 Old myocardial infarction: Secondary | ICD-10-CM | POA: Diagnosis not present

## 2023-03-16 DIAGNOSIS — E1142 Type 2 diabetes mellitus with diabetic polyneuropathy: Secondary | ICD-10-CM | POA: Diagnosis not present

## 2023-03-16 DIAGNOSIS — Z7982 Long term (current) use of aspirin: Secondary | ICD-10-CM | POA: Diagnosis not present

## 2023-03-16 DIAGNOSIS — J432 Centrilobular emphysema: Secondary | ICD-10-CM | POA: Diagnosis not present

## 2023-03-16 DIAGNOSIS — Z95820 Peripheral vascular angioplasty status with implants and grafts: Secondary | ICD-10-CM | POA: Diagnosis not present

## 2023-03-16 DIAGNOSIS — I2510 Atherosclerotic heart disease of native coronary artery without angina pectoris: Secondary | ICD-10-CM | POA: Diagnosis not present

## 2023-03-16 DIAGNOSIS — E785 Hyperlipidemia, unspecified: Secondary | ICD-10-CM | POA: Diagnosis not present

## 2023-03-16 DIAGNOSIS — K5900 Constipation, unspecified: Secondary | ICD-10-CM | POA: Diagnosis not present

## 2023-03-16 DIAGNOSIS — G4700 Insomnia, unspecified: Secondary | ICD-10-CM | POA: Diagnosis not present

## 2023-03-16 DIAGNOSIS — N1831 Chronic kidney disease, stage 3a: Secondary | ICD-10-CM | POA: Diagnosis not present

## 2023-03-16 DIAGNOSIS — I4891 Unspecified atrial fibrillation: Secondary | ICD-10-CM | POA: Diagnosis not present

## 2023-03-23 DIAGNOSIS — Z951 Presence of aortocoronary bypass graft: Secondary | ICD-10-CM | POA: Diagnosis not present

## 2023-03-23 DIAGNOSIS — Z7902 Long term (current) use of antithrombotics/antiplatelets: Secondary | ICD-10-CM | POA: Diagnosis not present

## 2023-03-23 DIAGNOSIS — Z7982 Long term (current) use of aspirin: Secondary | ICD-10-CM | POA: Diagnosis not present

## 2023-03-23 DIAGNOSIS — Z95820 Peripheral vascular angioplasty status with implants and grafts: Secondary | ICD-10-CM | POA: Diagnosis not present

## 2023-03-23 DIAGNOSIS — S5012XD Contusion of left forearm, subsequent encounter: Secondary | ICD-10-CM | POA: Diagnosis not present

## 2023-03-23 DIAGNOSIS — G4700 Insomnia, unspecified: Secondary | ICD-10-CM | POA: Diagnosis not present

## 2023-03-23 DIAGNOSIS — Z7984 Long term (current) use of oral hypoglycemic drugs: Secondary | ICD-10-CM | POA: Diagnosis not present

## 2023-03-23 DIAGNOSIS — I2510 Atherosclerotic heart disease of native coronary artery without angina pectoris: Secondary | ICD-10-CM | POA: Diagnosis not present

## 2023-03-23 DIAGNOSIS — K5900 Constipation, unspecified: Secondary | ICD-10-CM | POA: Diagnosis not present

## 2023-03-23 DIAGNOSIS — Z48812 Encounter for surgical aftercare following surgery on the circulatory system: Secondary | ICD-10-CM | POA: Diagnosis not present

## 2023-03-23 DIAGNOSIS — E785 Hyperlipidemia, unspecified: Secondary | ICD-10-CM | POA: Diagnosis not present

## 2023-03-23 DIAGNOSIS — E1142 Type 2 diabetes mellitus with diabetic polyneuropathy: Secondary | ICD-10-CM | POA: Diagnosis not present

## 2023-03-23 DIAGNOSIS — M75101 Unspecified rotator cuff tear or rupture of right shoulder, not specified as traumatic: Secondary | ICD-10-CM | POA: Diagnosis not present

## 2023-03-23 DIAGNOSIS — I252 Old myocardial infarction: Secondary | ICD-10-CM | POA: Diagnosis not present

## 2023-03-23 DIAGNOSIS — E1159 Type 2 diabetes mellitus with other circulatory complications: Secondary | ICD-10-CM | POA: Diagnosis not present

## 2023-03-23 DIAGNOSIS — J432 Centrilobular emphysema: Secondary | ICD-10-CM | POA: Diagnosis not present

## 2023-03-23 DIAGNOSIS — I129 Hypertensive chronic kidney disease with stage 1 through stage 4 chronic kidney disease, or unspecified chronic kidney disease: Secondary | ICD-10-CM | POA: Diagnosis not present

## 2023-03-23 DIAGNOSIS — E1122 Type 2 diabetes mellitus with diabetic chronic kidney disease: Secondary | ICD-10-CM | POA: Diagnosis not present

## 2023-03-23 DIAGNOSIS — I4891 Unspecified atrial fibrillation: Secondary | ICD-10-CM | POA: Diagnosis not present

## 2023-03-23 DIAGNOSIS — H9191 Unspecified hearing loss, right ear: Secondary | ICD-10-CM | POA: Diagnosis not present

## 2023-03-23 DIAGNOSIS — N1831 Chronic kidney disease, stage 3a: Secondary | ICD-10-CM | POA: Diagnosis not present

## 2023-03-23 DIAGNOSIS — Z955 Presence of coronary angioplasty implant and graft: Secondary | ICD-10-CM | POA: Diagnosis not present

## 2023-03-24 DIAGNOSIS — Z7982 Long term (current) use of aspirin: Secondary | ICD-10-CM | POA: Diagnosis not present

## 2023-03-24 DIAGNOSIS — I2510 Atherosclerotic heart disease of native coronary artery without angina pectoris: Secondary | ICD-10-CM | POA: Diagnosis not present

## 2023-03-24 DIAGNOSIS — E1122 Type 2 diabetes mellitus with diabetic chronic kidney disease: Secondary | ICD-10-CM | POA: Diagnosis not present

## 2023-03-24 DIAGNOSIS — E1159 Type 2 diabetes mellitus with other circulatory complications: Secondary | ICD-10-CM | POA: Diagnosis not present

## 2023-03-24 DIAGNOSIS — Z951 Presence of aortocoronary bypass graft: Secondary | ICD-10-CM | POA: Diagnosis not present

## 2023-03-24 DIAGNOSIS — Z7984 Long term (current) use of oral hypoglycemic drugs: Secondary | ICD-10-CM | POA: Diagnosis not present

## 2023-03-24 DIAGNOSIS — M75101 Unspecified rotator cuff tear or rupture of right shoulder, not specified as traumatic: Secondary | ICD-10-CM | POA: Diagnosis not present

## 2023-03-24 DIAGNOSIS — S5012XD Contusion of left forearm, subsequent encounter: Secondary | ICD-10-CM | POA: Diagnosis not present

## 2023-03-24 DIAGNOSIS — Z48812 Encounter for surgical aftercare following surgery on the circulatory system: Secondary | ICD-10-CM | POA: Diagnosis not present

## 2023-03-24 DIAGNOSIS — Z7902 Long term (current) use of antithrombotics/antiplatelets: Secondary | ICD-10-CM | POA: Diagnosis not present

## 2023-03-24 DIAGNOSIS — N1831 Chronic kidney disease, stage 3a: Secondary | ICD-10-CM | POA: Diagnosis not present

## 2023-03-24 DIAGNOSIS — K5900 Constipation, unspecified: Secondary | ICD-10-CM | POA: Diagnosis not present

## 2023-03-24 DIAGNOSIS — Z95820 Peripheral vascular angioplasty status with implants and grafts: Secondary | ICD-10-CM | POA: Diagnosis not present

## 2023-03-24 DIAGNOSIS — G4700 Insomnia, unspecified: Secondary | ICD-10-CM | POA: Diagnosis not present

## 2023-03-24 DIAGNOSIS — H9191 Unspecified hearing loss, right ear: Secondary | ICD-10-CM | POA: Diagnosis not present

## 2023-03-24 DIAGNOSIS — I252 Old myocardial infarction: Secondary | ICD-10-CM | POA: Diagnosis not present

## 2023-03-24 DIAGNOSIS — E785 Hyperlipidemia, unspecified: Secondary | ICD-10-CM | POA: Diagnosis not present

## 2023-03-24 DIAGNOSIS — I4891 Unspecified atrial fibrillation: Secondary | ICD-10-CM | POA: Diagnosis not present

## 2023-03-24 DIAGNOSIS — E1142 Type 2 diabetes mellitus with diabetic polyneuropathy: Secondary | ICD-10-CM | POA: Diagnosis not present

## 2023-03-24 DIAGNOSIS — I129 Hypertensive chronic kidney disease with stage 1 through stage 4 chronic kidney disease, or unspecified chronic kidney disease: Secondary | ICD-10-CM | POA: Diagnosis not present

## 2023-03-24 DIAGNOSIS — J432 Centrilobular emphysema: Secondary | ICD-10-CM | POA: Diagnosis not present

## 2023-03-24 DIAGNOSIS — Z955 Presence of coronary angioplasty implant and graft: Secondary | ICD-10-CM | POA: Diagnosis not present

## 2023-04-01 DIAGNOSIS — Z951 Presence of aortocoronary bypass graft: Secondary | ICD-10-CM | POA: Diagnosis not present

## 2023-04-01 DIAGNOSIS — K5900 Constipation, unspecified: Secondary | ICD-10-CM | POA: Diagnosis not present

## 2023-04-01 DIAGNOSIS — Z7902 Long term (current) use of antithrombotics/antiplatelets: Secondary | ICD-10-CM | POA: Diagnosis not present

## 2023-04-01 DIAGNOSIS — S5012XD Contusion of left forearm, subsequent encounter: Secondary | ICD-10-CM | POA: Diagnosis not present

## 2023-04-01 DIAGNOSIS — I252 Old myocardial infarction: Secondary | ICD-10-CM | POA: Diagnosis not present

## 2023-04-01 DIAGNOSIS — E1159 Type 2 diabetes mellitus with other circulatory complications: Secondary | ICD-10-CM | POA: Diagnosis not present

## 2023-04-01 DIAGNOSIS — G4700 Insomnia, unspecified: Secondary | ICD-10-CM | POA: Diagnosis not present

## 2023-04-01 DIAGNOSIS — H9191 Unspecified hearing loss, right ear: Secondary | ICD-10-CM | POA: Diagnosis not present

## 2023-04-01 DIAGNOSIS — E785 Hyperlipidemia, unspecified: Secondary | ICD-10-CM | POA: Diagnosis not present

## 2023-04-01 DIAGNOSIS — Z95820 Peripheral vascular angioplasty status with implants and grafts: Secondary | ICD-10-CM | POA: Diagnosis not present

## 2023-04-01 DIAGNOSIS — E1142 Type 2 diabetes mellitus with diabetic polyneuropathy: Secondary | ICD-10-CM | POA: Diagnosis not present

## 2023-04-01 DIAGNOSIS — Z955 Presence of coronary angioplasty implant and graft: Secondary | ICD-10-CM | POA: Diagnosis not present

## 2023-04-01 DIAGNOSIS — I2510 Atherosclerotic heart disease of native coronary artery without angina pectoris: Secondary | ICD-10-CM | POA: Diagnosis not present

## 2023-04-01 DIAGNOSIS — J432 Centrilobular emphysema: Secondary | ICD-10-CM | POA: Diagnosis not present

## 2023-04-01 DIAGNOSIS — I129 Hypertensive chronic kidney disease with stage 1 through stage 4 chronic kidney disease, or unspecified chronic kidney disease: Secondary | ICD-10-CM | POA: Diagnosis not present

## 2023-04-01 DIAGNOSIS — E1122 Type 2 diabetes mellitus with diabetic chronic kidney disease: Secondary | ICD-10-CM | POA: Diagnosis not present

## 2023-04-01 DIAGNOSIS — M75101 Unspecified rotator cuff tear or rupture of right shoulder, not specified as traumatic: Secondary | ICD-10-CM | POA: Diagnosis not present

## 2023-04-01 DIAGNOSIS — Z7984 Long term (current) use of oral hypoglycemic drugs: Secondary | ICD-10-CM | POA: Diagnosis not present

## 2023-04-01 DIAGNOSIS — I4891 Unspecified atrial fibrillation: Secondary | ICD-10-CM | POA: Diagnosis not present

## 2023-04-01 DIAGNOSIS — N1831 Chronic kidney disease, stage 3a: Secondary | ICD-10-CM | POA: Diagnosis not present

## 2023-04-01 DIAGNOSIS — Z48812 Encounter for surgical aftercare following surgery on the circulatory system: Secondary | ICD-10-CM | POA: Diagnosis not present

## 2023-04-01 DIAGNOSIS — Z7982 Long term (current) use of aspirin: Secondary | ICD-10-CM | POA: Diagnosis not present

## 2023-04-02 ENCOUNTER — Ambulatory Visit: Payer: Medicare Other | Admitting: Cardiology

## 2023-04-03 DIAGNOSIS — E1142 Type 2 diabetes mellitus with diabetic polyneuropathy: Secondary | ICD-10-CM | POA: Diagnosis not present

## 2023-04-03 DIAGNOSIS — I2510 Atherosclerotic heart disease of native coronary artery without angina pectoris: Secondary | ICD-10-CM | POA: Diagnosis not present

## 2023-04-03 DIAGNOSIS — I4891 Unspecified atrial fibrillation: Secondary | ICD-10-CM | POA: Diagnosis not present

## 2023-04-03 DIAGNOSIS — M75101 Unspecified rotator cuff tear or rupture of right shoulder, not specified as traumatic: Secondary | ICD-10-CM | POA: Diagnosis not present

## 2023-04-03 DIAGNOSIS — E1122 Type 2 diabetes mellitus with diabetic chronic kidney disease: Secondary | ICD-10-CM | POA: Diagnosis not present

## 2023-04-03 DIAGNOSIS — Z48812 Encounter for surgical aftercare following surgery on the circulatory system: Secondary | ICD-10-CM | POA: Diagnosis not present

## 2023-04-03 DIAGNOSIS — Z955 Presence of coronary angioplasty implant and graft: Secondary | ICD-10-CM | POA: Diagnosis not present

## 2023-04-03 DIAGNOSIS — K5900 Constipation, unspecified: Secondary | ICD-10-CM | POA: Diagnosis not present

## 2023-04-03 DIAGNOSIS — H9191 Unspecified hearing loss, right ear: Secondary | ICD-10-CM | POA: Diagnosis not present

## 2023-04-03 DIAGNOSIS — J432 Centrilobular emphysema: Secondary | ICD-10-CM | POA: Diagnosis not present

## 2023-04-03 DIAGNOSIS — G4700 Insomnia, unspecified: Secondary | ICD-10-CM | POA: Diagnosis not present

## 2023-04-03 DIAGNOSIS — I252 Old myocardial infarction: Secondary | ICD-10-CM | POA: Diagnosis not present

## 2023-04-03 DIAGNOSIS — E785 Hyperlipidemia, unspecified: Secondary | ICD-10-CM | POA: Diagnosis not present

## 2023-04-03 DIAGNOSIS — Z7982 Long term (current) use of aspirin: Secondary | ICD-10-CM | POA: Diagnosis not present

## 2023-04-03 DIAGNOSIS — Z7902 Long term (current) use of antithrombotics/antiplatelets: Secondary | ICD-10-CM | POA: Diagnosis not present

## 2023-04-03 DIAGNOSIS — Z951 Presence of aortocoronary bypass graft: Secondary | ICD-10-CM | POA: Diagnosis not present

## 2023-04-03 DIAGNOSIS — Z7984 Long term (current) use of oral hypoglycemic drugs: Secondary | ICD-10-CM | POA: Diagnosis not present

## 2023-04-03 DIAGNOSIS — Z95820 Peripheral vascular angioplasty status with implants and grafts: Secondary | ICD-10-CM | POA: Diagnosis not present

## 2023-04-03 DIAGNOSIS — N1831 Chronic kidney disease, stage 3a: Secondary | ICD-10-CM | POA: Diagnosis not present

## 2023-04-03 DIAGNOSIS — S5012XD Contusion of left forearm, subsequent encounter: Secondary | ICD-10-CM | POA: Diagnosis not present

## 2023-04-03 DIAGNOSIS — E1159 Type 2 diabetes mellitus with other circulatory complications: Secondary | ICD-10-CM | POA: Diagnosis not present

## 2023-04-03 DIAGNOSIS — I129 Hypertensive chronic kidney disease with stage 1 through stage 4 chronic kidney disease, or unspecified chronic kidney disease: Secondary | ICD-10-CM | POA: Diagnosis not present

## 2023-04-06 DIAGNOSIS — Z7984 Long term (current) use of oral hypoglycemic drugs: Secondary | ICD-10-CM | POA: Diagnosis not present

## 2023-04-06 DIAGNOSIS — K5900 Constipation, unspecified: Secondary | ICD-10-CM | POA: Diagnosis not present

## 2023-04-06 DIAGNOSIS — G4700 Insomnia, unspecified: Secondary | ICD-10-CM | POA: Diagnosis not present

## 2023-04-06 DIAGNOSIS — H9191 Unspecified hearing loss, right ear: Secondary | ICD-10-CM | POA: Diagnosis not present

## 2023-04-06 DIAGNOSIS — J432 Centrilobular emphysema: Secondary | ICD-10-CM | POA: Diagnosis not present

## 2023-04-06 DIAGNOSIS — E1142 Type 2 diabetes mellitus with diabetic polyneuropathy: Secondary | ICD-10-CM | POA: Diagnosis not present

## 2023-04-06 DIAGNOSIS — I252 Old myocardial infarction: Secondary | ICD-10-CM | POA: Diagnosis not present

## 2023-04-06 DIAGNOSIS — I4891 Unspecified atrial fibrillation: Secondary | ICD-10-CM | POA: Diagnosis not present

## 2023-04-06 DIAGNOSIS — E785 Hyperlipidemia, unspecified: Secondary | ICD-10-CM | POA: Diagnosis not present

## 2023-04-06 DIAGNOSIS — Z7982 Long term (current) use of aspirin: Secondary | ICD-10-CM | POA: Diagnosis not present

## 2023-04-06 DIAGNOSIS — E1159 Type 2 diabetes mellitus with other circulatory complications: Secondary | ICD-10-CM | POA: Diagnosis not present

## 2023-04-06 DIAGNOSIS — Z955 Presence of coronary angioplasty implant and graft: Secondary | ICD-10-CM | POA: Diagnosis not present

## 2023-04-06 DIAGNOSIS — I129 Hypertensive chronic kidney disease with stage 1 through stage 4 chronic kidney disease, or unspecified chronic kidney disease: Secondary | ICD-10-CM | POA: Diagnosis not present

## 2023-04-06 DIAGNOSIS — E1122 Type 2 diabetes mellitus with diabetic chronic kidney disease: Secondary | ICD-10-CM | POA: Diagnosis not present

## 2023-04-06 DIAGNOSIS — Z48812 Encounter for surgical aftercare following surgery on the circulatory system: Secondary | ICD-10-CM | POA: Diagnosis not present

## 2023-04-06 DIAGNOSIS — Z951 Presence of aortocoronary bypass graft: Secondary | ICD-10-CM | POA: Diagnosis not present

## 2023-04-06 DIAGNOSIS — N1831 Chronic kidney disease, stage 3a: Secondary | ICD-10-CM | POA: Diagnosis not present

## 2023-04-06 DIAGNOSIS — Z95820 Peripheral vascular angioplasty status with implants and grafts: Secondary | ICD-10-CM | POA: Diagnosis not present

## 2023-04-06 DIAGNOSIS — M75101 Unspecified rotator cuff tear or rupture of right shoulder, not specified as traumatic: Secondary | ICD-10-CM | POA: Diagnosis not present

## 2023-04-06 DIAGNOSIS — S5012XD Contusion of left forearm, subsequent encounter: Secondary | ICD-10-CM | POA: Diagnosis not present

## 2023-04-06 DIAGNOSIS — Z7902 Long term (current) use of antithrombotics/antiplatelets: Secondary | ICD-10-CM | POA: Diagnosis not present

## 2023-04-06 DIAGNOSIS — I2510 Atherosclerotic heart disease of native coronary artery without angina pectoris: Secondary | ICD-10-CM | POA: Diagnosis not present

## 2023-04-15 ENCOUNTER — Ambulatory Visit (INDEPENDENT_AMBULATORY_CARE_PROVIDER_SITE_OTHER): Payer: Self-pay | Admitting: Surgery

## 2023-04-15 ENCOUNTER — Other Ambulatory Visit: Payer: Self-pay | Admitting: *Deleted

## 2023-04-15 VITALS — BP 123/75 | HR 84 | Resp 20 | Wt 208.0 lb

## 2023-04-15 DIAGNOSIS — Z951 Presence of aortocoronary bypass graft: Secondary | ICD-10-CM

## 2023-04-15 NOTE — Progress Notes (Signed)
HPI:  Patient returns for routine postoperative follow-up having undergone coronary bypass graft surgery x 3 on 01/30/2023. The patient's early postoperative recovery while in the hospital was notable for development of postoperative atrial fibrillation converted with amiodarone.  He also developed postoperative delirium which was worse at night and seemed to improve during the day.  He continued to improve and was discharged to a skilled nursing facility for 21 days. After going home he had a fall landing on his face with multiple abrasions and bruises but no serious injury. I saw him on 03/04/2023 shortly after that and he was not doing much activity and still recovering from the fall.   He is here with his son today. He continues to improve and is walking more. He denies any chest pain or shortness of breath.     Current Outpatient Medications  Medication Sig Dispense Refill   Accu-Chek Softclix Lancets lancets Use to check blood sugars twice daily 300 each 3   albuterol (VENTOLIN HFA) 108 (90 Base) MCG/ACT inhaler Inhale 2 puffs into the lungs every 6 (six) hours as needed for wheezing or shortness of breath. 8 g 0   amiodarone (PACERONE) 200 MG tablet Take 1 tablet (200 mg total) by mouth daily. 90 tablet 1   aspirin EC 81 MG tablet Take 1 tablet (81 mg total) by mouth daily. Swallow whole.     atorvastatin (LIPITOR) 80 MG tablet TAKE 1 TABLET BY MOUTH AT  BEDTIME 100 tablet 2   Blood Glucose Monitoring Suppl (ACCU-CHEK GUIDE ME) w/Device KIT Check glucose three times daily. Dx. Code E11.8 1 kit 3   clopidogrel (PLAVIX) 75 MG tablet Take 1 tablet (75 mg total) by mouth daily. 90 tablet 3   Empagliflozin-metFORMIN HCl (SYNJARDY) 12.5-500 MG TABS Take 1 tablet by mouth 2 (two) times daily. 180 tablet 0   glucose blood (ACCU-CHEK GUIDE) test strip Dx DM E11.9. Check fasting blood sugar every morning. 300 each PRN   insulin degludec (TRESIBA FLEXTOUCH) 100 UNIT/ML FlexTouch Pen Inject 20  Units into the skin daily. (Patient taking differently: Inject 20 Units into the skin daily as needed (high blood sugar per patient).) 1 mL 0   lisinopril (ZESTRIL) 2.5 MG tablet Take 2.5 mg by mouth daily.     memantine (NAMENDA TITRATION PACK) tablet pack Take by mouth See admin instructions. 5 mg/day for =1 week; 5 mg twice daily for =1 week; 15 mg/day given in 5 mg and 10 mg separated doses for =1 week; then 10 mg twice daily     metoprolol succinate (TOPROL-XL) 25 MG 24 hr tablet Take 1 tablet (25 mg total) by mouth daily. 90 tablet 3   polyethylene glycol powder (MIRALAX) 17 GM/SCOOP powder Take 255 g by mouth daily. Until soft bowel movement 116 g 0   Semaglutide,0.25 or 0.5MG /DOS, 2 MG/3ML SOPN Inject 0.5 mg into the skin every 7 (seven) days.     traZODone (DESYREL) 50 MG tablet Take 1 tablet (50 mg total) by mouth at bedtime as needed for sleep.     No current facility-administered medications for this visit.    Physical Exam: BP 123/75   Pulse 84   Resp 20   Wt 208 lb (94.3 kg)   SpO2 95%   BMI 27.44 kg/m  He looks well. Cardiac exam shows a regular rate and rhythm with normal heart sounds.  There is no murmur. Lungs are clear. The chest incision is healing well and the sternum is stable. His  leg incision is healing well and there is no peripheral edema.  Diagnostic Tests: None today  Impression: Overall I think he is making progress in his recovery which has been slow due to his baseline level of deconditioning. I encouraged him to continue ambulating as much as possible. He is waiting to hear from outpt cardiac rehab. His rhythm is stable in sinus so I told him to discontinue the amiodarone.  Plan:  He will continue to followup with his PCP and cardiology. He will return to see me if he has any problems with his incisions.   [name], MD [facility] [phone]

## 2023-04-20 ENCOUNTER — Ambulatory Visit (INDEPENDENT_AMBULATORY_CARE_PROVIDER_SITE_OTHER): Payer: Medicare Other | Admitting: Family Medicine

## 2023-04-20 ENCOUNTER — Encounter: Payer: Self-pay | Admitting: Family Medicine

## 2023-04-20 VITALS — BP 116/59 | HR 65 | Ht 73.0 in | Wt 210.0 lb

## 2023-04-20 DIAGNOSIS — E1122 Type 2 diabetes mellitus with diabetic chronic kidney disease: Secondary | ICD-10-CM | POA: Diagnosis not present

## 2023-04-20 DIAGNOSIS — Z951 Presence of aortocoronary bypass graft: Secondary | ICD-10-CM

## 2023-04-20 DIAGNOSIS — Z794 Long term (current) use of insulin: Secondary | ICD-10-CM

## 2023-04-20 DIAGNOSIS — Z23 Encounter for immunization: Secondary | ICD-10-CM

## 2023-04-20 DIAGNOSIS — N183 Chronic kidney disease, stage 3 unspecified: Secondary | ICD-10-CM

## 2023-04-20 DIAGNOSIS — E119 Type 2 diabetes mellitus without complications: Secondary | ICD-10-CM | POA: Diagnosis not present

## 2023-04-20 LAB — POCT GLYCOSYLATED HEMOGLOBIN (HGB A1C): Hemoglobin A1C: 6.3 % — AB (ref 4.0–5.6)

## 2023-04-20 NOTE — Assessment & Plan Note (Signed)
To hold his lisinopril 2.5 mg and had asked that we continue to hold it for now we will circle back in 3 to 4 months to see if he is doing well at that point in time and we can make an adjustment or restart if it seems reasonable.

## 2023-04-20 NOTE — Assessment & Plan Note (Signed)
Now on aspirin, beta-blocker and clopidogrel.  Blood pressure is well-controlled.  Follow-up coming up with cardiology soon.

## 2023-04-20 NOTE — Progress Notes (Signed)
Established Patient Office Visit  Subjective   Patient ID: Jon [name], male    DOB: [birth year]  Age: 77 y.o. MRN: [id number]  Chief Complaint  Patient presents with   Diabetes    HPI  He is here today for hospital follow-up the last time I saw him which was September 13 he had mated into the office and started feeling weak nauseated diaphoretic.  We called EMS and he was admitted directly.  He ended up having coronary artery bypass surgery with Dr. Rexanne Mano 3 days later on September 20.  He was hospitalized for a week and then was transferred to a rehab facility where he stayed for 22 days.  He has made a partial recovery.  He has had a lot of issues with strength and balance since being hospitalized but he feels like he is about 80% back to himself.  His incision is well-healed and he has followed up with the cardiothoracic surgeon he saw them last week on December 4.  He is taking aspirin and metoprolol and clopidogrel.  Diabetes - no hypoglycemic events. No wounds or sores that are not healing well. No increased thirst or urination. Checking glucose at home. Taking medications as prescribed without any side effects. Doing 40 units of Tresiba.  Thinks he is on the semaglutide but is not sure of the dose.  On semaglutide. Off the lisinopril   They stopped his amiodarone.  He is on metoprolol      ROS    Objective:     BP (!) 116/59   Pulse 65   Ht 6\' 1"  (1.854 m)   Wt 210 lb (95.3 kg)   SpO2 100%   BMI 27.71 kg/m    Physical Exam Vitals and nursing note reviewed.  Constitutional:      Appearance: Normal appearance.  HENT:     Head: Normocephalic and atraumatic.  Eyes:     Conjunctiva/sclera: Conjunctivae normal.  Cardiovascular:     Rate and Rhythm: Normal rate and regular rhythm.  Pulmonary:     Effort: Pulmonary effort is normal.     Breath sounds: Normal breath sounds.  Skin:    General: Skin is warm and dry.  Neurological:     Mental Status: He  is alert.  Psychiatric:        Mood and Affect: Mood normal.      Results for orders placed or performed in visit on 04/20/23  POCT HgB A1C  Result Value Ref Range   Hemoglobin A1C 6.3 (A) 4.0 - 5.6 %   HbA1c POC (<> result, manual entry)     HbA1c, POC (prediabetic range)     HbA1c, POC (controlled diabetic range)        The ASCVD Risk score (Arnett DK, et al., 2019) failed to calculate for the following reasons:   The patient has a prior MI or stroke diagnosis    Assessment & Plan:   Problem List Items Addressed This Visit       Endocrine   CKD stage 3 due to type 2 diabetes mellitus (HCC) (Chronic)    To hold his lisinopril 2.5 mg and had asked that we continue to hold it for now we will circle back in 3 to 4 months to see if he is doing well at that point in time and we can make an adjustment or restart if it seems reasonable.      Diabetes mellitus, type II, insulin dependent (HCC) - Primary  A1c was 6.3 today which is significantly for improved from prior 7.8.  He says he ate what he was given and did not really have a choice so I just really encouraged him to continue to work on eating healthy and staying as active as he can.  Continue Synjardy as well.      Relevant Orders   POCT HgB A1C (Completed)     Other   S/P CABG x 3    Now on aspirin, beta-blocker and clopidogrel.  Blood pressure is well-controlled.  Follow-up coming up with cardiology soon.      Other Visit Diagnoses     Encounter for immunization       Relevant Orders   Flu Vaccine Trivalent High Dose (Fluad) (Completed)       He will continue with home health.  And then they are trying to get him in with cardiac rehab.  Return in about 3 months (around 07/19/2023) for Diabetes follow-up.    Nani Gasser, MD

## 2023-04-20 NOTE — Patient Instructions (Signed)
Let me know if you are using the semaglutide?

## 2023-04-20 NOTE — Assessment & Plan Note (Addendum)
A1c was 6.3 today which is significantly for improved from prior 7.8.  He says he ate what he was given and did not really have a choice so I just really encouraged him to continue to work on eating healthy and staying as active as he can.  Continue Synjardy as well.

## 2023-04-22 ENCOUNTER — Other Ambulatory Visit: Payer: Self-pay | Admitting: Family Medicine

## 2023-04-23 ENCOUNTER — Encounter: Payer: Medicare Other | Attending: Cardiology | Admitting: *Deleted

## 2023-04-23 DIAGNOSIS — Z48812 Encounter for surgical aftercare following surgery on the circulatory system: Secondary | ICD-10-CM | POA: Insufficient documentation

## 2023-04-23 DIAGNOSIS — I252 Old myocardial infarction: Secondary | ICD-10-CM | POA: Insufficient documentation

## 2023-04-23 DIAGNOSIS — Z951 Presence of aortocoronary bypass graft: Secondary | ICD-10-CM | POA: Insufficient documentation

## 2023-04-23 NOTE — Progress Notes (Signed)
Initial phone call completed. Diagnosis can be found in [facility] 12/4. EP Orientation scheduled for Monday 12/16 at 1:30.

## 2023-04-27 ENCOUNTER — Encounter: Payer: Medicare Other | Admitting: *Deleted

## 2023-04-27 VITALS — Ht 73.0 in | Wt 209.8 lb

## 2023-04-27 DIAGNOSIS — Z951 Presence of aortocoronary bypass graft: Secondary | ICD-10-CM | POA: Diagnosis not present

## 2023-04-27 DIAGNOSIS — I252 Old myocardial infarction: Secondary | ICD-10-CM | POA: Diagnosis not present

## 2023-04-27 DIAGNOSIS — Z48812 Encounter for surgical aftercare following surgery on the circulatory system: Secondary | ICD-10-CM | POA: Diagnosis not present

## 2023-04-27 NOTE — Patient Instructions (Signed)
Patient Instructions  Patient Details  Name: Jon [name] MRN: [id number] Date of Birth: 30-Sep-1945 Referring Provider:  Marykay Lex, MD  Below are your personal goals for exercise, nutrition, and risk factors. Our goal is to help you stay on track towards obtaining and maintaining these goals. We will be discussing your progress on these goals with you throughout the program.  Initial Exercise Prescription:  Initial Exercise Prescription - 04/27/23 1500       Date of Initial Exercise RX and Referring Provider   Date 04/27/23    Referring Provider Dr. [name]      Oxygen   Maintain Oxygen Saturation 88% or higher      Treadmill   MPH 0.8    Grade 0    Minutes 15    METs 1.6      NuStep   Level 1    SPM 80    Minutes 15    METs 1.49      Arm Ergometer   Level 1    RPM 30    Minutes 15    METs 1.49      Track   Laps 10    Minutes 15    METs 1.54      Prescription Details   Frequency (times per week) 2    Duration Progress to 30 minutes of continuous aerobic without signs/symptoms of physical distress      Intensity   THRR 40-80% of Max Heartrate 100-129    Ratings of Perceived Exertion 11-13    Perceived Dyspnea 0-4      Progression   Progression Continue to progress workloads to maintain intensity without signs/symptoms of physical distress.      Resistance Training   Training Prescription Yes    Weight 4    Reps 10-15             Exercise Goals: Frequency: Be able to perform aerobic exercise two to three times per week in program working toward 2-5 days per week of home exercise.  Intensity: Work with a perceived exertion of 11 (fairly light) - 15 (hard) while following your exercise prescription.  We will make changes to your prescription with you as you progress through the program.   Duration: Be able to do 30 to 45 minutes of continuous aerobic exercise in addition to a 5 minute warm-up and a 5 minute cool-down routine.    Nutrition Goals: Your personal nutrition goals will be established when you do your nutrition analysis with the dietician.  The following are general nutrition guidelines to follow: Cholesterol < 200mg /day Sodium < 1500mg /day Fiber: Men over 50 yrs - 30 grams per day  Personal Goals:  Personal Goals and Risk Factors at Admission - 04/27/23 1548       Core Components/Risk Factors/Patient Goals on Admission    Weight Management Yes;Weight Maintenance    Intervention Weight Management: Develop a combined nutrition and exercise program designed to reach desired caloric intake, while maintaining appropriate intake of nutrient and fiber, sodium and fats, and appropriate energy expenditure required for the weight goal.;Weight Management: Provide education and appropriate resources to help participant work on and attain dietary goals.;Weight Management/Obesity: Establish reasonable short term and long term weight goals.    Admit Weight 209 lb 12.8 oz (95.2 kg)    Goal Weight: Short Term 210 lb (95.3 kg)    Goal Weight: Long Term 210 lb (95.3 kg)    Expected Outcomes Short Term: Continue to assess and  modify interventions until short term weight is achieved;Long Term: Adherence to nutrition and physical activity/exercise program aimed toward attainment of established weight goal;Weight Maintenance: Understanding of the daily nutrition guidelines, which includes 25-35% calories from fat, 7% or less cal from saturated fats, less than 200mg  cholesterol, less than 1.5gm of sodium, & 5 or more servings of fruits and vegetables daily;Understanding recommendations for meals to include 15-35% energy as protein, 25-35% energy from fat, 35-60% energy from carbohydrates, less than 200mg  of dietary cholesterol, 20-35 gm of total fiber daily;Understanding of distribution of calorie intake throughout the day with the consumption of 4-5 meals/snacks    Diabetes Yes    Intervention Provide education about proper  nutrition, including hydration, and aerobic/resistive exercise prescription along with prescribed medications to achieve blood glucose in normal ranges: Fasting glucose 65-99 mg/dL;Provide education about signs/symptoms and action to take for hypo/hyperglycemia.    Expected Outcomes Short Term: Participant verbalizes understanding of the signs/symptoms and immediate care of hyper/hypoglycemia, proper foot care and importance of medication, aerobic/resistive exercise and nutrition plan for blood glucose control.;Long Term: Attainment of HbA1C < 7%.    Hypertension Yes    Intervention Provide education on lifestyle modifcations including regular physical activity/exercise, weight management, moderate sodium restriction and increased consumption of fresh fruit, vegetables, and low fat dairy, alcohol moderation, and smoking cessation.;Monitor prescription use compliance.    Expected Outcomes Short Term: Continued assessment and intervention until BP is < 140/43mm HG in hypertensive participants. < 130/21mm HG in hypertensive participants with diabetes, heart failure or chronic kidney disease.;Long Term: Maintenance of blood pressure at goal levels.             Tobacco Use Initial Evaluation: Social History   Tobacco Use  Smoking Status Former   Current packs/day: 0.00   Average packs/day: 1 pack/day for 62.0 years (62.0 ttl pk-yrs)   Types: Cigarettes   Start date: 01/13/1960   Quit date: 01/12/2022   Years since quitting: 1.2  Smokeless Tobacco Never    Exercise Goals and Review:  Exercise Goals     Row Name 04/27/23 1550             Exercise Goals   Increase Physical Activity Yes       Intervention Provide advice, education, support and counseling about physical activity/exercise needs.;Develop an individualized exercise prescription for aerobic and resistive training based on initial evaluation findings, risk stratification, comorbidities and participant's personal goals.        Expected Outcomes Short Term: Attend rehab on a regular basis to increase amount of physical activity.;Long Term: Add in home exercise to make exercise part of routine and to increase amount of physical activity.;Long Term: Exercising regularly at least 3-5 days a week.       Increase Strength and Stamina Yes       Intervention Provide advice, education, support and counseling about physical activity/exercise needs.;Develop an individualized exercise prescription for aerobic and resistive training based on initial evaluation findings, risk stratification, comorbidities and participant's personal goals.       Expected Outcomes Short Term: Increase workloads from initial exercise prescription for resistance, speed, and METs.;Short Term: Perform resistance training exercises routinely during rehab and add in resistance training at home;Long Term: Improve cardiorespiratory fitness, muscular endurance and strength as measured by increased METs and functional capacity ( )       Able to understand and use rate of perceived exertion (RPE) scale Yes       Intervention Provide education and explanation on how  to use RPE scale       Expected Outcomes Short Term: Able to use RPE daily in rehab to express subjective intensity level;Long Term:  Able to use RPE to guide intensity level when exercising independently       Able to understand and use Dyspnea scale Yes       Intervention Provide education and explanation on how to use Dyspnea scale       Expected Outcomes Short Term: Able to use Dyspnea scale daily in rehab to express subjective sense of shortness of breath during exertion;Long Term: Able to use Dyspnea scale to guide intensity level when exercising independently       Knowledge and understanding of Target Heart Rate Range (THRR) Yes       Intervention Provide education and explanation of THRR including how the numbers were predicted and where they are located for reference       Expected Outcomes Short  Term: Able to state/look up THRR;Long Term: Able to use THRR to govern intensity when exercising independently;Short Term: Able to use daily as guideline for intensity in rehab       Able to check pulse independently Yes       Intervention Provide education and demonstration on how to check pulse in carotid and radial arteries.;Review the importance of being able to check your own pulse for safety during independent exercise       Expected Outcomes Short Term: Able to explain why pulse checking is important during independent exercise;Long Term: Able to check pulse independently and accurately       Understanding of Exercise Prescription Yes       Intervention Provide education, explanation, and written materials on patient's individual exercise prescription       Expected Outcomes Short Term: Able to explain program exercise prescription;Long Term: Able to explain home exercise prescription to exercise independently                Copy of goals given to participant.

## 2023-04-27 NOTE — Progress Notes (Signed)
Cardiac Individual Treatment Plan  Patient Details  Name: Jon [name] MRN: [id number] Date of Birth: [birth year] Referring Provider:   Flowsheet Row Cardiac Rehab from 04/27/2023 in [facility] Cardiac and Pulmonary Rehab  Referring Provider Dr. [name]       Initial Encounter Date:  Flowsheet Row Cardiac Rehab from 04/27/2023 in [facility] Cardiac and Pulmonary Rehab  Date 04/27/23       Visit Diagnosis: S/P CABG x 3  Patient's Home Medications on Admission:  Current Outpatient Medications:    Accu-Chek Softclix Lancets lancets, Use to check blood sugars twice daily, Disp: 300 each, Rfl: 3   aspirin EC 81 MG tablet, Take 1 tablet (81 mg total) by mouth daily. Swallow whole., Disp: , Rfl:    atorvastatin (LIPITOR) 80 MG tablet, TAKE 1 TABLET BY MOUTH AT  BEDTIME, Disp: 100 tablet, Rfl: 2   Blood Glucose Monitoring Suppl (ACCU-CHEK GUIDE ME) w/Device KIT, Check glucose three times daily. Dx. Code E11.8, Disp: 1 kit, Rfl: 3   clopidogrel (PLAVIX) 75 MG tablet, Take 1 tablet (75 mg total) by mouth daily., Disp: 90 tablet, Rfl: 3   Empagliflozin-metFORMIN HCl (SYNJARDY) 12.5-500 MG TABS, Take 1 tablet by mouth 2 (two) times daily., Disp: 180 tablet, Rfl: 0   glucose blood (ACCU-CHEK GUIDE) test strip, Dx DM E11.9. Check fasting blood sugar every morning., Disp: 300 each, Rfl: PRN   insulin degludec (TRESIBA FLEXTOUCH) 100 UNIT/ML FlexTouch Pen, Inject 20 Units into the skin daily. (Patient taking differently: Inject 20 Units into the skin daily as needed (high blood sugar per patient).), Disp: 1 mL, Rfl: 0   metoprolol succinate (TOPROL-XL) 25 MG 24 hr tablet, Take 1 tablet (25 mg total) by mouth daily., Disp: 90 tablet, Rfl: 3   Semaglutide,0.25 or 0.5MG /DOS, 2 MG/3ML SOPN, Inject 0.5 mg into the skin every 7 (seven) days., Disp: , Rfl:    traZODone (DESYREL) 50 MG tablet, Take 1 tablet (50 mg total) by mouth at bedtime as needed for sleep., Disp: , Rfl:   Past Medical History: Past  Medical History:  Diagnosis Date   BENIGN POSITIONAL VERTIGO 05/20/2010   Qualifier: Diagnosis of  By: Linford Arnold MD, Catherine     Centrilobular emphysema [facility])    Cerebrovascular disease 07/15/2018   Prior CVA & TIA. Carotid CTA ~50% Bilateral ICA,  Multifocal severe stenosis of the left V2 vertebral artery.  Right vertebral artery patent without significant stenosis.   Coronary artery disease involving native coronary artery with angina pectoris (HCC) 11/18/2017   Severe LM, LAD & RCA stenosis indicated on Coronary CTA => CaCardiac Cath 01/02/2022: dLM ~50%; mLAD 85%@D1  90% (1,1,1) - m-dLAD 90$ @ 65% D2 (0,1,1), RI 75%, pLCx 30%-OM1 60%, pRCA 25% - p-mRCA hazy 85%. Normal LVEF by Echo, EDP 16 mmHg. = Best option CABGrdiac Cath    Current every day smoker    No interest in quitting   Diabetes mellitus, type II, insulin dependent (HCC)    With peripheral neuropathy, peripheral vascular disease and coronary artery disease as complications.   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 => 12/17/2021: L Subclavia A PTA=Stent with resolution of Sx.   TIA (transient ischemic attack) 11/11/2021   Brief episode of left sided arm pain and slurred speech  Tobacco Use: Social History   Tobacco Use  Smoking Status Former   Current packs/day: 0.00   Average packs/day: 1 pack/day for 62.0 years (62.0 ttl pk-yrs)   Types: Cigarettes   Start date: 01/13/1960   Quit date: 01/12/2022   Years since quitting: 1.2  Smokeless Tobacco Never    Labs: Review Flowsheet  More data exists      Latest Ref Rng & Units 05/15/2022 12/04/2022 12/11/2022 01/30/2023 04/20/2023  Labs for ITP Cardiac and Pulmonary Rehab  Cholestrol 0 - 200 mg/dL - - 469  - -  LDL (calc) 0 - 99 mg/dL - - 51  - -   HDL-C >62 mg/dL - - 37  - -  Trlycerides <150 mg/dL - - 952  - -  Hemoglobin A1c 4.0 - 5.6 % 8.7  7.8  - - 6.3   PH, Arterial 7.35 - 7.45 - - 7.390  7.331  7.351  7.391  7.372  7.388  7.319  7.310  -  PCO2 arterial 32 - 48 mmHg - - 35.4  31.8  34.8  32.3  35.2  32.8  41.0  38.9  -  Bicarbonate 20.0 - 28.0 mmol/L - - 21.4  16.9  19.4  19.8  20.5  19.8  26.4  21.1  19.6  -  TCO2 22 - 32 mmol/L - - 22  18  20  21  22  25  21  24  23  28  22  21  21  23   -  Acid-base deficit 0.0 - 2.0 mmol/L - - 3.0  8.0  6.0  5.0  4.0  5.0  5.0  6.0  -  O2 Saturation % - - 95  99  99  99  100  100  83  100  100  -    Details       Multiple values from one day are sorted in reverse-chronological order          Exercise Target Goals: Exercise Program Goal: Individual exercise prescription set using results from initial 6 min walk test and THRR while considering  patient's activity barriers and safety.   Exercise Prescription Goal: Initial exercise prescription builds to 30-45 minutes a day of aerobic activity, 2-3 days per week.  Home exercise guidelines will be given to patient during program as part of exercise prescription that the participant will acknowledge.   Education: Aerobic Exercise: - Group verbal and visual presentation on the components of exercise prescription. Introduces F.I.T.T principle from ACSM for exercise prescriptions.  Reviews F.I.T.T. principles of aerobic exercise including progression. Written material given at graduation.   Education: Resistance Exercise: - Group verbal and visual presentation on the components of exercise prescription. Introduces F.I.T.T principle from ACSM for exercise prescriptions  Reviews F.I.T.T. principles of resistance exercise including progression. Written material given at graduation.    Education: Exercise & Equipment Safety: - Individual verbal instruction and demonstration of equipment use and safety with use of the equipment. Flowsheet Row  Cardiac Rehab from 04/27/2023 in [facility] Cardiac and Pulmonary Rehab  Date 04/27/23  Educator [facility]  Instruction Review Code 1- Verbalizes Understanding       Education: Exercise Physiology & General Exercise Guidelines: - Group verbal and written instruction with models to review the exercise physiology of the cardiovascular system and associated critical values. Provides general exercise guidelines with specific guidelines to those with heart or lung disease.    Education: Flexibility, Balance, Mind/Body Relaxation: - Group verbal and  visual presentation with interactive activity on the components of exercise prescription. Introduces F.I.T.T principle from ACSM for exercise prescriptions. Reviews F.I.T.T. principles of flexibility and balance exercise training including progression. Also discusses the mind body connection.  Reviews various relaxation techniques to help reduce and manage stress (i.e. Deep breathing, progressive muscle relaxation, and visualization). Balance handout provided to take home. Written material given at graduation.   Activity Barriers & Risk Stratification:  Activity Barriers & Cardiac Risk Stratification - 04/27/23 1543       Activity Barriers & Cardiac Risk Stratification   Activity Barriers Balance Concerns;Back Problems    Cardiac Risk Stratification High             6 Minute Walk:  6 Minute Walk     Row Name 04/27/23 1541         6 Minute Walk   Phase Initial     Distance 765 feet     Walk Time 6 minutes     # of Rest Breaks 0     MPH 1.45     METS 1.49     RPE 12     Perceived Dyspnea  1     VO2 Peak 5.2     Symptoms No     Resting HR 71 bpm     Resting BP 124/78     Resting Oxygen Saturation  98 %     Exercise Oxygen Saturation  during 6 min walk 99 %     Max Ex. HR 83 bpm     Max Ex. BP 120/82     2 Minute Post BP 122/82              Oxygen Initial Assessment:   Oxygen Re-Evaluation:   Oxygen Discharge (Final Oxygen  Re-Evaluation):   Initial Exercise Prescription:  Initial Exercise Prescription - 04/27/23 1500       Date of Initial Exercise RX and Referring Provider   Date 04/27/23    Referring Provider Dr. [name]      Oxygen   Maintain Oxygen Saturation 88% or higher      Treadmill   MPH 0.8    Grade 0    Minutes 15    METs 1.6      NuStep   Level 1    SPM 80    Minutes 15    METs 1.49      Arm Ergometer   Level 1    RPM 30    Minutes 15    METs 1.49      Track   Laps 10    Minutes 15    METs 1.54      Prescription Details   Frequency (times per week) 2    Duration Progress to 30 minutes of continuous aerobic without signs/symptoms of physical distress      Intensity   THRR 40-80% of Max Heartrate 100-129    Ratings of Perceived Exertion 11-13    Perceived Dyspnea 0-4      Progression   Progression Continue to progress workloads to maintain intensity without signs/symptoms of physical distress.      Resistance Training   Training Prescription Yes    Weight 4    Reps 10-15             Perform Capillary Blood Glucose checks as needed.  Exercise Prescription Changes:   Exercise Prescription Changes     Row Name 04/27/23 1500  Response to Exercise   Blood Pressure (Admit) 124/78       Blood Pressure (Exercise) 120/82       Blood Pressure (Exit) 122/82       Heart Rate (Admit) 71 bpm       Heart Rate (Exercise) 83 bpm       Heart Rate (Exit) 71 bpm       Oxygen Saturation (Admit) 98 %       Oxygen Saturation (Exercise) 99 %       Oxygen Saturation (Exit) 99 %       Rating of Perceived Exertion (Exercise) 12       Perceived Dyspnea (Exercise) 1       Symptoms none       Comments 6 MWT resutls                Exercise Comments:   Exercise Goals and Review:   Exercise Goals     Row Name 04/27/23 1550             Exercise Goals   Increase Physical Activity Yes       Intervention Provide advice, education, support  and counseling about physical activity/exercise needs.;Develop an individualized exercise prescription for aerobic and resistive training based on initial evaluation findings, risk stratification, comorbidities and participant's personal goals.       Expected Outcomes Short Term: Attend rehab on a regular basis to increase amount of physical activity.;Long Term: Add in home exercise to make exercise part of routine and to increase amount of physical activity.;Long Term: Exercising regularly at least 3-5 days a week.       Increase Strength and Stamina Yes       Intervention Provide advice, education, support and counseling about physical activity/exercise needs.;Develop an individualized exercise prescription for aerobic and resistive training based on initial evaluation findings, risk stratification, comorbidities and participant's personal goals.       Expected Outcomes Short Term: Increase workloads from initial exercise prescription for resistance, speed, and METs.;Short Term: Perform resistance training exercises routinely during rehab and add in resistance training at home;Long Term: Improve cardiorespiratory fitness, muscular endurance and strength as measured by increased METs and functional capacity ( )       Able to understand and use rate of perceived exertion (RPE) scale Yes       Intervention Provide education and explanation on how to use RPE scale       Expected Outcomes Short Term: Able to use RPE daily in rehab to express subjective intensity level;Long Term:  Able to use RPE to guide intensity level when exercising independently       Able to understand and use Dyspnea scale Yes       Intervention Provide education and explanation on how to use Dyspnea scale       Expected Outcomes Short Term: Able to use Dyspnea scale daily in rehab to express subjective sense of shortness of breath during exertion;Long Term: Able to use Dyspnea scale to guide intensity level when exercising  independently       Knowledge and understanding of Target Heart Rate Range (THRR) Yes       Intervention Provide education and explanation of THRR including how the numbers were predicted and where they are located for reference       Expected Outcomes Short Term: Able to state/look up THRR;Long Term: Able to use THRR to govern intensity when exercising independently;Short Term: Able to use daily as guideline for intensity  in rehab       Able to check pulse independently Yes       Intervention Provide education and demonstration on how to check pulse in carotid and radial arteries.;Review the importance of being able to check your own pulse for safety during independent exercise       Expected Outcomes Short Term: Able to explain why pulse checking is important during independent exercise;Long Term: Able to check pulse independently and accurately       Understanding of Exercise Prescription Yes       Intervention Provide education, explanation, and written materials on patient's individual exercise prescription       Expected Outcomes Short Term: Able to explain program exercise prescription;Long Term: Able to explain home exercise prescription to exercise independently                Exercise Goals Re-Evaluation :   Discharge Exercise Prescription (Final Exercise Prescription Changes):  Exercise Prescription Changes - 04/27/23 1500       Response to Exercise   Blood Pressure (Admit) 124/78    Blood Pressure (Exercise) 120/82    Blood Pressure (Exit) 122/82    Heart Rate (Admit) 71 bpm    Heart Rate (Exercise) 83 bpm    Heart Rate (Exit) 71 bpm    Oxygen Saturation (Admit) 98 %    Oxygen Saturation (Exercise) 99 %    Oxygen Saturation (Exit) 99 %    Rating of Perceived Exertion (Exercise) 12    Perceived Dyspnea (Exercise) 1    Symptoms none    Comments 6 MWT resutls             Nutrition:  Target Goals: Understanding of nutrition guidelines, daily intake of sodium  1500mg , cholesterol 200mg , calories 30% from fat and 7% or less from saturated fats, daily to have 5 or more servings of fruits and vegetables.  Education: All About Nutrition: -Group instruction provided by verbal, written material, interactive activities, discussions, models, and posters to present general guidelines for heart healthy nutrition including fat, fiber, MyPlate, the role of sodium in heart healthy nutrition, utilization of the nutrition label, and utilization of this knowledge for meal planning. Follow up email sent as well. Written material given at graduation.   Biometrics:  Pre Biometrics - 04/27/23 1546       Pre Biometrics   Height 6\' 1"  (1.854 m)    Weight 209 lb 12.8 oz (95.2 kg)    Waist Circumference 43.5 inches    Hip Circumference 41 inches    Waist to Hip Ratio 1.06 %    BMI (Calculated) 27.69    Single Leg Stand 2.12 seconds              Nutrition Therapy Plan and Nutrition Goals:  Nutrition Therapy & Goals - 04/27/23 1547       Nutrition Therapy   RD appointment deferred Yes             Nutrition Assessments:  MEDIFICTS Score Key: >=70 Need to make dietary changes  40-70 Heart Healthy Diet <= 40 Therapeutic Level Cholesterol Diet   Picture Your Plate Scores: <44 Unhealthy dietary pattern with much room for improvement. 41-50 Dietary pattern unlikely to meet recommendations for good health and room for improvement. 51-60 More healthful dietary pattern, with some room for improvement.  >60 Healthy dietary pattern, although there may be some specific behaviors that could be improved.    Nutrition Goals Re-Evaluation:   Nutrition Goals Discharge (Final  Nutrition Goals Re-Evaluation):   Psychosocial: Target Goals: Acknowledge presence or absence of significant depression and/or stress, maximize coping skills, provide positive support system. Participant is able to verbalize types and ability to use techniques and skills needed for  reducing stress and depression.   Education: Stress, Anxiety, and Depression - Group verbal and visual presentation to define topics covered.  Reviews how body is impacted by stress, anxiety, and depression.  Also discusses healthy ways to reduce stress and to treat/manage anxiety and depression.  Written material given at graduation.   Education: Sleep Hygiene -Provides group verbal and written instruction about how sleep can affect your health.  Define sleep hygiene, discuss sleep cycles and impact of sleep habits. Review good sleep hygiene tips.    Initial Review & Psychosocial Screening:  Initial Psych Review & Screening - 04/23/23 1502       Initial Review   Current issues with Current Stress Concerns    Source of Stress Concerns Unable to participate in former interests or hobbies;Unable to perform yard/household activities      Family Dynamics   Good Support System? Yes      Barriers   Psychosocial barriers to participate in program There are no identifiable barriers or psychosocial needs.;The patient should benefit from training in stress management and relaxation.      Screening Interventions   Interventions Encouraged to exercise;Provide feedback about the scores to participant;To provide support and resources with identified psychosocial needs    Expected Outcomes Short Term goal: Utilizing psychosocial counselor, staff and physician to assist with identification of specific Stressors or current issues interfering with healing process. Setting desired goal for each stressor or current issue identified.;Long Term Goal: Stressors or current issues are controlled or eliminated.;Short Term goal: Identification and review with participant of any Quality of Life or Depression concerns found by scoring the questionnaire.;Long Term goal: The participant improves quality of Life and PHQ9 Scores as seen by post scores and/or verbalization of changes             Quality of Life  Scores:   Scores of 19 and below usually indicate a poorer quality of life in these areas.  A difference of  2-3 points is a clinically meaningful difference.  A difference of 2-3 points in the total score of the Quality of Life Index has been associated with significant improvement in overall quality of life, self-image, physical symptoms, and general health in studies assessing change in quality of life.  PHQ-9: Review Flowsheet  More data exists      04/27/2023 12/04/2022 08/01/2022 01/20/2022 07/26/2021  Depression screen PHQ 2/9  Decreased Interest 0 0 0 0 0  Down, Depressed, Hopeless 0 0 0 0 0  PHQ - 2 Score 0 0 0 0 0  Altered sleeping 1 - - - -  Tired, decreased energy 1 - - - -  Change in appetite 0 - - - -  Feeling bad or failure about yourself  0 - - - -  Trouble concentrating 0 - - - -  Moving slowly or fidgety/restless 0 - - - -  Suicidal thoughts 0 - - - -  PHQ-9 Score 2 - - - -  Difficult doing work/chores Not difficult at all - - - -   Interpretation of Total Score  Total Score Depression Severity:  1-4 = Minimal depression, 5-9 = Mild depression, 10-14 = Moderate depression, 15-19 = Moderately severe depression, 20-27 = Severe depression   Psychosocial Evaluation and Intervention:  Psychosocial Evaluation - 04/23/23 1506       Psychosocial Evaluation & Interventions   Interventions Encouraged to exercise with the program and follow exercise prescription    Comments Mr. Proffer is 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 He has had a long recovery related to balance and walking. He has spent 22 days in a rehab facility and completed [facility] rehab. He is hesitant about the program because he is unsure what more rehab will do for him. We discussed how this is a more independent program that will help him strengthen his balance and regain muscle. He wants to work on his independence so he can maintain it.    Expected Outcomes Short: attend cardiac rehab for education and  exercise. Long: develop and maintain positive self care habits    Continue Psychosocial Services  Follow up required by staff             Psychosocial Re-Evaluation:   Psychosocial Discharge (Final Psychosocial Re-Evaluation):   Vocational Rehabilitation: Provide vocational rehab assistance to qualifying candidates.   Vocational Rehab Evaluation & Intervention:  Vocational Rehab - 04/23/23 1502       Initial Vocational Rehab Evaluation & Intervention   Assessment shows need for Vocational Rehabilitation No             Education: Education Goals: Education classes will be provided on a variety of topics geared toward better understanding of heart health and risk factor modification. Participant will state understanding/return demonstration of topics presented as noted by education test scores.  Learning Barriers/Preferences:  Learning Barriers/Preferences - 04/23/23 1502       Learning Barriers/Preferences   Learning Barriers None    Learning Preferences Individual Instruction             General Cardiac Education Topics:  AED/CPR: - Group verbal and written instruction with the use of models to demonstrate the basic use of the AED with the basic ABC's of resuscitation.   Anatomy and Cardiac Procedures: - Group verbal and visual presentation and models provide information about basic cardiac anatomy and function. Reviews the testing methods done to diagnose heart disease and the outcomes of the test results. Describes the treatment choices: Medical Management, Angioplasty, or Coronary Bypass Surgery for treating various heart conditions including Myocardial Infarction, Angina, Valve Disease, and Cardiac Arrhythmias.  Written material given at graduation.   Medication Safety: - Group verbal and visual instruction to review commonly prescribed medications for heart and lung disease. Reviews the medication, class of the drug, and side effects. Includes the steps  to properly store meds and maintain the prescription regimen.  Written material given at graduation.   Intimacy: - Group verbal instruction through game format to discuss how heart and lung disease can affect sexual intimacy. Written material given at graduation..   Know Your Numbers and Heart Failure: - Group verbal and visual instruction to discuss disease risk factors for cardiac and pulmonary disease and treatment options.  Reviews associated critical values for Overweight/Obesity, Hypertension, Cholesterol, and Diabetes.  Discusses basics of heart failure: signs/symptoms and treatments.  Introduces Heart Failure Zone chart for action plan for heart failure.  Written material given at graduation.   Infection Prevention: - Provides verbal and written material to individual with discussion of infection control including proper hand washing and proper equipment cleaning during exercise session. Flowsheet Row Cardiac Rehab from 04/27/2023 in [facility] Cardiac and Pulmonary Rehab  Date 04/27/23  Educator [employer] [facility]  Instruction Review Code 1- [employer]  Falls Prevention: - Provides verbal and written material to individual with discussion of falls prevention and safety. Flowsheet Row Cardiac Rehab from 04/27/2023 in [facility] Cardiac and Pulmonary Rehab  Date 04/27/23  Educator [facility]  Instruction Review Code 1- Verbalizes Understanding       Other: -Provides group and verbal instruction on various topics (see comments)   Knowledge Questionnaire Score:   Core Components/Risk Factors/Patient Goals at Admission:  Personal Goals and Risk Factors at Admission - 04/27/23 1548       Core Components/Risk Factors/Patient Goals on Admission    Weight Management Yes;Weight Maintenance    Intervention Weight Management: Develop a combined nutrition and exercise program designed to reach desired caloric intake, while maintaining appropriate intake of nutrient and fiber, sodium and fats,  and appropriate energy expenditure required for the weight goal.;Weight Management: Provide education and appropriate resources to help participant work on and attain dietary goals.;Weight Management/Obesity: Establish reasonable short term and long term weight goals.    Admit Weight 209 lb 12.8 oz (95.2 kg)    Goal Weight: Short Term 210 lb (95.3 kg)    Goal Weight: Long Term 210 lb (95.3 kg)    Expected Outcomes Short Term: Continue to assess and modify interventions until short term weight is achieved;Long Term: Adherence to nutrition and physical activity/exercise program aimed toward attainment of established weight goal;Weight Maintenance: Understanding of the daily nutrition guidelines, which includes 25-35% calories from fat, 7% or less cal from saturated fats, less than 200mg  cholesterol, less than 1.5gm of sodium, & 5 or more servings of fruits and vegetables daily;Understanding recommendations for meals to include 15-35% energy as protein, 25-35% energy from fat, 35-60% energy from carbohydrates, less than 200mg  of dietary cholesterol, 20-35 gm of total fiber daily;Understanding of distribution of calorie intake throughout the day with the consumption of 4-5 meals/snacks    Diabetes Yes    Intervention Provide education about proper nutrition, including hydration, and aerobic/resistive exercise prescription along with prescribed medications to achieve blood glucose in normal ranges: Fasting glucose 65-99 mg/dL;Provide education about signs/symptoms and action to take for hypo/hyperglycemia.    Expected Outcomes Short Term: Participant verbalizes understanding of the signs/symptoms and immediate care of hyper/hypoglycemia, proper foot care and importance of medication, aerobic/resistive exercise and nutrition plan for blood glucose control.;Long Term: Attainment of HbA1C < 7%.    Hypertension Yes    Intervention Provide education on lifestyle modifcations including regular physical  activity/exercise, weight management, moderate sodium restriction and increased consumption of fresh fruit, vegetables, and low fat dairy, alcohol moderation, and smoking cessation.;Monitor prescription use compliance.    Expected Outcomes Short Term: Continued assessment and intervention until BP is < 140/28mm HG in hypertensive participants. < 130/69mm HG in hypertensive participants with diabetes, heart failure or chronic kidney disease.;Long Term: Maintenance of blood pressure at goal levels.             Education:Diabetes - Individual verbal and written instruction to review signs/symptoms of diabetes, desired ranges of glucose level fasting, after meals and with exercise. Acknowledge that pre and post exercise glucose checks will be done for 3 sessions at entry of program. Flowsheet Row Cardiac Rehab from 04/27/2023 in [facility]  Date 04/27/23  Educator [facility]  Instruction Review Code 1- Verbalizes Understanding       Core Components/Risk Factors/Patient Goals Review:    Core Components/Risk Factors/Patient Goals at Discharge (Final Review):    ITP Comments:  ITP Comments     Row Name 04/23/23  1500 04/27/23 1541         ITP Comments Initial phone call completed. Diagnosis can be found in [facility] 12/4. EP Orientation scheduled for Monday 12/16 at 1:30. Completed and gym orientation. Initial ITP created and sent for review to Dr. Bethann Punches, Medical Director.               Comments: initial ITP

## 2023-04-29 ENCOUNTER — Encounter: Payer: Self-pay | Admitting: *Deleted

## 2023-04-29 DIAGNOSIS — Z951 Presence of aortocoronary bypass graft: Secondary | ICD-10-CM

## 2023-04-29 NOTE — Progress Notes (Signed)
Cardiac Individual Treatment Plan  Patient Details  Name: Jon [name] MRN: [id number] Date of Birth: [birth year] Referring Provider:   Flowsheet Row Cardiac Rehab from 04/27/2023 in [facility] Cardiac and Pulmonary Rehab  Referring Provider Dr. [name]       Initial Encounter Date:  Flowsheet Row Cardiac Rehab from 04/27/2023 in [facility] Cardiac and Pulmonary Rehab  Date 04/27/23       Visit Diagnosis: S/P CABG x 3  Patient's Home Medications on Admission:  Current Outpatient Medications:    Accu-Chek Softclix Lancets lancets, Use to check blood sugars twice daily, Disp: 300 each, Rfl: 3   aspirin EC 81 MG tablet, Take 1 tablet (81 mg total) by mouth daily. Swallow whole., Disp: , Rfl:    atorvastatin (LIPITOR) 80 MG tablet, TAKE 1 TABLET BY MOUTH AT  BEDTIME, Disp: 100 tablet, Rfl: 2   Blood Glucose Monitoring Suppl (ACCU-CHEK GUIDE ME) w/Device KIT, Check glucose three times daily. Dx. Code E11.8, Disp: 1 kit, Rfl: 3   clopidogrel (PLAVIX) 75 MG tablet, Take 1 tablet (75 mg total) by mouth daily., Disp: 90 tablet, Rfl: 3   Empagliflozin-metFORMIN HCl (SYNJARDY) 12.5-500 MG TABS, Take 1 tablet by mouth 2 (two) times daily., Disp: 180 tablet, Rfl: 0   glucose blood (ACCU-CHEK GUIDE) test strip, Dx DM E11.9. Check fasting blood sugar every morning., Disp: 300 each, Rfl: PRN   insulin degludec (TRESIBA FLEXTOUCH) 100 UNIT/ML FlexTouch Pen, Inject 20 Units into the skin daily. (Patient taking differently: Inject 20 Units into the skin daily as needed (high blood sugar per patient).), Disp: 1 mL, Rfl: 0   metoprolol succinate (TOPROL-XL) 25 MG 24 hr tablet, Take 1 tablet (25 mg total) by mouth daily., Disp: 90 tablet, Rfl: 3   Semaglutide,0.25 or 0.5MG /DOS, 2 MG/3ML SOPN, Inject 0.5 mg into the skin every 7 (seven) days., Disp: , Rfl:    traZODone (DESYREL) 50 MG tablet, Take 1 tablet (50 mg total) by mouth at bedtime as needed for sleep., Disp: , Rfl:   Past Medical History: Past  Medical History:  Diagnosis Date   BENIGN POSITIONAL VERTIGO 05/20/2010   Qualifier: Diagnosis of  By: Linford Arnold MD, Catherine     Centrilobular emphysema [facility])    Cerebrovascular disease 07/15/2018   Prior CVA & TIA. Carotid CTA ~50% Bilateral ICA,  Multifocal severe stenosis of the left V2 vertebral artery.  Right vertebral artery patent without significant stenosis.   Coronary artery disease involving native coronary artery with angina pectoris (HCC) 11/18/2017   Severe LM, LAD & RCA stenosis indicated on Coronary CTA => CaCardiac Cath 01/02/2022: dLM ~50%; mLAD 85%@D1  90% (1,1,1) - m-dLAD 90$ @ 65% D2 (0,1,1), RI 75%, pLCx 30%-OM1 60%, pRCA 25% - p-mRCA hazy 85%. Normal LVEF by Echo, EDP 16 mmHg. = Best option CABGrdiac Cath    Current every day smoker    No interest in quitting   Diabetes mellitus, type II, insulin dependent (HCC)    With peripheral neuropathy, peripheral vascular disease and coronary artery disease as complications.   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 => 12/17/2021: L Subclavia A PTA=Stent with resolution of Sx.   TIA (transient ischemic attack) 11/11/2021   Brief episode of left sided arm pain and slurred speech  Tobacco Use: Social History   Tobacco Use  Smoking Status Former   Current packs/day: 0.00   Average packs/day: 1 pack/day for 62.0 years (62.0 ttl pk-yrs)   Types: Cigarettes   Start date: 01/13/1960   Quit date: 01/12/2022   Years since quitting: 1.2  Smokeless Tobacco Never    Labs: Review Flowsheet  More data exists      Latest Ref Rng & Units 05/15/2022 12/04/2022 12/11/2022 01/30/2023 04/20/2023  Labs for ITP Cardiac and Pulmonary Rehab  Cholestrol 0 - 200 mg/dL - - 161  - -  LDL (calc) 0 - 99 mg/dL - - 51  - -   HDL-C >09 mg/dL - - 37  - -  Trlycerides <150 mg/dL - - 604  - -  Hemoglobin A1c 4.0 - 5.6 % 8.7  7.8  - - 6.3   PH, Arterial 7.35 - 7.45 - - 7.390  7.331  7.351  7.391  7.372  7.388  7.319  7.310  -  PCO2 arterial 32 - 48 mmHg - - 35.4  31.8  34.8  32.3  35.2  32.8  41.0  38.9  -  Bicarbonate 20.0 - 28.0 mmol/L - - 21.4  16.9  19.4  19.8  20.5  19.8  26.4  21.1  19.6  -  TCO2 22 - 32 mmol/L - - 22  18  20  21  22  25  21  24  23  28  22  21  21  23   -  Acid-base deficit 0.0 - 2.0 mmol/L - - 3.0  8.0  6.0  5.0  4.0  5.0  5.0  6.0  -  O2 Saturation % - - 95  99  99  99  100  100  83  100  100  -    Details       Multiple values from one day are sorted in reverse-chronological order          Exercise Target Goals: Exercise Program Goal: Individual exercise prescription set using results from initial 6 min walk test and THRR while considering  patient's activity barriers and safety.   Exercise Prescription Goal: Initial exercise prescription builds to 30-45 minutes a day of aerobic activity, 2-3 days per week.  Home exercise guidelines will be given to patient during program as part of exercise prescription that the participant will acknowledge.   Education: Aerobic Exercise: - Group verbal and visual presentation on the components of exercise prescription. Introduces F.I.T.T principle from ACSM for exercise prescriptions.  Reviews F.I.T.T. principles of aerobic exercise including progression. Written material given at graduation.   Education: Resistance Exercise: - Group verbal and visual presentation on the components of exercise prescription. Introduces F.I.T.T principle from ACSM for exercise prescriptions  Reviews F.I.T.T. principles of resistance exercise including progression. Written material given at graduation.    Education: Exercise & Equipment Safety: - Individual verbal instruction and demonstration of equipment use and safety with use of the equipment. Flowsheet Row  Cardiac Rehab from 04/27/2023 in [facility] Cardiac and Pulmonary Rehab  Date 04/27/23  Educator [facility]  Instruction Review Code 1- Verbalizes Understanding       Education: Exercise Physiology & General Exercise Guidelines: - Group verbal and written instruction with models to review the exercise physiology of the cardiovascular system and associated critical values. Provides general exercise guidelines with specific guidelines to those with heart or lung disease.    Education: Flexibility, Balance, Mind/Body Relaxation: - Group verbal and  visual presentation with interactive activity on the components of exercise prescription. Introduces F.I.T.T principle from ACSM for exercise prescriptions. Reviews F.I.T.T. principles of flexibility and balance exercise training including progression. Also discusses the mind body connection.  Reviews various relaxation techniques to help reduce and manage stress (i.e. Deep breathing, progressive muscle relaxation, and visualization). Balance handout provided to take home. Written material given at graduation.   Activity Barriers & Risk Stratification:  Activity Barriers & Cardiac Risk Stratification - 04/27/23 1543       Activity Barriers & Cardiac Risk Stratification   Activity Barriers Balance Concerns;Back Problems    Cardiac Risk Stratification High             6 Minute Walk:  6 Minute Walk     Row Name 04/27/23 1541         6 Minute Walk   Phase Initial     Distance 765 feet     Walk Time 6 minutes     # of Rest Breaks 0     MPH 1.45     METS 1.49     RPE 12     Perceived Dyspnea  1     VO2 Peak 5.2     Symptoms No     Resting HR 71 bpm     Resting BP 124/78     Resting Oxygen Saturation  98 %     Exercise Oxygen Saturation  during 6 min walk 99 %     Max Ex. HR 83 bpm     Max Ex. BP 120/82     2 Minute Post BP 122/82              Oxygen Initial Assessment:   Oxygen Re-Evaluation:   Oxygen Discharge (Final Oxygen  Re-Evaluation):   Initial Exercise Prescription:  Initial Exercise Prescription - 04/27/23 1500       Date of Initial Exercise RX and Referring Provider   Date 04/27/23    Referring Provider Dr. [name]      Oxygen   Maintain Oxygen Saturation 88% or higher      Treadmill   MPH 0.8    Grade 0    Minutes 15    METs 1.6      NuStep   Level 1    SPM 80    Minutes 15    METs 1.49      Arm Ergometer   Level 1    RPM 30    Minutes 15    METs 1.49      Track   Laps 10    Minutes 15    METs 1.54      Prescription Details   Frequency (times per week) 2    Duration Progress to 30 minutes of continuous aerobic without signs/symptoms of physical distress      Intensity   THRR 40-80% of Max Heartrate 100-129    Ratings of Perceived Exertion 11-13    Perceived Dyspnea 0-4      Progression   Progression Continue to progress workloads to maintain intensity without signs/symptoms of physical distress.      Resistance Training   Training Prescription Yes    Weight 4    Reps 10-15             Perform Capillary Blood Glucose checks as needed.  Exercise Prescription Changes:   Exercise Prescription Changes     Row Name 04/27/23 1500  Response to Exercise   Blood Pressure (Admit) 124/78       Blood Pressure (Exercise) 120/82       Blood Pressure (Exit) 122/82       Heart Rate (Admit) 71 bpm       Heart Rate (Exercise) 83 bpm       Heart Rate (Exit) 71 bpm       Oxygen Saturation (Admit) 98 %       Oxygen Saturation (Exercise) 99 %       Oxygen Saturation (Exit) 99 %       Rating of Perceived Exertion (Exercise) 12       Perceived Dyspnea (Exercise) 1       Symptoms none       Comments 6 MWT resutls                Exercise Comments:   Exercise Goals and Review:   Exercise Goals     Row Name 04/27/23 1550             Exercise Goals   Increase Physical Activity Yes       Intervention Provide advice, education, support  and counseling about physical activity/exercise needs.;Develop an individualized exercise prescription for aerobic and resistive training based on initial evaluation findings, risk stratification, comorbidities and participant's personal goals.       Expected Outcomes Short Term: Attend rehab on a regular basis to increase amount of physical activity.;Long Term: Add in home exercise to make exercise part of routine and to increase amount of physical activity.;Long Term: Exercising regularly at least 3-5 days a week.       Increase Strength and Stamina Yes       Intervention Provide advice, education, support and counseling about physical activity/exercise needs.;Develop an individualized exercise prescription for aerobic and resistive training based on initial evaluation findings, risk stratification, comorbidities and participant's personal goals.       Expected Outcomes Short Term: Increase workloads from initial exercise prescription for resistance, speed, and METs.;Short Term: Perform resistance training exercises routinely during rehab and add in resistance training at home;Long Term: Improve cardiorespiratory fitness, muscular endurance and strength as measured by increased METs and functional capacity ( )       Able to understand and use rate of perceived exertion (RPE) scale Yes       Intervention Provide education and explanation on how to use RPE scale       Expected Outcomes Short Term: Able to use RPE daily in rehab to express subjective intensity level;Long Term:  Able to use RPE to guide intensity level when exercising independently       Able to understand and use Dyspnea scale Yes       Intervention Provide education and explanation on how to use Dyspnea scale       Expected Outcomes Short Term: Able to use Dyspnea scale daily in rehab to express subjective sense of shortness of breath during exertion;Long Term: Able to use Dyspnea scale to guide intensity level when exercising  independently       Knowledge and understanding of Target Heart Rate Range (THRR) Yes       Intervention Provide education and explanation of THRR including how the numbers were predicted and where they are located for reference       Expected Outcomes Short Term: Able to state/look up THRR;Long Term: Able to use THRR to govern intensity when exercising independently;Short Term: Able to use daily as guideline for intensity  in rehab       Able to check pulse independently Yes       Intervention Provide education and demonstration on how to check pulse in carotid and radial arteries.;Review the importance of being able to check your own pulse for safety during independent exercise       Expected Outcomes Short Term: Able to explain why pulse checking is important during independent exercise;Long Term: Able to check pulse independently and accurately       Understanding of Exercise Prescription Yes       Intervention Provide education, explanation, and written materials on patient's individual exercise prescription       Expected Outcomes Short Term: Able to explain program exercise prescription;Long Term: Able to explain home exercise prescription to exercise independently                Exercise Goals Re-Evaluation :   Discharge Exercise Prescription (Final Exercise Prescription Changes):  Exercise Prescription Changes - 04/27/23 1500       Response to Exercise   Blood Pressure (Admit) 124/78    Blood Pressure (Exercise) 120/82    Blood Pressure (Exit) 122/82    Heart Rate (Admit) 71 bpm    Heart Rate (Exercise) 83 bpm    Heart Rate (Exit) 71 bpm    Oxygen Saturation (Admit) 98 %    Oxygen Saturation (Exercise) 99 %    Oxygen Saturation (Exit) 99 %    Rating of Perceived Exertion (Exercise) 12    Perceived Dyspnea (Exercise) 1    Symptoms none    Comments 6 MWT resutls             Nutrition:  Target Goals: Understanding of nutrition guidelines, daily intake of sodium  1500mg , cholesterol 200mg , calories 30% from fat and 7% or less from saturated fats, daily to have 5 or more servings of fruits and vegetables.  Education: All About Nutrition: -Group instruction provided by verbal, written material, interactive activities, discussions, models, and posters to present general guidelines for heart healthy nutrition including fat, fiber, MyPlate, the role of sodium in heart healthy nutrition, utilization of the nutrition label, and utilization of this knowledge for meal planning. Follow up email sent as well. Written material given at graduation.   Biometrics:  Pre Biometrics - 04/27/23 1546       Pre Biometrics   Height 6\' 1"  (1.854 m)    Weight 209 lb 12.8 oz (95.2 kg)    Waist Circumference 43.5 inches    Hip Circumference 41 inches    Waist to Hip Ratio 1.06 %    BMI (Calculated) 27.69    Single Leg Stand 2.12 seconds              Nutrition Therapy Plan and Nutrition Goals:  Nutrition Therapy & Goals - 04/27/23 1547       Nutrition Therapy   RD appointment deferred Yes             Nutrition Assessments:  MEDIFICTS Score Key: >=70 Need to make dietary changes  40-70 Heart Healthy Diet <= 40 Therapeutic Level Cholesterol Diet   Picture Your Plate Scores: <08 Unhealthy dietary pattern with much room for improvement. 41-50 Dietary pattern unlikely to meet recommendations for good health and room for improvement. 51-60 More healthful dietary pattern, with some room for improvement.  >60 Healthy dietary pattern, although there may be some specific behaviors that could be improved.    Nutrition Goals Re-Evaluation:   Nutrition Goals Discharge (Final  Nutrition Goals Re-Evaluation):   Psychosocial: Target Goals: Acknowledge presence or absence of significant depression and/or stress, maximize coping skills, provide positive support system. Participant is able to verbalize types and ability to use techniques and skills needed for  reducing stress and depression.   Education: Stress, Anxiety, and Depression - Group verbal and visual presentation to define topics covered.  Reviews how body is impacted by stress, anxiety, and depression.  Also discusses healthy ways to reduce stress and to treat/manage anxiety and depression.  Written material given at graduation.   Education: Sleep Hygiene -Provides group verbal and written instruction about how sleep can affect your health.  Define sleep hygiene, discuss sleep cycles and impact of sleep habits. Review good sleep hygiene tips.    Initial Review & Psychosocial Screening:  Initial Psych Review & Screening - 04/23/23 1502       Initial Review   Current issues with Current Stress Concerns    Source of Stress Concerns Unable to participate in former interests or hobbies;Unable to perform yard/household activities      Family Dynamics   Good Support System? Yes      Barriers   Psychosocial barriers to participate in program There are no identifiable barriers or psychosocial needs.;The patient should benefit from training in stress management and relaxation.      Screening Interventions   Interventions Encouraged to exercise;Provide feedback about the scores to participant;To provide support and resources with identified psychosocial needs    Expected Outcomes Short Term goal: Utilizing psychosocial counselor, staff and physician to assist with identification of specific Stressors or current issues interfering with healing process. Setting desired goal for each stressor or current issue identified.;Long Term Goal: Stressors or current issues are controlled or eliminated.;Short Term goal: Identification and review with participant of any Quality of Life or Depression concerns found by scoring the questionnaire.;Long Term goal: The participant improves quality of Life and PHQ9 Scores as seen by post scores and/or verbalization of changes             Quality of Life  Scores:   Scores of 19 and below usually indicate a poorer quality of life in these areas.  A difference of  2-3 points is a clinically meaningful difference.  A difference of 2-3 points in the total score of the Quality of Life Index has been associated with significant improvement in overall quality of life, self-image, physical symptoms, and general health in studies assessing change in quality of life.  PHQ-9: Review Flowsheet  More data exists      04/27/2023 12/04/2022 08/01/2022 01/20/2022 07/26/2021  Depression screen PHQ 2/9  Decreased Interest 0 0 0 0 0  Down, Depressed, Hopeless 0 0 0 0 0  PHQ - 2 Score 0 0 0 0 0  Altered sleeping 1 - - - -  Tired, decreased energy 1 - - - -  Change in appetite 0 - - - -  Feeling bad or failure about yourself  0 - - - -  Trouble concentrating 0 - - - -  Moving slowly or fidgety/restless 0 - - - -  Suicidal thoughts 0 - - - -  PHQ-9 Score 2 - - - -  Difficult doing work/chores Not difficult at all - - - -   Interpretation of Total Score  Total Score Depression Severity:  1-4 = Minimal depression, 5-9 = Mild depression, 10-14 = Moderate depression, 15-19 = Moderately severe depression, 20-27 = Severe depression   Psychosocial Evaluation and Intervention:  Psychosocial Evaluation - 04/23/23 1506       Psychosocial Evaluation & Interventions   Interventions Encouraged to exercise with the program and follow exercise prescription    Comments Mr. Hanko is 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 He has had a long recovery related to balance and walking. He has spent 22 days in a rehab facility and completed [facility] rehab. He is hesitant about the program because he is unsure what more rehab will do for him. We discussed how this is a more independent program that will help him strengthen his balance and regain muscle. He wants to work on his independence so he can maintain it.    Expected Outcomes Short: attend cardiac rehab for education and  exercise. Long: develop and maintain positive self care habits    Continue Psychosocial Services  Follow up required by staff             Psychosocial Re-Evaluation:   Psychosocial Discharge (Final Psychosocial Re-Evaluation):   Vocational Rehabilitation: Provide vocational rehab assistance to qualifying candidates.   Vocational Rehab Evaluation & Intervention:  Vocational Rehab - 04/23/23 1502       Initial Vocational Rehab Evaluation & Intervention   Assessment shows need for Vocational Rehabilitation No             Education: Education Goals: Education classes will be provided on a variety of topics geared toward better understanding of heart health and risk factor modification. Participant will state understanding/return demonstration of topics presented as noted by education test scores.  Learning Barriers/Preferences:  Learning Barriers/Preferences - 04/23/23 1502       Learning Barriers/Preferences   Learning Barriers None    Learning Preferences Individual Instruction             General Cardiac Education Topics:  AED/CPR: - Group verbal and written instruction with the use of models to demonstrate the basic use of the AED with the basic ABC's of resuscitation.   Anatomy and Cardiac Procedures: - Group verbal and visual presentation and models provide information about basic cardiac anatomy and function. Reviews the testing methods done to diagnose heart disease and the outcomes of the test results. Describes the treatment choices: Medical Management, Angioplasty, or Coronary Bypass Surgery for treating various heart conditions including Myocardial Infarction, Angina, Valve Disease, and Cardiac Arrhythmias.  Written material given at graduation.   Medication Safety: - Group verbal and visual instruction to review commonly prescribed medications for heart and lung disease. Reviews the medication, class of the drug, and side effects. Includes the steps  to properly store meds and maintain the prescription regimen.  Written material given at graduation.   Intimacy: - Group verbal instruction through game format to discuss how heart and lung disease can affect sexual intimacy. Written material given at graduation..   Know Your Numbers and Heart Failure: - Group verbal and visual instruction to discuss disease risk factors for cardiac and pulmonary disease and treatment options.  Reviews associated critical values for Overweight/Obesity, Hypertension, Cholesterol, and Diabetes.  Discusses basics of heart failure: signs/symptoms and treatments.  Introduces Heart Failure Zone chart for action plan for heart failure.  Written material given at graduation.   Infection Prevention: - Provides verbal and written material to individual with discussion of infection control including proper hand washing and proper equipment cleaning during exercise session. Flowsheet Row Cardiac Rehab from 04/27/2023 in [facility] Cardiac and Pulmonary Rehab  Date 04/27/23  Educator [facility]  Instruction Review Code 1- [employer]  Falls Prevention: - Provides verbal and written material to individual with discussion of falls prevention and safety. Flowsheet Row Cardiac Rehab from 04/27/2023 in [facility] Cardiac and Pulmonary Rehab  Date 04/27/23  Educator [facility]  Instruction Review Code 1- Verbalizes Understanding       Other: -Provides group and verbal instruction on various topics (see comments)   Knowledge Questionnaire Score:   Core Components/Risk Factors/Patient Goals at Admission:  Personal Goals and Risk Factors at Admission - 04/27/23 1548       Core Components/Risk Factors/Patient Goals on Admission    Weight Management Yes;Weight Maintenance    Intervention Weight Management: Develop a combined nutrition and exercise program designed to reach desired caloric intake, while maintaining appropriate intake of nutrient and fiber, sodium and fats,  and appropriate energy expenditure required for the weight goal.;Weight Management: Provide education and appropriate resources to help participant work on and attain dietary goals.;Weight Management/Obesity: Establish reasonable short term and long term weight goals.    Admit Weight 209 lb 12.8 oz (95.2 kg)    Goal Weight: Short Term 210 lb (95.3 kg)    Goal Weight: Long Term 210 lb (95.3 kg)    Expected Outcomes Short Term: Continue to assess and modify interventions until short term weight is achieved;Long Term: Adherence to nutrition and physical activity/exercise program aimed toward attainment of established weight goal;Weight Maintenance: Understanding of the daily nutrition guidelines, which includes 25-35% calories from fat, 7% or less cal from saturated fats, less than 200mg  cholesterol, less than 1.5gm of sodium, & 5 or more servings of fruits and vegetables daily;Understanding recommendations for meals to include 15-35% energy as protein, 25-35% energy from fat, 35-60% energy from carbohydrates, less than 200mg  of dietary cholesterol, 20-35 gm of total fiber daily;Understanding of distribution of calorie intake throughout the day with the consumption of 4-5 meals/snacks    Diabetes Yes    Intervention Provide education about proper nutrition, including hydration, and aerobic/resistive exercise prescription along with prescribed medications to achieve blood glucose in normal ranges: Fasting glucose 65-99 mg/dL;Provide education about signs/symptoms and action to take for hypo/hyperglycemia.    Expected Outcomes Short Term: Participant verbalizes understanding of the signs/symptoms and immediate care of hyper/hypoglycemia, proper foot care and importance of medication, aerobic/resistive exercise and nutrition plan for blood glucose control.;Long Term: Attainment of HbA1C < 7%.    Hypertension Yes    Intervention Provide education on lifestyle modifcations including regular physical  activity/exercise, weight management, moderate sodium restriction and increased consumption of fresh fruit, vegetables, and low fat dairy, alcohol moderation, and smoking cessation.;Monitor prescription use compliance.    Expected Outcomes Short Term: Continued assessment and intervention until BP is < 140/16mm HG in hypertensive participants. < 130/51mm HG in hypertensive participants with diabetes, heart failure or chronic kidney disease.;Long Term: Maintenance of blood pressure at goal levels.             Education:Diabetes - Individual verbal and written instruction to review signs/symptoms of diabetes, desired ranges of glucose level fasting, after meals and with exercise. Acknowledge that pre and post exercise glucose checks will be done for 3 sessions at entry of program. Flowsheet Row Cardiac Rehab from 04/27/2023 in [facility] Cardiac and Pulmonary Rehab  Date 04/27/23  [facility]ator [facility]  Instruction Review Code 1- Verbalizes Understanding       Core Components/Risk Factors/Patient Goals Review:    Core Components/Risk Factors/Patient Goals at Discharge (Final Review):    ITP Comments:  ITP Comments     Row Name 04/23/23  1500 04/27/23 1541 04/29/23 0944       ITP Comments Initial phone call completed. Diagnosis can be found in [facility] 12/4. EP Orientation scheduled for Monday 12/16 at 1:30. Completed and gym orientation. Initial ITP created and sent for review to Dr. Bethann Punches, Medical Director. 30 Day review completed. Medical Director ITP review done, changes made as directed, and signed approval by Medical Director.    new to program              Comments:

## 2023-05-04 ENCOUNTER — Encounter: Payer: Medicare Other | Admitting: *Deleted

## 2023-05-04 DIAGNOSIS — Z951 Presence of aortocoronary bypass graft: Secondary | ICD-10-CM | POA: Diagnosis not present

## 2023-05-04 DIAGNOSIS — Z48812 Encounter for surgical aftercare following surgery on the circulatory system: Secondary | ICD-10-CM | POA: Diagnosis not present

## 2023-05-04 DIAGNOSIS — I252 Old myocardial infarction: Secondary | ICD-10-CM | POA: Diagnosis not present

## 2023-05-04 DIAGNOSIS — I213 ST elevation (STEMI) myocardial infarction of unspecified site: Secondary | ICD-10-CM

## 2023-05-04 LAB — GLUCOSE, CAPILLARY
Glucose-Capillary: 146 mg/dL — ABNORMAL HIGH (ref 70–99)
Glucose-Capillary: 133 mg/dL — ABNORMAL HIGH (ref 70–99)

## 2023-05-04 NOTE — Progress Notes (Signed)
Daily Session Note  Patient Details  Name: Jon [name] MRN: [id number] Date of Birth: Jan 25, 1946 Referring Provider:   Flowsheet Row Cardiac Rehab from 04/27/2023 in [facility] Cardiac and Pulmonary Rehab  Referring Provider Dr. [name]       Encounter Date: 05/04/2023  Check In:  Session Check In - 05/04/23 1334       Check-In   Supervising physician immediately available to respond to emergencies See telemetry face sheet for immediately available ER MD    Location [facility]-Cardiac & Pulmonary Rehab    Staff Present [name], RN, BSN, CCRP;Kristen Coble, RN,BC,MSN;Margaret Best, MS, Exercise Physiologist;Kelly Madilyn Fireman, BS, ACSM CEP, Exercise Physiologist;Megan Katrinka Blazing, RN, ADN    Virtual Visit No    Medication changes reported     No    Fall or balance concerns reported    No    Warm-up and Cool-down Performed on first and last piece of equipment    Resistance Training Performed Yes    VAD Patient? No      Pain Assessment   Currently in Pain? No/denies                Social History   Tobacco Use  Smoking Status Former   Current packs/day: 0.00   Average packs/day: 1 pack/day for 62.0 years (62.0 ttl pk-yrs)   Types: Cigarettes   Start date: 01/13/1960   Quit date: 01/12/2022   Years since quitting: 1.3  Smokeless Tobacco Never    Goals Met:  Exercise tolerated well Personal goals reviewed No report of concerns or symptoms today  Goals Unmet:  Not Applicable  Comments: Pt able to follow exercise prescription today without complaint.  Will continue to monitor for progression. First full day of exercise!  Patient was oriented to gym and equipment including functions, settings, policies, and procedures.  Patient's individual exercise prescription and treatment plan were reviewed.  All starting workloads were established based on the results of the 6 minute walk test done at initial orientation visit.  The plan for exercise progression was also introduced and  progression will be customized based on patient's performance and goals.    Dr. Bethann Punches is Medical Director for [facility] Cardiac Rehabilitation.  Dr. Vida Rigger is Medical Director for [facility] Pulmonary Rehabilitation.

## 2023-05-08 ENCOUNTER — Encounter: Payer: Medicare Other | Admitting: *Deleted

## 2023-05-08 DIAGNOSIS — Z951 Presence of aortocoronary bypass graft: Secondary | ICD-10-CM

## 2023-05-08 DIAGNOSIS — I252 Old myocardial infarction: Secondary | ICD-10-CM | POA: Diagnosis not present

## 2023-05-08 DIAGNOSIS — Z48812 Encounter for surgical aftercare following surgery on the circulatory system: Secondary | ICD-10-CM | POA: Diagnosis not present

## 2023-05-08 DIAGNOSIS — I213 ST elevation (STEMI) myocardial infarction of unspecified site: Secondary | ICD-10-CM

## 2023-05-08 LAB — GLUCOSE, CAPILLARY
Glucose-Capillary: 185 mg/dL — ABNORMAL HIGH (ref 70–99)
Glucose-Capillary: 194 mg/dL — ABNORMAL HIGH (ref 70–99)

## 2023-05-08 NOTE — Progress Notes (Signed)
Daily Session Note  Patient Details  Name: Jon [name] MRN: [id number] Date of Birth: [birth year] Referring Provider:   Flowsheet Row Cardiac Rehab from 04/27/2023 in [facility] Cardiac and Pulmonary Rehab  Referring Provider Dr. [name]       Encounter Date: 05/08/2023  Check In:  Session Check In - 05/08/23 1115       Check-In   Supervising physician immediately available to respond to emergencies See telemetry face sheet for immediately available ER MD    Location [facility]-Cardiac & Pulmonary Rehab    Staff Present [name], RN, BSN, CCRP;Joseph Hood, RCP,RRT,BSRT;Noah Tickle, [place], Exercise Physiologist    Virtual Visit No    Medication changes reported     No    Fall or balance concerns reported    No    Warm-up and Cool-down Performed on first and last piece of equipment    Resistance Training Performed Yes    VAD Patient? No    PAD/SET Patient? No      Pain Assessment   Currently in Pain? No/denies                Social History   Tobacco Use  Smoking Status Former   Current packs/day: 0.00   Average packs/day: 1 pack/day for 62.0 years (62.0 ttl pk-yrs)   Types: Cigarettes   Start date: 01/13/1960   Quit date: 01/12/2022   Years since quitting: 1.3  Smokeless Tobacco Never    Goals Met:  Independence with exercise equipment Exercise tolerated well No report of concerns or symptoms today  Goals Unmet:  Not Applicable  Comments: Pt able to follow exercise prescription today without complaint.  Will continue to monitor for progression.    Dr. Bethann Punches is Medical Director for [facility] Cardiac Rehabilitation.  Dr. Vida Rigger is Medical Director for [facility] Pulmonary Rehabilitation.

## 2023-05-11 ENCOUNTER — Encounter: Payer: Medicare Other | Admitting: *Deleted

## 2023-05-11 DIAGNOSIS — Z951 Presence of aortocoronary bypass graft: Secondary | ICD-10-CM

## 2023-05-11 DIAGNOSIS — Z48812 Encounter for surgical aftercare following surgery on the circulatory system: Secondary | ICD-10-CM | POA: Diagnosis not present

## 2023-05-11 DIAGNOSIS — I252 Old myocardial infarction: Secondary | ICD-10-CM | POA: Diagnosis not present

## 2023-05-11 LAB — GLUCOSE, CAPILLARY
Glucose-Capillary: 207 mg/dL — ABNORMAL HIGH (ref 70–99)
Glucose-Capillary: 208 mg/dL — ABNORMAL HIGH (ref 70–99)

## 2023-05-11 NOTE — Progress Notes (Signed)
Daily Session Note  Patient Details  Name: Jon [name] MRN: [id number] Date of Birth: April 20, 1946 Referring Provider:   Flowsheet Row Cardiac Rehab from 04/27/2023 in [facility] Cardiac and Pulmonary Rehab  Referring Provider Dr. [name]       Encounter Date: 05/11/2023  Check In:  Session Check In - 05/11/23 1115       Check-In   Supervising physician immediately available to respond to emergencies See telemetry face sheet for immediately available ER MD    Location [facility]-Cardiac & Pulmonary Rehab    Staff Present Ronette Deter, BS, Exercise Physiologist;Susanne Bice, RN, BSN, CCRP;Faiga Stones Katrinka Blazing, RN, ADN;Meredith Jewel Baize, RN BSN    Virtual Visit No    Medication changes reported     No    Fall or balance concerns reported    No    Warm-up and Cool-down Performed on first and last piece of equipment    Resistance Training Performed Yes    VAD Patient? No    PAD/SET Patient? No      Pain Assessment   Currently in Pain? No/denies                Social History   Tobacco Use  Smoking Status Former   Current packs/day: 0.00   Average packs/day: 1 pack/day for 62.0 years (62.0 ttl pk-yrs)   Types: Cigarettes   Start date: 01/13/1960   Quit date: 01/12/2022   Years since quitting: 1.3  Smokeless Tobacco Never    Goals Met:  Independence with exercise equipment Exercise tolerated well No report of concerns or symptoms today Strength training completed today  Goals Unmet:  Not Applicable  Comments: Pt able to follow exercise prescription today without complaint.  Will continue to monitor for progression.    Dr. Bethann Punches is Medical Director for [facility] Cardiac Rehabilitation.  Dr. Vida Rigger is Medical Director for [facility] Pulmonary Rehabilitation.

## 2023-05-15 ENCOUNTER — Encounter: Payer: Medicare Other | Attending: Cardiology

## 2023-05-15 DIAGNOSIS — Z48812 Encounter for surgical aftercare following surgery on the circulatory system: Secondary | ICD-10-CM | POA: Insufficient documentation

## 2023-05-15 DIAGNOSIS — Z951 Presence of aortocoronary bypass graft: Secondary | ICD-10-CM | POA: Insufficient documentation

## 2023-05-15 DIAGNOSIS — I252 Old myocardial infarction: Secondary | ICD-10-CM | POA: Insufficient documentation

## 2023-05-17 ENCOUNTER — Other Ambulatory Visit: Payer: Self-pay | Admitting: Family Medicine

## 2023-05-18 ENCOUNTER — Encounter: Payer: Medicare Other | Admitting: *Deleted

## 2023-05-20 ENCOUNTER — Encounter: Payer: Medicare Other | Admitting: *Deleted

## 2023-05-27 ENCOUNTER — Encounter: Payer: Self-pay | Admitting: *Deleted

## 2023-05-27 DIAGNOSIS — I213 ST elevation (STEMI) myocardial infarction of unspecified site: Secondary | ICD-10-CM

## 2023-05-27 DIAGNOSIS — Z951 Presence of aortocoronary bypass graft: Secondary | ICD-10-CM

## 2023-05-27 NOTE — Progress Notes (Signed)
 Cardiac Individual Treatment Plan  Patient Details  Name: Jon [name] MRN: [id number] Date of Birth: 11-Nov-1945 Referring Provider:   Flowsheet Row Cardiac Rehab from 04/27/2023 in [facility] Cardiac and Pulmonary Rehab  Referring Provider Dr. Randene Bustard       Initial Encounter Date:  Flowsheet Row Cardiac Rehab from 04/27/2023 in [facility] Cardiac and Pulmonary Rehab  Date 04/27/23       Visit Diagnosis: S/P CABG x 3  ST elevation myocardial infarction (STEMI), unspecified artery (HCC)  Patient's Home Medications on Admission:  Current Outpatient Medications:    Accu-Chek Softclix Lancets lancets, Use to check blood sugars twice daily, Disp: 300 each, Rfl: 3   aspirin  EC 81 MG tablet, Take 1 tablet (81 mg total) by mouth daily. Swallow whole., Disp: , Rfl:    atorvastatin  (LIPITOR ) 80 MG tablet, TAKE 1 TABLET BY MOUTH AT  BEDTIME, Disp: 100 tablet, Rfl: 2   Blood Glucose Monitoring Suppl (ACCU-CHEK GUIDE ME) w/Device KIT, Check glucose three times daily. Dx. Code E11.8, Disp: 1 kit, Rfl: 3   clopidogrel  (PLAVIX ) 75 MG tablet, Take 1 tablet (75 mg total) by mouth daily., Disp: 90 tablet, Rfl: 3   glucose blood (ACCU-CHEK GUIDE) test strip, Dx DM E11.9. Check fasting blood sugar every morning., Disp: 300 each, Rfl: PRN   insulin  degludec (TRESIBA  FLEXTOUCH) 100 UNIT/ML FlexTouch Pen, Inject 20 Units into the skin daily. (Patient taking differently: Inject 20 Units into the skin daily as needed (high blood sugar per patient).), Disp: 1 mL, Rfl: 0   metoprolol  succinate (TOPROL -XL) 25 MG 24 hr tablet, Take 1 tablet (25 mg total) by mouth daily., Disp: 90 tablet, Rfl: 3   Semaglutide ,0.25 or 0.5MG /DOS, 2 MG/3ML SOPN, Inject 0.5 mg into the skin every 7 (seven) days., Disp: , Rfl:    SYNJARDY  12.5-500 MG TABS, TAKE 1 TABLET BY MOUTH TWICE  DAILY, Disp: 180 tablet, Rfl: 3   traZODone  (DESYREL ) 50 MG tablet, Take 1 tablet (50 mg total) by mouth at bedtime as needed for sleep., Disp: , Rfl:    Past Medical History: Past Medical History:  Diagnosis Date   BENIGN POSITIONAL VERTIGO 05/20/2010   Qualifier: Diagnosis of  By: Greer Leak MD, Catherine     Centrilobular emphysema [facility])    Cerebrovascular disease 07/15/2018   Prior CVA & TIA. Carotid CTA ~50% Bilateral ICA,  Multifocal severe stenosis of the left V2 vertebral artery.  Right vertebral artery patent without significant stenosis.   Coronary artery disease involving native coronary artery with angina pectoris (HCC) 11/18/2017   Severe LM, LAD & RCA stenosis indicated on Coronary CTA => CaCardiac Cath 01/02/2022: dLM ~50%; mLAD 85%@D1  90% (1,1,1) - m-dLAD 90$ @ 65% D2 (0,1,1), RI 75%, pLCx 30%-OM1 60%, pRCA 25% - p-mRCA hazy 85%. Normal LVEF by Echo, EDP 16 mmHg. = Best option CABGrdiac Cath    Current every day smoker    No interest in quitting   Diabetes mellitus, type II, insulin  dependent (HCC)    With peripheral neuropathy, peripheral vascular disease and coronary artery disease as complications.   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 => 12/17/2021: L Subclavia A PTA=Stent with resolution of Sx.   TIA (transient ischemic attack) 11/11/2021   Brief episode of left sided arm pain  and slurred speech    Tobacco Use: Social History   Tobacco Use  Smoking Status Former   Current packs/day: 0.00   Average packs/day: 1 pack/day for 62.0 years (62.0 ttl pk-yrs)   Types: Cigarettes   Start date: 01/13/1960   Quit date: 01/12/2022   Years since quitting: 1.3  Smokeless Tobacco Never    Labs: Review Flowsheet  More data exists      Latest Ref Rng & Units 05/15/2022 12/04/2022 12/11/2022 01/30/2023 04/20/2023  Labs for ITP Cardiac and Pulmonary Rehab  Cholestrol 0 - 200 mg/dL - - 161  - -  LDL  (calc) 0 - 99 mg/dL - - 51  - -  HDL-C >09 mg/dL - - 37  - -  Trlycerides <150 mg/dL - - 604  - -  Hemoglobin A1c 4.0 - 5.6 % 8.7  7.8  - - 6.3   PH, Arterial 7.35 - 7.45 - - 7.390  7.331  7.351  7.391  7.372  7.388  7.319  7.310  -  PCO2 arterial 32 - 48 mmHg - - 35.4  31.8  34.8  32.3  35.2  32.8  41.0  38.9  -  Bicarbonate 20.0 - 28.0 mmol/L - - 21.4  16.9  19.4  19.8  20.5  19.8  26.4  21.1  19.6  -  TCO2 22 - 32 mmol/L - - 22  18  20  21  22  25  21  24  23  28  22  21  21  23   -  Acid-base deficit 0.0 - 2.0 mmol/L - - 3.0  8.0  6.0  5.0  4.0  5.0  5.0  6.0  -  O2 Saturation % - - 95  99  99  99  100  100  83  100  100  -    Details       Multiple values from one day are sorted in reverse-chronological order          Exercise Target Goals: Exercise Program Goal: Individual exercise prescription set using results from initial 6 min walk test and THRR while considering  patient's activity barriers and safety.   Exercise Prescription Goal: Initial exercise prescription builds to 30-45 minutes a day of aerobic activity, 2-3 days per week.  Home exercise guidelines will be given to patient during program as part of exercise prescription that the participant will acknowledge.   Education: Aerobic Exercise: - Group verbal and visual presentation on the components of exercise prescription. Introduces F.I.T.T principle from ACSM for exercise prescriptions.  Reviews F.I.T.T. principles of aerobic exercise including progression. Written material given at graduation.   Education: Resistance Exercise: - Group verbal and visual presentation on the components of exercise prescription. Introduces F.I.T.T principle from ACSM for exercise prescriptions  Reviews F.I.T.T. principles of resistance exercise including progression. Written material given at graduation.    Education: Exercise & Equipment Safety: - Individual verbal instruction and demonstration of equipment use and safety with use  of the equipment. Flowsheet Row Cardiac Rehab from 05/04/2023 in [facility] Cardiac and Pulmonary Rehab  Date 04/27/23  Educator [facility]  Instruction Review Code 1- Verbalizes Understanding       Education: Exercise Physiology & General Exercise Guidelines: - Group verbal and written instruction with models to review the exercise physiology of the cardiovascular system and associated critical values. Provides general exercise guidelines with specific guidelines to those with heart or lung disease.  Flowsheet Row Cardiac Rehab from  05/04/2023 in [facility] Cardiac and Pulmonary Rehab  Date 05/04/23  Educator [facility]  Instruction Review Code 1- [employer] Understanding       Education: Flexibility, Balance, Mind/Body Relaxation: - Group verbal and visual presentation with interactive activity on the components of exercise prescription. Introduces F.I.T.T principle from ACSM for exercise prescriptions. Reviews F.I.T.T. principles of flexibility and balance exercise training including progression. Also discusses the mind body connection.  Reviews various relaxation techniques to help reduce and manage stress (i.e. Deep breathing, progressive muscle relaxation, and visualization). Balance handout provided to take home. Written material given at graduation.   Activity Barriers & Risk Stratification:  Activity Barriers & Cardiac Risk Stratification - 04/27/23 1543       Activity Barriers & Cardiac Risk Stratification   Activity Barriers Balance Concerns;Back Problems    Cardiac Risk Stratification High             6 Minute Walk:  6 Minute Walk     Row Name 04/27/23 1541         6 Minute Walk   Phase Initial     Distance 765 feet     Walk Time 6 minutes     # of Rest Breaks 0     MPH 1.45     METS 1.49     RPE 12     Perceived Dyspnea  1     VO2 Peak 5.2     Symptoms No     Resting HR 71 bpm     Resting BP 124/78     Resting Oxygen Saturation  98 %     Exercise Oxygen Saturation  during 6  min walk 99 %     Max Ex. HR 83 bpm     Max Ex. BP 120/82     2 Minute Post BP 122/82              Oxygen Initial Assessment:   Oxygen Re-Evaluation:   Oxygen Discharge (Final Oxygen Re-Evaluation):   Initial Exercise Prescription:  Initial Exercise Prescription - 04/27/23 1500       Date of Initial Exercise RX and Referring Provider   Date 04/27/23    Referring Provider Dr. Randene Bustard      Oxygen   Maintain Oxygen Saturation 88% or higher      Treadmill   MPH 0.8    Grade 0    Minutes 15    METs 1.6      NuStep   Level 1    SPM 80    Minutes 15    METs 1.49      Arm Ergometer   Level 1    RPM 30    Minutes 15    METs 1.49      Track   Laps 10    Minutes 15    METs 1.54      Prescription Details   Frequency (times per week) 2    Duration Progress to 30 minutes of continuous aerobic without signs/symptoms of physical distress      Intensity   THRR 40-80% of Max Heartrate 100-129    Ratings of Perceived Exertion 11-13    Perceived Dyspnea 0-4      Progression   Progression Continue to progress workloads to maintain intensity without signs/symptoms of physical distress.      Resistance Training   Training Prescription Yes    Weight 4    Reps 10-15  Perform Capillary Blood Glucose checks as needed.  Exercise Prescription Changes:   Exercise Prescription Changes     Row Name 04/27/23 1500 05/21/23 1400           Response to Exercise   Blood Pressure (Admit) 124/78 112/68      Blood Pressure (Exercise) 120/82 108/58      Blood Pressure (Exit) 122/82 112/56      Heart Rate (Admit) 71 bpm 72 bpm      Heart Rate (Exercise) 83 bpm 98 bpm      Heart Rate (Exit) 71 bpm 70 bpm      Oxygen Saturation (Admit) 98 % --      Oxygen Saturation (Exercise) 99 % --      Oxygen Saturation (Exit) 99 % --      Rating of Perceived Exertion (Exercise) 12 11      Perceived Dyspnea (Exercise) 1 0      Symptoms none none       Comments 6 MWT resutls first two weeks of exercise      Duration -- Progress to 30 minutes of  aerobic without signs/symptoms of physical distress      Intensity -- THRR unchanged        Progression   Progression -- Continue to progress workloads to maintain intensity without signs/symptoms of physical distress.      Average METs -- 1.9        Resistance Training   Training Prescription -- Yes      Weight -- 4      Reps -- 10-15        Interval Training   Interval Training -- No        NuStep   Level -- 3      Minutes -- 15      METs -- 1.9        Oxygen   Maintain Oxygen Saturation -- 88% or higher               Exercise Comments:   Exercise Comments     Row Name 05/04/23 1335           Exercise Comments First full day of exercise!  Patient was oriented to gym and equipment including functions, settings, policies, and procedures.  Patient's individual exercise prescription and treatment plan were reviewed.  All starting workloads were established based on the results of the 6 minute walk test done at initial orientation visit.  The plan for exercise progression was also introduced and progression will be customized based on patient's performance and goals.                Exercise Goals and Review:   Exercise Goals     Row Name 04/27/23 1550             Exercise Goals   Increase Physical Activity Yes       Intervention Provide advice, education, support and counseling about physical activity/exercise needs.;Develop an individualized exercise prescription for aerobic and resistive training based on initial evaluation findings, risk stratification, comorbidities and participant's personal goals.       Expected Outcomes Short Term: Attend rehab on a regular basis to increase amount of physical activity.;Long Term: Add in home exercise to make exercise part of routine and to increase amount of physical activity.;Long Term: Exercising regularly at least 3-5 days  a week.       Increase Strength and Stamina Yes       Intervention  Provide advice, education, support and counseling about physical activity/exercise needs.;Develop an individualized exercise prescription for aerobic and resistive training based on initial evaluation findings, risk stratification, comorbidities and participant's personal goals.       Expected Outcomes Short Term: Increase workloads from initial exercise prescription for resistance, speed, and METs.;Short Term: Perform resistance training exercises routinely during rehab and add in resistance training at home;Long Term: Improve cardiorespiratory fitness, muscular endurance and strength as measured by increased METs and functional capacity ( )       Able to understand and use rate of perceived exertion (RPE) scale Yes       Intervention Provide education and explanation on how to use RPE scale       Expected Outcomes Short Term: Able to use RPE daily in rehab to express subjective intensity level;Long Term:  Able to use RPE to guide intensity level when exercising independently       Able to understand and use Dyspnea scale Yes       Intervention Provide education and explanation on how to use Dyspnea scale       Expected Outcomes Short Term: Able to use Dyspnea scale daily in rehab to express subjective sense of shortness of breath during exertion;Long Term: Able to use Dyspnea scale to guide intensity level when exercising independently       Knowledge and understanding of Target Heart Rate Range (THRR) Yes       Intervention Provide education and explanation of THRR including how the numbers were predicted and where they are located for reference       Expected Outcomes Short Term: Able to state/look up THRR;Long Term: Able to use THRR to govern intensity when exercising independently;Short Term: Able to use daily as guideline for intensity in rehab       Able to check pulse independently Yes       Intervention Provide education  and demonstration on how to check pulse in carotid and radial arteries.;Review the importance of being able to check your own pulse for safety during independent exercise       Expected Outcomes Short Term: Able to explain why pulse checking is important during independent exercise;Long Term: Able to check pulse independently and accurately       Understanding of Exercise Prescription Yes       Intervention Provide education, explanation, and written materials on patient's individual exercise prescription       Expected Outcomes Short Term: Able to explain program exercise prescription;Long Term: Able to explain home exercise prescription to exercise independently                Exercise Goals Re-Evaluation :  Exercise Goals Re-Evaluation     Row Name 05/04/23 1335 05/21/23 1429           Exercise Goal Re-Evaluation   Exercise Goals Review Able to understand and use rate of perceived exertion (RPE) scale;Knowledge and understanding of Target Heart Rate Range (THRR);Able to understand and use Dyspnea scale;Understanding of Exercise Prescription Increase Physical Activity;Increase Strength and Stamina;Understanding of Exercise Prescription      Comments Reviewed RPE and dyspnea scale, THR and program prescription with pt today.  Pt voiced understanding and was given a copy of goals to take home. Gwinda Leopard is off to a great start in the program. He was only able to attend one session during this review, but he was able to increase his level on the T4 nustep from 1 to 3. We will continue to monitor  his progress in the program.      Expected Outcomes Short: Use RPE daily to regulate intensity. Long: Follow program prescription in THR. Short: Continue to follow current exercise prescription. Long: Continue exercise to improve strength and stamina.               Discharge Exercise Prescription (Final Exercise Prescription Changes):  Exercise Prescription Changes - 05/21/23 1400        Response to Exercise   Blood Pressure (Admit) 112/68    Blood Pressure (Exercise) 108/58    Blood Pressure (Exit) 112/56    Heart Rate (Admit) 72 bpm    Heart Rate (Exercise) 98 bpm    Heart Rate (Exit) 70 bpm    Rating of Perceived Exertion (Exercise) 11    Perceived Dyspnea (Exercise) 0    Symptoms none    Comments first two weeks of exercise    Duration Progress to 30 minutes of  aerobic without signs/symptoms of physical distress    Intensity THRR unchanged      Progression   Progression Continue to progress workloads to maintain intensity without signs/symptoms of physical distress.    Average METs 1.9      Resistance Training   Training Prescription Yes    Weight 4    Reps 10-15      Interval Training   Interval Training No      NuStep   Level 3    Minutes 15    METs 1.9      Oxygen   Maintain Oxygen Saturation 88% or higher             Nutrition:  Target Goals: Understanding of nutrition guidelines, daily intake of sodium 1500mg , cholesterol 200mg , calories 30% from fat and 7% or less from saturated fats, daily to have 5 or more servings of fruits and vegetables.  Education: All About Nutrition: -Group instruction provided by verbal, written material, interactive activities, discussions, models, and posters to present general guidelines for heart healthy nutrition including fat, fiber, MyPlate, the role of sodium in heart healthy nutrition, utilization of the nutrition label, and utilization of this knowledge for meal planning. Follow up email sent as well. Written material given at graduation.   Biometrics:  Pre Biometrics - 04/27/23 1546       Pre Biometrics   Height 6\' 1"  (1.854 m)    Weight 209 lb 12.8 oz (95.2 kg)    Waist Circumference 43.5 inches    Hip Circumference 41 inches    Waist to Hip Ratio 1.06 %    BMI (Calculated) 27.69    Single Leg Stand 2.12 seconds              Nutrition Therapy Plan and Nutrition Goals:  Nutrition  Therapy & Goals - 04/27/23 1547       Nutrition Therapy   RD appointment deferred Yes             Nutrition Assessments:  MEDIFICTS Score Key: >=70 Need to make dietary changes  40-70 Heart Healthy Diet <= 40 Therapeutic Level Cholesterol Diet  Flowsheet Row Cardiac Rehab from 05/04/2023 in [facility] Cardiac and Pulmonary Rehab  Picture Your Plate Total Score on Admission 44      Picture Your Plate Scores: <04 Unhealthy dietary pattern with much room for improvement. 41-50 Dietary pattern unlikely to meet recommendations for good health and room for improvement. 51-60 More healthful dietary pattern, with some room for improvement.  >60 Healthy dietary pattern, although there may  be some specific behaviors that could be improved.    Nutrition Goals Re-Evaluation:   Nutrition Goals Discharge (Final Nutrition Goals Re-Evaluation):   Psychosocial: Target Goals: Acknowledge presence or absence of significant depression and/or stress, maximize coping skills, provide positive support system. Participant is able to verbalize types and ability to use techniques and skills needed for reducing stress and depression.   Education: Stress, Anxiety, and Depression - Group verbal and visual presentation to define topics covered.  Reviews how body is impacted by stress, anxiety, and depression.  Also discusses healthy ways to reduce stress and to treat/manage anxiety and depression.  Written material given at graduation.   Education: Sleep Hygiene -Provides group verbal and written instruction about how sleep can affect your health.  Define sleep hygiene, discuss sleep cycles and impact of sleep habits. Review good sleep hygiene tips.    Initial Review & Psychosocial Screening:  Initial Psych Review & Screening - 04/23/23 1502       Initial Review   Current issues with Current Stress Concerns    Source of Stress Concerns Unable to participate in former interests or hobbies;Unable to  perform yard/household activities      Family Dynamics   Good Support System? Yes      Barriers   Psychosocial barriers to participate in program There are no identifiable barriers or psychosocial needs.;The patient should benefit from training in stress management and relaxation.      Screening Interventions   Interventions Encouraged to exercise;Provide feedback about the scores to participant;To provide support and resources with identified psychosocial needs    Expected Outcomes Short Term goal: Utilizing psychosocial counselor, staff and physician to assist with identification of specific Stressors or current issues interfering with healing process. Setting desired goal for each stressor or current issue identified.;Long Term Goal: Stressors or current issues are controlled or eliminated.;Short Term goal: Identification and review with participant of any Quality of Life or Depression concerns found by scoring the questionnaire.;Long Term goal: The participant improves quality of Life and PHQ9 Scores as seen by post scores and/or verbalization of changes             Quality of Life Scores:   Quality of Life - 05/04/23 1422       Quality of Life   Select Quality of Life      Quality of Life Scores   Health/Function Pre 23.09 %    Socioeconomic Pre 22.32 %    Psych/Spiritual Pre 23.5 %    Family Pre 21.07 %    GLOBAL Pre 28.5 %            Scores of 19 and below usually indicate a poorer quality of life in these areas.  A difference of  2-3 points is a clinically meaningful difference.  A difference of 2-3 points in the total score of the Quality of Life Index has been associated with significant improvement in overall quality of life, self-image, physical symptoms, and general health in studies assessing change in quality of life.  PHQ-9: Review Flowsheet  More data exists      04/27/2023 12/04/2022 08/01/2022 01/20/2022 07/26/2021  Depression screen PHQ 2/9  Decreased  Interest 0 0 0 0 0  Down, Depressed, Hopeless 0 0 0 0 0  PHQ - 2 Score 0 0 0 0 0  Altered sleeping 1 - - - -  Tired, decreased energy 1 - - - -  Change in appetite 0 - - - -  Feeling bad  or failure about yourself  0 - - - -  Trouble concentrating 0 - - - -  Moving slowly or fidgety/restless 0 - - - -  Suicidal thoughts 0 - - - -  PHQ-9 Score 2 - - - -  Difficult doing work/chores Not difficult at all - - - -   Interpretation of Total Score  Total Score Depression Severity:  1-4 = Minimal depression, 5-9 = Mild depression, 10-14 = Moderate depression, 15-19 = Moderately severe depression, 20-27 = Severe depression   Psychosocial Evaluation and Intervention:  Psychosocial Evaluation - 04/23/23 1506       Psychosocial Evaluation & Interventions   Interventions Encouraged to exercise with the program and follow exercise prescription    Comments Mr. Burget is 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 He has had a long recovery related to balance and walking. He has spent 22 days in a rehab facility and completed [facility] rehab. He is hesitant about the program because he is unsure what more rehab will do for him. We discussed how this is a more independent program that will help him strengthen his balance and regain muscle. He wants to work on his independence so he can maintain it.    Expected Outcomes Short: attend cardiac rehab for education and exercise. Long: develop and maintain positive self care habits    Continue Psychosocial Services  Follow up required by staff             Psychosocial Re-Evaluation:   Psychosocial Discharge (Final Psychosocial Re-Evaluation):   Vocational Rehabilitation: Provide vocational rehab assistance to qualifying candidates.   Vocational Rehab Evaluation & Intervention:  Vocational Rehab - 04/23/23 1502       Initial Vocational Rehab Evaluation & Intervention   Assessment shows need for Vocational Rehabilitation No              Education: Education Goals: Education classes will be provided on a variety of topics geared toward better understanding of heart health and risk factor modification. Participant will state understanding/return demonstration of topics presented as noted by education test scores.  Learning Barriers/Preferences:  Learning Barriers/Preferences - 04/23/23 1502       Learning Barriers/Preferences   Learning Barriers None    Learning Preferences Individual Instruction             General Cardiac Education Topics:  AED/CPR: - Group verbal and written instruction with the use of models to demonstrate the basic use of the AED with the basic ABC's of resuscitation.   Anatomy and Cardiac Procedures: - Group verbal and visual presentation and models provide information about basic cardiac anatomy and function. Reviews the testing methods done to diagnose heart disease and the outcomes of the test results. Describes the treatment choices: Medical Management, Angioplasty, or Coronary Bypass Surgery for treating various heart conditions including Myocardial Infarction, Angina, Valve Disease, and Cardiac Arrhythmias.  Written material given at graduation.   Medication Safety: - Group verbal and visual instruction to review commonly prescribed medications for heart and lung disease. Reviews the medication, class of the drug, and side effects. Includes the steps to properly store meds and maintain the prescription regimen.  Written material given at graduation.   Intimacy: - Group verbal instruction through game format to discuss how heart and lung disease can affect sexual intimacy. Written material given at graduation..   Know Your Numbers and Heart Failure: - Group verbal and visual instruction to discuss disease risk factors for cardiac and pulmonary  disease and treatment options.  Reviews associated critical values for Overweight/Obesity, Hypertension, Cholesterol, and Diabetes.   Discusses basics of heart failure: signs/symptoms and treatments.  Introduces Heart Failure Zone chart for action plan for heart failure.  Written material given at graduation.   Infection Prevention: - Provides verbal and written material to individual with discussion of infection control including proper hand washing and proper equipment cleaning during exercise session. Flowsheet Row Cardiac Rehab from 05/04/2023 in [facility] Cardiac and Pulmonary Rehab  Date 04/27/23  Educator [facility]  Instruction Review Code 1- Verbalizes Understanding       Falls Prevention: - Provides verbal and written material to individual with discussion of falls prevention and safety. Flowsheet Row Cardiac Rehab from 05/04/2023 in [facility] Cardiac and Pulmonary Rehab  Date 04/27/23  Educator [facility]  Instruction Review Code 1- Verbalizes Understanding       Other: -Provides group and verbal instruction on various topics (see comments)   Knowledge Questionnaire Score:  Knowledge Questionnaire Score - 05/04/23 1424       Knowledge Questionnaire Score   Pre Score 22/26nutrition, exercie   HF             Core Components/Risk Factors/Patient Goals at Admission:  Personal Goals and Risk Factors at Admission - 04/27/23 1548       Core Components/Risk Factors/Patient Goals on Admission    Weight Management Yes;Weight Maintenance    Intervention Weight Management: Develop a combined nutrition and exercise program designed to reach desired caloric intake, while maintaining appropriate intake of nutrient and fiber, sodium and fats, and appropriate energy expenditure required for the weight goal.;Weight Management: Provide education and appropriate resources to help participant work on and attain dietary goals.;Weight Management/Obesity: Establish reasonable short term and long term weight goals.    Admit Weight 209 lb 12.8 oz (95.2 kg)    Goal Weight: Short Term 210 lb (95.3 kg)    Goal Weight: Long Term 210 lb (95.3  kg)    Expected Outcomes Short Term: Continue to assess and modify interventions until short term weight is achieved;Long Term: Adherence to nutrition and physical activity/exercise program aimed toward attainment of established weight goal;Weight Maintenance: Understanding of the daily nutrition guidelines, which includes 25-35% calories from fat, 7% or less cal from saturated fats, less than 200mg  cholesterol, less than 1.5gm of sodium, & 5 or more servings of fruits and vegetables daily;Understanding recommendations for meals to include 15-35% energy as protein, 25-35% energy from fat, 35-60% energy from carbohydrates, less than 200mg  of dietary cholesterol, 20-35 gm of total fiber daily;Understanding of distribution of calorie intake throughout the day with the consumption of 4-5 meals/snacks    Diabetes Yes    Intervention Provide education about proper nutrition, including hydration, and aerobic/resistive exercise prescription along with prescribed medications to achieve blood glucose in normal ranges: Fasting glucose 65-99 mg/dL;Provide education about signs/symptoms and action to take for hypo/hyperglycemia.    Expected Outcomes Short Term: Participant verbalizes understanding of the signs/symptoms and immediate care of hyper/hypoglycemia, proper foot care and importance of medication, aerobic/resistive exercise and nutrition plan for blood glucose control.;Long Term: Attainment of HbA1C < 7%.    Hypertension Yes    Intervention Provide education on lifestyle modifcations including regular physical activity/exercise, weight management, moderate sodium restriction and increased consumption of fresh fruit, vegetables, and low fat dairy, alcohol moderation, and smoking cessation.;Monitor prescription use compliance.    Expected Outcomes Short Term: Continued assessment and intervention until BP is < 140/58mm HG in hypertensive participants. <  130/83mm HG in hypertensive participants with diabetes, heart  failure or chronic kidney disease.;Long Term: Maintenance of blood pressure at goal levels.             Education:Diabetes - Individual verbal and written instruction to review signs/symptoms of diabetes, desired ranges of glucose level fasting, after meals and with exercise. Acknowledge that pre and post exercise glucose checks will be done for 3 sessions at entry of program. Flowsheet Row Cardiac Rehab from 05/04/2023 in [facility] Cardiac and Pulmonary Rehab  Date 04/27/23  Educator [facility]  Instruction Review Code 1- Verbalizes Understanding       Core Components/Risk Factors/Patient Goals Review:    Core Components/Risk Factors/Patient Goals at Discharge (Final Review):    ITP Comments:  ITP Comments     Row Name 04/23/23 1500 04/27/23 1541 04/29/23 0944 05/04/23 1335 05/27/23 1251   ITP Comments Initial phone call completed. Diagnosis can be found in [facility] 12/4. EP Orientation scheduled for Monday 12/16 at 1:30. Completed and gym orientation. Initial ITP created and sent for review to Dr. Firman Hughes, Medical Director. 30 Day review completed. Medical Director ITP review done, changes made as directed, and signed approval by Medical Director.    new to program First full day of exercise!  Patient was oriented to gym and equipment including functions, settings, policies, and procedures.  Patient's individual exercise prescription and treatment plan were reviewed.  All starting workloads were established based on the results of the 6 minute walk test done at initial orientation visit.  The plan for exercise progression was also introduced and progression will be customized based on patient's performance and goals. 30 Day review completed. Medical Director ITP review done, changes made as directed, and signed approval by Medical Director.    new to program            Comments:

## 2023-06-08 ENCOUNTER — Encounter: Payer: Medicare Other | Admitting: *Deleted

## 2023-06-08 DIAGNOSIS — Z951 Presence of aortocoronary bypass graft: Secondary | ICD-10-CM | POA: Diagnosis not present

## 2023-06-08 DIAGNOSIS — I252 Old myocardial infarction: Secondary | ICD-10-CM | POA: Diagnosis not present

## 2023-06-08 DIAGNOSIS — Z48812 Encounter for surgical aftercare following surgery on the circulatory system: Secondary | ICD-10-CM | POA: Diagnosis not present

## 2023-06-08 NOTE — Progress Notes (Signed)
Daily Session Note  Patient Details  Name: Hazael Olveda MRN: [id number] Date of Birth: 08-31-1945 Referring Provider:   Flowsheet Row Cardiac Rehab from 04/27/2023 in [facility] Cardiac and Pulmonary Rehab  Referring Provider Dr. [name]       Encounter Date: 06/08/2023  Check In:  Session Check In - 06/08/23 1135       Check-In   Supervising physician immediately available to respond to emergencies See telemetry face sheet for immediately available ER MD    Location [facility]-Cardiac & Pulmonary Rehab    Staff Present Rory Percy, MS, Exercise Physiologist;Maxon Suzzette Righter, Exercise Physiologist;Kelly Cloretta Ned, ACSM CEP, Exercise Physiologist;Derenda Giddings, RN, BSN, CCRP    Virtual Visit No    Medication changes reported     No    Fall or balance concerns reported    No    Warm-up and Cool-down Performed on first and last piece of equipment    Resistance Training Performed Yes    VAD Patient? No    PAD/SET Patient? No      Pain Assessment   Currently in Pain? No/denies                Social History   Tobacco Use  Smoking Status Former   Current packs/day: 0.00   Average packs/day: 1 pack/day for 62.0 years (62.0 ttl pk-yrs)   Types: Cigarettes   Start date: 01/13/1960   Quit date: 01/12/2022   Years since quitting: 1.4  Smokeless Tobacco Never    Goals Met:  Independence with exercise equipment Exercise tolerated well No report of concerns or symptoms today  Goals Unmet:  Not Applicable  Comments: Pt able to follow exercise prescription today without complaint.  Will continue to monitor for progression.    Dr. Bethann Punches is Medical Director for [facility] Cardiac Rehabilitation.  Dr. Vida Rigger is Medical Director for [facility] Pulmonary Rehabilitation.

## 2023-06-10 DIAGNOSIS — Z951 Presence of aortocoronary bypass graft: Secondary | ICD-10-CM | POA: Diagnosis not present

## 2023-06-10 DIAGNOSIS — I213 ST elevation (STEMI) myocardial infarction of unspecified site: Secondary | ICD-10-CM

## 2023-06-10 DIAGNOSIS — Z48812 Encounter for surgical aftercare following surgery on the circulatory system: Secondary | ICD-10-CM | POA: Diagnosis not present

## 2023-06-10 DIAGNOSIS — I252 Old myocardial infarction: Secondary | ICD-10-CM | POA: Diagnosis not present

## 2023-06-10 NOTE — Progress Notes (Signed)
Daily Session Note  Patient Details  Name: Jon [name] MRN: [id number] Date of Birth: 1945/07/25 Referring Provider:   Flowsheet Row Cardiac Rehab from 04/27/2023 in [facility] Cardiac and Pulmonary Rehab  Referring Provider Dr. [name]       Encounter Date: 06/10/2023  Check In:  Session Check In - 06/10/23 1115       Check-In   Supervising physician immediately available to respond to emergencies See telemetry face sheet for immediately available ER MD    Location [facility]-Cardiac & Pulmonary Rehab    Staff Present Kelton Pillar RN,BSN,MPA;Margaret Best, MS, Exercise Physiologist;Meredith Jewel Baize RN,BSN;Joseph Reino Kent RCP,RRT,BSRT    Virtual Visit No    Medication changes reported     No    Fall or balance concerns reported    No    Warm-up and Cool-down Performed on first and last piece of equipment    Resistance Training Performed Yes    VAD Patient? No    PAD/SET Patient? No      Pain Assessment   Currently in Pain? No/denies                Social History   Tobacco Use  Smoking Status Former   Current packs/day: 0.00   Average packs/day: 1 pack/day for 62.0 years (62.0 ttl pk-yrs)   Types: Cigarettes   Start date: 01/13/1960   Quit date: 01/12/2022   Years since quitting: 1.4  Smokeless Tobacco Never    Goals Met:  Independence with exercise equipment Exercise tolerated well No report of concerns or symptoms today Strength training completed today  Goals Unmet:  Not Applicable  Comments: Pt able to follow exercise prescription today without complaint.  Will continue to monitor for progression.    Dr. Bethann Punches is Medical Director for [facility] Cardiac Rehabilitation.  Dr. Vida Rigger is Medical Director for [facility] Pulmonary Rehabilitation.

## 2023-06-15 ENCOUNTER — Encounter: Payer: Medicare Other | Attending: Cardiology

## 2023-06-15 DIAGNOSIS — I213 ST elevation (STEMI) myocardial infarction of unspecified site: Secondary | ICD-10-CM | POA: Diagnosis present

## 2023-06-15 DIAGNOSIS — Z951 Presence of aortocoronary bypass graft: Secondary | ICD-10-CM | POA: Diagnosis not present

## 2023-06-15 DIAGNOSIS — I252 Old myocardial infarction: Secondary | ICD-10-CM | POA: Diagnosis not present

## 2023-06-15 DIAGNOSIS — Z48812 Encounter for surgical aftercare following surgery on the circulatory system: Secondary | ICD-10-CM | POA: Diagnosis not present

## 2023-06-15 NOTE — Progress Notes (Signed)
Daily Session Note  Patient Details  Name: Jon [name] MRN: [id number] Date of Birth: 1946-05-04 Referring Provider:   Flowsheet Row Cardiac Rehab from 04/27/2023 in [facility] Cardiac and Pulmonary Rehab  Referring Provider Dr. [name]       Encounter Date: 06/15/2023  Check In:  Session Check In - 06/15/23 1117       Check-In   Supervising physician immediately available to respond to emergencies See telemetry face sheet for immediately available ER MD    Location [facility]-Cardiac & Pulmonary Rehab    Staff Present Kelton Pillar RN,BSN,MPA;Margaret Best, MS, Exercise Physiologist;Maxon Conetta BS, Exercise Physiologist;Dimitri Shakespeare Cloretta Ned, ACSM CEP, Exercise Physiologist    Virtual Visit No    Medication changes reported     No    Fall or balance concerns reported    No    Warm-up and Cool-down Performed on first and last piece of equipment    Resistance Training Performed Yes    VAD Patient? No    PAD/SET Patient? No      Pain Assessment   Currently in Pain? No/denies                Social History   Tobacco Use  Smoking Status Former   Current packs/day: 0.00   Average packs/day: 1 pack/day for 62.0 years (62.0 ttl pk-yrs)   Types: Cigarettes   Start date: 01/13/1960   Quit date: 01/12/2022   Years since quitting: 1.4  Smokeless Tobacco Never    Goals Met:  Independence with exercise equipment Exercise tolerated well No report of concerns or symptoms today Strength training completed today  Goals Unmet:  Not Applicable  Comments: Pt able to follow exercise prescription today without complaint.  Will continue to monitor for progression.    Dr. Bethann Punches is Medical Director for [facility] Cardiac Rehabilitation.  Dr. Vida Rigger is Medical Director for [facility] Pulmonary Rehabilitation.

## 2023-06-17 ENCOUNTER — Telehealth: Payer: Self-pay

## 2023-06-17 ENCOUNTER — Encounter: Payer: Medicare Other | Admitting: *Deleted

## 2023-06-17 DIAGNOSIS — Z48812 Encounter for surgical aftercare following surgery on the circulatory system: Secondary | ICD-10-CM | POA: Diagnosis not present

## 2023-06-17 DIAGNOSIS — Z951 Presence of aortocoronary bypass graft: Secondary | ICD-10-CM | POA: Diagnosis not present

## 2023-06-17 DIAGNOSIS — I252 Old myocardial infarction: Secondary | ICD-10-CM | POA: Diagnosis not present

## 2023-06-17 NOTE — Telephone Encounter (Addendum)
 Forwarding to [place] as an FINANCIAL PLANNER.  Tonya   [employer]ovo [employer] PAP shipment for Tresiba  100u dose / 3 boxes, Ozempic  0.25/0.5 mg / 2 boxes, and NovoFine 32 g needles / 1 box received this morning. Please contact the patient to come and pick up their order today. Placed in the PAP fridge with patient identifier. Thanks in advance.   NDC: 9830-7339-84 LOT: [id number] EXP: 2025-09-08  NDC: 9830-5818-86 LOT: [id number] EXP: 2026-01-09  LIST: [id number] LOT: [id number] EXP: 2027-11-09

## 2023-06-17 NOTE — Progress Notes (Signed)
 Daily Session Note  Patient Details  Name: Jon [name] MRN: [id number] Date of Birth: [birth year] Referring Provider:   Flowsheet Row Cardiac Rehab from 04/27/2023 in [facility] Cardiac and Pulmonary Rehab  Referring Provider Dr. Alm Clay       Encounter Date: 06/17/2023  Check In:  Session Check In - 06/17/23 1119       Check-In   Supervising physician immediately available to respond to emergencies See telemetry face sheet for immediately available ER MD    Location [facility]-Cardiac & Pulmonary Rehab    Staff Present Rollene Paterson, MS, Exercise Physiologist;Joseph Hood RCP,RRT,BSRT;Meredith Tressa RN,BSN;Alexiana Laverdure, RN, BSN, CCRP    Virtual Visit No    Medication changes reported     No    Fall or balance concerns reported    No    Warm-up and Cool-down Performed on first and last piece of equipment    Resistance Training Performed Yes    VAD Patient? No    PAD/SET Patient? No      Pain Assessment   Currently in Pain? No/denies                Social History   Tobacco Use  Smoking Status Former   Current packs/day: 0.00   Average packs/day: 1 pack/day for 62.0 years (62.0 ttl pk-yrs)   Types: Cigarettes   Start date: 01/13/1960   Quit date: 01/12/2022   Years since quitting: 1.4  Smokeless Tobacco Never    Goals Met:  Independence with exercise equipment Exercise tolerated well No report of concerns or symptoms today  Goals Unmet:  Not Applicable  Comments: Pt able to follow exercise prescription today without complaint.  Will continue to monitor for progression.   Reviewed home exercise with pt today.  Pt plans to go to [employer] for exercise.  Reviewed THR, pulse, RPE, sign and symptoms, pulse oximetery and when to call 911 or MD.  Also discussed weather considerations and indoor options.  Pt voiced understanding.   Dr. Oneil Pinal is Medical Director for [facility] Cardiac Rehabilitation.  Dr. Fuad Aleskerov is Medical Director for [facility]  Pulmonary Rehabilitation.

## 2023-06-17 NOTE — Telephone Encounter (Signed)
 LVM advising pt of medication being here and ready for pick up. Advised if office hours.

## 2023-06-22 ENCOUNTER — Encounter: Payer: Medicare Other | Admitting: *Deleted

## 2023-06-22 DIAGNOSIS — Z951 Presence of aortocoronary bypass graft: Secondary | ICD-10-CM

## 2023-06-22 DIAGNOSIS — Z48812 Encounter for surgical aftercare following surgery on the circulatory system: Secondary | ICD-10-CM | POA: Diagnosis not present

## 2023-06-22 DIAGNOSIS — I213 ST elevation (STEMI) myocardial infarction of unspecified site: Secondary | ICD-10-CM

## 2023-06-22 DIAGNOSIS — I252 Old myocardial infarction: Secondary | ICD-10-CM | POA: Diagnosis not present

## 2023-06-22 NOTE — Progress Notes (Signed)
 Daily Session Note  Patient Details  Name: Jon [name] MRN: [id number] Date of Birth: 1945/11/26 Referring Provider:   Flowsheet Row Cardiac Rehab from 04/27/2023 in [facility] Cardiac and Pulmonary Rehab  Referring Provider Dr. Randene Bustard       Encounter Date: 06/22/2023  Check In:  Session Check In - 06/22/23 1132       Check-In   Supervising physician immediately available to respond to emergencies See telemetry face sheet for immediately available ER MD    Location [facility]-Cardiac & Pulmonary Rehab    Staff Present Maud Sorenson, RN, BSN, CCRP;Margaret Best, MS, Exercise Physiologist;Meredith Manson Seitz RN,BSN;Kelly Sabra Cramp BS, ACSM CEP, Exercise Physiologist;Maxon Conetta BS, Exercise Physiologist    Virtual Visit No    Medication changes reported     No    Fall or balance concerns reported    No    Warm-up and Cool-down Performed on first and last piece of equipment    Resistance Training Performed Yes    VAD Patient? No    PAD/SET Patient? No      Pain Assessment   Currently in Pain? No/denies                Social History   Tobacco Use  Smoking Status Former   Current packs/day: 0.00   Average packs/day: 1 pack/day for 62.0 years (62.0 ttl pk-yrs)   Types: Cigarettes   Start date: 01/13/1960   Quit date: 01/12/2022   Years since quitting: 1.4  Smokeless Tobacco Never    Goals Met:  Independence with exercise equipment Exercise tolerated well No report of concerns or symptoms today  Goals Unmet:  Not Applicable  Comments: Pt able to follow exercise prescription today without complaint.  Will continue to monitor for progression.    Dr. Firman Hughes is Medical Director for [facility] Cardiac Rehabilitation.  Dr. Fuad Aleskerov is Medical Director for [facility] Pulmonary Rehabilitation.

## 2023-06-24 ENCOUNTER — Encounter: Payer: Self-pay | Admitting: *Deleted

## 2023-06-24 DIAGNOSIS — Z951 Presence of aortocoronary bypass graft: Secondary | ICD-10-CM

## 2023-06-24 NOTE — Progress Notes (Signed)
Cardiac Individual Treatment Plan  Patient Details  Name: Jon [name] MRN: [id number] Date of Birth: 08-09-1945 Referring Provider:   Flowsheet Row Cardiac Rehab from 04/27/2023 in [facility] Cardiac and Pulmonary Rehab  Referring Provider Dr. [name]       Initial Encounter Date:  Flowsheet Row Cardiac Rehab from 04/27/2023 in [facility] Cardiac and Pulmonary Rehab  Date 04/27/23       Visit Diagnosis: S/P CABG x 3  Patient's Home Medications on Admission:  Current Outpatient Medications:    Accu-Chek Softclix Lancets lancets, Use to check blood sugars twice daily, Disp: 300 each, Rfl: 3   aspirin EC 81 MG tablet, Take 1 tablet (81 mg total) by mouth daily. Swallow whole., Disp: , Rfl:    atorvastatin (LIPITOR) 80 MG tablet, TAKE 1 TABLET BY MOUTH AT  BEDTIME, Disp: 100 tablet, Rfl: 2   Blood Glucose Monitoring Suppl (ACCU-CHEK GUIDE ME) w/Device KIT, Check glucose three times daily. Dx. Code E11.8, Disp: 1 kit, Rfl: 3   clopidogrel (PLAVIX) 75 MG tablet, Take 1 tablet (75 mg total) by mouth daily., Disp: 90 tablet, Rfl: 3   glucose blood (ACCU-CHEK GUIDE) test strip, Dx DM E11.9. Check fasting blood sugar every morning., Disp: 300 each, Rfl: PRN   insulin degludec (TRESIBA FLEXTOUCH) 100 UNIT/ML FlexTouch Pen, Inject 20 Units into the skin daily. (Patient taking differently: Inject 20 Units into the skin daily as needed (high blood sugar per patient).), Disp: 1 mL, Rfl: 0   metoprolol succinate (TOPROL-XL) 25 MG 24 hr tablet, Take 1 tablet (25 mg total) by mouth daily., Disp: 90 tablet, Rfl: 3   Semaglutide,0.25 or 0.5MG /DOS, 2 MG/3ML SOPN, Inject 0.5 mg into the skin every 7 (seven) days., Disp: , Rfl:    SYNJARDY 12.5-500 MG TABS, TAKE 1 TABLET BY MOUTH TWICE  DAILY, Disp: 180 tablet, Rfl: 3   traZODone (DESYREL) 50 MG tablet, Take 1 tablet (50 mg total) by mouth at bedtime as needed for sleep., Disp: , Rfl:   Past Medical History: Past Medical History:  Diagnosis Date    BENIGN POSITIONAL VERTIGO 05/20/2010   Qualifier: Diagnosis of  By: Linford Arnold MD, Catherine     Centrilobular emphysema [facility])    Cerebrovascular disease 07/15/2018   Prior CVA & TIA. Carotid CTA ~50% Bilateral ICA,  Multifocal severe stenosis of the left V2 vertebral artery.  Right vertebral artery patent without significant stenosis.   Coronary artery disease involving native coronary artery with angina pectoris (HCC) 11/18/2017   Severe LM, LAD & RCA stenosis indicated on Coronary CTA => CaCardiac Cath 01/02/2022: dLM ~50%; mLAD 85%@D1  90% (1,1,1) - m-dLAD 90$ @ 65% D2 (0,1,1), RI 75%, pLCx 30%-OM1 60%, pRCA 25% - p-mRCA hazy 85%. Normal LVEF by Echo, EDP 16 mmHg. = Best option CABGrdiac Cath    Current every day smoker    No interest in quitting   Diabetes mellitus, type II, insulin dependent (HCC)    With peripheral neuropathy, peripheral vascular disease and coronary artery disease as complications.   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 => 12/17/2021: L Subclavia A PTA=Stent with resolution of Sx.   TIA (transient ischemic attack) 11/11/2021   Brief episode of left sided arm pain and slurred speech    Tobacco Use: Social  History   Tobacco Use  Smoking Status Former   Current packs/day: 0.00   Average packs/day: 1 pack/day for 62.0 years (62.0 ttl pk-yrs)   Types: Cigarettes   Start date: 01/13/1960   Quit date: 01/12/2022   Years since quitting: 1.4  Smokeless Tobacco Never    Labs: Review Flowsheet  More data exists      Latest Ref Rng & Units 05/15/2022 12/04/2022 12/11/2022 01/30/2023 04/20/2023  Labs for ITP Cardiac and Pulmonary Rehab  Cholestrol 0 - 200 mg/dL - - 147  - -  LDL (calc) 0 - 99 mg/dL - - 51  - -  HDL-C >82 mg/dL - - 37  - -   Trlycerides <150 mg/dL - - 956  - -  Hemoglobin A1c 4.0 - 5.6 % 8.7  7.8  - - 6.3   PH, Arterial 7.35 - 7.45 - - 7.390  7.331  7.351  7.391  7.372  7.388  7.319  7.310  -  PCO2 arterial 32 - 48 mmHg - - 35.4  31.8  34.8  32.3  35.2  32.8  41.0  38.9  -  Bicarbonate 20.0 - 28.0 mmol/L - - 21.4  16.9  19.4  19.8  20.5  19.8  26.4  21.1  19.6  -  TCO2 22 - 32 mmol/L - - 22  18  20  21  22  25  21  24  23  28  22  21  21  23   -  Acid-base deficit 0.0 - 2.0 mmol/L - - 3.0  8.0  6.0  5.0  4.0  5.0  5.0  6.0  -  O2 Saturation % - - 95  99  99  99  100  100  83  100  100  -    Details       Multiple values from one day are sorted in reverse-chronological order          Exercise Target Goals: Exercise Program Goal: Individual exercise prescription set using results from initial 6 min walk test and THRR while considering  patient's activity barriers and safety.   Exercise Prescription Goal: Initial exercise prescription builds to 30-45 minutes a day of aerobic activity, 2-3 days per week.  Home exercise guidelines will be given to patient during program as part of exercise prescription that the participant will acknowledge.   Education: Aerobic Exercise: - Group verbal and visual presentation on the components of exercise prescription. Introduces F.I.T.T principle from ACSM for exercise prescriptions.  Reviews F.I.T.T. principles of aerobic exercise including progression. Written material given at graduation.   Education: Resistance Exercise: - Group verbal and visual presentation on the components of exercise prescription. Introduces F.I.T.T principle from ACSM for exercise prescriptions  Reviews F.I.T.T. principles of resistance exercise including progression. Written material given at graduation.    Education: Exercise & Equipment Safety: - Individual verbal instruction and demonstration of equipment use and safety with use of the equipment. Flowsheet Row Cardiac Rehab from 06/10/2023  in [facility] Cardiac and Pulmonary Rehab  Date 04/27/23  Educator [facility]  Instruction Review Code 1- Verbalizes Understanding       Education: Exercise Physiology & General Exercise Guidelines: - Group verbal and written instruction with models to review the exercise physiology of the cardiovascular system and associated critical values. Provides general exercise guidelines with specific guidelines to those with heart or lung disease.  Flowsheet Row Cardiac Rehab from 06/10/2023 in [facility] Cardiac and Pulmonary Rehab  Date  05/04/23  Educator [facility]  Instruction Review Code 1- TEFL teacher Understanding       Education: Flexibility, Balance, Mind/Body Relaxation: - Group verbal and visual presentation with interactive activity on the components of exercise prescription. Introduces F.I.T.T principle from ACSM for exercise prescriptions. Reviews F.I.T.T. principles of flexibility and balance exercise training including progression. Also discusses the mind body connection.  Reviews various relaxation techniques to help reduce and manage stress (i.e. Deep breathing, progressive muscle relaxation, and visualization). Balance handout provided to take home. Written material given at graduation.   Activity Barriers & Risk Stratification:  Activity Barriers & Cardiac Risk Stratification - 04/27/23 1543       Activity Barriers & Cardiac Risk Stratification   Activity Barriers Balance Concerns;Back Problems    Cardiac Risk Stratification High             6 Minute Walk:  6 Minute Walk     Row Name 04/27/23 1541         6 Minute Walk   Phase Initial     Distance 765 feet     Walk Time 6 minutes     # of Rest Breaks 0     MPH 1.45     METS 1.49     RPE 12     Perceived Dyspnea  1     VO2 Peak 5.2     Symptoms No     Resting HR 71 bpm     Resting BP 124/78     Resting Oxygen Saturation  98 %     Exercise Oxygen Saturation  during 6 min walk 99 %     Max Ex. HR 83 bpm     Max Ex. BP 120/82      2 Minute Post BP 122/82              Oxygen Initial Assessment:   Oxygen Re-Evaluation:   Oxygen Discharge (Final Oxygen Re-Evaluation):   Initial Exercise Prescription:  Initial Exercise Prescription - 04/27/23 1500       Date of Initial Exercise RX and Referring Provider   Date 04/27/23    Referring Provider Dr. [name]      Oxygen   Maintain Oxygen Saturation 88% or higher      Treadmill   MPH 0.8    Grade 0    Minutes 15    METs 1.6      NuStep   Level 1    SPM 80    Minutes 15    METs 1.49      Arm Ergometer   Level 1    RPM 30    Minutes 15    METs 1.49      Track   Laps 10    Minutes 15    METs 1.54      Prescription Details   Frequency (times per week) 2    Duration Progress to 30 minutes of continuous aerobic without signs/symptoms of physical distress      Intensity   THRR 40-80% of Max Heartrate 100-129    Ratings of Perceived Exertion 11-13    Perceived Dyspnea 0-4      Progression   Progression Continue to progress workloads to maintain intensity without signs/symptoms of physical distress.      Resistance Training   Training Prescription Yes    Weight 4    Reps 10-15             Perform Capillary Blood Glucose checks as  needed.  Exercise Prescription Changes:   Exercise Prescription Changes     Row Name 04/27/23 1500 05/21/23 1400 06/17/23 1000 06/17/23 1100       Response to Exercise   Blood Pressure (Admit) 124/78 112/68 122/64 --    Blood Pressure (Exercise) 120/82 108/58 134/68 --    Blood Pressure (Exit) 122/82 112/56 110/60 --    Heart Rate (Admit) 71 bpm 72 bpm 81 bpm --    Heart Rate (Exercise) 83 bpm 98 bpm 106 bpm --    Heart Rate (Exit) 71 bpm 70 bpm 79 bpm --    Oxygen Saturation (Admit) 98 % -- -- --    Oxygen Saturation (Exercise) 99 % -- -- --    Oxygen Saturation (Exit) 99 % -- -- --    Rating of Perceived Exertion (Exercise) 12 11 13  --    Perceived Dyspnea (Exercise) 1 0 0 --     Symptoms none none none --    Comments 6 MWT resutls first two weeks of exercise -- --    Duration -- Progress to 30 minutes of  aerobic without signs/symptoms of physical distress Progress to 30 minutes of  aerobic without signs/symptoms of physical distress Progress to 30 minutes of  aerobic without signs/symptoms of physical distress    Intensity -- THRR unchanged THRR unchanged THRR unchanged      Progression   Progression -- Continue to progress workloads to maintain intensity without signs/symptoms of physical distress. Continue to progress workloads to maintain intensity without signs/symptoms of physical distress. Continue to progress workloads to maintain intensity without signs/symptoms of physical distress.    Average METs -- 1.9 2.2 2.2      Resistance Training   Training Prescription -- Yes Yes Yes    Weight -- 4 4 4     Reps -- 10-15 10-15 10-15      Interval Training   Interval Training -- No No No      Treadmill   MPH -- -- 1.4 1.4    Grade -- -- 0 0    Minutes -- -- 15 15    METs -- -- 2.07 2.07      Recumbant Bike   Level -- -- 2 2    Watts -- -- 25 25    Minutes -- -- 15 15    METs -- -- 2.83 2.83      NuStep   Level -- 3 4 4     Minutes -- 15 15 15     METs -- 1.9 3.5 3.5      Track   Laps -- -- 10 10    Minutes -- -- 15 15    METs -- -- 1.54 1.54      Home Exercise Plan   Plans to continue exercise at -- -- -- [employer] (comment)  [employer]    Frequency -- -- -- Add 2 additional days to program exercise sessions.    Initial Home Exercises Provided -- -- -- 06/17/23      Oxygen   Maintain Oxygen Saturation -- 88% or higher 88% or higher 88% or higher             Exercise Comments:   Exercise Comments     Row Name 05/04/23 1335           Exercise Comments First full day of exercise!  Patient was oriented to gym and equipment including functions, settings, policies, and procedures.  Patient's individual exercise prescription  and  treatment plan were reviewed.  All starting workloads were established based on the results of the 6 minute walk test done at initial orientation visit.  The plan for exercise progression was also introduced and progression will be customized based on patient's performance and goals.                Exercise Goals and Review:   Exercise Goals     Row Name 04/27/23 1550             Exercise Goals   Increase Physical Activity Yes       Intervention Provide advice, education, support and counseling about physical activity/exercise needs.;Develop an individualized exercise prescription for aerobic and resistive training based on initial evaluation findings, risk stratification, comorbidities and participant's personal goals.       Expected Outcomes Short Term: Attend rehab on a regular basis to increase amount of physical activity.;Long Term: Add in home exercise to make exercise part of routine and to increase amount of physical activity.;Long Term: Exercising regularly at least 3-5 days a week.       Increase Strength and Stamina Yes       Intervention Provide advice, education, support and counseling about physical activity/exercise needs.;Develop an individualized exercise prescription for aerobic and resistive training based on initial evaluation findings, risk stratification, comorbidities and participant's personal goals.       Expected Outcomes Short Term: Increase workloads from initial exercise prescription for resistance, speed, and METs.;Short Term: Perform resistance training exercises routinely during rehab and add in resistance training at home;Long Term: Improve cardiorespiratory fitness, muscular endurance and strength as measured by increased METs and functional capacity ( )       Able to understand and use rate of perceived exertion (RPE) scale Yes       Intervention Provide education and explanation on how to use RPE scale       Expected Outcomes Short Term: Able to  use RPE daily in rehab to express subjective intensity level;Long Term:  Able to use RPE to guide intensity level when exercising independently       Able to understand and use Dyspnea scale Yes       Intervention Provide education and explanation on how to use Dyspnea scale       Expected Outcomes Short Term: Able to use Dyspnea scale daily in rehab to express subjective sense of shortness of breath during exertion;Long Term: Able to use Dyspnea scale to guide intensity level when exercising independently       Knowledge and understanding of Target Heart Rate Range (THRR) Yes       Intervention Provide education and explanation of THRR including how the numbers were predicted and where they are located for reference       Expected Outcomes Short Term: Able to state/look up THRR;Long Term: Able to use THRR to govern intensity when exercising independently;Short Term: Able to use daily as guideline for intensity in rehab       Able to check pulse independently Yes       Intervention Provide education and demonstration on how to check pulse in carotid and radial arteries.;Review the importance of being able to check your own pulse for safety during independent exercise       Expected Outcomes Short Term: Able to explain why pulse checking is important during independent exercise;Long Term: Able to check pulse independently and accurately       Understanding of Exercise Prescription Yes  Intervention Provide education, explanation, and written materials on patient's individual exercise prescription       Expected Outcomes Short Term: Able to explain program exercise prescription;Long Term: Able to explain home exercise prescription to exercise independently                Exercise Goals Re-Evaluation :  Exercise Goals Re-Evaluation     Row Name 05/04/23 1335 05/21/23 1429 06/17/23 1056 06/17/23 1134       Exercise Goal Re-Evaluation   Exercise Goals Review Able to understand and use rate  of perceived exertion (RPE) scale;Knowledge and understanding of Target Heart Rate Range (THRR);Able to understand and use Dyspnea scale;Understanding of Exercise Prescription Increase Physical Activity;Increase Strength and Stamina;Understanding of Exercise Prescription Increase Physical Activity;Increase Strength and Stamina;Understanding of Exercise Prescription Increase Strength and Stamina;Increase Physical Activity;Able to understand and use rate of perceived exertion (RPE) scale;Able to understand and use Dyspnea scale;Knowledge and understanding of Target Heart Rate Range (THRR);Able to check pulse independently;Understanding of Exercise Prescription    Comments Reviewed RPE and dyspnea scale, THR and program prescription with pt today.  Pt voiced understanding and was given a copy of goals to take home. Dorinda Hill is off to a great start in the program. He was only able to attend one session during this review, but he was able to increase his level on the T4 nustep from 1 to 3. We will continue to monitor his progress in the program. Dorinda Hill is doing well in rehab. He was able to increase his speed on the treadmill to 1.15mph. He was also able to increase his level on the T4 nustep from level 3 to 4. We will continue to monitor his progress in the program. Reviewed home exercise with pt today.  Pt plans to go to [employer] for exercise.  Reviewed THR, pulse, RPE, sign and symptoms, pulse oximetery and when to call 911 or MD.  Also discussed weather considerations and indoor options.  Pt voiced understanding.    Expected Outcomes Short: Use RPE daily to regulate intensity. Long: Follow program prescription in THR. Short: Continue to follow current exercise prescription. Long: Continue exercise to improve strength and stamina. Short: Continue to follow current exercise prescription. Long: Continue exercise to improve strength and stamina. Short: add 1-2 days a week of exercise at community facility on of days  of cardiac rehab. Long: maintain independent exercise routine upon graduation from cardiac rehab.             Discharge Exercise Prescription (Final Exercise Prescription Changes):  Exercise Prescription Changes - 06/17/23 1100       Response to Exercise   Duration Progress to 30 minutes of  aerobic without signs/symptoms of physical distress    Intensity THRR unchanged      Progression   Progression Continue to progress workloads to maintain intensity without signs/symptoms of physical distress.    Average METs 2.2      Resistance Training   Training Prescription Yes    Weight 4    Reps 10-15      Interval Training   Interval Training No      Treadmill   MPH 1.4    Grade 0    Minutes 15    METs 2.07      Recumbant Bike   Level 2    Watts 25    Minutes 15    METs 2.83      NuStep   Level 4    Minutes 15  METs 3.5      Track   Laps 10    Minutes 15    METs 1.54      Home Exercise Plan   Plans to continue exercise at [facility] (comment)   [facility] [employer]   Frequency Add 2 additional days to program exercise sessions.    Initial Home Exercises Provided 06/17/23      Oxygen   Maintain Oxygen Saturation 88% or higher             Nutrition:  Target Goals: Understanding of nutrition guidelines, daily intake of sodium 1500mg , cholesterol 200mg , calories 30% from fat and 7% or less from saturated fats, daily to have 5 or more servings of fruits and vegetables.  Education: All About Nutrition: -Group instruction provided by verbal, written material, interactive activities, discussions, models, and posters to present general guidelines for heart healthy nutrition including fat, fiber, MyPlate, the role of sodium in heart healthy nutrition, utilization of the nutrition label, and utilization of this knowledge for meal planning. Follow up email sent as well. Written material given at graduation.   Biometrics:  Pre Biometrics - 04/27/23 1546        Pre Biometrics   Height 6\' 1"  (1.854 m)    Weight 209 lb 12.8 oz (95.2 kg)    Waist Circumference 43.5 inches    Hip Circumference 41 inches    Waist to Hip Ratio 1.06 %    BMI (Calculated) 27.69    Single Leg Stand 2.12 seconds              Nutrition Therapy Plan and Nutrition Goals:  Nutrition Therapy & Goals - 04/27/23 1547       Nutrition Therapy   RD appointment deferred Yes             Nutrition Assessments:  MEDIFICTS Score Key: >=70 Need to make dietary changes  40-70 Heart Healthy Diet <= 40 Therapeutic Level Cholesterol Diet  Flowsheet Row Cardiac Rehab from 05/04/2023 in [facility] Cardiac and Pulmonary Rehab  Picture Your Plate Total Score on Admission 44      Picture Your Plate Scores: <91 Unhealthy dietary pattern with much room for improvement. 41-50 Dietary pattern unlikely to meet recommendations for good health and room for improvement. 51-60 More healthful dietary pattern, with some room for improvement.  >60 Healthy dietary pattern, although there may be some specific behaviors that could be improved.    Nutrition Goals Re-Evaluation:   Nutrition Goals Discharge (Final Nutrition Goals Re-Evaluation):   Psychosocial: Target Goals: Acknowledge presence or absence of significant depression and/or stress, maximize coping skills, provide positive support system. Participant is able to verbalize types and ability to use techniques and skills needed for reducing stress and depression.   Education: Stress, Anxiety, and Depression - Group verbal and visual presentation to define topics covered.  Reviews how body is impacted by stress, anxiety, and depression.  Also discusses healthy ways to reduce stress and to treat/manage anxiety and depression.  Written material given at graduation.   Education: Sleep Hygiene -Provides group verbal and written instruction about how sleep can affect your health.  Define sleep hygiene, discuss sleep cycles and  impact of sleep habits. Review good sleep hygiene tips.    Initial Review & Psychosocial Screening:  Initial Psych Review & Screening - 04/23/23 1502       Initial Review   Current issues with Current Stress Concerns    Source of Stress Concerns Unable to participate in former  interests or hobbies;Unable to perform yard/household activities      Family Dynamics   Good Support System? Yes      Barriers   Psychosocial barriers to participate in program There are no identifiable barriers or psychosocial needs.;The patient should benefit from training in stress management and relaxation.      Screening Interventions   Interventions Encouraged to exercise;Provide feedback about the scores to participant;To provide support and resources with identified psychosocial needs    Expected Outcomes Short Term goal: Utilizing psychosocial counselor, staff and physician to assist with identification of specific Stressors or current issues interfering with healing process. Setting desired goal for each stressor or current issue identified.;Long Term Goal: Stressors or current issues are controlled or eliminated.;Short Term goal: Identification and review with participant of any Quality of Life or Depression concerns found by scoring the questionnaire.;Long Term goal: The participant improves quality of Life and PHQ9 Scores as seen by post scores and/or verbalization of changes             Quality of Life Scores:   Quality of Life - 05/04/23 1422       Quality of Life   Select Quality of Life      Quality of Life Scores   Health/Function Pre 23.09 %    Socioeconomic Pre 22.32 %    Psych/Spiritual Pre 23.5 %    Family Pre 21.07 %    GLOBAL Pre 28.5 %            Scores of 19 and below usually indicate a poorer quality of life in these areas.  A difference of  2-3 points is a clinically meaningful difference.  A difference of 2-3 points in the total score of the Quality of Life Index has  been associated with significant improvement in overall quality of life, self-image, physical symptoms, and general health in studies assessing change in quality of life.  PHQ-9: Review Flowsheet  More data exists      04/27/2023 12/04/2022 08/01/2022 01/20/2022 07/26/2021  Depression screen PHQ 2/9  Decreased Interest 0 0 0 0 0  Down, Depressed, Hopeless 0 0 0 0 0  PHQ - 2 Score 0 0 0 0 0  Altered sleeping 1 - - - -  Tired, decreased energy 1 - - - -  Change in appetite 0 - - - -  Feeling bad or failure about yourself  0 - - - -  Trouble concentrating 0 - - - -  Moving slowly or fidgety/restless 0 - - - -  Suicidal thoughts 0 - - - -  PHQ-9 Score 2 - - - -  Difficult doing work/chores Not difficult at all - - - -   Interpretation of Total Score  Total Score Depression Severity:  1-4 = Minimal depression, 5-9 = Mild depression, 10-14 = Moderate depression, 15-19 = Moderately severe depression, 20-27 = Severe depression   Psychosocial Evaluation and Intervention:  Psychosocial Evaluation - 04/23/23 1506       Psychosocial Evaluation & Interventions   Interventions Encouraged to exercise with the program and follow exercise prescription    Comments Mr. 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 He has had a long recovery related to balance and walking. He has spent 22 days in a rehab facility and completed [facility] rehab. He is hesitant about the program because he is unsure what more rehab will do for him. We discussed how this is a more independent program that will  help him strengthen his balance and regain muscle. He wants to work on his independence so he can maintain it.    Expected Outcomes Short: attend cardiac rehab for education and exercise. Long: develop and maintain positive self care habits    Continue Psychosocial Services  Follow up required by staff             Psychosocial Re-Evaluation:   Psychosocial Discharge (Final Psychosocial  Re-Evaluation):   Vocational Rehabilitation: Provide vocational rehab assistance to qualifying candidates.   Vocational Rehab Evaluation & Intervention:  Vocational Rehab - 04/23/23 1502       Initial Vocational Rehab Evaluation & Intervention   Assessment shows need for Vocational Rehabilitation No             Education: Education Goals: Education classes will be provided on a variety of topics geared toward better understanding of heart health and risk factor modification. Participant will state understanding/return demonstration of topics presented as noted by education test scores.  Learning Barriers/Preferences:  Learning Barriers/Preferences - 04/23/23 1502       Learning Barriers/Preferences   Learning Barriers None    Learning Preferences Individual Instruction             General Cardiac Education Topics:  AED/CPR: - Group verbal and written instruction with the use of models to demonstrate the basic use of the AED with the basic ABC's of resuscitation.   Anatomy and Cardiac Procedures: - Group verbal and visual presentation and models provide information about basic cardiac anatomy and function. Reviews the testing methods done to diagnose heart disease and the outcomes of the test results. Describes the treatment choices: Medical Management, Angioplasty, or Coronary Bypass Surgery for treating various heart conditions including Myocardial Infarction, Angina, Valve Disease, and Cardiac Arrhythmias.  Written material given at graduation. Flowsheet Row Cardiac Rehab from 06/10/2023 in [facility] Cardiac and Pulmonary Rehab  Date 06/10/23  Educator sb  Instruction Review Code 1- Verbalizes Understanding       Medication Safety: - Group verbal and visual instruction to review commonly prescribed medications for heart and lung disease. Reviews the medication, class of the drug, and side effects. Includes the steps to properly store meds and maintain the prescription  regimen.  Written material given at graduation.   Intimacy: - Group verbal instruction through game format to discuss how heart and lung disease can affect sexual intimacy. Written material given at graduation..   Know Your Numbers and Heart Failure: - Group verbal and visual instruction to discuss disease risk factors for cardiac and pulmonary disease and treatment options.  Reviews associated critical values for Overweight/Obesity, Hypertension, Cholesterol, and Diabetes.  Discusses basics of heart failure: signs/symptoms and treatments.  Introduces Heart Failure Zone chart for action plan for heart failure.  Written material given at graduation.   Infection Prevention: - Provides verbal and written material to individual with discussion of infection control including proper hand washing and proper equipment cleaning during exercise session. Flowsheet Row Cardiac Rehab from 06/10/2023 in [facility] Cardiac and Pulmonary Rehab  Date 04/27/23  Educator [facility]  Instruction Review Code 1- Verbalizes Understanding       Falls Prevention: - Provides verbal and written material to individual with discussion of falls prevention and safety. Flowsheet Row Cardiac Rehab from 06/10/2023 in [facility] Cardiac and Pulmonary Rehab  Date 04/27/23  Educator [facility]  Instruction Review Code 1- Verbalizes Understanding       Other: -Provides group and verbal instruction on various topics (see comments)  Knowledge Questionnaire Score:  Knowledge Questionnaire Score - 05/04/23 1424       Knowledge Questionnaire Score   Pre Score 22/26nutrition, exercie   HF             Core Components/Risk Factors/Patient Goals at Admission:  Personal Goals and Risk Factors at Admission - 04/27/23 1548       Core Components/Risk Factors/Patient Goals on Admission    Weight Management Yes;Weight Maintenance    Intervention Weight Management: Develop a combined nutrition and exercise program designed to reach desired  caloric intake, while maintaining appropriate intake of nutrient and fiber, sodium and fats, and appropriate energy expenditure required for the weight goal.;Weight Management: Provide education and appropriate resources to help participant work on and attain dietary goals.;Weight Management/Obesity: Establish reasonable short term and long term weight goals.    Admit Weight 209 lb 12.8 oz (95.2 kg)    Goal Weight: Short Term 210 lb (95.3 kg)    Goal Weight: Long Term 210 lb (95.3 kg)    Expected Outcomes Short Term: Continue to assess and modify interventions until short term weight is achieved;Long Term: Adherence to nutrition and physical activity/exercise program aimed toward attainment of established weight goal;Weight Maintenance: Understanding of the daily nutrition guidelines, which includes 25-35% calories from fat, 7% or less cal from saturated fats, less than 200mg  cholesterol, less than 1.5gm of sodium, & 5 or more servings of fruits and vegetables daily;Understanding recommendations for meals to include 15-35% energy as protein, 25-35% energy from fat, 35-60% energy from carbohydrates, less than 200mg  of dietary cholesterol, 20-35 gm of total fiber daily;Understanding of distribution of calorie intake throughout the day with the consumption of 4-5 meals/snacks    Diabetes Yes    Intervention Provide education about proper nutrition, including hydration, and aerobic/resistive exercise prescription along with prescribed medications to achieve blood glucose in normal ranges: Fasting glucose 65-99 mg/dL;Provide education about signs/symptoms and action to take for hypo/hyperglycemia.    Expected Outcomes Short Term: Participant verbalizes understanding of the signs/symptoms and immediate care of hyper/hypoglycemia, proper foot care and importance of medication, aerobic/resistive exercise and nutrition plan for blood glucose control.;Long Term: Attainment of HbA1C < 7%.    Hypertension Yes     Intervention Provide education on lifestyle modifcations including regular physical activity/exercise, weight management, moderate sodium restriction and increased consumption of fresh fruit, vegetables, and low fat dairy, alcohol moderation, and smoking cessation.;Monitor prescription use compliance.    Expected Outcomes Short Term: Continued assessment and intervention until BP is < 140/83mm HG in hypertensive participants. < 130/32mm HG in hypertensive participants with diabetes, heart failure or chronic kidney disease.;Long Term: Maintenance of blood pressure at goal levels.             Education:Diabetes - Individual verbal and written instruction to review signs/symptoms of diabetes, desired ranges of glucose level fasting, after meals and with exercise. Acknowledge that pre and post exercise glucose checks will be done for 3 sessions at entry of program. Flowsheet Row Cardiac Rehab from 06/10/2023 in [facility] Cardiac and Pulmonary Rehab  Date 04/27/23  Educator [facility]  Instruction Review Code 1- Verbalizes Understanding       Core Components/Risk Factors/Patient Goals Review:    Core Components/Risk Factors/Patient Goals at Discharge (Final Review):    ITP Comments:  ITP Comments     Row Name 04/23/23 1500 04/27/23 1541 04/29/23 0944 05/04/23 1335 05/27/23 1251   ITP Comments Initial phone call completed. Diagnosis can be found in [facility] 12/4. EP Orientation  scheduled for Monday 12/16 at 1:30. Completed and gym orientation. Initial ITP created and sent for review to Dr. Bethann Punches, Medical Director. 30 Day review completed. Medical Director ITP review done, changes made as directed, and signed approval by Medical Director.    new to program First full day of exercise!  Patient was oriented to gym and equipment including functions, settings, policies, and procedures.  Patient's individual exercise prescription and treatment plan were reviewed.  All starting workloads were established  based on the results of the 6 minute walk test done at initial orientation visit.  The plan for exercise progression was also introduced and progression will be customized based on patient's performance and goals. 30 Day review completed. Medical Director ITP review done, changes made as directed, and signed approval by Medical Director.    new to program    Row Name 06/24/23 0750           ITP Comments 30 Day review completed. Medical Director ITP review done, changes made as directed, and signed approval by Medical Director.                Comments:

## 2023-06-24 NOTE — Telephone Encounter (Signed)
Called and advised pt that medication is ready for pick up he said he would be here to pick up today

## 2023-06-29 ENCOUNTER — Encounter: Payer: Medicare Other | Admitting: *Deleted

## 2023-06-29 DIAGNOSIS — Z951 Presence of aortocoronary bypass graft: Secondary | ICD-10-CM | POA: Diagnosis not present

## 2023-06-29 DIAGNOSIS — Z48812 Encounter for surgical aftercare following surgery on the circulatory system: Secondary | ICD-10-CM | POA: Diagnosis not present

## 2023-06-29 DIAGNOSIS — I213 ST elevation (STEMI) myocardial infarction of unspecified site: Secondary | ICD-10-CM

## 2023-06-29 DIAGNOSIS — I252 Old myocardial infarction: Secondary | ICD-10-CM | POA: Diagnosis not present

## 2023-06-29 NOTE — Progress Notes (Signed)
Daily Session Note  Patient Details  Name: Jon [name] MRN: [id number] Date of Birth: 1945/08/18 Referring Provider:   Flowsheet Row Cardiac Rehab from 04/27/2023 in [facility] Cardiac and Pulmonary Rehab  Referring Provider Dr. [name]       Encounter Date: 06/29/2023  Check In:  Session Check In - 06/29/23 1123       Check-In   Supervising physician immediately available to respond to emergencies See telemetry face sheet for immediately available ER MD    Location [facility]-Cardiac & Pulmonary Rehab    Staff Present [name], RN, BSN, CCRP;Meredith Jewel Baize RN,BSN;Kelly Easton BS, ACSM CEP, Exercise Physiologist;Maxon Conetta BS, Exercise Physiologist    Virtual Visit No    Medication changes reported     No    Fall or balance concerns reported    No    Warm-up and Cool-down Performed on first and last piece of equipment    Resistance Training Performed Yes    VAD Patient? No    PAD/SET Patient? No      Pain Assessment   Currently in Pain? No/denies                Social History   Tobacco Use  Smoking Status Former   Current packs/day: 0.00   Average packs/day: 1 pack/day for 62.0 years (62.0 ttl pk-yrs)   Types: Cigarettes   Start date: 01/13/1960   Quit date: 01/12/2022   Years since quitting: 1.4  Smokeless Tobacco Never    Goals Met:  Independence with exercise equipment Exercise tolerated well No report of concerns or symptoms today  Goals Unmet:  Not Applicable  Comments: Pt able to follow exercise prescription today without complaint.  Will continue to monitor for progression.    Dr. Bethann Punches is Medical Director for [facility] Cardiac Rehabilitation.  Dr. Vida Rigger is Medical Director for [facility] Pulmonary Rehabilitation.

## 2023-07-01 ENCOUNTER — Encounter: Payer: Medicare Other | Admitting: *Deleted

## 2023-07-01 DIAGNOSIS — I252 Old myocardial infarction: Secondary | ICD-10-CM | POA: Diagnosis not present

## 2023-07-01 DIAGNOSIS — Z951 Presence of aortocoronary bypass graft: Secondary | ICD-10-CM | POA: Diagnosis not present

## 2023-07-01 DIAGNOSIS — Z48812 Encounter for surgical aftercare following surgery on the circulatory system: Secondary | ICD-10-CM | POA: Diagnosis not present

## 2023-07-01 NOTE — Progress Notes (Signed)
Daily Session Note  Patient Details  Name: Severo Beber MRN: [id number] Date of Birth: 08-15-1945 Referring Provider:   Flowsheet Row Cardiac Rehab from 04/27/2023 in [facility] Cardiac and Pulmonary Rehab  Referring Provider Dr. [name]       Encounter Date: 07/01/2023  Check In:  Session Check In - 07/01/23 1121       Check-In   Supervising physician immediately available to respond to emergencies See telemetry face sheet for immediately available ER MD    Location [facility]-Cardiac & Pulmonary Rehab    Staff Present [name], RN, BSN, CCRP;Margaret Best, MS, Exercise Physiologist;Maxon Conetta BS, Exercise Physiologist;Joseph Reino Kent RCP,RRT,BSRT    Virtual Visit No    Medication changes reported     No    Fall or balance concerns reported    No    Warm-up and Cool-down Performed on first and last piece of equipment    Resistance Training Performed Yes    VAD Patient? No    PAD/SET Patient? No      Pain Assessment   Currently in Pain? No/denies                Social History   Tobacco Use  Smoking Status Former   Current packs/day: 0.00   Average packs/day: 1 pack/day for 62.0 years (62.0 ttl pk-yrs)   Types: Cigarettes   Start date: 01/13/1960   Quit date: 01/12/2022   Years since quitting: 1.4  Smokeless Tobacco Never    Goals Met:  Independence with exercise equipment Exercise tolerated well No report of concerns or symptoms today  Goals Unmet:  Not Applicable  Comments: Pt able to follow exercise prescription today without complaint.  Will continue to monitor for progression.    Dr. Bethann Punches is Medical Director for [facility] Cardiac Rehabilitation.  Dr. Vida Rigger is Medical Director for [facility] Pulmonary Rehabilitation.

## 2023-07-06 ENCOUNTER — Encounter: Payer: Medicare Other | Admitting: *Deleted

## 2023-07-06 DIAGNOSIS — Z48812 Encounter for surgical aftercare following surgery on the circulatory system: Secondary | ICD-10-CM | POA: Diagnosis not present

## 2023-07-06 DIAGNOSIS — Z951 Presence of aortocoronary bypass graft: Secondary | ICD-10-CM

## 2023-07-06 DIAGNOSIS — I252 Old myocardial infarction: Secondary | ICD-10-CM | POA: Diagnosis not present

## 2023-07-06 LAB — GLUCOSE, CAPILLARY: Glucose-Capillary: 236 mg/dL — ABNORMAL HIGH (ref 70–99)

## 2023-07-06 NOTE — Progress Notes (Signed)
 Daily Session Note  Patient Details  Name: Jon [name] MRN: [id number] Date of Birth: Aug 26, 1945 Referring Provider:   Flowsheet Row Cardiac Rehab from 04/27/2023 in [facility] Cardiac and Pulmonary Rehab  Referring Provider Dr. [name]       Encounter Date: 07/06/2023  Check In:  Session Check In - 07/06/23 1118       Check-In   Supervising physician immediately available to respond to emergencies See telemetry face sheet for immediately available ER MD    Location [facility]-Cardiac & Pulmonary Rehab    Staff Present [name], RN, BSN, CCRP;Kelly Hayes BS, ACSM CEP, Exercise Physiologist;Margaret Best, MS, Exercise Physiologist;Maxon Conetta BS, Exercise Physiologist    Virtual Visit No    Medication changes reported     No    Fall or balance concerns reported    No    Warm-up and Cool-down Performed on first and last piece of equipment    Resistance Training Performed Yes    VAD Patient? No    PAD/SET Patient? No      Pain Assessment   Currently in Pain? No/denies                Social History   Tobacco Use  Smoking Status Former   Current packs/day: 0.00   Average packs/day: 1 pack/day for 62.0 years (62.0 ttl pk-yrs)   Types: Cigarettes   Start date: 01/13/1960   Quit date: 01/12/2022   Years since quitting: 1.4  Smokeless Tobacco Never    Goals Met:  Independence with exercise equipment Exercise tolerated well No report of concerns or symptoms today  Goals Unmet:  Not Applicable  Comments: Pt able to follow exercise prescription today without complaint.  Will continue to monitor for progression.    Dr. Bethann Punches is Medical Director for [facility] Cardiac Rehabilitation.  Dr. Vida Rigger is Medical Director for [facility] Pulmonary Rehabilitation.

## 2023-07-08 ENCOUNTER — Encounter: Payer: Medicare Other | Admitting: *Deleted

## 2023-07-08 ENCOUNTER — Telehealth: Payer: Self-pay | Admitting: Family Medicine

## 2023-07-08 DIAGNOSIS — Z48812 Encounter for surgical aftercare following surgery on the circulatory system: Secondary | ICD-10-CM | POA: Diagnosis not present

## 2023-07-08 DIAGNOSIS — I213 ST elevation (STEMI) myocardial infarction of unspecified site: Secondary | ICD-10-CM

## 2023-07-08 DIAGNOSIS — I252 Old myocardial infarction: Secondary | ICD-10-CM | POA: Diagnosis not present

## 2023-07-08 DIAGNOSIS — Z951 Presence of aortocoronary bypass graft: Secondary | ICD-10-CM

## 2023-07-08 NOTE — Telephone Encounter (Signed)
-----   Message from Nurse Lynnae Sandhoff B sent at 07/07/2023  7:52 AM EST ----- Regarding: Blood pressure drop after Cardiac Rehab ssession Jon [name] has had 2 episodes of a low BP  78/48 after he had completed his exercise session. He stated that the lights were really bright as a symptom.  He told us his lisinopril had been stopped and he is compliant with his meds.    We gave him water to drink, 24 ounces and had his feet up in a chair.  His BP rose to 96/60 and 104/58  without any symptoms reported.    We did check a blood sugar and it was in normal range.   He states this has not happened anywhere else.  [name] RN

## 2023-07-08 NOTE — Telephone Encounter (Signed)
 Call patient and let him know that cardiac rehab did let us know about his drop in blood pressure we just wanted to check on him and see how he is doing.  I know he does not have an appoint with me until April but if he feels like we need to get him in sooner if he still having some symptoms intermittently then please let us know.  Sure hydrating well before exercise.

## 2023-07-08 NOTE — Progress Notes (Signed)
 Daily Session Note  Patient Details  Name: Jon [name] MRN: [id number] Date of Birth: [birth year] Referring Provider:   Flowsheet Row Cardiac Rehab from 04/27/2023 in [facility] Cardiac and Pulmonary Rehab  Referring Provider Dr. [name]       Encounter Date: 07/08/2023  Check In:  Session Check In - 07/08/23 1131       Check-In   Supervising physician immediately available to respond to emergencies See telemetry face sheet for immediately available ER MD    Location [facility]-Cardiac & Pulmonary Rehab    Staff Present Susann Givens RN,BSN;Joseph [facility] Best, MS, Exercise Physiologist    Virtual Visit No    Medication changes reported     No    Fall or balance concerns reported    No    Warm-up and Cool-down Performed on first and last piece of equipment    Resistance Training Performed Yes    VAD Patient? No    PAD/SET Patient? No      Pain Assessment   Currently in Pain? No/denies                Social History   Tobacco Use  Smoking Status Former   Current packs/day: 0.00   Average packs/day: 1 pack/day for 62.0 years (62.0 ttl pk-yrs)   Types: Cigarettes   Start date: 01/13/1960   Quit date: 01/12/2022   Years since quitting: 1.4  Smokeless Tobacco Never    Goals Met:  Independence with exercise equipment Exercise tolerated well No report of concerns or symptoms today Strength training completed today  Goals Unmet:  Not Applicable  Comments: Pt able to follow exercise prescription today without complaint.  Will continue to monitor for progression.    Dr. Bethann Punches is Medical Director for [facility] Cardiac Rehabilitation.  Dr. Vida Rigger is Medical Director for [facility] Pulmonary Rehabilitation.

## 2023-07-09 NOTE — Telephone Encounter (Signed)
 Patient advised and scheduled. He would like to talk with Dr Linford Arnold about the low blood pressure issue.

## 2023-07-10 ENCOUNTER — Ambulatory Visit: Payer: Medicare Other

## 2023-07-15 ENCOUNTER — Ambulatory Visit (INDEPENDENT_AMBULATORY_CARE_PROVIDER_SITE_OTHER): Payer: Medicare Other | Admitting: Family Medicine

## 2023-07-15 ENCOUNTER — Encounter: Payer: Medicare Other | Attending: Cardiology

## 2023-07-15 VITALS — BP 121/60 | HR 74 | Ht 73.0 in | Wt 210.8 lb

## 2023-07-15 DIAGNOSIS — I252 Old myocardial infarction: Secondary | ICD-10-CM | POA: Insufficient documentation

## 2023-07-15 DIAGNOSIS — R221 Localized swelling, mass and lump, neck: Secondary | ICD-10-CM

## 2023-07-15 DIAGNOSIS — I10 Essential (primary) hypertension: Secondary | ICD-10-CM

## 2023-07-15 DIAGNOSIS — E119 Type 2 diabetes mellitus without complications: Secondary | ICD-10-CM

## 2023-07-15 DIAGNOSIS — Z951 Presence of aortocoronary bypass graft: Secondary | ICD-10-CM | POA: Insufficient documentation

## 2023-07-15 DIAGNOSIS — Z794 Long term (current) use of insulin: Secondary | ICD-10-CM

## 2023-07-15 DIAGNOSIS — D119 Benign neoplasm of major salivary gland, unspecified: Secondary | ICD-10-CM

## 2023-07-15 DIAGNOSIS — Z48812 Encounter for surgical aftercare following surgery on the circulatory system: Secondary | ICD-10-CM | POA: Insufficient documentation

## 2023-07-15 DIAGNOSIS — Z87891 Personal history of nicotine dependence: Secondary | ICD-10-CM | POA: Diagnosis not present

## 2023-07-15 NOTE — Progress Notes (Unsigned)
   Established Patient Office Visit  Subjective  Patient ID: Jon [name], male    DOB: 1945-09-19  Age: 78 y.o. MRN: [id number]  Chief Complaint  Patient presents with  . Hypotension  . low blood pressure reading at cardiac rehab    Today BP reading in office - left= 121/60 right = 108/61.   . Medication Problem    Stopped Trazodone about 90 days ago due to severe nightmares.     HPI  {History (Optional):23778}  ROS    Objective:     BP 121/60 (BP Location: Left Arm, Patient Position: Sitting) Comment: 108/61 right arm  Pulse 74   Ht 6\' 1"  (1.854 m)   Wt 210 lb 12 oz (95.6 kg)   SpO2 100%   BMI 27.81 kg/m  {Vitals History (Optional):23777}  Physical Exam   No results found for any visits on 07/15/23.  {Labs (Optional):23779}  The ASCVD Risk score (Arnett DK, et al., 2019) failed to calculate for the following reasons:   Risk score cannot be calculated because patient has a medical history suggesting prior/existing ASCVD    Assessment & Plan:   Problem List Items Addressed This Visit   None   No follow-ups on file.    Nani Gasser, MD

## 2023-07-16 ENCOUNTER — Encounter: Payer: Self-pay | Admitting: Family Medicine

## 2023-07-16 DIAGNOSIS — D119 Benign neoplasm of major salivary gland, unspecified: Secondary | ICD-10-CM | POA: Insufficient documentation

## 2023-07-16 DIAGNOSIS — Z87891 Personal history of nicotine dependence: Secondary | ICD-10-CM | POA: Insufficient documentation

## 2023-07-16 NOTE — Assessment & Plan Note (Signed)
 A1c looks great.  He is a few days early for his repeat A1c today.  So he should have a follow-up appointment we will keep that.  We can get his foot exam up-to-date at that point as well.

## 2023-07-16 NOTE — Assessment & Plan Note (Signed)
 Pressures are on the lower end.  He is only taking 25 mg of metoprolol and he is on [place].  For now we will continue to monitor he is asymptomatic.  We could consider cutting the metoprolol in half.  But being that he just had bypass this past year I would rather continue to monitor and maximize his therapy.  He is also on Plavix and atorvastatin

## 2023-07-17 ENCOUNTER — Ambulatory Visit: Payer: Medicare Other

## 2023-07-20 ENCOUNTER — Ambulatory Visit: Payer: Medicare Other | Admitting: Family Medicine

## 2023-07-22 ENCOUNTER — Encounter: Payer: Self-pay | Admitting: *Deleted

## 2023-07-22 ENCOUNTER — Encounter: Payer: Medicare Other | Admitting: *Deleted

## 2023-07-22 DIAGNOSIS — Z951 Presence of aortocoronary bypass graft: Secondary | ICD-10-CM

## 2023-07-22 NOTE — Progress Notes (Signed)
 Cardiac Individual Treatment Plan  Patient Details  Name: Jon [name] MRN: [id number] Date of Birth: 08-07-1945 Referring Provider:   Flowsheet Row Cardiac Rehab from 04/27/2023 in [facility] Cardiac and Pulmonary Rehab  Referring Provider Dr. [name]       Initial Encounter Date:  Flowsheet Row Cardiac Rehab from 04/27/2023 in [facility] Cardiac and Pulmonary Rehab  Date 04/27/23       Visit Diagnosis: S/P CABG x 3  Patient's Home Medications on Admission:  Current Outpatient Medications:    Accu-Chek Softclix Lancets lancets, Use to check blood sugars twice daily, Disp: 300 each, Rfl: 3   aspirin EC 81 MG tablet, Take 1 tablet (81 mg total) by mouth daily. Swallow whole., Disp: , Rfl:    atorvastatin (LIPITOR) 80 MG tablet, TAKE 1 TABLET BY MOUTH AT  BEDTIME, Disp: 100 tablet, Rfl: 2   Blood Glucose Monitoring Suppl (ACCU-CHEK GUIDE ME) w/Device KIT, Check glucose three times daily. Dx. Code E11.8, Disp: 1 kit, Rfl: 3   clopidogrel (PLAVIX) 75 MG tablet, Take 1 tablet (75 mg total) by mouth daily., Disp: 90 tablet, Rfl: 3   glucose blood (ACCU-CHEK GUIDE) test strip, Dx DM E11.9. Check fasting blood sugar every morning., Disp: 300 each, Rfl: PRN   insulin degludec (TRESIBA FLEXTOUCH) 100 UNIT/ML FlexTouch Pen, Inject 20 Units into the skin daily. (Patient taking differently: Inject 20 Units into the skin daily as needed (high blood sugar per patient).), Disp: 1 mL, Rfl: 0   metoprolol succinate (TOPROL-XL) 25 MG 24 hr tablet, Take 1 tablet (25 mg total) by mouth daily., Disp: 90 tablet, Rfl: 3   Semaglutide,0.25 or 0.5MG /DOS, 2 MG/3ML SOPN, Inject 0.5 mg into the skin every 7 (seven) days., Disp: , Rfl:    SYNJARDY 12.5-500 MG TABS, TAKE 1 TABLET BY MOUTH TWICE  DAILY, Disp: 180 tablet, Rfl: 3  Past Medical History: Past Medical History:  Diagnosis Date   BENIGN POSITIONAL VERTIGO 05/20/2010   Qualifier: Diagnosis of  By: Linford Arnold MD, Catherine     Centrilobular emphysema  [facility])    Cerebrovascular disease 07/15/2018   Prior CVA & TIA. Carotid CTA ~50% Bilateral ICA,  Multifocal severe stenosis of the left V2 vertebral artery.  Right vertebral artery patent without significant stenosis.   Coronary artery disease involving native coronary artery with angina pectoris (HCC) 11/18/2017   Severe LM, LAD & RCA stenosis indicated on Coronary CTA => CaCardiac Cath 01/02/2022: dLM ~50%; mLAD 85%@D1  90% (1,1,1) - m-dLAD 90$ @ 65% D2 (0,1,1), RI 75%, pLCx 30%-OM1 60%, pRCA 25% - p-mRCA hazy 85%. Normal LVEF by Echo, EDP 16 mmHg. = Best option CABGrdiac Cath    Current every day smoker    No interest in quitting   Diabetes mellitus, type II, insulin dependent (HCC)    With peripheral neuropathy, peripheral vascular disease and coronary artery disease as complications.   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 => 12/17/2021: L Subclavia A PTA=Stent with resolution of Sx.   TIA (transient ischemic attack) 11/11/2021   Brief episode of left sided arm pain and slurred speech    Tobacco Use: Social History   Tobacco Use  Smoking Status Former   Current packs/day: 0.00   Average packs/day: 1 pack/day for 62.0 years (62.0 ttl  pk-yrs)   Types: Cigarettes   Start date: 01/13/1960   Quit date: 01/12/2022   Years since quitting: 1.5  Smokeless Tobacco Never    Labs: Review Flowsheet  More data exists      Latest Ref Rng & Units 05/15/2022 12/04/2022 12/11/2022 01/30/2023 04/20/2023  Labs for ITP Cardiac and Pulmonary Rehab  Cholestrol 0 - 200 mg/dL - - 161  - -  LDL (calc) 0 - 99 mg/dL - - 51  - -  HDL-C >09 mg/dL - - 37  - -  Trlycerides <150 mg/dL - - 604  - -  Hemoglobin A1c 4.0 - 5.6 % 8.7  7.8  - - 6.3   PH, Arterial 7.35 - 7.45 - - 7.390  7.331   7.351  7.391  7.372  7.388  7.319  7.310  -  PCO2 arterial 32 - 48 mmHg - - 35.4  31.8  34.8  32.3  35.2  32.8  41.0  38.9  -  Bicarbonate 20.0 - 28.0 mmol/L - - 21.4  16.9  19.4  19.8  20.5  19.8  26.4  21.1  19.6  -  TCO2 22 - 32 mmol/L - - 22  18  20  21  22  25  21  24  23  28  22  21  21  23   -  Acid-base deficit 0.0 - 2.0 mmol/L - - 3.0  8.0  6.0  5.0  4.0  5.0  5.0  6.0  -  O2 Saturation % - - 95  99  99  99  100  100  83  100  100  -    Details       Multiple values from one day are sorted in reverse-chronological order          Exercise Target Goals: Exercise Program Goal: Individual exercise prescription set using results from initial 6 min walk test and THRR while considering  patient's activity barriers and safety.   Exercise Prescription Goal: Initial exercise prescription builds to 30-45 minutes a day of aerobic activity, 2-3 days per week.  Home exercise guidelines will be given to patient during program as part of exercise prescription that the participant will acknowledge.   Education: Aerobic Exercise: - Group verbal and visual presentation on the components of exercise prescription. Introduces F.I.T.T principle from ACSM for exercise prescriptions.  Reviews F.I.T.T. principles of aerobic exercise including progression. Written material given at graduation.   Education: Resistance Exercise: - Group verbal and visual presentation on the components of exercise prescription. Introduces F.I.T.T principle from ACSM for exercise prescriptions  Reviews F.I.T.T. principles of resistance exercise including progression. Written material given at graduation.    Education: Exercise & Equipment Safety: - Individual verbal instruction and demonstration of equipment use and safety with use of the equipment. Flowsheet Row Cardiac Rehab from 06/10/2023 in [facility] Cardiac and Pulmonary Rehab  Date 04/27/23  Educator [facility]  Instruction Review Code 1- Verbalizes Understanding        Education: Exercise Physiology & General Exercise Guidelines: - Group verbal and written instruction with models to review the exercise physiology of the cardiovascular system and associated critical values. Provides general exercise guidelines with specific guidelines to those with heart or lung disease.  Flowsheet Row Cardiac Rehab from 06/10/2023 in [facility] Cardiac and Pulmonary Rehab  Date 05/04/23  Educator [facility]  Instruction Review Code 1- [employer] Understanding       Education: Flexibility, Balance, Mind/Body Relaxation: - Group verbal  and visual presentation with interactive activity on the components of exercise prescription. Introduces F.I.T.T principle from ACSM for exercise prescriptions. Reviews F.I.T.T. principles of flexibility and balance exercise training including progression. Also discusses the mind body connection.  Reviews various relaxation techniques to help reduce and manage stress (i.e. Deep breathing, progressive muscle relaxation, and visualization). Balance handout provided to take home. Written material given at graduation.   Activity Barriers & Risk Stratification:  Activity Barriers & Cardiac Risk Stratification - 04/27/23 1543       Activity Barriers & Cardiac Risk Stratification   Activity Barriers Balance Concerns;Back Problems    Cardiac Risk Stratification High             6 Minute Walk:  6 Minute Walk     Row Name 04/27/23 1541         6 Minute Walk   Phase Initial     Distance 765 feet     Walk Time 6 minutes     # of Rest Breaks 0     MPH 1.45     METS 1.49     RPE 12     Perceived Dyspnea  1     VO2 Peak 5.2     Symptoms No     Resting HR 71 bpm     Resting BP 124/78     Resting Oxygen Saturation  98 %     Exercise Oxygen Saturation  during 6 min walk 99 %     Max Ex. HR 83 bpm     Max Ex. BP 120/82     2 Minute Post BP 122/82              Oxygen Initial Assessment:   Oxygen Re-Evaluation:   Oxygen Discharge  (Final Oxygen Re-Evaluation):   Initial Exercise Prescription:  Initial Exercise Prescription - 04/27/23 1500       Date of Initial Exercise RX and Referring Provider   Date 04/27/23    Referring Provider Dr. [name]      Oxygen   Maintain Oxygen Saturation 88% or higher      Treadmill   MPH 0.8    Grade 0    Minutes 15    METs 1.6      NuStep   Level 1    SPM 80    Minutes 15    METs 1.49      Arm Ergometer   Level 1    RPM 30    Minutes 15    METs 1.49      Track   Laps 10    Minutes 15    METs 1.54      Prescription Details   Frequency (times per week) 2    Duration Progress to 30 minutes of continuous aerobic without signs/symptoms of physical distress      Intensity   THRR 40-80% of Max Heartrate 100-129    Ratings of Perceived Exertion 11-13    Perceived Dyspnea 0-4      Progression   Progression Continue to progress workloads to maintain intensity without signs/symptoms of physical distress.      Resistance Training   Training Prescription Yes    Weight 4    Reps 10-15             Perform Capillary Blood Glucose checks as needed.  Exercise Prescription Changes:   Exercise Prescription Changes     Row Name 04/27/23 1500 05/21/23 1400 06/17/23 1000 06/17/23 1100 07/02/23  1400     Response to Exercise   Blood Pressure (Admit) 124/78 112/68 122/64 -- 114/60   Blood Pressure (Exercise) 120/82 108/58 134/68 -- 128/60   Blood Pressure (Exit) 122/82 112/56 110/60 -- 104/56   Heart Rate (Admit) 71 bpm 72 bpm 81 bpm -- 98 bpm   Heart Rate (Exercise) 83 bpm 98 bpm 106 bpm -- 109 bpm   Heart Rate (Exit) 71 bpm 70 bpm 79 bpm -- 77 bpm   Oxygen Saturation (Admit) 98 % -- -- -- --   Oxygen Saturation (Exercise) 99 % -- -- -- --   Oxygen Saturation (Exit) 99 % -- -- -- --   Rating of Perceived Exertion (Exercise) 12 11 13  -- 13   Perceived Dyspnea (Exercise) 1 0 0 -- 0   Symptoms none none none -- none   Comments 6 MWT resutls first two  weeks of exercise -- -- --   Duration -- Progress to 30 minutes of  aerobic without signs/symptoms of physical distress Progress to 30 minutes of  aerobic without signs/symptoms of physical distress Progress to 30 minutes of  aerobic without signs/symptoms of physical distress Progress to 30 minutes of  aerobic without signs/symptoms of physical distress   Intensity -- THRR unchanged THRR unchanged THRR unchanged THRR unchanged     Progression   Progression -- Continue to progress workloads to maintain intensity without signs/symptoms of physical distress. Continue to progress workloads to maintain intensity without signs/symptoms of physical distress. Continue to progress workloads to maintain intensity without signs/symptoms of physical distress. Continue to progress workloads to maintain intensity without signs/symptoms of physical distress.   Average METs -- 1.9 2.2 2.2 1.72     Resistance Training   Training Prescription -- Yes Yes Yes Yes   Weight -- 4 4 4 4    Reps -- 10-15 10-15 10-15 10-15     Interval Training   Interval Training -- No No No No     Treadmill   MPH -- -- 1.4 1.4 1.4   Grade -- -- 0 0 0   Minutes -- -- 15 15 15    METs -- -- 2.07 2.07 2.07     Recumbant Bike   Level -- -- 2 2 --   Watts -- -- 25 25 --   Minutes -- -- 15 15 --   METs -- -- 2.83 2.83 --     NuStep   Level -- 3 4 4  --   Minutes -- 15 15 15  --   METs -- 1.9 3.5 3.5 --     Arm Ergometer   Level -- -- -- -- 1   Minutes -- -- -- -- 15   METs -- -- -- -- 1.55     Biostep-RELP   Level -- -- -- -- 2   Minutes -- -- -- -- 15   METs -- -- -- -- 2     Track   Laps -- -- 10 10 11    Minutes -- -- 15 15 15    METs -- -- 1.54 1.54 1.6     Home Exercise Plan   Plans to continue exercise at -- -- -- [employer] (comment)  [facility] [employer] (comment[facility] [employer]   Frequency -- -- -- Add 2 additional days to program exercise sessions. Add 2 additional days to program  exercise sessions.   Initial Home Exercises Provided -- -- -- 06/17/23 06/17/23     Oxygen   Maintain Oxygen Saturation --  88% or higher 88% or higher 88% or higher 88% or higher    Row Name 07/16/23 0700             Response to Exercise   Blood Pressure (Admit) 118/62       Blood Pressure (Exercise) 120/68       Blood Pressure (Exit) 92/58       Heart Rate (Admit) 67 bpm       Heart Rate (Exercise) 97 bpm       Heart Rate (Exit) 76 bpm       Rating of Perceived Exertion (Exercise) 13       Symptoms none       Duration Continue with 30 min of aerobic exercise without signs/symptoms of physical distress.       Intensity THRR unchanged         Progression   Progression Continue to progress workloads to maintain intensity without signs/symptoms of physical distress.       Average METs 1.89         Resistance Training   Training Prescription Yes       Weight 4       Reps 10-15         Interval Training   Interval Training No         Treadmill   MPH 1.3       Grade 0       Minutes 15       METs 1.99         Recumbant Bike   Level 2       Watts 16       Minutes 15         NuStep   Level 3       Minutes 15       METs 2.2         Arm Ergometer   Level 1       Minutes 15       METs 1.54         T5 Nustep   Level 4  T6       SPM 80       Minutes 15       METs 2.3         Biostep-RELP   Level 1       Minutes 15       METs 2         Track   Laps 11       Minutes 15       METs 1.6         Home Exercise Plan   Plans to continue exercise at [employer] (comment)  [employer]       Frequency Add 2 additional days to program exercise sessions.       Initial Home Exercises Provided 06/17/23         Oxygen   Maintain Oxygen Saturation 88% or higher                Exercise Comments:   Exercise Comments     Row Name 05/04/23 1335           Exercise Comments First full day of exercise!  Patient was oriented to gym and equipment  including functions, settings, policies, and procedures.  Patient's individual exercise prescription and treatment plan were reviewed.  All starting workloads were established based on the results of the 6 minute walk test done at  initial orientation visit.  The plan for exercise progression was also introduced and progression will be customized based on patient's performance and goals.                Exercise Goals and Review:   Exercise Goals     Row Name 04/27/23 1550             Exercise Goals   Increase Physical Activity Yes       Intervention Provide advice, education, support and counseling about physical activity/exercise needs.;Develop an individualized exercise prescription for aerobic and resistive training based on initial evaluation findings, risk stratification, comorbidities and participant's personal goals.       Expected Outcomes Short Term: Attend rehab on a regular basis to increase amount of physical activity.;Long Term: Add in home exercise to make exercise part of routine and to increase amount of physical activity.;Long Term: Exercising regularly at least 3-5 days a week.       Increase Strength and Stamina Yes       Intervention Provide advice, education, support and counseling about physical activity/exercise needs.;Develop an individualized exercise prescription for aerobic and resistive training based on initial evaluation findings, risk stratification, comorbidities and participant's personal goals.       Expected Outcomes Short Term: Increase workloads from initial exercise prescription for resistance, speed, and METs.;Short Term: Perform resistance training exercises routinely during rehab and add in resistance training at home;Long Term: Improve cardiorespiratory fitness, muscular endurance and strength as measured by increased METs and functional capacity ( )       Able to understand and use rate of perceived exertion (RPE) scale Yes       Intervention  Provide education and explanation on how to use RPE scale       Expected Outcomes Short Term: Able to use RPE daily in rehab to express subjective intensity level;Long Term:  Able to use RPE to guide intensity level when exercising independently       Able to understand and use Dyspnea scale Yes       Intervention Provide education and explanation on how to use Dyspnea scale       Expected Outcomes Short Term: Able to use Dyspnea scale daily in rehab to express subjective sense of shortness of breath during exertion;Long Term: Able to use Dyspnea scale to guide intensity level when exercising independently       Knowledge and understanding of Target Heart Rate Range (THRR) Yes       Intervention Provide education and explanation of THRR including how the numbers were predicted and where they are located for reference       Expected Outcomes Short Term: Able to state/look up THRR;Long Term: Able to use THRR to govern intensity when exercising independently;Short Term: Able to use daily as guideline for intensity in rehab       Able to check pulse independently Yes       Intervention Provide education and demonstration on how to check pulse in carotid and radial arteries.;Review the importance of being able to check your own pulse for safety during independent exercise       Expected Outcomes Short Term: Able to explain why pulse checking is important during independent exercise;Long Term: Able to check pulse independently and accurately       Understanding of Exercise Prescription Yes       Intervention Provide education, explanation, and written materials on patient's individual exercise prescription       Expected Outcomes Short  Term: Able to explain program exercise prescription;Long Term: Able to explain home exercise prescription to exercise independently                Exercise Goals Re-Evaluation :  Exercise Goals Re-Evaluation     Row Name 05/04/23 1335 05/21/23 1429 06/17/23 1056  06/17/23 1134 06/29/23 1300     Exercise Goal Re-Evaluation   Exercise Goals Review Able to understand and use rate of perceived exertion (RPE) scale;Knowledge and understanding of Target Heart Rate Range (THRR);Able to understand and use Dyspnea scale;Understanding of Exercise Prescription Increase Physical Activity;Increase Strength and Stamina;Understanding of Exercise Prescription Increase Physical Activity;Increase Strength and Stamina;Understanding of Exercise Prescription Increase Strength and Stamina;Increase Physical Activity;Able to understand and use rate of perceived exertion (RPE) scale;Able to understand and use Dyspnea scale;Knowledge and understanding of Target Heart Rate Range (THRR);Able to check pulse independently;Understanding of Exercise Prescription Increase Physical Activity;Understanding of Exercise Prescription   Comments Reviewed RPE and dyspnea scale, THR and program prescription with pt today.  Pt voiced understanding and was given a copy of goals to take home. Dorinda Hill is off to a great start in the program. He was only able to attend one session during this review, but he was able to increase his level on the T4 nustep from 1 to 3. We will continue to monitor his progress in the program. Dorinda Hill is doing well in rehab. He was able to increase his speed on the treadmill to 1.21mph. He was also able to increase his level on the T4 nustep from level 3 to 4. We will continue to monitor his progress in the program. Reviewed home exercise with pt today.  Pt plans to go to [employer] for exercise.  Reviewed THR, pulse, RPE, sign and symptoms, pulse oximetery and when to call 911 or MD.  Also discussed weather considerations and indoor options.  Pt voiced understanding. Don's home exedrcise was reviewed with him recently. He has not been able to add an extra 1-2 days yet but is planning to soon once the weather is better. He does not like to get out when it is too cold or rainy/snowy.    Expected Outcomes Short: Use RPE daily to regulate intensity. Long: Follow program prescription in THR. Short: Continue to follow current exercise prescription. Long: Continue exercise to improve strength and stamina. Short: Continue to follow current exercise prescription. Long: Continue exercise to improve strength and stamina. Short: add 1-2 days a week of exercise at community facility on of days of cardiac rehab. Long: maintain independent exercise routine upon graduation from cardiac rehab. Short: add 1-2 days a week of exercise at [employer]. Long: independently manage exercise routine.    Row Name 07/02/23 1411 07/16/23 0747           Exercise Goal Re-Evaluation   Exercise Goals Review Increase Physical Activity;Understanding of Exercise Prescription;Increase Strength and Stamina Increase Physical Activity;Understanding of Exercise Prescription;Increase Strength and Stamina      Comments Roe Coombs is doing well in rehab. He was able to add the biostep at a level of 2 to his current exercise prescription. He was also able to maintain his workload on the treadmill at a speed of 1. and no incline. We will continue to monitor his progress in the program. Dorinda Hill is doing well in rehab. He was able to maintain level 2 on the recumbent bike, level 4 on the T6 nustep, 11 laps on the track, and level 1 on the arm ergometer. We will continue  to monitor his progress in the program.      Expected Outcomes Short: Continue to increase treadmill workload. Long: Continue exercise to improve strength and stamina. Short: Try level 3 on the recumbent bike and to push for more laps on the track. Long: Continue exercise to improve strength and stamina.               Discharge Exercise Prescription (Final Exercise Prescription Changes):  Exercise Prescription Changes - 07/16/23 0700       Response to Exercise   Blood Pressure (Admit) 118/62    Blood Pressure (Exercise) 120/68    Blood Pressure (Exit)  92/58    Heart Rate (Admit) 67 bpm    Heart Rate (Exercise) 97 bpm    Heart Rate (Exit) 76 bpm    Rating of Perceived Exertion (Exercise) 13    Symptoms none    Duration Continue with 30 min of aerobic exercise without signs/symptoms of physical distress.    Intensity THRR unchanged      Progression   Progression Continue to progress workloads to maintain intensity without signs/symptoms of physical distress.    Average METs 1.89      Resistance Training   Training Prescription Yes    Weight 4    Reps 10-15      Interval Training   Interval Training No      Treadmill   MPH 1.3    Grade 0    Minutes 15    METs 1.99      Recumbant Bike   Level 2    Watts 16    Minutes 15      NuStep   Level 3    Minutes 15    METs 2.2      Arm Ergometer   Level 1    Minutes 15    METs 1.54      T5 Nustep   Level 4   T6   SPM 80    Minutes 15    METs 2.3      Biostep-RELP   Level 1    Minutes 15    METs 2      Track   Laps 11    Minutes 15    METs 1.6      Home Exercise Plan   Plans to continue exercise at [employer] (comment)   [facility]   Frequency Add 2 additional days to program exercise sessions.    Initial Home Exercises Provided 06/17/23      Oxygen   Maintain Oxygen Saturation 88% or higher             Nutrition:  Target Goals: Understanding of nutrition guidelines, daily intake of sodium 1500mg , cholesterol 200mg , calories 30% from fat and 7% or less from saturated fats, daily to have 5 or more servings of fruits and vegetables.  Education: All About Nutrition: -Group instruction provided by verbal, written material, interactive activities, discussions, models, and posters to present general guidelines for heart healthy nutrition including fat, fiber, MyPlate, the role of sodium in heart healthy nutrition, utilization of the nutrition label, and utilization of this knowledge for meal planning. Follow up email sent as well. Written  material given at graduation.   Biometrics:  Pre Biometrics - 04/27/23 1546       Pre Biometrics   Height 6\' 1"  (1.854 m)    Weight 209 lb 12.8 oz (95.2 kg)    Waist Circumference 43.5 inches    Hip Circumference  41 inches    Waist to Hip Ratio 1.06 %    BMI (Calculated) 27.69    Single Leg Stand 2.12 seconds              Nutrition Therapy Plan and Nutrition Goals:  Nutrition Therapy & Goals - 06/29/23 1304       Nutrition Therapy   RD appointment deferred Yes             Nutrition Assessments:  MEDIFICTS Score Key: >=70 Need to make dietary changes  40-70 Heart Healthy Diet <= 40 Therapeutic Level Cholesterol Diet  Flowsheet Row Cardiac Rehab from 05/04/2023 in [facility] Cardiac and Pulmonary Rehab  Picture Your Plate Total Score on Admission 44      Picture Your Plate Scores: <41 Unhealthy dietary pattern with much room for improvement. 41-50 Dietary pattern unlikely to meet recommendations for good health and room for improvement. 51-60 More healthful dietary pattern, with some room for improvement.  >60 Healthy dietary pattern, although there may be some specific behaviors that could be improved.    Nutrition Goals Re-Evaluation:   Nutrition Goals Discharge (Final Nutrition Goals Re-Evaluation):   Psychosocial: Target Goals: Acknowledge presence or absence of significant depression and/or stress, maximize coping skills, provide positive support system. Participant is able to verbalize types and ability to use techniques and skills needed for reducing stress and depression.   Education: Stress, Anxiety, and Depression - Group verbal and visual presentation to define topics covered.  Reviews how body is impacted by stress, anxiety, and depression.  Also discusses healthy ways to reduce stress and to treat/manage anxiety and depression.  Written material given at graduation.   Education: Sleep Hygiene -Provides group verbal and written instruction  about how sleep can affect your health.  Define sleep hygiene, discuss sleep cycles and impact of sleep habits. Review good sleep hygiene tips.    Initial Review & Psychosocial Screening:  Initial Psych Review & Screening - 04/23/23 1502       Initial Review   Current issues with Current Stress Concerns    Source of Stress Concerns Unable to participate in former interests or hobbies;Unable to perform yard/household activities      Family Dynamics   Good Support System? Yes      Barriers   Psychosocial barriers to participate in program There are no identifiable barriers or psychosocial needs.;The patient should benefit from training in stress management and relaxation.      Screening Interventions   Interventions Encouraged to exercise;Provide feedback about the scores to participant;To provide support and resources with identified psychosocial needs    Expected Outcomes Short Term goal: Utilizing psychosocial counselor, staff and physician to assist with identification of specific Stressors or current issues interfering with healing process. Setting desired goal for each stressor or current issue identified.;Long Term Goal: Stressors or current issues are controlled or eliminated.;Short Term goal: Identification and review with participant of any Quality of Life or Depression concerns found by scoring the questionnaire.;Long Term goal: The participant improves quality of Life and PHQ9 Scores as seen by post scores and/or verbalization of changes             Quality of Life Scores:   Quality of Life - 05/04/23 1422       Quality of Life   Select Quality of Life      Quality of Life Scores   Health/Function Pre 23.09 %    Socioeconomic Pre 22.32 %    Psych/Spiritual Pre 23.5 %  Family Pre 21.07 %    GLOBAL Pre 28.5 %            Scores of 19 and below usually indicate a poorer quality of life in these areas.  A difference of  2-3 points is a clinically meaningful  difference.  A difference of 2-3 points in the total score of the Quality of Life Index has been associated with significant improvement in overall quality of life, self-image, physical symptoms, and general health in studies assessing change in quality of life.  PHQ-9: Review Flowsheet  More data exists      07/15/2023 04/27/2023 12/04/2022 08/01/2022 01/20/2022  Depression screen PHQ 2/9  Decreased Interest 0 0 0 0 0  Down, Depressed, Hopeless 0 0 0 0 0  PHQ - 2 Score 0 0 0 0 0  Altered sleeping - 1 - - -  Tired, decreased energy - 1 - - -  Change in appetite - 0 - - -  Feeling bad or failure about yourself  - 0 - - -  Trouble concentrating - 0 - - -  Moving slowly or fidgety/restless - 0 - - -  Suicidal thoughts - 0 - - -  PHQ-9 Score - 2 - - -  Difficult doing work/chores - Not difficult at all - - -   Interpretation of Total Score  Total Score Depression Severity:  1-4 = Minimal depression, 5-9 = Mild depression, 10-14 = Moderate depression, 15-19 = Moderately severe depression, 20-27 = Severe depression   Psychosocial Evaluation and Intervention:  Psychosocial Evaluation - 04/23/23 1506       Psychosocial Evaluation & Interventions   Interventions Encouraged to exercise with the program and follow exercise prescription    Comments Mr. 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 He has had a long recovery related to balance and walking. He has spent 22 days in a rehab facility and completed [facility] rehab. He is hesitant about the program because he is unsure what more rehab will do for him. We discussed how this is a more independent program that will help him strengthen his balance and regain muscle. He wants to work on his independence so he can maintain it.    Expected Outcomes Short: attend cardiac rehab for education and exercise. Long: develop and maintain positive self care habits    Continue Psychosocial Services  Follow up required by staff              Psychosocial Re-Evaluation:  Psychosocial Re-Evaluation     Row Name 06/29/23 1304             Psychosocial Re-Evaluation   Current issues with Current Stress Concerns       Comments Roe Coombs states he is still attending the program because he is hopeful it will help him feel better. Some of his initial concerns about the program have been resolved as he is more inclined to attend the program since it is helping his independence. He still has health concerns that have not resolved but is planning on talking to his pcp in March. Discussed importance of education in program to help with maintaining a positive life balance       Expected Outcomes Short: attend program consistently for  maximum benefit  Long: manage mental health habits       Continue Psychosocial Services  Follow up required by staff                Psychosocial  Discharge (Final Psychosocial Re-Evaluation):  Psychosocial Re-Evaluation - 06/29/23 1304       Psychosocial Re-Evaluation   Current issues with Current Stress Concerns    Comments Roe Coombs states he is still attending the program because he is hopeful it will help him feel better. Some of his initial concerns about the program have been resolved as he is more inclined to attend the program since it is helping his independence. He still has health concerns that have not resolved but is planning on talking to his pcp in March. Discussed importance of education in program to help with maintaining a positive life balance    Expected Outcomes Short: attend program consistently for  maximum benefit  Long: manage mental health habits    Continue Psychosocial Services  Follow up required by staff             Vocational Rehabilitation: Provide vocational rehab assistance to qualifying candidates.   Vocational Rehab Evaluation & Intervention:  Vocational Rehab - 04/23/23 1502       Initial Vocational Rehab Evaluation & Intervention   Assessment shows need for  Vocational Rehabilitation No             Education: Education Goals: Education classes will be provided on a variety of topics geared toward better understanding of heart health and risk factor modification. Participant will state understanding/return demonstration of topics presented as noted by education test scores.  Learning Barriers/Preferences:  Learning Barriers/Preferences - 04/23/23 1502       Learning Barriers/Preferences   Learning Barriers None    Learning Preferences Individual Instruction             General Cardiac Education Topics:  AED/CPR: - Group verbal and written instruction with the use of models to demonstrate the basic use of the AED with the basic ABC's of resuscitation.   Anatomy and Cardiac Procedures: - Group verbal and visual presentation and models provide information about basic cardiac anatomy and function. Reviews the testing methods done to diagnose heart disease and the outcomes of the test results. Describes the treatment choices: Medical Management, Angioplasty, or Coronary Bypass Surgery for treating various heart conditions including Myocardial Infarction, Angina, Valve Disease, and Cardiac Arrhythmias.  Written material given at graduation. Flowsheet Row Cardiac Rehab from 06/10/2023 in [facility] Cardiac and Pulmonary Rehab  Date 06/10/23  Educator sb  Instruction Review Code 1- Verbalizes Understanding       Medication Safety: - Group verbal and visual instruction to review commonly prescribed medications for heart and lung disease. Reviews the medication, class of the drug, and side effects. Includes the steps to properly store meds and maintain the prescription regimen.  Written material given at graduation.   Intimacy: - Group verbal instruction through game format to discuss how heart and lung disease can affect sexual intimacy. Written material given at graduation..   Know Your Numbers and Heart Failure: - Group verbal and  visual instruction to discuss disease risk factors for cardiac and pulmonary disease and treatment options.  Reviews associated critical values for Overweight/Obesity, Hypertension, Cholesterol, and Diabetes.  Discusses basics of heart failure: signs/symptoms and treatments.  Introduces Heart Failure Zone chart for action plan for heart failure.  Written material given at graduation.   Infection Prevention: - Provides verbal and written material to individual with discussion of infection control including proper hand washing and proper equipment cleaning during exercise session. Flowsheet Row Cardiac Rehab from 06/10/2023 in [facility] Cardiac and Pulmonary Rehab  Date 04/27/23  Educator [facility]  Instruction Review Code 1- Verbalizes Understanding       Falls Prevention: - Provides verbal and written material to individual with discussion of falls prevention and safety. Flowsheet Row Cardiac Rehab from 06/10/2023 in [facility] Cardiac and Pulmonary Rehab  Date 04/27/23  Educator [facility]  Instruction Review Code 1- Verbalizes Understanding       Other: -Provides group and verbal instruction on various topics (see comments)   Knowledge Questionnaire Score:  Knowledge Questionnaire Score - 05/04/23 1424       Knowledge Questionnaire Score   Pre Score 22/26nutrition, exercie   HF             Core Components/Risk Factors/Patient Goals at Admission:  Personal Goals and Risk Factors at Admission - 04/27/23 1548       Core Components/Risk Factors/Patient Goals on Admission    Weight Management Yes;Weight Maintenance    Intervention Weight Management: Develop a combined nutrition and exercise program designed to reach desired caloric intake, while maintaining appropriate intake of nutrient and fiber, sodium and fats, and appropriate energy expenditure required for the weight goal.;Weight Management: Provide education and appropriate resources to help participant work on and attain dietary goals.;Weight  Management/Obesity: Establish reasonable short term and long term weight goals.    Admit Weight 209 lb 12.8 oz (95.2 kg)    Goal Weight: Short Term 210 lb (95.3 kg)    Goal Weight: Long Term 210 lb (95.3 kg)    Expected Outcomes Short Term: Continue to assess and modify interventions until short term weight is achieved;Long Term: Adherence to nutrition and physical activity/exercise program aimed toward attainment of established weight goal;Weight Maintenance: Understanding of the daily nutrition guidelines, which includes 25-35% calories from fat, 7% or less cal from saturated fats, less than 200mg  cholesterol, less than 1.5gm of sodium, & 5 or more servings of fruits and vegetables daily;Understanding recommendations for meals to include 15-35% energy as protein, 25-35% energy from fat, 35-60% energy from carbohydrates, less than 200mg  of dietary cholesterol, 20-35 gm of total fiber daily;Understanding of distribution of calorie intake throughout the day with the consumption of 4-5 meals/snacks    Diabetes Yes    Intervention Provide education about proper nutrition, including hydration, and aerobic/resistive exercise prescription along with prescribed medications to achieve blood glucose in normal ranges: Fasting glucose 65-99 mg/dL;Provide education about signs/symptoms and action to take for hypo/hyperglycemia.    Expected Outcomes Short Term: Participant verbalizes understanding of the signs/symptoms and immediate care of hyper/hypoglycemia, proper foot care and importance of medication, aerobic/resistive exercise and nutrition plan for blood glucose control.;Long Term: Attainment of HbA1C < 7%.    Hypertension Yes    Intervention Provide education on lifestyle modifcations including regular physical activity/exercise, weight management, moderate sodium restriction and increased consumption of fresh fruit, vegetables, and low fat dairy, alcohol moderation, and smoking cessation.;Monitor prescription  use compliance.    Expected Outcomes Short Term: Continued assessment and intervention until BP is < 140/72mm HG in hypertensive participants. < 130/20mm HG in hypertensive participants with diabetes, heart failure or chronic kidney disease.;Long Term: Maintenance of blood pressure at goal levels.             Education:Diabetes - Individual verbal and written instruction to review signs/symptoms of diabetes, desired ranges of glucose level fasting, after meals and with exercise. Acknowledge that pre and post exercise glucose checks will be done for 3 sessions at entry of program. Flowsheet Row Cardiac Rehab from 06/10/2023 in [facility] Cardiac and Pulmonary Rehab  Date 04/27/23  Educator [facility]  Instruction Review Code 1- Verbalizes Understanding       Core Components/Risk Factors/Patient Goals Review:   Goals and Risk Factor Review     Row Name 06/29/23 1310             Core Components/Risk Factors/Patient Goals Review   Personal Goals Review Hypertension;Diabetes       Review Roe Coombs states his blood pressure has been stable since his CABG. He is no longer on blood presure medication. His CBG has been consistent and he has been sticking to a consistent diet as well as adhering to his medications. His biggest concern is when he walks sometimes he feels "wobbly in the head" He has been encouraged to check his blood pressure during these spells as well as his blood sugar to help his doctor rule out certain things. He says he will bring a log to his doctor appointment in March       Expected Outcomes Short: keep log of blood pressures and blood sugars during these spells. Long: independenlty manage risk factors.                Core Components/Risk Factors/Patient Goals at Discharge (Final Review):   Goals and Risk Factor Review - 06/29/23 1310       Core Components/Risk Factors/Patient Goals Review   Personal Goals Review Hypertension;Diabetes    Review Roe Coombs states his blood pressure has  been stable since his CABG. He is no longer on blood presure medication. His CBG has been consistent and he has been sticking to a consistent diet as well as adhering to his medications. His biggest concern is when he walks sometimes he feels "wobbly in the head" He has been encouraged to check his blood pressure during these spells as well as his blood sugar to help his doctor rule out certain things. He says he will bring a log to his doctor appointment in March    Expected Outcomes Short: keep log of blood pressures and blood sugars during these spells. Long: independenlty manage risk factors.             ITP Comments:  ITP Comments     Row Name 04/23/23 1500 04/27/23 1541 04/29/23 0944 05/04/23 1335 05/27/23 1251   ITP Comments Initial phone call completed. Diagnosis can be found in [facility] 12/4. EP Orientation scheduled for Monday 12/16 at 1:30. Completed and gym orientation. Initial ITP created and sent for review to Dr. Bethann Punches, Medical Director. 30 Day review completed. Medical Director ITP review done, changes made as directed, and signed approval by Medical Director.    new to program First full day of exercise!  Patient was oriented to gym and equipment including functions, settings, policies, and procedures.  Patient's individual exercise prescription and treatment plan were reviewed.  All starting workloads were established based on the results of the 6 minute walk test done at initial orientation visit.  The plan for exercise progression was also introduced and progression will be customized based on patient's performance and goals. 30 Day review completed. Medical Director ITP review done, changes made as directed, and signed approval by Medical Director.    new to program    Row Name 06/24/23 0750 07/22/23 0856         ITP Comments 30 Day review completed. Medical Director ITP review done, changes made as directed, and signed approval by Medical Director. 30 Day review  completed. Medical Director ITP review done, changes made as directed, and  signed approval by Medical Director.               Comments:

## 2023-07-24 ENCOUNTER — Ambulatory Visit: Payer: Medicare Other

## 2023-07-24 ENCOUNTER — Telehealth: Payer: Self-pay | Admitting: *Deleted

## 2023-07-24 NOTE — Telephone Encounter (Signed)
 Called to check on Jon [name]. Has been out since 2/26 no answer left message on voicemail

## 2023-07-31 ENCOUNTER — Ambulatory Visit: Payer: Medicare Other

## 2023-08-03 ENCOUNTER — Encounter: Payer: Medicare Other | Admitting: *Deleted

## 2023-08-07 ENCOUNTER — Ambulatory Visit: Payer: Medicare Other

## 2023-08-07 ENCOUNTER — Telehealth: Payer: Self-pay | Admitting: *Deleted

## 2023-08-07 NOTE — Telephone Encounter (Signed)
 Called to check on Jon [name].   "Housekeeper" stated he is out of town and will be gone at least another 10 days.

## 2023-08-10 ENCOUNTER — Other Ambulatory Visit: Payer: Self-pay | Admitting: Family Medicine

## 2023-08-10 NOTE — Telephone Encounter (Signed)
 Patient has refills at the pharmacy. I called and confirmed. Patient advised.

## 2023-08-10 NOTE — Telephone Encounter (Signed)
 Copied from CRM [id number]. Topic: Clinical - Medication Refill >> Aug 10, 2023 10:35 AM Yvone Neu wrote: Most Recent Primary Care Visit:  Provider: Nani Gasser D  Department: [facility]  Visit Type: OFFICE VISIT  Date: 07/15/2023  Medication: clopidogrel (PLAVIX) 75 MG tablet  Has the patient contacted their pharmacy? Yes (Agent: If no, request that the patient contact the pharmacy for the refill. If patient does not wish to contact the pharmacy document the reason why and proceed with request.) (Agent: If yes, when and what did the pharmacy advise?)  Is this the correct pharmacy for this prescription? Yes If no, delete pharmacy and type the correct one.  This is the patient's preferred pharmacy:  [facility] [address]), [place] - [address] [place]) [place] [address] Phone: [phone] Fax: [phone]  [facility] - [place], [place] - [address] [place] [address] Phone: [phone] Fax: [phone]  [facility] - [facility] [address] [place] [address] Phone: [phone] Fax: [phone]  [facility] [address] [place] [address] Phone: [phone] Fax: [phone]   Has the prescription been filled recently? No  Is the patient out of the medication? Yes  Has the patient been seen for an appointment in the last year OR does the patient have an upcoming appointment? Yes  Can we respond through MyChart? Yes  Agent: Please be advised that Rx refills may take up to 3 business days. We ask that you follow-up with your pharmacy.

## 2023-08-14 ENCOUNTER — Ambulatory Visit: Payer: Medicare Other

## 2023-08-17 ENCOUNTER — Encounter: Payer: Self-pay | Admitting: *Deleted

## 2023-08-17 ENCOUNTER — Ambulatory Visit (INDEPENDENT_AMBULATORY_CARE_PROVIDER_SITE_OTHER): Payer: Medicare Other | Admitting: Family Medicine

## 2023-08-17 ENCOUNTER — Encounter: Payer: Self-pay | Admitting: Family Medicine

## 2023-08-17 VITALS — BP 119/75 | HR 69 | Ht 73.0 in | Wt 212.2 lb

## 2023-08-17 DIAGNOSIS — Z794 Long term (current) use of insulin: Secondary | ICD-10-CM | POA: Diagnosis not present

## 2023-08-17 DIAGNOSIS — Z1211 Encounter for screening for malignant neoplasm of colon: Secondary | ICD-10-CM

## 2023-08-17 DIAGNOSIS — I10 Essential (primary) hypertension: Secondary | ICD-10-CM | POA: Diagnosis not present

## 2023-08-17 DIAGNOSIS — R413 Other amnesia: Secondary | ICD-10-CM | POA: Insufficient documentation

## 2023-08-17 DIAGNOSIS — E119 Type 2 diabetes mellitus without complications: Secondary | ICD-10-CM | POA: Diagnosis not present

## 2023-08-17 DIAGNOSIS — R531 Weakness: Secondary | ICD-10-CM | POA: Diagnosis not present

## 2023-08-17 DIAGNOSIS — Z951 Presence of aortocoronary bypass graft: Secondary | ICD-10-CM

## 2023-08-17 LAB — POCT GLYCOSYLATED HEMOGLOBIN (HGB A1C): Hemoglobin A1C: 6.2 % — AB (ref 4.0–5.6)

## 2023-08-17 MED ORDER — SEMAGLUTIDE (1 MG/DOSE) 4 MG/3ML ~~LOC~~ SOPN
1.0000 mg | PEN_INJECTOR | SUBCUTANEOUS | 1 refills | Status: AC
Start: 2023-08-17 — End: ?

## 2023-08-17 NOTE — Assessment & Plan Note (Signed)
BP looks great today!! 

## 2023-08-17 NOTE — Assessment & Plan Note (Signed)
 MOCA score 24/30 This is a baseline that we can now follow.  He did have an MRI done in 11/2021: IMPRESSION: 1. No acute intracranial abnormality. 2. 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 which showed  This certainly could be contributing he could have some element of vascular impairment.  But also could be secondary to recent statin increased since his heart attack.  Again wanted to do a trial of a lower dose just to see if it makes a difference.  We could always change statins and give that a trial as well.

## 2023-08-17 NOTE — Progress Notes (Signed)
 Established Patient Office Visit  Subjective  Patient ID: Jon [name], male    DOB: 06-13-1945  Age: 78 y.o. MRN: [id number]  Chief Complaint  Patient presents with   Medical Management of Chronic Issues    T2DM last A1C 6.3    HPI Here today for diabetes follow-up.  His original appointment was in March and we did reschedule.  Has been going to cardiac rehab.  He has had about 3 visits since I last saw him.  Blood pressures would occasionally drop at rehab.  He does report problems with sometimes finishing his sentences and finishing his thoughts so kind of lose the conversation.  He often forgets one of his grandchildren's name.  He did have a brain MRI back in July 2023 showing no acute abnormalities but just the multiple old small vessel infarcts and findings of chronic ischemic microangiopathy. V   States he still struggling with some nightmares in fact he had stopped his trazodone back in December because of the nightmares because he felt like they were getting worse with it.  He says since recently moving he actually feels like they have gotten a little bit better but wanted to know if he could try taking maybe half or third of a THC gummy.  Thus what his wife takes.  Did get married since I last saw him.  Last time he was here he mentioned pain that was intermittent but sharp on the left side of his neck/jaw.  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  He says they never contacted him to schedule is actually hoping he was going to have that done before he came back in today.  Today though he says he is not having any problems at all with his neck.  He is also complaining of weakness in his upper legs.  He says usually within a couple minutes of standing up they just feel tired and weak he says it does not happen every day but happens maybe 3 to 4 days out of the week.    ROS    Objective:     BP 119/75  (BP Location: Left Arm, Patient Position: Sitting, Cuff Size: Large)   Pulse 69   Ht 6\' 1"  (1.854 m)   Wt 212 lb 4 oz (96.3 kg)   SpO2 95%   BMI 28.00 kg/m    Physical Exam Vitals and nursing note reviewed.  Constitutional:      Appearance: Normal appearance.  HENT:     Head: Normocephalic and atraumatic.  Eyes:     Conjunctiva/sclera: Conjunctivae normal.  Cardiovascular:     Rate and Rhythm: Normal rate and regular rhythm.  Pulmonary:     Effort: Pulmonary effort is normal.     Breath sounds: Normal breath sounds.  Skin:    General: Skin is warm and dry.  Neurological:     Mental Status: He is alert.  Psychiatric:        Mood and Affect: Mood normal.      Results for orders placed or performed in visit on 08/17/23  POCT HgB A1C  Result Value Ref Range   Hemoglobin A1C 6.2 (A) 4.0 - 5.6 %   HbA1c POC (<> result, manual entry)     HbA1c, POC (prediabetic range)     HbA1c, POC (controlled diabetic range)        The ASCVD Risk score (Arnett DK, et  al., 2019) failed to calculate for the following reasons:   Risk score cannot be calculated because patient has a medical history suggesting prior/existing ASCVD    Assessment & Plan:   Problem List Items Addressed This Visit       Cardiovascular and Mediastinum   Essential hypertension - Primary (Chronic)   BP looks great today.         Endocrine   Diabetes mellitus, type II, insulin dependent (HCC)   A1c looks good today at 6.2.  Will try increasing the Ozempic to 1 mg if he tolerates that well that I am hopeful we can decrease and maybe even get rid of his [place].  I will update his prescription.  Can you Synjardy as well.  Lab Results  Component Value Date   HGBA1C 6.2 (A) 08/17/2023         Relevant Medications   Semaglutide, 1 MG/DOSE, 4 MG/3ML SOPN   Other Relevant Orders   POCT HgB A1C (Completed)     Other   Weakness generalized   Gust cutting his statin in half for about 10 to 14 days to  see if he notices a difference.  We could also consider doing a trial of a dense different statin just to see if that could be contributing.  He does have significant atrophy of his calves.      S/P CABG x 3   Discussed that it is important to stay on a statin but I do want to decrease his dose just for a couple of weeks to see if it makes a difference with his short-term memory and with his muscle weakness.      Memory impairment   MOCA score 24/30 This is a baseline that we can now follow.  He did have an MRI done in 11/2021: IMPRESSION: 1. No acute intracranial abnormality. 2. 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 which showed  This certainly could be contributing he could have some element of vascular impairment.  But also could be secondary to recent statin increased since his heart attack.  Again wanted to do a trial of a lower dose just to see if it makes a difference.  We could always change statins and give that a trial as well.      Other Visit Diagnoses       Screen for colon cancer       Relevant Orders   Cologuard       Return in about 14 weeks (around 11/23/2023) for Diabetes follow-up.   I spent 40 minutes on the day of the encounter to include pre-visit record review, face-to-face time with the patient and post visit ordering of test.  Nani Gasser, MD

## 2023-08-17 NOTE — Assessment & Plan Note (Signed)
 A1c looks good today at 6.2.  Will try increasing the Ozempic to 1 mg if he tolerates that well that I am hopeful we can decrease and maybe even get rid of his [place].  I will update his prescription.  Can you Synjardy as well.  Lab Results  Component Value Date   HGBA1C 6.2 (A) 08/17/2023

## 2023-08-17 NOTE — Assessment & Plan Note (Signed)
 Gust cutting his statin in half for about 10 to 14 days to see if he notices a difference.  We could also consider doing a trial of a dense different statin just to see if that could be contributing.  He does have significant atrophy of his calves.

## 2023-08-17 NOTE — Assessment & Plan Note (Signed)
 Discussed that it is important to stay on a statin but I do want to decrease his dose just for a couple of weeks to see if it makes a difference with his short-term memory and with his muscle weakness.

## 2023-08-19 ENCOUNTER — Encounter: Payer: Self-pay | Admitting: *Deleted

## 2023-08-19 DIAGNOSIS — Z951 Presence of aortocoronary bypass graft: Secondary | ICD-10-CM

## 2023-08-19 NOTE — Progress Notes (Signed)
 Cardiac Individual Treatment Plan  Patient Details  Name: Jon [name] MRN: [id number] Date of Birth: 09/30/1945 Referring Provider:   Flowsheet Row Cardiac Rehab from 04/27/2023 in [facility] Cardiac and Pulmonary Rehab  Referring Provider Dr. [name]       Initial Encounter Date:  Flowsheet Row Cardiac Rehab from 04/27/2023 in [facility] Cardiac and Pulmonary Rehab  Date 04/27/23       Visit Diagnosis: S/P CABG x 3  Patient's Home Medications on Admission:  Current Outpatient Medications:    Accu-Chek Softclix Lancets lancets, Use to check blood sugars twice daily, Disp: 300 each, Rfl: 3   aspirin EC 81 MG tablet, Take 1 tablet (81 mg total) by mouth daily. Swallow whole., Disp: , Rfl:    atorvastatin (LIPITOR) 80 MG tablet, TAKE 1 TABLET BY MOUTH AT  BEDTIME, Disp: 100 tablet, Rfl: 2   Blood Glucose Monitoring Suppl (ACCU-CHEK GUIDE ME) w/Device KIT, Check glucose three times daily. Dx. Code E11.8, Disp: 1 kit, Rfl: 3   clopidogrel (PLAVIX) 75 MG tablet, Take 1 tablet (75 mg total) by mouth daily., Disp: 90 tablet, Rfl: 3   glucose blood (ACCU-CHEK GUIDE) test strip, Dx DM E11.9. Check fasting blood sugar every morning., Disp: 300 each, Rfl: PRN   insulin degludec (TRESIBA FLEXTOUCH) 100 UNIT/ML FlexTouch Pen, Inject 20 Units into the skin daily. (Patient taking differently: Inject 20 Units into the skin daily as needed (high blood sugar per patient).), Disp: 1 mL, Rfl: 0   metoprolol succinate (TOPROL-XL) 25 MG 24 hr tablet, Take 1 tablet (25 mg total) by mouth daily., Disp: 90 tablet, Rfl: 3   Semaglutide, 1 MG/DOSE, 4 MG/3ML SOPN, Inject 1 mg into the skin once a week., Disp: 9 mL, Rfl: 1   SYNJARDY 12.5-500 MG TABS, TAKE 1 TABLET BY MOUTH TWICE  DAILY, Disp: 180 tablet, Rfl: 3  Past Medical History: Past Medical History:  Diagnosis Date   BENIGN POSITIONAL VERTIGO 05/20/2010   Qualifier: Diagnosis of  By: Linford Arnold MD, Catherine     Centrilobular emphysema [facility])     Cerebrovascular disease 07/15/2018   Prior CVA & TIA. Carotid CTA ~50% Bilateral ICA,  Multifocal severe stenosis of the left V2 vertebral artery.  Right vertebral artery patent without significant stenosis.   Coronary artery disease involving native coronary artery with angina pectoris (HCC) 11/18/2017   Severe LM, LAD & RCA stenosis indicated on Coronary CTA => CaCardiac Cath 01/02/2022: dLM ~50%; mLAD 85%@D1  90% (1,1,1) - m-dLAD 90$ @ 65% D2 (0,1,1), RI 75%, pLCx 30%-OM1 60%, pRCA 25% - p-mRCA hazy 85%. Normal LVEF by Echo, EDP 16 mmHg. = Best option CABGrdiac Cath    Current every day smoker    No interest in quitting   Diabetes mellitus, type II, insulin dependent (HCC)    With peripheral neuropathy, peripheral vascular disease and coronary artery disease as complications.   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 => 12/17/2021: L Subclavia A PTA=Stent with resolution of Sx.   TIA (transient ischemic attack) 11/11/2021   Brief episode of left sided arm pain and slurred speech    Tobacco Use: Social History   Tobacco Use  Smoking Status Former   Current packs/day: 0.00   Average packs/day: 1 pack/day for 62.0 years (62.0 ttl  pk-yrs)   Types: Cigarettes   Start date: 01/13/1960   Quit date: 01/12/2022   Years since quitting: 1.6  Smokeless Tobacco Never    Labs: Review Flowsheet  More data exists      Latest Ref Rng & Units 12/04/2022 12/11/2022 01/30/2023 04/20/2023 08/17/2023  Labs for ITP Cardiac and Pulmonary Rehab  Cholestrol 0 - 200 mg/dL - 161  - - -  LDL (calc) 0 - 99 mg/dL - 51  - - -  HDL-C >09 mg/dL - 37  - - -  Trlycerides <150 mg/dL - 604  - - -  Hemoglobin A1c 4.0 - 5.6 % 7.8  - - 6.3  6.2   PH, Arterial 7.35 - 7.45 - 7.390  7.331  7.351  7.391   7.372  7.388  7.319  7.310  - -  PCO2 arterial 32 - 48 mmHg - 35.4  31.8  34.8  32.3  35.2  32.8  41.0  38.9  - -  Bicarbonate 20.0 - 28.0 mmol/L - 21.4  16.9  19.4  19.8  20.5  19.8  26.4  21.1  19.6  - -  TCO2 22 - 32 mmol/L - 22  18  20  21  22  25  21  24  23  28  22  21  21  23   - -  Acid-base deficit 0.0 - 2.0 mmol/L - 3.0  8.0  6.0  5.0  4.0  5.0  5.0  6.0  - -  O2 Saturation % - 95  99  99  99  100  100  83  100  100  - -    Details       Multiple values from one day are sorted in reverse-chronological order          Exercise Target Goals: Exercise Program Goal: Individual exercise prescription set using results from initial 6 min walk test and THRR while considering  patient's activity barriers and safety.   Exercise Prescription Goal: Initial exercise prescription builds to 30-45 minutes a day of aerobic activity, 2-3 days per week.  Home exercise guidelines will be given to patient during program as part of exercise prescription that the participant will acknowledge.   Education: Aerobic Exercise: - Group verbal and visual presentation on the components of exercise prescription. Introduces F.I.T.T principle from ACSM for exercise prescriptions.  Reviews F.I.T.T. principles of aerobic exercise including progression. Written material given at graduation.   Education: Resistance Exercise: - Group verbal and visual presentation on the components of exercise prescription. Introduces F.I.T.T principle from ACSM for exercise prescriptions  Reviews F.I.T.T. principles of resistance exercise including progression. Written material given at graduation.    Education: Exercise & Equipment Safety: - Individual verbal instruction and demonstration of equipment use and safety with use of the equipment. Flowsheet Row Cardiac Rehab from 06/10/2023 in [facility] Cardiac and Pulmonary Rehab  Date 04/27/23  Educator [facility]  Instruction Review Code 1- Verbalizes Understanding       Education:  Exercise Physiology & General Exercise Guidelines: - Group verbal and written instruction with models to review the exercise physiology of the cardiovascular system and associated critical values. Provides general exercise guidelines with specific guidelines to those with heart or lung disease.  Flowsheet Row Cardiac Rehab from 06/10/2023 in [facility] Cardiac and Pulmonary Rehab  Date 05/04/23  Educator [facility]  Instruction Review Code 1- [employer] Understanding       Education: Flexibility, Balance, Mind/Body Relaxation: - Group verbal  and visual presentation with interactive activity on the components of exercise prescription. Introduces F.I.T.T principle from ACSM for exercise prescriptions. Reviews F.I.T.T. principles of flexibility and balance exercise training including progression. Also discusses the mind body connection.  Reviews various relaxation techniques to help reduce and manage stress (i.e. Deep breathing, progressive muscle relaxation, and visualization). Balance handout provided to take home. Written material given at graduation.   Activity Barriers & Risk Stratification:  Activity Barriers & Cardiac Risk Stratification - 04/27/23 1543       Activity Barriers & Cardiac Risk Stratification   Activity Barriers Balance Concerns;Back Problems    Cardiac Risk Stratification High             6 Minute Walk:  6 Minute Walk     Row Name 04/27/23 1541         6 Minute Walk   Phase Initial     Distance 765 feet     Walk Time 6 minutes     # of Rest Breaks 0     MPH 1.45     METS 1.49     RPE 12     Perceived Dyspnea  1     VO2 Peak 5.2     Symptoms No     Resting HR 71 bpm     Resting BP 124/78     Resting Oxygen Saturation  98 %     Exercise Oxygen Saturation  during 6 min walk 99 %     Max Ex. HR 83 bpm     Max Ex. BP 120/82     2 Minute Post BP 122/82              Oxygen Initial Assessment:   Oxygen Re-Evaluation:   Oxygen Discharge (Final Oxygen  Re-Evaluation):   Initial Exercise Prescription:  Initial Exercise Prescription - 04/27/23 1500       Date of Initial Exercise RX and Referring Provider   Date 04/27/23    Referring Provider Dr. [name]      Oxygen   Maintain Oxygen Saturation 88% or higher      Treadmill   MPH 0.8    Grade 0    Minutes 15    METs 1.6      NuStep   Level 1    SPM 80    Minutes 15    METs 1.49      Arm Ergometer   Level 1    RPM 30    Minutes 15    METs 1.49      Track   Laps 10    Minutes 15    METs 1.54      Prescription Details   Frequency (times per week) 2    Duration Progress to 30 minutes of continuous aerobic without signs/symptoms of physical distress      Intensity   THRR 40-80% of Max Heartrate 100-129    Ratings of Perceived Exertion 11-13    Perceived Dyspnea 0-4      Progression   Progression Continue to progress workloads to maintain intensity without signs/symptoms of physical distress.      Resistance Training   Training Prescription Yes    Weight 4    Reps 10-15             Perform Capillary Blood Glucose checks as needed.  Exercise Prescription Changes:   Exercise Prescription Changes     Row Name 04/27/23 1500 05/21/23 1400 06/17/23 1000 06/17/23 1100 07/02/23  1400     Response to Exercise   Blood Pressure (Admit) 124/78 112/68 122/64 -- 114/60   Blood Pressure (Exercise) 120/82 108/58 134/68 -- 128/60   Blood Pressure (Exit) 122/82 112/56 110/60 -- 104/56   Heart Rate (Admit) 71 bpm 72 bpm 81 bpm -- 98 bpm   Heart Rate (Exercise) 83 bpm 98 bpm 106 bpm -- 109 bpm   Heart Rate (Exit) 71 bpm 70 bpm 79 bpm -- 77 bpm   Oxygen Saturation (Admit) 98 % -- -- -- --   Oxygen Saturation (Exercise) 99 % -- -- -- --   Oxygen Saturation (Exit) 99 % -- -- -- --   Rating of Perceived Exertion (Exercise) 12 11 13  -- 13   Perceived Dyspnea (Exercise) 1 0 0 -- 0   Symptoms none none none -- none   Comments 6 MWT resutls first two weeks of  exercise -- -- --   Duration -- Progress to 30 minutes of  aerobic without signs/symptoms of physical distress Progress to 30 minutes of  aerobic without signs/symptoms of physical distress Progress to 30 minutes of  aerobic without signs/symptoms of physical distress Progress to 30 minutes of  aerobic without signs/symptoms of physical distress   Intensity -- THRR unchanged THRR unchanged THRR unchanged THRR unchanged     Progression   Progression -- Continue to progress workloads to maintain intensity without signs/symptoms of physical distress. Continue to progress workloads to maintain intensity without signs/symptoms of physical distress. Continue to progress workloads to maintain intensity without signs/symptoms of physical distress. Continue to progress workloads to maintain intensity without signs/symptoms of physical distress.   Average METs -- 1.9 2.2 2.2 1.72     Resistance Training   Training Prescription -- Yes Yes Yes Yes   Weight -- 4 4 4 4    Reps -- 10-15 10-15 10-15 10-15     Interval Training   Interval Training -- No No No No     Treadmill   MPH -- -- 1.4 1.4 1.4   Grade -- -- 0 0 0   Minutes -- -- 15 15 15    METs -- -- 2.07 2.07 2.07     Recumbant Bike   Level -- -- 2 2 --   Watts -- -- 25 25 --   Minutes -- -- 15 15 --   METs -- -- 2.83 2.83 --     NuStep   Level -- 3 4 4  --   Minutes -- 15 15 15  --   METs -- 1.9 3.5 3.5 --     Arm Ergometer   Level -- -- -- -- 1   Minutes -- -- -- -- 15   METs -- -- -- -- 1.55     Biostep-RELP   Level -- -- -- -- 2   Minutes -- -- -- -- 15   METs -- -- -- -- 2     Track   Laps -- -- 10 10 11    Minutes -- -- 15 15 15    METs -- -- 1.54 1.54 1.6     Home Exercise Plan   Plans to continue exercise at -- -- -- [employer] (comment)  [facility] [employer] (comment)  [facility] [employer]   Frequency -- -- -- Add 2 additional days to program exercise sessions. Add 2 additional days to program exercise  sessions.   Initial Home Exercises Provided -- -- -- 06/17/23 06/17/23     Oxygen   Maintain Oxygen Saturation --  88% or higher 88% or higher 88% or higher 88% or higher    Row Name 07/16/23 0700             Response to Exercise   Blood Pressure (Admit) 118/62       Blood Pressure (Exercise) 120/68       Blood Pressure (Exit) 92/58       Heart Rate (Admit) 67 bpm       Heart Rate (Exercise) 97 bpm       Heart Rate (Exit) 76 bpm       Rating of Perceived Exertion (Exercise) 13       Symptoms none       Duration Continue with 30 min of aerobic exercise without signs/symptoms of physical distress.       Intensity THRR unchanged         Progression   Progression Continue to progress workloads to maintain intensity without signs/symptoms of physical distress.       Average METs 1.89         Resistance Training   Training Prescription Yes       Weight 4       Reps 10-15         Interval Training   Interval Training No         Treadmill   MPH 1.3       Grade 0       Minutes 15       METs 1.99         Recumbant Bike   Level 2       Watts 16       Minutes 15         NuStep   Level 3       Minutes 15       METs 2.2         Arm Ergometer   Level 1       Minutes 15       METs 1.54         T5 Nustep   Level 4  T6       SPM 80       Minutes 15       METs 2.3         Biostep-RELP   Level 1       Minutes 15       METs 2         Track   Laps 11       Minutes 15       METs 1.6         Home Exercise Plan   Plans to continue exercise at [employer] (comment)  [employer]       Frequency Add 2 additional days to program exercise sessions.       Initial Home Exercises Provided 06/17/23         Oxygen   Maintain Oxygen Saturation 88% or higher                Exercise Comments:   Exercise Comments     Row Name 05/04/23 1335           Exercise Comments First full day of exercise!  Patient was oriented to gym and equipment including  functions, settings, policies, and procedures.  Patient's individual exercise prescription and treatment plan were reviewed.  All starting workloads were established based on the results of the 6 minute walk test done at  initial orientation visit.  The plan for exercise progression was also introduced and progression will be customized based on patient's performance and goals.                Exercise Goals and Review:   Exercise Goals     Row Name 04/27/23 1550             Exercise Goals   Increase Physical Activity Yes       Intervention Provide advice, education, support and counseling about physical activity/exercise needs.;Develop an individualized exercise prescription for aerobic and resistive training based on initial evaluation findings, risk stratification, comorbidities and participant's personal goals.       Expected Outcomes Short Term: Attend rehab on a regular basis to increase amount of physical activity.;Long Term: Add in home exercise to make exercise part of routine and to increase amount of physical activity.;Long Term: Exercising regularly at least 3-5 days a week.       Increase Strength and Stamina Yes       Intervention Provide advice, education, support and counseling about physical activity/exercise needs.;Develop an individualized exercise prescription for aerobic and resistive training based on initial evaluation findings, risk stratification, comorbidities and participant's personal goals.       Expected Outcomes Short Term: Increase workloads from initial exercise prescription for resistance, speed, and METs.;Short Term: Perform resistance training exercises routinely during rehab and add in resistance training at home;Long Term: Improve cardiorespiratory fitness, muscular endurance and strength as measured by increased METs and functional capacity ( )       Able to understand and use rate of perceived exertion (RPE) scale Yes       Intervention Provide  education and explanation on how to use RPE scale       Expected Outcomes Short Term: Able to use RPE daily in rehab to express subjective intensity level;Long Term:  Able to use RPE to guide intensity level when exercising independently       Able to understand and use Dyspnea scale Yes       Intervention Provide education and explanation on how to use Dyspnea scale       Expected Outcomes Short Term: Able to use Dyspnea scale daily in rehab to express subjective sense of shortness of breath during exertion;Long Term: Able to use Dyspnea scale to guide intensity level when exercising independently       Knowledge and understanding of Target Heart Rate Range (THRR) Yes       Intervention Provide education and explanation of THRR including how the numbers were predicted and where they are located for reference       Expected Outcomes Short Term: Able to state/look up THRR;Long Term: Able to use THRR to govern intensity when exercising independently;Short Term: Able to use daily as guideline for intensity in rehab       Able to check pulse independently Yes       Intervention Provide education and demonstration on how to check pulse in carotid and radial arteries.;Review the importance of being able to check your own pulse for safety during independent exercise       Expected Outcomes Short Term: Able to explain why pulse checking is important during independent exercise;Long Term: Able to check pulse independently and accurately       Understanding of Exercise Prescription Yes       Intervention Provide education, explanation, and written materials on patient's individual exercise prescription       Expected Outcomes Short  Term: Able to explain program exercise prescription;Long Term: Able to explain home exercise prescription to exercise independently                Exercise Goals Re-Evaluation :  Exercise Goals Re-Evaluation     Row Name 05/04/23 1335 05/21/23 1429 06/17/23 1056 06/17/23  1134 06/29/23 1300     Exercise Goal Re-Evaluation   Exercise Goals Review Able to understand and use rate of perceived exertion (RPE) scale;Knowledge and understanding of Target Heart Rate Range (THRR);Able to understand and use Dyspnea scale;Understanding of Exercise Prescription Increase Physical Activity;Increase Strength and Stamina;Understanding of Exercise Prescription Increase Physical Activity;Increase Strength and Stamina;Understanding of Exercise Prescription Increase Strength and Stamina;Increase Physical Activity;Able to understand and use rate of perceived exertion (RPE) scale;Able to understand and use Dyspnea scale;Knowledge and understanding of Target Heart Rate Range (THRR);Able to check pulse independently;Understanding of Exercise Prescription Increase Physical Activity;Understanding of Exercise Prescription   Comments Reviewed RPE and dyspnea scale, THR and program prescription with pt today.  Pt voiced understanding and was given a copy of goals to take home. Jon [name] is off to a great start in the program. He was only able to attend one session during this review, but he was able to increase his level on the T4 nustep from 1 to 3. We will continue to monitor his progress in the program. Jon [name] is doing well in rehab. He was able to increase his speed on the treadmill to 1.83mph. He was also able to increase his level on the T4 nustep from level 3 to 4. We will continue to monitor his progress in the program. Reviewed home exercise with pt today.  Pt plans to go to [employer] for exercise.  Reviewed THR, pulse, RPE, sign and symptoms, pulse oximetery and when to call 911 or MD.  Also discussed weather considerations and indoor options.  Pt voiced understanding. Don's home exedrcise was reviewed with him recently. He has not been able to add an extra 1-2 days yet but is planning to soon once the weather is better. He does not like to get out when it is too cold or rainy/snowy.   Expected  Outcomes Short: Use RPE daily to regulate intensity. Long: Follow program prescription in THR. Short: Continue to follow current exercise prescription. Long: Continue exercise to improve strength and stamina. Short: Continue to follow current exercise prescription. Long: Continue exercise to improve strength and stamina. Short: add 1-2 days a week of exercise at community facility on of days of cardiac rehab. Long: maintain independent exercise routine upon graduation from cardiac rehab. Short: add 1-2 days a week of exercise at [employer]. Long: independently manage exercise routine.    Row Name 07/02/23 1411 07/16/23 0747 07/27/23 1603 08/10/23 1723       Exercise Goal Re-Evaluation   Exercise Goals Review Increase Physical Activity;Understanding of Exercise Prescription;Increase Strength and Stamina Increase Physical Activity;Understanding of Exercise Prescription;Increase Strength and Stamina Increase Physical Activity;Understanding of Exercise Prescription;Increase Strength and Stamina Increase Physical Activity;Understanding of Exercise Prescription;Increase Strength and Stamina    Comments Jon [name] is doing well in rehab. He was able to add the biostep at a level of 2 to his current exercise prescription. He was also able to maintain his workload on the treadmill at a speed of 1. and no incline. We will continue to monitor his progress in the program. Jon [name] is doing well in rehab. He was able to maintain level 2 on the recumbent bike, level 4 on the T6  nustep, 11 laps on the track, and level 1 on the arm ergometer. We will continue to monitor his progress in the program. Jon [name] has not attended since 07/08/2023. We called and left a voicemail to check in on him, and hope to hear back soon to figure out what is going on and when he plans to return. We will continue to monitor his progress in the program when he returns. Jon [name] has not attended since 07/08/2023. We were able to get in contact with his  "housekeeper" when we called on 3/28 and found out he is out of town and will be out of town for 10 more days. We will continue to monitor his progress in the program when he returns.    Expected Outcomes Short: Continue to increase treadmill workload. Long: Continue exercise to improve strength and stamina. Short: Try level 3 on the recumbent bike and to push for more laps on the track. Long: Continue exercise to improve strength and stamina. Short: Return to rehab when able to. Long: Continue exercise to improve strength and stamina Short: Return to rehab when able to. Long: Continue exercise to improve strength and stamina             Discharge Exercise Prescription (Final Exercise Prescription Changes):  Exercise Prescription Changes - 07/16/23 0700       Response to Exercise   Blood Pressure (Admit) 118/62    Blood Pressure (Exercise) 120/68    Blood Pressure (Exit) 92/58    Heart Rate (Admit) 67 bpm    Heart Rate (Exercise) 97 bpm    Heart Rate (Exit) 76 bpm    Rating of Perceived Exertion (Exercise) 13    Symptoms none    Duration Continue with 30 min of aerobic exercise without signs/symptoms of physical distress.    Intensity THRR unchanged      Progression   Progression Continue to progress workloads to maintain intensity without signs/symptoms of physical distress.    Average METs 1.89      Resistance Training   Training Prescription Yes    Weight 4    Reps 10-15      Interval Training   Interval Training No      Treadmill   MPH 1.3    Grade 0    Minutes 15    METs 1.99      Recumbant Bike   Level 2    Watts 16    Minutes 15      NuStep   Level 3    Minutes 15    METs 2.2      Arm Ergometer   Level 1    Minutes 15    METs 1.54      T5 Nustep   Level 4   T6   SPM 80    Minutes 15    METs 2.3      Biostep-RELP   Level 1    Minutes 15    METs 2      Track   Laps 11    Minutes 15    METs 1.6      Home Exercise Plan   Plans to continue  exercise at [employer] (comment)   [employer]   Frequency Add 2 additional days to program exercise sessions.    Initial Home Exercises Provided 06/17/23      Oxygen   Maintain Oxygen Saturation 88% or higher             Nutrition:  Target Goals: Understanding of nutrition guidelines, daily intake of sodium 1500mg , cholesterol 200mg , calories 30% from fat and 7% or less from saturated fats, daily to have 5 or more servings of fruits and vegetables.  Education: All About Nutrition: -Group instruction provided by verbal, written material, interactive activities, discussions, models, and posters to present general guidelines for heart healthy nutrition including fat, fiber, MyPlate, the role of sodium in heart healthy nutrition, utilization of the nutrition label, and utilization of this knowledge for meal planning. Follow up email sent as well. Written material given at graduation.   Biometrics:  Pre Biometrics - 04/27/23 1546       Pre Biometrics   Height 6\' 1"  (1.854 m)    Weight 209 lb 12.8 oz (95.2 kg)    Waist Circumference 43.5 inches    Hip Circumference 41 inches    Waist to Hip Ratio 1.06 %    BMI (Calculated) 27.69    Single Leg Stand 2.12 seconds              Nutrition Therapy Plan and Nutrition Goals:  Nutrition Therapy & Goals - 06/29/23 1304       Nutrition Therapy   RD appointment deferred Yes             Nutrition Assessments:  MEDIFICTS Score Key: >=70 Need to make dietary changes  40-70 Heart Healthy Diet <= 40 Therapeutic Level Cholesterol Diet  Flowsheet Row Cardiac Rehab from 05/04/2023 in [facility] Cardiac and Pul[facility] Rehab  Picture Your Plate Total Score on Admission 44      Picture Your Plate Scores: <16 Unhealthy dietary pattern with much room for improvement. 41-50 Dietary pattern unlikely to meet recommendations for good health and room for improvement. 51-60 More healthful dietary pattern, with some room for  improvement.  >60 Healthy dietary pattern, although there may be some specific behaviors that could be improved.    Nutrition Goals Re-Evaluation:   Nutrition Goals Discharge (Final Nutrition Goals Re-Evaluation):   Psychosocial: Target Goals: Acknowledge presence or absence of significant depression and/or stress, maximize coping skills, provide positive support system. Participant is able to verbalize types and ability to use techniques and skills needed for reducing stress and depression.   Education: Stress, Anxiety, and Depression - Group verbal and visual presentation to define topics covered.  Reviews how body is impacted by stress, anxiety, and depression.  Also discusses healthy ways to reduce stress and to treat/manage anxiety and depression.  Written material given at graduation.   Education: Sleep Hygiene -Provides group verbal and written instruction about how sleep can affect your health.  Define sleep hygiene, discuss sleep cycles and impact of sleep habits. Review good sleep hygiene tips.    Initial Review & Psychosocial Screening:  Initial Psych Review & Screening - 04/23/23 1502       Initial Review   Current issues with Current Stress Concerns    Source of Stress Concerns Unable to participate in former interests or hobbies;Unable to perform yard/household activities      Family Dynamics   Good Support System? Yes      Barriers   Psychosocial barriers to participate in program There are no identifiable barriers or psychosocial needs.;The patient should benefit from training in stress management and relaxation.      Screening Interventions   Interventions Encouraged to exercise;Provide feedback about the scores to participant;To provide support and resources with identified psychosocial needs    Expected Outcomes Short Term goal: Utilizing psychosocial counselor, staff and  physician to assist with identification of specific Stressors or current issues  interfering with healing process. Setting desired goal for each stressor or current issue identified.;Long Term Goal: Stressors or current issues are controlled or eliminated.;Short Term goal: Identification and review with participant of any Quality of Life or Depression concerns found by scoring the questionnaire.;Long Term goal: The participant improves quality of Life and PHQ9 Scores as seen by post scores and/or verbalization of changes             Quality of Life Scores:   Quality of Life - 05/04/23 1422       Quality of Life   Select Quality of Life      Quality of Life Scores   Health/Function Pre 23.09 %    Socioeconomic Pre 22.32 %    Psych/Spiritual Pre 23.5 %    Family Pre 21.07 %    GLOBAL Pre 28.5 %            Scores of 19 and below usually indicate a poorer quality of life in these areas.  A difference of  2-3 points is a clinically meaningful difference.  A difference of 2-3 points in the total score of the Quality of Life Index has been associated with significant improvement in overall quality of life, self-image, physical symptoms, and general health in studies assessing change in quality of life.  PHQ-9: Review Flowsheet  More data exists      07/15/2023 04/27/2023 12/04/2022 08/01/2022 01/20/2022  Depression screen PHQ 2/9  Decreased Interest 0 0 0 0 0  Down, Depressed, Hopeless 0 0 0 0 0  PHQ - 2 Score 0 0 0 0 0  Altered sleeping - 1 - - -  Tired, decreased energy - 1 - - -  Change in appetite - 0 - - -  Feeling bad or failure about yourself  - 0 - - -  Trouble concentrating - 0 - - -  Moving slowly or fidgety/restless - 0 - - -  Suicidal thoughts - 0 - - -  PHQ-9 Score - 2 - - -  Difficult doing work/chores - Not difficult at all - - -   Interpretation of Total Score  Total Score Depression Severity:  1-4 = Minimal depression, 5-9 = Mild depression, 10-14 = Moderate depression, 15-19 = Moderately severe depression, 20-27 = Severe depression    Psychosocial Evaluation and Intervention:  Psychosocial Evaluation - 04/23/23 1506       Psychosocial Evaluation & Interventions   Interventions Encouraged to exercise with the program and follow exercise prescription    Comments Jon [name] is 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 He has had a long recovery related to balance and walking. He has spent 22 days in a rehab facility and completed [facility] rehab. He is hesitant about the program because he is unsure what more rehab will do for him. We discussed how this is a more independent program that will help him strengthen his balance and regain muscle. He wants to work on his independence so he can maintain it.    Expected Outcomes Short: attend cardiac rehab for education and exercise. Long: develop and maintain positive self care habits    Continue Psychosocial Services  Follow up required by staff             Psychosocial Re-Evaluation:  Psychosocial Re-Evaluation     Row Name 06/29/23 1304             Psychosocial  Re-Evaluation   Current issues with Current Stress Concerns       Comments Jon [name] states he is still attending the program because he is hopeful it will help him feel better. Some of his initial concerns about the program have been resolved as he is more inclined to attend the program since it is helping his independence. He still has health concerns that have not resolved but is planning on talking to his pcp in March. Discussed importance of education in program to help with maintaining a positive life balance       Expected Outcomes Short: attend program consistently for  maximum benefit  Long: manage mental health habits       Continue Psychosocial Services  Follow up required by staff                Psychosocial Discharge (Final Psychosocial Re-Evaluation):  Psychosocial Re-Evaluation - 06/29/23 1304       Psychosocial Re-Evaluation   Current issues with Current Stress Concerns    Comments Jon [name] states he  is still attending the program because he is hopeful it will help him feel better. Some of his initial concerns about the program have been resolved as he is more inclined to attend the program since it is helping his independence. He still has health concerns that have not resolved but is planning on talking to his pcp in March. Discussed importance of education in program to help with maintaining a positive life balance    Expected Outcomes Short: attend program consistently for  maximum benefit  Long: manage mental health habits    Continue Psychosocial Services  Follow up required by staff             Vocational Rehabilitation: Provide vocational rehab assistance to qualifying candidates.   Vocational Rehab Evaluation & Intervention:  Vocational Rehab - 04/23/23 1502       Initial Vocational Rehab Evaluation & Intervention   Assessment shows need for Vocational Rehabilitation No             Education: Education Goals: Education classes will be provided on a variety of topics geared toward better understanding of heart health and risk factor modification. Participant will state understanding/return demonstration of topics presented as noted by education test scores.  Learning Barriers/Preferences:  Learning Barriers/Preferences - 04/23/23 1502       Learning Barriers/Preferences   Learning Barriers None    Learning Preferences Individual Instruction             General Cardiac Education Topics:  AED/CPR: - Group verbal and written instruction with the use of models to demonstrate the basic use of the AED with the basic ABC's of resuscitation.   Anatomy and Cardiac Procedures: - Group verbal and visual presentation and models provide information about basic cardiac anatomy and function. Reviews the testing methods done to diagnose heart disease and the outcomes of the test results. Describes the treatment choices: Medical Management, Angioplasty, or Coronary Bypass  Surgery for treating various heart conditions including Myocardial Infarction, Angina, Valve Disease, and Cardiac Arrhythmias.  Written material given at graduation. Flowsheet Row Cardiac Rehab from 06/10/2023 in [facility] Cardiac and Pulmonary Rehab  Date 06/10/23  Educator sb  Instruction Review Code 1- Verbalizes Understanding       Medication Safety: - Group verbal and visual instruction to review commonly prescribed medications for heart and lung disease. Reviews the medication, class of the drug, and side effects. Includes the steps to properly store meds and maintain  the prescription regimen.  Written material given at graduation.   Intimacy: - Group verbal instruction through game format to discuss how heart and lung disease can affect sexual intimacy. Written material given at graduation..   Know Your Numbers and Heart Failure: - Group verbal and visual instruction to discuss disease risk factors for cardiac and pulmonary disease and treatment options.  Reviews associated critical values for Overweight/Obesity, Hypertension, Cholesterol, and Diabetes.  Discusses basics of heart failure: signs/symptoms and treatments.  Introduces Heart Failure Zone chart for action plan for heart failure.  Written material given at graduation.   Infection Prevention: - Provides verbal and written material to individual with discussion of infection control including proper hand washing and proper equipment cleaning during exercise session. Flowsheet Row Cardiac Rehab from 06/10/2023 in [facility] Cardiac and Pulmonary Rehab  Date 04/27/23  Educator [facility]  Instruction Review Code 1- Verbalizes Understanding       Falls Prevention: - Provides verbal and written material to individual with discussion of falls prevention and safety. Flowsheet Row Cardiac Rehab from 06/10/2023 in [facility] Cardiac and Pulmonary Rehab  Date 04/27/23  Educator [facility]  Instruction Review Code 1- Verbalizes Understanding        Other: -Provides group and verbal instruction on various topics (see comments)   Knowledge Questionnaire Score:  Knowledge Questionnaire Score - 05/04/23 1424       Knowledge Questionnaire Score   Pre Score 22/26nutrition, exercie   HF             Core Components/Risk Factors/Patient Goals at Admission:  Personal Goals and Risk Factors at Admission - 04/27/23 1548       Core Components/Risk Factors/Patient Goals on Admission    Weight Management Yes;Weight Maintenance    Intervention Weight Management: Develop a combined nutrition and exercise program designed to reach desired caloric intake, while maintaining appropriate intake of nutrient and fiber, sodium and fats, and appropriate energy expenditure required for the weight goal.;Weight Management: Provide education and appropriate resources to help participant work on and attain dietary goals.;Weight Management/Obesity: Establish reasonable short term and long term weight goals.    Admit Weight 209 lb 12.8 oz (95.2 kg)    Goal Weight: Short Term 210 lb (95.3 kg)    Goal Weight: Long Term 210 lb (95.3 kg)    Expected Outcomes Short Term: Continue to assess and modify interventions until short term weight is achieved;Long Term: Adherence to nutrition and physical activity/exercise program aimed toward attainment of established weight goal;Weight Maintenance: Understanding of the daily nutrition guidelines, which includes 25-35% calories from fat, 7% or less cal from saturated fats, less than 200mg  cholesterol, less than 1.5gm of sodium, & 5 or more servings of fruits and vegetables daily;Understanding recommendations for meals to include 15-35% energy as protein, 25-35% energy from fat, 35-60% energy from carbohydrates, less than 200mg  of dietary cholesterol, 20-35 gm of total fiber daily;Understanding of distribution of calorie intake throughout the day with the consumption of 4-5 meals/snacks    Diabetes Yes    Intervention  Provide education about proper nutrition, including hydration, and aerobic/resistive exercise prescription along with prescribed medications to achieve blood glucose in normal ranges: Fasting glucose 65-99 mg/dL;Provide education about signs/symptoms and action to take for hypo/hyperglycemia.    Expected Outcomes Short Term: Participant verbalizes understanding of the signs/symptoms and immediate care of hyper/hypoglycemia, proper foot care and importance of medication, aerobic/resistive exercise and nutrition plan for blood glucose control.;Long Term: Attainment of HbA1C < 7%.    Hypertension Yes  Intervention Provide education on lifestyle modifcations including regular physical activity/exercise, weight management, moderate sodium restriction and increased consumption of fresh fruit, vegetables, and low fat dairy, alcohol moderation, and smoking cessation.;Monitor prescription use compliance.    Expected Outcomes Short Term: Continued assessment and intervention until BP is < 140/74mm HG in hypertensive participants. < 130/55mm HG in hypertensive participants with diabetes, heart failure or chronic kidney disease.;Long Term: Maintenance of blood pressure at goal levels.             Education:Diabetes - Individual verbal and written instruction to review signs/symptoms of diabetes, desired ranges of glucose level fasting, after meals and with exercise. Acknowledge that pre and post exercise glucose checks will be done for 3 sessions at entry of program. Flowsheet Row Cardiac Rehab from 06/10/2023 in [facility] Cardiac and Pulmonary Rehab  Date 04/27/23  Educator [facility]  Instruction Review Code 1- Verbalizes Understanding       Core Components/Risk Factors/Patient Goals Review:   Goals and Risk Factor Review     Row Name 06/29/23 1310             Core Components/Risk Factors/Patient Goals Review   Personal Goals Review Hypertension;Diabetes       Review Jon [name] states his blood pressure has been  stable since his CABG. He is no longer on blood presure medication. His CBG has been consistent and he has been sticking to a consistent diet as well as adhering to his medications. His biggest concern is when he walks sometimes he feels "wobbly in the head" He has been encouraged to check his blood pressure during these spells as well as his blood sugar to help his doctor rule out certain things. He says he will bring a log to his doctor appointment in March       Expected Outcomes Short: keep log of blood pressures and blood sugars during these spells. Long: independenlty manage risk factors.                Core Components/Risk Factors/Patient Goals at Discharge (Final Review):   Goals and Risk Factor Review - 06/29/23 1310       Core Components/Risk Factors/Patient Goals Review   Personal Goals Review Hypertension;Diabetes    Review Jon [name] states his blood pressure has been stable since his CABG. He is no longer on blood presure medication. His CBG has been consistent and he has been sticking to a consistent diet as well as adhering to his medications. His biggest concern is when he walks sometimes he feels "wobbly in the head" He has been encouraged to check his blood pressure during these spells as well as his blood sugar to help his doctor rule out certain things. He says he will bring a log to his doctor appointment in March    Expected Outcomes Short: keep log of blood pressures and blood sugars during these spells. Long: independenlty manage risk factors.             ITP Comments:  ITP Comments     Row Name 04/23/23 1500 04/27/23 1541 04/29/23 0944 05/04/23 1335 05/27/23 1251   ITP Comments Initial phone call completed. Diagnosis can be [facility] in [facility] 12/[facility]. EP Orientation scheduled for Monday 12/16 at 1:30. Completed and gym orientation. Initial ITP created and sent for review to Dr. Bethann Punches, Medical Director. 30 Day review completed. Medical Director ITP review done, changes  made as directed, and signed approval by Medical Director.    new to program First full  day of exercise!  Patient was oriented to gym and equipment including functions, settings, policies, and procedures.  Patient's individual exercise prescription and treatment plan were reviewed.  All starting workloads were established based on the results of the 6 minute walk test done at initial orientation visit.  The plan for exercise progression was also introduced and progression will be customized based on patient's performance and goals. 30 Day review completed. Medical Director ITP review done, changes made as directed, and signed approval by Medical Director.    new to program    Row Name 06/24/23 0750 07/22/23 0856 08/19/23 0740       ITP Comments 30 Day review completed. Medical Director ITP review done, changes made as directed, and signed approval by Medical Director. 30 Day review completed. Medical Director ITP review done, changes made as directed, and signed approval by Medical Director. 30 Day review completed. Medical Director ITP review done, changes made as directed, and signed approval by Medical Director.     absent  since 2/26 has been called   no response yet              Comments:

## 2023-08-21 ENCOUNTER — Ambulatory Visit: Payer: Medicare Other

## 2023-08-27 ENCOUNTER — Encounter: Payer: Self-pay | Admitting: *Deleted

## 2023-08-27 DIAGNOSIS — Z951 Presence of aortocoronary bypass graft: Secondary | ICD-10-CM

## 2023-08-27 NOTE — Progress Notes (Signed)
 Cardiac Individual Treatment Plan  Patient Details  Name: Navon Kotowski MRN: [id number] Date of Birth: 12/04/1945 Referring Provider:   Flowsheet Row Cardiac Rehab from 04/27/2023 in [facility] Cardiac and Pulmonary Rehab  Referring Provider Dr. [name]       Initial Encounter Date:  Flowsheet Row Cardiac Rehab from 04/27/2023 in [facility] Cardiac and Pulmonary Rehab  Date 04/27/23       Visit Diagnosis: S/P CABG x 3  Patient's Home Medications on Admission:  Current Outpatient Medications:    Accu-Chek Softclix Lancets lancets, Use to check blood sugars twice daily, Disp: 300 each, Rfl: 3   aspirin EC 81 MG tablet, Take 1 tablet (81 mg total) by mouth daily. Swallow whole., Disp: , Rfl:    atorvastatin (LIPITOR) 80 MG tablet, TAKE 1 TABLET BY MOUTH AT  BEDTIME, Disp: 100 tablet, Rfl: 2   Blood Glucose Monitoring Suppl (ACCU-CHEK GUIDE ME) w/Device KIT, Check glucose three times daily. Dx. Code E11.8, Disp: 1 kit, Rfl: 3   clopidogrel (PLAVIX) 75 MG tablet, Take 1 tablet (75 mg total) by mouth daily., Disp: 90 tablet, Rfl: 3   glucose blood (ACCU-CHEK GUIDE) test strip, Dx DM E11.9. Check fasting blood sugar every morning., Disp: 300 each, Rfl: PRN   insulin degludec (TRESIBA FLEXTOUCH) 100 UNIT/ML FlexTouch Pen, Inject 20 Units into the skin daily. (Patient taking differently: Inject 20 Units into the skin daily as needed (high blood sugar per patient).), Disp: 1 mL, Rfl: 0   metoprolol succinate (TOPROL-XL) 25 MG 24 hr tablet, Take 1 tablet (25 mg total) by mouth daily., Disp: 90 tablet, Rfl: 3   Semaglutide, 1 MG/DOSE, 4 MG/3ML SOPN, Inject 1 mg into the skin once a week., Disp: 9 mL, Rfl: 1   SYNJARDY 12.5-500 MG TABS, TAKE 1 TABLET BY MOUTH TWICE  DAILY, Disp: 180 tablet, Rfl: 3  Past Medical History: Past Medical History:  Diagnosis Date   BENIGN POSITIONAL VERTIGO 05/20/2010   Qualifier: Diagnosis of  By: Linford Arnold MD, Catherine     Centrilobular emphysema [facility])     Cerebrovascular disease 07/15/2018   Prior CVA & TIA. Carotid CTA ~50% Bilateral ICA,  Multifocal severe stenosis of the left V2 vertebral artery.  Right vertebral artery patent without significant stenosis.   Coronary artery disease involving native coronary artery with angina pectoris (HCC) 11/18/2017   Severe LM, LAD & RCA stenosis indicated on Coronary CTA => CaCardiac Cath 01/02/2022: dLM ~50%; mLAD 85%@D1  90% (1,1,1) - m-dLAD 90$ @ 65% D2 (0,1,1), RI 75%, pLCx 30%-OM1 60%, pRCA 25% - p-mRCA hazy 85%. Normal LVEF by Echo, EDP 16 mmHg. = Best option CABGrdiac Cath    Current every day smoker    No interest in quitting   Diabetes mellitus, type II, insulin dependent (HCC)    With peripheral neuropathy, peripheral vascular disease and coronary artery disease as complications.   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 => 12/17/2021: L Subclavia A PTA=Stent with resolution of Sx.   TIA (transient ischemic attack) 11/11/2021   Brief episode of left sided arm pain and slurred speech    Tobacco Use: Social History   Tobacco Use  Smoking Status Former   Current packs/day: 0.00   Average packs/day: 1 pack/day for 62.0 years (62.0 ttl  pk-yrs)   Types: Cigarettes   Start date: 01/13/1960   Quit date: 01/12/2022   Years since quitting: 1.6  Smokeless Tobacco Never    Labs: Review Flowsheet  More data exists      Latest Ref Rng & Units 12/04/2022 12/11/2022 01/30/2023 04/20/2023 08/17/2023  Labs for ITP Cardiac and Pulmonary Rehab  Cholestrol 0 - 200 mg/dL - 161  - - -  LDL (calc) 0 - 99 mg/dL - 51  - - -  HDL-C >09 mg/dL - 37  - - -  Trlycerides <150 mg/dL - 604  - - -  Hemoglobin A1c 4.0 - 5.6 % 7.8  - - 6.3  6.2   PH, Arterial 7.35 - 7.45 - 7.390  7.331  7.351  7.391   7.372  7.388  7.319  7.310  - -  PCO2 arterial 32 - 48 mmHg - 35.4  31.8  34.8  32.3  35.2  32.8  41.0  38.9  - -  Bicarbonate 20.0 - 28.0 mmol/L - 21.4  16.9  19.4  19.8  20.5  19.8  26.4  21.1  19.6  - -  TCO2 22 - 32 mmol/L - 22  18  20  21  22  25  21  24  23  28  22  21  21  23   - -  Acid-base deficit 0.0 - 2.0 mmol/L - 3.0  8.0  6.0  5.0  4.0  5.0  5.0  6.0  - -  O2 Saturation % - 95  99  99  99  100  100  83  100  100  - -    Details       Multiple values from one day are sorted in reverse-chronological order          Exercise Target Goals: Exercise Program Goal: Individual exercise prescription set using results from initial 6 min walk test and THRR while considering  patient's activity barriers and safety.   Exercise Prescription Goal: Initial exercise prescription builds to 30-45 minutes a day of aerobic activity, 2-3 days per week.  Home exercise guidelines will be given to patient during program as part of exercise prescription that the participant will acknowledge.   Education: Aerobic Exercise: - Group verbal and visual presentation on the components of exercise prescription. Introduces F.I.T.T principle from ACSM for exercise prescriptions.  Reviews F.I.T.T. principles of aerobic exercise including progression. Written material given at graduation.   Education: Resistance Exercise: - Group verbal and visual presentation on the components of exercise prescription. Introduces F.I.T.T principle from ACSM for exercise prescriptions  Reviews F.I.T.T. principles of resistance exercise including progression. Written material given at graduation.    Education: Exercise & Equipment Safety: - Individual verbal instruction and demonstration of equipment use and safety with use of the equipment. Flowsheet Row Cardiac Rehab from 06/10/2023 in [facility] Cardiac and Pulmonary Rehab  Date 04/27/23  Educator [facility]  Instruction Review Code 1- Verbalizes Understanding       Education:  Exercise Physiology & General Exercise Guidelines: - Group verbal and written instruction with models to review the exercise physiology of the cardiovascular system and associated critical values. Provides general exercise guidelines with specific guidelines to those with heart or lung disease.  Flowsheet Row Cardiac Rehab from 06/10/2023 in [facility] Cardiac and Pulmonary Rehab  Date 05/04/23  Educator [facility]  Instruction Review Code 1- [employer] Understanding       Education: Flexibility, Balance, Mind/Body Relaxation: - Group verbal  and visual presentation with interactive activity on the components of exercise prescription. Introduces F.I.T.T principle from ACSM for exercise prescriptions. Reviews F.I.T.T. principles of flexibility and balance exercise training including progression. Also discusses the mind body connection.  Reviews various relaxation techniques to help reduce and manage stress (i.e. Deep breathing, progressive muscle relaxation, and visualization). Balance handout provided to take home. Written material given at graduation.   Activity Barriers & Risk Stratification:  Activity Barriers & Cardiac Risk Stratification - 04/27/23 1543       Activity Barriers & Cardiac Risk Stratification   Activity Barriers Balance Concerns;Back Problems    Cardiac Risk Stratification High             6 Minute Walk:  6 Minute Walk     Row Name 04/27/23 1541         6 Minute Walk   Phase Initial     Distance 765 feet     Walk Time 6 minutes     # of Rest Breaks 0     MPH 1.45     METS 1.49     RPE 12     Perceived Dyspnea  1     VO2 Peak 5.2     Symptoms No     Resting HR 71 bpm     Resting BP 124/78     Resting Oxygen Saturation  98 %     Exercise Oxygen Saturation  during 6 min walk 99 %     Max Ex. HR 83 bpm     Max Ex. BP 120/82     2 Minute Post BP 122/82              Oxygen Initial Assessment:   Oxygen Re-Evaluation:   Oxygen Discharge (Final Oxygen  Re-Evaluation):   Initial Exercise Prescription:  Initial Exercise Prescription - 04/27/23 1500       Date of Initial Exercise RX and Referring Provider   Date 04/27/23    Referring Provider Dr. Randene Bustard      Oxygen   Maintain Oxygen Saturation 88% or higher      Treadmill   MPH 0.8    Grade 0    Minutes 15    METs 1.6      NuStep   Level 1    SPM 80    Minutes 15    METs 1.49      Arm Ergometer   Level 1    RPM 30    Minutes 15    METs 1.49      Track   Laps 10    Minutes 15    METs 1.54      Prescription Details   Frequency (times per week) 2    Duration Progress to 30 minutes of continuous aerobic without signs/symptoms of physical distress      Intensity   THRR 40-80% of Max Heartrate 100-129    Ratings of Perceived Exertion 11-13    Perceived Dyspnea 0-4      Progression   Progression Continue to progress workloads to maintain intensity without signs/symptoms of physical distress.      Resistance Training   Training Prescription Yes    Weight 4    Reps 10-15             Perform Capillary Blood Glucose checks as needed.  Exercise Prescription Changes:   Exercise Prescription Changes     Row Name 04/27/23 1500 05/21/23 1400 06/17/23 1000 06/17/23 1100 07/02/23  1400     Response to Exercise   Blood Pressure (Admit) 124/78 112/68 122/64 -- 114/60   Blood Pressure (Exercise) 120/82 108/58 134/68 -- 128/60   Blood Pressure (Exit) 122/82 112/56 110/60 -- 104/56   Heart Rate (Admit) 71 bpm 72 bpm 81 bpm -- 98 bpm   Heart Rate (Exercise) 83 bpm 98 bpm 106 bpm -- 109 bpm   Heart Rate (Exit) 71 bpm 70 bpm 79 bpm -- 77 bpm   Oxygen Saturation (Admit) 98 % -- -- -- --   Oxygen Saturation (Exercise) 99 % -- -- -- --   Oxygen Saturation (Exit) 99 % -- -- -- --   Rating of Perceived Exertion (Exercise) 12 11 13  -- 13   Perceived Dyspnea (Exercise) 1 0 0 -- 0   Symptoms none none none -- none   Comments 6 MWT resutls first two weeks of  exercise -- -- --   Duration -- Progress to 30 minutes of  aerobic without signs/symptoms of physical distress Progress to 30 minutes of  aerobic without signs/symptoms of physical distress Progress to 30 minutes of  aerobic without signs/symptoms of physical distress Progress to 30 minutes of  aerobic without signs/symptoms of physical distress   Intensity -- THRR unchanged THRR unchanged THRR unchanged THRR unchanged     Progression   Progression -- Continue to progress workloads to maintain intensity without signs/symptoms of physical distress. Continue to progress workloads to maintain intensity without signs/symptoms of physical distress. Continue to progress workloads to maintain intensity without signs/symptoms of physical distress. Continue to progress workloads to maintain intensity without signs/symptoms of physical distress.   Average METs -- 1.9 2.2 2.2 1.72     Resistance Training   Training Prescription -- Yes Yes Yes Yes   Weight -- 4 4 4 4    Reps -- 10-15 10-15 10-15 10-15     Interval Training   Interval Training -- No No No No     Treadmill   MPH -- -- 1.4 1.4 1.4   Grade -- -- 0 0 0   Minutes -- -- 15 15 15    METs -- -- 2.07 2.07 2.07     Recumbant Bike   Level -- -- 2 2 --   Watts -- -- 25 25 --   Minutes -- -- 15 15 --   METs -- -- 2.83 2.83 --     NuStep   Level -- 3 4 4  --   Minutes -- 15 15 15  --   METs -- 1.9 3.5 3.5 --     Arm Ergometer   Level -- -- -- -- 1   Minutes -- -- -- -- 15   METs -- -- -- -- 1.55     Biostep-RELP   Level -- -- -- -- 2   Minutes -- -- -- -- 15   METs -- -- -- -- 2     Track   Laps -- -- 10 10 11    Minutes -- -- 15 15 15    METs -- -- 1.54 1.54 1.6     Home Exercise Plan   Plans to continue exercise at -- -- -- [employer] (comment)  [facility] [employer] (comment)  [facility]   Frequency -- -- -- Add 2 additional days to program exercise sessions. Add 2 additional days to program exercise  sessions.   Initial Home Exercises Provided -- -- -- 06/17/23 06/17/23     Oxygen   Maintain Oxygen Saturation --  88% or higher 88% or higher 88% or higher 88% or higher    Row Name 07/16/23 0700             Response to Exercise   Blood Pressure (Admit) 118/62       Blood Pressure (Exercise) 120/68       Blood Pressure (Exit) 92/58       Heart Rate (Admit) 67 bpm       Heart Rate (Exercise) 97 bpm       Heart Rate (Exit) 76 bpm       Rating of Perceived Exertion (Exercise) 13       Symptoms none       Duration Continue with 30 min of aerobic exercise without signs/symptoms of physical distress.       Intensity THRR unchanged         Progression   Progression Continue to progress workloads to maintain intensity without signs/symptoms of physical distress.       Average METs 1.89         Resistance Training   Training Prescription Yes       Weight 4       Reps 10-15         Interval Training   Interval Training No         Treadmill   MPH 1.3       Grade 0       Minutes 15       METs 1.99         Recumbant Bike   Level 2       Watts 16       Minutes 15         NuStep   Level 3       Minutes 15       METs 2.2         Arm Ergometer   Level 1       Minutes 15       METs 1.54         T5 Nustep   Level 4  T6       SPM 80       Minutes 15       METs 2.3         Biostep-RELP   Level 1       Minutes 15       METs 2         Track   Laps 11       Minutes 15       METs 1.6         Home Exercise Plan   Plans to continue exercise at [employer] (comment)  [employer]       Frequency Add 2 additional days to program exercise sessions.       Initial Home Exercises Provided 06/17/23         Oxygen   Maintain Oxygen Saturation 88% or higher                Exercise Comments:   Exercise Comments     Row Name 05/04/23 1335           Exercise Comments First full day of exercise!  Patient was oriented to gym and equipment including  functions, settings, policies, and procedures.  Patient's individual exercise prescription and treatment plan were reviewed.  All starting workloads were established based on the results of the 6 minute walk test done at  initial orientation visit.  The plan for exercise progression was also introduced and progression will be customized based on patient's performance and goals.                Exercise Goals and Review:   Exercise Goals     Row Name 04/27/23 1550             Exercise Goals   Increase Physical Activity Yes       Intervention Provide advice, education, support and counseling about physical activity/exercise needs.;Develop an individualized exercise prescription for aerobic and resistive training based on initial evaluation findings, risk stratification, comorbidities and participant's personal goals.       Expected Outcomes Short Term: Attend rehab on a regular basis to increase amount of physical activity.;Long Term: Add in home exercise to make exercise part of routine and to increase amount of physical activity.;Long Term: Exercising regularly at least 3-5 days a week.       Increase Strength and Stamina Yes       Intervention Provide advice, education, support and counseling about physical activity/exercise needs.;Develop an individualized exercise prescription for aerobic and resistive training based on initial evaluation findings, risk stratification, comorbidities and participant's personal goals.       Expected Outcomes Short Term: Increase workloads from initial exercise prescription for resistance, speed, and METs.;Short Term: Perform resistance training exercises routinely during rehab and add in resistance training at home;Long Term: Improve cardiorespiratory fitness, muscular endurance and strength as measured by increased METs and functional capacity ( )       Able to understand and use rate of perceived exertion (RPE) scale Yes       Intervention Provide  education and explanation on how to use RPE scale       Expected Outcomes Short Term: Able to use RPE daily in rehab to express subjective intensity level;Long Term:  Able to use RPE to guide intensity level when exercising independently       Able to understand and use Dyspnea scale Yes       Intervention Provide education and explanation on how to use Dyspnea scale       Expected Outcomes Short Term: Able to use Dyspnea scale daily in rehab to express subjective sense of shortness of breath during exertion;Long Term: Able to use Dyspnea scale to guide intensity level when exercising independently       Knowledge and understanding of Target Heart Rate Range (THRR) Yes       Intervention Provide education and explanation of THRR including how the numbers were predicted and where they are located for reference       Expected Outcomes Short Term: Able to state/look up THRR;Long Term: Able to use THRR to govern intensity when exercising independently;Short Term: Able to use daily as guideline for intensity in rehab       Able to check pulse independently Yes       Intervention Provide education and demonstration on how to check pulse in carotid and radial arteries.;Review the importance of being able to check your own pulse for safety during independent exercise       Expected Outcomes Short Term: Able to explain why pulse checking is important during independent exercise;Long Term: Able to check pulse independently and accurately       Understanding of Exercise Prescription Yes       Intervention Provide education, explanation, and written materials on patient's individual exercise prescription       Expected Outcomes Short  Term: Able to explain program exercise prescription;Long Term: Able to explain home exercise prescription to exercise independently                Exercise Goals Re-Evaluation :  Exercise Goals Re-Evaluation     Row Name 05/04/23 1335 05/21/23 1429 06/17/23 1056 06/17/23  1134 06/29/23 1300     Exercise Goal Re-Evaluation   Exercise Goals Review Able to understand and use rate of perceived exertion (RPE) scale;Knowledge and understanding of Target Heart Rate Range (THRR);Able to understand and use Dyspnea scale;Understanding of Exercise Prescription Increase Physical Activity;Increase Strength and Stamina;Understanding of Exercise Prescription Increase Physical Activity;Increase Strength and Stamina;Understanding of Exercise Prescription Increase Strength and Stamina;Increase Physical Activity;Able to understand and use rate of perceived exertion (RPE) scale;Able to understand and use Dyspnea scale;Knowledge and understanding of Target Heart Rate Range (THRR);Able to check pulse independently;Understanding of Exercise Prescription Increase Physical Activity;Understanding of Exercise Prescription   Comments Reviewed RPE and dyspnea scale, THR and program prescription with pt today.  Pt voiced understanding and was given a copy of goals to take home. Dorinda Hill is off to a great start in the program. He was only able to attend one session during this review, but he was able to increase his level on the T4 nustep from 1 to 3. We will continue to monitor his progress in the program. Dorinda Hill is doing well in rehab. He was able to increase his speed on the treadmill to 1.8mph. He was also able to increase his level on the T4 nustep from level 3 to 4. We will continue to monitor his progress in the program. Reviewed home exercise with pt today.  Pt plans to go to [employer] for exercise.  Reviewed THR, pulse, RPE, sign and symptoms, pulse oximetery and when to call 911 or MD.  Also discussed weather considerations and indoor options.  Pt voiced understanding. Don's home exedrcise was reviewed with him recently. He has not been able to add an extra 1-2 days yet but is planning to soon once the weather is better. He does not like to get out when it is too cold or rainy/snowy.   Expected  Outcomes Short: Use RPE daily to regulate intensity. Long: Follow program prescription in THR. Short: Continue to follow current exercise prescription. Long: Continue exercise to improve strength and stamina. Short: Continue to follow current exercise prescription. Long: Continue exercise to improve strength and stamina. Short: add 1-2 days a week of exercise at community facility on of days of cardiac rehab. Long: maintain independent exercise routine upon graduation from cardiac rehab. Short: add 1-2 days a week of exercise at [employer]. Long: independently manage exercise routine.    Row Name 07/02/23 1411 07/16/23 0747 07/27/23 1603 08/10/23 1723       Exercise Goal Re-Evaluation   Exercise Goals Review Increase Physical Activity;Understanding of Exercise Prescription;Increase Strength and Stamina Increase Physical Activity;Understanding of Exercise Prescription;Increase Strength and Stamina Increase Physical Activity;Understanding of Exercise Prescription;Increase Strength and Stamina Increase Physical Activity;Understanding of Exercise Prescription;Increase Strength and Stamina    Comments Roe Coombs is doing well in rehab. He was able to add the biostep at a level of 2 to his current exercise prescription. He was also able to maintain his workload on the treadmill at a speed of 1. and no incline. We will continue to monitor his progress in the program. Dorinda Hill is doing well in rehab. He was able to maintain level 2 on the recumbent bike, level 4 on the T6  nustep, 11 laps on the track, and level 1 on the arm ergometer. We will continue to monitor his progress in the program. Dorinda Hill has not attended since 07/08/2023. We called and left a voicemail to check in on him, and hope to hear back soon to figure out what is going on and when he plans to return. We will continue to monitor his progress in the program when he returns. Dorinda Hill has not attended since 07/08/2023. We were able to get in contact with his  "housekeeper" when we called on 3/28 and found out he is out of town and will be out of town for 10 more days. We will continue to monitor his progress in the program when he returns.    Expected Outcomes Short: Continue to increase treadmill workload. Long: Continue exercise to improve strength and stamina. Short: Try level 3 on the recumbent bike and to push for more laps on the track. Long: Continue exercise to improve strength and stamina. Short: Return to rehab when able to. Long: Continue exercise to improve strength and stamina Short: Return to rehab when able to. Long: Continue exercise to improve strength and stamina             Discharge Exercise Prescription (Final Exercise Prescription Changes):  Exercise Prescription Changes - 07/16/23 0700       Response to Exercise   Blood Pressure (Admit) 118/62    Blood Pressure (Exercise) 120/68    Blood Pressure (Exit) 92/58    Heart Rate (Admit) 67 bpm    Heart Rate (Exercise) 97 bpm    Heart Rate (Exit) 76 bpm    Rating of Perceived Exertion (Exercise) 13    Symptoms none    Duration Continue with 30 min of aerobic exercise without signs/symptoms of physical distress.    Intensity THRR unchanged      Progression   Progression Continue to progress workloads to maintain intensity without signs/symptoms of physical distress.    Average METs 1.89      Resistance Training   Training Prescription Yes    Weight 4    Reps 10-15      Interval Training   Interval Training No      Treadmill   MPH 1.3    Grade 0    Minutes 15    METs 1.99      Recumbant Bike   Level 2    Watts 16    Minutes 15      NuStep   Level 3    Minutes 15    METs 2.2      Arm Ergometer   Level 1    Minutes 15    METs 1.54      T5 Nustep   Level 4   T6   SPM 80    Minutes 15    METs 2.3      Biostep-RELP   Level 1    Minutes 15    METs 2      Track   Laps 11    Minutes 15    METs 1.6      Home Exercise Plan   Plans to continue  exercise at [employer] (comment)   [employer]   Frequency Add 2 additional days to program exercise sessions.    Initial Home Exercises Provided 06/17/23      Oxygen   Maintain Oxygen Saturation 88% or higher             Nutrition:  Target Goals: Understanding of nutrition guidelines, daily intake of sodium 1500mg , cholesterol 200mg , calories 30% from fat and 7% or less from saturated fats, daily to have 5 or more servings of fruits and vegetables.  Education: All About Nutrition: -Group instruction provided by verbal, written material, interactive activities, discussions, models, and posters to present general guidelines for heart healthy nutrition including fat, fiber, MyPlate, the role of sodium in heart healthy nutrition, utilization of the nutrition label, and utilization of this knowledge for meal planning. Follow up email sent as well. Written material given at graduation.   Biometrics:  Pre Biometrics - 04/27/23 1546       Pre Biometrics   Height 6\' 1"  (1.854 m)    Weight 209 lb 12.8 oz (95.2 kg)    Waist Circumference 43.5 inches    Hip Circumference 41 inches    Waist to Hip Ratio 1.06 %    BMI (Calculated) 27.69    Single Leg Stand 2.12 seconds              Nutrition Therapy Plan and Nutrition Goals:  Nutrition Therapy & Goals - 06/29/23 1304       Nutrition Therapy   RD appointment deferred Yes             Nutrition Assessments:  MEDIFICTS Score Key: >=70 Need to make dietary changes  40-70 Heart Healthy Diet <= 40 Therapeutic Level Cholesterol Diet  Flowsheet Row Cardiac Rehab from 05/04/2023 in [facility] Cardiac and Pulmonary Rehab  Picture Your Plate Total Score on Admission 44      Picture Your Plate Scores: <16 Unhealthy dietary pattern with much room for improvement. 41-50 Dietary pattern unlikely to meet recommendations for good health and room for improvement. 51-60 More healthful dietary pattern, with some room for  improvement.  >60 Healthy dietary pattern, although there may be some specific behaviors that could be improved.    Nutrition Goals Re-Evaluation:   Nutrition Goals Discharge (Final Nutrition Goals Re-Evaluation):   Psychosocial: Target Goals: Acknowledge presence or absence of significant depression and/or stress, maximize coping skills, provide positive support system. Participant is able to verbalize types and ability to use techniques and skills needed for reducing stress and depression.   Education: Stress, Anxiety, and Depression - Group verbal and visual presentation to define topics covered.  Reviews how body is impacted by stress, anxiety, and depression.  Also discusses healthy ways to reduce stress and to treat/manage anxiety and depression.  Written material given at graduation.   Education: Sleep Hygiene -Provides group verbal and written instruction about how sleep can affect your health.  Define sleep hygiene, discuss sleep cycles and impact of sleep habits. Review good sleep hygiene tips.    Initial Review & Psychosocial Screening:  Initial Psych Review & Screening - 04/23/23 1502       Initial Review   Current issues with Current Stress Concerns    Source of Stress Concerns Unable to participate in former interests or hobbies;Unable to perform yard/household activities      Family Dynamics   Good Support System? Yes      Barriers   Psychosocial barriers to participate in program There are no identifiable barriers or psychosocial needs.;The patient should benefit from training in stress management and relaxation.      Screening Interventions   Interventions Encouraged to exercise;Provide feedback about the scores to participant;To provide support and resources with identified psychosocial needs    Expected Outcomes Short Term goal: Utilizing psychosocial counselor, staff and  physician to assist with identification of specific Stressors or current issues  interfering with healing process. Setting desired goal for each stressor or current issue identified.;Long Term Goal: Stressors or current issues are controlled or eliminated.;Short Term goal: Identification and review with participant of any Quality of Life or Depression concerns found by scoring the questionnaire.;Long Term goal: The participant improves quality of Life and PHQ9 Scores as seen by post scores and/or verbalization of changes             Quality of Life Scores:   Quality of Life - 05/04/23 1422       Quality of Life   Select Quality of Life      Quality of Life Scores   Health/Function Pre 23.09 %    Socioeconomic Pre 22.32 %    Psych/Spiritual Pre 23.5 %    Family Pre 21.07 %    GLOBAL Pre 28.5 %            Scores of 19 and below usually indicate a poorer quality of life in these areas.  A difference of  2-3 points is a clinically meaningful difference.  A difference of 2-3 points in the total score of the Quality of Life Index has been associated with significant improvement in overall quality of life, self-image, physical symptoms, and general health in studies assessing change in quality of life.  PHQ-9: Review Flowsheet  More data exists      07/15/2023 04/27/2023 12/04/2022 08/01/2022 01/20/2022  Depression screen PHQ 2/9  Decreased Interest 0 0 0 0 0  Down, Depressed, Hopeless 0 0 0 0 0  PHQ - 2 Score 0 0 0 0 0  Altered sleeping - 1 - - -  Tired, decreased energy - 1 - - -  Change in appetite - 0 - - -  Feeling bad or failure about yourself  - 0 - - -  Trouble concentrating - 0 - - -  Moving slowly or fidgety/restless - 0 - - -  Suicidal thoughts - 0 - - -  PHQ-9 Score - 2 - - -  Difficult doing work/chores - Not difficult at all - - -   Interpretation of Total Score  Total Score Depression Severity:  1-4 = Minimal depression, 5-9 = Mild depression, 10-14 = Moderate depression, 15-19 = Moderately severe depression, 20-27 = Severe depression    Psychosocial Evaluation and Intervention:  Psychosocial Evaluation - 04/23/23 1506       Psychosocial Evaluation & Interventions   Interventions Encouraged to exercise with the program and follow exercise prescription    Comments Mr. 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 He has had a long recovery related to balance and walking. He has spent 22 days in a rehab facility and completed [facility] rehab. He is hesitant about the program because he is unsure what more rehab will do for him. We discussed how this is a more independent program that will help him strengthen his balance and regain muscle. He wants to work on his independence so he can maintain it.    Expected Outcomes Short: attend cardiac rehab for education and exercise. Long: develop and maintain positive self care habits    Continue Psychosocial Services  Follow up required by staff             Psychosocial Re-Evaluation:  Psychosocial Re-Evaluation     Row Name 06/29/23 1304             Psychosocial  Re-Evaluation   Current issues with Current Stress Concerns       Comments Roe Coombs states he is still attending the program because he is hopeful it will help him feel better. Some of his initial concerns about the program have been resolved as he is more inclined to attend the program since it is helping his independence. He still has health concerns that have not resolved but is planning on talking to his pcp in March. Discussed importance of education in program to help with maintaining a positive life balance       Expected Outcomes Short: attend program consistently for  maximum benefit  Long: manage mental health habits       Continue Psychosocial Services  Follow up required by staff                Psychosocial Discharge (Final Psychosocial Re-Evaluation):  Psychosocial Re-Evaluation - 06/29/23 1304       Psychosocial Re-Evaluation   Current issues with Current Stress Concerns    Comments Roe Coombs states he  is still attending the program because he is hopeful it will help him feel better. Some of his initial concerns about the program have been resolved as he is more inclined to attend the program since it is helping his independence. He still has health concerns that have not resolved but is planning on talking to his pcp in March. Discussed importance of education in program to help with maintaining a positive life balance    Expected Outcomes Short: attend program consistently for  maximum benefit  Long: manage mental health habits    Continue Psychosocial Services  Follow up required by staff             Vocational Rehabilitation: Provide vocational rehab assistance to qualifying candidates.   Vocational Rehab Evaluation & Intervention:  Vocational Rehab - 04/23/23 1502       Initial Vocational Rehab Evaluation & Intervention   Assessment shows need for Vocational Rehabilitation No             Education: Education Goals: Education classes will be provided on a variety of topics geared toward better understanding of heart health and risk factor modification. Participant will state understanding/return demonstration of topics presented as noted by education test scores.  Learning Barriers/Preferences:  Learning Barriers/Preferences - 04/23/23 1502       Learning Barriers/Preferences   Learning Barriers None    Learning Preferences Individual Instruction             General Cardiac Education Topics:  AED/CPR: - Group verbal and written instruction with the use of models to demonstrate the basic use of the AED with the basic ABC's of resuscitation.   Anatomy and Cardiac Procedures: - Group verbal and visual presentation and models provide information about basic cardiac anatomy and function. Reviews the testing methods done to diagnose heart disease and the outcomes of the test results. Describes the treatment choices: Medical Management, Angioplasty, or Coronary Bypass  Surgery for treating various heart conditions including Myocardial Infarction, Angina, Valve Disease, and Cardiac Arrhythmias.  Written material given at graduation. Flowsheet Row Cardiac Rehab from 06/10/2023 in [facility] Cardiac and Pulmonary Rehab  Date 06/10/23  Educator sb  Instruction Review Code 1- Verbalizes Understanding       Medication Safety: - Group verbal and visual instruction to review commonly prescribed medications for heart and lung disease. Reviews the medication, class of the drug, and side effects. Includes the steps to properly store meds and maintain  the prescription regimen.  Written material given at graduation.   Intimacy: - Group verbal instruction through game format to discuss how heart and lung disease can affect sexual intimacy. Written material given at graduation..   Know Your Numbers and Heart Failure: - Group verbal and visual instruction to discuss disease risk factors for cardiac and pulmonary disease and treatment options.  Reviews associated critical values for Overweight/Obesity, Hypertension, Cholesterol, and Diabetes.  Discusses basics of heart failure: signs/symptoms and treatments.  Introduces Heart Failure Zone chart for action plan for heart failure.  Written material given at graduation.   Infection Prevention: - Provides verbal and written material to individual with discussion of infection control including proper hand washing and proper equipment cleaning during exercise session. Flowsheet Row Cardiac Rehab from 06/10/2023 in [facility] Cardiac and Pulmonary Rehab  Date 04/27/23  Educator [facility]  Instruction Review Code 1- Verbalizes Understanding       Falls Prevention: - Provides verbal and written material to individual with discussion of falls prevention and safety. Flowsheet Row Cardiac Rehab from 06/10/2023 in [facility] Cardiac and Pulmonary Rehab  Date 04/27/23  Educator [facility]  Instruction Review Code 1- Verbalizes Understanding        Other: -Provides group and verbal instruction on various topics (see comments)   Knowledge Questionnaire Score:  Knowledge Questionnaire Score - 05/04/23 1424       Knowledge Questionnaire Score   Pre Score 22/26nutrition, exercie   HF             Core Components/Risk Factors/Patient Goals at Admission:  Personal Goals and Risk Factors at Admission - 04/27/23 1548       Core Components/Risk Factors/Patient Goals on Admission    Weight Management Yes;Weight Maintenance    Intervention Weight Management: Develop a combined nutrition and exercise program designed to reach desired caloric intake, while maintaining appropriate intake of nutrient and fiber, sodium and fats, and appropriate energy expenditure required for the weight goal.;Weight Management: Provide education and appropriate resources to help participant work on and attain dietary goals.;Weight Management/Obesity: Establish reasonable short term and long term weight goals.    Admit Weight 209 lb 12.8 oz (95.2 kg)    Goal Weight: Short Term 210 lb (95.3 kg)    Goal Weight: Long Term 210 lb (95.3 kg)    Expected Outcomes Short Term: Continue to assess and modify interventions until short term weight is achieved;Long Term: Adherence to nutrition and physical activity/exercise program aimed toward attainment of established weight goal;Weight Maintenance: Understanding of the daily nutrition guidelines, which includes 25-35% calories from fat, 7% or less cal from saturated fats, less than 200mg  cholesterol, less than 1.5gm of sodium, & 5 or more servings of fruits and vegetables daily;Understanding recommendations for meals to include 15-35% energy as protein, 25-35% energy from fat, 35-60% energy from carbohydrates, less than 200mg  of dietary cholesterol, 20-35 gm of total fiber daily;Understanding of distribution of calorie intake throughout the day with the consumption of 4-5 meals/snacks    Diabetes Yes    Intervention  Provide education about proper nutrition, including hydration, and aerobic/resistive exercise prescription along with prescribed medications to achieve blood glucose in normal ranges: Fasting glucose 65-99 mg/dL;Provide education about signs/symptoms and action to take for hypo/hyperglycemia.    Expected Outcomes Short Term: Participant verbalizes understanding of the signs/symptoms and immediate care of hyper/hypoglycemia, proper foot care and importance of medication, aerobic/resistive exercise and nutrition plan for blood glucose control.;Long Term: Attainment of HbA1C < 7%.    Hypertension Yes  Intervention Provide education on lifestyle modifcations including regular physical activity/exercise, weight management, moderate sodium restriction and increased consumption of fresh fruit, vegetables, and low fat dairy, alcohol moderation, and smoking cessation.;Monitor prescription use compliance.    Expected Outcomes Short Term: Continued assessment and intervention until BP is < 140/4mm HG in hypertensive participants. < 130/32mm HG in hypertensive participants with diabetes, heart failure or chronic kidney disease.;Long Term: Maintenance of blood pressure at goal levels.             Education:Diabetes - Individual verbal and written instruction to review signs/symptoms of diabetes, desired ranges of glucose level fasting, after meals and with exercise. Acknowledge that pre and post exercise glucose checks will be done for 3 sessions at entry of program. Flowsheet Row Cardiac Rehab from 06/10/2023 in [facility] Cardiac and Pulmonary Rehab  Date 04/27/23  Educator [facility]  Instruction Review Code 1- Verbalizes Understanding       Core Components/Risk Factors/Patient Goals Review:   Goals and Risk Factor Review     Row Name 06/29/23 1310             Core Components/Risk Factors/Patient Goals Review   Personal Goals Review Hypertension;Diabetes       Review Jonell Neptune states his blood pressure has been  stable since his CABG. He is no longer on blood presure medication. His CBG has been consistent and he has been sticking to a consistent diet as well as adhering to his medications. His biggest concern is when he walks sometimes he feels "wobbly in the head" He has been encouraged to check his blood pressure during these spells as well as his blood sugar to help his doctor rule out certain things. He says he will bring a log to his doctor appointment in March       Expected Outcomes Short: keep log of blood pressures and blood sugars during these spells. Long: independenlty manage risk factors.                Core Components/Risk Factors/Patient Goals at Discharge (Final Review):   Goals and Risk Factor Review - 06/29/23 1310       Core Components/Risk Factors/Patient Goals Review   Personal Goals Review Hypertension;Diabetes    Review Jonell Neptune states his blood pressure has been stable since his CABG. He is no longer on blood presure medication. His CBG has been consistent and he has been sticking to a consistent diet as well as adhering to his medications. His biggest concern is when he walks sometimes he feels "wobbly in the head" He has been encouraged to check his blood pressure during these spells as well as his blood sugar to help his doctor rule out certain things. He says he will bring a log to his doctor appointment in March    Expected Outcomes Short: keep log of blood pressures and blood sugars during these spells. Long: independenlty manage risk factors.             ITP Comments:  ITP Comments     Row Name 04/23/23 1500 04/27/23 1541 04/29/23 0944 05/04/23 1335 05/27/23 1251   ITP Comments Initial phone call completed. Diagnosis can be found in [facility] 12/4. EP Orientation scheduled for Monday 12/16 at 1:30. Completed and gym orientation. Initial ITP created and sent for review to Dr. Firman Hughes, Medical Director. 30 Day review completed. Medical Director ITP review done, changes  made as directed, and signed approval by Medical Director.    new to program First full  day of exercise!  Patient was oriented to gym and equipment including functions, settings, policies, and procedures.  Patient's individual exercise prescription and treatment plan were reviewed.  All starting workloads were established based on the results of the 6 minute walk test done at initial orientation visit.  The plan for exercise progression was also introduced and progression will be customized based on patient's performance and goals. 30 Day review completed. Medical Director ITP review done, changes made as directed, and signed approval by Medical Director.    new to program    Row Name 06/24/23 0750 07/22/23 0856 08/19/23 0740 08/27/23 0953     ITP Comments 30 Day review completed. Medical Director ITP review done, changes made as directed, and signed approval by Medical Director. 30 Day review completed. Medical Director ITP review done, changes made as directed, and signed approval by Medical Director. 30 Day review completed. Medical Director ITP review done, changes made as directed, and signed approval by Medical Director.     absent  since 2/26 has been called   no response yet Darryle graduated today from  rehab with 15 sessions completed.  Details of the patient's exercise prescription and what He needs to do in order to continue the prescription and progress were discussed with patient.  Patient was given a copy of prescription and goals.  Patient verbalized understanding. Wiley  has been absent since 2/26 without responding to calls. He has been discharged.             Comments: Discharge ITP

## 2023-08-27 NOTE — Progress Notes (Signed)
 Discharge Note for  Jon [name]     [birth year]             Jon [name] graduated today from  rehab with 15 sessions completed.  Details of the patient's exercise prescription and what Jon [name] needs to do in order to continue the prescription and progress were discussed with patient.  Patient was given a copy of prescription and goals.  Patient verbalized understanding. Jon [name]  has been absent since 2/26 without responding to calls. Jon [name] has been discharged.      6 Minute Walk     Row Name 04/27/23 1541         6 Minute Walk   Phase Initial     Distance 765 feet     Walk Time 6 minutes     # of Rest Breaks 0     MPH 1.45     METS 1.49     RPE 12     Perceived Dyspnea  1     VO2 Peak 5.2     Symptoms No     Resting HR 71 bpm     Resting BP 124/78     Resting Oxygen Saturation  98 %     Exercise Oxygen Saturation  during 6 min walk 99 %     Max Ex. HR 83 bpm     Max Ex. BP 120/82     2 Minute Post BP 122/82

## 2023-08-28 ENCOUNTER — Ambulatory Visit: Payer: Medicare Other

## 2023-08-31 ENCOUNTER — Telehealth: Payer: Self-pay | Admitting: Family Medicine

## 2023-08-31 MED ORDER — ACCU-CHEK GUIDE TEST VI STRP: ORAL_STRIP | 99 refills | Status: DC

## 2023-08-31 NOTE — Telephone Encounter (Signed)
 Copied from CRM [id number]. Topic: Clinical - Medication Refill >> Aug 31, 2023 12:59 PM Maryln Sober wrote: Most Recent Primary Care Visit:  Provider: Duaine German D  Department: [facility]  Visit Type: OFFICE VISIT  Date: 08/17/2023  Medication: glucose blood (ACCU-CHEK GUIDE) test strip   Has the patient contacted their pharmacy? Yes (Agent: If no, request that the patient contact the pharmacy for the refill. If patient does not wish to contact the pharmacy document the reason why and proceed with request.) (Agent: If yes, when and what did the pharmacy advise?)  Is this the correct pharmacy for this prescription? Yes If no, delete pharmacy and type the correct one.  This is the patient's preferred pharmacy:   [facility] [address] ([address] [place] - [address] [place] ([place]) [place] [address] Phone: [phone] Fax: [phone]   Has the prescription been filled recently? No  Is the patient out of the medication? Yes  Has the patient been seen for an appointment in the last year OR does the patient have an upcoming appointment? Yes  Can we respond through MyChart? Yes  Agent: Please be advised that Rx refills may take up to 3 business days. We ask that you follow-up with your pharmacy.

## 2023-08-31 NOTE — Telephone Encounter (Signed)
Test strips sent to pharmacy.

## 2023-09-04 ENCOUNTER — Ambulatory Visit: Payer: Medicare Other

## 2023-09-11 ENCOUNTER — Ambulatory Visit: Payer: Medicare Other

## 2023-09-14 ENCOUNTER — Telehealth: Payer: Self-pay

## 2023-09-14 DIAGNOSIS — E119 Type 2 diabetes mellitus without complications: Secondary | ICD-10-CM

## 2023-09-14 MED ORDER — METOPROLOL SUCCINATE ER 25 MG PO TB24: 25.0000 mg | ORAL_TABLET | Freq: Every day | ORAL | 1 refills | Status: DC

## 2023-09-14 MED ORDER — ACCU-CHEK GUIDE TEST VI STRP: ORAL_STRIP | 99 refills | Status: DC

## 2023-09-14 NOTE — Telephone Encounter (Signed)
 Copied from CRM [id number]. Topic: General - Other >> Sep 14, 2023 10:48 AM Shelby Dessert H wrote: Reason for CRM: Patient is wanting to speak to Joven Smeby only and is requesting a call back, patients callback number is [phone]

## 2023-09-17 ENCOUNTER — Encounter (HOSPITAL_COMMUNITY): Payer: Self-pay

## 2023-09-18 ENCOUNTER — Ambulatory Visit: Payer: Medicare Other

## 2023-09-24 ENCOUNTER — Encounter: Payer: Self-pay | Admitting: Family Medicine

## 2023-09-24 ENCOUNTER — Ambulatory Visit (INDEPENDENT_AMBULATORY_CARE_PROVIDER_SITE_OTHER): Admitting: Family Medicine

## 2023-09-24 VITALS — BP 109/64 | HR 66 | Ht 73.0 in | Wt 216.0 lb

## 2023-09-24 DIAGNOSIS — E1122 Type 2 diabetes mellitus with diabetic chronic kidney disease: Secondary | ICD-10-CM | POA: Diagnosis not present

## 2023-09-24 DIAGNOSIS — F515 Nightmare disorder: Secondary | ICD-10-CM | POA: Diagnosis not present

## 2023-09-24 DIAGNOSIS — H9193 Unspecified hearing loss, bilateral: Secondary | ICD-10-CM | POA: Diagnosis not present

## 2023-09-24 DIAGNOSIS — R531 Weakness: Secondary | ICD-10-CM

## 2023-09-24 DIAGNOSIS — N183 Chronic kidney disease, stage 3 unspecified: Secondary | ICD-10-CM

## 2023-09-24 DIAGNOSIS — K5900 Constipation, unspecified: Secondary | ICD-10-CM | POA: Insufficient documentation

## 2023-09-24 DIAGNOSIS — G479 Sleep disorder, unspecified: Secondary | ICD-10-CM

## 2023-09-24 DIAGNOSIS — R413 Other amnesia: Secondary | ICD-10-CM

## 2023-09-24 DIAGNOSIS — D692 Other nonthrombocytopenic purpura: Secondary | ICD-10-CM

## 2023-09-24 NOTE — Assessment & Plan Note (Signed)
 Reassured him this is from aging.

## 2023-09-24 NOTE — Patient Instructions (Signed)
 Recommend taking a stool softener 3 times a day with meals.  Recommend 1 capful of MiraLAX  mixed with 6 ounces of water at bedtime every night.  Continue with both of these for at least 2 to 3 weeks to try to see if we cannot get your bowels more regulated if you are still having issues with constipation at that point then let me know.  If you feel like your bowels are getting too loose Jon [name] let me know and we can make an adjustment to the regimen.  I will go ahead and place a referral for formal hearing testing.  I increasing your trazodone  to 1-1/2 tabs at night to see if that helps with the nightmares.  If they are not improving over the next 2 to 3 weeks then let me know and I can refer you to a sleep medicine specialist.  Call me in a week and let me know if your legs are feeling better in regards to weakness after holding your statin for a week.

## 2023-09-24 NOTE — Assessment & Plan Note (Signed)
 Does have some generalized weakness but particularly in his legs.  He is gena hold his statin completely for the next week.  If it is a noticeable improvement then he can call me back and let me know.  If there is no significant improvement then I did encourage him to restart the statin as it is extremely important that we have him on treatment because of his CABG.  But I do want a make sure that it is not causing some of his symptoms.

## 2023-09-24 NOTE — Assessment & Plan Note (Signed)
 Trazodone  has been really helpful in helping him sleep as well as reducing his nightmares but he has had an increase in symptoms recently. He says he will fall asleep and then wakes up in the melanite and has a hard time falling asleep and then once he falls back asleep he starts to have nightmares again. Will try increasing dose to 75 mg nightly.

## 2023-09-24 NOTE — Progress Notes (Signed)
 Established Patient Office Visit  Subjective  Patient ID: Jon [name], male    DOB: Mar 19, 1946  Age: 78 y.o. MRN: [id number]  Chief Complaint  Patient presents with   Constipation    HPI  He has several concerns he would like to discuss today some of them are issues that are follow-up from his previous visit last month  nightmares-he had really noticed a ramp up in his nightmares and sort of screaming out in the night.  A couple times his wife has gone in and noticed him standing and then gotten him back in bed.  He had originally been started on trazodone  for his nightmares years ago and it actually was really helpful so we had discussed maybe bumping his dose to just 75 mg for a couple weeks just to see if it was helpful but he did not adjust his dose he is still taking the regular dose.  He is also still been struggling with constipation he did pick up an over-the-counter stool softener and has started that recently in the last week or so some bowel movements are a week apart and he says sometimes things become so impacted that he has to manually disimpact himself.  He still struggling with his memory we had addressed that at the last visit as well  Still having significant leg weakness.  We had discussed cutting his statin in half and taking a half a tab daily he said he did do that for 2 weeks and then he actually stopped the statin completely a couple of days ago to see if that is helpful.  Sometimes he still gets a little puffiness and tenderness on the scar on his chest from the old incision but otherwise it looks okay.  He gets a lot of bruising on his hands and forearms.  Times he has difficulty making out people's voices he is not sure if it is hearing or processing issue.    ROS    Objective:     BP 109/64   Pulse 66   Ht 6\' 1"  (1.854 m)   Wt 216 lb 0.6 oz (98 kg)   SpO2 100%   BMI 28.50 kg/m    Physical Exam   No results found for any visits on  09/24/23.    The ASCVD Risk score (Arnett DK, et al., 2019) failed to calculate for the following reasons:   Risk score cannot be calculated because patient has a medical history suggesting prior/existing ASCVD    Assessment & Plan:   Problem List Items Addressed This Visit       Cardiovascular and Mediastinum   Senile purpura (HCC) (Chronic)   Reassured him this is from aging.          Endocrine   CKD stage 3 due to type 2 diabetes mellitus (HCC) (Chronic)   Relevant Orders   Urine Microalbumin w/creat. ratio     Other   Weakness generalized   Does have some generalized weakness but particularly in his legs.  He is gena hold his statin completely for the next week.  If it is a noticeable improvement then he can call me back and let me know.  If there is no significant improvement then I did encourage him to restart the statin as it is extremely important that we have him on treatment because of his CABG.  But I do want a make sure that it is not causing some of his symptoms.  Nightmares - Primary   Trazodone  has been really helpful in helping him sleep as well as reducing his nightmares but he has had an increase in symptoms recently. He says he will fall asleep and then wakes up in the melanite and has a hard time falling asleep and then once he falls back asleep he starts to have nightmares again. Will try increasing dose to 75 mg nightly.       Relevant Orders   Ambulatory referral to Pulmonology   Memory impairment   Also wanted to do a trial of holding the statin just to see if that made an improvement if not then we will plan to repeat MoCA at follow-up.      Constipation   Trazodone  has been really helpful in helping him sleep as well as reducing his nightmares but he has had an increase in symptoms recently. He says he will fall asleep and then wakes up in the melanite and has a hard time falling asleep and then once he falls back asleep he starts to have  nightmares again. Will try increasing dose to 75 mg nightly.       Other Visit Diagnoses       Bilateral hearing loss, unspecified hearing loss type       Relevant Orders   Ambulatory referral to Audiology     Sleep disturbance       Relevant Orders   Ambulatory referral to Pulmonology       Return in about 2 months (around 11/24/2023) for legs and statin and constipation .    Duaine German, MD

## 2023-09-24 NOTE — Assessment & Plan Note (Signed)
 Also wanted to do a trial of holding the statin just to see if that made an improvement if not then we will plan to repeat MoCA at follow-up.

## 2023-09-25 ENCOUNTER — Ambulatory Visit: Payer: Self-pay | Admitting: Family Medicine

## 2023-09-25 ENCOUNTER — Ambulatory Visit: Payer: Medicare Other

## 2023-09-25 LAB — MICROALBUMIN / CREATININE URINE RATIO
Creatinine, Urine: 53 mg/dL
Microalb/Creat Ratio: <6 mg/g{creat} (ref 0–29)
Microalbumin, Urine: <3 ug/mL

## 2023-09-25 LAB — SPECIMEN STATUS REPORT

## 2023-09-25 NOTE — Progress Notes (Signed)
 No excess protein in the urine.

## 2023-09-28 ENCOUNTER — Telehealth: Payer: Self-pay

## 2023-09-28 DIAGNOSIS — M545 Low back pain, unspecified: Secondary | ICD-10-CM

## 2023-09-28 NOTE — Telephone Encounter (Signed)
 I would recommend we can start with PT and xrays.  I will order xrays for [place].  Is is worse on one side vs the other?   [facility] [address]

## 2023-09-28 NOTE — Telephone Encounter (Signed)
 Attempted call to patient. Left a detailed voice mail message requesting a return call to answer provider question as to if low back ache/ discomfort is worse on one side vs the other?

## 2023-09-28 NOTE — Telephone Encounter (Signed)
 Patient states saw Dr. Greer Leak about one week ago and discussed that he was having lower back pain( more like an ache) / causing discomfort with walking.  He would like to know what can be done for this? What is Dr. Trenia Fritter recommendations?

## 2023-09-29 ENCOUNTER — Telehealth: Payer: Self-pay

## 2023-09-29 ENCOUNTER — Ambulatory Visit
Admission: RE | Admit: 2023-09-29 | Discharge: 2023-09-29 | Disposition: A | Discharge status: Home / Self Care | Source: Ambulatory Visit | Attending: Family Medicine | Admitting: Family Medicine

## 2023-09-29 DIAGNOSIS — M545 Low back pain, unspecified: Secondary | ICD-10-CM

## 2023-09-29 DIAGNOSIS — M47816 Spondylosis without myelopathy or radiculopathy, lumbar region: Secondary | ICD-10-CM | POA: Diagnosis not present

## 2023-09-29 NOTE — Telephone Encounter (Signed)
 Orders Placed This Encounter  Procedures   DG Lumbar Spine Complete    Standing Status:   Future    Number of Occurrences:   1    Expiration Date:   09/27/2024    Reason for Exam (SYMPTOM  OR DIAGNOSIS REQUIRED):   low back pain    Preferred imaging location?:   [facility]   Ambulatory referral to Physical Therapy    Referral Priority:   Routine    Referral Type:   Physical Medicine    Referral Reason:   Specialty Services Required    Requested Specialty:   Physical Therapy    Number of Visits Requested:   1

## 2023-09-29 NOTE — Addendum Note (Signed)
 Addended by: Kavari Parrillo D on: 09/29/2023 04:28 PM   Modules accepted: Orders

## 2023-09-29 NOTE — Telephone Encounter (Signed)
 Patient informed. Will have x-ray done today.  He would like PT referral sent to  [facility]  [address] [place]

## 2023-09-29 NOTE — Telephone Encounter (Signed)
 Pended referral for PT to patient's preferred location  Please check that DX code is what is needed for this referral before sending

## 2023-09-30 ENCOUNTER — Telehealth: Payer: Self-pay | Admitting: Family Medicine

## 2023-09-30 ENCOUNTER — Telehealth (HOSPITAL_COMMUNITY): Payer: Self-pay | Admitting: *Deleted

## 2023-09-30 ENCOUNTER — Other Ambulatory Visit: Payer: Self-pay | Admitting: Family Medicine

## 2023-09-30 DIAGNOSIS — M545 Low back pain, unspecified: Secondary | ICD-10-CM

## 2023-09-30 NOTE — Telephone Encounter (Signed)
 Thanks, the address online went with that facility

## 2023-09-30 NOTE — Telephone Encounter (Signed)
 Received referral from Dr. Greer Leak for this pt for physical therapy placed under the service of cardiac rehab.  See that this pt finished cardiac rehab at [facility] this past April. Called pt for better understanding of what his needs are and what service we provide here at [facility].  Spoke with Jon [name] who is looking for physical therapy specific for his lower back pain.  Jon [name] that this is not a service we offer.  We are a group exercise program for pt with recent cardiac events for overall aerobic activity along with strength training with emphasis on education and risk factor reduction.  Jon [name] significant other participated in cardiac rehab here at [facility] and this is where he got the idea. Called to let Dr. Greer Leak office know he will need a referral to Outpatient physical therapyclinic.  Jon [name] open to aquatic therapy a [facility] as possible option.  Waiting on xray results and wonders if he needs to be referred to back specialist/orthopedic/sports medicine.  Thanked me for calling. Lettie Ray, BSN Cardiac and Emergency planning/management officer

## 2023-09-30 NOTE — Telephone Encounter (Signed)
 Copied from CRM [id number]. Topic: Referral - Status >> Sep 30, 2023  3:50 PM Blair Bumpers wrote: Reason for CRM: Carlette, with [facility], called stating that they received a referral for patient to participate in physical therapy but that is not a service that they offer.

## 2023-09-30 NOTE — Progress Notes (Signed)
 Orders Placed This Encounter  Procedures  . Ambulatory referral to Physical Therapy    Referral Priority:   Routine    Referral Type:   Physical Medicine    Referral Reason:   Specialty Services Required    Requested Specialty:   Physical Therapy    Number of Visits Requested:   1

## 2023-10-01 ENCOUNTER — Telehealth: Payer: Self-pay

## 2023-10-01 DIAGNOSIS — M545 Low back pain, unspecified: Secondary | ICD-10-CM

## 2023-10-01 NOTE — Telephone Encounter (Signed)
 Referral for PT was sent to patient's requested facility  [facility]  But this facility responded that they only do cardiac rehab ( patients wife had gone there in the past and this is why the request was made for there)  Gwinda Leopard open to aquatic therapy at [facility] as possible option.  Waiting on xray results and wonders if he needs to be referred to back specialist/orthopedic/sports medicine.    Pended a referral to PT

## 2023-10-01 NOTE — Telephone Encounter (Signed)
 Referral was placed 521

## 2023-10-06 ENCOUNTER — Telehealth: Payer: Self-pay

## 2023-10-06 NOTE — Telephone Encounter (Signed)
 Lumbar x-rays not yet resulted.  Did you want me to have this moved up for reading with radiology?

## 2023-10-06 NOTE — Telephone Encounter (Signed)
 Copied from CRM [id number]. Topic: Clinical - Lab/Test Results >> Oct 06, 2023 11:12 AM Jon [name] wrote: Reason for CRM: Patient calling to get an update on his back x-rays that he had done last week. Advised patient that Dr. Greer Leak hasn't reviewed those as of yet. Once they have been reviewed, please give patient a call. CB #: [id number].

## 2023-10-07 NOTE — Telephone Encounter (Signed)
 Yes please call and have them resulted.

## 2023-10-08 ENCOUNTER — Ambulatory Visit: Payer: Self-pay | Admitting: Family Medicine

## 2023-10-08 ENCOUNTER — Ambulatory Visit: Payer: Self-pay

## 2023-10-08 ENCOUNTER — Telehealth: Payer: Self-pay

## 2023-10-08 NOTE — Telephone Encounter (Signed)
  Chief Complaint: Imaging Results Symptoms: chronic walking issues Frequency: 19mos-1 year Additional Notes: Pt calling re: XR results. PAS successfully read response from PCP, but then was transferred to NT for further questions. Pt wanting explanation on whether reading results could cause chronic walking issues he has been experiencing. Triager was unable to answer question and offered to schedule appt with PCP for further discussion of XR results. Pt declined and stated that he has a [facility] appt next week and will get clarification then. Additionally, pt wife, [place], asking about "L4-L5". Triager explained spine anatomy and that L4-L5 is of the lumbar region of spine. Patient/Wife verbalized understanding and to call back with additional questions/if wanting to set up F/U appt with PCP to discuss results further.    Copied from CRM [id number]. Topic: Clinical - Lab/Test Results >> Oct 08, 2023 10:04 AM Carrielelia G wrote: Reason for CRM: Read patients test results to him and his has concerning questions and would like to speak with medical Reason for Disposition  General information question, no triage required and triager able to answer question  Answer Assessment - Initial Assessment Questions 1. REASON FOR CALL or QUESTION: "What is your reason for calling today?" or "How can I best help you?" or "What question do you have that I can help answer?"     Pt reports appt with rehab next week at [facility] - and is wanting clarification on XR results. Pt endorses issues with walking x 55mos-year with feelings of wanting to fall.  Protocols used: Information Only Call - No Triage-A-AH

## 2023-10-08 NOTE — Telephone Encounter (Signed)
 FYI - the patient was updated by E[facility] agent regarding their recent imaging results.

## 2023-10-08 NOTE — Progress Notes (Signed)
 Jon [name], we finally got your x-ray report back.  They are behind in reading film so I apologize.  No sign of acute fracture which is good.  You do have some arthritis at multiple levels.  No evidence of significant damage to the disc spaces which is good.  You do have some arthritis at the hinge points of your spine at L4-5 and L5-S1.  Which is good below the belt line.  Neck step at this point would be formal physical therapy.  We did update your referral so hopefully they have already contacted you to get you started.  So just a reminder to get your yearly eye exam completed this summer.  Also wanted to mention to you the lung cancer screening I know you are doing that for a while and then you ended up having the heart surgery and we can of got off track with that.  You are due to return for screening up until age 74.  If you would like me to get you connected back with [name] team let me know.

## 2023-10-08 NOTE — Telephone Encounter (Signed)
 Attempted call to patient. Left a voice mail message requesting a return call.

## 2023-10-08 NOTE — Telephone Encounter (Signed)
 Copied from CRM [id number]. Topic: Clinical - Lab/Test Results >> Oct 08, 2023 10:07 AM Madelyne Schiff wrote: Read to patient Verbatim and then connected to NT   [name], MD 10/08/2023  7:37 AM EDT  Jon [name], we finally got your x-ray report back.  They are behind in reading film so I apologize.  No sign of acute fracture which is good.  You do have some arthritis at multiple levels.  No evidence of significant damage to the disc spaces which is good.  You do have some arthritis at the hinge points of your spine at L4-5 and L5-S1.  Which is good below the belt line.  Neck step at this point would be formal physical therapy.  We did update your referral so hopefully they have already contacted you to get you started.  So just a reminder to get your yearly eye exam completed this summer.  Also wanted to mention to you the lung cancer screening I know you are doing that for a while and then you ended up having the heart surgery and we can of got off track with that.  You are due to return for screening up until age 36.  If you would like me to get you connected back with [name] team let me know.

## 2023-10-08 NOTE — Telephone Encounter (Signed)
 FYI

## 2023-10-12 NOTE — Telephone Encounter (Signed)
 Lumbar x-rays resulted. Reviewed by provider and patient was informed.

## 2023-10-14 ENCOUNTER — Telehealth: Payer: Self-pay | Admitting: Physician Assistant

## 2023-10-14 ENCOUNTER — Other Ambulatory Visit: Payer: Self-pay

## 2023-10-14 ENCOUNTER — Ambulatory Visit (HOSPITAL_BASED_OUTPATIENT_CLINIC_OR_DEPARTMENT_OTHER): Attending: Family Medicine | Admitting: Physical Therapy

## 2023-10-14 ENCOUNTER — Encounter (HOSPITAL_BASED_OUTPATIENT_CLINIC_OR_DEPARTMENT_OTHER): Payer: Self-pay | Admitting: Physical Therapy

## 2023-10-14 DIAGNOSIS — M545 Low back pain, unspecified: Secondary | ICD-10-CM | POA: Insufficient documentation

## 2023-10-14 DIAGNOSIS — M6281 Muscle weakness (generalized): Secondary | ICD-10-CM | POA: Insufficient documentation

## 2023-10-14 DIAGNOSIS — R262 Difficulty in walking, not elsewhere classified: Secondary | ICD-10-CM | POA: Insufficient documentation

## 2023-10-14 DIAGNOSIS — M5459 Other low back pain: Secondary | ICD-10-CM | POA: Diagnosis not present

## 2023-10-14 DIAGNOSIS — R293 Abnormal posture: Secondary | ICD-10-CM | POA: Insufficient documentation

## 2023-10-14 NOTE — Telephone Encounter (Signed)
 Copied from CRM [id number]. Topic: Referral - Question >> Oct 14, 2023 10:15 AM Adrianna P wrote: Reason for CRM: Patient went to rehab this morning, needs referral for cognitive decline.

## 2023-10-14 NOTE — Telephone Encounter (Signed)
 Needs to see PCP first for baseline memory testing. Please schedule appt.

## 2023-10-14 NOTE — Therapy (Addendum)
 OUTPATIENT PHYSICAL THERAPY THORACOLUMBAR EVALUATION PHYSICAL THERAPY DISCHARGE SUMMARY  Visits from Start of Care: 1  Current functional level related to goals / functional outcomes: unknown   Remaining deficits: all   Education / Equipment: Initial edu   Patient agrees to discharge. Patient goals were not met. Patient is being discharged due to called and reported he would not be returning. Reason unknown.  Addend Ronal [facility]) Jodine Muchmore MPT 01/29/24 8:33 AM [facility] [address] [place], [place], [address] Phone: [phone]   Fax:  [phone]    Patient Name: Jon [name] MRN: [id number] DOB:11-Dec-1945, 78 y.o., male Today's Date: 10/14/2023  END OF SESSION:  PT End of Session - 10/14/23 1046     Visit Number 1    Number of Visits 16    Date for PT Re-Evaluation 12/11/23    Authorization Type [employer] Medicare 2024    Authorization - Number of Visits --    Progress Note Due on Visit 10    PT Start Time 0720    PT Stop Time 0803    PT Time Calculation (min) 43 min    Activity Tolerance Patient tolerated treatment well    Behavior During Therapy [facility] for tasks assessed/performed             Past Medical History:  Diagnosis Date   BENIGN POSITIONAL VERTIGO 05/20/2010   Qualifier: Diagnosis of  By: Alvan MD, Catherine     Centrilobular emphysema [facility])    Cerebrovascular disease 07/15/2018   Prior CVA & TIA. Carotid CTA ~50% Bilateral ICA,  Multifocal severe stenosis of the left V2 vertebral artery.  Right vertebral artery patent without significant stenosis.   Coronary artery disease involving native coronary artery with angina pectoris (HCC) 11/18/2017   Severe LM, LAD & RCA stenosis indicated on Coronary CTA => CaCardiac Cath 01/02/2022: dLM ~50%; mLAD 85%@D1  90% (1,1,1) - m-dLAD 90$ @ 65% D2 (0,1,1), RI 75%, pLCx 30%-OM1 60%, pRCA 25% - p-mRCA hazy 85%. Normal LVEF by Echo, EDP 16 mmHg. = Best  option CABGrdiac Cath    Current every day smoker    No interest in quitting   Diabetes mellitus, type II, insulin  dependent (HCC)    With peripheral neuropathy, peripheral vascular disease and coronary artery disease as complications.   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 => 12/17/2021: L Subclavia A PTA=Stent with resolution of Sx.   TIA (transient ischemic attack) 11/11/2021   Brief episode of left sided arm pain and slurred speech   Past Surgical History:  Procedure Laterality Date   CORONARY ARTERY BYPASS GRAFT N/A 01/30/2023   Procedure: CORONARY ARTERY BYPASS GRAFTING TIMES THREE USING LEFT INTERNAL MAMMARY ARTERY AND ENDOHARVEST OF RIGHT LEG GREATER SAPHENOUS VEIN;  Surgeon: Lucas Dorise POUR, MD;  Location: [facility] OR;  Service: Open Heart Surgery;  Laterality: N/A;   CORONARY/GRAFT ACUTE MI REVASCULARIZATION N/A 12/11/2022   Procedure: Coronary/Graft Acute MI Revascularization;  Surgeon: Ammon Blunt, MD;  Location: [facility] INVASIVE CV LAB;  Service: Cardiovascular;  Laterality: N/A;   LEFT HEART CATH AND CORONARY ANGIOGRAPHY N/A 01/02/2022   Procedure: LEFT HEART CATH AND CORONARY ANGIOGRAPHY;  Surgeon: Anner Alm ORN, MD;  Location: [facility] INVASIVE CV LAB:: dLM ~50%; mLAD 85%@D1  90% (1,1,1) - m-dLAD  90$ @ 65% D2 (0,1,1), RI 75%, pLCx 30%-OM1 60%, pRCA 25% - p-mRCA hazy 85%. Normal LVEF by Echo, EDP 16 mmHg. = Best option CABG   LEFT HEART CATH AND CORONARY ANGIOGRAPHY N/A 12/11/2022   Procedure: LEFT HEART CATH AND CORONARY ANGIOGRAPHY;  Surgeon: Ammon Blunt, MD;  Location: [facility] INVASIVE CV LAB;  Service: Cardiovascular;  Laterality: N/A;   PILONIDAL CYST EXCISION  1960, 61, 62, 63   TEE WITHOUT CARDIOVERSION N/A 01/30/2023   Procedure:  TRANSESOPHAGEAL ECHOCARDIOGRAM;  Surgeon: Lucas Dorise POUR, MD;  Location: [facility] OR;  Service: Open Heart Surgery;  Laterality: N/A;   TRANSTHORACIC ECHOCARDIOGRAM  11/28/2021   EF 55 to 60%.  No RWMA.  Moderate asymmetric LVH-basal septal.  GR 1 DD.  Normal RV.  Normal MV.  AOV sclerosis without stenosis.  Normal RAP.   UPPER EXTREMITY ANGIOGRAPHY Left 12/17/2021   Procedure: Upper Extremity Angiography;  Surgeon: Jama Cordella MATSU, MD;  Location: [facility] INVASIVE CV LAB;  Service: Cardiovascular:: High-grade stenosis just distal to the origin of the L-Subclavian A, normal Innom A & L Carotid A => Ost-Prox L SubCl A 8x37x80 Stent - dilated with 9 mm balloon.   VASECTOMY     Zio Patch Monitor  11/2021   Predom Rhtyhm: SR - HR range 47-98 bpm, AVG 64 bpm.  3 runs of NS VT  (> 4 PVC beats): Fastest: 5 beats - HR 125-162 bpm, ~ 145 bpm, 2.1 sec; Longest with Fastest Avg-12 beats, 150-158 bpm, ~ 155 bpm, 4.6 sec.  1  5 beat Atrial Run: HR 125, Avg 120 bpm,   No Sustained Arrhythmias,   Occasional isolated PACs (2.1%) & Rare isolated PVCs (<1%).  No patient triggered events.   Patient Active Problem List   Diagnosis Date Noted   Constipation 09/24/2023   Memory impairment 08/17/2023   Former smoker 07/16/2023   History of Warthin tumor 07/16/2023    Class: History of   S/P CABG x 3 02/09/2023   Unstable angina (HCC) 01/25/2023   Weakness generalized 12/12/2022   STEMI involving oth coronary artery of inferior wall (HCC) 12/11/2022   STEMI involving right coronary artery (HCC) 12/11/2022   Nightmares 08/04/2022   Senile purpura (HCC) 01/17/2022   Abnormal CT scan of heart    Carotid stenosis 12/01/2021   Precordial pain 11/16/2021   TIA (transient ischemic attack) 11/16/2021   Postural dizziness with near syncope 03/08/2020   Subclavian arterial stenosis (HCC) 12/07/2019   Insomnia 10/01/2018   Pulmonary nodule 07/15/2018   Cerebrovascular disease 07/15/2018   History of stroke 12/02/2017    Shortness of breath on exertion 12/02/2017   Coronary artery disease involving native coronary artery with angina pectoris (HCC) 11/18/2017   Centrilobular emphysema (HCC) 11/03/2017   H/O parotidectomy 06/02/2017   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) 07/30/2016   CKD stage 3 due to type 2 diabetes mellitus (HCC) 03/22/2015   Diabetic peripheral neuropathy (HCC) 06/05/2011   History of cardiovascular disorder 02/28/2010   Diabetes mellitus, type II, insulin  dependent (HCC) 01/31/2010   Hyperlipidemia due to type 2 diabetes mellitus (HCC) 01/31/2010   Essential hypertension 01/31/2010    PCP: Alvan Dorothyann BIRCH, MD  REFERRING PROVIDER:   Alvan Dorothyann BIRCH, MD    REFERRING DIAG: M54.50 (ICD-10-CM) - Acute midline low back pain, unspecified whether sciatica present   Rationale for Evaluation and Treatment: Rehabilitation  THERAPY DIAG:  Muscle weakness (generalized)  Abnormal posture  Difficulty in walking, not  elsewhere classified  Other low back pain  ONSET DATE: 1 month  SUBJECTIVE:                                                                                                                                                                                           SUBJECTIVE STATEMENT: Triple bypass last sept.  Got OOB 1 month ago had all of the sudden horrible time getting up.. When I tried to walk it was pushing me left or right and going forward. I had a couple of falls. Went to md had xrays done and was told I have OA.  My legs get weak when I stand for just a few minutes I have to sit .  The front of my thighs get weak.  PERTINENT HISTORY:  Pt seen for same dx 1 year ago.  Did not return for follow up session.  Hx of dementia as per last PT note.  PAIN:  Are you having pain? no PRECAUTIONS: Fall  RED FLAGS: None   WEIGHT BEARING RESTRICTIONS: No  FALLS:  Has patient fallen in last 6 months?  Yes. Number of falls 5 as per wife  LIVING ENVIRONMENT: Lives with: lives with their family Lives in: House/apartment Stairs: No Has following equipment at home: Environmental consultant - 4 wheeled  OCCUPATION: retired  PLOF: Independent with basic ADLs  PATIENT GOALS: be able to walk  NEXT MD VISIT: as needed  OBJECTIVE:  Note: Objective measures were completed at Evaluation unless otherwise noted.  DIAGNOSTIC FINDINGS:  Lumbar x-ray IMPRESSION: 1. No acute fracture or traumatic listhesis. 2. Mild multilevel spondylosis and facet arthropathy.  PATIENT SURVEYS:  Modified Oswestry Pt unable to complete due to cognitive difficulties   COGNITION: Overall cognitive status: Within functional limits for tasks assessed     SENSATION: WFL   POSTURE: rounded shoulders, forward head, decreased lumbar lordosis, and flexed trunk    LUMBAR ROM:   AROM eval  Flexion   Extension   Right lateral flexion   Left lateral flexion   Right rotation   Left rotation    (Blank rows = not tested)  LOWER EXTREMITY ROM:     Active  Right eval Left eval  Hip flexion    Hip extension    Hip abduction    Hip adduction    Hip internal rotation    Hip external rotation    Knee flexion    Knee extension    Ankle dorsiflexion    Ankle plantarflexion    Ankle inversion    Ankle eversion     (Blank rows = not tested)  LOWER EXTREMITY MMT:  MMT Right eval Left eval  Hip flexion 41.2 47.4  Hip extension    Hip abduction 32.3 30.5  Hip adduction    Hip internal rotation    Hip external rotation    Knee flexion    Knee extension 44.3 46.8  Ankle dorsiflexion    Ankle plantarflexion    Ankle inversion    Ankle eversion     (Blank rows = not tested)   FUNCTIONAL TESTS:  STS from [place] : heavy ue support cga   GAIT: Distance walked: 23ft Assistive device utilized: Environmental consultant - 2 wheeled Level of assistance: CGA Comments: Pt amb 4-5 steps without fww in forward flexed posture shuffling,  complaints of ant quad weakness.  Using FWW pt stands upright, able to increase step length, tolerates increased distance and no quad weakness.  TREATMENT  Eval Self care:Posture and Optometrist  instruction                                                                                                                              PATIENT EDUCATION:  Education details: Discussed eval findings, rehab rationale, aquatic program progression/POC and pools in area. Patient is in agreement  Person educated: Patient Education method: Explanation Education comprehension: verbalized understanding  HOME EXERCISE PROGRAM: 13  63  ASSESSMENT:  CLINICAL IMPRESSION: Patient is a 77 y.o. m who was seen today for physical therapy evaluation and treatment for lbp. Diagnostics  dx Mild multilevel spondylosis and facet arthropathy. He was seen x 1 year ago for same issue without follow/up return to therapy.He is present with wife using WC to and from setting. Exam limited due to his inability to focus. Verbal redirecting minimally effective.  He appears to have strength deficits in his core/LB and hips. He is limited with standing erect possibly multifactorial due to  LBP /dysfunction(although he reports he has very little), lack of use of AD or just weakness. He is not consistently using an AD which has resulted in multiple falls.  He is impulsive. Wife instructed that he should be using a wheeled walker at all times for safety ina and out of home.  He will benefit from skilled physical therapy to improve all areas of deficits and improve safety. We can trial aquatics although not certain at this point if he is better suited for land based for gait training with ad, safety instruction and strengthening initially.  Will plan to alternate for now with intention to use the benefits of water to initiate strengthening and posture/gait training. He does require mod assist with all ADL's and functional mobility.  Of  note: he does report some SOB with minimal exertion (pulse and O2 sats monitored and WFL throughout) also memory issues.  OBJECTIVE IMPAIRMENTS: Abnormal gait, cardiopulmonary status limiting activity, decreased activity tolerance, decreased balance, decreased cognition, decreased coordination, decreased endurance, decreased knowledge of condition, decreased knowledge of use of DME, decreased mobility, difficulty walking, decreased strength, decreased safety awareness, postural  dysfunction, and pain.   ACTIVITY LIMITATIONS: carrying, lifting, bending, sitting, standing, squatting, stairs, transfers, bathing, and locomotion level  PARTICIPATION LIMITATIONS: cleaning, laundry, medication management, personal finances, driving, shopping, community activity, and yard work  PERSONAL FACTORS: Age, Behavior pattern, Fitness, Past/current experiences, Time since onset of injury/illness/exacerbation, and 1-2 comorbidities: See Pmhx are also affecting patient's functional outcome.   REHAB POTENTIAL: Good  CLINICAL DECISION MAKING: Evolving/moderate complexity  EVALUATION COMPLEXITY: Moderate   GOALS: Goals reviewed with patient? No  SHORT TERM GOALS: Target date: 11/11/23  Pt will tolerate full aquatic sessions consistently without increase in pain and with improving function to demonstrate good toleration and effectiveness of intervention.  Baseline: Goal status: INITIAL  2.  Pt will tolerate walking to or from setting and engaging in aquatic therapy session without excessive fatigue or increase in pain to demonstrate improved toleration to activity. Baseline:  Goal status: INITIAL  3.  Pt will gain access to a 4 or 2 WW  to improve safety with amb Baseline:  Goal status: INITIAL   LONG TERM GOALS: Target date: 12/11/23  Pt will amb with approp AD indep community distances without reports of quad fatigue  Baseline:short distances holding to wife shoulder  Goal status: INITIAL  2.  Pt will  improve strength in all areas listed by 10lbs to demonstrate improved overall physical function Baseline:  Goal status: INITIAL  3.  Pt will report decrease in pain by at least 50% for improved toleration to activity/quality of life and to demonstrate improved management of pain. Baseline:  Goal status: INITIAL  4.  Pt will report no falls Baseline:  Goal status: INITIAL  PLAN:  PT FREQUENCY: 1-2x/week  PT DURATION: 8 weeks  PLANNED INTERVENTIONS: 97164- PT Re-evaluation, 97110-Therapeutic exercises, 97530- Therapeutic activity, 97112- Neuromuscular re-education, 97535- Self Care, 02859- Manual therapy, [id number]- Gait training, [id number]- Orthotic/Prosthetic subsequent, [id number]- Electrical stimulation (unattended), [id number]- Ionotophoresis 4mg /ml Dexamethasone, Patient/Family education, Balance training, Stair training, Taping, Joint mobilization, Joint manipulation, DME instructions, Cryotherapy, and Moist heat.  PLAN FOR NEXT SESSION: Land and aquatics: le and core strengthening, gait training, balance retraining; safety, use of ad, transfers, bed mobility, stair/curb climbing   [place]) Blayne Garlick MPT 10/14/23 10:48 AM [facility] [address] [place], [place], [address] Phone: [phone]   Fax:  [phone]  Date of referral: 09/30/23 Referring provider: Alvan Dorothyann BIRCH, MD  Referring diagnosis? M54.50 (ICD-10-CM) - Acute midline low back pain, unspecified whether sciatica present  Treatment diagnosis? (if different than referring diagnosis) no  What was this (referring dx) caused by? Ongoing Issue and Arthritis  Lysle of Condition: Chronic (continuous duration > 3 months)   Laterality: Both  Current Functional Measure Score: Other Pt unable to complete due to decreased cognition  Objective measurements identify impairments when they are compared to normal values, the uninvolved extremity, and prior level of  function.  [x]  Yes  []  No  Objective assessment of functional ability: Severe functional limitations   Briefly describe symptoms: Loss of LE muscular strength, inability to amb or stand without assistance  How did symptoms start: getting OOB unable to walk  Average pain intensity:  Last 24 hours: 0  Past week: 0  How often does the pt experience symptoms? Frequently  How much have the symptoms interfered with usual daily activities? Extremely  How has condition changed since care began at this facility? NA - initial visit  In general, how is the patients overall health? Fair   BACK PAIN (STarT  Back Screening Tool) Has pain spread down the leg(s) at some time in the last 2 weeks? no Has there been pain in the shoulder or neck at some time in the last 2 weeks? no Has the pt only walked short distances because of back pain? yes Has patient dressed more slowly because of back pain in the past 2 weeks? yes Does patient think it's not safe for a person with this condition to be physically active? yes Does patient have worrying thoughts a lot of the time? no Does patient feel back pain is terrible and will never get any better? no Has patient stopped enjoying things they usually enjoy? no

## 2023-10-15 NOTE — Progress Notes (Unsigned)
 Cardiology Office Note    Date:  10/16/2023  ID:  Jassen, Sarver 03/09/1946, MRN [id number] PCP:  [name], MD  Cardiologist:  Randene Bustard, MD  Electrophysiologist:  None   Chief Complaint: Dyspnea on exertion and leg weakness  History of Present Illness: Aaron Aas    Kyjuan Gause is a 78 y.o. male with visit-pertinent history of CAD s/p CABGx3 in 01/2023, Patient with inferior ST elevation MI s/p PCI/DES to the RCA in 12/2022, left subclavian artery stenosis with associated subclavian steal status post stenting in 12/2021, remote CVA/TIA, DM 2, hypertension, hyperlipidemia, prior tobacco use with likely emphysema, senile purpura, hoarseness and parotid mass.  Routine ETT from 11/2017 was nondiagnostic due to submaximal heart rate.  Subsequent Lexiscan  MPI in 11/2017 was low risk with an EF of 73%.  Echo from 2021 showed EF 60 to 65%, no RWMA, G1 DD, normal RV systolic function and ventricular cavity size, mildly dilated left atrium, mild mitral digitation, mild aortic valve sclerosis that evidence of stenosis, mild dilation of the acing aorta measuring 39 mm and an estimated right atrial pressure of 3 mmHg.    In 11/2021 he was evaluated for precordial pain, exertional dyspnea and left arm numbness.  MRI of the brain in 11/2021 showed no acute intracranial abnormality with multiple small vessel infarcts and findings of chronic ischemic microangiopathic.  He underwent echo in 11/2021 which showed an EF of 55 to 60%, no RWMA, moderate asymmetric LVH of the basal septal segment, G1 DD, normal RV systolic function and ventricular cavity size, aortic valve sclerosis without evidence of stenosis and estimated right atrial pressure of 3 mmHg.  Zio patch at that time showed a predominant rhythm of sinus with an average rate of 64 bpm, ranging from 47 to 98 bpm in sinus, 3 runs of NSVT lasting up to 12 beats, 1 run of atrial tachycardia lasting 5 beats, occasional PACs with a 2.1% burden and  rare PVCs.  Coronary CTA in 11/2021 showed a calcium  score of 562 which was 67th percentile.  There was 50 to 69% stenosis in the ostial LAD, greater than 70% stenosis in the mid LAD, less than 25% stenosis in a nondominant LCx and greater than 70% stenosis in the proximal RCA.  Cardiac catheterization was recommended.  CTA head and neck in 11/2021 showed severe stenosis of the proximal left subclavian artery, multifocal severe stenosis of left V2 vertebral artery and less than 50% stenosis of the bilateral bifurcation ICA atherosclerosis.  Partially imaged left parotid tail mass had increased in size relative to CT from neck in 2018.  Prior to cardiac cath he was evaluated by vascular surgery for left subclavian artery stenosis and underwent successful stenting in 12/2021.  LHC in 12/2022 showed significant multivessel CAD.  In follow-up it was recommended he be evaluated by CV TS for CABG as best revascularization option.  Referral was made however patient canceled.  Patient was admitted to [facility] from 8/1 through 8//24 with an inferior ST elevation MI.  Emergent LHC showed significant three-vessel CAD including 75% distal left main stenosis, 70% proximal LAD stenosis, 75% stenosis in the mid LAD, 95% stenosis in OM1 and 100% stenosis in the proximal RCA which was felt to be the culprit vessel.  He underwent successful PCI/DES to the proximal RCA.  Echo during admission showed an EF of 50 to 55% with mid and distal inferior wall hypokinesis, grade 1 diastolic dysfunction, normal RV systolic function and ventricular cavity size,  mild mitral regurgitation and estimated right atrial pressure of 3 mmHg.  On 12/22/2022 he presented to the ED with continued shortness of breath.  High sensitive troponin was downtrending from prior peak of greater than 24,000.  Chest x-ray showed emphysema with no acute cardiopulmonary process.  EKG showed sinus rhythm with evolving changes consistent with prior inferior MI.  He was  discharged with outpatient follow-up.  Patient was last in clinic on 01/19/2023 by Varney Gentleman, PA.  Patient denied any anginal symptoms.  He continued to note longstanding dyspnea that appeared to be more pronounced following his MI.  Patient had previously been seen by ENT with laryngoscopy being unrevealing, it was recommended he establish with pulmonology.  In 01/2023 patient was admitted to [facility] with unstable angina, he was transferred to [facility] for consideration of bypass surgery.  On 01/30/2023 patient underwent CABG x 3 with LIMA to LAD, SVG to PDA and SVG to OM.  He underwent endoscopic harvest of greater saphenous vein from his right leg.  Following procedure patient had hallucinations and confusion.  He went into A-fib with RVR and was started on amiodarone  drip.  Patient was continued on Plavix  given stent placement the prior month.  He was discharged to SNF on 02/09/2023.  He was seen in clinic by Dr. Sherene Dilling on 04/15/2019 for, he reportedly remained stable from a cardiac standpoint, his amiodarone  was discontinued.  Patient did not have follow-up with cardiology postprocedure.  Today he presents for follow-up.  He reports his primary complaint today is weakness in his legs with ongoing walking that has been present for three years, patient notes that this improved for a few months post CABG however in the last 2-3 months has again worsened. He also reports worsening shortness of breath with exertion that improves with rest, patient reports that prior to his CABG he was having similar shortness of breath, he notes that this improved following his bypass and has progressively worsened in the last 2 to 3 months. He denies any chest pain with exertion, endorses an occasional "pinpoint" chest pain that can occur at any time at, to the left of his mid sternal incision, felt to be related to nerve pain.  Patient notes with his previous interventions he did not have significant chest discomfort.  He denies lower  extremity edema, palpitations, orthopnea, PND, presyncope or syncope.  ROS: .   Today he denies chest pain, shortness of breath, lower extremity edema, fatigue, palpitations, melena, hematuria, hemoptysis, diaphoresis, weakness, presyncope, syncope, orthopnea, and PND.  All other systems are reviewed and otherwise negative. Studies Reviewed: Aaron Aas    EKG:  EKG is ordered today, personally reviewed, demonstrating  EKG Interpretation Date/Time:  Friday October 16 2023 09:12:05 EDT Ventricular Rate:  66 PR Interval:  180 QRS Duration:  82 QT Interval:  402 QTC Calculation: 421 R Axis:   -76  Text Interpretation: Normal sinus rhythm Left axis deviation Inferior infarct (cited on or before 30-Jan-2023) Anterolateral infarct , age undetermined T wave inversion in lateral leads Confirmed by Ayianna Darnold [phone]) on 10/16/2023 5:48:59 PM   CV Studies: Cardiac studies reviewed are outlined and summarized above. Otherwise please see EMR for full report. Cardiac Studies & Procedures   ______________________________________________________________________________________________ CARDIAC CATHETERIZATION  CARDIAC CATHETERIZATION 12/11/2022  Conclusion   1st Mrg lesion is 75% stenosed.   Prox LAD lesion is 70% stenosed.   Mid LAD lesion is 75% stenosed.   Mid LM to Dist LM lesion is 75% stenosed.   Prox RCA  lesion is 100% stenosed.   A drug-eluting stent was successfully placed using a STENT ONYX FRONTIER 2.5X22.   Post intervention, there is a 0% residual stenosis.   There is mild left ventricular systolic dysfunction.   LV end diastolic pressure is mildly elevated.   The left ventricular ejection fraction is 45-50% by visual estimate.  1.  Inferior STEMI 2.  Three-vessel coronary artery disease with 75% stenosis distal left main, 70% stenosis proximal LAD, 75% stenosis mid LAD, 95% stenosis OM1, 100% stenosis proximal RCA (culprit vessel) 3.  Mildly reduced left ventricular function with estimated LV  ejection fraction 45 to 50% 4.  Successful primary PCI with 2.5 x 22 mm Onyx frontier drug-eluting stent proximal RCA  Recommendations  1.  Dual antiplatelet therapy uninterrupted x 1 year 2.  Start metoprolol  succinate 25 mg daily 3.  2D echocardiogram 4.  Start ACE/ARB prior to discharge 5.  Cardiac rehabilitation after discharge 6.  Close cardiology follow-up after discharge  Findings Coronary Findings Diagnostic  Dominance: Right  Left Main Mid LM to Dist LM lesion is 75% stenosed. The lesion is tubular.  Left Anterior Descending Prox LAD lesion is 70% stenosed. The lesion is tubular. Mid LAD lesion is 75% stenosed. The lesion is tubular.  Left Circumflex  First Obtuse Marginal Branch 1st Mrg lesion is 75% stenosed. The lesion is tubular.  Right Coronary Artery Prox RCA lesion is 100% stenosed.  Intervention  Prox RCA lesion Stent Lesion crossed with guidewire using a WIRE ASAHI PROWATER 180CM. Pre-stent angioplasty was performed using a BALLN TREK RX [id number]. A drug-eluting stent was successfully placed using a STENT ONYX FRONTIER 2.5X22. Stent strut is well apposed. Stent does not overlap previously placed stentPost-stent angioplasty was performed using a BALLN Duffield EUPHORA RX 2.75X20. Post-Intervention Lesion Assessment The intervention was successful. Pre-interventional TIMI flow is 0. Post-intervention TIMI flow is 3. No complications occurred at this lesion. There is a 0% residual stenosis post intervention.   CARDIAC CATHETERIZATION  CARDIAC CATHETERIZATION 01/02/2022  Conclusion   Dist LM to Ost LAD lesion is 50% stenosed.   Mid LAD lesion is 85% stenosed with 90% stenosed side branch in 1st Diag.   Mid LAD to Dist LAD lesion is 90% stenosed with 65% stenosed side branch in 2nd Diag.   Prox Cx to Mid Cx lesion is 30% stenosed.   Ramus lesion is 75% stenosed.   1st Mrg lesion is 60% stenosed.   Prox RCA lesion is 25% stenosed.   Prox RCA to Mid RCA lesion  is 80% stenosed.   The left ventricular systolic function is normal by echocardiogram   LV end diastolic pressure is normal.   There is no aortic valve stenosis.  POST-OPERATIVE DIAGNOSIS: Multivessel CAD: Distal LM-ostial LAD eccentric 50% ([place] 1, 0, 1+. Proximal-mid LAD (eccentric) 80% involving D1 80% ([place] 0, 1, 1) -> distal mid LAD 90% involving 70% D2 ([place] 1, 1, 1) 80% proximal-mid RI 30% proximal LCx with 6% OM1 10% proximal RCA, calcified eccentric 80% mid RCA LVEDP 16 mmHg. No AS   PLAN OF CARE: Discharge to home after PACU -> multivessel CAD warrants CABG evaluation as the Best Revasculariztion option.  Not favorable for PCI. The patient wanted time to think about it and discuss with his family to determine whether or not he would want to go through with CABG or PCI.  Right now it sounds like he is more interested in medical management. For now we will continue aspirin  and Plavix  until  decision is made about whether or not he wants to go for CABG. Would likely need to titrate up his antilipid management did titrate for LDL<55 We will reevaluate blood pressures and symptoms and follow-up visit in roughly 2 weeks.  He has been on ACE inhibitor but is on the list but not taking, will need to determine reasons for not being on medications and then likely consider ARB and beta-blocker.   Randene Bustard, MD  Findings Coronary Findings Diagnostic  Dominance: Right  Left Main Dist LM to Ost LAD lesion is 50% stenosed. The lesion is located at the bifurcation, focal and eccentric. Mildly calcified, hazy  Left Anterior Descending Mid LAD lesion is 85% stenosed with 90% stenosed side branch in 1st Diag. The lesion is located at the bifurcation, focal and eccentric. Mid LAD to Dist LAD lesion is 90% stenosed with 65% stenosed side branch in 2nd Diag. The lesion is located at the bifurcation and segmental.  First Diagonal Branch Vessel is small in size.  First Septal  Branch Vessel is small in size.  Second Diagonal Branch Vessel is small in size.  Second Septal Branch Vessel is small in size.  Ramus Intermedius Vessel is large. Ramus lesion is 75% stenosed.  Left Circumflex Vessel is moderate in size. Prox Cx to Mid Cx lesion is 30% stenosed.  First Obtuse Marginal Branch Vessel is small in size. 1st Mrg lesion is 60% stenosed.  Right Coronary Artery Prox RCA lesion is 25% stenosed. Prox RCA to Mid RCA lesion is 80% stenosed. The lesion is located proximal to the major branch and irregular. The lesion is moderately calcified.  Right Ventricular Branch Vessel is small in size.  Right Posterior Atrioventricular Artery Vessel is large in size.  First Right Posterolateral Branch Vessel is small in size.  Second Right Posterolateral Branch Vessel is large in size.  Intervention  No interventions have been documented.   STRESS TESTS  MYOCARDIAL PERFUSION IMAGING 12/08/2017  Narrative  Nuclear stress EF: 73%.  No T wave inversion was noted during stress.  There was no ST segment deviation noted during stress.  This is a low risk study.  No reversible ischemia. LVEF 73% with normal wall motion. This is a low risk study.   ECHOCARDIOGRAM  ECHOCARDIOGRAM COMPLETE 12/11/2022  Narrative ECHOCARDIOGRAM REPORT    Patient Name:   TAYSEN BUSHART Date of Exam: 12/11/2022 Medical Rec #:  [id number]           Height:       74.0 in Accession #:    [id number]          Weight:       241.6 lb Date of Birth:  1945-09-05            BSA:          2.355 m Patient Age:    77 years            BP:           92/69 mmHg Patient Gender: M                   HR:           60 bpm. Exam Location:  [facility]  Procedure: 2D Echo, Cardiac Doppler, Color Doppler and Strain Analysis  Indications:     Acute MI  History:         Patient has prior history of Echocardiogram examinations, most recent 11/28/2021. Acute MI and CAD, TIA and  Stroke, Signs/Symptoms:Shortness of Breath; Risk Factors:Hypertension, Diabetes, Dyslipidemia and Current Smoker. CKD.  Sonographer:     Clarke Crouch Referring Phys:  [id number] LILY MICHELLE TANG Diagnosing Phys: Percival Brace MD   Sonographer Comments: Global longitudinal strain was attempted. IMPRESSIONS   1. Left ventricular ejection fraction, by estimation, is 50 to 55%. The left ventricle has low normal function. The left ventricle demonstrates regional wall motion abnormalities (see scoring diagram/findings for description). Left ventricular diastolic parameters are consistent with Grade I diastolic dysfunction (impaired relaxation). 2. Right ventricular systolic function is normal. The right ventricular size is normal. 3. The mitral valve is normal in structure. Mild mitral valve regurgitation. No evidence of mitral stenosis. 4. The aortic valve is normal in structure. Aortic valve regurgitation is not visualized. No aortic stenosis is present. 5. The inferior vena cava is normal in size with greater than 50% respiratory variability, suggesting right atrial pressure of 3 mmHg.  FINDINGS Left Ventricle: Left ventricular ejection fraction, by estimation, is 50 to 55%. The left ventricle has low normal function. The left ventricle demonstrates regional wall motion abnormalities. The left ventricular internal cavity size was normal in size. There is no left ventricular hypertrophy. Left ventricular diastolic parameters are consistent with Grade I diastolic dysfunction (impaired relaxation).   LV Wall Scoring: The mid and distal inferior wall is hypokinetic.  Right Ventricle: The right ventricular size is normal. No increase in right ventricular wall thickness. Right ventricular systolic function is normal.  Left Atrium: Left atrial size was normal in size.  Right Atrium: Right atrial size was normal in size.  Pericardium: There is no evidence of pericardial  effusion.  Mitral Valve: The mitral valve is normal in structure. Mild mitral valve regurgitation. No evidence of mitral valve stenosis. MV peak gradient, 5.3 mmHg. The mean mitral valve gradient is 1.0 mmHg.  Tricuspid Valve: The tricuspid valve is normal in structure. Tricuspid valve regurgitation is mild . No evidence of tricuspid stenosis.  Aortic Valve: The aortic valve is normal in structure. Aortic valve regurgitation is not visualized. No aortic stenosis is present. Aortic valve mean gradient measures 2.0 mmHg. Aortic valve peak gradient measures 4.1 mmHg. Aortic valve area, by VTI measures 2.96 cm.  Pulmonic Valve: The pulmonic valve was normal in structure. Pulmonic valve regurgitation is not visualized. No evidence of pulmonic stenosis.  Aorta: The aortic root is normal in size and structure.  Venous: The inferior vena cava is normal in size with greater than 50% respiratory variability, suggesting right atrial pressure of 3 mmHg.  IAS/Shunts: No atrial level shunt detected by color flow Doppler.   LEFT VENTRICLE PLAX 2D LVIDd:         5.00 cm LVIDs:         3.40 cm LV PW:         1.40 cm LV IVS:        1.40 cm LVOT diam:     1.90 cm LV SV:         69 LV SV Index:   29 LVOT Area:     2.84 cm  LV Volumes (MOD) LV vol d, MOD A2C: 42.2 ml LV vol d, MOD A4C: 67.2 ml LV vol s, MOD A2C: 22.7 ml LV vol s, MOD A4C: 29.7 ml LV SV MOD A2C:     19.5 ml LV SV MOD A4C:     67.2 ml LV SV MOD BP:      28.1 ml  RIGHT VENTRICLE RV Basal diam:  3.60  cm RV Mid diam:    3.20 cm RV S prime:     13.60 cm/s TAPSE (M-mode): 2.9 cm  LEFT ATRIUM             Index        RIGHT ATRIUM           Index LA diam:        3.80 cm 1.61 cm/m   RA Area:     14.90 cm LA Vol (A2C):   52.3 ml 22.21 ml/m  RA Volume:   33.30 ml  14.14 ml/m LA Vol (A4C):   48.3 ml 20.51 ml/m LA Biplane Vol: 50.6 ml 21.48 ml/m AORTIC VALVE                    PULMONIC VALVE AV Area (Vmax):    3.03 cm     PV  Vmax:       0.86 m/s AV Area (Vmean):   2.72 cm     PV Peak grad:  3.0 mmHg AV Area (VTI):     2.96 cm AV Vmax:           101.00 cm/s AV Vmean:          68.900 cm/s AV VTI:            0.233 m AV Peak Grad:      4.1 mmHg AV Mean Grad:      2.0 mmHg LVOT Vmax:         108.00 cm/s LVOT Vmean:        66.000 cm/s LVOT VTI:          0.243 m LVOT/AV VTI ratio: 1.04  AORTA Ao Root diam: 3.80 cm Ao Asc diam:  3.70 cm  MITRAL VALVE MV Area (PHT): 3.17 cm    SHUNTS MV Area VTI:   1.97 cm    Systemic VTI:  0.24 m MV Peak grad:  5.3 mmHg    Systemic Diam: 1.90 cm MV Mean grad:  1.0 mmHg MV Vmax:       1.15 m/s MV Vmean:      51.2 cm/s MV Decel Time: 239 msec MV E velocity: 78.10 cm/s MV A velocity: 83.80 cm/s MV E/A ratio:  0.93  Percival Brace MD Electronically signed by Percival Brace MD Signature Date/Time: 12/11/2022/4:22:14 PM    Final   TEE  ECHO INTRAOPERATIVE TEE 01/30/2023  Narrative *INTRAOPERATIVE TRANSESOPHAGEAL REPORT *    Patient Name:   RYKAR LEBLEU Date of Exam: 01/30/2023 Medical Rec #:  [id number]           Height:       73.0 in Accession #:    [id number]          Weight:       199.7 lb Date of Birth:  Dec 24, 1945            BSA:          2.15 m Patient Age:    77 years            BP: Patient Gender: M                   HR: Exam Location:  Anesthesiology  Transesophogeal exam was perform intraoperatively during surgical procedure. Patient was closely monitored under general anesthesia during the entirety of examination.  Performing Phys: Leslye Rast MD Diagnosing Phys: Babetta Bologna  Complications: No known complications during this procedure. POST-OP IMPRESSIONS _ Left Ventricle:  The left ventricle is unchanged from pre-bypass. has normal systolic function. _ Right Ventricle: The right ventricle appears unchanged from pre-bypass, has normal systolic function. _ Aorta: The aorta appears unchanged from pre-bypass; there is no  dissection present in the aorta. _ Left Atrial Appendage: The left atrial appendage appears unchanged from pre-bypass. _ Aortic Valve: The aortic valve appears unchanged from pre-bypass. _ Mitral Valve: The mitral valve appears unchanged from pre-bypass. There is mild regurgitation. _ Tricuspid Valve: The tricuspid valve appears unchanged from pre-bypass. _ Pulmonic Valve: The pulmonic valve appears unchanged from pre-bypass. _ Pericardium: The pericardium appears unchanged from pre-bypass.  PRE-OP FINDINGS Left Ventricle: The left ventricle has low normal systolic function, with an ejection fraction of 50-55%. The cavity size was normal. There is no left ventricular hypertrophy.   Right Ventricle: The right ventricle has normal systolic function. The cavity was normal. There is no increase in right ventricular wall thickness. Catheter present in the right ventricle.  Left Atrium: Left atrial size was not assessed. No left atrial/left atrial appendage thrombus was detected.  Right Atrium: Right atrial size was not assessed.  Interatrial Septum: No atrial level shunt detected by color flow Doppler. There is no evidence of a patent foramen ovale.  Pericardium: There is no evidence of pericardial effusion. There is no pleural effusion.  Mitral Valve: Leaflets appear thickened. Mitral valve regurgitation is mild by color flow Doppler. There is No evidence of mitral stenosis.  Tricuspid Valve: The tricuspid valve was normal in structure. Tricuspid valve regurgitation is trivial by color flow Doppler. No evidence of tricuspid stenosis is present.  Aortic Valve: The aortic valve is tricuspid. Aortic valve regurgitation was not visualized by color flow Doppler. There is mild stenosis of the aortic valve, with a calculated valve area of 1.97 cm. There is moderate calcification present.  Pulmonic Valve: The pulmonic valve was normal in structure No evidence of pumonic stenosis. Pulmonic valve  regurgitation is trivial by color flow Doppler.   Aorta: The aortic root, ascending aorta and aortic arch are normal in size and structure.  Shunts: There is no evidence of an atrial septal defect.  +--------------+--------++ LEFT VENTRICLE         +--------------+--------++ PLAX 2D                 +------------+---------++ +--------------+--------++  3D Volume EF          LVOT diam:    2.40 cm   +------------+---------++ +--------------+--------++  LV 3D EDV:  111.35 ml LVOT Area:    4.52 cm  +------------+---------++ +--------------+--------++  LV 3D ESV:  52.45 ml                          +------------+---------++ +--------------+--------++  +---------------+------++ RIGHT VENTRICLE       +---------------+------++ RV FAC:        53.1 % +---------------+------++  +------------------+-----------++ AORTIC VALVE                  +------------------+-----------++ AV Area (Vmax):   2.48 cm    +------------------+-----------++ AV Area (Vmean):  2.26 cm    +------------------+-----------++ AV Area (VTI):    1.97 cm    +------------------+-----------++ AV Vmax:          155.03 cm/s +------------------+-----------++ AV Vmean:         88.867 cm/s +------------------+-----------++ AV VTI:           0.345 m     +------------------+-----------++ AV  Peak Grad:     9.6 mmHg    +------------------+-----------++ AV Mean Grad:     4.7 mmHg    +------------------+-----------++ LVOT Vmax:        85.10 cm/s  +------------------+-----------++ LVOT Vmean:       44.300 cm/s +------------------+-----------++ LVOT VTI:         0.150 m     +------------------+-----------++ LVOT/AV VTI ratio:0.44        +------------------+-----------++  +--------------+----------++ MITRAL VALVE              +--------------+-------+ +--------------+----------++  SHUNTS                MV Area  (PHT):3.93 cm    +--------------+-------+ +--------------+----------++  Systemic VTI: 0.15 m  MV PHT:       55.97 msec  +--------------+-------+ +--------------+----------++  Systemic Diam:2.40 cm MV Decel Time:193 msec    +--------------+-------+ +--------------+----------++ +--------------+----------++ MV E velocity:72.80 cm/s +--------------+----------++ MV A velocity:70.10 cm/s +--------------+----------++ MV E/A ratio: 1.04       +--------------+----------++   Babetta Bologna Electronically signed by Babetta Bologna Signature Date/Time: 01/30/2023/2:19:23 PM    Final  MONITORS  LONG TERM MONITOR (3-14 DAYS) 12/16/2021  Narrative   Zio Patch Wear Time:  12 days and 14 hours (2023-07-13T19:56:30-0400 to 2023-07-26T10:46:39-0400)   Predominant Underlying Rhythm is Sinus Rhythm: HR range 47-98 bpm, AVG 64 bpm.   3 runs of Non-sustained Ventricular Tachycardia (> 4 PVC beats): Fastest: 5 beats - HR range 125-162 bpm, Avg 145 bpm, 2.1 sec; Longest with Fastest Average-12 beats, 150-158 bpm, avg 155 bpm, 4.6 sec-   1 Atrial Run: 5 beats - max HR 125, Avg 120 bpm, * Sec   No Sustained Arrhythmias: Sustained Ventricular Tachycardia (VT), Supraventricular Tachycardia (SVT), Atrial Tachycardia (AT), Atrial Fibrillation (A-Fib), Atrial Flutter (A-Flutter)   Occasional isolated PACs (2.1%) with rare couplets and triplets.   Rare isolated PVCs (<1%) with some couplets, bigeminy and trigeminy.   No patient triggered events.  Overall pretty reassuring study.  Short Bursts of Nonsustained Ventricular Tachycardia none unsuspected with multivessel coronary artery disease.  This will be treated with the same medical treatment to be used for treating coronary disease.   Randene Bustard, MD   CT SCANS  CT CORONARY MORPH W/CTA COR W/SCORE 11/18/2021  Addendum 11/18/2021  3:04 PM ADDENDUM REPORT: 11/18/2021 15:02  ADDENDUM: The following report is an over-read performed by  radiologist Dr. Tama Fails of [facility] on November 18, 2021. This over-read does not include interpretation of cardiac or coronary anatomy or pathology. The coronary CTA interpretation by the cardiologist is attached.  COMPARISON:  CT Sep 21, 2017.  FINDINGS: Vascular: No acute non-cardiac vascular finding.  Mediastinum/Nodes: No pathologically enlarged mediastinal, or hilar lymph nodes in the visualized portions of the thorax. Distal esophagus is grossly unremarkable.  Lungs/Pleura: Within the visualized portions of the thorax there are no suspicious appearing pulmonary nodules or masses, there is no acute consolidative airspace disease, no pleural effusions and no pneumothorax  Upper Abdomen: Visualized portions of the upper abdomen are unremarkable.  Musculoskeletal: There are no aggressive appearing lytic or blastic lesions noted in the visualized portions of the skeleton.  IMPRESSION: No significant incidental noncardiac finding noted.   Electronically Signed By: Tama Fails M.D. On: 11/18/2021 15:02  Narrative CLINICAL DATA:  Hx of PAD, TIA  EXAM: Cardiac/Coronary  CTA  TECHNIQUE: The patient was scanned on a [employer] scanner.  : A prospective scan was triggered in the descending  thoracic aorta. Axial non-contrast 3 mm slices were carried out through the heart. The data set was analyzed on a dedicated work station and scored using the Agatson method. Gantry rotation speed was 66 msecs and collimation was .6 mm. 100mg  of metoprolol , 15mg  ivabradine  and 0.8 mg of sl NTG was given. The 3D data set was reconstructed in 5% intervals of the 60-95 % of the R-R cycle. Diastolic phases were analyzed on a dedicated work station using MPR, MIP and VRT modes. The patient received 75 cc of contrast.  FINDINGS: Aorta:  Normal size.  No calcifications.  No dissection.  Aortic Valve:  Trileaflet.  No calcifications.  Coronary Arteries:   Normal coronary origin.  Right dominance.  RCA is a dominant artery that gives rise to PDA and PLA. There is calcified and non calcified plaque causing severe proximal RCA stenosis (>70%).  Left main gives rise to LAD and LCX arteries.  LAD has calcified plaque in the ostium causing moderate stenosis (50-69%). There is non calcified plaque in the mid LAD causing severe stenosis (>70%).  LCX is a non-dominant artery that gives rise to two obtuse marginal branches. There is calcified plaque in the proximal LCx causing minimal stenosis (<25%).  Other findings:  Normal pulmonary vein drainage into the left atrium.  Normal left atrial appendage without a thrombus.  Normal size of the pulmonary artery.  IMPRESSION: 1. Coronary calcium  score of 562. This was 67th percentile for age and sex matched control.  2. Normal coronary origin with right dominance.  3. Non calcified plaque causing severe stenosis (>70%) in the proximal RCA and mid LAD.  4. CAD-RADS 4 Severe stenosis. (70-99% or > 50% left main). Cardiac catheterization is recommended.  5. Consider symptom-guided anti-ischemic pharmacotherapy as well as risk factor modification per guideline directed care.  Electronically Signed: By: Constancia Delton M.D. On: 11/18/2021 13:17     ______________________________________________________________________________________________       Current Reported Medications:.    Current Meds  Medication Sig   Accu-Chek Softclix Lancets lancets Use to check blood sugars twice daily   aspirin  EC 81 MG tablet Take 1 tablet (81 mg total) by mouth daily. Swallow whole.   atorvastatin  (LIPITOR ) 80 MG tablet TAKE 1 TABLET BY MOUTH AT  BEDTIME   Blood Glucose Monitoring Suppl (ACCU-CHEK GUIDE ME) w/Device KIT Check glucose three times daily. Dx. Code E11.8   clopidogrel  (PLAVIX ) 75 MG tablet Take 1 tablet (75 mg total) by mouth daily.   glucose blood (ACCU-CHEK GUIDE TEST) test strip  Check glucose three times daily. Dx. Code E11.8   glucose blood (ACCU-CHEK GUIDE) test strip Dx DM E11.9. Check fasting blood sugar every morning.   insulin  degludec (TRESIBA  FLEXTOUCH) 100 UNIT/ML FlexTouch Pen Inject 20 Units into the skin daily. (Patient taking differently: Inject 20 Units into the skin daily as needed (high blood sugar per patient).)   metoprolol  succinate (TOPROL -XL) 25 MG 24 hr tablet Take 1 tablet (25 mg total) by mouth daily.   Semaglutide , 1 MG/DOSE, 4 MG/3ML SOPN Inject 1 mg into the skin once a week.   SYNJARDY  12.5-500 MG TABS TAKE 1 TABLET BY MOUTH TWICE  DAILY   Physical Exam:    VS:  BP 128/78   Pulse 66   Ht 6\' 1"  (1.854 m)   Wt 221 lb 3.2 oz (100.3 kg)   SpO2 97%   BMI 29.18 kg/m    Wt Readings from Last 3 Encounters:  10/16/23 221 lb 3.2 oz (100.3  kg)  09/24/23 216 lb 0.6 oz (98 kg)  08/17/23 212 lb 4 oz (96.3 kg)    GEN: Well nourished, well developed in no acute distress NECK: No JVD; No carotid bruits CARDIAC: RRR, no murmurs, rubs, gallops RESPIRATORY:  Clear to auscultation without rales, wheezing or rhonchi  ABDOMEN: Soft, non-tender, non-distended EXTREMITIES:  No edema; No acute deformity     Asessement and Plan:.    CAD/dyspnea on exertion: S/p PCI/DES to the RCA in setting of inferior STEMI in 12/2022.  Previously recommended to be evaluated by CV TS for CABG multiple times however patient canceled appointment.  Patient was admitted in 01/2019 for with unstable angina, underwent CABG x 3 with LIMA to LAD, SVG to PDA and SVG to OM with endoscopic harvest of greater saphenous vein from his right leg on 01/30/2023. Worsening shortness of breath in the last 2 months, notes that following his bypass surgery this had improved.  He denies any significant chest pain or chest pain with exertion, he notes a pinpoint chest pain at his midsternal incision related to nerve pain.  Discussed with Dr. Addie Holstein, he recommended proceeding with a cardiac PET stress  test for further evaluation.  Patient is in agreement, please see consent below.  Will also check echocardiogram.  Reviewed ED precautions.  Continue aspirin  80 g daily, 80 mg daily, Plavix  75 mg daily, metoprolol  succinate 25 mg daily. Check CBC, CMET.  Informed Consent   Shared Decision Making/Informed Consent The risks [chest pain, shortness of breath, cardiac arrhythmias, dizziness, blood pressure fluctuations, myocardial infarction, stroke/transient ischemic attack, nausea, vomiting, allergic reaction, radiation exposure, metallic taste sensation and life-threatening complications (estimated to be 1 in 10,000)], benefits (risk stratification, diagnosing coronary artery disease, treatment guidance) and alternatives of a cardiac PET stress test were discussed in detail with Mr. Sulkowski and he agrees to proceed.     HTN: Blood pressure today 128/78.  Continue current antihypertensive regimen.  HLD: Last lipid profile on 12/11/22 indicated total cholesterol 109, HDL 37, triglycerides 103, LDL 51. Check fasting lipid profile and LFTs. Continue atorvastatin  80 mg daily.   Leg weakness: Patient per reports progressive leg weakness with ongoing exertion, denies any significant pain.  ABIs previously ordered however not completed.  Reorder ABIs and lower extremity arterial duplex.  History of CVA/TIA: MRI of the brain in 11/2021 showed no acute intracranial abnormality with multiple small vessel infarcts and findings of chronic ischemic microangiopathic.  He underwent echo in 11/2021 which showed an EF of 55 to 60%, no RWMA, moderate asymmetric LVH of the basal septal segment, G1 DD, normal RV systolic function and ventricular cavity size, aortic valve sclerosis without evidence of stenosis and estimated right atrial pressure of 3 mmHg.  Outpatient cardiac monitoring without evidence of atrial fibs/flutter.  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  Antiplatelet therapy as outlined  above.  CKD stage 3b: Last creatinine 1.74. Check CMET.     Disposition: F/u with Syenna Nazir, NP in two months.   Signed, Tarynn Garling D Taray Normoyle, NP

## 2023-10-16 ENCOUNTER — Ambulatory Visit: Attending: Cardiology | Admitting: Cardiology

## 2023-10-16 ENCOUNTER — Encounter: Payer: Self-pay | Admitting: Cardiology

## 2023-10-16 VITALS — BP 128/78 | HR 66 | Ht 73.0 in | Wt 221.2 lb

## 2023-10-16 DIAGNOSIS — Z951 Presence of aortocoronary bypass graft: Secondary | ICD-10-CM | POA: Diagnosis not present

## 2023-10-16 DIAGNOSIS — R29898 Other symptoms and signs involving the musculoskeletal system: Secondary | ICD-10-CM | POA: Diagnosis not present

## 2023-10-16 DIAGNOSIS — I25708 Atherosclerosis of coronary artery bypass graft(s), unspecified, with other forms of angina pectoris: Secondary | ICD-10-CM | POA: Diagnosis not present

## 2023-10-16 DIAGNOSIS — R0609 Other forms of dyspnea: Secondary | ICD-10-CM

## 2023-10-16 DIAGNOSIS — N183 Chronic kidney disease, stage 3 unspecified: Secondary | ICD-10-CM | POA: Diagnosis not present

## 2023-10-16 DIAGNOSIS — E1122 Type 2 diabetes mellitus with diabetic chronic kidney disease: Secondary | ICD-10-CM

## 2023-10-16 DIAGNOSIS — E782 Mixed hyperlipidemia: Secondary | ICD-10-CM

## 2023-10-16 DIAGNOSIS — I771 Stricture of artery: Secondary | ICD-10-CM

## 2023-10-16 DIAGNOSIS — I10 Essential (primary) hypertension: Secondary | ICD-10-CM

## 2023-10-16 DIAGNOSIS — I679 Cerebrovascular disease, unspecified: Secondary | ICD-10-CM | POA: Diagnosis not present

## 2023-10-16 NOTE — Patient Instructions (Signed)
 Medication Instructions:  No changes *If you need a refill on your cardiac medications before your next appointment, please call your pharmacy*  Lab Work: Today we are going to draw a CBC, and Cmet In 2 weeks we are going to need a fasting lipid panel If you have labs (blood work) drawn today and your tests are completely normal, you will receive your results only by: MyChart Message (if you have MyChart) OR A paper copy in the mail If you have any lab test that is abnormal or we need to change your treatment, we will call you to review the results.  Testing/Procedures: Your physician has requested that you have an echocardiogram. Echocardiography is a painless test that uses sound waves to create images of your heart. It provides your doctor with information about the size and shape of your heart and how well your heart's chambers and valves are working. This procedure takes approximately one hour. There are no restrictions for this procedure. Please do NOT wear cologne, perfume, aftershave, or lotions (deodorant is allowed). Please arrive 15 minutes prior to your appointment time.  Please note: We ask at that you not bring children with you during ultrasound (echo/ vascular) testing. Due to room size and safety concerns, children are not allowed in the ultrasound rooms during exams. Our front office staff cannot provide observation of children in our lobby area while testing is being conducted. An adult accompanying a patient to their appointment will only be allowed in the ultrasound room at the discretion of the ultrasound technician under special circumstances. We apologize for any inconvenience.  Your physician has requested that you have an ankle brachial index (ABI). During this test an ultrasound and blood pressure cuff are used to evaluate the arteries that supply the arms and legs with blood. Allow thirty minutes for this exam. There are no restrictions or special instructions. This  will take place at [address], 4th floor   Please note: We ask at that you not bring children with you during ultrasound (echo/ vascular) testing. Due to room size and safety concerns, children are not allowed in the ultrasound rooms during exams. Our front office staff cannot provide observation of children in our lobby area while testing is being conducted. An adult accompanying a patient to their appointment will only be allowed in the ultrasound room at the discretion of the ultrasound technician under special circumstances. We apologize for any inconvenience.  Your physician has requested that you have a lower extremity arterial duplex. This test is an ultrasound of the arteries in the legs. It looks at arterial blood flow in the legs and arms. Allow one hour for Lower Arterial scans. There are no restrictions or special instructions.    Please report to Radiology at the [facility] Main Entrance 30 minutes early for your test.  [address] [place], [place] [address]  How to Prepare for Your Cardiac PET/CT Stress Test:  Nothing to eat or drink, except water, 3 hours prior to arrival time.  NO caffeine/decaffeinated products, or chocolate 12 hours prior to arrival. (Please note decaffeinated beverages (teas/coffees) still contain caffeine).  If you have caffeine within 12 hours prior, the test will need to be rescheduled.  Medication instructions: Do not take erectile dysfunction medications for 72 hours prior to test (sildenafil , tadalafil) Do not take nitrates (isosorbide mononitrate, Ranexa) the day before or day of test Do not take tamsulosin the day before or morning of test Hold theophylline containing  medications for 12 hours. Hold Dipyridamole 48 hours prior to the test.  Diabetic Preparation: If able to eat breakfast prior to 3 hour fasting, you may take all medications, including your insulin . Do not worry if you miss your breakfast dose of insulin  - start  at your next meal. If you do not eat prior to 3 hour fast-Hold all diabetes (oral and insulin ) medications. Patients who wear a continuous glucose monitor MUST remove the device prior to scanning.  You may take your remaining medications with water.  NO perfume, cologne or lotion on chest or abdomen area. FEMALES - Please avoid wearing dresses to this appointment.  Total time is 1 to 2 hours; you may want to bring reading material for the waiting time.  IF YOU THINK YOU MAY BE PREGNANT, OR ARE NURSING PLEASE INFORM THE TECHNOLOGIST.  In preparation for your appointment, medication and supplies will be purchased.  Appointment availability is limited, so if you need to cancel or reschedule, please call the Radiology Department Scheduler at [phone] 24 hours in advance to avoid a cancellation fee of $100.00  What to Expect When you Arrive:  Once you arrive and check in for your appointment, you will be taken to a preparation room within the Radiology Department.  A technologist or Nurse will obtain your medical history, verify that you are correctly prepped for the exam, and explain the procedure.  Afterwards, an IV will be started in your arm and electrodes will be placed on your skin for EKG monitoring during the stress portion of the exam. Then you will be escorted to the PET/CT scanner.  There, staff will get you positioned on the scanner and obtain a blood pressure and EKG.  During the exam, you will continue to be connected to the EKG and blood pressure machines.  A small, safe amount of a radioactive tracer will be injected in your IV to obtain a series of pictures of your heart along with an injection of a stress agent.    After your Exam:  It is recommended that you eat a meal and drink a caffeinated beverage to counter act any effects of the stress agent.  Drink plenty of fluids for the remainder of the day and urinate frequently for the first couple of hours after the exam.  Your  doctor will inform you of your test results within 7-10 business days.  For more information and frequently asked questions, please visit our website: [email or web address]  For questions about your test or how to prepare for your test, please call: Cardiac Imaging Nurse Navigators Office: [phone] Please note: We ask at that you not bring children with you during ultrasound (echo/ vascular) testing. Due to room size and safety concerns, children are not allowed in the ultrasound rooms during exams. Our front office staff cannot provide observation of children in our lobby area while testing is being conducted. An adult accompanying a patient to their appointment will only be allowed in the ultrasound room at the discretion of the ultrasound technician under special circumstances. We apologize for any inconvenience.  Follow-Up: At [facility], you and your health needs are our priority.  As part of our continuing mission to provide you with exceptional heart care, our providers are all part of one team.  This team includes your primary Cardiologist (physician) and Advanced Practice Providers or APPs (Physician Assistants and Nurse Practitioners) who all work together to provide you with the care you need, when you need it.  Your  next appointment:   After Echo and Abi  Provider:   Any APP  We recommend signing up for the patient portal called "MyChart".  Sign up information is provided on this After Visit Summary.  MyChart is used to connect with patients for Virtual Visits (Telemedicine).  Patients are able to view lab/test results, encounter notes, upcoming appointments, etc.  Non-urgent messages can be sent to your provider as well.   To learn more about what you can do with MyChart, go to [email or web address].

## 2023-10-17 ENCOUNTER — Ambulatory Visit: Payer: Self-pay | Admitting: Cardiology

## 2023-10-17 LAB — COMPREHENSIVE METABOLIC PANEL WITH GFR
ALT: 16 IU/L (ref 0–44)
AST: 14 IU/L (ref 0–40)
Albumin: 4.2 g/dL (ref 3.8–4.8)
Alkaline Phosphatase: 77 IU/L (ref 44–121)
BUN/Creatinine Ratio: 12 (ref 10–24)
BUN: 19 mg/dL (ref 8–27)
Bilirubin Total: <0.2 mg/dL (ref 0.0–1.2)
CO2: 20 mmol/L (ref 20–29)
Calcium: 9.1 mg/dL (ref 8.6–10.2)
Chloride: 104 mmol/L (ref 96–106)
Creatinine, Ser: 1.53 mg/dL — ABNORMAL HIGH (ref 0.76–1.27)
Globulin, Total: 2.4 g/dL (ref 1.5–4.5)
Glucose: 108 mg/dL — ABNORMAL HIGH (ref 70–99)
Potassium: 5.2 mmol/L (ref 3.5–5.2)
Sodium: 140 mmol/L (ref 134–144)
Total Protein: 6.6 g/dL (ref 6.0–8.5)
eGFR: 47 mL/min/{1.73_m2} — ABNORMAL LOW (ref >59)

## 2023-10-17 LAB — CBC
Hematocrit: 40.5 % (ref 37.5–51.0)
Hemoglobin: 13.8 g/dL (ref 13.0–17.7)
MCH: 33.3 pg — ABNORMAL HIGH (ref 26.6–33.0)
MCHC: 34.1 g/dL (ref 31.5–35.7)
MCV: 98 fL — ABNORMAL HIGH (ref 79–97)
Platelets: 169 10*3/uL (ref 150–450)
RBC: 4.14 x10E6/uL (ref 4.14–5.80)
RDW: 11.8 % (ref 11.6–15.4)
WBC: 4.6 10*3/uL (ref 3.4–10.8)

## 2023-10-20 NOTE — Telephone Encounter (Signed)
 Left message to call back

## 2023-10-20 NOTE — Telephone Encounter (Signed)
-----   Message from Katlyn D [place] sent at 10/17/2023  5:41 PM EDT ----- Please let Mr. Helmstetter know that his CBC shows no evidence of anemia or infection. His kidney function appears close to his baseline. His liver function is normal. His potassium is high normal, recommend he stop any potassium supplements. His electrolytes are otherwise normal. Overall good results, continue with testing as planned.

## 2023-10-21 NOTE — Telephone Encounter (Signed)
-----   Message from Katlyn D [place] sent at 10/17/2023  5:41 PM EDT ----- Please let Jon [name] know that his CBC shows no evidence of anemia or infection. His kidney function appears close to his baseline. His liver function is normal. His potassium is high normal, recommend he stop any potassium supplements. His electrolytes are otherwise normal. Overall good results, continue with testing as planned.

## 2023-10-21 NOTE — Telephone Encounter (Signed)
 Left message to call back

## 2023-10-22 ENCOUNTER — Ambulatory Visit: Admitting: Family Medicine

## 2023-10-22 ENCOUNTER — Encounter: Payer: Self-pay | Admitting: Cardiology

## 2023-10-22 ENCOUNTER — Ambulatory Visit: Attending: Cardiology | Admitting: Cardiology

## 2023-10-22 VITALS — BP 90/60 | HR 67 | Ht 73.0 in | Wt 219.2 lb

## 2023-10-22 DIAGNOSIS — R0609 Other forms of dyspnea: Secondary | ICD-10-CM | POA: Diagnosis not present

## 2023-10-22 DIAGNOSIS — I25119 Atherosclerotic heart disease of native coronary artery with unspecified angina pectoris: Secondary | ICD-10-CM | POA: Diagnosis not present

## 2023-10-22 DIAGNOSIS — R42 Dizziness and giddiness: Secondary | ICD-10-CM

## 2023-10-22 DIAGNOSIS — I25708 Atherosclerosis of coronary artery bypass graft(s), unspecified, with other forms of angina pectoris: Secondary | ICD-10-CM

## 2023-10-22 DIAGNOSIS — E1169 Type 2 diabetes mellitus with other specified complication: Secondary | ICD-10-CM | POA: Diagnosis not present

## 2023-10-22 DIAGNOSIS — I2119 ST elevation (STEMI) myocardial infarction involving other coronary artery of inferior wall: Secondary | ICD-10-CM | POA: Diagnosis not present

## 2023-10-22 DIAGNOSIS — I10 Essential (primary) hypertension: Secondary | ICD-10-CM

## 2023-10-22 DIAGNOSIS — Z951 Presence of aortocoronary bypass graft: Secondary | ICD-10-CM

## 2023-10-22 DIAGNOSIS — R0602 Shortness of breath: Secondary | ICD-10-CM

## 2023-10-22 DIAGNOSIS — Z79899 Other long term (current) drug therapy: Secondary | ICD-10-CM | POA: Diagnosis not present

## 2023-10-22 DIAGNOSIS — E785 Hyperlipidemia, unspecified: Secondary | ICD-10-CM | POA: Diagnosis not present

## 2023-10-22 DIAGNOSIS — Z8673 Personal history of transient ischemic attack (TIA), and cerebral infarction without residual deficits: Secondary | ICD-10-CM

## 2023-10-22 DIAGNOSIS — R072 Precordial pain: Secondary | ICD-10-CM

## 2023-10-22 DIAGNOSIS — R55 Syncope and collapse: Secondary | ICD-10-CM | POA: Diagnosis not present

## 2023-10-22 DIAGNOSIS — I771 Stricture of artery: Secondary | ICD-10-CM | POA: Diagnosis not present

## 2023-10-22 MED ORDER — ATORVASTATIN CALCIUM 40 MG PO TABS: 40.0000 mg | ORAL_TABLET | Freq: Every day | ORAL | 3 refills | Status: AC

## 2023-10-22 NOTE — Progress Notes (Signed)
 Cardiology Office Note:  .   Date:  10/26/2023  ID:  Jon [name], DOB 1946-04-28, MRN [id number] PCP: [name], MD  [facility] Providers Cardiologist:  Randene Bustard, MD     Chief Complaint  Patient presents with   Shortness of Breath    Patient c/o tiredness, weakness, leg weakness & shortness of breath with little to no activity.    Coronary Artery Disease    Now status post CABG with recurrent dyspnea    Patient Profile: Jon [name]     Jon [name] is a  78 y.o. male  with a PMH reviewed below who presents here for 1 week follow-up (likely scheduling error)-to discuss the plan for his symptoms of exertional dyspnea and edema.  Originally referred at the request of [name], *.  Initial evaluation in July 2023 for precordial pain exertional dyspnea left arm pain => concern for anginal equivalent but also vascular disease versus potential stroke Brain MRI revealed no acute abnormality but multiple small vessel infarcts consistent with chronic ischemic microangiopathic lesions. Echo was essentially normal EF 55 to 60%.  No RWMA.  Moderate asymmetric LVH.  AOV sclerosis.  Zio patch was relatively benign. Coronary CTA revealed CAC score 562 with ostial LAD 50 to 70% and 70% mid LAD as well as 70 send in the proximal RCA => cardiac cath confirmed similar findings (see below), and peripheral vascular imaging after CTA head and neck revealed left clavian stenosis as well as vertebral artery stenosis.  He eventually was treated with a stent to the left subclavian artery in August 2023. => He was lost to follow-up as far as CAD after the subclavian stent Inferior STEMI 12/11/2022-RCA PCI; recommended outpatient follow-up to discuss CABG. Seen at [facility] office by Varney Gentleman, PA 01/19/2023: Noted ongoing exertional dyspnea that predated his MI but no angina. => Referral made for CVTS consultation.  Transition from Brilinta  to Plavix .  Plan was for outpatient  CVTS consultation and 1-37-month follow-up with me. Admitted to [facility] 01/23/2019 for with dizziness and chest pain.  Transferred to [place]-arrived 01/25/2023 CABGx3 (LIMA-LAD, SVG-PDA, SVG-OM1) in 01/30/2023,  => brief course of postop A-fib converted with amiodarone .  No recurrence therefore no DOAC. => Discharged to SNF-did not follow-up with cardiology posthospitalization-not seen until June 2025 Referred to start cardiac rehab in December  Left subclavian artery stenosis with associated subclavian steal status post stenting in 12/2021,  Remote CVA/TIA,  DM 2,  CKD 3?  B hypertension,  Hyperlipidemia => over the last several months, PCP has gradually reduced statin initially to one half and then finally decided to hold for a couple weeks to see if it made a difference with muscle weakness and short-term memory issues. prior tobacco use with likely emphysema,  senile purpura,  hoarseness associated with parotid mass.  Tendency for medical nonadherence     Jon [name] was just seen on October 16, 2023 by Katlyn West, NP and discussed with me as the DOD at our [employer] in [place].  This was his first cardiology visit since CABG.  He is noting worsening exertional dyspnea over the last 2 to 3 months notes exertional dyspnea but also leg fatigue.  Indicated the symptoms were similar to what he had prior to his CABG.  No necessary evidence of exertional chest pain but just a few pinpoint areas of chest discomfort musculoskeletal.  No PND orthopnea but did note some edema. => After a prolonged visit with  the PA, she talked to me as a primary cardiologist and DOD about recommendations.  We decided to Evaluate with a Stress PET and echo cardiogram.  We also decided to check a CBC and CMP.  Unfortunately, the med list still indicated he was taking 80 mg of atorvastatin  but she was not. => Plan was to then consider the possibility of lower extremity arterial Dopplers given his  known PAD.  Subjective  Discussed the use of AI scribe software for clinical note transcription with the patient, who gave verbal consent to proceed.  History of Present Illness History of Present Illness Jon [name] is a 78 year old male with coronary artery disease and prior bypass surgery who presents with recurrent symptoms of leg weakness and shortness of breath.  He underwent bypass surgery in September of the previous year following a heart attack. Post-surgery, he experienced 'sundowners' syndrome for about a week and was subsequently sent to a nursing home for 22 days. He then received home rehab for 6-10 weeks, followed by additional rehab at a hospital in [place]. He has been to five different rehab locations in nine months.  Two months ago, he began experiencing symptoms reminiscent of his pre-heart attack state, including leg weakness, difficulty walking, and shortness of breath after a few steps. He has fallen several times due to these symptoms, which have persisted. No chest pain has been noted since his heart attack, only shortness of breath.  He had a stroke approximately a year and a half ago, which led to a stent placement. He recalls his right arm going numb at that time, which was addressed with the stent procedure.  He has been experiencing leg weakness that starts at the top of his legs and stops at the knees, rated as a 7 or 8 out of 10 in severity. This weakness occurs after walking up inclines and requires him to stop and rest. He also reports a balance issue, describing a 'wobbling, hard to keep a balance walk' and frequent falls.  He is currently taking metoprolol , Synjardy , semaglutide  (Ozempic ), Plavix , aspirin  (81 mg), and has been off atorvastatin  for about a month due to concerns about leg weakness and nightmares. He was previously on 80 mg of atorvastatin , which was reduced to 40 mg and then stopped. Despite stopping atorvastatin , he reports no  change in leg weakness or nightmares.  His wife notes that he loses his breath quickly during walks and has to hold onto a mailbox to catch his breath. He was initially able to walk for 3-5 minutes post-surgery, but this has decreased over time.     Objective  Current Meds  Medication Sig   aspirin  EC 81 MG tablet Take 1 tablet (81 mg total) by mouth daily. Swallow whole.   atorvastatin  (LIPITOR ) 40 MG tablet Take 1 tablet (40 mg total) by mouth daily. => Currently on hold per primary   clopidogrel  (PLAVIX ) 75 MG tablet Take 1 tablet (75 mg total) by mouth daily.   insulin  degludec (TRESIBA  FLEXTOUCH) 100 UNIT/ML FlexTouch Pen Inject 20 Units into the skin daily.   metoprolol  succinate (TOPROL -XL) 25 MG 24 hr tablet Take 1 tablet (25 mg total) by mouth daily.   Semaglutide , 1 MG/DOSE, 4 MG/3ML SOPN Inject 1 mg into the skin once a week.   SYNJARDY  12.5-500 MG TABS TAKE 1 TABLET BY MOUTH TWICE  DAILY   [DISCONTINUED] atorvastatin  (LIPITOR ) 80 MG tablet TAKE 1 TABLET BY MOUTH AT  BEDTIME -> currently discontinued  by PCP    Studies Reviewed: Jon [name]   EKG Interpretation Date/Time:  Thursday October 22 2023 09:40:45 EDT Ventricular Rate:  67 PR Interval:  174 QRS Duration:  86 QT Interval:  416 QTC Calculation: 439 R Axis:   -59  Text Interpretation: Normal sinus rhythm Left axis deviation Inferior infarct (cited on or before 30-Jan-2023) Anterolateral infarct (cited on or before 31-Jan-2023) When compared with ECG of 16-Oct-2023 09:12, Nonspecific T wave abnormality has replaced inverted T waves in Inferior leads Confirmed by Randene Bustard ([id number]) on 10/22/2023 10:07:31 AM    Left Heart Cath [facility]): Dis LM-Ost LAD 50% , prox-mid LAD eccentric 80% involving 80% ostial D1, distal mid LAD 90% with 70% ostial D2; 80%  prox-mid RI, 60% OM1; calcified eccentric 80% mid RCA (01/02/2022) Recommended CABG.  Lost to follow-up Left Heart Cath - PCI /Revasc for Inf STEMI [facility]): prox RCA 100% (Onyx Frontier  DES 3.5 mm 22 mm); mid-dist LM 75%, prox LAD ~70% & mid~75% , 75% OM1. Echo: EF 50 to 55% with mid to distal inferior wall HK.  GR 1 DD.  Mild MR.  Otherwise normal (12/11/2022)  Lab Results  Component Value Date   NA 140 10/16/2023   K 5.2 10/16/2023   CREATININE 1.53 (H) 10/16/2023   EGFR 47 (L) 10/16/2023   GLUCOSE 108 (H) 10/16/2023   Lab Results  Component Value Date   CHOL 109 12/11/2022   HDL 37 (L) 12/11/2022   LDLCALC 51 12/11/2022   TRIG 103 12/11/2022   CHOLHDL 2.9 12/11/2022   Lab Results  Component Value Date   HGBA1C 6.2 (A) 08/17/2023    Risk Assessment/Calculations:            Physical Exam:   VS:  BP 90/60 (BP Location: Left Arm, Patient Position: Sitting, Cuff Size: Normal)   Pulse 67   Ht 6' 1 (1.854 m)   Wt 219 lb 4 oz (99.5 kg)   SpO2 96%   BMI 28.93 kg/m    Wt Readings from Last 3 Encounters:  10/22/23 219 lb 4 oz (99.5 kg)  10/16/23 221 lb 3.2 oz (100.3 kg)  09/24/23 216 lb 0.6 oz (98 kg)    GEN: Well nourished, well groomed in no acute distress; seems somewhat healthy but memory is very poor.  He is a poor historian, does not answer questions clearly.  Does not seem to understand despite multiple attempts to explain. NECK: No JVD; No carotid bruits CARDIAC: Normal S1, S2; RRR, no murmurs, rubs, gallops RESPIRATORY:  Clear to auscultation without rales, wheezing or rhonchi ; nonlabored, good air movement. ABDOMEN: Soft, non-tender, non-distended EXTREMITIES:  No edema; No deformity     ASSESSMENT AND PLAN: .    Problem List Items Addressed This Visit       Cardiology Problems   Coronary artery disease involving native coronary artery with angina pectoris (HCC) - Primary (Chronic)   Severe multivessel CAD eventually after inferior STEMI he went for CABG for unstable angina despite PCI to the RCA.  He initially did relatively well but is now again noticing exertional dyspnea and fatigue. Recurrence of pre-myocardial infarction symptoms  without angina. Concerns about graft failure or closure. - We have already ordered Cardiac Stress PET (from his recent visit)-scheduled on October 29, 2023, to assess bypass grafts. - We also ordered an echocardiogram to evaluate cardiac function. - Evaluate test results to determine safety for resuming rehabilitation. - Continue ASA/Plavix  DAPT for recent MI.  Can probably stop Plavix   as of September 2025 - Remains on low-dose Toprol  25 mg but with borderline blood pressures would not titrate further. - Remains on Synjardy  started following CABG (12.5-500 mg twice daily), along with semaglutide  for CAD and diabetes - Currently not on statin-on hold because of myalgias and memory issues. => Significant management for additional discussion       Relevant Medications   atorvastatin  (LIPITOR ) 40 MG tablet   Essential hypertension (Chronic)   Not really hypotensive.  Only on low-dose Toprol  25 g with tendency to have low blood pressures.   No further titration.      Relevant Medications   atorvastatin  (LIPITOR ) 40 MG tablet   Other Relevant Orders   EKG 12-Lead (Completed)   Hyperlipidemia due to type 2 diabetes mellitus (HCC) (Chronic)   Has not had lipids checked since his CABG but at that time his LDL was 51.  At that time he was on high-dose statin which is now on hold for evaluation of symptoms.  As his symptoms really have not improved that much, I recommend he goes back on 40 mg atorvastatin . -He needs lipids checked along with LFTs and CK.  If symptoms recur on atorvastatin  40 mg, we will convert to rosuvastatin 20 mg and then consider lipid clinic evaluation if necessary.  Last A1c was 6.2 and he is now on Synjardy  plus semaglutide .  Followed by PCP.      Relevant Medications   atorvastatin  (LIPITOR ) 40 MG tablet   Postural dizziness with near syncope (Chronic)   He does have some dizziness with bending over.  I had previously had him on bisoprolol  2.5 mg and lisinopril  2.5 mg  in the past.  He was probably put on metoprolol  following his CABG instead of hitting attention to his previous meds.  Continue to monitor his pressures and symptoms.  Would consider switching  back to bisoprolol  based on his COPD.      Relevant Medications   atorvastatin  (LIPITOR ) 40 MG tablet   STEMI involving oth coronary artery of inferior wall (HCC) (Chronic)   Inferior STEMI likely related to progression of his RCA disease that was initially seen in July 2023.  Treated with RCA PCI been followed by three-vessel CABG. Preserved EF, but now having exertional dyspnea.  Echo was not checked in the setting of his CABG.  Will check an Echo and stress PET.      Relevant Medications   atorvastatin  (LIPITOR ) 40 MG tablet   Subclavian arterial stenosis (HCC) (Chronic)   Status post subclavian stent. Remains on DAPT with aspirin  81 mg, and Plavix  25 mg due to RCA occlusion stent Other treatment for CAD CRF      Relevant Medications   atorvastatin  (LIPITOR ) 40 MG tablet     Other   Dyspnea on exertion (Chronic)   Dyspnea on exertion.  Will be related to COPD and deconditioning however will need to exclude subacute graft closure.  He also never had an echo at the time of his CABG.  - He already has Echocardiogram and Stress PET Ordered.      Relevant Orders   EKG 12-Lead (Completed)   S/P CABG x 3   Relevant Orders   EKG 12-Lead (Completed)   Other Visit Diagnoses       Medication management       Relevant Orders   CK   Lipid panel   Hepatic function panel        Osteoarthritis with back pain. Previous imaging showed spinal  arthritis. - Continue physical therapy for back pain and leg weakness.     Informed Consent   Shared Decision Making/Informed Consent The risks [chest pain, shortness of breath, cardiac arrhythmias, dizziness, blood pressure fluctuations, myocardial infarction, stroke/transient ischemic attack, nausea, vomiting, allergic reaction, radiation  exposure, metallic taste sensation and life-threatening complications (estimated to be 1 in 10,000)], benefits (risk stratification, diagnosing coronary artery disease, treatment guidance) and alternatives of a cardiac PET stress test were discussed in detail with Mr. Horen and he agrees to proceed.      Follow-Up: Return in about 2 months (around 12/22/2023) for Follow-up as scheduled after tests are performed.  I spent 86 minutes in the care of Karen Kinnard today including reviewing labs (2 minutes), reviewing studies (cath and PCI from STEMI as well as echo reviewed along with CABG report-12 minutes), face to face time discussing treatment options (44), reviewing records from hospitalizations for STEMI unstable angina-CABG, CVTS clinic follow-ups as well as APP follow-ups and PCP follow-ups (13 minutes), 15 minutes dictating, and documenting in the encounter.    Signed, Arleen Lacer, MD, MS Randene Bustard, M.D., M.S. Interventional Chartered certified accountant  Pager # [phone]

## 2023-10-22 NOTE — Patient Instructions (Signed)
 Medication Instructions:  Your physician recommends the following medication changes.  START TAKING: Atorvastatin  40 mg once daily   *If you need a refill on your cardiac medications before your next appointment, please call your pharmacy*  Lab Work: Your provider would like for you to return in 1 week to have the following labs drawn: Lipid Panel, Hepatic Function Panel, CK (creatine kinase).   When you go for your procedure in [facility] do not need an appointment.  They are open from 8 am- 4:30 pm.  Lunch from 1:00 pm- 2:00 pm You DO need to be fasting.   If you have labs (blood work) drawn today and your tests are completely normal, you will receive your results only by: MyChart Message (if you have MyChart) OR A paper copy in the mail If you have any lab test that is abnormal or we need to change your treatment, we will call you to review the results.  Testing/Procedures: None ordered at this time   Follow-Up: At [facility], you and your health needs are our priority.  As part of our continuing mission to provide you with exceptional heart care, our providers are all part of one team.  This team includes your primary Cardiologist (physician) and Advanced Practice Providers or APPs (Physician Assistants and Nurse Practitioners) who all work together to provide you with the care you need, when you need it.  Your next appointment:   2 month(s)--after you have completed your procedures.   Provider:   Randene Bustard, MD    We recommend signing up for the patient portal called MyChart.  Sign up information is provided on this After Visit Summary.  MyChart is used to connect with patients for Virtual Visits (Telemedicine).  Patients are able to view lab/test results, encounter notes, upcoming appointments, etc.  Non-urgent messages can be sent to your provider as well.   To learn more about what you can do with MyChart, go to [email or web address].   Other  Instructions  Please keep your scheduled appointments.

## 2023-10-25 NOTE — Progress Notes (Incomplete)
 Cardiology Office Note:  .   Date:  10/22/2023  ID:  Jon [name], DOB [birth year], MRN [id number] PCP: [name], MD  [facility] Providers Cardiologist:  Randene Bustard, MD { Click to update primary MD,subspecialty MD or APP then REFRESH:1}    Chief Complaint  Patient presents with  . Shortness of Breath    Patient c/o tiredness, weakness, leg weakness & shortness of breath with little to no activity.     Patient Profile: Jon [name]     Jon [name] is a  78 y.o. male  with a PMH reviewed below who presents here for 1 week follow-up (likely scheduling error)-to discuss the plan for his symptoms of exertional dyspnea and edema.  Originally referred at the request of [name], *.  Initial evaluation in July 2023 for precordial pain exertional dyspnea left arm pain => concern for anginal equivalent but also vascular disease versus potential stroke Brain MRI revealed no acute abnormality but multiple small vessel infarcts consistent with chronic ischemic microangiopathic lesions. Echo was essentially normal EF 55 to 60%.  No RWMA.  Moderate asymmetric LVH.  AOV sclerosis.  Zio patch was relatively benign. Coronary CTA revealed CAC score 562 with ostial LAD 50 to 70% and 70% mid LAD as well as 70 send in the proximal RCA => cardiac cath confirmed similar findings (see below), and peripheral vascular imaging after CTA head and neck revealed left clavian stenosis as well as vertebral artery stenosis.  He eventually was treated with a stent to the left subclavian artery in August 2023. => He was lost to follow-up as far as CAD after the subclavian stent Inferior STEMI 12/11/2022-RCA PCI; recommended outpatient follow-up to discuss CABG. Seen at [facility] office by Varney Gentleman, PA 01/19/2023: Noted ongoing exertional dyspnea that predated his MI but no angina. => Referral made for CVTS consultation.  Transition from Brilinta  to Plavix .  Plan was for outpatient CVTS  consultation and 1-37-month follow-up with me. Admitted to [facility] 01/23/2019 for with dizziness and chest pain.  Transferred to [place]-arrived 01/25/2023 CABGx3 (LIMA-LAD, SVG-PDA, SVG-OM1) in 01/30/2023,  => brief course of postop A-fib converted with amiodarone .  No recurrence therefore no DOAC. => Discharged to SNF-did not follow-up with cardiology posthospitalization-not seen until June 2025 Referred to start cardiac rehab in December  Left subclavian artery stenosis with associated subclavian steal status post stenting in 12/2021,  Remote CVA/TIA,  DM 2,  CKD 3?  B hypertension,  Hyperlipidemia => over the last several months, PCP has gradually reduced statin initially to one half and then finally decided to hold for a couple weeks to see if it made a difference with muscle weakness and short-term memory issues. prior tobacco use with likely emphysema,  senile purpura,  hoarseness associated with parotid mass.  Tendency for medical nonadherence     Jon [name] was just seen on October 16, 2023 by Katlyn West, NP and discussed with me as the DOD at our [employer] in [place].  Subjective  Discussed the use of AI scribe software for clinical note transcription with the patient, who gave verbal consent to proceed.  History of Present Illness      Cardiovascular ROS: {roscv:310661}  ROS:  Review of Systems - {ros master:310782}    Objective    Studies Reviewed: Jon [name]        Left Heart Cath [facility]): Dis LM-Ost LAD 50% , prox-mid LAD eccentric 80% involving 80% ostial D1, distal mid LAD  90% with 70% ostial D2; 80%  prox-mid RI, 60% OM1; calcified eccentric 80% mid RCA (01/02/2022) Recommended CABG.  Lost to follow-up Left Heart Cath - PCI /Revasc for Inf STEMI [facility]): prox RCA 100% (Onyx Frontier DES 3.5 mm 22 mm); mid-dist LM 75%, prox LAD ~70% & mid~75% , 75% OM1. Echo: EF 50 to 55% with mid to distal inferior wall HK.  GR 1 DD.  Mild MR.  Otherwise  normal (12/11/2022)  Risk Assessment/Calculations:   {Does this patient have ATRIAL FIBRILLATION?:[id number]}           Physical Exam:   VS:  BP 90/60 (BP Location: Left Arm, Patient Position: Sitting, Cuff Size: Normal)   Pulse 67   Ht 6' 1 (1.854 m)   Wt 219 lb 4 oz (99.5 kg)   SpO2 96%   BMI 28.93 kg/m    Wt Readings from Last 3 Encounters:  10/22/23 219 lb 4 oz (99.5 kg)  10/16/23 221 lb 3.2 oz (100.3 kg)  09/24/23 216 lb 0.6 oz (98 kg)    GEN: Well nourished, well developed in no acute distress; *** NECK: No JVD; No carotid bruits CARDIAC: Normal S1, S2; RRR, no murmurs, rubs, gallops RESPIRATORY:  Clear to auscultation without rales, wheezing or rhonchi ; nonlabored, good air movement. ABDOMEN: Soft, non-tender, non-distended EXTREMITIES:  No edema; No deformity      ASSESSMENT AND PLAN: .    Problem List Items Addressed This Visit       Cardiology Problems   Essential hypertension (Chronic)   Relevant Orders   EKG 12-Lead (Completed)     Other   Precordial pain   Relevant Orders   EKG 12-Lead (Completed)   S/P CABG x 3 - Primary   Relevant Orders   EKG 12-Lead (Completed)   Other Visit Diagnoses       Coronary artery disease involving coronary bypass graft of native heart with other forms of angina pectoris (HCC)       Relevant Orders   EKG 12-Lead (Completed)     Primary hypertension       Relevant Orders   EKG 12-Lead (Completed)     Dyspnea on exertion       Relevant Orders   EKG 12-Lead (Completed)       Assessment and Plan Assessment & Plan        {Are you ordering a CV Procedure (e.g. stress test, cath, DCCV, TEE, etc)?   Press F2        :[id number]}   Follow-Up: No follow-ups on file.  Total time spent: *** min spent with patient + *** min spent charting = *** min    Signed, Arleen Lacer, MD, MS Randene Bustard, M.D., M.S. Interventional Chartered certified accountant  Pager # [phone]

## 2023-10-26 ENCOUNTER — Encounter: Payer: Self-pay | Admitting: Cardiology

## 2023-10-26 NOTE — Assessment & Plan Note (Signed)
 He does have some dizziness with bending over.  I had previously had him on bisoprolol  2.5 mg and lisinopril  2.5 mg in the past.  He was probably put on metoprolol  following his CABG instead of hitting attention to his previous meds.  Continue to monitor his pressures and symptoms.  Would consider switching  back to bisoprolol  based on his COPD.

## 2023-10-26 NOTE — Assessment & Plan Note (Signed)
 Inferior STEMI likely related to progression of his RCA disease that was initially seen in July 2023.  Treated with RCA PCI been followed by three-vessel CABG. Preserved EF, but now having exertional dyspnea.  Echo was not checked in the setting of his CABG.  Will check an Echo and stress PET.

## 2023-10-26 NOTE — Assessment & Plan Note (Signed)
 Dyspnea on exertion.  Will be related to COPD and deconditioning however will need to exclude subacute graft closure.  He also never had an echo at the time of his CABG.  - He already has Echocardiogram and Stress PET Ordered.

## 2023-10-26 NOTE — Assessment & Plan Note (Signed)
 Status post subclavian stent. Remains on DAPT with aspirin  81 mg, and Plavix  25 mg due to RCA occlusion stent Other treatment for CAD CRF

## 2023-10-26 NOTE — Assessment & Plan Note (Signed)
 Severe multivessel CAD eventually after inferior STEMI he went for CABG for unstable angina despite PCI to the RCA.  He initially did relatively well but is now again noticing exertional dyspnea and fatigue. Recurrence of pre-myocardial infarction symptoms without angina. Concerns about graft failure or closure. - We have already ordered Cardiac Stress PET (from his recent visit)-scheduled on October 29, 2023, to assess bypass grafts. - We also ordered an echocardiogram to evaluate cardiac function. - Evaluate test results to determine safety for resuming rehabilitation. - Continue ASA/Plavix  DAPT for recent MI.  Can probably stop Plavix  as of September 2025 - Remains on low-dose Toprol  25 mg but with borderline blood pressures would not titrate further. - Remains on Synjardy  started following CABG (12.5-500 mg twice daily), along with semaglutide  for CAD and diabetes - Currently not on statin-on hold because of myalgias and memory issues. => Significant management for additional discussion

## 2023-10-26 NOTE — Assessment & Plan Note (Addendum)
 Has not had lipids checked since his CABG but at that time his LDL was 51.  At that time he was on high-dose statin which is now on hold for evaluation of symptoms.  As his symptoms really have not improved that much, I recommend he goes back on 40 mg atorvastatin . -He needs lipids checked along with LFTs and CK.  If symptoms recur on atorvastatin  40 mg, we will convert to rosuvastatin 20 mg and then consider lipid clinic evaluation if necessary.  Last A1c was 6.2 and he is now on Synjardy  plus semaglutide .  Followed by PCP.

## 2023-10-26 NOTE — Assessment & Plan Note (Signed)
 Not really hypotensive.  Only on low-dose Toprol  25 g with tendency to have low blood pressures.   No further titration.

## 2023-10-27 NOTE — Telephone Encounter (Signed)
-----   Message from Katlyn D [place] sent at 10/17/2023  5:41 PM EDT ----- Please let Jon [name] know that his CBC shows no evidence of anemia or infection. His kidney function appears close to his baseline. His liver function is normal. His potassium is high normal, recommend he stop any potassium supplements. His electrolytes are otherwise normal. Overall good results, continue with testing as planned.

## 2023-10-27 NOTE — Telephone Encounter (Signed)
 Left message to call back

## 2023-10-28 ENCOUNTER — Encounter (HOSPITAL_COMMUNITY): Payer: Self-pay

## 2023-10-29 ENCOUNTER — Ambulatory Visit
Admission: RE | Admit: 2023-10-29 | Discharge: 2023-10-29 | Disposition: A | Discharge status: Home / Self Care | Source: Ambulatory Visit | Attending: Cardiology | Admitting: Cardiology

## 2023-10-29 DIAGNOSIS — I700 Atherosclerosis of aorta: Secondary | ICD-10-CM | POA: Diagnosis not present

## 2023-10-29 DIAGNOSIS — R531 Weakness: Secondary | ICD-10-CM | POA: Diagnosis not present

## 2023-10-29 DIAGNOSIS — R29898 Other symptoms and signs involving the musculoskeletal system: Secondary | ICD-10-CM

## 2023-10-29 DIAGNOSIS — Z951 Presence of aortocoronary bypass graft: Secondary | ICD-10-CM | POA: Insufficient documentation

## 2023-10-29 DIAGNOSIS — R0609 Other forms of dyspnea: Secondary | ICD-10-CM

## 2023-10-29 DIAGNOSIS — I2510 Atherosclerotic heart disease of native coronary artery without angina pectoris: Secondary | ICD-10-CM | POA: Insufficient documentation

## 2023-10-29 MED ORDER — REGADENOSON 0.4 MG/5ML IV SOLN
INTRAVENOUS | Status: AC
  Filled 2023-10-29: qty 5

## 2023-10-29 MED ORDER — REGADENOSON 0.4 MG/5ML IV SOLN
0.4000 mg | Freq: Once | INTRAVENOUS | Status: AC
  Administered 2023-10-29: 0.4 mg via INTRAVENOUS
  Filled 2023-10-29: qty 5

## 2023-10-29 MED ORDER — RUBIDIUM RB82 GENERATOR (RUBYFILL)
25.0000 | PACK | Freq: Once | INTRAVENOUS | Status: AC
  Administered 2023-10-29: 24.95 via INTRAVENOUS

## 2023-10-29 NOTE — Progress Notes (Signed)
 Patient presents for a cardiac PET stress test and tolerated procedure with shortness of breath for about 30 seconds. Patient maintained acceptable vital signs throughout the test and was offered caffeine after test.  Patient escorted out of department in a wheelchair.

## 2023-10-30 LAB — NM PET CT CARDIAC PERFUSION MULTI W/ABSOLUTE BLOODFLOW
LV dias vol: 67 mL (ref 62–150)
LV sys vol: 52 mL (ref 4.2–5.8)
MBFR: 2.93
Nuc Rest EF: 28 %
Nuc Stress EF: 22 %
Peak HR: 85 {beats}/min
Rest HR: 70 {beats}/min
Rest MBF: 0.74 ml/g/min
Rest Nuclear Isotope Dose: 25 mCi
SRS: 18
SSS: 4
ST Depression (mm): 0 mm
Stress MBF: 2.17 ml/g/min
Stress Nuclear Isotope Dose: 25 mCi
TID: 1.1

## 2023-11-04 ENCOUNTER — Telehealth: Payer: Self-pay | Admitting: Family Medicine

## 2023-11-04 DIAGNOSIS — Z1211 Encounter for screening for malignant neoplasm of colon: Secondary | ICD-10-CM

## 2023-11-04 NOTE — Telephone Encounter (Signed)
 Okay for GI referral.  He lives in [place] now.

## 2023-11-04 NOTE — Telephone Encounter (Signed)
 Copied from CRM [id number]. Topic: Referral - Request for Referral >> Nov 04, 2023 11:29 AM Carmell SAUNDERS wrote: Did the patient discuss referral with their provider in the last year? Yes (If No - schedule appointment) (If Yes - send message)  Appointment offered? No  Type of order/referral and detailed reason for visit: Gastroenterologist for a colonoscopy  Preference of office, provider, location: The doctor's preference  If referral order, have you been seen by this specialty before? Yes (If Yes, this issue or another issue? When? Where? Years ago  Can we respond through MyChart? Yes

## 2023-11-05 ENCOUNTER — Telehealth: Payer: Self-pay | Admitting: Emergency Medicine

## 2023-11-05 ENCOUNTER — Ambulatory Visit: Admitting: Cardiology

## 2023-11-05 NOTE — Telephone Encounter (Signed)
 Left message to call back

## 2023-11-05 NOTE — Telephone Encounter (Signed)
-----   Message from Katlyn D [place] sent at 11/03/2023  6:40 PM EDT ----- Please let Jon [name] know that his stress test was overall reassuring. There was no evidence of decreased blood flow to the heart muscle, there is evidence of scarring, likely from prior MI. The test  was unable to determine an accurate LVEF (heart squeeze) as a result of artifact, this will be evaluated on echocardiogram scheduled for July. Reviewed results with Dr. Anner, who agreed test was  overall reassuring, recommend patient follow up after echocardiogram.  ----- Message ----- From: Interface, Rad Results In Sent: 10/30/2023   8:24 AM EDT To: Katlyn D West, NP

## 2023-11-05 NOTE — Telephone Encounter (Signed)
 Called the patient and left voice mail to call back.  Called patient again and the phone went to voicemail.  Then called the spouse's phone and she answered the phone and gave the phone to the patient.  Spoke to the patient regarding rescheduling the office visit after ECHO on 12/02/23 per Dr. Anner. ...really need to wait til Echo is Done b/c the PET CT did not get good EF result & indicated LOW EF -- I am not sure what I would do differently until I get Echo done. Patient verbalized understanding with all questions and concerns addressed.  Patient's appointment has been rescheduled with Dr. Anner on 12/10/23 at 0900.

## 2023-11-09 ENCOUNTER — Telehealth: Payer: Self-pay | Admitting: Family Medicine

## 2023-11-09 ENCOUNTER — Encounter (INDEPENDENT_AMBULATORY_CARE_PROVIDER_SITE_OTHER): Payer: Self-pay

## 2023-11-09 ENCOUNTER — Encounter: Payer: Self-pay | Admitting: Gastroenterology

## 2023-11-09 DIAGNOSIS — R499 Unspecified voice and resonance disorder: Secondary | ICD-10-CM

## 2023-11-09 NOTE — Telephone Encounter (Signed)
 Copied from CRM [id number]. Topic: Referral - Request for Referral >> Nov 09, 2023 10:22 AM Graeme ORN wrote: Did the patient discuss referral with their provider in the last year? Yes within the past 6 months  (If No - schedule appointment) (If Yes - send message)  Appointment offered? No  Type of order/referral and detailed reason for visit: Ear Nose and Throat - loses voice every day around 11am  Preference of office, provider, location: N/A no preference   If referral order, have you been seen by this specialty before? Yes  and [employer]  (If Yes, this issue or another issue? When? Where?  Can we respond through MyChart? No - would like Jon to give him a call

## 2023-11-09 NOTE — Telephone Encounter (Signed)
 Left message for a return call

## 2023-11-09 NOTE — Telephone Encounter (Signed)
 Jon can you call him in regards to below.  Okay to place ENT referral.  I think he still lives in [place] now  Orders Placed This Encounter  Procedures   Ambulatory referral to ENT    Referral Priority:   Routine    Referral Type:   Consultation    Referral Reason:   Specialty Services Required    Requested Specialty:   Otolaryngology    Number of Visits Requested:   1

## 2023-11-10 ENCOUNTER — Ambulatory Visit (HOSPITAL_BASED_OUTPATIENT_CLINIC_OR_DEPARTMENT_OTHER): Admitting: Physical Therapy

## 2023-11-10 NOTE — Telephone Encounter (Signed)
 Attn: Bonny Jon DEL, CMA    Mr. Eyerman calling he said this is his second call and did not want to hold on. He is asking for Jon to call him back.

## 2023-11-10 NOTE — Telephone Encounter (Signed)
 Patient is returning Jon [name]'s call, attempted to transfer to clinic but patient hung up before transferring.

## 2023-11-11 NOTE — Telephone Encounter (Signed)
 Patient advised.

## 2023-11-12 ENCOUNTER — Encounter (INDEPENDENT_AMBULATORY_CARE_PROVIDER_SITE_OTHER): Payer: Self-pay | Admitting: Otolaryngology

## 2023-11-12 ENCOUNTER — Ambulatory Visit (INDEPENDENT_AMBULATORY_CARE_PROVIDER_SITE_OTHER): Admitting: Otolaryngology

## 2023-11-12 VITALS — BP 124/76 | HR 76

## 2023-11-12 DIAGNOSIS — R490 Dysphonia: Secondary | ICD-10-CM

## 2023-11-12 DIAGNOSIS — K219 Gastro-esophageal reflux disease without esophagitis: Secondary | ICD-10-CM

## 2023-11-12 DIAGNOSIS — J383 Other diseases of vocal cords: Secondary | ICD-10-CM

## 2023-11-12 NOTE — Telephone Encounter (Signed)
-----   Message from Katlyn D [place] sent at 11/03/2023  6:40 PM EDT ----- Please let Jon [name] know that his stress test was overall reassuring. There was no evidence of decreased blood flow to the heart muscle, there is evidence of scarring, likely from prior MI. The test  was unable to determine an accurate LVEF (heart squeeze) as a result of artifact, this will be evaluated on echocardiogram scheduled for July. Reviewed results with Dr. Anner, who agreed test was  overall reassuring, recommend patient follow up after echocardiogram.  ----- Message ----- From: Interface, Rad Results In Sent: 10/30/2023   8:24 AM EDT To: Katlyn D West, NP

## 2023-11-12 NOTE — Telephone Encounter (Signed)
No way to leave message

## 2023-11-12 NOTE — Progress Notes (Signed)
 ENT CONSULT:  Reason for Consult: hoarseness dysphonia  x 2 yrs  HPI: Discussed the use of AI scribe software for clinical note transcription with the patient, who gave verbal consent to proceed.  History of Present Illness  60 yoM former smoker quit 2 yrs ago, hx of CABG 1.5 yrs ago, hx of right carotid body tumor removal, here for intermittent dysphonia worse in the morning.   He was told he has COPD. He is on reflux medication. No dysphagia sx. He has shortness of breath due to cardiac issues and deconditioning.   He had scope by an ENT done 2 yrs ago, and was told he has GERD.     Records Reviewed:  Cardiology note 10/16/23 CAD/dyspnea on exertion: S/p PCI/DES to the RCA in setting of inferior STEMI in 12/2022.  Previously recommended to be evaluated by CV TS for CABG multiple times however patient canceled appointment.  Patient was admitted in 01/2019 for with unstable angina, underwent CABG x 3 with LIMA to LAD, SVG to PDA and SVG to OM with endoscopic harvest of greater saphenous vein from his right leg on 01/30/2023. Worsening shortness of breath in the last 2 months, notes that following his bypass surgery this had improved.  He denies any significant chest pain or chest pain with exertion, he notes a pinpoint chest pain at his midsternal incision related to nerve pain.  Discussed with Dr. Anner, he recommended proceeding with a cardiac PET stress test for further evaluation.  Patient is in agreement, please see consent below.  Will also check echocardiogram.  Reviewed ED precautions.  Continue aspirin  80 g daily, 80 mg daily, Plavix  75 mg daily, metoprolol  succinate 25 mg daily. Check CBC, CMET.     Past Medical History:  Diagnosis Date   BENIGN POSITIONAL VERTIGO 05/20/2010   Qualifier: Diagnosis of  By: Alvan MD, Dorothyann Burkes emphysema [facility])    Cerebrovascular disease 07/15/2018   Prior CVA & TIA. Carotid CTA ~50% Bilateral ICA,  Multifocal severe stenosis of  the left V2 vertebral artery.  Right vertebral artery patent without significant stenosis.   Coronary artery disease involving native coronary artery with angina pectoris (HCC) 11/18/2017   Severe LM, LAD & RCA stenosis indicated on Coronary CTA => CaCardiac Cath 01/02/2022: dLM ~50%; mLAD 85%@D1  90% (1,1,1) - m-dLAD 90$ @ 65% D2 (0,1,1), RI 75%, pLCx 30%-OM1 60%, pRCA 25% - p-mRCA hazy 85%. Normal LVEF by Echo, EDP 16 mmHg. => CABG x 3 (LIMA-LAD, SVG-OM1, SVG-PDA) 01/2023   Current every day smoker    No interest in quitting   Diabetes mellitus, type II, insulin  dependent (HCC)    With peripheral neuropathy, peripheral vascular disease and coronary artery disease as complications.   Epididymitis    History of acute myocardial infarction of inferior wall 12/2022   Progression of mid RCA disease to 100%-DES PCI with Onyx Frontier DES   Hx of CABG 01/30/2023   Dr. Lucas: LIMA-LAD, SVG-OM1, SVG-PDA   Hyperlipemia    Hypertension    Stroke (HCC) 11/2009   Subclavian steal syndrome of left subclavian artery 11/19/2021   Status post left subclavian stent   TIA (transient ischemic attack) 11/11/2021   Brief episode of left sided arm pain and slurred speech    Past Surgical History:  Procedure Laterality Date   CORONARY ARTERY BYPASS GRAFT N/A 01/30/2023   Procedure: CORONARY ARTERY BYPASS GRAFTING TIMES THREE USING LEFT INTERNAL MAMMARY ARTERY AND ENDOHARVEST OF RIGHT LEG GREATER SAPHENOUS VEIN;  Surgeon: Lucas Dorise POUR, MD;  Location: [facility] OR;  Service: Open Heart Surgery;; LIMA-LAD, SVG-OM1, SVG-PDA   CORONARY/GRAFT ACUTE MI REVASCULARIZATION N/A 12/11/2022   Procedure: Coronary/Graft Acute MI Revascularization;  Surgeon: Ammon Blunt, MD;  Location: [facility] INVASIVE CV LAB;  Service: Cardiovascular;;  Inf STEMI [facility]): prox RCA 100% (Onyx Frontier DES 3.5 mm 22 mm)   LEFT HEART CATH AND CORONARY ANGIOGRAPHY N/A 01/02/2022   Procedure: LEFT HEART CATH AND CORONARY ANGIOGRAPHY;  Surgeon:  Anner Alm ORN, MD;  Location: [facility] INVASIVE CV LAB:: dLM ~50%; mLAD 85%@D1  90% (1,1,1) - m-dLAD 90$ @ 65% D2 (0,1,1), RI 75%, pLCx 30%-OM1 60%, pRCA 25% - p-mRCA hazy 85%. Normal LVEF by Echo, EDP 16 mmHg. = Best option CABG   LEFT HEART CATH AND CORONARY ANGIOGRAPHY N/A 12/11/2022   Procedure: LEFT HEART CATH AND CORONARY ANGIOGRAPHY;  Surgeon: Ammon Blunt, MD;  Location: [facility] INVASIVE CV LAB;  Service: Cardiovascular;; Inf STEMI: prox RCA 100% (Onyx Frontier DES 3.5 mm 22 mm); mid-dist LM 75%, prox LAD ~70% & mid~75% , 75% OM1.   PILONIDAL CYST EXCISION  1960, 61, 62, 63   TEE WITHOUT CARDIOVERSION N/A 01/30/2023   Procedure: TRANSESOPHAGEAL ECHOCARDIOGRAM;  Surgeon: Lucas Dorise POUR, MD;  Location: [facility] OR;  Service: Open Heart Surgery;  Laterality: N/A;   TRANSTHORACIC ECHOCARDIOGRAM  11/28/2021   EF 55 to 60%.  No RWMA.  Moderate asymmetric LVH-basal septal.  GR 1 DD.  Normal RV.  Normal MV.  AOV sclerosis without stenosis.  Normal RAP.   TRANSTHORACIC ECHOCARDIOGRAM  12/11/2022   In the setting of Inferior STEMI: EF 50 to 55% with mid to distal inferior wall HK.  GR 1 DD.  Mild MR.  Otherwise normal   UPPER EXTREMITY ANGIOGRAPHY Left 12/17/2021   Procedure: Upper Extremity Angiography;  Surgeon: Jama Cordella MATSU, MD;  Location: [facility] INVASIVE CV LAB;  Service: Cardiovascular:: High-grade stenosis just distal to the origin of the L-Subclavian A, normal Innom A & L Carotid A => Ost-Prox L SubCl A 8x37x80 Stent - dilated with 9 mm balloon.   VASECTOMY     Zio Patch Monitor  11/2021   Predom Rhtyhm: SR - HR range 47-98 bpm, AVG 64 bpm.  3 runs of NS VT  (> 4 PVC beats): Fastest: 5 beats - HR 125-162 bpm, ~ 145 bpm, 2.1 sec; Longest with Fastest Avg-12 beats, 150-158 bpm, ~ 155 bpm, 4.6 sec.  1  5 beat Atrial Run: HR 125, Avg 120 bpm,   No Sustained Arrhythmias,   Occasional isolated PACs (2.1%) & Rare isolated PVCs (<1%).  No patient triggered events.    Family History  Problem  Relation Age of Onset   Diabetes Other        family hx of   Colon cancer Neg Hx     Social History:  reports that he quit smoking about 22 months ago. His smoking use included cigarettes. He started smoking about 63 years ago. He has a 62 pack-year smoking history. He has never used smokeless tobacco. He reports that he does not drink alcohol and does not use drugs.  Allergies:  Allergies  Allergen Reactions   Gabapentin Other (See Comments)    Nightmares.    Lisinopril  Other (See Comments)    Hypotension   Metformin  And Related Diarrhea    Medications: I have reviewed the patient's current medications.  The PMH, PSH, Medications, Allergies, and SH were reviewed and updated.  ROS: Constitutional: Negative for fever, weight loss and  weight gain. Cardiovascular: Negative for chest pain and dyspnea on exertion. Respiratory: Is not experiencing shortness of breath at rest. Gastrointestinal: Negative for nausea and vomiting. Neurological: Negative for headaches. Psychiatric: The patient is not nervous/anxious  Blood pressure 124/76, pulse 76, SpO2 95%. There is no height or weight on file to calculate BMI.  PHYSICAL EXAM:  Exam: General: Well-developed, well-nourished Communication and Voice: intermittently raspy Respiratory Respiratory effort: Equal inspiration and expiration without stridor Cardiovascular Peripheral Vascular: Warm extremities with equal color/perfusion Eyes: No nystagmus with equal extraocular motion bilaterally Neuro/Psych/Balance: Patient oriented to person, place, and time; Appropriate mood and affect; Gait is intact with no imbalance; Cranial nerves I-XII are intact Head and Face Inspection: Normocephalic and atraumatic without mass or lesion Palpation: Facial skeleton intact without bony stepoffs Salivary Glands: No mass or tenderness Facial Strength: Facial motility symmetric and full bilaterally ENT Pinna: External ear intact and fully  developed External canal: Canal is patent with intact skin Tympanic Membrane: Clear and mobile External Nose: No scar or anatomic deformity Internal Nose: Septum is deviated to the left. No polyp, or purulence. Mucosal edema and erythema present.  Bilateral inferior turbinate hypertrophy.  Lips, Teeth, and gums: Mucosa and teeth intact and viable TMJ: No pain to palpation with full mobility Oral cavity/oropharynx: No erythema or exudate, no lesions present Nasopharynx: No mass or lesion with intact mucosa Hypopharynx: Intact mucosa without pooling of secretions Larynx Glottic: Full true vocal cord mobility without lesion or mass VF atrophy Supraglottic: Normal appearing epiglottis and AE folds Interarytenoid Space: Moderate pachydermia&edema Subglottic Space: Patent without lesion or edema Neck Neck and Trachea: Midline trachea without mass or lesion Thyroid : No mass or nodularity Lymphatics: No lymphadenopathy  Procedure: Preoperative diagnosis: dysphonia  Postoperative diagnosis:   Same + GERD LPR VF atrophy glottic insufficiency   Procedure: Flexible fiberoptic laryngoscopy  Surgeon: Elena Larry, MD  Anesthesia: Topical lidocaine  and Afrin Complications: None Condition is stable throughout exam  Indications and consent:  The patient presents to the clinic with above symptoms. Indirect laryngoscopy view was incomplete. Thus it was recommended that they undergo a flexible fiberoptic laryngoscopy. All of the risks, benefits, and potential complications were reviewed with the patient preoperatively and verbal informed consent was obtained.  Procedure: The patient was seated upright in the clinic. Topical lidocaine  and Afrin were applied to the nasal cavity. After adequate anesthesia had occurred, I then proceeded to pass the flexible telescope into the nasal cavity. The nasal cavity was patent without rhinorrhea or polyp. The nasopharynx was also patent without mass or lesion.  The base of tongue was visualized and was normal. There were no signs of pooling of secretions in the piriform sinuses. The true vocal folds were mobile bilaterally. There were no signs of glottic or supraglottic mucosal lesion or mass. There was moderate interarytenoid pachydermia and post cricoid edema. The telescope was then slowly withdrawn and the patient tolerated the procedure throughout.    Studies Reviewed: CXR 03/04/23 FINDINGS: Sternotomy wires are again seen. The heart size and mediastinal contours are within normal limits. Both lungs are clear. The visualized skeletal structures are unremarkable.   IMPRESSION: No active cardiopulmonary disease.  Assessment/Plan: Encounter Diagnoses  Name Primary?   Glottic insufficiency Yes   Dysphonia    Hoarseness    Age-related vocal fold atrophy    Chronic GERD     Assessment and Plan Assessment & Plan Dysphonia x 2 yrs  Flexible scope exam w/o masses or lesions, normal movement of both VF, mild  VF atrophy and glottic insufficiency. Suspected sub-optimal breath support. We discussed voice therapy and respiratory rehab. He has lots of health issues and would like to hold off  - continue to monitor   GERD LPR -  Reflux Gourmet after meals - diet and lifestyle changes to minimize GERD - Refer to [employer] blog for dietary and lifestyle modifications/reflux cook book       Thank you for allowing me to participate in the care of this patient. Please do not hesitate to contact me with any questions or concerns.   Elena Larry, MD Otolaryngology [facility] Phone: [phone] Fax: [phone]    11/12/2023, 2:12 PM

## 2023-11-14 ENCOUNTER — Encounter (HOSPITAL_BASED_OUTPATIENT_CLINIC_OR_DEPARTMENT_OTHER): Admitting: Rehabilitative and Restorative Service Providers"

## 2023-11-17 LAB — HM DIABETES EYE EXAM

## 2023-11-18 ENCOUNTER — Ambulatory Visit (HOSPITAL_BASED_OUTPATIENT_CLINIC_OR_DEPARTMENT_OTHER): Admitting: Physical Therapy

## 2023-11-19 NOTE — Telephone Encounter (Signed)
 Called patient advised of below they verbalized understanding.

## 2023-11-19 NOTE — Telephone Encounter (Signed)
-----   Message from Katlyn D [place] sent at 11/03/2023  6:40 PM EDT ----- Please let Mr. Haque know that his stress test was overall reassuring. There was no evidence of decreased blood flow to the heart muscle, there is evidence of scarring, likely from prior MI. The test  was unable to determine an accurate LVEF (heart squeeze) as a result of artifact, this will be evaluated on echocardiogram scheduled for July. Reviewed results with Dr. Anner, who agreed test was  overall reassuring, recommend patient follow up after echocardiogram.  ----- Message ----- From: Interface, Rad Results In Sent: 10/30/2023   8:24 AM EDT To: Katlyn D West, NP

## 2023-11-20 ENCOUNTER — Encounter (HOSPITAL_BASED_OUTPATIENT_CLINIC_OR_DEPARTMENT_OTHER)

## 2023-11-24 ENCOUNTER — Ambulatory Visit (HOSPITAL_BASED_OUTPATIENT_CLINIC_OR_DEPARTMENT_OTHER): Admitting: Physical Therapy

## 2023-11-24 ENCOUNTER — Ambulatory Visit (INDEPENDENT_AMBULATORY_CARE_PROVIDER_SITE_OTHER): Admitting: Family Medicine

## 2023-11-24 ENCOUNTER — Encounter: Payer: Self-pay | Admitting: Family Medicine

## 2023-11-24 ENCOUNTER — Ambulatory Visit: Admitting: Family Medicine

## 2023-11-24 VITALS — BP 120/79 | HR 71 | Ht 73.0 in | Wt 218.1 lb

## 2023-11-24 DIAGNOSIS — Z794 Long term (current) use of insulin: Secondary | ICD-10-CM | POA: Diagnosis not present

## 2023-11-24 DIAGNOSIS — M545 Low back pain, unspecified: Secondary | ICD-10-CM

## 2023-11-24 DIAGNOSIS — J432 Centrilobular emphysema: Secondary | ICD-10-CM

## 2023-11-24 DIAGNOSIS — E119 Type 2 diabetes mellitus without complications: Secondary | ICD-10-CM | POA: Diagnosis not present

## 2023-11-24 DIAGNOSIS — Z951 Presence of aortocoronary bypass graft: Secondary | ICD-10-CM

## 2023-11-24 LAB — POCT GLYCOSYLATED HEMOGLOBIN (HGB A1C): Hemoglobin A1C: 6.5 % — AB (ref 4.0–5.6)

## 2023-11-24 NOTE — Progress Notes (Unsigned)
 SABRA

## 2023-11-24 NOTE — Assessment & Plan Note (Signed)
 He is back on his statin, aspirin  and Plavix .  Metoprolol  as well.

## 2023-11-24 NOTE — Progress Notes (Unsigned)
   Established Patient Office Visit  Subjective  Patient ID: Jon [name], male    DOB: [birth year]  Age: 78 y.o. MRN: [id number]  Chief Complaint  Patient presents with  . Diabetes    HPI  He recently saw Dr. Maureen of [facility], ENT for glottic insufficiency.  He has been having issues with dysphonia for at least the last 2 years they did do flexible scope exam at that visit on July 3 and it was negative for any mass or lesions.  He does take reflux medicine.  They did recommend voice therapy and respiratory rehab.  He wanted to hold off for now.  They also recommended dietary and lifestyle changes to minimize GERD symptoms.  Diabetes - no hypoglycemic events. No wounds or sores that are not healing well. No increased thirst or urination. Checking glucose at home. Taking medications as prescribed without any side effects.  We increased his Ozempic  to 1 mg when at last saw him in April and A1c was 6.2.  My hope was to be able to taper off the Tresiba  if he was otherwise doing well.  Is also been struggling for several months with increased shortness of breath tired Notes weakness of the lower extremities.  He did discuss this with cardiology when he was seen in June.  His stamina for walking has actually decreased as he gets more short of breath with activity.  Did have a follow-up cardiac PET/CT done on June 19 which showed no acute findings.  Evidence of old infarction. Echo scheduled.   {History (Optional):23778}  ROS    Objective:     BP 120/79   Pulse 71   Ht 6' 1 (1.854 m)   Wt 218 lb 1.9 oz (98.9 kg)   SpO2 100%   BMI 28.78 kg/m  {Vitals History (Optional):23777}  Physical Exam Vitals and nursing note reviewed.  Constitutional:      Appearance: Normal appearance.  HENT:     Head: Normocephalic and atraumatic.  Eyes:     Conjunctiva/sclera: Conjunctivae normal.  Cardiovascular:     Rate and Rhythm: Normal rate and regular rhythm.  Pulmonary:     Effort: Pulmonary  effort is normal.     Breath sounds: Normal breath sounds.  Skin:    General: Skin is warm and dry.  Neurological:     Mental Status: He is alert.  Psychiatric:        Mood and Affect: Mood normal.      No results found for any visits on 11/24/23.  {Labs (Optional):23779}  The ASCVD Risk score (Arnett DK, et al., 2019) failed to calculate for the following reasons:   Risk score cannot be calculated because patient has a medical history suggesting prior/existing ASCVD    Assessment & Plan:   Problem List Items Addressed This Visit       Respiratory   Centrilobular emphysema (HCC) (Chronic)     Endocrine   Diabetes mellitus, type II, insulin  dependent (HCC) - Primary   Relevant Orders   POCT HgB A1C     Other   S/P CABG x 3   He is back on his statin, aspirin  and Plavix .  Metoprolol  as well.       Return in about 3 months (around 02/24/2024) for Diabetes follow-up.    Dorothyann Byars, MD

## 2023-11-26 ENCOUNTER — Ambulatory Visit (HOSPITAL_BASED_OUTPATIENT_CLINIC_OR_DEPARTMENT_OTHER)
Admission: RE | Admit: 2023-11-26 | Discharge: 2023-11-26 | Disposition: A | Discharge status: Home / Self Care | Source: Ambulatory Visit | Attending: Cardiology | Admitting: Cardiology

## 2023-11-26 ENCOUNTER — Ambulatory Visit (HOSPITAL_COMMUNITY)
Admission: RE | Admit: 2023-11-26 | Discharge: 2023-11-26 | Disposition: A | Discharge status: Home / Self Care | Source: Ambulatory Visit | Attending: Cardiology | Admitting: Cardiology

## 2023-11-26 DIAGNOSIS — R29898 Other symptoms and signs involving the musculoskeletal system: Secondary | ICD-10-CM | POA: Diagnosis not present

## 2023-11-26 DIAGNOSIS — R0609 Other forms of dyspnea: Secondary | ICD-10-CM | POA: Diagnosis not present

## 2023-11-26 DIAGNOSIS — I25708 Atherosclerosis of coronary artery bypass graft(s), unspecified, with other forms of angina pectoris: Secondary | ICD-10-CM | POA: Diagnosis not present

## 2023-11-27 ENCOUNTER — Encounter (HOSPITAL_BASED_OUTPATIENT_CLINIC_OR_DEPARTMENT_OTHER)

## 2023-11-27 DIAGNOSIS — M545 Low back pain, unspecified: Secondary | ICD-10-CM | POA: Insufficient documentation

## 2023-11-27 NOTE — Assessment & Plan Note (Signed)
 He does have some underlying emphysema which I do think contributes to some of his shortness of breath but he is undergoing a pretty thorough cardiac evaluation to make sure that the bypass was still successful since he did not get the close follow-up he needed initially.  He did complete cardiac rehab.

## 2023-11-27 NOTE — Assessment & Plan Note (Addendum)
 A1c looks fantastic today at 6.5.  He is actually doing well with the semaglutide .  We can consider working to increase his dose up to 2 mg so that he might be able to decrease how much insulin  he is using.  Would like for him to get through some of the cardiac workup at least initially continue with Synjardy  as well.  Lab Results  Component Value Date   HGBA1C 6.5 (A) 11/24/2023

## 2023-11-27 NOTE — Assessment & Plan Note (Signed)
 His back pain actually has been better in the last week and a half on its own.  So he is holding off on any additional treatments right now.

## 2023-11-29 LAB — VAS US ABI WITH/WO TBI
Left ABI: 0.8
Right ABI: 0.79

## 2023-12-01 ENCOUNTER — Ambulatory Visit (HOSPITAL_BASED_OUTPATIENT_CLINIC_OR_DEPARTMENT_OTHER): Admitting: Physical Therapy

## 2023-12-02 ENCOUNTER — Ambulatory Visit (HOSPITAL_COMMUNITY)
Admission: RE | Admit: 2023-12-02 | Discharge: 2023-12-02 | Disposition: A | Discharge status: Home / Self Care | Source: Ambulatory Visit | Attending: Internal Medicine | Admitting: Internal Medicine

## 2023-12-02 ENCOUNTER — Telehealth: Payer: Self-pay

## 2023-12-02 DIAGNOSIS — R29898 Other symptoms and signs involving the musculoskeletal system: Secondary | ICD-10-CM | POA: Diagnosis not present

## 2023-12-02 DIAGNOSIS — R0609 Other forms of dyspnea: Secondary | ICD-10-CM | POA: Diagnosis not present

## 2023-12-02 LAB — ECHOCARDIOGRAM COMPLETE: S' Lateral: 2.31 cm

## 2023-12-02 NOTE — Telephone Encounter (Signed)
 Left message for a return call

## 2023-12-02 NOTE — Telephone Encounter (Signed)
 Copied from CRM #[id number]. Topic: General - Other >> Dec 02, 2023  2:48 PM Susanna ORN wrote: Reason for CRM: Patient requesting that nurse Jon give him a call. He did not give a specific reason for the call but just stated that he always speak with her and she assists him. CB #: [phone].

## 2023-12-02 NOTE — Telephone Encounter (Signed)
 See other message

## 2023-12-03 ENCOUNTER — Encounter (HOSPITAL_BASED_OUTPATIENT_CLINIC_OR_DEPARTMENT_OTHER): Admitting: Rehabilitative and Restorative Service Providers"

## 2023-12-03 NOTE — Telephone Encounter (Signed)
 Attempted call to patient. Left a voice mail message requesting a return call.

## 2023-12-08 ENCOUNTER — Ambulatory Visit (HOSPITAL_BASED_OUTPATIENT_CLINIC_OR_DEPARTMENT_OTHER): Admitting: Physical Therapy

## 2023-12-09 NOTE — Telephone Encounter (Signed)
 Spoke to patient who didn't remember why he'd called originally. He is experiencing numbness and weakness in his arm. Tomorrow he will see Cardiology and callback for Jon, if needed

## 2023-12-10 ENCOUNTER — Ambulatory Visit: Attending: Cardiology | Admitting: Cardiology

## 2023-12-10 ENCOUNTER — Encounter (HOSPITAL_BASED_OUTPATIENT_CLINIC_OR_DEPARTMENT_OTHER): Admitting: Physical Therapy

## 2023-12-10 ENCOUNTER — Encounter: Payer: Self-pay | Admitting: Cardiology

## 2023-12-10 VITALS — BP 108/66 | HR 72 | Ht 74.0 in | Wt 217.0 lb

## 2023-12-10 DIAGNOSIS — R55 Syncope and collapse: Secondary | ICD-10-CM

## 2023-12-10 DIAGNOSIS — I10 Essential (primary) hypertension: Secondary | ICD-10-CM | POA: Diagnosis not present

## 2023-12-10 DIAGNOSIS — E1169 Type 2 diabetes mellitus with other specified complication: Secondary | ICD-10-CM

## 2023-12-10 DIAGNOSIS — I2119 ST elevation (STEMI) myocardial infarction involving other coronary artery of inferior wall: Secondary | ICD-10-CM | POA: Diagnosis not present

## 2023-12-10 DIAGNOSIS — I739 Peripheral vascular disease, unspecified: Secondary | ICD-10-CM | POA: Diagnosis not present

## 2023-12-10 DIAGNOSIS — E1122 Type 2 diabetes mellitus with diabetic chronic kidney disease: Secondary | ICD-10-CM | POA: Diagnosis not present

## 2023-12-10 DIAGNOSIS — R42 Dizziness and giddiness: Secondary | ICD-10-CM | POA: Diagnosis not present

## 2023-12-10 DIAGNOSIS — R0609 Other forms of dyspnea: Secondary | ICD-10-CM | POA: Diagnosis not present

## 2023-12-10 DIAGNOSIS — Z79899 Other long term (current) drug therapy: Secondary | ICD-10-CM | POA: Diagnosis not present

## 2023-12-10 DIAGNOSIS — Z951 Presence of aortocoronary bypass graft: Secondary | ICD-10-CM

## 2023-12-10 DIAGNOSIS — I2510 Atherosclerotic heart disease of native coronary artery without angina pectoris: Secondary | ICD-10-CM | POA: Diagnosis not present

## 2023-12-10 DIAGNOSIS — I679 Cerebrovascular disease, unspecified: Secondary | ICD-10-CM

## 2023-12-10 DIAGNOSIS — Z955 Presence of coronary angioplasty implant and graft: Secondary | ICD-10-CM | POA: Diagnosis not present

## 2023-12-10 DIAGNOSIS — N183 Chronic kidney disease, stage 3 unspecified: Secondary | ICD-10-CM

## 2023-12-10 DIAGNOSIS — E785 Hyperlipidemia, unspecified: Secondary | ICD-10-CM

## 2023-12-10 NOTE — Progress Notes (Signed)
 Cardiology Office Note:  .   Date:  12/10/2023  ID:  Jon [name], DOB 09/28/1945, MRN [id number] PCP Referring Provider: Alvan Dorothyann BIRCH, MD  [facility] Providers Cardiologist:  Alm Clay, MD    Vascular Surgeon: Dr. Jama   Chief Complaint  Patient presents with   Follow up Cardiac test     Doing well.  Here to discuss test results   Coronary Artery Disease    No active chest pain just exertional dyspnea    Patient Profile: Jon [name]     Jon [name] is a  78 y.o. male  with a PMH noted below who presents here for close follow-up evaluation to discuss test results.   Referring Provider: Alvan Dorothyann BIRCH, *.  Initial evaluation in July 2023 for precordial pain exertional dyspnea left arm pain => concern for anginal equivalent but also vascular disease versus potential stroke Brain MRI revealed no acute abnormality but multiple small vessel infarcts consistent with chronic ischemic microangiopathic lesions. Echo was essentially normal EF 55 to 60%.  No RWMA.  Moderate asymmetric LVH.  AOV sclerosis.  Zio patch was relatively benign. Coronary CTA revealed CAC score 562 with ostial LAD 50 to 70% and 70% mid LAD as well as 70 send in the proximal RCA => cardiac cath confirmed similar findings (see below), and peripheral vascular imaging after CTA head and neck revealed left clavian stenosis as well as vertebral artery stenosis.  He eventually was treated with a stent to the left subclavian artery in August 2023. => He was lost to follow-up as far as CAD after the subclavian stent Inferior STEMI 12/11/2022-RCA PCI; recommended outpatient follow-up to discuss CABG. Seen at [facility] office by Bernardino Bring, PA 01/19/2023: Noted ongoing exertional dyspnea that predated his MI but no angina. => Referral made for CVTS consultation.  Transition from Brilinta  to Plavix .  Plan was for outpatient CVTS consultation and 1-75-month follow-up with me. Admitted to [facility]  01/23/2019 for with dizziness and chest pain.  Transferred to [facility]-arrived 01/25/2023 CABGx3 (LIMA-LAD, SVG-PDA, SVG-OM1) in 01/30/2023,  => brief course of postop A-fib converted with amiodarone .  No recurrence therefore no DOAC. => Discharged to SNF-did not follow-up with cardiology posthospitalization-not seen until June 2025 Referred to start cardiac rehab in December  Left subclavian artery stenosis with associated subclavian steal status post stenting in 12/2021,  Remote CVA/TIA,  DM 2,  CKD 3?  B hypertension,  Hyperlipidemia => over the last several months, PCP has gradually reduced statin initially to one half and then finally decided to hold for a couple weeks to see if it made a difference with muscle weakness and short-term memory issues. prior tobacco use with likely emphysema,  senile purpura,  hoarseness associated with parotid mass.  Tendency for medical nonadherence     Diyan Dave was last seen on October 22, 2023 for a 1 week follow-up after seeing Katlyn West, NP on 10/16/2023 as his first post CABG cardiology follow-up.  There is suggestion of worsening exertional dyspnea over the last 2 to 3 months similar to pre-CABG.  Also noted leg weakness and fatigue with poor balance. => He had already had an echocardiogram, stress PET as well as LEA ABIs and Dopplers, unfortunately this visit was prior to results being done. => I just ordered lipid panel, hepatic panel and CK.  Unfortunately, he did not get labs checked  Subjective  Discussed the use of AI scribe software for clinical note transcription with the patient,  who gave verbal consent to proceed. Very hard to keep on target but discussion.  Tends to wander colophony tangents.  Multiple different types of explanation for the same thing.  Prolonged visit.  History of Present Illness History of Present Illness Jon [name] is a 78 year old male with coronary artery disease and prior bypass surgery who  presents for follow-up after recent cardiac studies.  Approximately a year ago, he experienced a heart attack, which led to bypass surgery. Recent studies, including a stress PET and echocardiogram, were performed. The patient recalls being told that the stress PET scan showed a fixed defect in the mid to basal inferior wall. The echocardiogram, performed about a month later, showed a left ventricular ejection fraction (LVEF) of 50-55%.  He is currently taking aspirin  81 mg, Plavix  75 mg, atorvastatin  80 mg, Toprol  25 mg, moexipril, Synjardy  twice daily, Ozempic  injections, and Tresiba  insulin  40 units daily. He had previously reduced atorvastatin  to 40 mg due to concerns about nightmares but has since returned to 80 mg.  He reports a new symptom of right hand weakness and inability to hold objects, primarily occurring in the morning, which began approximately 45 days ago. He also mentions the return of optical migraines, which he experienced in his youth but had been absent for 25 years until recently.  He describes intermittent claudication symptoms, with equal discomfort in both legs when walking, which has improved over the past month. Previously, walking up steps would cause him to be out of breath, but this has also improved recently.  He quit smoking two years ago after 62 years of smoking. No chest pain, palpitations, or significant weight fluctuations. He experiences shortness of breath after climbing steps, which resolves after resting for a few minutes. No swelling in legs upon waking. Sleeps elevated for comfort, not due to breathing issues.  Cardiovascular ROS: positive for - dyspnea on exertion and much less prominent upper leg pain with exertion, no longer limiting. negative for - chest pain, edema, irregular heartbeat, orthopnea, palpitations, paroxysmal nocturnal dyspnea, rapid heart rate, shortness of breath, or lightheadedness or dizziness, syncope or near syncope, TIA or amaurosis  fugax, claudication  ROS:  Review of Systems - Negative except symptoms noted above-potential noncardiac symptomatic the right hand weakness issue.    Objective  Medications - Aspirin  81 mg daily; - Plavix  75 mg daily - Atorvastatin  80 mg daily - Toprol  XL 25 mg daily; - Synjardy  12.5-500 mg daily; - Semaglutide  injection weekly (1 mg; - Tresiba  20 units daily  Social History - Tobacco: Former smoker (quit two years ago), 62.0 years - Living Situation: Recently remarried, currently living in [place], [place] [facility] [place]   Studies Reviewed: Jon [name]        Stress PET Myocardial Perfusion Study: Severe reduced EF with global EF 24% and stress 23%-likely low due to gating parameters.  Small defect with moderate reduction in uptake in the mid to basal inferior wall that is fixed consistent with prior infarction.  There is also apical to basal anterior and anterolateral defect that is seen only in rest images consistent with infarct.  Due to the potential for low EF, felt to be intermediate risk.  Recommended echo to reassess EF.  (10/29/2023)  ECHO: Low normal LVEF of 50 to 55%.  Septal wall abnormality consistent with RBBB.  Inferior wall hypokinesis.  GR 1 DD.  Normal RV.  Calcified/functionally bicuspid AoV with non and left cusp fusion.  Moderate calcification/thickening-AV sclerosis without  stenosis.  (12/02/2023)  Right ABI 0.79, left ABI 0.8.  LDA arterial Dopplers of the suggested bilateral PFA 30 to 49%.  75 to 99% R SFA at 30 to 49% L SFA.  (11/29/2023)  CATH: 100% mid RCA treated Onyx frontier DES 2.5 mm 22 mm postdilated to 2.75 mm.  LM 75%.  pLAD 70%, mLAD 75%; OM1 75% (12/11/2022-inferior STEMI) Diagnostic  (Dominance: Right))       Intervent ion       Referred for CABGx3 (LIMA-LAD, SVG-PDA, SVG-OM1) in 01/30/2023,   Lab Results  Component Value Date   NA 140 10/16/2023   K 5.2 10/16/2023   CREATININE 1.53 (H) 10/16/2023   EGFR 47 (L) 10/16/2023   GLUCOSE 108 (H)  10/16/2023   Lab Results  Component Value Date   CHOL 109 12/11/2022   HDL 37 (L) 12/11/2022   LDLCALC 51 12/11/2022   TRIG 103 12/11/2022   CHOLHDL 2.9 12/11/2022     Risk Assessment/Calculations:         Physical Exam:   VS:  BP 108/66 (BP Location: Left Arm, Patient Position: Sitting, Cuff Size: Normal)   Pulse 72   Ht 6' 2 (1.88 m)   Wt 217 lb (98.4 kg)   SpO2 97%   BMI 27.86 kg/m    Wt Readings from Last 3 Encounters:  12/10/23 217 lb (98.4 kg)  11/24/23 218 lb 1.9 oz (98.9 kg)  10/22/23 219 lb 4 oz (99.5 kg)    GEN: Well nourished, well groomed in no acute distress; relatively healthy appearing.  Now has a pretty full beard.  Bulbous violaceous discoloration of nose.  Very tangential speech and circumstantial. NECK: No JVD; No carotid bruits CARDIAC: Normal S1, S2; RRR, no murmurs, rubs, gallops RESPIRATORY:  Clear to auscultation without rales, wheezing or rhonchi ; nonlabored, good air movement. ABDOMEN: Soft, non-tender, non-distended EXTREMITIES:  No C/C/C; No deformity      ASSESSMENT AND PLAN: .    Problem List Items Addressed This Visit       Cardiology Problems   Cerebrovascular disease - Primary (Chronic)   Coronary artery disease involving native coronary artery without angina pectoris (Chronic)   Coronary artery disease with prior myocardial infarction and CABG.  Stress PET showed fixed defect in mid to basal inferior wall due to prior myocardial infarction.  No ischemia Echocardiogram revealed LVEF 50-55%-inferior hypokinesis.  Results suggest that his bypass grafts functioning well, and the inferior hypokinesis is related to his STEMI.. No heart failure or significant arrhythmias. Currently not having any active anginal symptoms.  Does have some exertional dyspnea.  Cleared for rehabilitation/gym exercise..  - Resume rehabilitation activities, including swimming and weightlifting. - Order blood work for cholesterol and glucose control. -  Continue aspirin  81 mg daily and Plavix  75 mg daily -> can likely stop aspirin  1 year out from CABG.. - Continue atorvastatin  80 mg daily. - Continue Toprol  25 mg daily. - Continue Synjardy  & Ozempic  injections as prescribed. - Continue Tresiba  insulin  40 units daily.      Essential hypertension (Chronic)   Blood pressure well-controlled today on essentially only low-dose Toprol  25 mg daily.      Hyperlipidemia due to type 2 diabetes mellitus (HCC) (Chronic)   As of August 2024, LDL appeared to be well-controlled.  He is currently on atorvastatin  80 mg daily and is due for follow-up check. Type 2 diabetes mellitus managed with insulin  and oral agents. Glucose control to be assessed with blood work. - Check FLP  and CMP - Continue Synjardy , Ozempic , and Tresiba  insulin . - Continue atorvastatin  80 mg      PAD (peripheral artery disease) (HCC)   Peripheral artery disease with 70-90% blockage in left superficial femoral artery, right side less than 50% blockage. Symptoms equal in both legs, improved recently. - Refer to Dr. Franz for evaluation of peripheral artery disease and right arm symptoms. - Encourage regular walking to improve circulation.      Postural dizziness with near syncope (Chronic)   Much improved only on Toprol .  No longer on afterload reduction.      STEMI involving oth coronary artery of inferior wall (HCC) (Chronic)   Inferior STEMI in August 2024 with progression of RCA disease 200 and occlusion treated with DES stent.  Subsequently presented again with exertional chest pain and dyspnea/ACS and referred to CABG.  Inferior defect noted on stress PET and echo consistent with prior infarction.  Thankfully EF is stabilized.        Other   CKD stage 3 due to type 2 diabetes mellitus (HCC) (Chronic)   Lisinopril  held due to low blood pressures.  Continue on Toprol . Most recent creatinine in June was 1.53.  Due to recheck with lipids today.      Dyspnea on  exertion (Chronic)   Based on relatively normal results on my viewing echocardiogram I suspect that his exertional dyspnea is more related to deconditioning and COPD.      Presence of drug coated stent in right coronary artery (Chronic)   RCA for PCI performed in setting of inferior STEMI.  1 month later came in with recurrent chest pain and went for CABG. Remains on aspirin  and Plavix  for combination of PAD, carotid disease and CAD. At a minimum we will continue aspirin  and Plavix  1 year out from STEMI but would probably prefer to pushing until 1 year out from CABG at which time we can stop aspirin  and continue Plavix  monotherapy.      S/P CABG x 3   Apparently patent grafts based on lack of ischemia noted on stress test. Not quite 1 year out.  After completing 1 year of DAPT post CABG, would stop aspirin  and continue Plavix  monotherapy.      Other Visit Diagnoses       Medication management       Relevant Orders   Lipid panel   Comprehensive metabolic panel with GFR              Follow-Up: Return in about 6 months (around 06/11/2024) for Alternate 6 month follow-up with APP & MD, [facility] [address].   I spent 72 minutes in the care of Jon [name] today including reviewing labs (1 minute), reviewing studies (9 minutes reviewing stress PET, echo and LEA Doppler/ABIs), face to face time discussing treatment options (44), reviewing records from recent visits with myself and APP (5 minutes), 13 minutes dictating, and documenting in the encounter.     Signed, Alm MICAEL Clay, MD, MS Alm Clay, M.D., M.S. Interventional Chartered certified accountant  Pager # [phone]

## 2023-12-10 NOTE — Assessment & Plan Note (Signed)
 Peripheral artery disease with 70-90% blockage in left superficial femoral artery, right side less than 50% blockage. Symptoms equal in both legs, improved recently. - Refer to Dr. Franz for evaluation of peripheral artery disease and right arm symptoms. - Encourage regular walking to improve circulation.

## 2023-12-10 NOTE — Assessment & Plan Note (Signed)
 Based on relatively normal results on my viewing echocardiogram I suspect that his exertional dyspnea is more related to deconditioning and COPD.

## 2023-12-10 NOTE — Assessment & Plan Note (Signed)
 Apparently patent grafts based on lack of ischemia noted on stress test. Not quite 1 year out.  After completing 1 year of DAPT post CABG, would stop aspirin  and continue Plavix  monotherapy.

## 2023-12-10 NOTE — Assessment & Plan Note (Signed)
 Much improved only on Toprol .  No longer on afterload reduction.

## 2023-12-10 NOTE — Assessment & Plan Note (Signed)
 As of August 2024, LDL appeared to be well-controlled.  He is currently on atorvastatin  80 mg daily and is due for follow-up check. Type 2 diabetes mellitus managed with insulin  and oral agents. Glucose control to be assessed with blood work. - Check FLP and CMP - Continue Synjardy , Ozempic , and Tresiba  insulin . - Continue atorvastatin  80 mg

## 2023-12-10 NOTE — Assessment & Plan Note (Signed)
 RCA for PCI performed in setting of inferior STEMI.  1 month later came in with recurrent chest pain and went for CABG. Remains on aspirin  and Plavix  for combination of PAD, carotid disease and CAD. At a minimum we will continue aspirin  and Plavix  1 year out from STEMI but would probably prefer to pushing until 1 year out from CABG at which time we can stop aspirin  and continue Plavix  monotherapy.

## 2023-12-10 NOTE — Patient Instructions (Addendum)
 Medication Instructions:  Your physician recommends that you continue on your current medications as directed. Please refer to the Current Medication list given to you today.   *If you need a refill on your cardiac medications before your next appointment, please call your pharmacy*  Lab Work: Your provider would like for you to have following labs drawn today CMP, Lipid Panel.   If you have labs (blood work) drawn today and your tests are completely normal, you will receive your results only by: MyChart Message (if you have MyChart) OR A paper copy in the mail If you have any lab test that is abnormal or we need to change your treatment, we will call you to review the results.  Testing/Procedures: None ordered at this time   Follow-Up: At [facility], you and your health needs are our priority.  As part of our continuing mission to provide you with exceptional heart care, our providers are all part of one team.  This team includes your primary Cardiologist (physician) and Advanced Practice Providers or APPs (Physician Assistants and Nurse Practitioners) who all work together to provide you with the care you need, when you need it.  Your next appointment:   6 month(s)  Provider:   Katlyn West, NP. Then, Alm Clay, MD will plan to see you again in 1 year(s).    We recommend signing up for the patient portal called MyChart.  Sign up information is provided on this After Visit Summary.  MyChart is used to connect with patients for Virtual Visits (Telemedicine).  Patients are able to view lab/test results, encounter notes, upcoming appointments, etc.  Non-urgent messages can be sent to your provider as well.   To learn more about what you can do with MyChart, go to [email or web address].

## 2023-12-10 NOTE — Assessment & Plan Note (Signed)
 Lisinopril  held due to low blood pressures.  Continue on Toprol . Most recent creatinine in June was 1.53.  Due to recheck with lipids today.

## 2023-12-10 NOTE — Assessment & Plan Note (Signed)
 Blood pressure well-controlled today on essentially only low-dose Toprol  25 mg daily.

## 2023-12-10 NOTE — Assessment & Plan Note (Signed)
 Inferior STEMI in August 2024 with progression of RCA disease 200 and occlusion treated with DES stent.  Subsequently presented again with exertional chest pain and dyspnea/ACS and referred to CABG.  Inferior defect noted on stress PET and echo consistent with prior infarction.  Thankfully EF is stabilized.

## 2023-12-10 NOTE — Assessment & Plan Note (Signed)
 Coronary artery disease with prior myocardial infarction and CABG.  Stress PET showed fixed defect in mid to basal inferior wall due to prior myocardial infarction.  No ischemia Echocardiogram revealed LVEF 50-55%-inferior hypokinesis.  Results suggest that his bypass grafts functioning well, and the inferior hypokinesis is related to his STEMI.. No heart failure or significant arrhythmias. Currently not having any active anginal symptoms.  Does have some exertional dyspnea.  Cleared for rehabilitation/gym exercise..  - Resume rehabilitation activities, including swimming and weightlifting. - Order blood work for cholesterol and glucose control. - Continue aspirin  81 mg daily and Plavix  75 mg daily -> can likely stop aspirin  1 year out from CABG.. - Continue atorvastatin  80 mg daily. - Continue Toprol  25 mg daily. - Continue Synjardy  & Ozempic  injections as prescribed. - Continue Tresiba  insulin  40 units daily.

## 2023-12-11 DIAGNOSIS — Z79899 Other long term (current) drug therapy: Secondary | ICD-10-CM | POA: Diagnosis not present

## 2023-12-12 ENCOUNTER — Ambulatory Visit: Payer: Self-pay | Admitting: Cardiology

## 2023-12-12 LAB — COMPREHENSIVE METABOLIC PANEL WITH GFR
ALT: 23 IU/L (ref 0–44)
AST: 22 IU/L (ref 0–40)
Albumin: 3.9 g/dL (ref 3.8–4.8)
Alkaline Phosphatase: 82 IU/L (ref 44–121)
BUN/Creatinine Ratio: 7 — ABNORMAL LOW (ref 10–24)
BUN: 9 mg/dL (ref 8–27)
Bilirubin Total: 0.3 mg/dL (ref 0.0–1.2)
CO2: 21 mmol/L (ref 20–29)
Calcium: 8.6 mg/dL (ref 8.6–10.2)
Chloride: 106 mmol/L (ref 96–106)
Creatinine, Ser: 1.36 mg/dL — ABNORMAL HIGH (ref 0.76–1.27)
Globulin, Total: 2.3 g/dL (ref 1.5–4.5)
Glucose: 97 mg/dL (ref 70–99)
Potassium: 4.5 mmol/L (ref 3.5–5.2)
Sodium: 142 mmol/L (ref 134–144)
Total Protein: 6.2 g/dL (ref 6.0–8.5)
eGFR: 54 mL/min/1.73 — ABNORMAL LOW (ref >59)

## 2023-12-12 LAB — LIPID PANEL
Chol/HDL Ratio: 2.7 ratio (ref 0.0–5.0)
Cholesterol, Total: 118 mg/dL (ref 100–199)
HDL: 44 mg/dL (ref >39)
LDL Chol Calc (NIH): 56 mg/dL (ref 0–99)
Triglycerides: 94 mg/dL (ref 0–149)
VLDL Cholesterol Cal: 18 mg/dL (ref 5–40)

## 2023-12-17 ENCOUNTER — Ambulatory Visit: Admitting: Cardiology

## 2023-12-17 DIAGNOSIS — E119 Type 2 diabetes mellitus without complications: Secondary | ICD-10-CM | POA: Diagnosis not present

## 2023-12-17 DIAGNOSIS — H40013 Open angle with borderline findings, low risk, bilateral: Secondary | ICD-10-CM | POA: Diagnosis not present

## 2023-12-17 DIAGNOSIS — H2513 Age-related nuclear cataract, bilateral: Secondary | ICD-10-CM | POA: Diagnosis not present

## 2023-12-30 ENCOUNTER — Other Ambulatory Visit: Payer: Self-pay | Admitting: Family Medicine

## 2023-12-31 ENCOUNTER — Telehealth: Payer: Self-pay

## 2023-12-31 ENCOUNTER — Other Ambulatory Visit (INDEPENDENT_AMBULATORY_CARE_PROVIDER_SITE_OTHER)

## 2023-12-31 ENCOUNTER — Ambulatory Visit: Payer: Self-pay | Admitting: Gastroenterology

## 2023-12-31 ENCOUNTER — Encounter: Payer: Self-pay | Admitting: Gastroenterology

## 2023-12-31 ENCOUNTER — Other Ambulatory Visit

## 2023-12-31 ENCOUNTER — Ambulatory Visit: Admitting: Gastroenterology

## 2023-12-31 ENCOUNTER — Ambulatory Visit (INDEPENDENT_AMBULATORY_CARE_PROVIDER_SITE_OTHER)
Admission: RE | Admit: 2023-12-31 | Discharge: 2023-12-31 | Disposition: A | Discharge status: Home / Self Care | Source: Ambulatory Visit | Attending: Gastroenterology | Admitting: Gastroenterology

## 2023-12-31 VITALS — HR 76 | Ht 73.0 in | Wt 214.0 lb

## 2023-12-31 DIAGNOSIS — K5900 Constipation, unspecified: Secondary | ICD-10-CM

## 2023-12-31 DIAGNOSIS — R140 Abdominal distension (gaseous): Secondary | ICD-10-CM | POA: Diagnosis not present

## 2023-12-31 DIAGNOSIS — Z7901 Long term (current) use of anticoagulants: Secondary | ICD-10-CM | POA: Diagnosis not present

## 2023-12-31 DIAGNOSIS — Z860100 Personal history of colon polyps, unspecified: Secondary | ICD-10-CM | POA: Diagnosis not present

## 2023-12-31 DIAGNOSIS — Q438 Other specified congenital malformations of intestine: Secondary | ICD-10-CM | POA: Diagnosis not present

## 2023-12-31 LAB — TSH: TSH: 10.75 u[IU]/mL — ABNORMAL HIGH (ref 0.35–5.50)

## 2023-12-31 NOTE — Patient Instructions (Addendum)
 Your provider has requested that you have an abdominal x ray before leaving today. Please go to the basement floor to our Radiology department for the test.   Your provider has requested that you go to the basement level for lab work before leaving today. Press B on the elevator. The lab is located at the first door on the left as you exit the elevator.   Take 2 fleets enemas.   Start Miralax  1 capful daily in 8 ounces of liquid.  _______________________________________________________  If your blood pressure at your visit was 140/90 or greater, please contact your primary care physician to follow up on this.  _______________________________________________________  If you are age 77 or older, your body mass index should be between 23-30. Your Body mass index is 28.23 kg/m. If this is out of the aforementioned range listed, please consider follow up with your Primary Care Provider.  If you are age 17 or younger, your body mass index should be between 19-25. Your Body mass index is 28.23 kg/m. If this is out of the aformentioned range listed, please consider follow up with your Primary Care Provider.   ________________________________________________________  The [facility] providers would like to encourage you to use MYCHART to communicate with providers for non-urgent requests or questions.  Due to long hold times on the telephone, sending your provider a message by [facility] may be a faster and more efficient way to get a response.  Please allow 48 business hours for a response.  Please remember that this is for non-urgent requests.  _______________________________________________________  [facility] is using a team-based approach to care.  Your team is made up of your doctor and two to three APPS. Our APPS (Nurse Practitioners and Physician Assistants) work with your physician to ensure care continuity for you. They are fully qualified to address your health concerns and  develop a treatment plan. They communicate directly with your gastroenterologist to care for you. Seeing the Advanced Practice Practitioners on your physician's team can help you by facilitating care more promptly, often allowing for earlier appointments, access to diagnostic testing, procedures, and other specialty referrals.

## 2023-12-31 NOTE — Progress Notes (Signed)
 Jon [name] [id number] 09-23-1945   Chief Complaint: Discuss colonoscopy  Referring Provider: Alvan Dorothyann BIRCH, * Primary GI MD: Sampson (previous Dr. Debrah)  HPI: Jon [name] is a 78 y.o. male with past medical history of CVA and TIA, CAD with angina, insulin -dependent T2DM, MI 12/2022 s/p CABG x3, HTN, CKD stage 3, HLD, subclavian steal syndrome of left subclavian artery s/p left subclavian stent 2023 who presents today to discuss colonoscopy.  Seen by cardiology 12/10/2023 with complaint of 2 to 3 months of worsening exertional dyspnea similar to pre-CABG. Per note thought to be due to deconditioning and COPD.   Patient here today with his wife.  States he has had struggles with constipation intermittently over the last 5 years.  States that he sometimes will feel the urge to have a bowel movement, but go to the bathroom and be unable to pass any stool.  This first happened about 5 years ago and he had intermittent problems with constipation.  Then had a couple years of normal bowel movements.  Recently has been having more problems with constipation again.  States that he can go 10 or more days without a bowel movement and intermittently will manually disimpact himself.  Also has used Dulcolax suppositories as needed, but states that when he uses these it will still be a few days until he has a bowel movement.  He denies any rectal bleeding, blood in his stool, melena.  Denies any diarrhea.  Denies abdominal pain.  States that he and his wife eat out a lot.  He does not exercise.  He is also on Ozempic .  Is not taking anything on a regular basis for constipation.  Denies use of MiraLAX  or stool softeners.  History somewhat challenging today as patient goes off on tangents and most of visit was spent discussing his 5-year history of intermittent constipation and cardiac history.  He is actually unsure if he wants to go through a colonoscopy because of his cardiac  history, having had an MI and open heart surgery about a year ago.  He has recently had dyspnea on exertion which he states is because he is out of shape.  We did discuss options for colonoscopy.  Discussed his history of adenomatous polyps and possibility of missing a polyp or cancer by not doing procedure.  Discussed that we would reach out to his cardiologist to obtain clearance and permission to hold his blood thinner.  Also discussed likelihood of procedure being done in hospital setting due to his increased sedation risks.  His main concern at this time is addressing his constipation and he would like to take more time to think about doing colonoscopy.  Previous GI Procedures/Imaging   Colonoscopy 07/30/2011 - 3 mm sessile polyp in the ascending colon - 3 mm sessile polyp in the descending colon - Internal hemorrhoids - Otherwise normal - Recall 5 years Path: 1. Surgical [P], ascending colon polyp - SESSILE SERRATED ADENOMA (X1). 2. Surgical [P], descending colon polyp - HYPERPLASTIC POLYP (X1)   Past Medical History:  Diagnosis Date   BENIGN POSITIONAL VERTIGO 05/20/2010   Qualifier: Diagnosis of  By: Alvan MD, Catherine     Centrilobular emphysema [facility])    Cerebrovascular disease 07/15/2018   Prior CVA & TIA. Carotid CTA ~50% Bilateral ICA,  Multifocal severe stenosis of the left V2 vertebral artery.  Right vertebral artery patent without significant stenosis.   Coronary artery disease involving native coronary artery with angina pectoris (HCC) 11/18/2017  Severe LM, LAD & RCA stenosis indicated on Coronary CTA => CaCardiac Cath 01/02/2022: dLM ~50%; mLAD 85%@D1  90% (1,1,1) - m-dLAD 90$ @ 65% D2 (0,1,1), RI 75%, pLCx 30%-OM1 60%, pRCA 25% - p-mRCA hazy 85%. Normal LVEF by Echo, EDP 16 mmHg. => CABG x 3 (LIMA-LAD, SVG-OM1, SVG-PDA) 01/2023   Current every day smoker    No interest in quitting   Diabetes mellitus, type II, insulin  dependent (HCC)    With peripheral neuropathy,  peripheral vascular disease and coronary artery disease as complications.   Epididymitis    History of acute myocardial infarction of inferior wall 12/2022   Progression of mid RCA disease to 100%-DES PCI with Onyx Frontier DES   Hx of CABG 01/30/2023   Dr. Lucas: LIMA-LAD, SVG-OM1, SVG-PDA   Hyperlipemia    Hypertension    Stroke (HCC) 11/2009   Subclavian steal syndrome of left subclavian artery 11/19/2021   Status post left subclavian stent   TIA (transient ischemic attack) 11/11/2021   Brief episode of left sided arm pain and slurred speech    Past Surgical History:  Procedure Laterality Date   CORONARY ARTERY BYPASS GRAFT N/A 01/30/2023   Procedure: CORONARY ARTERY BYPASS GRAFTING TIMES THREE USING LEFT INTERNAL MAMMARY ARTERY AND ENDOHARVEST OF RIGHT LEG GREATER SAPHENOUS VEIN;  Surgeon: Lucas Dorise POUR, MD;  Location: [facility] OR;  Service: Open Heart Surgery;; LIMA-LAD, SVG-OM1, SVG-PDA   CORONARY/GRAFT ACUTE MI REVASCULARIZATION N/A 12/11/2022   Procedure: Coronary/Graft Acute MI Revascularization;  Surgeon: Ammon Blunt, MD;  Location: [facility] INVASIVE CV LAB;  Service: Cardiovascular;;  Inf STEMI [facility]): prox RCA 100% (Onyx Frontier DES 3.5 mm 22 mm)   LEFT HEART CATH AND CORONARY ANGIOGRAPHY N/A 01/02/2022   Procedure: LEFT HEART CATH AND CORONARY ANGIOGRAPHY;  Surgeon: Anner Alm ORN, MD;  Location: [facility] INVASIVE CV LAB:: dLM ~50%; mLAD 85%@D1  90% (1,1,1) - m-dLAD 90$ @ 65% D2 (0,1,1), RI 75%, pLCx 30%-OM1 60%, pRCA 25% - p-mRCA hazy 85%. Normal LVEF by Echo, EDP 16 mmHg. = Best option CABG   LEFT HEART CATH AND CORONARY ANGIOGRAPHY N/A 12/11/2022   Procedure: LEFT HEART CATH AND CORONARY ANGIOGRAPHY;  Surgeon: Ammon Blunt, MD;  Location: [facility] INVASIVE CV LAB;  Service: Cardiovascular;; Inf STEMI: prox RCA 100% (Onyx Frontier DES 3.5 mm 22 mm); mid-dist LM 75%, prox LAD ~70% & mid~75% , 75% OM1.   PILONIDAL CYST EXCISION  1960, 61, 62, 63   TEE WITHOUT CARDIOVERSION  N/A 01/30/2023   Procedure: TRANSESOPHAGEAL ECHOCARDIOGRAM;  Surgeon: Lucas Dorise POUR, MD;  Location: [facility] OR;  Service: Open Heart Surgery;  Laterality: N/A;   TRANSTHORACIC ECHOCARDIOGRAM  11/28/2021   EF 55 to 60%.  No RWMA.  Moderate asymmetric LVH-basal septal.  GR 1 DD.  Normal RV.  Normal MV.  AOV sclerosis without stenosis.  Normal RAP.   TRANSTHORACIC ECHOCARDIOGRAM  12/11/2022   In the setting of Inferior STEMI: EF 50 to 55% with mid to distal inferior wall HK.  GR 1 DD.  Mild MR.  Otherwise normal   UPPER EXTREMITY ANGIOGRAPHY Left 12/17/2021   Procedure: Upper Extremity Angiography;  Surgeon: Jama Cordella MATSU, MD;  Location: [facility] INVASIVE CV LAB;  Service: Cardiovascular:: High-grade stenosis just distal to the origin of the L-Subclavian A, normal Innom A & L Carotid A => Ost-Prox L SubCl A [id number] Stent - dilated with 9 mm balloon.   VASECTOMY     Zio Patch Monitor  11/2021   Predom Rhtyhm: SR - HR range 47-98 bpm,  AVG 64 bpm.  3 runs of NS VT  (> 4 PVC beats): Fastest: 5 beats - HR 125-162 bpm, ~ 145 bpm, 2.1 sec; Longest with Fastest Avg-12 beats, 150-158 bpm, ~ 155 bpm, 4.6 sec.  1  5 beat Atrial Run: HR 125, Avg 120 bpm,   No Sustained Arrhythmias,   Occasional isolated PACs (2.1%) & Rare isolated PVCs (<1%).  No patient triggered events.    Current Outpatient Medications  Medication Sig Dispense Refill   Accu-Chek Softclix Lancets lancets Use to check blood sugars twice daily 300 each 3   aspirin  EC 81 MG tablet Take 1 tablet (81 mg total) by mouth daily. Swallow whole.     atorvastatin  (LIPITOR ) 40 MG tablet Take 1 tablet (40 mg total) by mouth daily. 90 tablet 3   Blood Glucose Monitoring Suppl (ACCU-CHEK GUIDE ME) w/Device KIT Check glucose three times daily. Dx. Code E11.8 1 kit 3   clopidogrel  (PLAVIX ) 75 MG tablet Take 1 tablet (75 mg total) by mouth daily. 90 tablet 3   glucose blood (ACCU-CHEK GUIDE TEST) test strip Check glucose three times daily. Dx. Code E11.8  100 each PRN   glucose blood (ACCU-CHEK GUIDE) test strip Dx DM E11.9. Check fasting blood sugar every morning. 300 each PRN   insulin  degludec (TRESIBA  FLEXTOUCH) 100 UNIT/ML FlexTouch Pen Inject 20 Units into the skin daily. 1 mL 0   metoprolol  succinate (TOPROL -XL) 25 MG 24 hr tablet Take 1 tablet (25 mg total) by mouth daily. 90 tablet 1   Semaglutide , 1 MG/DOSE, 4 MG/3ML SOPN Inject 1 mg into the skin once a week. 9 mL 1   SYNJARDY  12.5-500 MG TABS TAKE 1 TABLET BY MOUTH TWICE  DAILY 180 tablet 3   No current facility-administered medications for this visit.    Allergies as of 12/31/2023 - Review Complete 12/31/2023  Allergen Reaction Noted   Gabapentin Other (See Comments) 07/04/2014   Lisinopril  Other (See Comments) 10/01/2018   Metformin  and related Diarrhea 01/27/2017    Family History  Problem Relation Age of Onset   Diabetes Other        family hx of   Colon cancer Neg Hx     Social History   Tobacco Use   Smoking status: Former    Current packs/day: 0.00    Average packs/day: 1 pack/day for 62.0 years (62.0 ttl pk-yrs)    Types: Cigarettes    Start date: 01/13/1960    Quit date: 01/12/2022    Years since quitting: 1.9   Smokeless tobacco: Never  Vaping Use   Vaping status: Never Used  Substance Use Topics   Alcohol use: No   Drug use: No     Review of Systems:    Constitutional: No unexplained weight loss, fever, chills Cardiovascular: No chest pain Respiratory: Shortness of breath with exertion Gastrointestinal: See HPI and otherwise negative Hematologic: No bleeding     Physical Exam:  Vital signs: Pulse 76   Ht 6' 1 (1.854 m)   Wt 214 lb (97.1 kg)   BMI 28.23 kg/m   Constitutional: NAD, overweight, alert and cooperative Head:  Normocephalic and atraumatic.  Eyes: No scleral icterus.  Respiratory: Respirations even and unlabored. Lungs clear to auscultation bilaterally.  No wheezes, crackles, or rhonchi.  Cardiovascular:  Regular rate and  rhythm. No murmurs. No peripheral edema. Gastrointestinal:  Soft, nondistended, nontender. No rebound or guarding. Normal bowel sounds. No appreciable masses or hepatomegaly. Rectal:  Not performed.  Neurologic:  Alert and  oriented x4;  grossly normal neurologically.  Skin:   Dry and intact without significant lesions or rashes. Psychiatric: Oriented to person, place and time. Demonstrates good judgement and reason without abnormal affect or behaviors.   RELEVANT LABS AND IMAGING: CBC    Component Value Date/Time   WBC 4.6 10/16/2023 1142   WBC 5.8 02/05/2023 0321   RBC 4.14 10/16/2023 1142   RBC 2.60 (L) 02/05/2023 0321   HGB 13.8 10/16/2023 1142   HCT 40.5 10/16/2023 1142   PLT 169 10/16/2023 1142   MCV 98 (H) 10/16/2023 1142   MCH 33.3 (H) 10/16/2023 1142   MCH 33.1 02/05/2023 0321   MCHC 34.1 10/16/2023 1142   MCHC 33.5 02/05/2023 0321   RDW 11.8 10/16/2023 1142   LYMPHSABS 1.9 01/25/2023 1754   LYMPHSABS 1.5 02/12/2022 0941   MONOABS 0.4 01/25/2023 1754   EOSABS 0.2 01/25/2023 1754   EOSABS 0.2 02/12/2022 0941   BASOSABS 0.0 01/25/2023 1754   BASOSABS 0.0 02/12/2022 0941    CMP     Component Value Date/Time   NA 142 12/11/2023 0811   K 4.5 12/11/2023 0811   CL 106 12/11/2023 0811   CO2 21 12/11/2023 0811   GLUCOSE 97 12/11/2023 0811   GLUCOSE 177 (H) 02/09/2023 0404   BUN 9 12/11/2023 0811   CREATININE 1.36 (H) 12/11/2023 0811   CREATININE 1.38 (H) 03/20/2021 1122   CALCIUM  8.6 12/11/2023 0811   PROT 6.2 12/11/2023 0811   ALBUMIN  3.9 12/11/2023 0811   AST 22 12/11/2023 0811   ALT 23 12/11/2023 0811   ALKPHOS 82 12/11/2023 0811   BILITOT 0.3 12/11/2023 0811   GFRNONAA 40 (L) 02/09/2023 0404   GFRNONAA 51 (L) 09/07/2019 0033   GFRAA 55 (L) 12/11/2019 1321   GFRAA 59 (L) 09/07/2019 0033   Echocardiogram 12/02/2023 1. Abnormal septal motion and latera wall dysychrony ? from BBB Inferior wall hypokinesis EF appears 50- 55% despite normal strain and 3D  calculation . Left ventricular ejection fraction, by estimation, is 50 to 55% . The left ventricle has low normal function. The left ventricle has no regional wall motion abnormalities. There is moderate asymmetric left ventricular hypertrophy of the basal and septal segments. Left ventricular diastolic parameters are consistent with Grade I diastolic dysfunction ( impaired relaxation) . The average left ventricular global longitudinal strain is - 20. 9 % . The global longitudinal strain is normal. 2. Right ventricular systolic function is normal. The right ventricular size is normal.  3. The mitral valve is abnormal. Trivial mitral valve regurgitation. No evidence of mitral stenosis.  4. Calcified and funcitonally bicuspid with fused non and left cusp. The aortic valve is abnormal. There is moderate calcification of the aortic valve. There is moderate thickening of the aortic valve. Aortic valve regurgitation is not visualized. Aortic valve sclerosis/ calcification is present, without any evidence of aortic stenosis.  5. Aortic dilatation noted. There is mild dilatation of the ascending aorta, measuring 39 mm.  6. The inferior vena cava is normal in size with greater than 50% respiratory variability, suggesting right atrial pressure of 3 mmHg.  Assessment/Plan:   Constipation History of colon polyps Chronic anticoagulation Patient seen today to discuss colonoscopy, however most of the visit was spent discussing his problems with constipation.  Has infrequent bowel movements with hard stools and has disimpacted himself on occasion.  Using Dulcolax suppositories intermittently to produce a bowel movement but not on any regular regimen for constipation.  This is his main concern today  and he is actually unsure whether or not he wants to proceed with a colonoscopy based on his cardiac history.  We did discuss options for colonoscopy.  Discussed his history of adenomatous polyps and possibility of missing a  polyp or cancer by not doing procedure.  Discussed that we would reach out to his cardiologist to obtain clearance and permission to hold his blood thinner.  Also discussed likelihood of procedure being done in hospital setting due to his increased sedation risks.  His main concern at this time is addressing his constipation and he would like to take more time to think about doing colonoscopy.  He is not having any abdominal pain, rectal pain, or rectal bleeding.  - Recommend patient use OTC fleets enemas followed by regular regimen of MiraLAX  1 capful daily.  Can adjust dose as needed.  Ultimately may need prescription strength treatment. - Will obtain abdominal x-ray and if large stool burden and no evidence of obstruction can have him complete a MiraLAX  purge. - Labs today: TSH, TTG, IgA - Will give patient information regarding colonoscopy procedure - Follow-up in 3 months to revisit colonoscopy.  Will go ahead and request clearance for procedure/blood thinner hold in the event that he decides to proceed.   Camie Furbish, PA-C [facility] 12/31/2023, 11:34 AM  Patient Care Team: Alvan Dorothyann BIRCH, MD as PCP - General (Family Medicine) Anner Alm ORN, MD as PCP - Cardiology (Cardiology) Beverli Cara PARAS, [facility] (Inactive) as Pharmacist (Pharmacist) Gevena Deward ORN, OD as Referring Physician (Optometry) Alvan Dorothyann BIRCH, MD as Consulting Physician (Family Medicine)

## 2023-12-31 NOTE — Telephone Encounter (Signed)
 [facility] Pre-operative Risk Assessment     Request for surgical clearance:     Endoscopy Procedure  What type of surgery is being performed?     Colonoscopy   When is this surgery scheduled?     TBD  What type of clearance is required ?   Pharmacy AND cardiac  Are there any medications that need to be held prior to surgery and how long? Plavix  5 days and heart attack within the past year, h/o TIA, h/o cardiovascular disorder.   Practice name and name of physician performing surgery?      [facility]  What is your office phone and fax number?      Phone- [phone]  Fax- [phone]  Anesthesia type (None, local, MAC, general) ?       MAC   Please route your response to Corean [place], [facility]

## 2023-12-31 NOTE — Progress Notes (Signed)
 SABRA

## 2024-01-01 ENCOUNTER — Telehealth: Payer: Self-pay | Admitting: Family Medicine

## 2024-01-01 DIAGNOSIS — E039 Hypothyroidism, unspecified: Secondary | ICD-10-CM

## 2024-01-01 MED ORDER — LEVOTHYROXINE SODIUM 50 MCG PO TABS: 50.0000 ug | ORAL_TABLET | Freq: Every day | ORAL | 0 refills | Status: DC

## 2024-01-01 NOTE — Telephone Encounter (Signed)
-----   Message from Nurse Almarie HERO sent at 01/01/2024 10:44 AM EDT ----- For your review

## 2024-01-01 NOTE — Telephone Encounter (Signed)
 Dr. Anner  You saw this patient on 12/10/2023. Per protocol we request that you comment on his cardiac risk to proceed with colonoscopy on date to be determined, and also holding Plavix  and ASA. I read your note in which you said that patient should stay on DAPT for one year and then Plavix  monotherapy thereafter, due to hx of CABG after inferior STEMI, PAD, and Carotid disease.  Since it has been less than 2 months since evaluated in the office please make your recommendations on this issue. Please send your comment to P CV Pre-Op Pool.  Thank you, Lamarr Satterfield DNP, ANP, AACC.

## 2024-01-01 NOTE — Telephone Encounter (Signed)
   Patient Name: Jon [name]  DOB: 1945/07/27 MRN: [id number]  Primary Cardiologist: Alm Clay, MD  Chart reviewed as part of pre-operative protocol coverage. Given past medical history and time since last visit, based on ACC/AHA guidelines, Shahir Karen is at acceptable risk for the planned procedure without further cardiovascular testing.   Per Dr. Clay 01/01/2024 So in general, there is no reason for any preop assessment for colonoscopy.  There is no risk more so than a TEE. His CABG was in September 2024.  His STEMI was before that.  My note pretty much spells out timing of antiplatelet agents.  I think he has got enough vascular disease at getting him through a year from his STEMI at a minimum is warranted. We just did an echocardiogram and a stress PET so I would not do a thing else.  They are both showed no ischemia and an EF was better on echo with an PET. In the absence of active angina or heart failure, no further cardiac evaluation is required.     Mr. Thwaites perioperative risk of a major cardiac event is 1.1% according to the Revised Cardiac Risk Index (RCRI).  Therefore, he is at low risk for perioperative complications since he just had a nonischemic stress test.   His functional capacity is fair at ~4-6 METs according to the Duke Activity Status Index (DASI). Recommendations: According to ACC/AHA guidelines, no further cardiovascular testing needed.  The patient may proceed to surgery at acceptable risk.   Antiplatelet and/or Anticoagulation Recommendations: As above September 2025 If acceptable risk, would continue aspirin  for the procedure.  But can be held 5 to 7 days preop As of September 2025, okay to hold Plavix  5 to 7 days preop for procedure   Alm Clay, MD  The patient was advised that if he develops new symptoms prior to surgery to contact our office to arrange for a follow-up visit, and he verbalized understanding.  I will route this  recommendation to the requesting party via Epic fax function and remove from pre-op pool.  Please call with questions.  Lamarr Satterfield, NP 01/01/2024, 3:05 PM

## 2024-01-01 NOTE — Telephone Encounter (Signed)
 Call pt: thyroid  is off and is consistant with hypothyroidism.  We need to start a thyroid  medication at and then recheck TSH and free T4.  I will send to [facility]   Meds ordered this encounter  Medications   levothyroxine  (SYNTHROID ) 50 MCG tablet    Sig: Take 1 tablet (50 mcg total) by mouth daily before breakfast. 1 hour before meal.    Dispense:  90 tablet    Refill:  0

## 2024-01-02 LAB — TISSUE TRANSGLUTAMINASE, IGA: (tTG) Ab, IgA: <1 U/mL

## 2024-01-02 LAB — IGA: Immunoglobulin A: 208 mg/dL (ref 70–320)

## 2024-01-04 ENCOUNTER — Telehealth: Payer: Self-pay

## 2024-01-04 MED ORDER — KETOCONAZOLE 2 % EX CREA: TOPICAL_CREAM | Freq: Every day | CUTANEOUS | 3 refills | Status: AC

## 2024-01-04 NOTE — Telephone Encounter (Signed)
 Copied from CRM [id number]. Topic: Clinical - Medical Advice >> Jan 04, 2024 12:02 PM Mercer PEDLAR wrote: Reason for CRM: Patient would like Jon to give him a callback regarding thyroid  levels.

## 2024-01-04 NOTE — Telephone Encounter (Signed)
Jon [name] spoke with patient

## 2024-01-04 NOTE — Telephone Encounter (Signed)
 Patient advised of recommendations. He would like a refill on ketoconazole . Pended prescription. It was not on his current medication list.

## 2024-01-04 NOTE — Telephone Encounter (Signed)
 Meds ordered this encounter  Medications   levothyroxine  (SYNTHROID ) 50 MCG tablet    Sig: Take 1 tablet (50 mcg total) by mouth daily before breakfast. 1 hour before meal.    Dispense:  90 tablet    Refill:  0   ketoconazole  (NIZORAL ) 2 % cream    Sig: Apply topically daily.    Dispense:  60 g    Refill:  3

## 2024-01-05 ENCOUNTER — Encounter: Payer: Self-pay | Admitting: Speech Pathology

## 2024-01-05 ENCOUNTER — Ambulatory Visit: Attending: Otolaryngology | Admitting: Speech Pathology

## 2024-01-05 DIAGNOSIS — J383 Other diseases of vocal cords: Secondary | ICD-10-CM | POA: Insufficient documentation

## 2024-01-05 DIAGNOSIS — R490 Dysphonia: Secondary | ICD-10-CM | POA: Insufficient documentation

## 2024-01-05 NOTE — Therapy (Signed)
 OUTPATIENT SPEECH LANGUAGE PATHOLOGY VOICE EVALUATION   Patient Name: Jon [name] MRN: [id number] DOB:June 23, 1945, 78 y.o., male Today's Date: 01/05/2024  PCP: Dr. Alvan REFERRING PROVIDER: Dr. Okey  END OF SESSION:   Past Medical History:  Diagnosis Date   BENIGN POSITIONAL VERTIGO 05/20/2010   Qualifier: Diagnosis of  By: Alvan MD, Dorothyann Burkes emphysema [facility])    Cerebrovascular disease 07/15/2018   Prior CVA & TIA. Carotid CTA ~50% Bilateral ICA,  Multifocal severe stenosis of the left V2 vertebral artery.  Right vertebral artery patent without significant stenosis.   Coronary artery disease involving native coronary artery with angina pectoris (HCC) 11/18/2017   Severe LM, LAD & RCA stenosis indicated on Coronary CTA => CaCardiac Cath 01/02/2022: dLM ~50%; mLAD 85%@D1  90% (1,1,1) - m-dLAD 90$ @ 65% D2 (0,1,1), RI 75%, pLCx 30%-OM1 60%, pRCA 25% - p-mRCA hazy 85%. Normal LVEF by Echo, EDP 16 mmHg. => CABG x 3 (LIMA-LAD, SVG-OM1, SVG-PDA) 01/2023   Current every day smoker    No interest in quitting   Diabetes mellitus, type II, insulin  dependent (HCC)    With peripheral neuropathy, peripheral vascular disease and coronary artery disease as complications.   Epididymitis    History of acute myocardial infarction of inferior wall 12/2022   Progression of mid RCA disease to 100%-DES PCI with Onyx Frontier DES   Hx of CABG 01/30/2023   Dr. Lucas: LIMA-LAD, SVG-OM1, SVG-PDA   Hyperlipemia    Hypertension    Stroke (HCC) 11/2009   Subclavian steal syndrome of left subclavian artery 11/19/2021   Status post left subclavian stent   TIA (transient ischemic attack) 11/11/2021   Brief episode of left sided arm pain and slurred speech   Past Surgical History:  Procedure Laterality Date   CORONARY ARTERY BYPASS GRAFT N/A 01/30/2023   Procedure: CORONARY ARTERY BYPASS GRAFTING TIMES THREE USING LEFT INTERNAL MAMMARY ARTERY AND ENDOHARVEST OF RIGHT LEG  GREATER SAPHENOUS VEIN;  Surgeon: Lucas Dorise POUR, MD;  Location: [facility] OR;  Service: Open Heart Surgery;; LIMA-LAD, SVG-OM1, SVG-PDA   CORONARY/GRAFT ACUTE MI REVASCULARIZATION N/A 12/11/2022   Procedure: Coronary/Graft Acute MI Revascularization;  Surgeon: Ammon Blunt, MD;  Location: [facility] INVASIVE CV LAB;  Service: Cardiovascular;;  Inf STEMI [facility]): prox RCA 100% (Onyx Frontier DES 3.5 mm 22 mm)   LEFT HEART CATH AND CORONARY ANGIOGRAPHY N/A 01/02/2022   Procedure: LEFT HEART CATH AND CORONARY ANGIOGRAPHY;  Surgeon: Anner Alm ORN, MD;  Location: [facility] INVASIVE CV LAB:: dLM ~50%; mLAD 85%@D1  90% (1,1,1) - m-dLAD 90$ @ 65% D2 (0,1,1), RI 75%, pLCx 30%-OM1 60%, pRCA 25% - p-mRCA hazy 85%. Normal LVEF by Echo, EDP 16 mmHg. = Best option CABG   LEFT HEART CATH AND CORONARY ANGIOGRAPHY N/A 12/11/2022   Procedure: LEFT HEART CATH AND CORONARY ANGIOGRAPHY;  Surgeon: Ammon Blunt, MD;  Location: [facility] INVASIVE CV LAB;  Service: Cardiovascular;; Inf STEMI: prox RCA 100% (Onyx Frontier DES 3.5 mm 22 mm); mid-dist LM 75%, prox LAD ~70% & mid~75% , 75% OM1.   PILONIDAL CYST EXCISION  1960, 61, 62, 63   TEE WITHOUT CARDIOVERSION N/A 01/30/2023   Procedure: TRANSESOPHAGEAL ECHOCARDIOGRAM;  Surgeon: Lucas Dorise POUR, MD;  Location: [facility] OR;  Service: Open Heart Surgery;  Laterality: N/A;   TRANSTHORACIC ECHOCARDIOGRAM  11/28/2021   EF 55 to 60%.  No RWMA.  Moderate asymmetric LVH-basal septal.  GR 1 DD.  Normal RV.  Normal MV.  AOV sclerosis without stenosis.  Normal RAP.  TRANSTHORACIC ECHOCARDIOGRAM  12/11/2022   In the setting of Inferior STEMI: EF 50 to 55% with mid to distal inferior wall HK.  GR 1 DD.  Mild MR.  Otherwise normal   UPPER EXTREMITY ANGIOGRAPHY Left 12/17/2021   Procedure: Upper Extremity Angiography;  Surgeon: Jama Cordella MATSU, MD;  Location: [facility] INVASIVE CV LAB;  Service: Cardiovascular:: High-grade stenosis just distal to the origin of the L-Subclavian A, normal Innom A & L  Carotid A => Ost-Prox L SubCl A 8x37x80 Stent - dilated with 9 mm balloon.   VASECTOMY     Zio Patch Monitor  11/2021   Predom Rhtyhm: SR - HR range 47-98 bpm, AVG 64 bpm.  3 runs of NS VT  (> 4 PVC beats): Fastest: 5 beats - HR 125-162 bpm, ~ 145 bpm, 2.1 sec; Longest with Fastest Avg-12 beats, 150-158 bpm, ~ 155 bpm, 4.6 sec.  1  5 beat Atrial Run: HR 125, Avg 120 bpm,   No Sustained Arrhythmias,   Occasional isolated PACs (2.1%) & Rare isolated PVCs (<1%).  No patient triggered events.   Patient Active Problem List   Diagnosis Date Noted   PAD (peripheral artery disease) (HCC) 12/10/2023   Presence of drug coated stent in right coronary artery 12/10/2023   Acute midline low back pain 11/27/2023   Constipation 09/24/2023   Memory impairment 08/17/2023   Former smoker 07/16/2023   History of Warthin tumor 07/16/2023    Class: History of   S/P CABG x 3 02/09/2023   Unstable angina (HCC) 01/25/2023   Weakness generalized 12/12/2022   STEMI involving oth coronary artery of inferior wall (HCC) 12/11/2022   Nightmares 08/04/2022   Senile purpura (HCC) 01/17/2022   Abnormal CT scan of heart    Carotid stenosis 12/01/2021   Precordial pain 11/16/2021   TIA (transient ischemic attack) 11/16/2021   Postural dizziness with near syncope 03/08/2020   Subclavian arterial stenosis (HCC) 12/07/2019   Insomnia 10/01/2018   Pulmonary nodule 07/15/2018   Cerebrovascular disease 07/15/2018   History of stroke 12/02/2017   Dyspnea on exertion 12/02/2017   Coronary artery disease involving native coronary artery without angina pectoris 11/18/2017   Centrilobular emphysema (HCC) 11/03/2017   H/O parotidectomy 06/02/2017   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) 07/30/2016   CKD stage 3 due to type 2 diabetes mellitus (HCC) 03/22/2015   Diabetic peripheral neuropathy (HCC) 06/05/2011   History of cardiovascular disorder 02/28/2010    Diabetes mellitus, type II, insulin  dependent (HCC) 01/31/2010   Hyperlipidemia due to type 2 diabetes mellitus (HCC) 01/31/2010   Essential hypertension 01/31/2010    Onset date: referred 11/12/23  REFERRING DIAG:  J38.3 (ICD-10-CM) - Glottic insufficiency  R49.0 (ICD-10-CM) - Dysphonia  R49.0 (ICD-10-CM) - Hoarseness  J38.3 (ICD-10-CM) - Age-related vocal fold atrophy    THERAPY DIAG:  No diagnosis found.  Rationale for Evaluation and Treatment: Rehabilitation  SUBJECTIVE:   SUBJECTIVE STATEMENT: Pt reports daily hoarseness around mid morning, reflux sx.  Pt accompanied by: significant other  PERTINENT HISTORY: 34 yoM former smoker quit 2 yrs ago, hx of CABG 1.5 yrs ago, hx of right carotid body tumor removal, here for intermittent dysphonia worse in the morning.    He was told he has COPD. He is on reflux medication. No dysphagia sx. He has shortness of breath due to cardiac issues and deconditioning.    He had scope by an ENT done 2 yrs ago, and was told he has  GERD.   Assessment & Plan Dysphonia x 2 yrs  Flexible scope exam w/o masses or lesions, normal movement of both VF, mild VF atrophy and glottic insufficiency. Suspected sub-optimal breath support. We discussed voice therapy and respiratory rehab. He has lots of health issues and would like to hold off  - continue to monitor    GERD LPR -  Reflux Gourmet after meals - diet and lifestyle changes to minimize GERD - Refer to [employer] blog for dietary and lifestyle modifications/reflux cook book  PAIN:  Are you having pain? No  FALLS: Has patient fallen in last 6 months? No, Number of falls: 0  LIVING ENVIRONMENT: Lives with: lives with their spouse Lives in: House/apartment  PLOF:Level of assistance: Independent with ADLs Employment: Retired  PATIENT GOALS: none stated   OBJECTIVE:  Note: Objective measures were completed at Evaluation unless otherwise noted.  DIAGNOSTIC FINDINGS: Flexible  fiberoptic laryngoscopy: The nasal cavity was patent without rhinorrhea or polyp. The nasopharynx was also patent without mass or lesion. The base of tongue was visualized and was normal. There were no signs of pooling of secretions in the piriform sinuses. The true vocal folds were mobile bilaterally. There were no signs of glottic or supraglottic mucosal lesion or mass. There was moderate interarytenoid pachydermia and post cricoid edema.   COGNITION: Overall cognitive status: Within functional limits for tasks assessed  PHYSICAL SYMPTOMS: Do you have any burning, soreness, tickling, or irritation in your throat? Yes Do you sometimes have a sensation of a lump in your throat?  Yes Does your voice get tired easily? Yes, occasionally Do you feel as if you have to strain to produce voice? No Do you feel as if you need to cough or clear your throat? Yes Do you ever lose your voice completely? No Do you ever have difficulty swallowing? No Do you have difficulty projecting your voice? No  VOCAL ABUSE:  Episodes observed during evaluation: none with exception of pt demonstrating throat clear he uses to clear globus sensation Identified triggers: Acid reflux  and Postnasal drip  PERCEPTUAL VOICE ASSESSMENT: Voice quality: hoarse and breathy (mild)  Vocal abuse: reports of daily throat clearing to clear globus, denies habitual throat clearing Resonance: normal Respiratory function: speaking on residual capacity  OBJECTIVE VOICE ASSESSMENT: Maximum phonation time for sustained ah: did not assess Conversational pitch average: WNL Conversational loudness average: WNL S/z ratio: did not assess  (Suggestive of dysfunction >1.0)  STIMULIBILITY TRIALS FOR IMPROVED VOCAL QUALITY: Did not attempt, pt declined   Pt does not report difficulty with swallowing, no further evaluation indicated.   PATIENT REPORTED OUTCOME MEASURES (PROM): Deferred, pt not interested in ST  TODAY'S TREATMENT:     01/05/24: SLP reviewed findings from ENT visit, as pt reporting they found nothing. Explained how reflux can affect voice, how vocal fold atrophy affects voice and therapeutic interventions to address. Pt declines offer of therapy.   PATIENT EDUCATION: Education details: see above Person educated: Patient and Spouse Education method: Explanation Education comprehension: verbalized understanding  HOME EXERCISE PROGRAM: Reflux precautions   ASSESSMENT:  CLINICAL IMPRESSION: Patient is a 78 y.o. M who was seen today for voice evaluation. Evaluation reveals mild dysphonia. Pt's voice is c/b rare breathy quality and mild hoarseness, likely related to findings of reflux and vocal fold atrophy. SLP provided education of these findings and recommendation for therapeutic interventions to address. Per pt, this issue is bottom of the list regarding things he needs to address, declines therapy.  OBJECTIVE IMPAIRMENTS: include voice disorder. These impairments are limiting patient from effectively communicating at home and in community. Factors affecting potential to achieve goals and functional outcome are cooperation/participation level.. Patient will benefit from skilled SLP services to address above impairments and improve overall function, however declined therapy offered.  PLAN:  SLP FREQUENCY: one time visit  SLP DURATION: other: eval and dc   Harlene LITTIE Ned, CCC-SLP 01/05/2024, 9:20 AM

## 2024-01-07 NOTE — Progress Notes (Signed)
 The pt has been advised and states he doing very well on miralax  and had a god BM 2 days ago.  He will call back if things change

## 2024-01-08 ENCOUNTER — Other Ambulatory Visit: Payer: Self-pay

## 2024-01-08 NOTE — Telephone Encounter (Signed)
 LMTCB and let us  know if he would like to proceed with colon as he has been cleared from cardiology.

## 2024-01-12 ENCOUNTER — Encounter: Payer: Self-pay | Admitting: Sports Medicine

## 2024-01-13 ENCOUNTER — Telehealth: Payer: Self-pay

## 2024-01-13 NOTE — Telephone Encounter (Signed)
 I called patient and he has all the medications he needs. He found the insulin .

## 2024-01-13 NOTE — Telephone Encounter (Signed)
 Copied from CRM [id number]. Topic: Clinical - Prescription Issue >> Jan 13, 2024  3:22 PM Graeme ORN wrote: Reason for CRM: Patient called. Requested to speak with Jon. States he always speaks to her about his medication. Needs to order insulin . Would have submitted refill request but he was unsure of the name of the medication he needs. Thank You

## 2024-01-18 ENCOUNTER — Ambulatory Visit (INDEPENDENT_AMBULATORY_CARE_PROVIDER_SITE_OTHER): Admitting: Pulmonary Disease

## 2024-01-18 VITALS — BP 118/77 | HR 76 | Ht 73.0 in | Wt 212.0 lb

## 2024-01-18 DIAGNOSIS — F515 Nightmare disorder: Secondary | ICD-10-CM | POA: Diagnosis not present

## 2024-01-18 DIAGNOSIS — G4719 Other hypersomnia: Secondary | ICD-10-CM | POA: Diagnosis not present

## 2024-01-18 DIAGNOSIS — J432 Centrilobular emphysema: Secondary | ICD-10-CM | POA: Diagnosis not present

## 2024-01-18 NOTE — Patient Instructions (Addendum)
 Referral to neurology  Referral to behavioral therapy-psychotherapist -Nightmares, night terrors  Trial with melatonin, can start with 3 mg at night, can gradually increase to 5 mg and then 10 mg as tolerated  Ensure a safe environment at night  Follow-up in about 6 weeks  It is important to make sure with follow-up with neurology to evaluate for any underlying pathology  Prazosin can be tried for night terrors, nightmares but I think it is important to have evaluation by neurology and psychotherapist prior to trying this

## 2024-01-18 NOTE — Progress Notes (Unsigned)
 Jon [name]    [id number]    1946-03-23  Primary Care Physician:Metheney, Dorothyann BIRCH, MD  Referring Physician: Alvan Dorothyann BIRCH, MD [address] [place],  [place] [address]  Chief complaint:   Patient being seen for sleep issues Longstanding history of nightmares, night terrors  HPI:  Longstanding history since his 63s have been-nightmares, night terrors Does not happen every night  May go weeks and months without any events and then will have frequent events  Has been told several times about waking up screaming, terrorized  Has found himself outside his room, outside his house on a few occasions  Was trialed on trazodone  at some point which he stopped taking because he felt it was not helping his night terrors Helped him fall asleep for a few nights and then was not working any longer  He has not sought help previously  Recently had hospitalization for heart surgery, had what was felt to be sundowning, nightmares, night terrors  He has a good memory about events many years ago before more forgetful about recent memories  History of obstructive lung disease/emphysema History of diabetes CABG x 3 Chronic back pain  He was accompanied by his spouse  Denies any history of snoring, has not been told about snoring previously Denies waking up in the middle of the night for any specific reason outside as relates to having nightmares and night terrors Usually tries to go to bed about 9 PM, on a good night will fall asleep within 10 minutes, 3-5 awakenings Final wake up time between 9 and 10 AM  Recently being evaluated for difficulty hearing  His mom had ALS  Outpatient Encounter Medications as of 01/18/2024  Medication Sig  . Accu-Chek Softclix Lancets lancets Use to check blood sugars twice daily  . aspirin  EC 81 MG tablet Take 1 tablet (81 mg total) by mouth daily. Swallow whole.  . atorvastatin  (LIPITOR ) 40 MG tablet Take 1  tablet (40 mg total) by mouth daily.  . Blood Glucose Monitoring Suppl (ACCU-CHEK GUIDE ME) w/Device KIT Check glucose three times daily. Dx. Code E11.8  . clopidogrel  (PLAVIX ) 75 MG tablet Take 1 tablet (75 mg total) by mouth daily.  SABRA glucose blood (ACCU-CHEK GUIDE TEST) test strip Check glucose three times daily. Dx. Code E11.8  . glucose blood (ACCU-CHEK GUIDE) test strip Dx DM E11.9. Check fasting blood sugar every morning.  . insulin  degludec (TRESIBA  FLEXTOUCH) 100 UNIT/ML FlexTouch Pen Inject 20 Units into the skin daily.  . ketoconazole  (NIZORAL ) 2 % cream Apply topically daily.  . levothyroxine  (SYNTHROID ) 50 MCG tablet Take 1 tablet (50 mcg total) by mouth daily before breakfast. 1 hour before meal.  . metoprolol  succinate (TOPROL -XL) 25 MG 24 hr tablet Take 1 tablet (25 mg total) by mouth daily.  . Semaglutide , 1 MG/DOSE, 4 MG/3ML SOPN Inject 1 mg into the skin once a week.  . SYNJARDY  12.5-500 MG TABS TAKE 1 TABLET BY MOUTH TWICE  DAILY   No facility-administered encounter medications on file as of 01/18/2024.    Allergies as of 01/18/2024 - Review Complete 01/18/2024  Allergen Reaction Noted  . Gabapentin Other (See Comments) 07/04/2014  . Lisinopril  Other (See Comments) 10/01/2018  . Metformin  and related Diarrhea 01/27/2017    Past Medical History:  Diagnosis Date  . BENIGN POSITIONAL VERTIGO 05/20/2010   Qualifier: Diagnosis of  By: Alvan MD, Dorothyann    . Centrilobular emphysema (HCC)   .  Cerebrovascular disease 07/15/2018   Prior CVA & TIA. Carotid CTA ~50% Bilateral ICA,  Multifocal severe stenosis of the left V2 vertebral artery.  Right vertebral artery patent without significant stenosis.  . Coronary artery disease involving native coronary artery with angina pectoris (HCC) 11/18/2017   Severe LM, LAD & RCA stenosis indicated on Coronary CTA => CaCardiac Cath 01/02/2022: dLM ~50%; mLAD 85%@D1  90% (1,1,1) - m-dLAD 90$ @ 65% D2 (0,1,1), RI 75%, pLCx 30%-OM1 60%,  pRCA 25% - p-mRCA hazy 85%. Normal LVEF by Echo, EDP 16 mmHg. => CABG x 3 (LIMA-LAD, SVG-OM1, SVG-PDA) 01/2023  . Current every day smoker    No interest in quitting  . Diabetes mellitus, type II, insulin  dependent (HCC)    With peripheral neuropathy, peripheral vascular disease and coronary artery disease as complications.  SABRA Epididymitis   . History of acute myocardial infarction of inferior wall 12/2022   Progression of mid RCA disease to 100%-DES PCI with Onyx Frontier DES  . Hx of CABG 01/30/2023   Dr. Lucas: LIMA-LAD, SVG-OM1, SVG-PDA  . Hyperlipemia   . Hypertension   . Stroke (HCC) 11/2009  . Subclavian steal syndrome of left subclavian artery 11/19/2021   Status post left subclavian stent  . TIA (transient ischemic attack) 11/11/2021   Brief episode of left sided arm pain and slurred speech    Past Surgical History:  Procedure Laterality Date  . CORONARY ARTERY BYPASS GRAFT N/A 01/30/2023   Procedure: CORONARY ARTERY BYPASS GRAFTING TIMES THREE USING LEFT INTERNAL MAMMARY ARTERY AND ENDOHARVEST OF RIGHT LEG GREATER SAPHENOUS VEIN;  Surgeon: Lucas Dorise POUR, MD;  Location: [facility] OR;  Service: Open Heart Surgery;; LIMA-LAD, SVG-OM1, SVG-PDA  . CORONARY/GRAFT ACUTE MI REVASCULARIZATION N/A 12/11/2022   Procedure: Coronary/Graft Acute MI Revascularization;  Surgeon: Ammon Blunt, MD;  Location: [facility] INVASIVE CV LAB;  Service: Cardiovascular;;  Inf STEMI [facility]): prox RCA 100% (Onyx Frontier DES 3.5 mm 22 mm)  . LEFT HEART CATH AND CORONARY ANGIOGRAPHY N/A 01/02/2022   Procedure: LEFT HEART CATH AND CORONARY ANGIOGRAPHY;  Surgeon: Anner Alm ORN, MD;  Location: [facility] INVASIVE CV LAB:: dLM ~50%; mLAD 85%@D1  90% (1,1,1) - m-dLAD 90$ @ 65% D2 (0,1,1), RI 75%, pLCx 30%-OM1 60%, pRCA 25% - p-mRCA hazy 85%. Normal LVEF by Echo, EDP 16 mmHg. = Best option CABG  . LEFT HEART CATH AND CORONARY ANGIOGRAPHY N/A 12/11/2022   Procedure: LEFT HEART CATH AND CORONARY ANGIOGRAPHY;  Surgeon:  Ammon Blunt, MD;  Location: [facility] INVASIVE CV LAB;  Service: Cardiovascular;; Inf STEMI: prox RCA 100% (Onyx Frontier DES 3.5 mm 22 mm); mid-dist LM 75%, prox LAD ~70% & mid~75% , 75% OM1.  SABRA PILONIDAL CYST EXCISION  1960, 61, 62, 63  . TEE WITHOUT CARDIOVERSION N/A 01/30/2023   Procedure: TRANSESOPHAGEAL ECHOCARDIOGRAM;  Surgeon: Lucas Dorise POUR, MD;  [facility];  Service: Open Heart Surgery;  Laterality: N/A;  . TRANSTHORACIC ECHOCARDIOGRAM  11/28/2021   EF 55 to 60%.  No RWMA.  Moderate asymmetric LVH-basal septal.  GR 1 DD.  Normal RV.  Normal MV.  AOV sclerosis without stenosis.  Normal RAP.  SABRA TRANSTHORACIC ECHOCARDIOGRAM  12/11/2022   In the setting of Inferior STEMI: EF 50 to 55% with mid to distal inferior wall HK.  GR 1 DD.  Mild MR.  Otherwise normal  . UPPER EXTREMITY ANGIOGRAPHY Left 12/17/2021   Procedure: Upper Extremity Angiography;  Surgeon: Jama Cordella MATSU, MD;  Location: [facility] INVASIVE CV LAB;  Service: Cardiovascular:: High-grade stenosis just distal to  the origin of the L-Subclavian A, normal Innom A & L Carotid A => Ost-Prox L SubCl A 8x37x80 Stent - dilated with 9 mm balloon.  SABRA VASECTOMY    . Zio Patch Monitor  11/2021   Predom Rhtyhm: SR - HR range 47-98 bpm, AVG 64 bpm.  3 runs of NS VT  (> 4 PVC beats): Fastest: 5 beats - HR 125-162 bpm, ~ 145 bpm, 2.1 sec; Longest with Fastest Avg-12 beats, 150-158 bpm, ~ 155 bpm, 4.6 sec.  1  5 beat Atrial Run: HR 125, Avg 120 bpm,   No Sustained Arrhythmias,   Occasional isolated PACs (2.1%) & Rare isolated PVCs (<1%).  No patient triggered events.    Family History  Problem Relation Age of Onset  . Diabetes Other        family hx of  . Colon cancer Neg Hx     Social History   Socioeconomic History  . Marital status: Divorced    Spouse name: Not on file  . Number of children: 2  . Years of education: 12th grade  . Highest education level: High school graduate  Occupational History  . Occupation: Retired   Tobacco Use  . Smoking status: Former    Current packs/day: 0.00    Average packs/day: 1 pack/day for 62.0 years (62.0 ttl pk-yrs)    Types: Cigarettes    Start date: 01/13/1960    Quit date: 01/12/2022    Years since quitting: 2.0  . Smokeless tobacco: Never  Vaping Use  . Vaping status: Never Used  Substance and Sexual Activity  . Alcohol use: No  . Drug use: No  . Sexual activity: Not on file  Other Topics Concern  . Not on file  Social History Narrative   Lives alone and Likes to play golf 2 x 3 times a week.   Social Drivers of Health   Financial Resource Strain: Low Risk  (08/01/2022)   Overall Financial Resource Strain (CARDIA)   . Difficulty of Paying Living Expenses: Not hard at all  Food Insecurity: No Food Insecurity (01/25/2023)   Hunger Vital Sign   . Worried About Programme researcher, broadcasting/film/video in the Last Year: Never true   . Ran Out of Food in the Last Year: Never true  Transportation Needs: No Transportation Needs (01/25/2023)   PRAPARE - Transportation   . Lack of Transportation (Medical): No   . Lack of Transportation (Non-Medical): No  Physical Activity: Inactive (08/01/2022)   Exercise Vital Sign   . Days of Exercise per Week: 0 days   . Minutes of Exercise per Session: 0 min  Stress: No Stress Concern Present (01/23/2023)   Received from [facility] of Occupational Health - Occupational Stress Questionnaire   . Feeling of Stress : Not at all  Social Connections: Unknown (01/23/2023)   Received from [facility]   Social Network   . Social Network: Not on file  Intimate Partner Violence: Not At Risk (01/25/2023)   Humiliation, Afraid, Rape, and Kick questionnaire   . Fear of Current or Ex-Partner: No   . Emotionally Abused: No   . Physically Abused: No   . Sexually Abused: No    Review of Systems  Respiratory:  Negative for shortness of breath.   Psychiatric/Behavioral:  Positive for sleep disturbance.     Vitals:   01/18/24 1300   BP: 118/77  Pulse: 76  SpO2: 97%     Physical Exam Constitutional:  Appearance: Normal appearance.  HENT:     Head: Normocephalic.     Mouth/Throat:     Mouth: Mucous membranes are moist.  Eyes:     General: No scleral icterus. Cardiovascular:     Rate and Rhythm: Normal rate and regular rhythm.     Heart sounds: No murmur heard.    No friction rub.  Pulmonary:     Effort: No respiratory distress.     Breath sounds: No stridor. No wheezing or rhonchi.  Musculoskeletal:     Cervical back: No rigidity or tenderness.  Neurological:     Mental Status: He is alert.  Psychiatric:        Mood and Affect: Mood normal.       01/18/2024    1:00 PM  Results of the Epworth flowsheet  Sitting and reading 3  Watching TV 3  Sitting, inactive in a public place (e.g. a theatre or a meeting) 3  As a passenger in a car for an hour without a break 3  Lying down to rest in the afternoon when circumstances permit 3  Sitting and talking to someone 1  Sitting quietly after a lunch without alcohol 1  In a car, while stopped for a few minutes in traffic 0  Total score 17    Data Reviewed: Chest x-ray from 2024 October-no acute infiltrate  Previous CT reviewed  Results from recent cardiac PET reviewed  Assessment/Plan: Longstanding history of nightmares and night terrors - Over 50 years - Only seeking help recently because he recently got married and concerned about ongoing events  No history to suggest he has significant sleep disordered breathing  Tiredness and sleepiness during the day likely related to just not getting adequate sleep at night  I do sense some cognitive decline which may need further evaluation  I believe patient would benefit from evaluation by neurologist - I am concerned about possible onset of dementia -Concern for neurodegenerative disease - Cognitive function testing   Evaluation by behavioral therapy/psychotherapist may also be of  benefit  Trial with melatonin at about 3 mg and gradually increase to about 10 mg as tolerated  Can consider prazosin after evaluation by neurology  A sleep study can be considered if there remains ongoing concern for sleep disordered breathing, a polysomnogram is not useful for the diagnosis of nightmares/night terrors unless it happens on the day that the testing is performed  Encouraged to call with significant concerns  He does have a significant smoking history with emphysema-will need further evaluation and management  Coronary artery disease, status post CABG  Jennet Epley MD [facility] 01/18/2024, 1:54 PM  CC: Alvan Dorothyann BIRCH, *

## 2024-02-04 ENCOUNTER — Ambulatory Visit: Admitting: Cardiology

## 2024-03-01 ENCOUNTER — Encounter: Payer: Self-pay | Admitting: Family Medicine

## 2024-03-01 ENCOUNTER — Ambulatory Visit: Admitting: Family Medicine

## 2024-03-01 VITALS — BP 120/59 | HR 77 | Ht 73.0 in | Wt 214.1 lb

## 2024-03-01 DIAGNOSIS — Z794 Long term (current) use of insulin: Secondary | ICD-10-CM | POA: Diagnosis not present

## 2024-03-01 DIAGNOSIS — J432 Centrilobular emphysema: Secondary | ICD-10-CM | POA: Diagnosis not present

## 2024-03-01 DIAGNOSIS — R4189 Other symptoms and signs involving cognitive functions and awareness: Secondary | ICD-10-CM

## 2024-03-01 DIAGNOSIS — Z23 Encounter for immunization: Secondary | ICD-10-CM | POA: Diagnosis not present

## 2024-03-01 DIAGNOSIS — N1832 Chronic kidney disease, stage 3b: Secondary | ICD-10-CM | POA: Insufficient documentation

## 2024-03-01 DIAGNOSIS — R29818 Other symptoms and signs involving the nervous system: Secondary | ICD-10-CM

## 2024-03-01 DIAGNOSIS — E119 Type 2 diabetes mellitus without complications: Secondary | ICD-10-CM | POA: Diagnosis not present

## 2024-03-01 DIAGNOSIS — J22 Unspecified acute lower respiratory infection: Secondary | ICD-10-CM | POA: Diagnosis not present

## 2024-03-01 DIAGNOSIS — F513 Sleepwalking [somnambulism]: Secondary | ICD-10-CM

## 2024-03-01 DIAGNOSIS — E039 Hypothyroidism, unspecified: Secondary | ICD-10-CM

## 2024-03-01 DIAGNOSIS — L820 Inflamed seborrheic keratosis: Secondary | ICD-10-CM | POA: Diagnosis not present

## 2024-03-01 LAB — POCT GLYCOSYLATED HEMOGLOBIN (HGB A1C): Hemoglobin A1C: 7.1 % — AB (ref 4.0–5.6)

## 2024-03-01 MED ORDER — PREDNISONE 20 MG PO TABS: 40.0000 mg | ORAL_TABLET | Freq: Every day | ORAL | 0 refills | Status: DC

## 2024-03-01 MED ORDER — CLOPIDOGREL BISULFATE 75 MG PO TABS: 75.0000 mg | ORAL_TABLET | Freq: Every day | ORAL | 0 refills | Status: DC

## 2024-03-01 MED ORDER — AMOXICILLIN-POT CLAVULANATE 875-125 MG PO TABS: 1.0000 | ORAL_TABLET | Freq: Two times a day (BID) | ORAL | 0 refills | Status: DC

## 2024-03-01 MED ORDER — ALBUTEROL SULFATE HFA 108 (90 BASE) MCG/ACT IN AERS: 2.0000 | INHALATION_SPRAY | Freq: Four times a day (QID) | RESPIRATORY_TRACT | 0 refills | Status: DC | PRN

## 2024-03-01 NOTE — Patient Instructions (Signed)
 Please call with the Ozempic  dose.

## 2024-03-01 NOTE — Progress Notes (Signed)
 Established Patient Office Visit  Subjective  Patient ID: Jon [name], male    DOB: [birth year]  Age: 78 y.o. MRN: [id number]  Chief Complaint  Patient presents with   Diabetes    HPI Discussed the use of AI scribe software for clinical note transcription with the patient, who gave verbal consent to proceed.  History of Present Illness Jon [name] is a 78 year old male who presents with episodes of sleepwalking and sleep driving.  Parasomnia and sleep-related behaviors - Episodes of sleepwalking and sleep driving occurring approximately every 1-2 months - Most recent episode occurred last night, involving driving to a nearby clubhouse and returning home without recollection until later - Total of four episodes, including urinating on the floor while sleepwalking during a cruise one month ago  Cough and chest congestion - Persistent cough and chest congestion for approximately one week - Symptoms worsen at night  General malaise - Generalized feeling of being unwell for the past year  Cutaneous bleeding and lesions - Blood spots observed on sheets and pillowcases, attributed to bleeding from skin lesions on top of head - History of skin lesions treated with cryotherapy  Glycemic control - Recent increase in blood glucose levels - Hemoglobin A1c increased to 7.1 from previous 6.5 - Increased consumption of sweets - Currently taking Synjardy  and daily insulin  injection - Has not started once-weekly semaglutide  (Ozempic ) due to not receiving the medication  Cardiovascular disease and medication adherence - History of myocardial infarction and coronary artery bypass grafting (triple bypass) - Currently taking metoprolol , atorvastatin , clopidogrel , and aspirin  - Out of metoprolol , atorvastatin , and clopidogrel  for two weeks  Thyroid  dysfunction - Taking levothyroxine  for thyroid  dysfunction - Levothyroxine  initiated after laboratory evidence of  elevated thyroid  levels   Cough x 1 week now going into his chest the last couple of days. Cough is productive and worse at night.    ROS    Objective:     BP (!) 120/59   Pulse 77   Ht 6' 1 (1.854 m)   Wt 214 lb 1.9 oz (97.1 kg)   SpO2 98%   BMI 28.25 kg/m     Physical Exam Vitals and nursing note reviewed.  Constitutional:      Appearance: Normal appearance.  HENT:     Head: Normocephalic and atraumatic.  Eyes:     Conjunctiva/sclera: Conjunctivae normal.  Cardiovascular:     Rate and Rhythm: Normal rate and regular rhythm.  Pulmonary:     Effort: Pulmonary effort is normal.     Breath sounds: Wheezing and rhonchi present.  Skin:    General: Skin is warm and dry.  Neurological:     Mental Status: He is alert.  Psychiatric:        Mood and Affect: Mood normal.      Results for orders placed or performed in visit on 03/01/24  POCT HgB A1C  Result Value Ref Range   Hemoglobin A1C 7.1 (A) 4.0 - 5.6 %   HbA1c POC (<> result, manual entry)     HbA1c, POC (prediabetic range)     HbA1c, POC (controlled diabetic range)        The ASCVD Risk score (Arnett DK, et al., 2019) failed to calculate for the following reasons:   Risk score cannot be calculated because patient has a medical history suggesting prior/existing ASCVD    Assessment & Plan:   Problem List Items Addressed This Visit       Respiratory  Centrilobular emphysema (HCC) (Chronic)   Relevant Medications   predniSONE  (DELTASONE ) 20 MG tablet   albuterol  (VENTOLIN  HFA) 108 (90 Base) MCG/ACT inhaler     Endocrine   Diabetes mellitus, type II, insulin  dependent (HCC)   Relevant Orders   POCT HgB A1C (Completed)   Basic Metabolic Panel (BMET)   Urine Microalbumin w/creat. ratio     Genitourinary   Stage 3b chronic kidney disease (HCC)   Other Visit Diagnoses       Encounter for immunization    -  Primary   Relevant Orders   Flu vaccine HIGH DOSE PF(Fluzone Trivalent) (Completed)      Acquired hypothyroidism       Relevant Orders   TSH   Basic Metabolic Panel (BMET)   Urine Microalbumin w/creat. ratio     Lower respiratory infection       Relevant Medications   amoxicillin -clavulanate (AUGMENTIN) 875-125 MG tablet   predniSONE  (DELTASONE ) 20 MG tablet     Neurocognitive deficits       Relevant Orders   Ambulatory referral to Neurology     Sleep walking       Relevant Orders   Ambulatory referral to Neurology     Seborrheic keratoses, inflamed          Assessment and Plan Assessment & Plan Sleep-related complex behaviors with amnesia and cognitive impairment Recurrent sleep-related complex behaviors with amnesia. Cognitive impairment possibly linked to past cardiac events. Differential includes neurological or psychiatric etiology. - Refer to neurologist for evaluation.  It looks like 1 was placed initially but they had recommended referral to [facility].  I did not see that a new 1 was ordered so I did place that order today. - Suggest securing car keys at night that he cannot drive. - Check on December appointment for memory evaluation.  Lower respiratory tract infection superimposed on chronic centrilobular emphysema Acute bronchitis with mucous and wheezing on chronic centrilobular emphysema. Symptoms persistent for a week, worsening recently. - Prescribe antibiotic, prednisone , and albuterol  for acute bronchitis.  Type 2 diabetes mellitus, insulin  and non-insulin  therapy Type 2 diabetes managed with insulin  and non-insulin  therapies. A1c increased to 7.1 from 6.5, possibly due to dietary indiscretions. Current medications include Synjardy  and Ozempic . - Check semaglutide  (Ozempic ) prescription and dosing. Would like to restart medication  - Encourage dietary moderation, especially sweets. - Discuss potential transition from daily insulin  to weekly Ozempic .  Atherosclerotic heart disease, post-coronary artery bypass graft Atherosclerotic heart disease with  previous CABG. Current medications include aspirin , atorvastatin , clopidogrel , and metoprolol . Potential future clopidogrel  adjustment by cardiologist discussed until f/u with Cards. They will likely stop medication  - Refill clopidogrel  prescription - Continue aspirin  and atorvastatin . - Monitor for clopidogrel  adjustment by cardiologist.  Hypothyroidism, primary Primary hypothyroidism managed with levothyroxine . Emphasized importance of taking medication on an empty stomach for optimal absorption. - Continue levothyroxine , take an hour before breakfast on an empty stomach.  Recurrent bleeding scalp lesions, likely actinic keratoses Recurrent bleeding scalp lesions, likely actinic keratoses. - Freeze larger lesion on temple. - Monitor healing, consider touch-up freezing if necessary.  Lower resp tract infection with centrilobular emphysema  - will tx with prn albuterol , prednisone  and Augmentin.    Cryotherapy Procedure Note  Pre-operative Diagnosis: seb keratosis, with bleeding   Post-operative Diagnosis: same  Locations: 3 lesion on scalp  Indications: bleeding  Anesthesia: Lidocaine  2% without epinephrine  without added sodium bicarbonate   Procedure Details  Patient informed of risks (permanent scarring, infection, light or dark  discoloration, bleeding, infection, weakness, numbness and recurrence of the lesion) and benefits of the procedure and verbal informed consent obtained.  The areas are treated with liquid nitrogen therapy, frozen until ice ball extended 1 mm beyond lesion, allowed to thaw, and treated again. The patient tolerated procedure well.  The patient was instructed on post-op care, warned that there may be blister formation, redness and pain. Recommend OTC analgesia as needed for pain.  3 lesions on the scalp and 1 larger lesion near the right temple  Condition: Stable  Complications: none.  Plan: 1. Instructed to keep the area dry and covered for 24-48h and  clean thereafter. 2. Warning signs of infection were reviewed.   3. Recommended that the patient use OTC acetaminophen  as needed for pain.  4. Return PRN   I spent 40 minutes on the day of the encounter to include pre-visit record review, face-to-face time with the patient and post visit ordering of test.   Return in about 3 months (around 06/01/2024).    Dorothyann Byars, MD

## 2024-03-02 ENCOUNTER — Ambulatory Visit: Payer: Self-pay | Admitting: Family Medicine

## 2024-03-02 DIAGNOSIS — R899 Unspecified abnormal finding in specimens from other organs, systems and tissues: Secondary | ICD-10-CM

## 2024-03-02 DIAGNOSIS — E039 Hypothyroidism, unspecified: Secondary | ICD-10-CM

## 2024-03-02 LAB — MICROALBUMIN / CREATININE URINE RATIO
Creatinine, Urine: 94.2 mg/dL
Microalb/Creat Ratio: 13 mg/g{creat} (ref 0–29)
Microalbumin, Urine: 11.8 ug/mL

## 2024-03-02 LAB — BASIC METABOLIC PANEL WITH GFR
BUN/Creatinine Ratio: 11 (ref 10–24)
BUN: 16 mg/dL (ref 8–27)
CO2: 20 mmol/L (ref 20–29)
Calcium: 9.1 mg/dL (ref 8.6–10.2)
Chloride: 102 mmol/L (ref 96–106)
Creatinine, Ser: 1.47 mg/dL — ABNORMAL HIGH (ref 0.76–1.27)
Glucose: 158 mg/dL — ABNORMAL HIGH (ref 70–99)
Potassium: 5.3 mmol/L — ABNORMAL HIGH (ref 3.5–5.2)
Sodium: 140 mmol/L (ref 134–144)
eGFR: 49 mL/min/1.73 — ABNORMAL LOW (ref >59)

## 2024-03-02 LAB — TSH: TSH: 11.3 u[IU]/mL — ABNORMAL HIGH (ref 0.450–4.500)

## 2024-03-02 NOTE — Telephone Encounter (Signed)
 Orders placed in chart for  potassium and TSH recheck

## 2024-03-02 NOTE — Progress Notes (Signed)
 Hi Donald, your thyroid  is still not optimally controlled.  Please make sure you are taking it an hour before breakfast and at least an hour away from other medications.  And then lets recheck it again in about 6 to 8 weeks to see if it is improving or if we still need to maybe make a dose adjustment.  Your kidney function is still elevated but stable at 1.4.  The potassium level was elevated just slightly by 1/10 of a point I do want a keep an eye on that and plan to recheck your potassium and we recheck your thyroid  in 6 to 8 weeks.

## 2024-03-02 NOTE — Progress Notes (Signed)
 No excess protein in the urine which is good news.

## 2024-03-04 ENCOUNTER — Other Ambulatory Visit: Payer: Self-pay | Admitting: Family Medicine

## 2024-03-07 ENCOUNTER — Ambulatory Visit: Payer: Self-pay

## 2024-03-07 NOTE — Telephone Encounter (Signed)
 FYI Only or Action Required?: FYI only for provider.  Patient was last seen in primary care on 03/01/2024 by Alvan Dorothyann BIRCH, MD.  Called Nurse Triage reporting Cough.  Symptoms began several days ago.  Interventions attempted: Prescription medications: Albuterol  inhaler, Amoxicillin .  Symptoms are: unchanged.  Triage Disposition: See Physician Within 24 Hours  Patient/caregiver understands and will follow disposition?: No, wishes to speak with PCP     Copied from CRM #[id number]. Topic: Clinical - Prescription Issue >> Mar 07, 2024 11:46 AM Avram MATSU wrote: Reason for CRM: patient is calling because the cough spray(not sure of the name but said it was albuterol ) he was provided is not working as well as his antibiotic amoxicillin -clavulanate (AUGMENTIN) 875-125 MG tablet [[id number]]      Reason for Disposition  [1] Continuous (nonstop) coughing interferes with work or school AND [2] no improvement using cough treatment per Care Advice  Answer Assessment - Initial Assessment Questions Patient states the medication prescribed during his visit on 10/21 has not helped with his cough and would like to know if anything else could be prescribed. He declined an appointment but states he can come back in if needed. Please advise.       1. ONSET: When did the cough begin?      10 days 2. SEVERITY: How bad is the cough today?      Moderate  3. SPUTUM: Describe the color of your sputum (e.g., none, dry cough; clear, white, yellow, green)     No 4. HEMOPTYSIS: Are you coughing up any blood? If Yes, ask: How much? (e.g., flecks, streaks, tablespoons, etc.)     No 5. DIFFICULTY BREATHING: Are you having difficulty breathing? If Yes, ask: How bad is it? (e.g., mild, moderate, severe)      No 6. FEVER: Do you have a fever? If Yes, ask: What is your temperature, how was it measured, and when did it start?     No 7. CARDIAC HISTORY: Do you have any history of  heart disease? (e.g., heart attack, congestive heart failure)      Yes 8. LUNG HISTORY: Do you have any history of lung disease?  (e.g., pulmonary embolus, asthma, emphysema)     Emphysema  9. PE RISK FACTORS: Do you have a history of blood clots? (or: recent major surgery, recent prolonged travel, bedridden)     No 10. OTHER SYMPTOMS: Do you have any other symptoms? (e.g., runny nose, wheezing, chest pain)       No  Protocols used: Cough - Acute Non-Productive-A-AH

## 2024-03-07 NOTE — Telephone Encounter (Signed)
 Please call to get more information is it just that he is still coughing or dizzy feel worse is he running a fever?  Did he fill the prednisone ?                                                                                         Is the cough more dry?  I can always order a chest x-ray and have him do it in [place] since he lives in [place] now.

## 2024-03-08 DIAGNOSIS — J432 Centrilobular emphysema: Secondary | ICD-10-CM

## 2024-03-08 NOTE — Telephone Encounter (Signed)
 LM for pt to return call.

## 2024-03-08 NOTE — Telephone Encounter (Signed)
 Unable to reach pt by phone. Portal message sent to call the office if he decides to get the colonoscopy.

## 2024-03-09 ENCOUNTER — Other Ambulatory Visit: Payer: Self-pay | Admitting: Family Medicine

## 2024-03-09 MED ORDER — HYDROCOD POLI-CHLORPHE POLI ER 10-8 MG/5ML PO SUER: 5.0000 mL | Freq: Two times a day (BID) | ORAL | 0 refills | Status: DC | PRN

## 2024-03-09 MED ORDER — HYDROCOD POLI-CHLORPHE POLI ER 10-8 MG/5ML PO SUER: 5.0000 mL | Freq: Two times a day (BID) | ORAL | 0 refills | Status: AC | PRN

## 2024-03-09 NOTE — Telephone Encounter (Signed)
 Orders Placed This Encounter  Procedures   DG Chest 2 View    Standing Status:   Future    Expiration Date:   03/09/2025    Reason for Exam (SYMPTOM  OR DIAGNOSIS REQUIRED):   copgh    Preferred imaging location?:   [facility]

## 2024-03-09 NOTE — Telephone Encounter (Signed)
 Copied from CRM [id number]. Topic: Referral - Question >> Mar 09, 2024 11:27 AM Anairis L wrote: Reason for CRM: Patient changed his mind and would like to get a chest x-ray in [place],thank you.

## 2024-03-09 NOTE — Addendum Note (Signed)
 Addended by: Keneshia Tena D on: 03/09/2024 02:53 PM   Modules accepted: Orders

## 2024-03-09 NOTE — Telephone Encounter (Signed)
Cough med sent.

## 2024-03-09 NOTE — Telephone Encounter (Signed)
 Spoke with patient . He is still having a dry cough , no fever, no dizziness,  - hoarseness has improved somewhat.  He does not feel that a C-Xray is warrented . He had thought maybe a cough syrup could be prescribed  and to give it time to heal.  When asked about taking the prednisone  he responded he has gone through all of the medications given  Requesting a return call  or to send a response via Mychart .

## 2024-03-10 ENCOUNTER — Ambulatory Visit
Admission: RE | Admit: 2024-03-10 | Discharge: 2024-03-10 | Disposition: A | Discharge status: Home / Self Care | Source: Ambulatory Visit | Attending: Family Medicine | Admitting: Family Medicine

## 2024-03-10 DIAGNOSIS — R059 Cough, unspecified: Secondary | ICD-10-CM | POA: Diagnosis not present

## 2024-03-10 DIAGNOSIS — J432 Centrilobular emphysema: Secondary | ICD-10-CM

## 2024-03-14 ENCOUNTER — Ambulatory Visit: Payer: Self-pay | Admitting: Family Medicine

## 2024-03-14 ENCOUNTER — Other Ambulatory Visit: Payer: Self-pay | Admitting: Family Medicine

## 2024-03-14 DIAGNOSIS — J22 Unspecified acute lower respiratory infection: Secondary | ICD-10-CM

## 2024-03-14 MED ORDER — PREDNISONE 20 MG PO TABS: 40.0000 mg | ORAL_TABLET | Freq: Every day | ORAL | 0 refills | Status: AC

## 2024-03-14 NOTE — Progress Notes (Signed)
 Hi Jon [name], chest x-ray looks good no sign of pneumonia.  Is the cough getting a little better?

## 2024-03-15 ENCOUNTER — Ambulatory Visit: Admitting: Gastroenterology

## 2024-03-28 ENCOUNTER — Other Ambulatory Visit: Payer: Self-pay | Admitting: Family Medicine

## 2024-03-28 DIAGNOSIS — J432 Centrilobular emphysema: Secondary | ICD-10-CM

## 2024-03-30 ENCOUNTER — Encounter: Payer: Self-pay | Admitting: Neurology

## 2024-03-30 ENCOUNTER — Ambulatory Visit (INDEPENDENT_AMBULATORY_CARE_PROVIDER_SITE_OTHER): Admitting: Neurology

## 2024-03-30 DIAGNOSIS — F05 Delirium due to known physiological condition: Secondary | ICD-10-CM | POA: Diagnosis not present

## 2024-03-30 DIAGNOSIS — R270 Ataxia, unspecified: Secondary | ICD-10-CM

## 2024-03-30 DIAGNOSIS — F514 Sleep terrors [night terrors]: Secondary | ICD-10-CM

## 2024-03-30 DIAGNOSIS — F0390 Unspecified dementia without behavioral disturbance: Secondary | ICD-10-CM | POA: Diagnosis not present

## 2024-03-30 DIAGNOSIS — G931 Anoxic brain damage, not elsewhere classified: Secondary | ICD-10-CM

## 2024-03-30 DIAGNOSIS — G309 Alzheimer's disease, unspecified: Secondary | ICD-10-CM

## 2024-03-30 MED ORDER — ALPRAZOLAM 0.5 MG PO TABS: 0.5000 mg | ORAL_TABLET | Freq: Every evening | ORAL | 0 refills | Status: AC | PRN

## 2024-03-30 NOTE — Progress Notes (Addendum)
 Sleep Physician : Dr MALVA, Pulmonology and Sleep Recently had hospitalization for heart surgery, had what was felt to be sundowning, nightmares, night terrors   He has a good memory about events many years ago before more forgetful about recent memories   History of obstructive lung disease/emphysema History of diabetes CABG x 3 Chronic back pain  Referred to neurology- sleep.      @[facility]   Provider:  Dedra Gores, MD  Primary Care Physician:  Alvan Dorothyann BIRCH, MD [address]  [place] [address]  Referring Provider: Alvan Dorothyann BIRCH, Md [address] [place],  [place] [address]        Chief Concern for this Consultation:   Patient presents with          HPI: I have the pleasure of meeting with Marcos Nancyann Daring , on 03/30/24 , who is a 78 y.o.  male patient,  seen upon a referral by Dr Milagros ( Pulmonology and Sleep )   for a  Sleep Medicine Consultation.  The patient's referral information stated his excessive daytime sleepiness, but also cognitive decline- asked for an evaluation of Sleep :  nightmares and sleep walking episodes.   The patient had no previous sleep evaluations.  Mr. Okane got re-married in April 2025, and his new wife has described immediately after moving together she was aware  of nightly activities. The described behaviour is that of walking through the house, moving objects, checking the bed, one time he wrapped a lamp into his sheets. He has driven the car in one of these trance like spell, he was reportedly in his underwear and drove to his golf-club, parked and went home again. Drove about 1 mile.  He was sleep walking 2 years ago after his son's wedding- but he had no witnesses.  99% of his dreams  are about a sensation  that someone or something is in the room with him , a threat to him. He has a sense of fear and of doom. Not a REM sleep like story line - this is more a night terror than a nightmare. He  has been at times screaming loudly, appears agitated, his eyes open and his expression that of fear -  he wakes up from being touched or from a verbal stimulus.  Onset of screaming can be at 1 AM and 2 AM, is not reported within the first 60 minutes of sleep as would be typical for SWS parasomnia.  This behaviour has been going on for decades he remembers.  It has been 2-3 times since April at night that he was disoriented looking  for the bathroom, then urinated on the floor, while on a cruise- these events were not happening at home but in an unfamiliar environment.  The initial screams have now not been heard for a month - and that's the time frame of him taking CBD gummies. He has tried to audio record himself he stated, but his wife clarified he thought about it and hasn't done it.   The delirium that followed the open heart surgery lasted 5 days and nights and was severe. Agitation, confusion, he was disoriented to place, and time and people. He has according to her never reached his cognitive baseline again. He is sleepy and drifting to sleep any time he is not physically active or stimulated.  He may sleep up to 12 hours or more of 24 hours.   He goes to bed at  9 PM and often was already asleep while his wife watched TV- the TV watches him. He has no firm rise time or bedtime he snacks late , usually on sweets, and he drifts on and off into noon time.   He seems very hard of hearing. He speaks loudly. His wife repeats every sentence and question I pose to him and he can understand her better, but  still had not correctly processed the information.        Chief concern according to patient:  Past Medical History:  Diagnosis Date   BENIGN POSITIONAL VERTIGO 05/20/2010   Qualifier: Diagnosis of  By: Alvan MD, Dorothyann Burkes emphysema [facility])    Cerebrovascular disease, CVA in 2023 07/15/2018   Prior CVA & TIA. Carotid CTA ~50% Bilateral ICA,  Multifocal severe stenosis of  the left V2 vertebral artery.  Right vertebral artery patent without significant stenosis.   Coronary artery disease involving native coronary artery with angina pectoris 11/18/2017   Severe LM, LAD & RCA stenosis indicated on Coronary CTA => CaCardiac Cath 01/02/2022: dLM ~50%; mLAD 85%@D1  90% (1,1,1) - m-dLAD 90$ @ 65% D2 (0,1,1), RI 75%, pLCx 30%-OM1 60%, pRCA 25% - p-mRCA hazy 85%. Normal LVEF by Echo, EDP 16 mmHg. => CABG x 3 (LIMA-LAD, SVG-OM1, SVG-PDA) 01/2023   Current every day smoker    No interest in quitting   Diabetes mellitus, type II, insulin  dependent (HCC)    With peripheral neuropathy, peripheral vascular disease and coronary artery disease as complications.   Epididymitis    History of acute myocardial infarction of inferior wall 12/2022   Progression of mid RCA disease to 100%-DES PCI with Onyx Frontier DES   Hx of CABG 01/30/2023   Dr. Lucas: LIMA-LAD, SVG-OM1, SVG-PDA   Hyperlipemia    Hypertension    Stroke (HCC) 11/2009   Subclavian steal syndrome of left subclavian artery 11/19/2021   Status post left subclavian stent   TIA (transient ischemic attack) 11/11/2021   Brief episode of left sided arm pain and slurred speech     has a past medical history of BENIGN POSITIONAL VERTIGO (05/20/2010), Centrilobular emphysema (HCC), Cerebrovascular disease (07/15/2018), Coronary artery disease involving native coronary artery with angina pectoris (11/18/2017), Current every day smoker, Diabetes mellitus, type II, insulin  dependent (HCC), Epididymitis, History of acute myocardial infarction of inferior wall (12/2022), CABG (01/30/2023), Hyperlipemia, Hypertension, Stroke (HCC) (11/2009), Subclavian steal syndrome of left subclavian artery (11/19/2021), and TIA (transient ischemic attack) (11/11/2021).   NO ENT surgery  (but some Sinusitis, rhinitis and resp. infection with emphysema  ), Optic migraine with aura onset at age 52,  was for many years migraine free until about 3 months  ago- new onset of migraine with aura.   Sleep walking / Night terror, but no PTSD hx,  Cardiac open heart surgery reportedly with  prolonged anesthesia- 8.5 hours (?)   following surgery and anesthesia , he developed sundowning in the hospital -delirium for  5 nights in a row,  Had previously CVA, PAD, longstanding CAD . GERD,  not a drinker, not overusing or abusing prescription drugs,  quit smoking after 62 years in 2023 , no Substance Abuse, experienced postsurgical Mood changes but denies Depression.     Family medical history: There are no biological family members affected by night terrors, seizures, sleep walking or Sleep apnea.    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  2 adult children, 5 grandchildren.  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  The workplace involved physical activity, outdoor activity, travel.  Nicotine use: 2023.  ETOH use: /,    Caffeine intake in form of: Coffee ( 1 /cup in AM ), Soft drinks (weaning ), Tea ( weaning) or Energy drinks ( including those containing  taurine ). .  Exercises regularly in form of golfing.       Sleep habits and routines are as follows: The patient's dinner time is around 5-6  PM.  Evening time is spent by TV. The patient goes to bed at, or close to, 8-9 PM.  Immediately asleep- The bedroom is not shared , is described as cool, quiet, and dark.  No TV. The patient reports that it takes 0 minutes to fall asleep, then continues to sleep until the AM hours, uninterrupted or woken up by the need to void once ([place]cturia).    The preferred sleep position is variable, with support of 2 pillows, (non- adjustable bed/  ).  The total estimated sleep time is circa 8-12 hours.  Restless sleeper.  Dreams are reportedly frequent/ and can be vivid. Dream enactment or night terrors   9-11 AM is the usual week- day rise time. The patient wakes up spontaneously.  Mr Westcott reports mostly/ feeling refreshed and restored in the morning, waking with symptoms such as dry mouth, morning headaches and fatigue.  He feels slow, cognitively , and needs 1-2 hours to become fully alert.   No sleep paralysis has been experienced.  Naps in daytime are taken daily (there is a desire to nap and plenty of opportunity), lasting from 30 to 90 minutes  and have a refreshing quality.  These do not interfere with nocturnal sleep.    Review of Systems: Out of a complete 14 system review, the patient complains of only the following symptoms, and all other reviewed systems are negative.:  Hypersomnia- excessive daytime sleepiness.  Night terrors, delirium after heart surgery and LONG surgery   Snoring.  How likely are you to doze in the following situations: 0 = not likely, 1 = slight chance, 2 = moderate chance, 3 = high chance Sitting and Reading? Watching Television? Sitting inactive in a public place (theater or meeting)? As a passenger in a car for an hour without a break? Lying down in the afternoon when circumstances permit? Sitting and talking to someone? Sitting quietly after lunch without alcohol? In a car, while stopped for a few minutes in traffic?   Total ESS =21 / 24 points.    FSS endorsed at 35/ 63 points.  GDS: 2/ 15   Social History   Socioeconomic History   Marital status: Divorced, Remarried     Spouse name: N   Number of children: 2,  5 grandchildren    Years of education: 12th grade   Highest education level: High school graduate  Occupational History   Occupation: Retired  Tobacco Use   Smoking status: Former    Current packs/day: 0.00    Average packs/day: 1 pack/day for 62.0 years (62.0 ttl pk-yrs)    Types: Cigarettes    Start date: 01/13/1960    Quit date: 01/12/2022    Years since quitting: 2.2   Smokeless tobacco: Never  Vaping Use   Vaping  status: Never Used  Substance and Sexual Activity  Alcohol use: No   Drug use: No   Sexual activity: Not on file  Other Topics Concern   Not on file  Social History Narrative         Pt lives with family    Retired    Social Drivers of Corporate Investment Banker Strain: Low Risk  (08/01/2022)   Overall Financial Resource Strain (CARDIA)    Difficulty of Paying Living Expenses: Not hard at all  Food Insecurity: No Food Insecurity (01/25/2023)   Hunger Vital Sign    Worried About Running Out of Food in the Last Year: Never true    Ran Out of Food in the Last Year: Never true  Transportation Needs: No Transportation Needs (01/25/2023)   PRAPARE - Administrator, Civil Service (Medical): No    Lack of Transportation (Non-Medical): No  Physical Activity: Inactive (08/01/2022)   Exercise Vital Sign    Days of Exercise per Week: 0 days    Minutes of Exercise per Session: 0 min  Stress: No Stress Concern Present (01/23/2023)   Received from [facility] - Occupational Stress Questionnaire    Feeling of Stress : Not at all  Social Connections: Socially Isolated (08/01/2022)   Social Connection and Isolation Panel    Frequency of Communication with Friends and Family: Three times a week    Frequency of Social Gatherings with Friends and Family: Once a week    Attends Religious Services: Never    Database Administrator or Organizations: No    Attends Engineer, Structural: Never    Marital Status: Divorced    Family History  Problem Relation Age of Onset   Diabetes Other        family hx of   Colon cancer Neg Hx    Sleep apnea Neg Hx     Past Medical History:  Diagnosis Date   BENIGN POSITIONAL VERTIGO 05/20/2010   Qualifier: Diagnosis of  By: Alvan MD, Catherine     Centrilobular emphysema [facility])    Cerebrovascular disease 07/15/2018   Prior CVA & TIA. Carotid CTA ~50% Bilateral ICA,  Multifocal severe  stenosis of the left V2 vertebral artery.  Right vertebral artery patent without significant stenosis.   Coronary artery disease involving native coronary artery with angina pectoris 11/18/2017   Severe LM, LAD & RCA stenosis indicated on Coronary CTA => CaCardiac Cath 01/02/2022: dLM ~50%; mLAD 85%@D1  90% (1,1,1) - m-dLAD 90$ @ 65% D2 (0,1,1), RI 75%, pLCx 30%-OM1 60%, pRCA 25% - p-mRCA hazy 85%. Normal LVEF by Echo, EDP 16 mmHg. => CABG x 3 (LIMA-LAD, SVG-OM1, SVG-PDA) 01/2023   Current every day smoker    No interest in quitting   Diabetes mellitus, type II, insulin  dependent (HCC)    With peripheral neuropathy, peripheral vascular disease and coronary artery disease as complications.   Epididymitis    History of acute myocardial infarction of inferior wall 12/2022   Progression of mid RCA disease to 100%-DES PCI with Onyx Frontier DES   Hx of CABG 01/30/2023   Dr. Lucas: LIMA-LAD, SVG-OM1, SVG-PDA   Hyperlipemia    Hypertension    Stroke (HCC) 11/2009   Subclavian steal syndrome of left subclavian artery 11/19/2021   Status post left subclavian stent   TIA (transient ischemic attack) 11/11/2021   Brief episode of left sided arm pain and slurred speech    Past Surgical History:  Procedure Laterality Date   CORONARY  ARTERY BYPASS GRAFT N/A 01/30/2023   Procedure: CORONARY ARTERY BYPASS GRAFTING TIMES THREE USING LEFT INTERNAL MAMMARY ARTERY AND ENDOHARVEST OF RIGHT LEG GREATER SAPHENOUS VEIN;  Surgeon: Lucas Dorise POUR, MD;  Location: [facility] OR;  Service: Open Heart Surgery;; LIMA-LAD, SVG-OM1, SVG-PDA   CORONARY/GRAFT ACUTE MI REVASCULARIZATION N/A 12/11/2022   Procedure: Coronary/Graft Acute MI Revascularization;  Surgeon: Ammon Blunt, MD;  Location: [facility] INVASIVE CV LAB;  Service: Cardiovascular;;  Inf STEMI [facility]): prox RCA 100% (Onyx Frontier DES 3.5 mm 22 mm)   LEFT HEART CATH AND CORONARY ANGIOGRAPHY N/A 01/02/2022   Procedure: LEFT HEART CATH AND CORONARY ANGIOGRAPHY;   Surgeon: Anner Alm ORN, MD;  Location: [facility] INVASIVE CV LAB:: dLM ~50%; mLAD 85%@D1  90% (1,1,1) - m-dLAD 90$ @ 65% D2 (0,1,1), RI 75%, pLCx 30%-OM1 60%, pRCA 25% - p-mRCA hazy 85%. Normal LVEF by Echo, EDP 16 mmHg. = Best option CABG   LEFT HEART CATH AND CORONARY ANGIOGRAPHY N/A 12/11/2022   Procedure: LEFT HEART CATH AND CORONARY ANGIOGRAPHY;  Surgeon: Ammon Blunt, MD;  Location: [facility] INVASIVE CV LAB;  Service: Cardiovascular;; Inf STEMI: prox RCA 100% (Onyx Frontier DES 3.5 mm 22 mm); mid-dist LM 75%, prox LAD ~70% & mid~75% , 75% OM1.   PILONIDAL CYST EXCISION  1960, 61, 62, 64   TEE WITHOUT CARDIOVERSION N/A 01/30/2023   Procedure: TRANSESOPHAGEAL ECHOCARDIOGRAM;  Surgeon: Lucas Dorise POUR, MD;  Location: [facility] OR;  Service: Open Heart Surgery;  Laterality: N/A;   TRANSTHORACIC ECHOCARDIOGRAM  11/28/2021   EF 55 to 60%.  No RWMA.  Moderate asymmetric LVH-basal septal.  GR 1 DD.  Normal RV.  Normal MV.  AOV sclerosis without stenosis.  Normal RAP.   TRANSTHORACIC ECHOCARDIOGRAM  12/11/2022   In the setting of Inferior STEMI: EF 50 to 55% with mid to distal inferior wall HK.  GR 1 DD.  Mild MR.  Otherwise normal   UPPER EXTREMITY ANGIOGRAPHY Left 12/17/2021   Procedure: Upper Extremity Angiography;  Surgeon: Jama Cordella MATSU, MD;  Location: [facility] INVASIVE CV LAB;  Service: Cardiovascular:: High-grade stenosis just distal to the origin of the L-Subclavian A, normal Innom A & L Carotid A => Ost-Prox L SubCl A [id number] Stent - dilated with 9 mm balloon.   VASECTOMY     Zio Patch Monitor  11/2021   Predom Rhtyhm: SR - HR range 47-98 bpm, AVG 64 bpm.  3 runs of NS VT  (> 4 PVC beats): Fastest: 5 beats - HR 125-162 bpm, ~ 145 bpm, 2.1 sec; Longest with Fastest Avg-12 beats, 150-158 bpm, ~ 155 bpm, 4.6 sec.  1  5 beat Atrial Run: HR 125, Avg 120 bpm,   No Sustained Arrhythmias,   Occasional isolated PACs (2.1%) & Rare isolated PVCs (<1%).  No patient triggered events.     Current Outpatient  Medications on File Prior to Visit  Medication Sig Dispense Refill   Accu-Chek Softclix Lancets lancets Use to check blood sugars twice daily 300 each 3   albuterol  (VENTOLIN  HFA) 108 (90 Base) MCG/ACT inhaler INHALE 2 PUFFS BY MOUTH EVERY 6 HOURS AS NEEDED FOR WHEEZING FOR SHORTNESS OF BREATH 9 g 0   amoxicillin -clavulanate (AUGMENTIN) 875-125 MG tablet Take 1 tablet by mouth 2 (two) times daily. 14 tablet 0   aspirin  EC 81 MG tablet Take 1 tablet (81 mg total) by mouth daily. Swallow whole.     atorvastatin  (LIPITOR ) 40 MG tablet Take 1 tablet (40 mg total) by mouth daily. 90 tablet 3   Blood  Glucose Monitoring Suppl (ACCU-CHEK GUIDE ME) w/Device KIT Check glucose three times daily. Dx. Code E11.8 1 kit 3   chlorpheniramine-HYDROcodone (TUSSIONEX) 10-8 MG/5ML Take 5 mLs by mouth every 12 (twelve) hours as needed for cough. 70 mL 0   clopidogrel  (PLAVIX ) 75 MG tablet Take 1 tablet by mouth once daily 30 tablet 0   glucose blood (ACCU-CHEK GUIDE TEST) test strip Check glucose three times daily. Dx. Code E11.8 100 each PRN   glucose blood (ACCU-CHEK GUIDE) test strip Dx DM E11.9. Check fasting blood sugar every morning. 300 each PRN   insulin  degludec (TRESIBA  FLEXTOUCH) 100 UNIT/ML FlexTouch Pen Inject 20 Units into the skin daily. 1 mL 0   ketoconazole  (NIZORAL ) 2 % cream Apply topically daily. 60 g 3   levothyroxine  (SYNTHROID ) 50 MCG tablet Take 1 tablet (50 mcg total) by mouth daily before breakfast. 1 hour before meal. 90 tablet 0   metoprolol  succinate (TOPROL -XL) 25 MG 24 hr tablet Take 1 tablet by mouth once daily 90 tablet 0   predniSONE  (DELTASONE ) 20 MG tablet Take 2 tablets (40 mg total) by mouth daily with breakfast. 10 tablet 0   Semaglutide , 1 MG/DOSE, 4 MG/3ML SOPN Inject 1 mg into the skin once a week. 9 mL 1   SYNJARDY  12.5-500 MG TABS TAKE 1 TABLET BY MOUTH TWICE  DAILY 200 tablet 2   No current facility-administered medications on file prior to visit.   IMPRESSION: MRI  1.  No acute intracranial abnormality. 2. 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     Electronically Signed   By: Franky Stanford M.D.   On: 11/12/2021 02:10   Abnormal TSH , high and increasing trend, TSH > 10 in October.    Allergies  Allergen Reactions   Gabapentin Other (See Comments)    Nightmares.    Lisinopril  Other (See Comments)    Hypotension   Metformin  And Related Diarrhea    Vitals:   03/30/24 1114  BP: 96/61  Pulse: 69      Physical exam:   General: The patient was alert and appeared not in acute distress. He is not shaven and has a raspy voice.   Dressed in training clothes, some body odor was noted.  The patient is cleanly dressed. He does not remove his cap in the room.  Mood and affect are jovial , very talkative and hard to focus , even closed questions were answered tangentially. .  The patient's interactions are: Cooperative, makes eye contact, has great difficulties with following the instructions and answers - his wife repeats the questions for him and then he answers, very tangential / incoherently. ( He was supposingly dx by audiology with good hearing and an  Auditory processing disorder).   Head: Normocephalic, atraumatic.  Neck is supple. Mallampati: 3.  Rhinophyma. Nasal airflow was not fully patent. Congested . The neck circumference measured 18 inches.  Dental status: dentures.  Cardiovascular:  Regular rate and cardiac rhythm by palpable pulse. Respiratory: no audible wheezing, no tachypnoea.   Skin:  Without evidence of ankle edema. No discoloration.  Trunk:  BMI is 28 with abdominal girth ,  The patient's posture was relaxed, reclined.    Neurologic exam : The patient was awake and alert, oriented to place but unsure of date.  MOCA 17/ 30 .  Attention span & concentration ability appeared severely impaired, logorrhea.   Speech was ongoing,  fluent, without dysarthria, dysphonia or aphasia,  and of  loud volume.     Cranial nerves:  There was no loss of smell or taste reported  Pupils are round, equal in size and briskly reactive to light.  Funduscopic exam was deferred.  Extraocular movements in vertical and horizontal planes were intact and without nystagmus. (No Diplopia reported). Visual fields by finger perimetry are intact. Hearing was intact to soft voice.    Facial sensation intact to fine touch.  Facial motor strength: Symmetric movement and tongue and uvula move midline.  Neck ROM: rotation, tilt and flexion extension were intact for age and shoulder shrug was symmetrical.    Motor exam:  Symmetric bulk, strength and ROM.   hip flexion weakness.  Normal tone without cog- wheeling, and symmetric grip strength.   Sensory:  Fine touch and vibration were tested by tuning fork and intact.  Proprioception tested in the upper extremities was normal.   Coordination: The patient reported no problems with button closure and no changes to penmanship.   The Finger-to-nose maneuver was intact without evidence of ataxia, dysmetria or tremor.   Gait and station: Patient could rise unassisted from a seated position, with  bracing, and walked without assistive device.  Stance was of wide width and he kept his hands behind his back .    He has fallen ,and walks defensively slow.  Tandem gait severely affected  : ATAXIA, right foot  turned slightly outwards , tends to shuffle.  The patient turned with 4-5 steps.  Toe walk was unsteady  Arm swing was not seen - The patient's gait posture was erect.     Deep tendon reflexes: Upper extremities did show symmetric DTRs.  Lower extremity DTRs were symmetric and brisk/ attenuated.   Babinski response was  normal    I would like to thank Dr Neda for allowing me to meet with his patient ,     ASSESSMENT : In summary , Mr Skirvin  is presenting with PAD,  DM, long time smoker, vasculopathy, CAD, CVA and on 01-30-2023 he underwent a  triple bypass surgery, reportedly over 7 hours long  :  He is a newlywed  who lived alone for over 20 years.  His wife moved in only in April. There is no objective description of his sleep habits and cognitive abilities at baseline, neither a good timeline, but patient and wife have felt a correlation of  cognitive decline and surgery- referring to the open heart surgery and post OP delirium for 5 days with violent agitation.   Sundowning, delirium after a prolonged open heart surgery - pump brain ?  Gait changed after surgery . Ataxia, unable to perform tandem  gait, off balance, frequent falls.  He now walks very, very slow.  He is shuffling and gait was wide based.   Auditory processing became delayed. Reading impaired , or interpretation of what he sees is delayed. Impaired multitasking.   He is tangential in thoughts and not sensitive to social or verbal cues in changing  behaviour ( wouldn't  stop talking, repeats questions, restless) . Cognitively impaired see MOCA 17/ 30 points .  MRI ordered, EEG ordered. A formal cognitive /memory evaluation is needed. Referral to neuropsychology was made.   Life long history of parasomnia, suspected to reflect night terrors , but with  initial screaming then leaving the bed - friends have told him about the screaming  12 and 10 years ago, may be even 20 years ago,after fishing or hunting  trips taken together.   He  has been driving while in a state of trance !   He describes his emotional state when experiencing these: Not a story line ( not nightmares, according to him ) but a sense of danger, of someone / something being behind him or sneaking up on him, with a sense of doom..this is a description of night terrors. Sleep walking can be seen in either .    There were no medications listed that would contribute to night terrors.    Excessive Daytime sleepiness : ESS 21-/ 24 , this alone would have called for a sleep study. The patient denies  depression.  Risk factors for OSA were present,  including : edentulous state, Body mass index is 27.97 kg/m., larger neck size and narrow upper airway anatomy. Sleep myoclonus is rarely present, but has been witnessed by spouse 2-3 times.   COGNITIVE decline - accelerated cognitive decline since surgery. MOCA of 17/ 30 - with educational adjustement 18/ 30.   memory formal evaluation needed. Referral to neuropsychology.   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 Symptoms include memory problems, difficulty concentrating, and dizziness. If a patient has already preexisting brain conditions, this can become a permanent impairment. The same is true for repeated insults to the brain   Brain injury from hypoxia.  Post-perfusion syndrome (after heart surgery)  What it is: A collection of temporary neurocognitive and physical symptoms that can occur after cardiovascular surgery that uses a heart-lung machine.  Why it happens: The cardiopulmonary bypass machine is necessary to temporarily take over the function of the heart and lungs, but this can affect the brain's function. Blood pressure will be reduced while the heart pump is in use, thereby perfusion of the brain is reduced . Another potential cause is small clots to the brain during surgery, the longer the time on the heart pump the higher the risk of watershed infarctions and post operative delirium.  Symptoms: Memory difficulties  Trouble with concentration and processing of information. Emotional changes ( becoming easily upset, agitated or tearful)   Dizziness and unsteadiness, gait problems, with high risk of falling.   Vision changes, like fuzziness or double vision.       My Plan is to proceed with:  1) I  ordered an in -lab sleep test, a PSG with expanded EEG-  a male attendant was requested  and wife may have to stay ( high sundowning risk) .  2) MRI brain with and without, may need anti-anxiety. I have ordered xanax  for pre medication before MRI.  3)  lifestyle changes : eliminate late night snack and caffeine in PM. Set your bed time and a firm rise time, allowing for 8-9 hours of bedtime.  Always take your meds at the same time.  Daily exposure to day light, outside activity such as walking for at least 20 minutes daily.    Place a large print calendar in field of view, such as over your breakfast table or on the fridge door. Think of a desk pat ( large office calendar the size of 2 placemats) . Cross out every day at the end of day to limit the focus on the remaining days.    Safety: Hide the car keys,    Install a motion detector to monitor front door and garage entrance.  If you keep any firearms in the home, lock them away. Do not keep any loaded guns anywhere.  3) EEG  with sleep. A 2 hour slot has been requested. SABRA 4) formal  cognitive/ memory evaluation needed. Referral to neuropsychology with [facility] was made.  5) I will hold off on dementia testing  by lab battery/ ATN etc-  holing off on Aricept due to risk of triggering more night terrors or parasomnia activity.   6) follow Dr Parnell for your thyroid  abnormality. Hypothyroidism symptoms can mimic dementia     I plan to follow up personally within 5 months.   A total time of  85  minutes consistent of a part of face to face encounter , exam and interview,  and additional preparation time for chart review was spent .  At today's visit, we discussed treatment options, associated risk and benefits, and engage in counseling as needed including, but not limited to:  Sleep hygiene, Quality Sleep Habits, and Safety concerns for patients with daytime sleepiness who are warned to not operate machinery/ motor vehicles when  drowsy.  Additionally, the following were reviewed: Past medical records, past medical and surgical history, family and social background, as well as relevant laboratory results, imaging findings, and medical notes, where applicable.  This note was generated by myself in part by using dictation software, and as a result, it may contain unintentional typos and errors.  Nevertheless, effort was made to accurately convey the pertinent aspects of the patient's visit.   Dedra Gores, MD.   [facility] and [facility] Sleep Board certified in Sleep Medicine by The [employer] of Sleep Medicine and Diplomate of the [employer] (AASM) . Board certified In Neurology, Diplomat of the ABPN,  Fellow of the [employer].

## 2024-03-30 NOTE — Patient Instructions (Addendum)
 Brain injury from hypoxia.  Post-perfusion syndrome (after heart surgery)  What it is: A collection of temporary neurocognitive and physical symptoms that can occur after cardiovascular surgery that uses a heart-lung machine.  Why it happens: The cardiopulmonary bypass machine is necessary to temporarily take over the function of the heart and lungs, but this can affect the brain's function. Blood pressure will be reduced while the heart pump is in use, thereby perfusion of the brain is reduced . Another potential cause is small clots to the brain during surgery, the longer the time on the heart pump the higher the risk of watershed infarctions and post operative delirium.  Symptoms: Memory difficulties  Trouble with concentration and processing of information. Emotional changes ( becoming easily upset, agitated or tearful)   Dizziness and unsteadiness, gait problems, with high risk of falling.   Vision changes, like fuzziness or double vision.    A formal cognitive /memory evaluation is needed. Referral to neuropsychology was made.     Low Oxygen in the Body (Hypoxia): What to Know Hypoxia happens when there's not enough oxygen getting to your body's tissues and organs. If not treated, hypoxia can be life-threatening. It can lead to loss of consciousness, coma, or brain damage. What are the causes? Hypoxia may be caused by: A collapsed lung or lung injury. Lung infection. Long-term lung disease like chronic obstructive pulmonary disease (COPD) or emphysema. Fluid or blood in the chest. Slow or shallow breathing. A blocked or partially blocked airway. This can happen when: Choking. Eating food. Throwing up. Very bad blood loss or changes in the body's flow of blood. Having low red blood cells, such as with anemia. The heart suddenly stopping. This is called cardiac arrest. Prolonged heart surgery with reduced blood flow . Other causes may include: Being at a high altitude. Carbon  monoxide or cyanide poisoning. Taking medicines or drugs that cause severe sleepiness. Drowning. What are the signs or symptoms? Headache. Feeling dizzy or fainting. Feeling very tired. Confusion. Shortness of breath. Bluish color of the skin, lips, or nails. I would like to thank Dr Neda for allowing me to meet with his patient ,     ASSESSMENT : In summary , Jon [name]  is presenting with PAD,  DM, long time smoker, vasculopathy, CAD, CVA and on 01-30-2023 he underwent a triple bypass surgery, reportedly over 7 hours long  :  He is a newlywed  who lived alone for over 20 years.  His wife moved in only in April. There is no objective description of his sleep habits and cognitive abilities at baseline, neither a good timeline, but patient and wife have felt a correlation of  cognitive decline and surgery- referring to the open heart surgery and post OP delirium for 5 days with violent agitation.   Sundowning, delirium after a prolonged open heart surgery - pump brain ?  Gait changed after surgery . Ataxia, unable to perform tandem  gait, off balance, frequent falls.  He now walks very, very slow.  He is shuffling and gait was wide based.   Auditory processing became delayed. Reading impaired , or interpretation of what he sees is delayed. Impaired multitasking.   He is tangential in thoughts and not sensitive to social or verbal cues in changing  behaviour ( wouldn't  stop talking, repeats questions, restless) . Cognitively impaired.   Life long history of parasomnia, suspected to reflect night terrors , but with  initial screaming then leaving the bed - friends have  told him about the screaming  12 and 10 years ago, may be even 20 years ago,after fishing or hunting  trips taken together.   He has been driving while in a state of trance !   He describes his emotional state when experiencing these: Not a story line ( not nightmares, according to him ) but a sense of danger, of someone /  something being behind him or sneaking up on him, with a sense of doom..this is a description of night terrors. Sleep walking can be seen in either .    There were no medications listed that would contribute to night terrors.    Excessive Daytime sleepiness : ESS 21-/ 24 , this alone would have called for a sleep study. The patient denies depression.  Risk factors for OSA were present,  including : edentulous state, Body mass index is 27.97 kg/m., larger neck size and narrow upper airway anatomy. Sleep myoclonus is rarely present, but has been witnessed by spouse 2-3 times.   COGNITIVE decline - accelerated cognitive decline since surgery. MOCA of 17/ 30 - with educational adjustement 18/ 30.   memory formal evaluation needed. Referral to neuropsychology.   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 Symptoms include memory problems, difficulty concentrating, and dizziness. If a patient has already preexisting brain conditions, this can become a permanent impairment. The same is true for repeated insults to the brain   Brain injury from hypoxia.  Post-perfusion syndrome (after heart surgery)  What it is: A collection of temporary neurocognitive and physical symptoms that can occur after cardiovascular surgery that uses a heart-lung machine.  Why it happens: The cardiopulmonary bypass machine is necessary to temporarily take over the function of the heart and lungs, but this can affect the brain's function. Blood pressure will be reduced while the heart pump is in use, thereby perfusion of the brain is reduced . Another potential cause is small clots to the brain during surgery, the longer the time on the heart pump the higher the risk of watershed infarctions and post  operative delirium.  Symptoms: Memory difficulties  Trouble with concentration and processing of information. Emotional changes ( becoming easily upset, agitated or tearful)   Dizziness and unsteadiness, gait problems, with high risk of falling.   Vision changes, like fuzziness or double vision.    A formal cognitive /memory evaluation is needed. Referral to neuropsychology was made.     My Plan is to proceed with:  1) I ordered an in -lab sleep test, a PSG with expanded EEG-  a male attendant was requested  and wife may have to stay ( high sundowning risk) .  2) MRI brain with and without, may need anti-anxiety. I have ordered xanax  for pre medication before MRI.  3)  lifestyle changes : eliminate late night snack and caffeine in PM. Set your bed time and a firm rise time, allowing for 8-9 hours of bedtime.  Always take your meds at the same time.  Daily exposure to day light, outside activity such as walking for at least 20 minutes daily.    Place a large print calendar in field of view, such as over your breakfast table or on the fridge door. Think of a desk pat ( large office calendar the size of 2 placemats) .  Cross out every day at the end of day to limit the focus on the remaining days.    Safety: Hide the car keys,    Install a motion detector to monitor front door and garage entrance.   If you keep any firearms in the home, lock them away. Do not keep any loaded guns anywhere.  3) EEG  with sleep. A 2 hour slot has been requested. SABRA 4) formal  cognitive/ memory evaluation needed. Referral to neuropsychology with [facility] was made.  5) I will hold off on dementia testing  by lab battery/ ATN/ genetic dementia risk testing,etc-   holding off on Aricept due to risk of triggering more night terrors or parasomnia activity. I plan to follow up personally within 5 months.  Take your medicines only as told. Do not smoke, vape, or use nicotine or tobacco. Avoid secondhand  smoke. Work with your pulmonologist to treat  health problems that may be causing hypoxia, such as COPD. Contact a health care provider if: You have a fever. You still have trouble breathing after treatment. You become short of breath with exercise. Get help right away if: You have chest pain. You become short of breath with normal or very little activity. Your skin, lips, or nails turn blue. You become confused or it's hard to think. These symptoms may be an emergency. Call 911 right away. Do not wait to see if the symptoms will go away. Do not drive yourself to the hospital. This information is not intended to replace advice given to you by your health care provider. Make sure you discuss any questions you have with your health care provider. Document Revised: 06/25/2023 Document Reviewed: 06/25/2023 Elsevier Patient Education  2025 [employer].  Post-coronary artery bypass grafting encephalopathy (CABGE) is associated with cognitive decline in a significant number of post-cardiac surgery cases. Patients may experience this condition as a result of being maintained on a heart-lung machine, which is referred to as the "pump". The pump is used to circulate oxygenated blood during heart or valve surgery and may contribute to complication with oxygen flow to the brain. In some cases, the emotional traumatic effects of CABGE may produce symptoms of anxiety that can often mimic the cognitive decline associated with cardiac bypass and use of the pump. When this is the case, cognitive-behavioral assessment may be useful in helping patients to differentiate symptoms generated by anxiety from those produced by the effects of the pump. A discussion section addresses further implications of such overlapping symptoms and therapeutic strategies for treatment and remediation, along with the potential adverse effects that may occur through psychotherapy.  Keywords:  coronary artery bypass grafting,   pumphead,  cognitive impairment,  Encephalopathy Delirium.

## 2024-03-31 ENCOUNTER — Telehealth: Payer: Self-pay | Admitting: Neurology

## 2024-03-31 NOTE — Telephone Encounter (Signed)
 no auth required sent to [facility] [phone]

## 2024-04-04 ENCOUNTER — Telehealth: Payer: Self-pay | Admitting: Cardiology

## 2024-04-04 NOTE — Telephone Encounter (Signed)
 Called patient regarding myChart message . Patient states he saw neurologist and she did not like the way it looked regarding the way the blood was pumping.  Patient states when he is walking, he is slow, unsteady and he wobbles,  and his neurologist states this came from his bypass procedure. Patient is requesting Dr. Anner read the note from 03/30/2024 from his neurologist Dr. Chalice. Patient states that Dr. Chalice states his symptoms are related to his bypass procedure, she states the blood flow may have been turned down too low. Please call to discuss.  Neurologist ordered an MRI.  This is myChart message from patient: Comments: MRI set for brain with another doctor from visit.  Have symptoms . a phone call to begin with would work. Problems with circulation after triple by-pass from a year ago. try to read report from my chart Marcos Nancyann Daring ot this visit from Dr. Estanislado Dohmeier She goes into detail to say the least.

## 2024-04-12 ENCOUNTER — Telehealth: Payer: Self-pay

## 2024-04-12 NOTE — Telephone Encounter (Signed)
 PAP: Patient assistance application for Tresiba  through [employer] has been mailed to pt's home address on file. Provider portion of application will be faxed to provider's office.Spoke to patient and e-filed per request.

## 2024-04-14 ENCOUNTER — Telehealth: Payer: Self-pay | Admitting: Acute Care

## 2024-04-14 ENCOUNTER — Ambulatory Visit (HOSPITAL_COMMUNITY): Admitting: Licensed Clinical Social Worker

## 2024-04-14 DIAGNOSIS — Z87891 Personal history of nicotine dependence: Secondary | ICD-10-CM

## 2024-04-14 DIAGNOSIS — Z122 Encounter for screening for malignant neoplasm of respiratory organs: Secondary | ICD-10-CM

## 2024-04-14 NOTE — Telephone Encounter (Signed)
 Lung Cancer Screening Narrative/Criteria Questionnaire (Cigarette Smokers Only- No Cigars/Pipes/vapes)   Jon [name]   SDMV:04/27/2024 11:30a [name]     [birth year]   LDCT: 04/28/2024 12:00p [facility]    78 y.o.   Phone: [phone]  Lung Screening Narrative (confirm age 89-77 yrs Medicare / 50-80 yrs Private pay insurance)   Insurance information:[employer] mcr   Referring Provider:Self referral    This screening involves an initial phone call with a team member from our program. It is called a shared decision making visit. The initial meeting is required by  insurance and Medicare to make sure you understand the program. This appointment takes about 15-20 minutes to complete. You will complete the screening scan at your scheduled date/time.  This scan takes about 5-10 minutes to complete. You can eat and drink normally before and after the scan.  Criteria questions for Lung Cancer Screening:   Are you a current or former smoker? Former Age began smoking: 78yo   If you are a former smoker, what year did you quit smoking? 2023(within 15 yrs)   To calculate your smoking history, I need an accurate estimate of how many packs of cigarettes you smoked per day and for how many years. (Not just the number of PPD you are now smoking)   Years smoking 57 x Packs per day 1 = Pack years 57   (at least 20 pack yrs)   (Make sure they understand that we need to know how much they have smoked in the past, not just the number of PPD they are smoking now)  Do you have a personal history of cancer?  No    Do you have a family history of cancer? No  Are you coughing up blood?  No  Have you had unexplained weight loss of 15 lbs or more in the last 6 months? No  It looks like you meet all criteria.  When would be a good time for us  to schedule you for this screening?   Additional information: N/A

## 2024-04-18 NOTE — Telephone Encounter (Signed)
 PAP: Application for Evaristo Bury has been submitted to [employer], via fax

## 2024-04-20 NOTE — Telephone Encounter (Signed)
 PAP: Patient assistance application for Tresiba  has been approved by PAP Companies: [employer] from 05/12/2024 to 05/11/2025. Medication should be delivered to PAP Delivery: Provider's office. For further shipping updates, please contact [employer] at [phone]. Patient ID is: [id number]

## 2024-04-22 ENCOUNTER — Other Ambulatory Visit: Payer: Self-pay | Admitting: Family Medicine

## 2024-04-27 ENCOUNTER — Telehealth: Payer: Self-pay

## 2024-04-27 ENCOUNTER — Encounter: Payer: Self-pay | Admitting: Adult Health

## 2024-04-27 ENCOUNTER — Ambulatory Visit: Admitting: Adult Health

## 2024-04-27 DIAGNOSIS — Z87891 Personal history of nicotine dependence: Secondary | ICD-10-CM

## 2024-04-27 NOTE — Progress Notes (Signed)
°  Virtual Visit via Telephone Note  I connected with Jon [name] , 04/27/2024 11:22 AM by a telemedicine application and verified that I am speaking with the correct person using two identifiers.  Location: Patient: home Provider: home   I discussed the limitations of evaluation and management by telemedicine and the availability of in person appointments. The patient expressed understanding and agreed to proceed.   Shared Decision Making Visit Lung Cancer Screening Program [phone])   Eligibility: 78 y.o. Pack Years Smoking History Calculation = 57 pack years  (# packs/per year x # years smoked) Recent History of coughing up blood  no Unexplained weight loss? no ( >Than 15 pounds within the last 6 months ) Prior History Lung / other cancer no (Diagnosis within the last 5 years already requiring surveillance chest CT Scans). Smoking Status Former Smoker Former Smokers: Years since quit: 2 years  Quit Date: 2023  Visit Components: Discussion included one or more decision making aids. YES Discussion included risk/benefits of screening. YES Discussion included potential follow up diagnostic testing for abnormal scans. YES Discussion included meaning and risk of over diagnosis. YES Discussion included meaning and risk of False Positives. YES Discussion included meaning of total radiation exposure. YES  Counseling Included: Importance of adherence to annual lung cancer LDCT screening. YES Impact of comorbidities on ability to participate in the program. YES Ability and willingness to under diagnostic treatment. YES  Smoking Cessation Counseling: Former Smokers:  Discussed the importance of maintaining cigarette abstinence. yes Diagnosis Code: Personal History of Nicotine Dependence. S12.108 Information about tobacco cessation classes and interventions provided to patient. Yes Patient provided with ticket for LDCT Scan. yes Written Order for Lung Cancer Screening with  LDCT placed in Epic. Yes (CT Chest Lung Cancer Screening Low Dose W/O CM) [id number]  Z12.2-Screening of respiratory organs Z87.891-Personal history of nicotine dependence   [name] 04/27/2024

## 2024-04-27 NOTE — Telephone Encounter (Signed)
 Pt Cancelled LDCT due to a family function. States he will call back in 30 days to reschedule. Pt has direct number to call.

## 2024-04-27 NOTE — Patient Instructions (Signed)

## 2024-04-28 ENCOUNTER — Inpatient Hospital Stay: Admission: RE | Admit: 2024-04-28

## 2024-05-04 ENCOUNTER — Other Ambulatory Visit: Payer: Self-pay | Admitting: Family Medicine

## 2024-05-04 DIAGNOSIS — E039 Hypothyroidism, unspecified: Secondary | ICD-10-CM

## 2024-05-10 ENCOUNTER — Telehealth: Payer: Self-pay

## 2024-05-10 NOTE — Telephone Encounter (Signed)
 Would patient need to schedule an OV to discuss this ?

## 2024-05-10 NOTE — Telephone Encounter (Signed)
 Copied from CRM [id number]. Topic: Clinical - Medical Advice >> May 10, 2024  3:26 PM Montie POUR wrote: Reason for CRM:  Jon [name] would like to discuss have cataract surgery because he is not seeing well. Please call him at [phone] to discuss. He would like to know who to go to in [place].

## 2024-05-10 NOTE — Telephone Encounter (Signed)
 Let get a litt more info  Has he returned to the original surgeon.  Sometimes there can be some debris that lays down on top of the new lens implant that can be easily treated with a laser.  It is not an uncommon complication after surgery.  What did the surgeon recommend or say?  Do we need to request a second opinion?

## 2024-05-11 NOTE — Telephone Encounter (Signed)
 Okay, if he is needing consultation for cataracts then I would recommend [facility] eye care center or [facility]

## 2024-05-13 NOTE — Telephone Encounter (Signed)
 Called patient wife charlie number and left this detailed information on voice mail - ( she had given verbal permission to do this during our previous conversation)

## 2024-05-19 ENCOUNTER — Ambulatory Visit: Admitting: Family Medicine

## 2024-05-19 ENCOUNTER — Encounter: Payer: Self-pay | Admitting: Cardiology

## 2024-05-25 ENCOUNTER — Ambulatory Visit (HOSPITAL_COMMUNITY): Admitting: Licensed Clinical Social Worker

## 2024-05-26 ENCOUNTER — Encounter: Payer: Self-pay | Admitting: Psychology

## 2024-05-26 ENCOUNTER — Other Ambulatory Visit: Payer: Self-pay | Admitting: Neurology

## 2024-05-26 DIAGNOSIS — G931 Anoxic brain damage, not elsewhere classified: Secondary | ICD-10-CM

## 2024-05-26 DIAGNOSIS — F514 Sleep terrors [night terrors]: Secondary | ICD-10-CM

## 2024-05-26 DIAGNOSIS — F05 Delirium due to known physiological condition: Secondary | ICD-10-CM

## 2024-05-26 DIAGNOSIS — G309 Alzheimer's disease, unspecified: Secondary | ICD-10-CM

## 2024-05-26 NOTE — Progress Notes (Signed)
 Reordered EEG 2 hour slot.

## 2024-05-30 ENCOUNTER — Telehealth: Payer: Self-pay | Admitting: Neurology

## 2024-05-30 NOTE — Telephone Encounter (Signed)
 NPSG- [employer] medicare no auth req (Dr requested Adina for this pt, wife may also be here)   Patient is scheduled at [facility] for 06/20/24 at 8 pm.  Mailed packet and sent mychart.

## 2024-05-31 ENCOUNTER — Ambulatory Visit: Admitting: Neurology

## 2024-05-31 DIAGNOSIS — G309 Alzheimer's disease, unspecified: Secondary | ICD-10-CM

## 2024-05-31 DIAGNOSIS — F05 Delirium due to known physiological condition: Secondary | ICD-10-CM

## 2024-05-31 DIAGNOSIS — R4182 Altered mental status, unspecified: Secondary | ICD-10-CM | POA: Diagnosis not present

## 2024-05-31 DIAGNOSIS — F514 Sleep terrors [night terrors]: Secondary | ICD-10-CM

## 2024-05-31 DIAGNOSIS — G931 Anoxic brain damage, not elsewhere classified: Secondary | ICD-10-CM

## 2024-06-06 ENCOUNTER — Ambulatory Visit: Payer: Self-pay | Admitting: Neurology

## 2024-06-06 DIAGNOSIS — F514 Sleep terrors [night terrors]: Secondary | ICD-10-CM | POA: Insufficient documentation

## 2024-06-06 NOTE — Procedures (Signed)
 [facility]  EEG (ELECTROENCEPHALOGRAM) REPORT  Jon [name]    ORDERING CLINICIAN: Dedra Gores, M.D.  TECHNOLOGIST: ,Burnard Plummer REEGT TECHNIQUE:  This EEG study was done with scalp electrodes positioned according to the 10-20 International system of electrode placement. Electrical activity was reviewed with band pass filter of 1-70Hz , sensitivity of 7 uV/mm, display speed of 62mm/sec with a 60Hz  notched filter applied as appropriate. EEG data were recorded continuously and digitally stored.    Recording duration : 2 hour EEG to allow capturing sleep and parasomnia activity.  Activation included:  Photic stimulation yes, but no Hyperventilation ( COPD and age)  .      Description: The EEG's posterior dominant background rhythm of 7 hertz was symmetrically displayed while the patient's eyes were closed  and sluggishly attenuated with eye opening.  At baseline, the recording showed a moderate amplitude in symmetric fashion.  Photic stimulation was initiated at frequencies from 3- through 21 hertz, resulting neither in photic entrainment nor epileptiform activity.  Hyperventilation maneuver was deferred. EEG was reviewed for a period of2 minutes post  photic stimulation maneuver, and indicated drowsiness and sleep-  the EEG remained slow. One sudden outburst of the patient slapping the left hand on the armrest of the recliner followed by laughter was captured, there was no seizure activity just slow sleep  EEG.   The patient was recorded while awake, while drowsy ,and  while asleep.   ECG: regular heart rate at  about 60 BPM.   IMPRESSION:  This EEG captured sleep parasomnia activity during the second hour of recording. Laughing and slapping the armrest of the chair while not responding to verbal stimuli,  dream enactment.    Dr. Dedra Gores, M.D. Accredited by the ABPN, ABSM.

## 2024-06-09 ENCOUNTER — Encounter: Attending: Psychology | Admitting: Psychology

## 2024-06-09 NOTE — Progress Notes (Incomplete)
 "  NEUROPSYCHOLOGICAL EVALUATION [place]. [facility]  Physical Medicine and Rehabilitation     Patient: Jon [name]  DOB: 1945/12/02  Age: 79 y.o. Sex: male  Race/Ethnicity: White or Caucasian *** Years of Ed.: ***  Collateral Source: Armed [employer] Technical Officer, Tiffany   Referring Provider: Dohmeier, Dedra, MD Provider / Neuropsychologist: Evalene DOROTHA Riff, PsyD  Date of Service: @DOS @ Start: *** End: *** Location:  [place] Physical Medicine & Rehabilitation Department [address], [place], [place] [address] Billing Code/Service: [phone] (1 Unit), [phone] (1 Unit) 1 hour and 15 minutes spent in face-to-face clinical interview and remaining 45 minutes was spent in record review, documentation, and testing protocol construction.   Individuals Present: The patient was seen by the provider, in-person, in the provider's outpatient office. The patient was ***  PATIENT CONSENT AND CONFIDENTIALITY The patient's understanding of the reason for referral was intact. Discussed limits of confidentiality including, but not limited to, posting of final evaluation report in the patient's electronic medical record for both the patient and for the referring provider and appropriate medical professionals. Patient was given the opportunity to have their questions answered. The neuropsychological evaluation process was discussed with the patient and they consented to proceed with the evaluation.  Consent for Evaluation and Treatment: Signed: Yes Explanation of Privacy Policies: Signed: Yes Discussion of Confidentiality Limits: Yes  REASON FOR REFERRAL & RECORD REVIEW The patient is a 79 year old man with medical history of cerebrovascular disease with TIA in 2023, T2DM, history of myocardial infarction (2024), CABG (2024), HTN, HLD, and stroke (2011) per records. The patient was referred for neuropsychological evaluation by his neurologist, Dr. Chalice,    was referred for neuropsychological evaluation by ***   Upon interview, the patient ***  HISTORY OF PRESENTING CONCERNS:  Cognitive Symptom Onset & Course:  Tripple bypass 2024. Suspects was on hart lung pump too many hours.   Current wife, knows baseline from many years ago, still thinks he is different form how he was. Personality changes, more outgoing less reserved.   Since January, things have been stable per collateral.     Patient noticed some changes. Since heart attack, he thinks that when difficulty began then. But not sure.   TIA 2023. Medical events mainly the memory and nightmares, those are things collateral has noticed.   Current Cognitive Complaints:  Memory:  Patient: Somewhat unclear as to whether aware of memory. Cannot answer questions directly. At times, yes, feels like trouble with short term memory. Slow to recall things.  Collateral: Trouble remembering conversations. Consistent. Will remember parts of recent events, but lose some of it. No trouble getting lost in familiar locations. Will re-ask where to turn off at places he knows where to turn off.  Processing Speed:  Patient: Unsure.  Collateral: Yes, sees him needing longer to think things through.    Attention & Concentration: Patient: Distractible.  Collateral: ***   Behavioral Observations: Tangential, attention difficulties.  Language:  Patient: Difficulty understanding the. Not big issue word finding. No word use errors.  Collateral:    Behavioral Observation: Word finding (he is aware and able to reference it  Visual-Spatial:  Patient: *** Collateral: ***   Executive Functioning:  Patient: Organization unchanged. Not noticing problem solving changes. Doesn't feel like personality has changed.  Collateral: Feels strongly that personality has changed.     Motor/Sensory Complaints:  Sensory changes: Hearing test is fine. Vision is fine. No changes smell or  taste.  Balance/coordination  difficulties: Balance reportedly, but she says he has fallen six times since married.  Frequent instances of dizziness/vertigo: Nothing recent. No lightheaded.  Other motor difficulties: No tremor.   Emotional and Behavioral Functioning:  Depression Symptomatology: No frequent down or blue. No current or clear past. Energy is pretty.  Anxiety Symptomatology:  No indications of anxiety currently.  Other Symptomatology: No Tx or Dx.   Sleep: Night terrors. Nightmares for many years. 12 hours sleep regularly, at least since the last year.   Appetite: Good. No significant unintentional weight loss over last year.  Caffeine: Once a day, coffee.   Alcohol Use: Never drank much.  Tobacco Use: Quit smoking 1.5 years ago after 62 years.  Recreational Substance Use: Nothing current or recent.    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 Earned Bs and Cs. 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   Psychosocial: Marital Status: Married. One prior marriage.    Children/Grandchildren: 2 adult children (daughter 75, son 61), 5 grandchildren.    Living Situation: Two in the home.    Daily Activities/Hobbies: Played golf until six months ago. Spend time with his kids. Went on cruises   Level of Functional Independence: The patient is *** with basic activities of daily living.  Finances: Not. No troubles keeping track of the bills.    Shopping / Meal Preparation: ***   Household Maintenance / Chores: Firefighter / Future Obligations: Wife noticed, trouble keeping track of appointments.   Medication Management: Helps with medication as well.      Driving: ***    Medical History/Record Review: Per records and patient report, History of traumatic brain injury/concussion: No.    History of stroke: **   History of heart attack: 2 in 2024, and 1 in 2025.  History of  cancer/chemotherapy: No   History of seizure activity: No   Symptoms of chronic pain: No   Experience of frequent headaches/migraines: Used to get migraines (w/aura). 2 in the last year. Usually stress related.     Imaging/Lab Results: *** Past Medical History:  Diagnosis Date   BENIGN POSITIONAL VERTIGO 05/20/2010   Qualifier: Diagnosis of  By: Alvan MD, Catherine     Centrilobular emphysema [facility])    Cerebrovascular disease 07/15/2018   Prior CVA & TIA. Carotid CTA ~50% Bilateral ICA,  Multifocal severe stenosis of the left V2 vertebral artery.  Right vertebral artery patent without significant stenosis.   Coronary artery disease involving native coronary artery with angina pectoris 11/18/2017   Severe LM, LAD & RCA stenosis indicated on Coronary CTA => CaCardiac Cath 01/02/2022: dLM ~50%; mLAD 85%@D1  90% (1,1,1) - m-dLAD 90$ @ 65% D2 (0,1,1), RI 75%, pLCx 30%-OM1 60%, pRCA 25% - p-mRCA hazy 85%. Normal LVEF by Echo, EDP 16 mmHg. => CABG x 3 (LIMA-LAD, SVG-OM1, SVG-PDA) 01/2023   Current every day smoker    No interest in quitting   Diabetes mellitus, type II, insulin  dependent (HCC)    With peripheral neuropathy, peripheral vascular disease and coronary artery disease as complications.   Epididymitis    History of acute myocardial infarction of inferior wall 12/2022   Progression of mid RCA disease to 100%-DES PCI with Onyx Frontier DES   Hx of CABG 01/30/2023   Dr. Lucas: LIMA-LAD, SVG-OM1, SVG-PDA   Hyperlipemia    Hypertension    Stroke (HCC) 11/2009  Subclavian steal syndrome of left subclavian artery 11/19/2021   Status post left subclavian stent   TIA (transient ischemic attack) 11/11/2021   Brief episode of left sided arm pain and slurred speech   Patient Active Problem List   Diagnosis Date Noted   Adult night terror 06/06/2024   Stage 3b chronic kidney disease (HCC) 03/01/2024   PAD (peripheral artery disease) 12/10/2023   Presence of drug coated stent in right  coronary artery 12/10/2023   Acute midline low back pain 11/27/2023   Constipation 09/24/2023   Memory impairment 08/17/2023   Former smoker 07/16/2023   History of Warthin tumor 07/16/2023    Class: History of   S/P CABG x 3 02/09/2023   Unstable angina (HCC) 01/25/2023   Weakness generalized 12/12/2022   STEMI involving oth coronary artery of inferior wall (HCC) 12/11/2022   Nightmares 08/04/2022   Senile purpura 01/17/2022   Abnormal CT scan of heart    Carotid stenosis 12/01/2021   Precordial pain 11/16/2021   TIA (transient ischemic attack) 11/16/2021   Postural dizziness with near syncope 03/08/2020   Subclavian arterial stenosis 12/07/2019   Insomnia 10/01/2018   Pulmonary nodule 07/15/2018   Cerebrovascular disease 07/15/2018   History of stroke 12/02/2017   Dyspnea on exertion 12/02/2017   Coronary artery disease involving native coronary artery without angina pectoris 11/18/2017   Centrilobular emphysema (HCC) 11/03/2017   H/O parotidectomy 06/02/2017   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) 07/30/2016   CKD stage 3 due to type 2 diabetes mellitus (HCC) 03/22/2015   Diabetic peripheral neuropathy (HCC) 06/05/2011   History of cardiovascular disorder 02/28/2010   Diabetes mellitus, type II, insulin  dependent (HCC) 01/31/2010   Hyperlipidemia due to type 2 diabetes mellitus (HCC) 01/31/2010   Essential hypertension 01/31/2010   Family Neurologic/Medical Hx: *** Family History  Problem Relation Age of Onset   Diabetes Other        family hx of   Colon cancer Neg Hx    Sleep apnea Neg Hx     Medications: ***     Mental Status/Behavioral Observations: The patient was seen on an outpatient basis in the [facility] office for the clinical interview *** Sensorium/Arousal: ***   Orientation: ***   Appearance: ***   Behavior: ***   Speech/Language: ***   Motor: ***   Social Comportment: ***   Mood:  ***   Affect: ***   Thought Process/Content: ***   Ability to Participate in Interview: ***   Insight: ***    SUMMARY / CLINICAL IMPRESSIONS ***  DISPOSITION / PLAN The patient has been set up for a formal neuropsychological assessment to objectively assess her cognitive functioning across domains to establish the patient's cognitive profile. This data, in conjunction with information obtained via clinical interview and medical record review, will help clarify likely etiology and guide treatment recommendations. Once data collection and interpretation have been completed, the findings / diagnosis and recommendations will be reviewed and discussed with the patient during a feedback appointment with the neuropsychologist. Based on the collaborative dialogue with the patient during the feedback, recommendations may be adjusted / tailored as needed. A formal report will be produced and provided to the patient and the referring provider.   Diagnosis:    FULL REPORT TO FOLLOW   Jon DOROTHA Riff, PsyD [facility]-Clinical Neuropsychology [address] [place], [place] [address] Main: [phone] Fax: [phone] [place] License # [id number]  This report was  generated using voice recognition software. While this document has been carefully reviewed, transcription errors may be present. I apologize in advance for any inconvenience. Please contact me if further clarification is needed.  "

## 2024-06-13 ENCOUNTER — Encounter

## 2024-06-13 NOTE — Telephone Encounter (Signed)
 Pt is now 79 and no longer qualifies for the LDCT. Order has been cancelled, pt never called back to reschedule.

## 2024-06-14 ENCOUNTER — Ambulatory Visit: Admitting: Family Medicine

## 2024-06-14 ENCOUNTER — Telehealth: Payer: Self-pay

## 2024-06-14 ENCOUNTER — Other Ambulatory Visit: Payer: Self-pay

## 2024-06-14 DIAGNOSIS — E119 Type 2 diabetes mellitus without complications: Secondary | ICD-10-CM

## 2024-06-14 MED ORDER — ACCU-CHEK GUIDE TEST VI STRP: ORAL_STRIP | 0 refills | Status: AC

## 2024-06-14 NOTE — Telephone Encounter (Signed)
 Received Tresiba  Flextouch from patient assistance  2 boxes ( 5ml each)   Patient informed and will pick up these at his convenience.

## 2024-06-17 ENCOUNTER — Encounter

## 2024-06-17 NOTE — Progress Notes (Unsigned)
 "  Mental Status/Behavioral Observations (06/17/2024):  Orientation: The patient was oriented to self and place. He was oriented to time with the exception of the day of the month. Sensory/Arousal: Hearing was adequate for the demands of testing. Vision was adequate with the assistance of corrective lenses. The patient was alert. Appearance: Dress and hygiene were appropriate for the setting.  Speech/Language: In conversation, the patient's speech was prosodic, fluent, and well-articulated. The patient displayed no indications of word finding difficulties and no word substitution errors were observed.  Motor: The patient ambulated independently with a slight shuffle. No tremors were observed.  Social Comportment: Social behavior was appropriate for the setting, although the patient often made jokes/asked personal questions throughout the testing session. He occasionally attempted to make physical contact in a joking manner. Mood/Affect: Mood was largely neutral to positive. Affect was congruent with mood.  Attention/Concentration: The patient appeared to maintain consistent engagement throughout the testing session, though he required frequent prompting to stay on task.  Thought Process/Content: The patient's thought process appeared tangential throughout the testing session, especially on question-based items. There were no indications of psychosis.  Additional Observations: The patient showed some difficulties with understanding task instructions, and required occasional repetition. Some difficulties with frustration tolerance were noted. The patient occasionally attempted to begin a trial before stimuli were finished being presented.   Neuropsychology Note Shawnmichael Parenteau completed 110 minutes of neuropsychological testing with technician, Josue Ned, BA, under the supervision of Evalene Riff, PsyD., Clinical Neuropsychologist. The patient did not appear overtly distressed by the testing  session, per behavioral observation or via self-report to the technician. Rest breaks were offered.   Clinical Decision Making: In considering the patient's current level of functioning, level of presumed impairment, nature of symptoms, emotional and behavioral responses during clinical interview, level of literacy, and observed level of motivation/effort, a battery of tests was selected by Dr. Riff during initial consultation on 06/09/2024. This was communicated to the technician. Communication between the neuropsychologist and technician was ongoing throughout the testing session and changes were made as deemed necessary based on patient performance on testing, technician observations and additional pertinent factors such as those listed above.  Tests Administered: Automatic Data Edition (BNT-2) Brief Visuospatial Memory Test-Revised (BVMT-R) Clock Drawing Test Controlled Oral Word Association Test (FAS & Animals) Delis-Kaplan Executive Function System (D-KEFS), select subtests Hopkins Verbal Learning Test - Revised (HVLT-R) Repeatable Battery for the Assessment of Neuropsychological Status Update (RBANS) Trail Making Test (TMT; Part A & B) Wechsler Adult Intelligence Scale-Fourth Edition (WAIS-IV), select subtests Wechsler Memory Scale-Fourth Edition (WMS-IV) , select subtests Wechsler Memory Scale-Third Edition (WMS-III), select subtests  Wechsler Test of Adult Reading (WTAR) Geriatric Depression Scale-Short Form (GDS-SF) Geriatric Anxiety Inventory (GAI)  NEUROPSYCHOLOGICAL TEST RESULTS: Note: This summary of test scores accompanies the interpretive report and should not be interpreted by unqualified individuals or in isolation without reference to the report.   Test scores are relative to age and further adjusted for educational history and demographics as available when appropriate.  Measurement properties of test scores: IQ, Index, and Standard Scores (SS): Mean = 100;  Standard Deviation = 15; Scaled Scores (ss): Mean = 10; Standard Deviation = 3; Z scores (Z): Mean = 0; Standard Deviation = 1; T scores (T); Mean = 50; Standard Deviation = 10       Feedback to Patient: Yadier Bramhall will return on 06/24/2024 for an interactive feedback session with Dr. Riff at which time his test performances, clinical impressions  and treatment recommendations will be reviewed in detail. The patient understands he can contact our office should he require our assistance before this time.  110 minutes spent face-to-face with patient administering standardized tests, *** minutes spent scoring (technician). [CPT [id number], [id number]]  Full report to follow. "

## 2024-06-20 ENCOUNTER — Encounter

## 2024-06-20 ENCOUNTER — Encounter: Admitting: Psychology

## 2024-06-24 ENCOUNTER — Encounter: Admitting: Psychology

## 2024-07-13 ENCOUNTER — Ambulatory Visit: Admitting: Family Medicine
